# Patient Record
Sex: Male | Born: 1940
Health system: Southern US, Community
[De-identification: ages and names within clinical notes are randomized; demographics above are authoritative.]

## PROBLEM LIST (undated history)

## (undated) DIAGNOSIS — G4730 Sleep apnea, unspecified: Secondary | ICD-10-CM

## (undated) DIAGNOSIS — I10 Essential (primary) hypertension: Secondary | ICD-10-CM

## (undated) DIAGNOSIS — R0600 Dyspnea, unspecified: Secondary | ICD-10-CM

## (undated) DIAGNOSIS — K5792 Diverticulitis of intestine, part unspecified, without perforation or abscess without bleeding: Secondary | ICD-10-CM

## (undated) DIAGNOSIS — J45909 Unspecified asthma, uncomplicated: Secondary | ICD-10-CM

## (undated) DIAGNOSIS — E785 Hyperlipidemia, unspecified: Secondary | ICD-10-CM

## (undated) DIAGNOSIS — T7840XA Allergy, unspecified, initial encounter: Secondary | ICD-10-CM

## (undated) DIAGNOSIS — H269 Unspecified cataract: Secondary | ICD-10-CM

## (undated) DIAGNOSIS — K219 Gastro-esophageal reflux disease without esophagitis: Secondary | ICD-10-CM

## (undated) DIAGNOSIS — K4020 Bilateral inguinal hernia, without obstruction or gangrene, not specified as recurrent: Secondary | ICD-10-CM

## (undated) HISTORY — DX: Essential (primary) hypertension: I10

## (undated) HISTORY — PX: OTHER SURGICAL HISTORY: SHX169

## (undated) HISTORY — DX: Hyperlipidemia, unspecified: E78.5

## (undated) HISTORY — DX: Diverticulitis of intestine, part unspecified, without perforation or abscess without bleeding: K57.92

## (undated) HISTORY — DX: Unspecified cataract: H26.9

## (undated) HISTORY — DX: Unspecified asthma, uncomplicated: J45.909

## (undated) HISTORY — DX: Gastro-esophageal reflux disease without esophagitis: K21.9

## (undated) HISTORY — PX: PALATE / UVULA BIOPSY / EXCISION: SUR128

## (undated) HISTORY — DX: Allergy, unspecified, initial encounter: T78.40XA

## (undated) HISTORY — PX: HERNIA REPAIR: SHX51

## (undated) HISTORY — PX: TONSILLECTOMY: SUR1361

## (undated) HISTORY — PX: VASECTOMY: SHX75

---

## 2004-04-17 ENCOUNTER — Inpatient Hospital Stay (HOSPITAL_COMMUNITY): Admission: EM | Admit: 2004-04-17 | Discharge: 2004-04-21 | Payer: Self-pay | Admitting: Emergency Medicine

## 2004-04-17 ENCOUNTER — Ambulatory Visit: Payer: Self-pay | Admitting: Internal Medicine

## 2004-05-25 ENCOUNTER — Ambulatory Visit: Payer: Self-pay | Admitting: Internal Medicine

## 2004-06-09 ENCOUNTER — Ambulatory Visit (HOSPITAL_COMMUNITY): Admission: RE | Admit: 2004-06-09 | Discharge: 2004-06-09 | Payer: Self-pay | Admitting: Internal Medicine

## 2004-06-09 ENCOUNTER — Ambulatory Visit: Payer: Self-pay | Admitting: Internal Medicine

## 2004-06-09 HISTORY — PX: COLONOSCOPY: SHX174

## 2004-09-14 ENCOUNTER — Observation Stay (HOSPITAL_COMMUNITY): Admission: RE | Admit: 2004-09-14 | Discharge: 2004-09-15 | Payer: Self-pay | Admitting: General Surgery

## 2005-04-29 ENCOUNTER — Encounter: Admission: RE | Admit: 2005-04-29 | Discharge: 2005-04-29 | Payer: Self-pay | Admitting: Neurology

## 2005-07-23 ENCOUNTER — Emergency Department (HOSPITAL_COMMUNITY): Admission: EM | Admit: 2005-07-23 | Discharge: 2005-07-23 | Payer: Self-pay | Admitting: Emergency Medicine

## 2008-07-23 ENCOUNTER — Encounter: Payer: Self-pay | Admitting: Cardiovascular Disease

## 2008-07-23 ENCOUNTER — Ambulatory Visit: Payer: Self-pay | Admitting: Cardiovascular Disease

## 2008-07-23 DIAGNOSIS — T7840XA Allergy, unspecified, initial encounter: Secondary | ICD-10-CM | POA: Insufficient documentation

## 2008-07-23 DIAGNOSIS — J45909 Unspecified asthma, uncomplicated: Secondary | ICD-10-CM | POA: Insufficient documentation

## 2008-07-23 DIAGNOSIS — I4949 Other premature depolarization: Secondary | ICD-10-CM

## 2008-07-23 DIAGNOSIS — R079 Chest pain, unspecified: Secondary | ICD-10-CM

## 2008-07-23 DIAGNOSIS — J449 Chronic obstructive pulmonary disease, unspecified: Secondary | ICD-10-CM | POA: Insufficient documentation

## 2008-08-06 ENCOUNTER — Telehealth (INDEPENDENT_AMBULATORY_CARE_PROVIDER_SITE_OTHER): Payer: Self-pay | Admitting: *Deleted

## 2008-08-07 ENCOUNTER — Encounter: Payer: Self-pay | Admitting: Cardiovascular Disease

## 2008-08-07 ENCOUNTER — Ambulatory Visit: Payer: Self-pay

## 2008-08-13 ENCOUNTER — Telehealth: Payer: Self-pay | Admitting: Cardiovascular Disease

## 2008-08-18 DIAGNOSIS — G4733 Obstructive sleep apnea (adult) (pediatric): Secondary | ICD-10-CM | POA: Insufficient documentation

## 2008-08-18 DIAGNOSIS — E876 Hypokalemia: Secondary | ICD-10-CM | POA: Insufficient documentation

## 2008-08-18 DIAGNOSIS — M549 Dorsalgia, unspecified: Secondary | ICD-10-CM | POA: Insufficient documentation

## 2008-08-18 DIAGNOSIS — R0602 Shortness of breath: Secondary | ICD-10-CM | POA: Insufficient documentation

## 2008-08-18 DIAGNOSIS — K5730 Diverticulosis of large intestine without perforation or abscess without bleeding: Secondary | ICD-10-CM | POA: Insufficient documentation

## 2008-08-19 ENCOUNTER — Ambulatory Visit: Payer: Self-pay | Admitting: Cardiovascular Disease

## 2008-08-19 DIAGNOSIS — I10 Essential (primary) hypertension: Secondary | ICD-10-CM

## 2009-01-07 ENCOUNTER — Encounter (INDEPENDENT_AMBULATORY_CARE_PROVIDER_SITE_OTHER): Payer: Self-pay | Admitting: *Deleted

## 2010-08-20 NOTE — H&P (Signed)
NAMEMANDEL, SEIDEN               ACCOUNT NO.:  [id number]   MEDICAL RECORD NO.:  [id number]          PATIENT TYPE:  AMB   LOCATION:  DAY                           FACILITY:  [facility]   PHYSICIAN:  Jerolyn Shin C. Katrinka Blazing, M.D.   DATE OF BIRTH:  1940-12-18   DATE OF ADMISSION:  DATE OF DISCHARGE:  LH                                HISTORY & PHYSICAL   A 70 year old male with a history of bilateral inguinal pain and symptomatic  left inguinal swelling.  The patient is scheduled for left inguinal hernia  repair.  He is status post right inguinal hernia repair.   PAST MEDICAL HISTORY:  1.  Chronic low back pain.  2.  History of diverticulitis.  3.  Status post treatment with IV and oral antibiotics since January, 2006.   MEDICATIONS:  1.  Allegra-D 1 daily.  2.  Singulair 10 mg daily.  3.  Ibuprofen p.r.n.   PAST SURGICAL HISTORY:  Right inguinal hernia repair and umbilical hernia  repair.   PHYSICAL EXAMINATION:  VITAL SIGNS:  Blood pressure 140/90, pulse 80,  respirations 20.  Weight 188 pounds.  HEENT:  Unremarkable.  NECK:  Supple.  No JVD, bruits, adenopathy, or thyromegaly.  CHEST:  Clear to auscultation.  HEART:  Regular rate and rhythm without murmur, rub or gallop.  ABDOMEN:  Nontender.  No masses.  EXTREMITIES:  No clubbing, cyanosis or edema.  MUSCULOSKELETAL:  Hernia:  Tender left inguinal area with a positive left  inguinal hernia.  NEUROLOGIC:  No focal motor, sensory, or cerebellar deficits.   IMPRESSION:  Left inguinal hernia.   PLAN:  Left inguinal hernia repair.     LCS/MEDQ  D:  09/13/2004  T:  09/13/2004  Job:  [id number]

## 2010-08-20 NOTE — Op Note (Signed)
Patrick, Bell               ACCOUNT NO.:  [id number]   MEDICAL RECORD NO.:  [id number]          PATIENT TYPE:  AMB   LOCATION:  DAY                           FACILITY:  [facility]   PHYSICIAN:  Lionel December, M.D.    DATE OF BIRTH:  05-Oct-1940   DATE OF PROCEDURE:  06/09/2004  DATE OF DISCHARGE:                                 OPERATIVE REPORT   PROCEDURE:  Total colonoscopy.   INDICATION:  Patrick Bell is a 70 year old Caucasian male who was hospitalized  about two months ago with lower abdominal pain and CT evidence of  diverticulitis. There was also a question of C diff colitis. He was treated  with antibiotics and has improved. He is now returning with colonoscopy to  make sure he does not have any neoplasm. He does give history of colon polyp  that was apparently not removed. This procedure was done in [place], [place]. Presently he is complaining of intermittent pain, right lower  quadrant of abdomen. He does have a right inguinal hernia, but this pain  does not appear to be associated with it.   Procedure risks were reviewed the patient, and informed consent was  obtained.   PREMEDICATION:  Demerol 40 mg IV Versed 4 mg IV.   FINDINGS:  Procedure performed in endoscopy suite. The patient's vital signs  and O2 saturation were monitored during procedure and remained stable. The  patient was placed in left lateral position. Rectal examination performed.  No abnormality noted on external or digital exam. Olympus video scope was  placed in rectum and advanced under vision into sigmoid colon and beyond.  Preparation was satisfactory. He had scattered small to medium sized  diverticula at sigmoid colon. Scope was advanced into cecum which was  identified by appendiceal orifice and ileocecal valve. Pictures taken for  the record. Short segment of TI was also examined and was normal. Colonic  mucosa was examined for the second time on the way out, and there were no  polyps,  tumor masses or colonic stricture noted. Rectal mucosa similarly was  normal. Scope was retroflexed to examine anorectal junction which was  unremarkable. Endoscope was straightened and withdrawn.   The patient tolerated procedure well.   FINAL DIAGNOSIS:  1.  Sigmoid colon diverticulosis, otherwise normal colonoscopy.  2.  Normal terminal ileoscopy.   RECOMMENDATIONS:  1.  He will continue high fiber diet. Take FiberChoice 2 tablets q.d. or      Citrucel 1 tablespoonful daily.  2.  Levsin SL t.i.d. p.r.n. prescription given for 30 with 1 refill.      NR/MEDQ  D:  06/09/2004  T:  06/09/2004  Job:  [id number]   cc:   Magnus Sinning. Dimple Casey, M.D.  [address] [place]  [place] [address]  Fax: [phone]

## 2010-08-20 NOTE — H&P (Signed)
[name], [name]               ACCOUNT NO.:  [id number]   MEDICAL RECORD NO.:  [id number]          PATIENT TYPE:  INP   LOCATION:  A332                          FACILITY:  [facility]   PHYSICIAN:  Margaretmary Dys, M.D.DATE OF BIRTH:  26-Jul-1940   DATE OF ADMISSION:  04/17/2004  DATE OF DISCHARGE:  LH                                HISTORY & PHYSICAL   ADMISSION DIAGNOSES:  1.  Acute abdominal pain.  2.  Acute diverticulitis.  3.  Hypokalemia.   CHIEF COMPLAINT:  Lower abdominal pain.   HISTORY OF THE PRESENT ILLNESS:  [name] is a 70 year old  Caucasian male with a history of hernia repair about 2.5 years ago who  presents to the emergency room with a four-day history of lower abdominal  pain.  He describes the pain as colicky, intermittent in nature and worse in  his left lower quadrant.  The pain appears to be relieved spontaneously  without any aggravating or relieving factors.  The pain also radiates to his  suprapubic and right lower quadrant.   The patient did have a hernia repair 2.5 years ago and he thought this was a  recurrence.  Actually evaluation in the emergency room did show that the  patient has a reducible inguinal hernia.  He denies any fever, chills or  rigors.  He describes the pain as about 5/10 at its worse and at the time I  spoke with him he stated the pain was down to 2/10.  He has had no nausea or  vomiting.  He states, however, that he has diarrhea suggesting that his  stools are never really well-formed.  He denies any significant  constipation.  He has no frequency, urgency or dysuria .  No headache,  dizziness or lightheadedness.  Denies any shortness of breath.  No chest  pain.  No palpitations.  He is mostly healthy and does not taken any chronic  medications.  He initially thought the pain was related to his hernia.   The patient tells that the last time he had a herniorrhaphy it was not  painful and reveals no history of  strangulation or obstruction at the time.  He has never had an episode of diverticulitis.   Evaluation in the emergency room with a CT scan of his abdomen and pelvis  did reveal the patient to have acute diverticulitis.   REVIEW OF SYSTEMS:  A ten point review of systems is otherwise negative,  except as mentioned in the history of present illness above.   PAST MEDICAL HISTORY:  1.  History of herniorrhaphy.  2.  Allergies.  3.  Asthma.  4.  Sleep apnea.   MEDICATIONS:  1.  Motrin p.r.n.  2.  Singulair.  3.  Allegra D.  4.  Albuterol inhaler.   ALLERGIES:  The patient has no known drug allergies.   FAMILY HISTORY:  Father died in an accident, but he did have an MI at the  age of 64.  Mother is alive at 68 and doing very well.  No other significant  family history for disease noted.  SOCIAL HISTORY:  The patient quit smoking years ago.  He does not drink  alcohol, except on rare occasions.  He denies any illicit drug use.  The  patient is married and has 74 year old son who is out the home.  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   PHYSICAL EXAMINATION:  GENERAL APPEARANCE:  Conscious, alert, comfortable  and very pleasant elderly Caucasian male who appears younger than his age.  VITAL SIGNS:  Blood pressure is 144/77, pulse is 97, respiratory rate of 18  and temperature is 98.6.  He weighs 187 pounds.  HEENT EXAMINATION:  Normocephalic and atraumatic.  Oral mucosa is moist with  no exudates.  NECK:  The neck is supple.  No JVD or lymphadenopathy.  LUNGS:  The lungs are clear clinically with good air entry bilaterally.  HEART:  S1 and S2 regular.  No S3, S4, gallops or rubs.  ABDOMEN:  The abdomen is soft and not distended.  The patient has tenderness  in the left lower quadrant.  There is no rebound.  There is no guarding or  rigidity noted.  Bowel sounds are positive.  The patient does have a  reducible right inguinal hernia.  EXTREMITIES:  No pitting pedal edema.   No calf induration or tenderness is  noted.  NEUROLOGIC EXAMINATION:  CNS exam is grossly intact with no focal deficit.   LABORATORY DATA:  White blood cell count was 14.8, hemoglobin of 16,  hematocrit 47.3 and platelet count was 311,000 with left shift, neutrophils  of 85%.  Sodium is 137, potassium 3.3, chloride of 97, carbon dioxide 27,  glucose of 87, BUN 10, creatinine 0.9, and calcium ____________.  Total  protein 6.6, albumin is 3.8.  AST of 6 and ALT of 15.  Total bilirubin is  1.3.  Lipase of 17.  Urinalysis was negative.  Urine microscopy was also  negative.  CT of the abdomen as per the report I obtained Dr. Margretta Ditty was  that the patient has acute diverticulitis.  I do not have the official  report yet.   ASSESSMENT AND PLAN:  [age]-year-old Caucasian male with left lower  quadrant abdominal pain, leukocytosis and computerized tomographic findings  suggestive of acute diverticulitis.   Plan:  1.  We will admit him to 3A at this time.  2.  We will continue with ciprofloxacin 400 mg IV q.12 and Flagyl 500 mg      q.8.  3.  The patient does not have any signs or symptoms suggestive of an acute      abdomen requiring surgical intervention at this time, but we will      continued to monitor him.  I have informed him of the possible risks      associated with continued inflammation.  It does appear the patient is      doing much better.  He also does not have any significant comorbid      factors that may make his recovery difficult.  4.  I anticipate that patient may have intravenous antibiotics for two to      three days and subsequently will be      discharged for close follow up on oral antibiotics.  5.  The patient will need surgical evaluation for his hernia when his      infection clears up completely.  6.  I will correct the potassium.     Ayor   AM/MEDQ  D:  04/17/2004  T:  04/18/2004  Job:  [id number]

## 2010-08-20 NOTE — Consult Note (Signed)
Patrick Bell, Patrick Bell               ACCOUNT NO.:  [id number]   MEDICAL RECORD NO.:  [id number]          PATIENT TYPE:  INP   LOCATION:  A332                          FACILITY:  [facility]   PHYSICIAN:  Dirk Dress. Katrinka Blazing, M.D.   DATE OF BIRTH:  01-Sep-1940   DATE OF CONSULTATION:  04/18/2004  DATE OF DISCHARGE:                                   CONSULTATION   A 70 year old male admitted on 14 January with complaint of left lower  quadrant and suprapubic pain.  By history, the patient states that he has  had intermittent lower abdominal pain for at least three weeks.  He states  that he stretched, had some discomfort in his suprapubic area and slightly  to the right of midline about three weeks ago.  He also had symptoms of  tumescence at that time.  He noted some urinary frequency without urgency.  The patient's symptoms became much worse on 14 January.  He was seen in the  outpatient clinic at [facility] and was felt to have  possible right inguinal hernia or appendicitis.  He was referred to the  emergency room for evaluation, and it was noted that he had a tender lower  abdomen, especially in the suprapubic area and the left lower quadrant.  CT  of the abdomen confirms diverticulitis of the sigmoid colon without evidence  of abscess, free air, or major mesenteric inflammation.  The patient has  been started on treatment, and he has improved.  He is concerned that he is  going to have significant hospitalization, but he is advised that non-  operative treatment with antibiotics is the preferred treatment for his  first major episode in spite of the fact that he might have had some small  episodes that were treated with oral antibiotics in the past.   PAST MEDICAL HISTORY:  The patient has asthma, sleep apnea.   MEDICATIONS:  His only medications are Motrin p.r.n. for pain,  Allegra D  b.i.d. p.r.n., Singulair 10 mg daily, and albuterol inhaler.   PAST SURGICAL  HISTORY:  Left inguinal hernia repair in the past.  He has had  intermittent pain in his right groin with swelling in his right groin for  quite some time.  He has a documented right inguinal hernia.   SOCIAL HISTORY:  He is married.  He is still employed by a trucking company.  He has two children.  He does not drink or smoke at this time.   FAMILY HISTORY:  Positive for atherosclerotic heart disease.  His mother is  alive at age 73 without any major medical illnesses.   PHYSICAL EXAMINATION:  GENERAL:  He is a pleasant male in no acute distress.  VITAL SIGNS:  Blood pressure 130/90, pulse 80, respirations 16, temperature  99.7.  HEENT:  Unremarkable.  NECK:  Supple.  CHEST:  Clear.  HEART:  Regular rate and rhythm without murmur, gallop, or rub.  ABDOMEN:  Nondistended with moderate tenderness to palpation in the  suprapubic and left lower quadrant.  There is some mild rebound tenderness  in the  right lower quadrant, but this is more referred discomfort.  He has  good, active bowel sounds.  HERNIA:  There is tenderness in the right inguinal area, but there is no  palpable hernia, though he has increased bulge in the right inguinal area  with cough.  EXTREMITIES:  No cyanosis, clubbing, or edema.  NEUROLOGIC:  No focal motor, sensory, or cerebellar deficits.   LABORATORY DATA AND OTHER STUDIES:  CT scan shows inflammation of the mid to  distal sigmoid colon with thickening of the wall of the colon without  evidence of major mesenteric inflammation and without evidence of  significant adenopathy.  There does not appear to be any free fluid that I  can see, and the radiologist does not mention any.   Initial white count 14,800, hemoglobin 16, hematocrit 47.3, 85% segs, 7%  lymphs.  Serum potassium 3.3, BUN 10, creatinine 0.9, CO2 27.  Liver panel  is normal.  Lipase is normal.  Urinalysis negative for infection or  bacteria.   IMPRESSION:  1.  Acute sigmoid diverticulitis,  presently controlled.  2.  Mild hypokalemia.  3.  Right inguinal hernia.   PLAN:  1.  The patient should be treated with minimum of 4 days with IV antibiotics      to ensure the start of good resolution of the inflammatory process.  2.  A CT scan should be done on the third or fourth day to confirm      improvement.  3.  If improvement is confirmed, he should be discharged home and treated      for two to three weeks with oral antibiotics and should have a followup      CT in six weeks.  4.  If he does well with this acute attack of diverticulitis, then we can      proceed and have his right inguinal hernia repaired electively.  5.  If his symptoms should worsen over the next day or so, he should have an      urgent CT and have urgent operation because this will mean that he has      progression of the inflammatory process.     Lero   LCS/MEDQ  D:  04/18/2004  T:  04/18/2004  Job:  [id number]   cc:   [facility]

## 2010-08-20 NOTE — Group Therapy Note (Signed)
Patrick Bell, Patrick Bell               ACCOUNT NO.:  [id number]   MEDICAL RECORD NO.:  [id number]          PATIENT TYPE:  INP   LOCATION:  A332                          FACILITY:  [facility]   PHYSICIAN:  Margaretmary Dys, M.D.DATE OF BIRTH:  May 14, 1940   DATE OF PROCEDURE:  04/18/2004  DATE OF DISCHARGE:                                   PROGRESS NOTE   SUBJECTIVE:  The patient feels much better this morning.  He says the pain  is better controlled, although he still rates it about 6-7/10, especially  when he tries to sit up.  He denies any nausea or vomiting.  The patient was  actually preparing to eat.  He has no fever, chills, or rigors.  No  shortness of breath, no chest pain.   OBJECTIVE:  GENERAL:  The patient is a lot more comfortable, not in acute  distress.  VITAL SIGNS:  Blood pressure is 136/85, pulse of 75, respirations of 20,  temperature of 97.4.  HEENT:  Normocephalic, atraumatic.  Oral mucosa was moist with no exudates.  NECK:  Supple, no JVD, no lymphadenopathy.  LUNGS:  Clear clinically, with good air entry bilaterally.  HEART:  S1, S2 regular.  No S3, S4, gallops or rubs.  ABDOMEN:  The patient still has tenderness in the left lower quadrant;  otherwise was mostly soft.  No rigidity, no guarding.  Bowel sounds were  positive.  EXTREMITIES:  No pitting pedal edema, no calf induration or tenderness.  CENTRAL NERVOUS SYSTEM:  Grossly intact with no focal deficits.   LABORATORY DATA:  White blood cell count is 7.9, hemoglobin 13.9, hematocrit  40.7, platelet count is 257.  There is no left shift.  Sodium is 131,  potassium is 3.8, chloride of 103, CO2 is 23.  BUN of 7, creatinine is 0.8.   ASSESSMENT AND PLAN:  Patrick Bell was admitted yesterday with a  diagnosis of acute diverticulitis, found on a CT scan of his abdomen.  The  official report is still pending.  The patient appears to be much better  this morning.  He is tolerating p.o. without significant  exacerbation of his  pain.  We will continue him on intravenous ciprofloxacin and metronidazole  for now.  Continue deep venous thrombosis prophylaxis and gastrointestinal  prophylaxis.   We will obtain surgical consult so the patient can establish some kind of  followup.  The patient did inform me that he actually had a similar episode  about three months ago, and was told that it was probably the same  diverticulitis.  He received two weeks of oral antibiotics, but never went  back for a followup.  So, the patient has had two episodes in the last three  months.  He might need further evaluation with a colonoscopy if he has  recurrent episodes.   We will switch his IV fluids to [facility] this morning.   I anticipate discharge in the next 1-3 days.     Ayor   AM/MEDQ  D:  04/18/2004  T:  04/18/2004  Job:  [id number]

## 2010-08-20 NOTE — Op Note (Signed)
NAMEIGNATZ, DEIS               ACCOUNT NO.:  [id number]   MEDICAL RECORD NO.:  [id number]          PATIENT TYPE:  AMB   LOCATION:  DAY                           FACILITY:  [facility]   PHYSICIAN:  Jerolyn Shin C. Katrinka Blazing, M.D.   DATE OF BIRTH:  June 02, 1940   DATE OF PROCEDURE:  09/14/2004  DATE OF DISCHARGE:                                 OPERATIVE REPORT   PREOPERATIVE DIAGNOSIS:  Left inguinal hernia.   POSTOPERATIVE DIAGNOSIS:  Left inguinal hernia.   OPERATION PERFORMED:  Left inguinal hernia repair with mesh.   SURGEON:  Dirk Dress. Katrinka Blazing, M.D.   ANESTHESIA:  General.   DESCRIPTION OF PROCEDURE:  Under general anesthesia, the patient's inguinal  area and genitalia were prepped and draped in a sterile field.  A  curvilinear lower inguinal incision was made.  The incision extended through  to the aponeurosis of the external oblique.  The aponeurosis was opened in  line with its fibers.  The cord was mobilized from the floor.  Exploration  of the cord revealed a small indirect sac.  This was dissected back to the  internal ring and invaginated.  A medium plug was placed.  The plug was  sutured to the surrounding tissue with interrupted 0 Prolene.  An onlay  patch was then placed over the floor.  The onlay patch, how was used as a  primary repair with sutures starting at the pubic tubercle extending down to  Cooper's ligament and onto the iliopubic tract followed by suture to the  conjoined tendon.  This was done with 0 Prolene.  Because part of the direct  component pushed through the opening in the onlay patch, another medium plug  was placed to invert the direct component.  This was sutured to the  surrounding tissue and to the onlay patch to prevent herniation in this  area.  The ilioinguinal and iliohypogastric nerves were identified and  preserved during the procedure.  The cord was placed in anatomic position.  The aponeurosis was closed with 3-0 Monocryl.  Subcutaneous tissue was  closed with 3-0 Monocryl.  Skin  was closed with subcuticular 4-0 Dexon.  Local infiltration with 0.5%  Marcaine with epinephrine was carried out.  Dressing was placed.  The  patient tolerated the procedure well.  He was awakened from anesthesia,  transferred to a bed and taken to the post anesthesia care unit for further  monitoring.       LCS/MEDQ  D:  09/14/2004  T:  09/14/2004  Job:  [id number]   cc:   Magnus Sinning. Dimple Casey, M.D.  [address] [place]  [place] [address]  Fax: [phone]

## 2010-08-20 NOTE — Consult Note (Signed)
NAMEFLORA, Patrick Bell               ACCOUNT NO.:  [id number]   MEDICAL RECORD NO.:  [id number]          PATIENT TYPE:  INP   LOCATION:  A332                          FACILITY:  [facility]   PHYSICIAN:  Lionel December, M.D.    DATE OF BIRTH:  09-28-1940   DATE OF CONSULTATION:  04/20/2004  DATE OF DISCHARGE:                                   CONSULTATION   REASON FOR CONSULTATION:  Diverticulitis, diarrhea.   REQUESTING PHYSICIAN:  Calvert Cantor, M.D.   HISTORY OF PRESENT ILLNESS:  The patient is a 70 year old Caucasian  gentleman who gives a two to three-week history of lower abdominal pain.  The pain has been described as colicky.  Over the last week, it had been  more progressive.  The day prior to admission, he noted increased pain.  He  saw his primary care physician the day of admission who advised him to go to  the ED.  It was felt that pain may be related to hernia.  He does have a  history of right inguinal hernia repair and developed another right inguinal  hernia near the previous incision.   Upon arrival, he had a CT of the abdomen and pelvis which revealed wall  thickening of the proximal sigmoid colon with adjacent inflammation  consistent with diverticulitis.  He had a white count of 14,800.  It was  felt his symptoms were due to diverticulitis.  He was placed on IV Cipro and  Flagyl.  Since admission, he has notably had diarrhea.  He has had 8 to 9  stools daily.  Stools are loose, watery, and very dark in appearance.  He  has been hemoccult negative x 1.  Yesterday he switched to Unasyn from IV  Cipro and IV Flagyl as it was felt this may be causing his diarrhea.   Clinically, he has improved.  He was started on advanced diet and has  tolerated that.  He denies any rectal bleeding, nausea or vomiting,  heartburn.  He denies any prior episodes of diverticulitis.  He does not if  he has diverticulosis.  He had a colonoscopy a couple of years ago, but only  the only thing he  recalls is that he had a polyp removed.  At baseline, he  typically has two to three soft, mucosy stools daily.  He has never had  constipation.  He has had fecal urgency for about 10 to 12 years.  He  reports antibiotic use two to three months ago for a urological infection.   MEDICATIONS PRIOR TO ADMISSION:  1.  Motrin 8 daily.  2.  Singulair.  3.  Allegra D.  4.  Albuterol inhaler p.r.n.   ALLERGIES:  No known drug allergies.   PAST MEDICAL HISTORY:  1.  History of sleep apnea status post removal of uvula.  He does not      require a CPAP machine.  2.  History of asthma.  3.  Right inguinal hernia repair a couple of years ago; he has another right      inguinal hernia.  4.  He also  had umbilical hernia repair eight years ago.  5.  Tonsillectomy.   FAMILY HISTORY:  Negative for colorectal cancer or chronic GI illnesses.   SOCIAL HISTORY:  He is married and has a son.  He is a Naval architect.  He  quit smoking 20 years ago.  Denies any alcohol or drug use.   REVIEW OF SYSTEMS:  See HPI for GI.  GU:  Denies any dysuria or hematuria.  CARDIOPULMONARY:  Denies any chest pain or shortness of breath.  CONSTITUTIONAL:  Denies any weight loss.   PHYSICAL EXAMINATION:  VITAL SIGNS:  Temperature 97, pulse 84, respirations  20, blood pressure 154/73.  GENERAL:  Pleasant, well-nourished, well-developed, Caucasian male in no  acute distress.  SKIN:  Warm and dry, no jaundice.  HEENT:  Conjunctivae pink.  Sclerae nonicteric.  Oropharyngeal mucosa moist  and pink, no lesions, erythema, or exudate.  NECK:  No lymphadenopathy or thyromegaly.  CHEST: Clear to auscultation.  CARDIAC:  Regular rate and rhythm.  Normal S1 and S2.  No murmurs, rubs, or  gallops.  ABDOMEN:  Positive bowel sounds, hyperactive.  Abdomen is very soft and  nondistended.  He has mild suprapubic tenderness to deep palpation in the  left lower quadrant region.  No rebound tenderness or guarding.  No  abdominal  herniation appreciated.  EXTREMITIES:  No edema.   LABORATORY DATA AND OTHER STUDIES:  Labs from yesterday:  White count 8600,  hemoglobin 13.9, hematocrit 40.6, platelets 277,000.  Sodium 131, potassium  3.4, BUN 3, creatinine 0.8, glucose 92.  On admission, total bilirubin was  1.3, slightly elevated, alkaline phosphatase 75, AST 16, ALT 15, albumin  3.8.  Lipase 17.   IMPRESSION:  The patient is a 70 year old gentleman with lower abdominal  pain, diarrhea.  Based on CT, has evidence of wall thickening of the  proximal sigmoid colon with adjacent inflammation.  Typically, do not see  diarrhea in association with acute diverticulitis.  Would question if this  is diverticulitis with superimposed colitis.  C. difficile toxin titer is  pending as well as fecal white blood cells.   The patient is clinically improved at this point with regards to abdominal  pain, although diarrhea is quite persistent.  Would recommend oral Cipro and  Flagyl for two weeks at this point.  As long as he clinically continues to  improve, would consider colonoscopy in four to six weeks to verify  resolution of symptoms and to make sure there are no other underlying  illnesses.   PLAN:  1.  Will switch his antibiotics to oral Flagyl and start the Flagyl 250 mg      p.o. four times a day and Cipro 500 mg p.o. b.i.d.  Will discontinue      Unasyn.  2.  Will try to obtain copy of colonoscopy report and biopsy results from      [facility] in [place], [place], which was done approximately two years ago.  3.  Will repeat MET-7, CBC, and LFTs in the morning.  4.  Dr. Karilyn Cota has reviewed CT and will be seeing patient and making further      recommendations.      LL/MEDQ  D:  04/20/2004  T:  04/20/2004  Job:  [id number]   cc:   Calvert Cantor, M.D.

## 2010-08-20 NOTE — H&P (Signed)
Patrick Bell, Patrick Bell               ACCOUNT NO.:  [id number]   MEDICAL RECORD NO.:  [id number]          PATIENT TYPE:  AMB   LOCATION:                                 FACILITY:   PHYSICIAN:  Lionel December, M.D.    DATE OF BIRTH:  02-08-1941   DATE OF ADMISSION:  DATE OF DISCHARGE:  LH                                HISTORY & PHYSICAL   REASON:  Colonoscopy. Followup diverticulitis/clostridium difficile colitis.   HISTORY OF PRESENT ILLNESS:  Patrick Bell is a 70 year old Caucasian male  patient who was hospitalized on April 20, 2004. At that time, he had lower  abdominal pain and diarrhea. CT showed evidence of wall thickening at the  proximal sigmoid colon with adjacent inflammation compatible with  diverticulitis. The patient was treated with Cipro and Flagyl and  subsequently developed mucosy diarrhea. Antibiotic was then changed to  Unasyn at that point as it was felt that he may have developed clostridium  difficile or pseudomembranous colitis. He was then treated with two weeks of  Cipro and Flagyl as well as yogurt with each meal. He has continued to do  well since hospital discharge. He is noting soft, semiformed stools about  twice a day. He denies any abdominal pain, fever, or chills. He denies any  rectal bleeding or melena. He denies any nausea or vomiting. He also notes  history of taking about 2,400 mg of ibuprofen on a daily basis for the last  year and a half prior to his hospitalization. It was noted that he had  colonoscopy between two and three years ago in [place]. We have been unable  to obtain this report. He reportedly has had a polyp removed, unsure of the  pathology report on this. He denies any history of diverticulitis in the  past.   PAST MEDICAL HISTORY:  1.  Sleep apnea status post removal of uvula.  2.  Asthma.  3.  Right inguinal hernia repair.  4.  Umbilical hernia repair.  5.  Tonsillectomy.   CURRENT MEDICATIONS:  1.  Allegra D once daily.  2.  Singulair daily.   ALLERGIES:  No known drug allergies.   FAMILY HISTORY:  Negative for colorectal carcinoma, liver, or chronic GI  problems.   SOCIAL HISTORY:  Patrick Bell is married and does have one son. 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 Denies any alcohol or drug use.   REVIEW OF SYSTEMS:  CONSTITUTIONAL:  See HPI. CARDIOVASCULAR:  Denies any  chest pain or palpitations. PULMONARY:  Denies any shortness of breath,  dyspnea, cough, or hemoptysis. GASTROINTESTINAL:  See HPI.   PHYSICAL EXAMINATION:  VITAL SIGNS:  Weight 191.5 pounds which is stable,  temperature 97.6, blood pressure 150/100, pulse 88.  GENERAL:  Patrick Bell is a well-developed, well-nourished, Caucasian male in  no acute distress. He is alert, oriented, pleasant, and cooperative.  HEENT:  Sclerae are clear and nonicteric. Conjunctivae pink. Oropharynx  moist without any lesions.  NECK:  Supple without masses or thyromegaly.  CHEST:  Heart regular rate and rhythm with  normal S1 and S2 without any  murmurs, clicks, rubs, or gallops.  LUNGS:  Clear to auscultation bilaterally.  ABDOMEN:  Positive bowel sounds x4. Soft, nontender, nondistended, without  palpable mass or hepatosplenomegaly. No rebound tenderness or guarding.  EXTREMITIES:  2+ pedal pulses bilaterally. No edema.  SKIN:  Pink, warm, and dry without rash or jaundice.   IMPRESSION:  Patrick Bell is a 70 year old Caucasian male with recent episode  of acute wall thickening of the proximal sigmoid colon with adjacent  inflammation suspicious for acute diverticulitis. Post antibiotic treatment,  he continued to develop diarrhea which was felt to be antibiotic induced,  possibly clostridium difficile colitis. He has completed his course of Cipro  and Flagyl and is doing quite well. Now that antibiotic therapy is completed  and he is feeling much better, it would be appropriate to obtain a survey of  his colon to determine whether we are  dealing with a bout of diverticulitis  and whether this has resolved versus colitis, pseudomembranous colitis or  idiopathic colitis. Also, given his history, he is at risk for NSAID-induced  colitis as well.   RECOMMENDATIONS:  1.  He can use Tylenol instead of ibuprofen at this time.  2.  Will schedule colonoscopy with Dr. Karilyn Cota in the next couple of weeks.  3.  Further recommendations pending procedure. Also, he is asked to inform      our office if he develops fever, rectal bleeding, severe abdominal pain,      or any new signs or symptoms.      KC/MEDQ  D:  05/25/2004  T:  05/25/2004  Job:  [id number]

## 2010-08-20 NOTE — Discharge Summary (Signed)
NAMEDAMONTE, FRIESON               ACCOUNT NO.:  [id number]   MEDICAL RECORD NO.:  [id number]          PATIENT TYPE:  INP   LOCATION:  A332                          FACILITY:  [facility]   PHYSICIAN:  Calvert Cantor, M.D.     DATE OF BIRTH:  [birth year]   DATE OF ADMISSION:  04/17/2004  DATE OF DISCHARGE:  01/18/2006LH                                 DISCHARGE SUMMARY   PRIMARY CARE PHYSICIAN:  The patient sees a P.A. by the name of Montey Hora  at [facility].   DISCHARGE DIAGNOSES:  1.  Diverticulitis.  2.  Diarrhea with mucus, most likely secondary to Clostridium difficile.  3.  Right-sided inguinal hernia, status post right-sided inguinal hernia      repair.   PAST MEDICAL HISTORY:  1.  Asthma.  2.  Sleep apnea, status post removal of his uvula.  3.  History of umbilical hernia repair.  4.  History of tonsillectomy.   DISCHARGE MEDICATIONS:  1.  Flagyl 250 mg by mouth every six hours.  2.  Ciprofloxacin 500 mg by mouth every 12 hours for duration of two weeks.   HOSPITAL COURSE:  The patient is a 70 year old white male who came into the  hospital for increasing lower abdominal pain.  He had had this pain for  about two or three weeks.  The day before admission, it became very severe  and colicky.  In the emergency room, a CAT scan of his abdomen was done  which showed proximal sigmoid colon wall thickening and adjacent  inflammation compatible with diverticulitis and also mild prostate  enlargement.  The patient was initially treated with ciprofloxacin and  Flagyl.  Subsequently he developed mucousy diarrhea.  Antibiotics were  changed to Unasyn because it was felt that the antibiotics might be causing  his diarrhea.  Stool was sent for Clostridium difficile, white blood cells  and occult blood which were all negative.  However, his diarrhea is most  likely secondary to Clostridium difficile.  The stool culture is most likely  negative because of his  previous treatment with Flagyl.  He is going to be  switched back to Cipro and Flagyl for two weeks' duration and he is to see  Dr. Dionicia Abler after two weeks. Dr. Dionicia Abler is also advising one packet of yogurt  with each meal and a low residue diet. In addition to this, he is to drink  about six to eight glasses of water because he is still having some  diarrhea.  He is also to follow up with Dr. Katrinka Blazing, who had been seeing him  while he was here, for surgery of his right inguinal hernia.    Saim  SR/MEDQ  D:  04/21/2004  T:  04/21/2004  Job:  [id number]

## 2011-01-06 ENCOUNTER — Other Ambulatory Visit: Payer: Self-pay | Admitting: Family Medicine

## 2011-01-06 DIAGNOSIS — R413 Other amnesia: Secondary | ICD-10-CM

## 2011-01-07 ENCOUNTER — Other Ambulatory Visit: Payer: Self-pay

## 2011-01-27 ENCOUNTER — Ambulatory Visit
Admission: RE | Admit: 2011-01-27 | Discharge: 2011-01-27 | Disposition: A | Discharge status: Home / Self Care | Payer: Managed Care, Other (non HMO) | Source: Ambulatory Visit | Attending: Family Medicine | Admitting: Family Medicine

## 2011-01-27 DIAGNOSIS — R413 Other amnesia: Secondary | ICD-10-CM

## 2012-06-18 ENCOUNTER — Other Ambulatory Visit: Payer: Self-pay | Admitting: Physician Assistant

## 2012-06-19 LAB — COMPLETE METABOLIC PANEL WITH GFR
ALT: 13 U/L (ref 0–53)
AST: 14 U/L (ref 0–37)
CO2: 28 mEq/L (ref 19–32)
Creat: 0.91 mg/dL (ref 0.50–1.35)
GFR, Est African American: >89 mL/min
Total Bilirubin: 0.8 mg/dL (ref 0.3–1.2)

## 2012-06-19 LAB — VITAMIN D 25 HYDROXY (VIT D DEFICIENCY, FRACTURES): Vit D, 25-Hydroxy: 24 ng/mL — ABNORMAL LOW (ref 30–89)

## 2012-06-21 LAB — NMR LIPOPROFILE WITH LIPIDS
HDL Particle Number: 37.2 umol/L (ref >=30.5)
HDL-C: 62 mg/dL (ref >=40)
LDL (calc): 86 mg/dL (ref <100)
LDL Particle Number: 1199 nmol/L — ABNORMAL HIGH (ref <1000)
LDL Size: 20.5 nm — ABNORMAL LOW (ref >20.5)
LP-IR Score: <25 (ref <=45)
VLDL Size: 39.4 nm (ref >=46.6)

## 2012-06-22 ENCOUNTER — Ambulatory Visit (INDEPENDENT_AMBULATORY_CARE_PROVIDER_SITE_OTHER): Payer: Medicare Other | Admitting: Nurse Practitioner

## 2012-06-22 ENCOUNTER — Telehealth: Payer: Self-pay | Admitting: Physician Assistant

## 2012-06-22 ENCOUNTER — Encounter: Payer: Self-pay | Admitting: Nurse Practitioner

## 2012-06-22 VITALS — BP 127/74 | HR 85 | Temp 97.6°F | Ht 69.0 in | Wt 189.0 lb

## 2012-06-22 DIAGNOSIS — J069 Acute upper respiratory infection, unspecified: Secondary | ICD-10-CM

## 2012-06-22 MED ORDER — AMOXICILLIN 875 MG PO TABS: 875.0000 mg | ORAL_TABLET | Freq: Two times a day (BID) | ORAL | Status: DC

## 2012-06-22 NOTE — Patient Instructions (Signed)

## 2012-06-22 NOTE — Progress Notes (Signed)
  Subjective:    Patient ID: Patrick Bell, male    DOB: 07-30-1940, 72 y.o.   MRN: [id number]  HPI Patient in complaining of cough and congestion . Started 4day. Has gotten worse since started. Associated symptoms include Ears stopped up and  occassional headache. He has tried Sudafed and cold meds OTC without relief.     Review of Systems  Constitutional: Negative.   HENT: Positive for hearing loss (Right), congestion, rhinorrhea, voice change (hoarse), postnasal drip and sinus pressure.   Eyes: Negative.   Respiratory: Positive for cough (productive yellowish) and shortness of breath. Negative for wheezing.   Cardiovascular: Negative.   Skin: Negative.   Psychiatric/Behavioral: Negative.        Objective:   Physical Exam  Vitals reviewed. Constitutional: He appears well-developed and well-nourished.  HENT:  Right Ear: Hearing, external ear and ear canal normal. Tympanic membrane is erythematous and bulging. A middle ear effusion is present.  Left Ear: Hearing, tympanic membrane, external ear and ear canal normal. Tympanic membrane is not erythematous and not bulging.  Nose: Mucosal edema and rhinorrhea present. Right sinus exhibits no maxillary sinus tenderness and no frontal sinus tenderness. Left sinus exhibits no maxillary sinus tenderness and no frontal sinus tenderness.  Mouth/Throat: Mucous membranes are pale and dry. Posterior oropharyngeal edema and posterior oropharyngeal erythema present.  Eyes: Pupils are equal, round, and reactive to light.  Neck: Normal range of motion. Neck supple.  Cardiovascular: Normal rate, regular rhythm and normal heart sounds.   Pulmonary/Chest: Effort normal.  Skin: Skin is warm and dry.  BP 127/74  Pulse 85  Temp(Src) 97.6 F (36.4 C) (Oral)  Ht 5\' 9"  (1.753 m)  Wt 189 lb (85.73 kg)  BMI 27.9 kg/m2         Assessment & Plan:  URI  Force fluids  Rest  Motrin or tylenol OTC fever  Antibiotic as rx  Mucinex D  OTC  Mary-Margaret Daphine Deutscher, FNP

## 2012-06-22 NOTE — Telephone Encounter (Signed)
appt made

## 2012-06-22 NOTE — Progress Notes (Signed)
Quick Note:  Labs were within normal limits ______ 

## 2012-06-22 NOTE — Telephone Encounter (Signed)
WTBS today °

## 2012-06-25 ENCOUNTER — Telehealth: Payer: Self-pay | Admitting: *Deleted

## 2012-06-25 NOTE — Telephone Encounter (Signed)
Message copied by Gwenith Daily on Mon Jun 25, 2012  8:20 AM ------      Message from: Horald Pollen      Created: Fri Jun 22, 2012  8:19 AM       Vit D 2000 iu per day ------

## 2012-06-25 NOTE — Telephone Encounter (Signed)
Left message for patient to return call about lab results ?

## 2012-06-25 NOTE — Telephone Encounter (Signed)
Message copied by Gwenith Daily on Mon Jun 25, 2012  8:21 AM ------      Message from: Horald Pollen      Created: Fri Jun 22, 2012  8:18 AM       Labs were within normal limits ------

## 2012-07-13 ENCOUNTER — Encounter: Payer: Self-pay | Admitting: Physician Assistant

## 2012-07-26 ENCOUNTER — Other Ambulatory Visit: Payer: Self-pay | Admitting: Nurse Practitioner

## 2012-07-26 ENCOUNTER — Telehealth: Payer: Self-pay | Admitting: *Deleted

## 2012-07-26 MED ORDER — PROMETHAZINE HCL 12.5 MG PO TABS: 12.5000 mg | ORAL_TABLET | Freq: Four times a day (QID) | ORAL | Status: DC | PRN

## 2012-07-26 NOTE — Telephone Encounter (Signed)
N/v/d- started yesterday- cant keep anything down- Can we call in [facility] pharmacy - some phenergan something for nausea nkda Also having back muscle spasms- had muscle relaxer- but ran out if we can call that in too- that would be great

## 2012-07-26 NOTE — Telephone Encounter (Signed)
Called in phenergan to [facility] because [facility] pharmacy called

## 2012-07-27 ENCOUNTER — Telehealth: Payer: Self-pay | Admitting: Nurse Practitioner

## 2012-07-27 NOTE — Telephone Encounter (Signed)
Patient aware.

## 2012-07-27 NOTE — Telephone Encounter (Signed)
Spoke with patients wife and told her that [facility] called in rx for phenergan. If the vomiting continues wife was told to take to ER for evaluation.

## 2012-07-31 ENCOUNTER — Encounter: Payer: Self-pay | Admitting: Internal Medicine

## 2012-08-02 ENCOUNTER — Encounter: Payer: Self-pay | Admitting: Gastroenterology

## 2012-08-02 ENCOUNTER — Ambulatory Visit (INDEPENDENT_AMBULATORY_CARE_PROVIDER_SITE_OTHER): Payer: Medicare Other | Admitting: Gastroenterology

## 2012-08-02 VITALS — BP 126/76 | HR 73 | Temp 98.4°F | Ht 70.0 in | Wt 188.2 lb

## 2012-08-02 DIAGNOSIS — K219 Gastro-esophageal reflux disease without esophagitis: Secondary | ICD-10-CM

## 2012-08-02 MED ORDER — PANTOPRAZOLE SODIUM 40 MG PO TBEC: 40.0000 mg | DELAYED_RELEASE_TABLET | Freq: Every day | ORAL | Status: DC

## 2012-08-02 NOTE — Patient Instructions (Addendum)
Start taking Protonix each morning, 30 minutes before breakfast.   Please review the GERD diet provided, and call if you do not have any improvement.   We will see you back in 3 months!  Diet for Gastroesophageal Reflux Disease, Adult Reflux (acid reflux) is when acid from your stomach flows up into the esophagus. When acid comes in contact with the esophagus, the acid causes irritation and soreness (inflammation) in the esophagus. When reflux happens often or so severely that it causes damage to the esophagus, it is called gastroesophageal reflux disease (GERD). Nutrition therapy can help ease the discomfort of GERD. FOODS OR DRINKS TO AVOID OR LIMIT  Smoking or chewing tobacco. Nicotine is one of the most potent stimulants to acid production in the gastrointestinal tract.  Caffeinated and decaffeinated coffee and black tea.  Regular or low-calorie carbonated beverages or energy drinks (caffeine-free carbonated beverages are allowed).   Strong spices, such as black pepper, white pepper, red pepper, cayenne, curry powder, and chili powder.  Peppermint or spearmint.  Chocolate.  High-fat foods, including meats and fried foods. Extra added fats including oils, butter, salad dressings, and nuts. Limit these to less than 8 tsp per day.  Fruits and vegetables if they are not tolerated, such as citrus fruits or tomatoes.  Alcohol.  Any food that seems to aggravate your condition. If you have questions regarding your diet, call your caregiver or a registered dietitian. OTHER THINGS THAT MAY HELP GERD INCLUDE:   Eating your meals slowly, in a relaxed setting.  Eating 5 to 6 small meals per day instead of 3 large meals.  Eliminating food for a period of time if it causes distress.  Not lying down until 3 hours after eating a meal.  Keeping the head of your bed raised 6 to 9 inches (15 to 23 cm) by using a foam wedge or blocks under the legs of the bed. Lying flat may make symptoms  worse.  Being physically active. Weight loss may be helpful in reducing reflux in overweight or obese adults.  Wear loose fitting clothing EXAMPLE MEAL PLAN This meal plan is approximately 2,000 calories based on https://www.bernard.org/ meal planning guidelines. Breakfast   cup cooked oatmeal.  1 cup strawberries.  1 cup low-fat milk.  1 oz almonds. Snack  1 cup cucumber slices.  6 oz yogurt (made from low-fat or fat-free milk). Lunch  2 slice whole-wheat bread.  2 oz sliced [place].  2 tsp mayonnaise.  1 cup blueberries.  1 cup snap peas. Snack  6 whole-wheat crackers.  1 oz string cheese. Dinner   cup Balzarini rice.  1 cup mixed veggies.  1 tsp olive oil.  3 oz grilled fish. Document Released: 03/21/2005 Document Revised: 06/13/2011 Document Reviewed: 02/04/2011 [facility] Patient Information 2013 [place], [place].

## 2012-08-02 NOTE — Progress Notes (Signed)
Primary Care Physician:  Rudi Heap, MD Primary Gastroenterologist:  Dr. Darrick Penna   Chief Complaint  Patient presents with  . Gastrophageal Reflux  . Emesis    HPI:  Patrick Bell is a very pleasant 72 year old male, appearing much younger than his stated age, presenting as a self-referral due to GERD.  For 3 years, has had reflux. Takes Prilosec. Symptomatic without Prilosec. Would take for 14 days then stop. 2 weeks ago, +N/V. Throwing up bile for several days. Wants to know what he can take to control GERD.   Drinks 6 cups of coffee per day. Lots of [employer]. No dysphagia, no loss of appetite, but he has lost 10 lbs in last 6 months. Unintentional. "just happening" because changing eating habits. Was about 196 a few weeks ago, now 189.   Hx of chronic diarrhea, which is now improved. Taking probiotics, eating Activia. Eats a lot of ice cream. Last colonoscopy in 2005, due for routine screening in 2015.   Past Medical History  Diagnosis Date  . Asthma   . Hypertension   . Hyperlipidemia   . Diverticulitis     Past Surgical History  Procedure Laterality Date  . Hernia repair  09/14/2004  . Throat surgery    . Colonoscopy  06/09/2004    [id number] colon diverticulosis, otherwise normal colonoscopy    Current Outpatient Prescriptions  Medication Sig Dispense Refill  . albuterol (PROVENTIL HFA;VENTOLIN HFA) 108 (90 BASE) MCG/ACT inhaler Inhale 2 puffs into the lungs every 6 (six) hours as needed for wheezing.      Marland Kitchen amLODipine (NORVASC) 5 MG tablet Take 5 mg by mouth daily.      Marland Kitchen doxycycline (MONODOX) 100 MG capsule Take 100 mg by mouth 2 (two) times daily.       . fexofenadine (ALLEGRA) 30 MG tablet Take 30 mg by mouth 2 (two) times daily.      . montelukast (SINGULAIR) 10 MG tablet Take 10 mg by mouth at bedtime.      Marland Kitchen omeprazole (PRILOSEC) 20 MG capsule Take 20 mg by mouth daily.      . promethazine (PHENERGAN) 12.5 MG tablet Take 1 tablet (12.5 mg total) by mouth  every 6 (six) hours as needed for nausea.  30 tablet  0  . simvastatin (ZOCOR) 40 MG tablet Take 40 mg by mouth every evening.      Marland Kitchen amoxicillin (AMOXIL) 875 MG tablet Take 1 tablet (875 mg total) by mouth 2 (two) times daily.  20 tablet  0   No current facility-administered medications for this visit.    Allergies as of 08/02/2012  . (No Known Allergies)    Family History  Problem Relation Age of Onset  . Heart disease Mother   . Arthritis Father     History   Social History  . Marital Status: Married    Spouse Name: N/A    Number of Children: N/A  . Years of Education: N/A   Occupational History  . Not on file.   Social History Main Topics  . Smoking status: Former Smoker    Types: Cigarettes    Quit date: 06/23/1982  . Smokeless tobacco: Not on file  . Alcohol Use: 1.8 oz/week    3 Cans of beer per week  . Drug Use: No  . Sexually Active: Not on file   Other Topics Concern  . Not on file   Social History Narrative  . No narrative on file    Review of  Systems: Negative unless mentioned in HPI  Physical Exam: BP 126/76  Pulse 73  Temp(Src) 98.4 F (36.9 C) (Oral)  Ht 5\' 10"  (1.778 m)  Wt 188 lb 3.2 oz (85.367 kg)  BMI 27 kg/m2 General:   Alert and oriented. Pleasant and cooperative. Well-nourished and well-developed.  Head:  Normocephalic and atraumatic. Eyes:  Without icterus, sclera clear and conjunctiva pink.  Ears:  Normal auditory acuity. Nose:  No deformity, discharge,  or lesions. Mouth:  No deformity or lesions, oral mucosa pink.  Neck:  Supple, without mass or thyromegaly. Lungs:  Clear to auscultation bilaterally. No wheezes, rales, or rhonchi. No distress.  Heart:  S1, S2 present without murmurs appreciated.  Abdomen:  +BS, soft, non-tender and non-distended. No HSM noted. No guarding or rebound. No masses appreciated.  Rectal:  Deferred  Msk:  Symmetrical without gross deformities. Normal posture. Extremities:  Without clubbing or  edema. Neurologic:  Alert and  oriented x4;  grossly normal neurologically. Skin:  Intact without significant lesions or rashes. Cervical Nodes:  No significant cervical adenopathy. Psych:  Alert and cooperative. Normal mood and affect.

## 2012-08-03 DIAGNOSIS — K219 Gastro-esophageal reflux disease without esophagitis: Secondary | ICD-10-CM | POA: Insufficient documentation

## 2012-08-03 NOTE — Assessment & Plan Note (Signed)
72 year old male with several year history of GERD, presenting as a self-referral. He does well with Prilosec daily, but he notes recurrent GERD without a PPI. No dysphagia, no abdominal pain. He does note weight loss, but he believes he has been changing his eating habits. He drinks multiple cups of coffee per day and enjoys hershey kisses. Likely this is the culprit for continued GERD symptoms. Will trial Protonix for now, follow GERD diet, and have him return in 3 months. May need EGD if no improvement. No concerning signs currently. Routine screening colonoscopy in 2015.

## 2012-08-06 NOTE — Progress Notes (Signed)
Cc PCP 

## 2012-08-10 ENCOUNTER — Telehealth: Payer: Self-pay

## 2012-08-10 MED ORDER — PANTOPRAZOLE SODIUM 40 MG PO TBEC: 40.0000 mg | DELAYED_RELEASE_TABLET | Freq: Every day | ORAL | Status: DC

## 2012-08-10 NOTE — Telephone Encounter (Signed)
Pt left VM that his prescription was not sent to [facility] on the day of his office visit 08/02/2012. ( Looks like the Protonix was sent to [facility] in [place]. ) Will route to Tana [place], PA to send to the [facility] in Anna's absence).

## 2012-08-10 NOTE — Telephone Encounter (Signed)
done

## 2012-08-22 ENCOUNTER — Other Ambulatory Visit: Payer: Self-pay | Admitting: Physician Assistant

## 2012-08-22 ENCOUNTER — Ambulatory Visit: Payer: Medicare Other | Attending: Family Medicine | Admitting: Physical Therapy

## 2012-08-22 DIAGNOSIS — M545 Low back pain, unspecified: Secondary | ICD-10-CM | POA: Insufficient documentation

## 2012-08-22 DIAGNOSIS — IMO0001 Reserved for inherently not codable concepts without codable children: Secondary | ICD-10-CM | POA: Insufficient documentation

## 2012-08-22 DIAGNOSIS — R5381 Other malaise: Secondary | ICD-10-CM | POA: Insufficient documentation

## 2012-08-23 NOTE — Telephone Encounter (Signed)
LAST OV 3/14. DIDN'T PATADAY ON MED LIST

## 2012-08-28 ENCOUNTER — Ambulatory Visit: Payer: Medicare Other | Admitting: *Deleted

## 2012-08-30 ENCOUNTER — Ambulatory Visit: Payer: Medicare Other | Admitting: Physical Therapy

## 2012-09-03 ENCOUNTER — Ambulatory Visit: Payer: Medicare Other | Attending: Family Medicine | Admitting: Physical Therapy

## 2012-09-03 DIAGNOSIS — IMO0001 Reserved for inherently not codable concepts without codable children: Secondary | ICD-10-CM | POA: Insufficient documentation

## 2012-09-03 DIAGNOSIS — M545 Low back pain, unspecified: Secondary | ICD-10-CM | POA: Insufficient documentation

## 2012-09-03 DIAGNOSIS — R5381 Other malaise: Secondary | ICD-10-CM | POA: Insufficient documentation

## 2012-09-06 ENCOUNTER — Ambulatory Visit: Payer: Medicare Other | Admitting: Physical Therapy

## 2012-09-13 ENCOUNTER — Ambulatory Visit: Payer: Medicare Other | Admitting: Physical Therapy

## 2013-01-03 ENCOUNTER — Other Ambulatory Visit: Payer: Self-pay | Admitting: Nurse Practitioner

## 2013-01-14 ENCOUNTER — Ambulatory Visit (INDEPENDENT_AMBULATORY_CARE_PROVIDER_SITE_OTHER): Payer: Medicare Other | Admitting: Family Medicine

## 2013-01-14 ENCOUNTER — Encounter: Payer: Self-pay | Admitting: Family Medicine

## 2013-01-14 VITALS — BP 132/78 | HR 70 | Temp 97.1°F | Ht 70.0 in | Wt 188.2 lb

## 2013-01-14 DIAGNOSIS — J45909 Unspecified asthma, uncomplicated: Secondary | ICD-10-CM

## 2013-01-14 DIAGNOSIS — K219 Gastro-esophageal reflux disease without esophagitis: Secondary | ICD-10-CM

## 2013-01-14 DIAGNOSIS — E785 Hyperlipidemia, unspecified: Secondary | ICD-10-CM

## 2013-01-14 DIAGNOSIS — J309 Allergic rhinitis, unspecified: Secondary | ICD-10-CM

## 2013-01-14 DIAGNOSIS — I10 Essential (primary) hypertension: Secondary | ICD-10-CM

## 2013-01-14 DIAGNOSIS — Z23 Encounter for immunization: Secondary | ICD-10-CM

## 2013-01-14 DIAGNOSIS — J302 Other seasonal allergic rhinitis: Secondary | ICD-10-CM

## 2013-01-14 MED ORDER — FEXOFENADINE-PSEUDOEPHED ER 60-120 MG PO TB12: ORAL_TABLET | ORAL | Status: DC

## 2013-01-14 MED ORDER — MONTELUKAST SODIUM 10 MG PO TABS: ORAL_TABLET | ORAL | Status: DC

## 2013-01-14 MED ORDER — PANTOPRAZOLE SODIUM 40 MG PO TBEC: 40.0000 mg | DELAYED_RELEASE_TABLET | Freq: Every day | ORAL | Status: DC

## 2013-01-14 MED ORDER — SIMVASTATIN 40 MG PO TABS: ORAL_TABLET | ORAL | Status: DC

## 2013-01-14 MED ORDER — AMLODIPINE BESYLATE 5 MG PO TABS: ORAL_TABLET | ORAL | Status: DC

## 2013-01-14 MED ORDER — ALBUTEROL SULFATE HFA 108 (90 BASE) MCG/ACT IN AERS: 2.0000 | INHALATION_SPRAY | Freq: Four times a day (QID) | RESPIRATORY_TRACT | Status: DC | PRN

## 2013-01-14 NOTE — Patient Instructions (Addendum)
Dr Woodroe Mode Recommendations  For nutrition information, I recommend books:  1).Eat to Live by Dr Monico Hoar. 2).Prevent and Reverse Heart Disease by Dr Suzzette Righter. 3) Dr Katherina Right Book:  Program to Reverse Diabetes 4) The [place] Study by Florene Route  Exercise recommendations are:  If unable to walk, then the patient can exercise in a chair 3 times a day. By flapping arms like a bird gently and raising legs outwards to the front.  If ambulatory, the patient can go for walks for 30 minutes 3 times a week. Then increase the intensity and duration as tolerated.  Goal is to try to attain exercise frequency to 5 times a week.  If applicable: Best to perform resistance exercises (machines or weights) 2 days a week and cardio type exercises 3 days per week.   Gastroesophageal Reflux Disease, Adult Gastroesophageal reflux disease (GERD) happens when acid from your stomach flows up into the esophagus. When acid comes in contact with the esophagus, the acid causes soreness (inflammation) in the esophagus. Over time, GERD may create small holes (ulcers) in the lining of the esophagus. CAUSES   Increased body weight. This puts pressure on the stomach, making acid rise from the stomach into the esophagus.  Smoking. This increases acid production in the stomach.  Drinking alcohol. This causes decreased pressure in the lower esophageal sphincter (valve or ring of muscle between the esophagus and stomach), allowing acid from the stomach into the esophagus.  Late evening meals and a full stomach. This increases pressure and acid production in the stomach.  A malformed lower esophageal sphincter. Sometimes, no cause is found. SYMPTOMS   Burning pain in the lower part of the mid-chest behind the breastbone and in the mid-stomach area. This may occur twice a week or more often.  Trouble swallowing.  Sore throat.  Dry cough.  Asthma-like symptoms including chest  tightness, shortness of breath, or wheezing. DIAGNOSIS  Your caregiver may be able to diagnose GERD based on your symptoms. In some cases, X-rays and other tests may be done to check for complications or to check the condition of your stomach and esophagus. TREATMENT  Your caregiver may recommend over-the-counter or prescription medicines to help decrease acid production. Ask your caregiver before starting or adding any new medicines.  HOME CARE INSTRUCTIONS   Change the factors that you can control. Ask your caregiver for guidance concerning weight loss, quitting smoking, and alcohol consumption.  Avoid foods and drinks that make your symptoms worse, such as:  Caffeine or alcoholic drinks.  Chocolate.  Peppermint or mint flavorings.  Garlic and onions.  Spicy foods.  Citrus fruits, such as oranges, lemons, or limes.  Tomato-based foods such as sauce, chili, salsa, and pizza.  Fried and fatty foods.  Avoid lying down for the 3 hours prior to your bedtime or prior to taking a nap.  Eat small, frequent meals instead of large meals.  Wear loose-fitting clothing. Do not wear anything tight around your waist that causes pressure on your stomach.  Raise the head of your bed 6 to 8 inches with wood blocks to help you sleep. Extra pillows will not help.  Only take over-the-counter or prescription medicines for pain, discomfort, or fever as directed by your caregiver.  Do not take aspirin, ibuprofen, or other nonsteroidal anti-inflammatory drugs (NSAIDs). SEEK IMMEDIATE MEDICAL CARE IF:   You have pain in your arms, neck, jaw, teeth, or back.  Your pain increases or changes  in intensity or duration.  You develop nausea, vomiting, or sweating (diaphoresis).  You develop shortness of breath, or you faint.  Your vomit is green, yellow, black, or looks like coffee grounds or blood.  Your stool is red, bloody, or black. These symptoms could be signs of other problems, such as  heart disease, gastric bleeding, or esophageal bleeding. MAKE SURE YOU:   Understand these instructions.  Will watch your condition.  Will get help right away if you are not doing well or get worse. Document Released: 12/29/2004 Document Revised: 06/13/2011 Document Reviewed: 10/08/2010 [facility] Patient Information 2014 [place], [place]. Pneumococcal Vaccine, Polyvalent suspension for injection What is this medicine? PNEUMOCOCCAL VACCINE, POLYVALENT (NEU mo KOK al vak SEEN, pol ee VEY luhnt) is a vaccine to prevent pneumococcus bacteria infection. These bacteria are a major cause of ear infections, 'Strep throat' infections, and serious pneumonia, meningitis, or blood infections worldwide. These vaccines help the body to produce antibodies (protective substances) that help your body defend against these bacteria. This vaccine is recommended for infants and young children. This vaccine will not treat an infection. This medicine may be used for other purposes; ask your health care provider or pharmacist if you have questions. What should I tell my health care provider before I take this medicine? They need to know if you have any of these conditions: -bleeding problems -fever -immune system problems -low platelet count in the blood -seizures -an unusual or allergic reaction to pneumococcal vaccine, diphtheria toxoid, other vaccines, latex, other medicines, foods, dyes, or preservatives -pregnant or trying to get pregnant -breast-feeding How should I use this medicine? This vaccine is for injection into a muscle. It is given by a health care professional. A copy of Vaccine Information Statements will be given before each vaccination. Read this sheet carefully each time. The sheet may change frequently. Talk to your pediatrician regarding the use of this medicine in children. While this drug may be prescribed for children as young as 49 weeks old for selected conditions, precautions do  apply. Overdosage: If you think you have taken too much of this medicine contact a poison control center or emergency room at once. NOTE: This medicine is only for you. Do not share this medicine with others. What if I miss a dose? It is important not to miss your dose. Call your doctor or health care professional if you are unable to keep an appointment. What may interact with this medicine? -medicines for cancer chemotherapy -medicines that suppress your immune function -medicines that treat or prevent blood clots like warfarin, enoxaparin, and dalteparin -steroid medicines like prednisone or cortisone This list may not describe all possible interactions. Give your health care provider a list of all the medicines, herbs, non-prescription drugs, or dietary supplements you use. Also tell them if you smoke, drink alcohol, or use illegal drugs. Some items may interact with your medicine. What should I watch for while using this medicine? Mild fever and pain should go away in 3 days or less. Report any unusual symptoms to your doctor or health care professional. What side effects may I notice from receiving this medicine? Side effects that you should report to your doctor or health care professional as soon as possible: -allergic reactions like skin rash, itching or hives, swelling of the face, lips, or tongue -breathing problems -confused -fever over 102 degrees F -pain, tingling, numbness in the hands or feet -seizures -unusual bleeding or bruising -unusual muscle weakness Side effects that usually do not require medical  attention (report to your doctor or health care professional if they continue or are bothersome): -aches and pains -diarrhea -fever of 102 degrees F or less -headache -irritable -loss of appetite -pain, tender at site where injected -trouble sleeping This list may not describe all possible side effects. Call your doctor for medical advice about side effects. You may  report side effects to FDA at 1-800-FDA-1088. Where should I keep my medicine? This does not apply. This vaccine is given in a clinic, pharmacy, doctor's office, or other health care setting and will not be stored at home. NOTE: This sheet is a summary. It may not cover all possible information. If you have questions about this medicine, talk to your doctor, pharmacist, or health care provider.  2012, Elsevier/Gold Standard. (06/03/2008 10:17:22 AM)Influenza Virus Vaccine injection (Fluarix) What is this medicine? INFLUENZA VIRUS VACCINE (in floo EN zuh VAHY ruhs vak SEEN) helps to reduce the risk of getting influenza also known as the flu. This medicine may be used for other purposes; ask your health care provider or pharmacist if you have questions. What should I tell my health care provider before I take this medicine? They need to know if you have any of these conditions: -bleeding disorder like hemophilia -fever or infection -Guillain-Barre syndrome or other neurological problems -immune system problems -infection with the human immunodeficiency virus (HIV) or AIDS -low blood platelet counts -multiple sclerosis -an unusual or allergic reaction to influenza virus vaccine, eggs, chicken proteins, latex, gentamicin, other medicines, foods, dyes or preservatives -pregnant or trying to get pregnant -breast-feeding How should I use this medicine? This vaccine is for injection into a muscle. It is given by a health care professional. A copy of Vaccine Information Statements will be given before each vaccination. Read this sheet carefully each time. The sheet may change frequently. Talk to your pediatrician regarding the use of this medicine in children. Special care may be needed. Overdosage: If you think you have taken too much of this medicine contact a poison control center or emergency room at once. NOTE: This medicine is only for you. Do not share this medicine with others. What if I miss  a dose? This does not apply. What may interact with this medicine? -chemotherapy or radiation therapy -medicines that lower your immune system like etanercept, anakinra, infliximab, and adalimumab -medicines that treat or prevent blood clots like warfarin -phenytoin -steroid medicines like prednisone or cortisone -theophylline -vaccines This list may not describe all possible interactions. Give your health care provider a list of all the medicines, herbs, non-prescription drugs, or dietary supplements you use. Also tell them if you smoke, drink alcohol, or use illegal drugs. Some items may interact with your medicine. What should I watch for while using this medicine? Report any side effects that do not go away within 3 days to your doctor or health care professional. Call your health care provider if any unusual symptoms occur within 6 weeks of receiving this vaccine. You may still catch the flu, but the illness is not usually as bad. You cannot get the flu from the vaccine. The vaccine will not protect against colds or other illnesses that may cause fever. The vaccine is needed every year. What side effects may I notice from receiving this medicine? Side effects that you should report to your doctor or health care professional as soon as possible: -allergic reactions like skin rash, itching or hives, swelling of the face, lips, or tongue Side effects that usually do not require medical  attention (report to your doctor or health care professional if they continue or are bothersome): -fever -headache -muscle aches and pains -pain, tenderness, redness, or swelling at site where injected -weak or tired This list may not describe all possible side effects. Call your doctor for medical advice about side effects. You may report side effects to FDA at 1-800-FDA-1088. Where should I keep my medicine? This vaccine is only given in a clinic, pharmacy, doctor's office, or other health care setting and  will not be stored at home. NOTE: This sheet is a summary. It may not cover all possible information. If you have questions about this medicine, talk to your doctor, pharmacist, or health care provider.  2013, Elsevier/Gold Standard. (10/17/2007 9:30:40 AM)

## 2013-01-14 NOTE — Progress Notes (Signed)
Patient ID: Patrick Bell, male   DOB: 1940-07-27, 72 y.o.   MRN: [id number] SUBJECTIVE: CC: Chief Complaint  Patient presents with  . Follow-up    [employer] needs colecectal screening and BMI and refillx    HPI:  Patient is here for follow up of hyperlipidemia/sleep apnea/asthma: denies Headache;denies Chest Pain;denies weakness;denies Shortness of Breath and orthopnea;denies Visual changes;denies palpitations;denies cough;denies pedal edema;denies symptoms of TIA or stroke;deniesClaudication symptoms. admits to Compliance with medications; denies Problems with medications.  Walks daily 1 mile for exercise.  Breakfast: 2 cups of coffee. Lunch: Vegetables and a meat or [place] sandwich Supper: chicken and veggies, cheese  Past Medical History  Diagnosis Date  . Asthma   . Hypertension   . Hyperlipidemia   . Diverticulitis   . GERD (gastroesophageal reflux disease)    Past Surgical History  Procedure Laterality Date  . Hernia repair    . Palate / uvula biopsy / excision    . Colonoscopy  06/09/2004    WUJ:[id number] colon diverticulosis, otherwise normal colonoscopy  . Bilateral inguinal hernia     History   Social History  . Marital Status: Married    Spouse Name: N/A    Number of Children: N/A  . Years of Education: N/A   Occupational History  . retired     truck driving   Social History Main Topics  . Smoking status: Former Smoker    Types: Cigarettes    Quit date: 06/23/1982  . Smokeless tobacco: Not on file  . Alcohol Use: 0.0 oz/week     Comment: brandy a few times a month, beer once a month  . Drug Use: No  . Sexual Activity: Not on file   Other Topics Concern  . Not on file   Social History Narrative  . No narrative on file   Family History  Problem Relation Age of Onset  . Heart disease Mother   . Arthritis Father   . Colon cancer Neg Hx    Current Outpatient Prescriptions on File Prior to Visit  Medication Sig Dispense Refill  . albuterol  (PROVENTIL HFA;VENTOLIN HFA) 108 (90 BASE) MCG/ACT inhaler Inhale 2 puffs into the lungs every 6 (six) hours as needed for wheezing.      Marland Kitchen ALLEGRA-D ALLERGY & CONGESTION 60-120 MG per tablet TAKE  (1)  TABLET TWICE A DAY.  60 tablet  0  . amLODipine (NORVASC) 5 MG tablet TAKE 1 TABLET DAILY  30 tablet  0  . montelukast (SINGULAIR) 10 MG tablet TAKE 1 TABLET DAILY  30 tablet  0  . pantoprazole (PROTONIX) 40 MG tablet Take 1 tablet (40 mg total) by mouth daily.  30 tablet  3  . PATADAY 0.2 % SOLN INSTILL 1 DROP IN EACH EYE TWICE DAILY  2.5 mL  2  . simvastatin (ZOCOR) 40 MG tablet TAKE ONE TABLET AT BEDTIME  30 tablet  0   No current facility-administered medications on file prior to visit.   No Known Allergies  There is no immunization history on file for this patient. Prior to Admission medications   Medication Sig Start Date End Date Taking? Authorizing Provider  albuterol (PROVENTIL HFA;VENTOLIN HFA) 108 (90 BASE) MCG/ACT inhaler Inhale 2 puffs into the lungs every 6 (six) hours as needed for wheezing.   Yes Historical Provider, MD  ALLEGRA-D ALLERGY & CONGESTION 60-120 MG per tablet TAKE  (1)  TABLET TWICE A DAY. 01/03/13  Yes Mary-Margaret Daphine Deutscher, FNP  amLODipine (NORVASC) 5 MG tablet TAKE  1 TABLET DAILY 01/03/13  Yes Mary-Margaret Daphine Deutscher, FNP  montelukast (SINGULAIR) 10 MG tablet TAKE 1 TABLET DAILY 01/03/13  Yes Mary-Margaret Daphine Deutscher, FNP  pantoprazole (PROTONIX) 40 MG tablet Take 1 tablet (40 mg total) by mouth daily. 08/10/12  Yes Tiffany Kocher, PA-C  PATADAY 0.2 % SOLN INSTILL 1 DROP IN [facility] EYE TWICE DAILY 08/22/12  Yes Mary-Margaret Daphine Deutscher, FNP  simvastatin (ZOCOR) 40 MG tablet TAKE ONE TABLET AT BEDTIME 01/03/13  Yes Mary-Margaret Daphine Deutscher, FNP     ROS: As above in the HPI. All other systems are stable or negative.  OBJECTIVE: APPEARANCE:  Patient in no acute distress.The patient appeared well nourished and normally developed. Acyanotic. Waist: VITAL SIGNS:BP 132/78  Pulse 70   Temp(Src) 97.1 F (36.2 C) (Oral)  Ht 5\' 10"  (1.778 m)  Wt 188 lb 3.2 oz (85.367 kg)  BMI 27 kg/m2 WM  SKIN: warm and  Dry without overt rashes, tattoos and scars  HEAD and Neck: without JVD, Head and scalp: normal Eyes:No scleral icterus. Fundi normal, eye movements normal. Ears: Auricle normal, canal normal, Tympanic membranes normal, insufflation normal. Nose: normal Throat: normal Neck & thyroid: normal  CHEST & LUNGS: Chest wall: normal Lungs: Clear  CVS: Reveals the PMI to be normally located. Regular rhythm, First and Second Heart sounds are normal,  absence of murmurs, rubs or gallops. Peripheral vasculature: Radial pulses: normal Dorsal pedis pulses: normal Posterior pulses: normal  ABDOMEN:  Appearance: normal Benign, no organomegaly, no masses, no Abdominal Aortic enlargement. No Guarding , no rebound. No Bruits. Bowel sounds: normal  RECTAL: N/A GU: N/A  EXTREMETIES: nonedematous.  MUSCULOSKELETAL:  Spine: normal Joints: intact  NEUROLOGIC: oriented to time,place and person; nonfocal. Strength is normal Sensory is normal Reflexes are normal Cranial Nerves are normal.  ASSESSMENT: Essential hypertension, benign - Plan: CMP14+EGFR, amLODipine (NORVASC) 5 MG tablet  GERD (gastroesophageal reflux disease) - Plan: pantoprazole (PROTONIX) 40 MG tablet  HLD (hyperlipidemia) - Plan: NMR, lipoprofile, simvastatin (ZOCOR) 40 MG tablet  Unspecified asthma(493.90) - Plan: albuterol (PROVENTIL HFA;VENTOLIN HFA) 108 (90 BASE) MCG/ACT inhaler, montelukast (SINGULAIR) 10 MG tablet, Pneumococcal polysaccharide vaccine 23-valent greater than or equal to 2yo subcutaneous/IM, Flu Vaccine QUAD 36+ mos PF IM (Fluarix)  Seasonal allergic rhinitis - Plan: fexofenadine-pseudoephedrine (ALLEGRA-D ALLERGY & CONGESTION) 60-120 MG per tablet  Need for prophylactic vaccination and inoculation against influenza - Plan: Flu Vaccine QUAD 36+ mos PF IM  (Fluarix)   PLAN:      Dr Woodroe Mode Recommendations  For nutrition information, I recommend books:  1).Eat to Live by Dr Monico Hoar. 2).Prevent and Reverse Heart Disease by Dr Suzzette Righter. 3) Dr Katherina Right Book:  Program to Reverse Diabetes 4) The [place] Study by T Ferol Luz.  Exercise recommendations are:  If unable to walk, then the patient can exercise in a chair 3 times a day. By flapping arms like a bird gently and raising legs outwards to the front.  If ambulatory, the patient can go for walks for 30 minutes 3 times a week. Then increase the intensity and duration as tolerated.  Goal is to try to attain exercise frequency to 5 times a week.  If applicable: Best to perform resistance exercises (machines or weights) 2 days a week and cardio type exercises 3 days per week.  Orders Placed This Encounter  Procedures  . Pneumococcal polysaccharide vaccine 23-valent greater than or equal to 2yo subcutaneous/IM  . Flu Vaccine QUAD 36+ mos PF IM (Fluarix)  . CMP14+EGFR  .  NMR, lipoprofile    Meds ordered this encounter  Medications  . albuterol (PROVENTIL HFA;VENTOLIN HFA) 108 (90 BASE) MCG/ACT inhaler    Sig: Inhale 2 puffs into the lungs every 6 (six) hours as needed for wheezing.    Dispense:  1 Inhaler    Refill:  2  . fexofenadine-pseudoephedrine (ALLEGRA-D ALLERGY & CONGESTION) 60-120 MG per tablet    Sig: TAKE  (1)  TABLET TWICE A DAY.    Dispense:  60 tablet    Refill:  3    Must be seen before next refill  . amLODipine (NORVASC) 5 MG tablet    Sig: TAKE 1 TABLET DAILY    Dispense:  30 tablet    Refill:  11    Must be seen before next refill  . montelukast (SINGULAIR) 10 MG tablet    Sig: TAKE 1 TABLET DAILY    Dispense:  30 tablet    Refill:  5    Must be seen before next refill  . pantoprazole (PROTONIX) 40 MG tablet    Sig: Take 1 tablet (40 mg total) by mouth daily.    Dispense:  30 tablet    Refill:  3    Order Specific Question:   Supervising Provider    Answer:  Corbin Ade [[id number]]  . simvastatin (ZOCOR) 40 MG tablet    Sig: TAKE ONE TABLET AT BEDTIME    Dispense:  30 tablet    Refill:  11    Must be seen before next refill   GERD instructions and precautions in the AVS.  Return in about 3 months (around 04/16/2013) for Recheck medical problems.  Kwasi Joung P. Modesto Charon, M.D.

## 2013-01-14 NOTE — Progress Notes (Signed)
FLUARIX AND [id number]  GIVEN AND PT TOLERATED WELL

## 2013-01-16 LAB — CMP14+EGFR
ALT: 9 IU/L (ref 0–44)
AST: 15 IU/L (ref 0–40)
Albumin/Globulin Ratio: 2.5 (ref 1.1–2.5)
Albumin: 4.3 g/dL (ref 3.5–4.8)
Alkaline Phosphatase: 62 IU/L (ref 39–117)
BUN/Creatinine Ratio: 15 (ref 10–22)
BUN: 11 mg/dL (ref 8–27)
CO2: 24 mmol/L (ref 18–29)
Calcium: 8.9 mg/dL (ref 8.6–10.2)
Chloride: 102 mmol/L (ref 97–108)
Creatinine, Ser: 0.74 mg/dL — ABNORMAL LOW (ref 0.76–1.27)
GFR calc Af Amer: 106 mL/min/{1.73_m2} (ref >59)
GFR calc non Af Amer: 92 mL/min/{1.73_m2} (ref >59)
Globulin, Total: 1.7 g/dL (ref 1.5–4.5)
Glucose: 88 mg/dL (ref 65–99)
Potassium: 4 mmol/L (ref 3.5–5.2)
Sodium: 140 mmol/L (ref 134–144)
Total Bilirubin: 0.5 mg/dL (ref 0.0–1.2)
Total Protein: 6 g/dL (ref 6.0–8.5)

## 2013-01-16 LAB — NMR, LIPOPROFILE
Cholesterol: 184 mg/dL (ref <200)
HDL Cholesterol by NMR: 65 mg/dL (ref >=40)
HDL Particle Number: 43.1 umol/L (ref >=30.5)
LDL Particle Number: 1248 nmol/L — ABNORMAL HIGH (ref <1000)
LDL Size: 20.8 nm (ref >20.5)
LDLC SERPL CALC-MCNC: 99 mg/dL (ref <100)
LP-IR Score: 30 (ref <=45)
Small LDL Particle Number: 436 nmol/L (ref <=527)
Triglycerides by NMR: 101 mg/dL (ref <150)

## 2013-01-26 NOTE — Progress Notes (Signed)
REVIEWED.  CONTACT PT TO SCH OPV W/ AS OR SLF IN NOV OR DEC. REASSESS GERD/?NEED FOR EGD FOR DYSPEPSIA.

## 2013-01-29 NOTE — Progress Notes (Signed)
Reminder in Epic 

## 2013-04-18 ENCOUNTER — Ambulatory Visit: Payer: Medicare Other | Admitting: Family Medicine

## 2013-05-09 ENCOUNTER — Ambulatory Visit (INDEPENDENT_AMBULATORY_CARE_PROVIDER_SITE_OTHER): Payer: Medicare Other | Admitting: Family Medicine

## 2013-05-09 ENCOUNTER — Encounter: Payer: Self-pay | Admitting: Family Medicine

## 2013-05-09 VITALS — BP 128/69 | HR 73 | Temp 98.7°F | Ht 70.0 in | Wt 190.2 lb

## 2013-05-09 DIAGNOSIS — G4730 Sleep apnea, unspecified: Secondary | ICD-10-CM

## 2013-05-09 DIAGNOSIS — I10 Essential (primary) hypertension: Secondary | ICD-10-CM

## 2013-05-09 DIAGNOSIS — E785 Hyperlipidemia, unspecified: Secondary | ICD-10-CM | POA: Insufficient documentation

## 2013-05-09 MED ORDER — SIMVASTATIN 20 MG PO TABS: ORAL_TABLET | ORAL | Status: DC

## 2013-05-09 NOTE — Patient Instructions (Signed)
      Dr Laneka Mcgrory's Recommendations  For nutrition information, I recommend books:  1).Eat to Live by Dr Joel Fuhrman. 2).Prevent and Reverse Heart Disease by Dr Caldwell Esselstyn. 3) Dr Neal Barnard's Book:  Program to Reverse Diabetes  Exercise recommendations are:  If unable to walk, then the patient can exercise in a chair 3 times a day. By flapping arms like a bird gently and raising legs outwards to the front.  If ambulatory, the patient can go for walks for 30 minutes 3 times a week. Then increase the intensity and duration as tolerated.  Goal is to try to attain exercise frequency to 5 times a week.  If applicable: Best to perform resistance exercises (machines or weights) 2 days a week and cardio type exercises 3 days per week.  

## 2013-05-09 NOTE — Progress Notes (Signed)
Patient ID: Patrick Bell, male   DOB: [birth year], 73 y.o.   MRN: [id number] SUBJECTIVE: CC: Chief Complaint  Patient presents with  . Follow-up    reck  received paper from [employer] regarding meds     HPI: [facility] sent notification of drug interaction with amlodipine and simvastatin. He is fine. Patient is here for follow up of hyperlipidemia/HTN/: denies Headache;denies Chest Pain;denies weakness;denies Shortness of Breath and orthopnea;denies Visual changes;denies palpitations;denies cough;denies pedal edema;denies symptoms of TIA or stroke;deniesClaudication symptoms. admits to Compliance with medications; denies Problems with medications.    Past Medical History  Diagnosis Date  . Asthma   . Hypertension   . Diverticulitis   . GERD (gastroesophageal reflux disease)   . Hyperlipidemia    Past Surgical History  Procedure Laterality Date  . Hernia repair    . Palate / uvula biopsy / excision    . Colonoscopy  06/09/2004    [id number] colon diverticulosis, otherwise normal colonoscopy  . Bilateral inguinal hernia     History   Social History  . Marital Status: Married    Spouse Name: N/A    Number of Children: N/A  . Years of Education: N/A   Occupational History  . retired     truck driving   Social History Main Topics  . Smoking status: Former Smoker    Types: Cigarettes    Quit date: 06/23/1982  . Smokeless tobacco: Not on file  . Alcohol Use: 0.0 oz/week     Comment: brandy a few times a month, beer once a month  . Drug Use: No  . Sexual Activity: Not on file   Other Topics Concern  . Not on file   Social History Narrative  . No narrative on file   Family History  Problem Relation Age of Onset  . Heart disease Mother   . Arthritis Father   . Colon cancer Neg Hx    Current Outpatient Prescriptions on File Prior to Visit  Medication Sig Dispense Refill  . amLODipine (NORVASC) 5 MG tablet TAKE 1 TABLET DAILY  30 tablet  11  . montelukast  (SINGULAIR) 10 MG tablet TAKE 1 TABLET DAILY  30 tablet  5  . pantoprazole (PROTONIX) 40 MG tablet Take 1 tablet (40 mg total) by mouth daily.  30 tablet  3  . PATADAY 0.2 % SOLN INSTILL 1 DROP IN EACH EYE TWICE DAILY  2.5 mL  2  . albuterol (PROVENTIL HFA;VENTOLIN HFA) 108 (90 BASE) MCG/ACT inhaler Inhale 2 puffs into the lungs every 6 (six) hours as needed for wheezing.  1 Inhaler  2  . fexofenadine-pseudoephedrine (ALLEGRA-D ALLERGY & CONGESTION) 60-120 MG per tablet TAKE  (1)  TABLET TWICE A DAY.  60 tablet  3   No current facility-administered medications on file prior to visit.   No Known Allergies Immunization History  Administered Date(s) Administered  . Influenza,inj,Quad PF,36+ Mos 01/14/2013  . Pneumococcal Polysaccharide-23 01/14/2013   Prior to Admission medications   Medication Sig Start Date End Date Taking? Authorizing Provider  amLODipine (NORVASC) 5 MG tablet TAKE 1 TABLET DAILY 01/14/13  Yes Vernie Shanks, MD  montelukast (SINGULAIR) 10 MG tablet TAKE 1 TABLET DAILY 01/14/13  Yes Vernie Shanks, MD  pantoprazole (PROTONIX) 40 MG tablet Take 1 tablet (40 mg total) by mouth daily. 01/14/13  Yes Vernie Shanks, MD  PATADAY 0.2 % SOLN INSTILL 1 DROP IN [facility] EYE TWICE DAILY 08/22/12  Yes [name], FNP  simvastatin (ZOCOR)  40 MG tablet TAKE ONE TABLET AT BEDTIME 01/14/13  Yes Vernie Shanks, MD  albuterol (PROVENTIL HFA;VENTOLIN HFA) 108 (90 BASE) MCG/ACT inhaler Inhale 2 puffs into the lungs every 6 (six) hours as needed for wheezing. 01/14/13   Vernie Shanks, MD  fexofenadine-pseudoephedrine (ALLEGRA-D ALLERGY & CONGESTION) 60-120 MG per tablet TAKE  (1)  TABLET TWICE A DAY. 01/14/13   Vernie Shanks, MD     ROS: As above in the HPI. All other systems are stable or negative.  OBJECTIVE: APPEARANCE:  Patient in no acute distress.The patient appeared well nourished and normally developed. Acyanotic. Waist: VITAL SIGNS:BP 128/69  Pulse 73  Temp(Src) 98.7  F (37.1 C) (Oral)  Ht 5\' 10"  (1.778 m)  Wt 190 lb 3.2 oz (86.274 kg)  BMI 27.29 kg/m2  WM  SKIN: warm and  Dry without overt rashes, tattoos and scars  HEAD and Neck: without JVD, Head and scalp: normal Eyes:No scleral icterus. Fundi normal, eye movements normal. Ears: Auricle normal, canal normal, Tympanic membranes normal, insufflation normal. Nose: normal Throat: normal Neck & thyroid: normal  CHEST & LUNGS: Chest wall: normal Lungs: Clear  CVS: Reveals the PMI to be normally located. Regular rhythm, First and Second Heart sounds are normal,  absence of murmurs, rubs or gallops. Peripheral vasculature: Radial pulses: normal Dorsal pedis pulses: normal Posterior pulses: normal  ABDOMEN:  Appearance: normal Benign, no organomegaly, no masses, no Abdominal Aortic enlargement. No Guarding , no rebound. No Bruits. Bowel sounds: normal  RECTAL: N/A GU: N/A  EXTREMETIES: nonedematous.  MUSCULOSKELETAL:  Spine: normal Joints: intact  NEUROLOGIC: oriented to time,place and person; nonfocal. Strength is normal Sensory is normal Reflexes are normal Cranial Nerves are normal.  ASSESSMENT: HLD (hyperlipidemia) - Plan: simvastatin (ZOCOR) 20 MG tablet  Hyperlipidemia  Essential hypertension, benign  SLEEP APNEA  PLAN:      Dr Paula Libra Recommendations  For nutrition information, I recommend books:  1).Eat to Live by Dr Excell Seltzer. 2).Prevent and Reverse Heart Disease by Dr Karl Luke. 3) Dr Janene Harvey Book:  Program to Reverse Diabetes  Exercise recommendations are:  If unable to walk, then the patient can exercise in a chair 3 times a day. By flapping arms like a bird gently and raising legs outwards to the front.  If ambulatory, the patient can go for walks for 30 minutes 3 times a week. Then increase the intensity and duration as tolerated.  Goal is to try to attain exercise frequency to 5 times a week.  If applicable: Best to  perform resistance exercises (machines or weights) 2 days a week and cardio type exercises 3 days per week.   Discussed options of changing or reducing dose and embarking on a Plant based  Diet. Patient opted to try reduced dose and embark on a plant based  Diet.  No orders of the defined types were placed in this encounter.   Meds ordered this encounter  Medications  . simvastatin (ZOCOR) 20 MG tablet    Sig: TAKE ONE TABLET AT BEDTIME    Dispense:  30 tablet    Refill:  11    Must be seen before next refill   Medications Discontinued During This Encounter  Medication Reason  . simvastatin (ZOCOR) 40 MG tablet Reorder   Return in about 2 months (around 07/07/2013) for Recheck medical problems.  Shonna Deiter P. Jacelyn Grip, M.D.

## 2013-07-18 ENCOUNTER — Encounter: Payer: Self-pay | Admitting: Family Medicine

## 2013-07-18 ENCOUNTER — Ambulatory Visit (INDEPENDENT_AMBULATORY_CARE_PROVIDER_SITE_OTHER): Payer: Medicare Other | Admitting: Family Medicine

## 2013-07-18 ENCOUNTER — Encounter (INDEPENDENT_AMBULATORY_CARE_PROVIDER_SITE_OTHER): Payer: Self-pay

## 2013-07-18 VITALS — BP 122/71 | HR 62 | Temp 97.1°F | Ht 70.0 in | Wt 191.2 lb

## 2013-07-18 DIAGNOSIS — H8110 Benign paroxysmal vertigo, unspecified ear: Secondary | ICD-10-CM

## 2013-07-18 DIAGNOSIS — K219 Gastro-esophageal reflux disease without esophagitis: Secondary | ICD-10-CM

## 2013-07-18 DIAGNOSIS — I4949 Other premature depolarization: Secondary | ICD-10-CM

## 2013-07-18 DIAGNOSIS — J45909 Unspecified asthma, uncomplicated: Secondary | ICD-10-CM

## 2013-07-18 DIAGNOSIS — E559 Vitamin D deficiency, unspecified: Secondary | ICD-10-CM

## 2013-07-18 DIAGNOSIS — K5730 Diverticulosis of large intestine without perforation or abscess without bleeding: Secondary | ICD-10-CM

## 2013-07-18 DIAGNOSIS — G4730 Sleep apnea, unspecified: Secondary | ICD-10-CM

## 2013-07-18 DIAGNOSIS — E785 Hyperlipidemia, unspecified: Secondary | ICD-10-CM

## 2013-07-18 DIAGNOSIS — M549 Dorsalgia, unspecified: Secondary | ICD-10-CM

## 2013-07-18 DIAGNOSIS — I10 Essential (primary) hypertension: Secondary | ICD-10-CM

## 2013-07-18 LAB — POCT CBC
Granulocyte percent: 74.7 %G (ref 37–80)
HCT, POC: 45.9 % (ref 43.5–53.7)
Hemoglobin: 14.7 g/dL (ref 14.1–18.1)
Lymph, poc: 1.2 (ref 0.6–3.4)
MCH, POC: 27.9 pg (ref 27–31.2)
MCHC: 32.1 g/dL (ref 31.8–35.4)
MCV: 86.9 fL (ref 80–97)
MPV: 8.4 fL (ref 0–99.8)
POC Granulocyte: 4.3 (ref 2–6.9)
POC LYMPH PERCENT: 20.9 %L (ref 10–50)
Platelet Count, POC: 218 10*3/uL (ref 142–424)
RBC: 5.3 M/uL (ref 4.69–6.13)
RDW, POC: 14.2 %
WBC: 5.8 10*3/uL (ref 4.6–10.2)

## 2013-07-18 NOTE — Patient Instructions (Addendum)
Vertigo Vertigo means you feel like you or your surroundings are moving when they are not. Vertigo can be dangerous if it occurs when you are at work, driving, or performing difficult activities.  CAUSES  Vertigo occurs when there is a conflict of signals sent to your brain from the visual and sensory systems in your body. There are many different causes of vertigo, including:  Infections, especially in the inner ear.  A bad reaction to a drug or misuse of alcohol and medicines.  Withdrawal from drugs or alcohol.  Rapidly changing positions, such as lying down or rolling over in bed.  A migraine headache.  Decreased blood flow to the brain.  Increased pressure in the brain from a head injury, infection, tumor, or bleeding. SYMPTOMS  You may feel as though the world is spinning around or you are falling to the ground. Because your balance is upset, vertigo can cause nausea and vomiting. You may have involuntary eye movements (nystagmus). DIAGNOSIS  Vertigo is usually diagnosed by physical exam. If the cause of your vertigo is unknown, your caregiver may perform imaging tests, such as an MRI scan (magnetic resonance imaging). TREATMENT  Most cases of vertigo resolve on their own, without treatment. Depending on the cause, your caregiver may prescribe certain medicines. If your vertigo is related to body position issues, your caregiver may recommend movements or procedures to correct the problem. In rare cases, if your vertigo is caused by certain inner ear problems, you may need surgery. HOME CARE INSTRUCTIONS   Follow your caregiver's instructions.  Avoid driving.  Avoid operating heavy machinery.  Avoid performing any tasks that would be dangerous to you or others during a vertigo episode.  Tell your caregiver if you notice that certain medicines seem to be causing your vertigo. Some of the medicines used to treat vertigo episodes can actually make them worse in some people. SEEK  IMMEDIATE MEDICAL CARE IF:   Your medicines do not relieve your vertigo or are making it worse.  You develop problems with talking, walking, weakness, or using your arms, hands, or legs.  You develop severe headaches.  Your nausea or vomiting continues or gets worse.  You develop visual changes.  A family member notices behavioral changes.  Your condition gets worse. MAKE SURE YOU:  Understand these instructions.  Will watch your condition.  Will get help right away if you are not doing well or get worse. Document Released: 12/29/2004 Document Revised: 06/13/2011 Document Reviewed: 10/07/2010 [facility] Patient Information 2014 [place].   DASH Diet The DASH diet stands for "Dietary Approaches to Stop Hypertension." It is a healthy eating plan that has been shown to reduce high blood pressure (hypertension) in as little as 14 days, while also possibly providing other significant health benefits. These other health benefits include reducing the risk of breast cancer after menopause and reducing the risk of type 2 diabetes, heart disease, colon cancer, and stroke. Health benefits also include weight loss and slowing kidney failure in patients with chronic kidney disease.  DIET GUIDELINES  Limit salt (sodium). Your diet should contain less than 1500 mg of sodium daily.  Limit refined or processed carbohydrates. Your diet should include mostly whole grains. Desserts and added sugars should be used sparingly.  Include small amounts of heart-healthy fats. These types of fats include nuts, oils, and tub margarine. Limit saturated and trans fats. These fats have been shown to be harmful in the body. CHOOSING FOODS  The following food groups are based  on a 2000 calorie diet. See your Registered Dietitian for individual calorie needs. Grains and Grain Products (6 to 8 servings daily)  Eat More Often: Whole-wheat bread, [place] rice, whole-grain or wheat pasta, quinoa, popcorn without  added fat or salt (air popped).  Eat Less Often: White bread, white pasta, white rice, cornbread. Vegetables (4 to 5 servings daily)  Eat More Often: Fresh, frozen, and canned vegetables. Vegetables may be raw, steamed, roasted, or grilled with a minimal amount of fat.  Eat Less Often/Avoid: Creamed or fried vegetables. Vegetables in a cheese sauce. Fruit (4 to 5 servings daily)  Eat More Often: All fresh, canned (in natural juice), or frozen fruits. Dried fruits without added sugar. One hundred percent fruit juice ( cup [237 mL] daily).  Eat Less Often: Dried fruits with added sugar. Canned fruit in light or heavy syrup. [employer], Fish, and Poultry (2 servings or less daily. One serving is 3 to 4 oz [85-114 g]).  Eat More Often: Ninety percent or leaner ground beef, tenderloin, sirloin. Round cuts of beef, chicken breast, [place] breast. All fish. Grill, bake, or broil your meat. Nothing should be fried.  Eat Less Often/Avoid: Fatty cuts of meat, [place], or chicken leg, thigh, or wing. Fried cuts of meat or fish. Dairy (2 to 3 servings)  Eat More Often: Low-fat or fat-free milk, low-fat plain or light yogurt, reduced-fat or part-skim cheese.  Eat Less Often/Avoid: Milk (whole, 2%).Whole milk yogurt. Full-fat cheeses. Nuts, Seeds, and Legumes (4 to 5 servings per week)  Eat More Often: All without added salt.  Eat Less Often/Avoid: Salted nuts and seeds, canned beans with added salt. Fats and Sweets (limited)  Eat More Often: Vegetable oils, tub margarines without trans fats, sugar-free gelatin. Mayonnaise and salad dressings.  Eat Less Often/Avoid: Coconut oils, palm oils, butter, stick margarine, cream, half and half, cookies, candy, pie. FOR MORE INFORMATION The Dash Diet Eating Plan: www.dashdiet.org Document Released: 03/10/2011 Document Revised: 06/13/2011 Document Reviewed: 03/10/2011 [facility] Patient Information 2014 [place], [place].   Modified semont maneuver

## 2013-07-18 NOTE — Progress Notes (Signed)
Patient ID: Patrick Bell, male   DOB: [birth year], 73 y.o.   MRN: [id number] SUBJECTIVE: CC: Chief Complaint  Patient presents with  . Follow-up    2 MONTH FOLLOW UP CHRONIC PROBLEMS c/o allergies congestion dizzy x 3 days wants labs  checked    HPI:  Patient is here for follow up of hyperlipidemia/sleep apnea/GERD/HTN: denies Headache;denies Chest Pain;denies weakness;denies Shortness of Breath and orthopnea;denies Visual changes;denies palpitations;denies cough;denies pedal edema;denies symptoms of TIA or stroke;deniesClaudication symptoms. admits to Compliance with medications; denies Problems with medications.  Room was spinning but almost gone. Has been having vertigo for 3 days. No headache. No neurologic  Symptoms.  Past Medical History  Diagnosis Date  . Asthma   . Hypertension   . Diverticulitis   . GERD (gastroesophageal reflux disease)   . Hyperlipidemia    Past Surgical History  Procedure Laterality Date  . Hernia repair    . Palate / uvula biopsy / excision    . Colonoscopy  06/09/2004    [id number] colon diverticulosis, otherwise normal colonoscopy  . Bilateral inguinal hernia     History   Social History  . Marital Status: Married    Spouse Name: N/A    Number of Children: N/A  . Years of Education: N/A   Occupational History  . retired     truck driving   Social History Main Topics  . Smoking status: Former Smoker    Types: Cigarettes    Quit date: 06/23/1982  . Smokeless tobacco: Not on file  . Alcohol Use: 0.0 oz/week     Comment: brandy a few times a month, beer once a month  . Drug Use: No  . Sexual Activity: Not on file   Other Topics Concern  . Not on file   Social History Narrative  . No narrative on file   Family History  Problem Relation Age of Onset  . Heart disease Mother   . Arthritis Father   . Colon cancer Neg Hx    Current Outpatient Prescriptions on File Prior to Visit  Medication Sig Dispense Refill  . albuterol  (PROVENTIL HFA;VENTOLIN HFA) 108 (90 BASE) MCG/ACT inhaler Inhale 2 puffs into the lungs every 6 (six) hours as needed for wheezing.  1 Inhaler  2  . amLODipine (NORVASC) 5 MG tablet TAKE 1 TABLET DAILY  30 tablet  11  . fexofenadine-pseudoephedrine (ALLEGRA-D ALLERGY & CONGESTION) 60-120 MG per tablet TAKE  (1)  TABLET TWICE A DAY.  60 tablet  3  . montelukast (SINGULAIR) 10 MG tablet TAKE 1 TABLET DAILY  30 tablet  5  . pantoprazole (PROTONIX) 40 MG tablet Take 1 tablet (40 mg total) by mouth daily.  30 tablet  3  . PATADAY 0.2 % SOLN INSTILL 1 DROP IN EACH EYE TWICE DAILY  2.5 mL  2  . simvastatin (ZOCOR) 20 MG tablet TAKE ONE TABLET AT BEDTIME  30 tablet  11   No current facility-administered medications on file prior to visit.   No Known Allergies Immunization History  Administered Date(s) Administered  . Influenza,inj,Quad PF,36+ Mos 01/14/2013  . Pneumococcal Polysaccharide-23 01/14/2013  . Zoster 06/14/2011   Prior to Admission medications   Medication Sig Start Date End Date Taking? Authorizing Provider  albuterol (PROVENTIL HFA;VENTOLIN HFA) 108 (90 BASE) MCG/ACT inhaler Inhale 2 puffs into the lungs every 6 (six) hours as needed for wheezing. 01/14/13  Yes Vernie Shanks, MD  amLODipine (NORVASC) 5 MG tablet TAKE 1 TABLET DAILY 01/14/13  Yes Vernie Shanks, MD  fexofenadine-pseudoephedrine (ALLEGRA-D ALLERGY & CONGESTION) 60-120 MG per tablet TAKE  (1)  TABLET TWICE A DAY. 01/14/13  Yes Vernie Shanks, MD  montelukast (SINGULAIR) 10 MG tablet TAKE 1 TABLET DAILY 01/14/13  Yes Vernie Shanks, MD  pantoprazole (PROTONIX) 40 MG tablet Take 1 tablet (40 mg total) by mouth daily. 01/14/13  Yes Vernie Shanks, MD  PATADAY 0.2 % SOLN INSTILL 1 DROP IN [facility] EYE TWICE DAILY 08/22/12  Yes [name], FNP  simvastatin (ZOCOR) 20 MG tablet TAKE ONE TABLET AT BEDTIME 05/09/13  Yes Vernie Shanks, MD     ROS: As above in the HPI. All other systems are stable or  negative.  OBJECTIVE: APPEARANCE:  Patient in no acute distress.The patient appeared well nourished and normally developed. Acyanotic. Waist: VITAL SIGNS:BP 122/71  Pulse 62  Temp(Src) 97.1 F (36.2 C) (Oral)  Ht 5' 10"  (1.778 m)  Wt 191 lb 3.2 oz (86.728 kg)  BMI 27.43 kg/m2  WM SKIN: warm and  Dry without overt rashes, tattoos and scars  HEAD and Neck: without JVD, Head and scalp: normal Eyes:No scleral icterus. Fundi normal, eye movements normal. Ears: Auricle normal, canal normal, Tympanic membranes normal, insufflation normal. Nose: normal Throat: normal Neck & thyroid: normal  CHEST & LUNGS: Chest wall: normal Lungs: Clear  CVS: Reveals the PMI to be normally located. Regular rhythm, First and Second Heart sounds are normal,  absence of murmurs, rubs or gallops. Peripheral vasculature: Radial pulses: normal Dorsal pedis pulses: normal Posterior pulses: normal  ABDOMEN:  Appearance: normal Benign, no organomegaly, no masses, no Abdominal Aortic enlargement. No Guarding , no rebound. No Bruits. Bowel sounds: normal  RECTAL: N/A GU: N/A  EXTREMETIES: nonedematous.  MUSCULOSKELETAL:  Spine: normal Joints: intact  NEUROLOGIC: oriented to time,place and person; nonfocal. Strength is normal Sensory is normal Reflexes are normal Cranial Nerves are normal.  Dix halpike is mildly positive.  Results for orders placed in visit on 07/18/13  POCT CBC      Result Value Ref Range   WBC 5.8  4.6 - 10.2 K/uL   Lymph, poc 1.2  0.6 - 3.4   POC LYMPH PERCENT 20.9  10 - 50 %L   POC Granulocyte 4.3  2 - 6.9   Granulocyte percent 74.7  37 - 80 %G   RBC 5.3  4.69 - 6.13 M/uL   Hemoglobin 14.7  14.1 - 18.1 g/dL   HCT, POC 45.9  43.5 - 53.7 %   MCV 86.9  80 - 97 fL   MCH, POC 27.9  27 - 31.2 pg   MCHC 32.1  31.8 - 35.4 g/dL   RDW, POC 14.2     Platelet Count, POC 218.0  142 - 424 K/uL   MPV 8.4  0 - 99.8 fL    ASSESSMENT: SLEEP APNEA  Hyperlipidemia -  Plan: Lipid panel  Essential hypertension, benign - Plan: CMP14+EGFR  PREMATURE VENTRICULAR CONTRACTIONS - Plan: TSH  GERD (gastroesophageal reflux disease) - Plan: POCT CBC  DIVERTICULAR DISEASE  BACK PAIN, CHRONIC  ASTHMA  Benign paroxysmal positional vertigo - Plan: POCT CBC  Unspecified vitamin D deficiency - Plan: Vit D  25 hydroxy (rtn osteoporosis monitoring)  PLAN: Balanced lifestyle. Same medications. Orders Placed This Encounter  Procedures  . CMP14+EGFR  . Lipid panel  . TSH  . Vit D  25 hydroxy (rtn osteoporosis monitoring)  . POCT CBC  copy of modified semont maneuvers.  Copies of  handouts in the AVS.  No orders of the defined types were placed in this encounter.   There are no discontinued medications. Return in about 4 months (around 11/17/2013) for Recheck medical problems.  Renly Roots P. Jacelyn Grip, M.D.

## 2013-07-19 ENCOUNTER — Ambulatory Visit: Payer: Medicare Other | Admitting: Family Medicine

## 2013-07-19 LAB — LIPID PANEL
Chol/HDL Ratio: 2.5 ratio units (ref 0.0–5.0)
Cholesterol, Total: 184 mg/dL (ref 100–199)
HDL: 75 mg/dL (ref >39)
LDL Calculated: 87 mg/dL (ref 0–99)
Triglycerides: 110 mg/dL (ref 0–149)
VLDL Cholesterol Cal: 22 mg/dL (ref 5–40)

## 2013-07-19 LAB — CMP14+EGFR
ALT: 6 IU/L (ref 0–44)
AST: 15 IU/L (ref 0–40)
Albumin/Globulin Ratio: 2.7 — ABNORMAL HIGH (ref 1.1–2.5)
Albumin: 4.3 g/dL (ref 3.5–4.8)
Alkaline Phosphatase: 63 IU/L (ref 39–117)
BUN/Creatinine Ratio: 14 (ref 10–22)
BUN: 12 mg/dL (ref 8–27)
CO2: 24 mmol/L (ref 18–29)
Calcium: 8.8 mg/dL (ref 8.6–10.2)
Chloride: 102 mmol/L (ref 97–108)
Creatinine, Ser: 0.84 mg/dL (ref 0.76–1.27)
GFR calc Af Amer: 100 mL/min/{1.73_m2} (ref >59)
GFR calc non Af Amer: 87 mL/min/{1.73_m2} (ref >59)
Globulin, Total: 1.6 g/dL (ref 1.5–4.5)
Glucose: 73 mg/dL (ref 65–99)
Potassium: 4 mmol/L (ref 3.5–5.2)
Sodium: 141 mmol/L (ref 134–144)
Total Bilirubin: 0.5 mg/dL (ref 0.0–1.2)
Total Protein: 5.9 g/dL — ABNORMAL LOW (ref 6.0–8.5)

## 2013-07-19 LAB — VITAMIN D 25 HYDROXY (VIT D DEFICIENCY, FRACTURES): Vit D, 25-Hydroxy: 16.7 ng/mL — ABNORMAL LOW (ref 30.0–100.0)

## 2013-07-19 LAB — TSH: TSH: 3.62 u[IU]/mL (ref 0.450–4.500)

## 2013-07-21 NOTE — Progress Notes (Signed)
Quick Note:  Call Patient Labs that are abnormal:  Vitamin D is low The rest are at goal  Recommendations: Start on Vitamin D 2,000 units daily.   ______

## 2013-07-22 ENCOUNTER — Telehealth: Payer: Self-pay | Admitting: Family Medicine

## 2013-07-22 NOTE — Telephone Encounter (Signed)
Patient aware.

## 2013-11-01 ENCOUNTER — Ambulatory Visit: Payer: Medicare Other

## 2014-02-12 ENCOUNTER — Other Ambulatory Visit: Payer: Self-pay | Admitting: *Deleted

## 2014-02-12 DIAGNOSIS — J45909 Unspecified asthma, uncomplicated: Secondary | ICD-10-CM

## 2014-02-12 DIAGNOSIS — I10 Essential (primary) hypertension: Secondary | ICD-10-CM

## 2014-02-12 MED ORDER — MONTELUKAST SODIUM 10 MG PO TABS: ORAL_TABLET | ORAL | Status: DC

## 2014-02-12 MED ORDER — AMLODIPINE BESYLATE 5 MG PO TABS
ORAL_TABLET | ORAL | Status: DC
Start: 2014-02-12 — End: 2014-03-14

## 2014-03-07 ENCOUNTER — Ambulatory Visit: Payer: Medicare Other

## 2014-03-14 ENCOUNTER — Ambulatory Visit (INDEPENDENT_AMBULATORY_CARE_PROVIDER_SITE_OTHER): Payer: Medicare Other | Admitting: Pharmacist

## 2014-03-14 ENCOUNTER — Ambulatory Visit (INDEPENDENT_AMBULATORY_CARE_PROVIDER_SITE_OTHER): Payer: Medicare Other | Admitting: *Deleted

## 2014-03-14 ENCOUNTER — Encounter: Payer: Self-pay | Admitting: Pharmacist

## 2014-03-14 VITALS — BP 140/70 | HR 78 | Ht 69.0 in | Wt 189.0 lb

## 2014-03-14 DIAGNOSIS — J302 Other seasonal allergic rhinitis: Secondary | ICD-10-CM

## 2014-03-14 DIAGNOSIS — Z23 Encounter for immunization: Secondary | ICD-10-CM

## 2014-03-14 DIAGNOSIS — E785 Hyperlipidemia, unspecified: Secondary | ICD-10-CM

## 2014-03-14 DIAGNOSIS — O019 Hydatidiform mole, unspecified: Secondary | ICD-10-CM

## 2014-03-14 DIAGNOSIS — Z0000 Encounter for general adult medical examination without abnormal findings: Secondary | ICD-10-CM

## 2014-03-14 DIAGNOSIS — J4520 Mild intermittent asthma, uncomplicated: Secondary | ICD-10-CM

## 2014-03-14 DIAGNOSIS — R5382 Chronic fatigue, unspecified: Secondary | ICD-10-CM

## 2014-03-14 DIAGNOSIS — R7989 Other specified abnormal findings of blood chemistry: Secondary | ICD-10-CM | POA: Insufficient documentation

## 2014-03-14 DIAGNOSIS — K219 Gastro-esophageal reflux disease without esophagitis: Secondary | ICD-10-CM

## 2014-03-14 DIAGNOSIS — I10 Essential (primary) hypertension: Secondary | ICD-10-CM

## 2014-03-14 DIAGNOSIS — J45909 Unspecified asthma, uncomplicated: Secondary | ICD-10-CM

## 2014-03-14 MED ORDER — MONTELUKAST SODIUM 10 MG PO TABS: ORAL_TABLET | ORAL | Status: DC

## 2014-03-14 MED ORDER — AMLODIPINE BESYLATE 5 MG PO TABS: ORAL_TABLET | ORAL | Status: DC

## 2014-03-14 MED ORDER — SIMVASTATIN 20 MG PO TABS: ORAL_TABLET | ORAL | Status: DC

## 2014-03-14 MED ORDER — OLOPATADINE HCL 0.2 % OP SOLN: OPHTHALMIC | Status: DC

## 2014-03-14 MED ORDER — PANTOPRAZOLE SODIUM 40 MG PO TBEC: 40.0000 mg | DELAYED_RELEASE_TABLET | Freq: Every day | ORAL | Status: DC

## 2014-03-14 MED ORDER — ALBUTEROL SULFATE HFA 108 (90 BASE) MCG/ACT IN AERS: 2.0000 | INHALATION_SPRAY | Freq: Four times a day (QID) | RESPIRATORY_TRACT | Status: DC | PRN

## 2014-03-14 MED ORDER — FEXOFENADINE-PSEUDOEPHED ER 60-120 MG PO TB12: ORAL_TABLET | ORAL | Status: DC

## 2014-03-14 NOTE — Patient Instructions (Signed)
Preventive Care for Adults A healthy lifestyle and preventive care can promote health and wellness. Preventive health guidelines for men include the following key practices:  A routine yearly physical is a good way to check with your health care provider about your health and preventative screening. It is a chance to share any concerns and updates on your health and to receive a thorough exam.  Visit your dentist for a routine exam and preventative care every 6 months. Brush your teeth twice a day and floss once a day. Good oral hygiene prevents tooth decay and gum disease.  The frequency of eye exams is based on your age, health, family medical history, use of contact lenses, and other factors. Follow your health care provider's recommendations for frequency of eye exams.  Eat a healthy diet. Foods such as vegetables, fruits, whole grains, low-fat dairy products, and lean protein foods contain the nutrients you need without too many calories. Decrease your intake of foods high in solid fats, added sugars, and salt. Eat the right amount of calories for you.Get information about a proper diet from your health care provider, if necessary.  Regular physical exercise is one of the most important things you can do for your health. Most adults should get at least 150 minutes of moderate-intensity exercise (any activity that increases your heart rate and causes you to sweat) each week. In addition, most adults need muscle-strengthening exercises on 2 or more days a week.  Maintain a healthy weight. The body mass index (BMI) is a screening tool to identify possible weight problems. It provides an estimate of body fat based on height and weight. Your health care provider can find your BMI and can help you achieve or maintain a healthy weight.For adults 20 years and older:  A BMI below 18.5 is considered underweight.  A BMI of 18.5 to 24.9 is normal.  A BMI of 25 to 29.9 is considered overweight.  A BMI  of 30 and above is considered obese.  Maintain normal blood lipids and cholesterol levels by exercising and minimizing your intake of saturated fat. Eat a balanced diet with plenty of fruit and vegetables. Blood tests for lipids and cholesterol should begin at age 50 and be repeated every 5 years. If your lipid or cholesterol levels are high, you are over 50, or you are at high risk for heart disease, you may need your cholesterol levels checked more frequently.Ongoing high lipid and cholesterol levels should be treated with medicines if diet and exercise are not working.  If you smoke, find out from your health care provider how to quit. If you do not use tobacco, do not start.  Lung cancer screening is recommended for adults aged 73-80 years who are at high risk for developing lung cancer because of a history of smoking. A yearly low-dose CT scan of the lungs is recommended for people who have at least a 30-pack-year history of smoking and are a current smoker or have quit within the past 15 years. A pack year of smoking is smoking an average of 1 pack of cigarettes a day for 1 year (for example: 1 pack a day for 30 years or 2 packs a day for 15 years). Yearly screening should continue until the smoker has stopped smoking for at least 15 years. Yearly screening should be stopped for people who develop a health problem that would prevent them from having lung cancer treatment.  If you choose to drink alcohol, do not have more than  2 drinks per day. One drink is considered to be 12 ounces (355 mL) of beer, 5 ounces (148 mL) of wine, or 1.5 ounces (44 mL) of liquor.  Avoid use of street drugs. Do not share needles with anyone. Ask for help if you need support or instructions about stopping the use of drugs.  High blood pressure causes heart disease and increases the risk of stroke. Your blood pressure should be checked at least every 1-2 years. Ongoing high blood pressure should be treated with  medicines, if weight loss and exercise are not effective.  If you are 45-79 years old, ask your health care provider if you should take aspirin to prevent heart disease.  Diabetes screening involves taking a blood sample to check your fasting blood sugar level. This should be done once every 3 years, after age 45, if you are within normal weight and without risk factors for diabetes. Testing should be considered at a younger age or be carried out more frequently if you are overweight and have at least 1 risk factor for diabetes.  Colorectal cancer can be detected and often prevented. Most routine colorectal cancer screening begins at the age of 50 and continues through age 75. However, your health care provider may recommend screening at an earlier age if you have risk factors for colon cancer. On a yearly basis, your health care provider may provide home test kits to check for hidden blood in the stool. Use of a small camera at the end of a tube to directly examine the colon (sigmoidoscopy or colonoscopy) can detect the earliest forms of colorectal cancer. Talk to your health care provider about this at age 50, when routine screening begins. Direct exam of the colon should be repeated every 5-10 years through age 75, unless early forms of precancerous polyps or small growths are found.  People who are at an increased risk for hepatitis B should be screened for this virus. You are considered at high risk for hepatitis B if:  You were born in a country where hepatitis B occurs often. Talk with your health care provider about which countries are considered high risk.  Your parents were born in a high-risk country and you have not received a shot to protect against hepatitis B (hepatitis B vaccine).  You have HIV or AIDS.  You use needles to inject street drugs.  You live with, or have sex with, someone who has hepatitis B.  You are a man who has sex with other men (MSM).  You get hemodialysis  treatment.  You take certain medicines for conditions such as cancer, organ transplantation, and autoimmune conditions.  Hepatitis C blood testing is recommended for all people born from 1945 through 1965 and any individual with known risks for hepatitis C.  Practice safe sex. Use condoms and avoid high-risk sexual practices to reduce the spread of sexually transmitted infections (STIs). STIs include gonorrhea, chlamydia, syphilis, trichomonas, herpes, HPV, and human immunodeficiency virus (HIV). Herpes, HIV, and HPV are viral illnesses that have no cure. They can result in disability, cancer, and death.  If you are at risk of being infected with HIV, it is recommended that you take a prescription medicine daily to prevent HIV infection. This is called preexposure prophylaxis (PrEP). You are considered at risk if:  You are a man who has sex with other men (MSM) and have other risk factors.  You are a heterosexual man, are sexually active, and are at increased risk for HIV infection.    You take drugs by injection.  You are sexually active with a partner who has HIV.  Talk with your health care provider about whether you are at high risk of being infected with HIV. If you choose to begin PrEP, you should first be tested for HIV. You should then be tested every 3 months for as long as you are taking PrEP.  A one-time screening for abdominal aortic aneurysm (AAA) and surgical repair of large AAAs by ultrasound are recommended for men ages 32 to 67 years who are current or former smokers.  Healthy men should no longer receive prostate-specific antigen (PSA) blood tests as part of routine cancer screening. Talk with your health care provider about prostate cancer screening.  Testicular cancer screening is not recommended for adult males who have no symptoms. Screening includes self-exam, a health care provider exam, and other screening tests. Consult with your health care provider about any symptoms  you have or any concerns you have about testicular cancer.  Use sunscreen. Apply sunscreen liberally and repeatedly throughout the day. You should seek shade when your shadow is shorter than you. Protect yourself by wearing long sleeves, pants, a wide-brimmed hat, and sunglasses year round, whenever you are outdoors.  Once a month, do a whole-body skin exam, using a mirror to look at the skin on your back. Tell your health care provider about new moles, moles that have irregular borders, moles that are larger than a pencil eraser, or moles that have changed in shape or color.  Stay current with required vaccines (immunizations).  Influenza vaccine. All adults should be immunized every year.  Tetanus, diphtheria, and acellular pertussis (Td, Tdap) vaccine. An adult who has not previously received Tdap or who does not know his vaccine status should receive 1 dose of Tdap. This initial dose should be followed by tetanus and diphtheria toxoids (Td) booster doses every 10 years. Adults with an unknown or incomplete history of completing a 3-dose immunization series with Td-containing vaccines should begin or complete a primary immunization series including a Tdap dose. Adults should receive a Td booster every 10 years.  Varicella vaccine. An adult without evidence of immunity to varicella should receive 2 doses or a second dose if he has previously received 1 dose.  Human papillomavirus (HPV) vaccine. Males aged 68-21 years who have not received the vaccine previously should receive the 3-dose series. Males aged 22-26 years may be immunized. Immunization is recommended through the age of 6 years for any male who has sex with males and did not get any or all doses earlier. Immunization is recommended for any person with an immunocompromised condition through the age of 49 years if he did not get any or all doses earlier. During the 3-dose series, the second dose should be obtained 4-8 weeks after the first  dose. The third dose should be obtained 24 weeks after the first dose and 16 weeks after the second dose.  Zoster vaccine. One dose is recommended for adults aged 50 years or older unless certain conditions are present.  Measles, mumps, and rubella (MMR) vaccine. Adults born before [birth year] generally are considered immune to measles and mumps. Adults born in [birth year] or later should have 1 or more doses of MMR vaccine unless there is a contraindication to the vaccine or there is laboratory evidence of immunity to each of the three diseases. A routine second dose of MMR vaccine should be obtained at least 28 days after the first dose for students attending postsecondary  schools, health care workers, or international travelers. People who received inactivated measles vaccine or an unknown type of measles vaccine during 1963-1967 should receive 2 doses of MMR vaccine. People who received inactivated mumps vaccine or an unknown type of mumps vaccine before 1979 and are at high risk for mumps infection should consider immunization with 2 doses of MMR vaccine. Unvaccinated health care workers born before 1957 who lack laboratory evidence of measles, mumps, or rubella immunity or laboratory confirmation of disease should consider measles and mumps immunization with 2 doses of MMR vaccine or rubella immunization with 1 dose of MMR vaccine.  Pneumococcal 13-valent conjugate (PCV13) vaccine. When indicated, a person who is uncertain of his immunization history and has no record of immunization should receive the PCV13 vaccine. An adult aged 19 years or older who has certain medical conditions and has not been previously immunized should receive 1 dose of PCV13 vaccine. This PCV13 should be followed with a dose of pneumococcal polysaccharide (PPSV23) vaccine. The PPSV23 vaccine dose should be obtained at least 8 weeks after the dose of PCV13 vaccine. An adult aged 19 years or older who has certain medical conditions and  previously received 1 or more doses of PPSV23 vaccine should receive 1 dose of PCV13. The PCV13 vaccine dose should be obtained 1 or more years after the last PPSV23 vaccine dose.  Pneumococcal polysaccharide (PPSV23) vaccine. When PCV13 is also indicated, PCV13 should be obtained first. All adults aged 65 years and older should be immunized. An adult younger than age 65 years who has certain medical conditions should be immunized. Any person who resides in a nursing home or long-term care facility should be immunized. An adult smoker should be immunized. People with an immunocompromised condition and certain other conditions should receive both PCV13 and PPSV23 vaccines. People with human immunodeficiency virus (HIV) infection should be immunized as soon as possible after diagnosis. Immunization during chemotherapy or radiation therapy should be avoided. Routine use of PPSV23 vaccine is not recommended for American Indians, [place] Natives, or people younger than 65 years unless there are medical conditions that require PPSV23 vaccine. When indicated, people who have unknown immunization and have no record of immunization should receive PPSV23 vaccine. One-time revaccination 5 years after the first dose of PPSV23 is recommended for people aged 19-64 years who have chronic kidney failure, nephrotic syndrome, asplenia, or immunocompromised conditions. People who received 1-2 doses of PPSV23 before age 65 years should receive another dose of PPSV23 vaccine at age 65 years or later if at least 5 years have passed since the previous dose. Doses of PPSV23 are not needed for people immunized with PPSV23 at or after age 65 years.  Meningococcal vaccine. Adults with asplenia or persistent complement component deficiencies should receive 2 doses of quadrivalent meningococcal conjugate (MenACWY-D) vaccine. The doses should be obtained at least 2 months apart. Microbiologists working with certain meningococcal bacteria,  military recruits, people at risk during an outbreak, and people who travel to or live in countries with a high rate of meningitis should be immunized. A first-year college student up through age 21 years who is living in a residence hall should receive a dose if he did not receive a dose on or after his 16th birthday. Adults who have certain high-risk conditions should receive one or more doses of vaccine.  Hepatitis A vaccine. Adults who wish to be protected from this disease, have certain high-risk conditions, work with hepatitis A-infected animals, work in hepatitis A research labs, or   travel to or work in countries with a high rate of hepatitis A should be immunized. Adults who were previously unvaccinated and who anticipate close contact with an international adoptee during the first 60 days after arrival in the [place] from a country with a high rate of hepatitis A should be immunized.  Hepatitis B vaccine. Adults should be immunized if they wish to be protected from this disease, have certain high-risk conditions, may be exposed to blood or other infectious body fluids, are household contacts or sex partners of hepatitis B positive people, are clients or workers in certain care facilities, or travel to or work in countries with a high rate of hepatitis B.  Haemophilus influenzae type b (Hib) vaccine. A previously unvaccinated person with asplenia or sickle cell disease or having a scheduled splenectomy should receive 1 dose of Hib vaccine. Regardless of previous immunization, a recipient of a hematopoietic stem cell transplant should receive a 3-dose series 6-12 months after his successful transplant. Hib vaccine is not recommended for adults with HIV infection. Preventive Service / Frequency Ages 52 to 17  Blood pressure check.** / Every 1 to 2 years.  Lipid and cholesterol check.** / Every 5 years beginning at age 69.  Hepatitis C blood test.** / For any individual with known risks for  hepatitis C.  Skin self-exam. / Monthly.  Influenza vaccine. / Every year.  Tetanus, diphtheria, and acellular pertussis (Tdap, Td) vaccine.** / Consult your health care provider. 1 dose of Td every 10 years.  Varicella vaccine.** / Consult your health care provider.  HPV vaccine. / 3 doses over 6 months, if 72 or younger.  Measles, mumps, rubella (MMR) vaccine.** / You need at least 1 dose of MMR if you were born in 1957 or later. You may also need a second dose.  Pneumococcal 13-valent conjugate (PCV13) vaccine.** / Consult your health care provider.  Pneumococcal polysaccharide (PPSV23) vaccine.** / 1 to 2 doses if you smoke cigarettes or if you have certain conditions.  Meningococcal vaccine.** / 1 dose if you are age 35 to 60 years and a Market researcher living in a residence hall, or have one of several medical conditions. You may also need additional booster doses.  Hepatitis A vaccine.** / Consult your health care provider.  Hepatitis B vaccine.** / Consult your health care provider.  Haemophilus influenzae type b (Hib) vaccine.** / Consult your health care provider. Ages 35 to 8  Blood pressure check.** / Every 1 to 2 years.  Lipid and cholesterol check.** / Every 5 years beginning at age 57.  Lung cancer screening. / Every year if you are aged 44-80 years and have a 30-pack-year history of smoking and currently smoke or have quit within the past 15 years. Yearly screening is stopped once you have quit smoking for at least 15 years or develop a health problem that would prevent you from having lung cancer treatment.  Fecal occult blood test (FOBT) of stool. / Every year beginning at age 55 and continuing until age 73. You may not have to do this test if you get a colonoscopy every 10 years.  Flexible sigmoidoscopy** or colonoscopy.** / Every 5 years for a flexible sigmoidoscopy or every 10 years for a colonoscopy beginning at age 28 and continuing until age  1.  Hepatitis C blood test.** / For all people born from [birth year] through 1965 and any individual with known risks for hepatitis C.  Skin self-exam. / Monthly.  Influenza vaccine. / Every  year.  Tetanus, diphtheria, and acellular pertussis (Tdap/Td) vaccine.** / Consult your health care provider. 1 dose of Td every 10 years.  Varicella vaccine.** / Consult your health care provider.  Zoster vaccine.** / 1 dose for adults aged 53 years or older.  Measles, mumps, rubella (MMR) vaccine.** / You need at least 1 dose of MMR if you were born in 1957 or later. You may also need a second dose.  Pneumococcal 13-valent conjugate (PCV13) vaccine.** / Consult your health care provider.  Pneumococcal polysaccharide (PPSV23) vaccine.** / 1 to 2 doses if you smoke cigarettes or if you have certain conditions.  Meningococcal vaccine.** / Consult your health care provider.  Hepatitis A vaccine.** / Consult your health care provider.  Hepatitis B vaccine.** / Consult your health care provider.  Haemophilus influenzae type b (Hib) vaccine.** / Consult your health care provider. Ages 77 and over  Blood pressure check.** / Every 1 to 2 years.  Lipid and cholesterol check.**/ Every 5 years beginning at age 85.  Lung cancer screening. / Every year if you are aged 55-80 years and have a 30-pack-year history of smoking and currently smoke or have quit within the past 15 years. Yearly screening is stopped once you have quit smoking for at least 15 years or develop a health problem that would prevent you from having lung cancer treatment.  Fecal occult blood test (FOBT) of stool. / Every year beginning at age 33 and continuing until age 11. You may not have to do this test if you get a colonoscopy every 10 years.  Flexible sigmoidoscopy** or colonoscopy.** / Every 5 years for a flexible sigmoidoscopy or every 10 years for a colonoscopy beginning at age 28 and continuing until age 73.  Hepatitis C blood  test.** / For all people born from [birth year] through 1965 and any individual with known risks for hepatitis C.  Abdominal aortic aneurysm (AAA) screening.** / A one-time screening for ages 50 to 27 years who are current or former smokers.  Skin self-exam. / Monthly.  Influenza vaccine. / Every year.  Tetanus, diphtheria, and acellular pertussis (Tdap/Td) vaccine.** / 1 dose of Td every 10 years.  Varicella vaccine.** / Consult your health care provider.  Zoster vaccine.** / 1 dose for adults aged 34 years or older.  Pneumococcal 13-valent conjugate (PCV13) vaccine.** / Consult your health care provider.  Pneumococcal polysaccharide (PPSV23) vaccine.** / 1 dose for all adults aged 63 years and older.  Meningococcal vaccine.** / Consult your health care provider.  Hepatitis A vaccine.** / Consult your health care provider.  Hepatitis B vaccine.** / Consult your health care provider.  Haemophilus influenzae type b (Hib) vaccine.** / Consult your health care provider. **Family history and personal history of risk and conditions may change your health care provider's recommendations. Document Released: 05/17/2001 Document Revised: 03/26/2013 Document Reviewed: 08/16/2010 [facility] Patient Information 2015 [place], [place]. This information is not intended to replace advice given to you by your health care provider. Make sure you discuss any questions you have with your health care provider.

## 2014-03-14 NOTE — Progress Notes (Signed)
Patient ID: Patrick Bell, male   DOB: 1940-05-11, 73 y.o.   MRN: [id number] Subjective:    Patrick Bell is a 73 y.o. male who presents for Medicare Initial Wellness Visit.  Patient mentions that he would like a sleep study.  He had discussed this in the past with Dr Jacelyn Grip. He reports that he wife complains that he stops breathing during sleep.  Patient also mentions an area / mole on his temple (left) and right shoulder that he would like biopsied.  Requests referral to dermatologist.    Preventive Screening-Counseling & Management  Tobacco History  Smoking status  . Former Smoker  . Types: Cigarettes  . Quit date: 06/23/1982  Smokeless tobacco  . Not on file   Current Problems (verified) Patient Active Problem List   Diagnosis Date Noted  . Low serum vitamin D 03/14/2014  . HLD (hyperlipidemia) 05/09/2013  . Hyperlipidemia   . GERD (gastroesophageal reflux disease) 08/03/2012  . Essential hypertension, benign 08/19/2008  . HYPOKALEMIA 08/18/2008  . DIVERTICULAR DISEASE 08/18/2008  . BACK PAIN, CHRONIC 08/18/2008  . SLEEP APNEA 08/18/2008  . DYSPNEA 08/18/2008  . PREMATURE VENTRICULAR CONTRACTIONS 07/23/2008  . ASTHMA 07/23/2008  . CHEST PAIN UNSPECIFIED 07/23/2008  . ALLERGY 07/23/2008    Medications Prior to Visit Current Outpatient Prescriptions on File Prior to Visit  Medication Sig Dispense Refill  . albuterol (PROVENTIL HFA;VENTOLIN HFA) 108 (90 BASE) MCG/ACT inhaler Inhale 2 puffs into the lungs every 6 (six) hours as needed for wheezing. 1 Inhaler 2  . amLODipine (NORVASC) 5 MG tablet TAKE 1 TABLET DAILY 30 tablet 0  . fexofenadine-pseudoephedrine (ALLEGRA-D ALLERGY & CONGESTION) 60-120 MG per tablet TAKE  (1)  TABLET TWICE A DAY. (Patient taking differently: Take 1 tablet by mouth 2 (two) times daily as needed. TAKE  (1)  TABLET TWICE A DAY.) 60 tablet 3  . montelukast (SINGULAIR) 10 MG tablet TAKE 1 TABLET DAILY 30 tablet 0  . PATADAY 0.2 % SOLN INSTILL 1  DROP IN EACH EYE TWICE DAILY 2.5 mL 2  . simvastatin (ZOCOR) 20 MG tablet TAKE ONE TABLET AT BEDTIME 30 tablet 11  . pantoprazole (PROTONIX) 40 MG tablet Take 1 tablet (40 mg total) by mouth daily. (Patient not taking: Reported on 03/14/2014) 30 tablet 3   No current facility-administered medications on file prior to visit.    Current Medications (verified) Current Outpatient Prescriptions  Medication Sig Dispense Refill  . albuterol (PROVENTIL HFA;VENTOLIN HFA) 108 (90 BASE) MCG/ACT inhaler Inhale 2 puffs into the lungs every 6 (six) hours as needed for wheezing. 1 Inhaler 2  . amLODipine (NORVASC) 5 MG tablet TAKE 1 TABLET DAILY 30 tablet 0  . fexofenadine-pseudoephedrine (ALLEGRA-D ALLERGY & CONGESTION) 60-120 MG per tablet TAKE  (1)  TABLET TWICE A DAY. (Patient taking differently: Take 1 tablet by mouth 2 (two) times daily as needed. TAKE  (1)  TABLET TWICE A DAY.) 60 tablet 3  . montelukast (SINGULAIR) 10 MG tablet TAKE 1 TABLET DAILY 30 tablet 0  . PATADAY 0.2 % SOLN INSTILL 1 DROP IN EACH EYE TWICE DAILY 2.5 mL 2  . simvastatin (ZOCOR) 20 MG tablet TAKE ONE TABLET AT BEDTIME 30 tablet 11  . pantoprazole (PROTONIX) 40 MG tablet Take 1 tablet (40 mg total) by mouth daily. (Patient not taking: Reported on 03/14/2014) 30 tablet 3   No current facility-administered medications for this visit.     Allergies (verified) Review of patient's allergies indicates no known allergies.   PAST  HISTORY  Family History Family History  Problem Relation Age of Onset  . Heart disease Mother   . Hip fracture Mother 74  . Osteoporosis Mother   . Dementia Mother 43  . Arthritis Father   . Colon cancer Neg Hx   . Arthritis Sister     Social History History  Substance Use Topics  . Smoking status: Former Smoker    Types: Cigarettes    Quit date: 06/23/1982  . Smokeless tobacco: Not on file  . Alcohol Use: 0.0 oz/week     Comment: brandy a few times a month, beer once a month    Are  there smokers in your home (other than you)?  No  Risk Factors Current exercise habits: Home exercise routine includes walking daily.  Dietary issues discussed: follows low fat diet   Cardiac risk factors: advanced age (older than 45 for men, 79 for women), dyslipidemia, hypertension and male gender.  Depression Screen (Note: if answer to either of the following is "Yes", a more complete depression screening is indicated)   Q1: Over the past two weeks, have you felt down, depressed or hopeless? No  Q2: Over the past two weeks, have you felt little interest or pleasure in doing things? No  Have you lost interest or pleasure in daily life? No  Do you often feel hopeless? No  Do you cry easily over simple problems? No  Activities of Daily Living In your present state of health, do you have any difficulty performing the following activities?:  Driving? No Managing money?  No Feeding yourself? No Getting from bed to chair? No Climbing a flight of stairs? No Preparing food and eating?: No Bathing or showering? No Getting dressed: No Getting to the toilet? No Using the toilet:No Moving around from place to place: No In the past year have you fallen or had a near fall?:No   Are you sexually active?  Yes  Do you have more than one partner?  No  Hearing Difficulties: No Do you often ask people to speak up or repeat themselves? No Do you experience ringing or noises in your ears? No Do you have difficulty understanding soft or whispered voices? No   Do you feel that you have a problem with memory? No  Do you often misplace items? No  Do you feel safe at home?  Yes  Cognitive Testing  Alert? Yes  Normal Appearance?Yes  Oriented to person? Yes  Place? Yes   Time? Yes  Recall of three objects?  Yes  Can perform simple calculations? Yes  Displays appropriate judgment?Yes  Can read the correct time from a watch face?Yes   Advanced Directives have been discussed with the patient?  Yes   List the Names of Other Physician/Practitioners you currently use: 1.  Optometrist - Dr Barry Dienes any recent Medical Services you may have received from other than [facility] providers in the past year (date may be approximate).  Immunization History  Administered Date(s) Administered  . Influenza,inj,Quad PF,36+ Mos 01/14/2013  . Pneumococcal Polysaccharide-23 01/14/2013  . Zoster 06/14/2011    Screening Tests Health Maintenance  Topic Date Due  . INFLUENZA VACCINE  11/02/2013  . TETANUS/TDAP  04/05/2015  . COLONOSCOPY  04/04/2017  . PNEUMOCOCCAL POLYSACCHARIDE VACCINE AGE 26 AND OVER  Completed  . ZOSTAVAX  Completed    All answers were reviewed with the patient and necessary referrals were made:  Cherre Robins, [facility]   03/14/2014   History reviewed: allergies, current medications,  past family history, past medical history, past social history, past surgical history and problem list   Objective:     Blood pressure 140/70, pulse 78, height 5\' 9"  (1.753 m), weight 189 lb (85.73 kg). Body mass index is 27.9 kg/(m^2).  General appearance: alert, cooperative and no distress Head: Normocephalic, without obvious abnormality,      Assessment:     Annual Medicare Wellness Visit History of Low vitamin D - not taking supplement currently  Poor sleep / fatigue Abnormal skin growth / mole     Plan:     During the course of the visit the patient was educated and counseled about appropriate screening and preventive services including:    Pneumococcal vaccine - Prevnar 13 given in office today  Influenza vaccine - given in office today  Td vaccine - UTD  Colorectal cancer screening - UTD  Diabetes screening - UTD  Glaucoma screening - last eye exam 3 years ago, patient to make appt  Referral for sleep study  Referral to dermatologist   Recommended daily probiotic like Align  Restart daily vitamin D 2000IU daily  Nutrition counseling - referral to  dietician for discussion of diet related to GI health and hyperlipidemia  Rx's refilled Advanced directives: has NO advanced directive - patient states he has information at home already.  Diet review for nutrition referral? Yes __X__  Not Indicated ____   Patient Instructions (the written plan) was given to the patient.  Medicare Attestation I have personally reviewed: The patient's medical and social history Their use of alcohol, tobacco or illicit drugs Their current medications and supplements The patient's functional ability including ADLs,fall risks, home safety risks, cognitive, and hearing and visual impairment Diet and physical activities Evidence for depression or mood disorders  The patient's weight, height, BMI, and visual acuity have been recorded in the chart.  I have made referrals, counseling, and provided education to the patient based on review of the above and I have provided the patient with a written personalized care plan for preventive services.     Cherre Robins, [facility]   03/14/2014

## 2014-03-20 ENCOUNTER — Encounter: Payer: Self-pay | Admitting: Pulmonary Disease

## 2014-03-20 ENCOUNTER — Ambulatory Visit (INDEPENDENT_AMBULATORY_CARE_PROVIDER_SITE_OTHER): Payer: Medicare Other | Admitting: Pulmonary Disease

## 2014-03-20 VITALS — BP 118/76 | HR 71 | Temp 97.7°F | Ht 70.0 in | Wt 193.8 lb

## 2014-03-20 DIAGNOSIS — R0683 Snoring: Secondary | ICD-10-CM

## 2014-03-20 NOTE — Progress Notes (Signed)
Chief Complaint  Patient presents with  . SLEEP CONSULT    Referred by Dr Laurance Flatten. Sleep Study 16 years ago. Epworth Score: 17    History of Present Illness: Patrick Bell is a 73 y.o. male for evaluation of sleep problems.  He had sleep study about 16 yrs ago while living in [place].  He was found to have moderate sleep apnea.  He had UPPP.  This helped initially.  Over the past few years his snoring has recurred.  His wife has told him he stops breathing and will get choking spells at night.  He is feeling tired all the time.  As a result he decide to have sleep evaluation.  He goes to sleep between 930 and 11 pm.  He falls asleep quickly.  He wakes up occasionally times to use the bathroom.  He gets out of bed at 6 am.  He feels okay in the morning, but then feels like he wants to nap an hour after waking up.  He denies morning headache.  He does not use anything to help him fall sleep.  He drinks 3 cups of coffee and 2 cans of coke per day.  He gets cramps in his calves at night almost every night..  He denies sleep walking, sleep talking, bruxism, or nightmares.  There is no history of restless legs.  He denies sleep hallucinations, sleep paralysis, or cataplexy.  The Epworth score is 17 out of 24.  Tests:   JARRIS KORTZ  has a past medical history of Asthma; Hypertension; Diverticulitis; GERD (gastroesophageal reflux disease); Hyperlipidemia; and Cataract.  [name]  has past surgical history that includes Hernia repair; Palate / uvula biopsy / excision; Colonoscopy (06/09/2004); bilateral inguinal hernia; Tonsillectomy; and Vasectomy.  Prior to Admission medications   Medication Sig Start Date End Date Taking? Authorizing Provider  albuterol (PROVENTIL HFA;VENTOLIN HFA) 108 (90 BASE) MCG/ACT inhaler Inhale 2 puffs into the lungs every 6 (six) hours as needed for wheezing. 03/14/14  Yes [name], FNP  amLODipine (NORVASC) 5 MG tablet TAKE 1 TABLET DAILY  03/14/14  Yes [name], FNP  fexofenadine-pseudoephedrine (ALLEGRA-D ALLERGY & CONGESTION) 60-120 MG per tablet TAKE  (1)  TABLET TWICE A DAY. 03/14/14  Yes [name], FNP  montelukast (SINGULAIR) 10 MG tablet TAKE 1 TABLET DAILY 03/14/14  Yes [name], FNP  Olopatadine HCl (PATADAY) 0.2 % SOLN INSTILL 1 DROP IN [facility] EYE TWICE DAILY Patient taking differently: INSTILL 1 DROP IN [facility] EYE PRN in [place] 03/14/14  Yes [name], FNP  pantoprazole (PROTONIX) 40 MG tablet Take 1 tablet (40 mg total) by mouth daily. 03/14/14  Yes [name], FNP  simvastatin (ZOCOR) 20 MG tablet TAKE ONE TABLET AT BEDTIME 03/14/14  Yes [name], FNP    No Known Allergies  His family history includes Arthritis in his father and sister; Dementia (age of onset: 76) in his mother; Heart disease in his father and mother; Hip fracture (age of onset: 70) in his mother; Osteoporosis in his mother. There is no history of Colon cancer.  He  reports that he quit smoking about 31 years ago. His smoking use included Cigarettes. He has a 100 pack-year smoking history. He does not have any smokeless tobacco history on file. He reports that he drinks alcohol. He reports that he does not use illicit drugs.  Review of Systems  Constitutional: Negative for fever and unexpected weight change.  HENT: Positive for congestion. Negative for dental  problem, ear pain, nosebleeds, postnasal drip, rhinorrhea, sinus pressure, sneezing, sore throat and trouble swallowing.   Eyes: Negative for redness and itching.  Respiratory: Negative for cough, chest tightness, shortness of breath and wheezing.   Cardiovascular: Negative for palpitations and leg swelling.  Gastrointestinal: Negative for nausea and vomiting.       Acid heartburn//Indigestion  Genitourinary: Positive for frequency. Negative for dysuria.  Musculoskeletal: Negative for joint swelling.  Skin: Negative for  rash.  Neurological: Negative for headaches.  Hematological: Does not bruise/bleed easily.  Psychiatric/Behavioral: Negative for dysphoric mood. The patient is not nervous/anxious.    Physical Exam: Blood pressure 118/76, pulse 71, temperature 97.7 F (36.5 C), temperature source Oral, height 5\' 10"  (1.778 m), weight 193 lb 12.8 oz (87.907 kg), SpO2 95 %. Body mass index is 27.81 kg/(m^2).  General - No distress ENT - No sinus tenderness, no oral exudate, no LAN, no thyromegaly, TM clear, pupils equal/reactive, changes of UPPP, high arched palate, enlarged tongue Cardiac - s1s2 regular, no murmur, pulses symmetric Chest - No wheeze/rales/dullness, good air entry, normal respiratory excursion Back - No focal tenderness Abd - Soft, non-tender, no organomegaly, + bowel sounds Ext - No edema Neuro - Normal strength, cranial nerves intact Skin - No rashes Psych - Normal mood, and behavior  Discussion: He has prior history of OSA and s/p UPPP.  He had initial improvement, but has progressive symptoms suggestive of recurrent sleep apnea.    Assessment/plan:  Snoring >> likely recurrence of sleep apnea. We discussed how sleep apnea can affect various health problems including risks for hypertension, cardiovascular disease, and diabetes.  We also discussed how sleep disruption can increase risks for accident, such as while driving.  Weight loss as a means of improving sleep apnea was also reviewed.  Additional treatment options discussed were CPAP therapy, oral appliance, and surgical intervention. Plan: - will arrange for in lab sleep study to further assess  Nocturnal leg cramps. Plan: - re-assess after sleep study reviewed   Chesley Mires, M.D. Pager [phone]

## 2014-03-20 NOTE — Patient Instructions (Signed)
Will arrange for sleep study Will call to arrange for follow up after sleep study reviewed 

## 2014-03-20 NOTE — Progress Notes (Deleted)
   Subjective:    Patient ID: Patrick Bell, male    DOB: [birth year], 73 y.o.   MRN: [id number]  HPI    Review of Systems  Constitutional: Negative for fever and unexpected weight change.  HENT: Positive for congestion. Negative for dental problem, ear pain, nosebleeds, postnasal drip, rhinorrhea, sinus pressure, sneezing, sore throat and trouble swallowing.   Eyes: Negative for redness and itching.  Respiratory: Negative for cough, chest tightness, shortness of breath and wheezing.   Cardiovascular: Negative for palpitations and leg swelling.  Gastrointestinal: Negative for nausea and vomiting.       Acid heartburn//Indigestion  Genitourinary: Positive for frequency. Negative for dysuria.  Musculoskeletal: Negative for joint swelling.  Skin: Negative for rash.  Neurological: Negative for headaches.  Hematological: Does not bruise/bleed easily.  Psychiatric/Behavioral: Negative for dysphoric mood. The patient is not nervous/anxious.        Objective:   Physical Exam        Assessment & Plan:

## 2014-04-30 DIAGNOSIS — L820 Inflamed seborrheic keratosis: Secondary | ICD-10-CM | POA: Diagnosis not present

## 2014-04-30 DIAGNOSIS — L570 Actinic keratosis: Secondary | ICD-10-CM | POA: Diagnosis not present

## 2014-04-30 DIAGNOSIS — D485 Neoplasm of uncertain behavior of skin: Secondary | ICD-10-CM | POA: Diagnosis not present

## 2014-05-01 ENCOUNTER — Ambulatory Visit (HOSPITAL_BASED_OUTPATIENT_CLINIC_OR_DEPARTMENT_OTHER): Payer: Medicare Other | Attending: Pulmonary Disease

## 2014-05-01 VITALS — Ht 70.0 in | Wt 190.0 lb

## 2014-05-01 DIAGNOSIS — G4733 Obstructive sleep apnea (adult) (pediatric): Secondary | ICD-10-CM | POA: Insufficient documentation

## 2014-05-01 DIAGNOSIS — R0683 Snoring: Secondary | ICD-10-CM | POA: Insufficient documentation

## 2014-05-07 ENCOUNTER — Encounter: Payer: Medicare Other | Attending: Family Medicine | Admitting: Nutrition

## 2014-05-07 ENCOUNTER — Telehealth: Payer: Self-pay | Admitting: Pulmonary Disease

## 2014-05-07 ENCOUNTER — Encounter: Payer: Self-pay | Admitting: Nutrition

## 2014-05-07 VITALS — Ht 70.0 in | Wt 191.5 lb

## 2014-05-07 DIAGNOSIS — K2100 Gastro-esophageal reflux disease with esophagitis, without bleeding: Secondary | ICD-10-CM

## 2014-05-07 DIAGNOSIS — G4733 Obstructive sleep apnea (adult) (pediatric): Secondary | ICD-10-CM

## 2014-05-07 DIAGNOSIS — E785 Hyperlipidemia, unspecified: Secondary | ICD-10-CM

## 2014-05-07 NOTE — Sleep Study (Signed)
[place]  NAME: Patrick Bell DATE OF BIRTH:  [birth year] MEDICAL RECORD NUMBER [id number]  LOCATION: [facility]  PHYSICIAN: Chesley Mires, M.D. DATE OF STUDY: 05/01/2014  SLEEP STUDY TYPE: Split night protocol               REFERRING PHYSICIAN: Chesley Mires, MD  INDICATION FOR STUDY:  Patrick Bell is a 74 y.o. male who presents to the sleep lab for evaluation of hypersomnia with obstructive sleep apnea.  He reports snoring, sleep disruption, apnea, and daytime sleepiness.  He has prior history of OSA and is s/p UPPP.  He had initial improvement after surgery, but since then his symptoms have recurred.  EPWORTH SLEEPINESS SCORE: 17. HEIGHT: 5\' 10"  (177.8 cm)  WEIGHT: 190 lb (86.183 kg)    Body mass index is 27.26 kg/(m^2).  NECK SIZE: 15 in.  MEDICATIONS:  Current Outpatient Prescriptions on File Prior to Visit  Medication Sig Dispense Refill  . albuterol (PROVENTIL HFA;VENTOLIN HFA) 108 (90 BASE) MCG/ACT inhaler Inhale 2 puffs into the lungs every 6 (six) hours as needed for wheezing. 1 Inhaler 2  . amLODipine (NORVASC) 5 MG tablet TAKE 1 TABLET DAILY 90 tablet 1  . fexofenadine-pseudoephedrine (ALLEGRA-D ALLERGY & CONGESTION) 60-120 MG per tablet TAKE  (1)  TABLET TWICE A DAY. 180 tablet 1  . montelukast (SINGULAIR) 10 MG tablet TAKE 1 TABLET DAILY 90 tablet 1  . Olopatadine HCl (PATADAY) 0.2 % SOLN INSTILL 1 DROP IN [facility] EYE TWICE DAILY (Patient taking differently: INSTILL 1 DROP IN EACH EYE PRN in [place] AND FALL) 2.5 mL 2  . pantoprazole (PROTONIX) 40 MG tablet Take 1 tablet (40 mg total) by mouth daily. 30 tablet 3  . simvastatin (ZOCOR) 20 MG tablet TAKE ONE TABLET AT BEDTIME 90 tablet 1   No current facility-administered medications on file prior to visit.    SLEEP ARCHITECTURE:  Diagnostic portion: Total recording time: 161.5 minutes.  Total sleep time was: 132 minutes.  Sleep efficiency: 81.7%.  Sleep latency: 9.5 minutes.   REM latency: N/A minutes.  Stage N1: 18.9%.  Stage N2: 81.1%.  Stage N3: 0%.  Stage R:  0%.  Supine sleep: 68.5 minutes.  Non-supine sleep: 63.5 minutes.  Titration portion: Total recording time: 229 minutes.  Total sleep time was: 182 minutes.  Sleep efficiency: 79.5%.  Sleep latency: 0 minutes.  REM latency: 67.5 minutes.  Stage N1: 11.8%.  Stage N2: 60.7%.  Stage N3: 0%.  Stage R:  27.5%.  Supine sleep: 182 minutes.  Non-supine sleep: 0 minutes.  CARDIAC DATA:  Average heart rate: 66 beats per minute. Rhythm strip: sinus rhythm with sinus arrhythmia and PVCs.  RESPIRATORY DATA: Average respiratory rate: 16. Snoring: loud.  Diagnostic portion: Average AHI: 29.5.   Apnea index: 26.8.  Hypopnea index: 2.7. Obstructive apnea index: 26.8.  Central apnea index: 0.  Mixed apnea index: 0. REM AHI: N/A.  NREM AHI: 29.5. Supine AHI: 56.9. Non-supine AHI: 0.  Titration portion: He was started on CPAP 4 and increased to CPAP 18 cm H2O.  He was also tried on BiPAP 18/14 and increased to 21/17 cm H2O.  He had good control of his obstructive sleep apnea with CPAP 9 cm H2O.  He developed significant central apneas/hypopneas with higher pressure settings that were not improved with BiPAP.  MOVEMENT/PARASOMNIA:  Diagnostic portion: Periodic limb movement: 5.  Period limb movements with arousals: 0.  Titration portion: Periodic limb movement: 1.6.  Period  limb movements with arousals: 0.  Restroom trips: 1.  OXYGEN DATA:  Baseline oxygenation: 94%. Lowest SaO2: 82%. Time spent below SaO2 90%: 12.4 minutes. Supplemental oxygen used: none.  IMPRESSION/ RECOMMENDATION:   This study shows moderate to severe obstructive sleep apnea with an AHI of 29.5 and SaO2 low of 82%.  His optimal CPAP setting appeared to be CPAP 9 cm H2O.  With higher pressure settings he was noted to developed significant central apneas and hypopneas.  He was fitted with a Fisher Paykel large size Simplus full face  mask.   Chesley Mires, M.D. Diplomate, Tax adviser of Sleep Medicine  ELECTRONICALLY SIGNED ON:  05/07/2014, 7:33 AM [place] PH: [phone]   FX: [phone] [place]

## 2014-05-07 NOTE — Patient Instructions (Signed)
Plan 1. Eat healthy balanced meals per Plate Method; Follow Meal Plan Card (Yellow) 2. Cut out cookies, sweets, cakes, sodas 3. Eat baked and broiled foods. 4. Increase fresh fruits and vegetables. 5. Avoid fried foods, spearmint, peppermint and gas producing foods due to reflux 6. Cut out sodas and cut down on coffee 7. Drink more water -64 oz per day. 8. Exercise 30-45 minutes most days of the week. 9. Increase fiber in diet-see handout. 10. Follow My Plate with proper balanced foods and proper food portions as discussed. 11. Lose 1 lb per week. 12. Do not eat after 8 pm.

## 2014-05-07 NOTE — Telephone Encounter (Signed)
PSG 05/01/14 >> AHI 29.5, SaO2 low 82%, Supine 56.9, PLMI 5.  CPAP 9 cm H2O >> AHI 9, centrals with higher pressures.   Will have my nurse inform pt that sleep study shows moderate to severe sleep apnea.  He did better with CPAP in second half of test.  Options are 1) CPAP set up now, or 2) ROV first.  If he is agreeable to CPAP set up now, then please arrange for CPAP 9 cm H2O with heated humidity and mask of choice.  Have download sent 1 month after starting CPAP and ROV 2 months after starting CPAP.

## 2014-05-07 NOTE — Progress Notes (Signed)
  Medical Nutrition Therapy:  Appt start time: 1100 end time:  1200.  Assessment:  Primary concerns today: Hyperlipidemia and GERD.  Lives with his wife.Wants to lose weight. LIkes caffeine-drinks a lot of coffee, pepsi and cokes. Admits to eating a lot of sweets, cookies, and high fat foods. Use to be a smoker of 4 packets. Lipid profile is WNL on medications.  Eats late at night and complains of reflux symptoms at night and during the day. Wants to lose about 10 lbs. Is currently active with walking daily but wants to join a gym to work on weight training and tone up.  Preferred Learning Style:    No preference indicated   Learning Readiness:    Ready  Change in progress  MEDICATIONS: See list   DIETARY INTAKE:    24-hr recall:  B ( AM): Cookies; OR eggs, bacon, coffee  Snk ( AM): none  L ( PM): Sandwich, soup or hamburger with coke or Pepsi. Snk ( PM): pb and bread or cookies:(oreas/sandies) D ( PM): Toss salad and 2 slices pizza, Snk ( PM): cookies, chiops Beverages: water, pepsi/cokes, tea  Usual physical activity: walks most days of the week.  Estimated energy needs: 1800 calories 200 g carbohydrates 135 g protein 50 g fat  Progress Towards Goal(s):  In progress.   Nutritional Diagnosis:  NB-1.1 Food and nutrition-related knowledge deficit As related to Hyperlipidemia and GERD.  As evidenced by high fat, high sugar diet with indigestion, heartburn and history elevated cholesterol now on medications..    Intervention:  Nutrition counseling on a Low Fat, High Fiber Diet, meal planning, portion control, My Plate, and foods to avoid due to GERD and benefits of exercise for desired 10 lbs weight loss and risks for cardiovascular disease from high fat high sugar diet.  Plan  1.Eat healthy balanced meals per Plate Method; Follow Meal Plan Card (Yellow) 2. Cut out cookies, sweets, cakes, sodas 3. Eat baked and broiled foods. 4. Increase fresh fruits and vegetables. 5.  Avoid fried foods, spearmint, peppermint and gas producing foods due to reflux 6. Cut out sodas and cut down on coffee 7. Drink more water -64 oz per day. 8. Exercise 30-45 minutes most days of the week. 9. Increase fiber in diet-see handout. 10. Follow My Plate with proper balanced foods and proper food portions as discussed. 11. Lose 1 lb per week. 12. Do not eat after 8 pm.  Teaching Method Utilized:  Visual Auditory Hands on  Handouts given during visit include: My Plate Meal Plan Card GERD Handout High Fiber Diet  Barriers to learning/adherence to lifestyle change: none  Demonstrated degree of understanding via:  Teach Back   Monitoring/Evaluation:  Dietary intake, exercise, reflux symptoms and body weight in 3 month(s).

## 2014-05-12 DIAGNOSIS — D3132 Benign neoplasm of left choroid: Secondary | ICD-10-CM | POA: Diagnosis not present

## 2014-05-12 DIAGNOSIS — H2513 Age-related nuclear cataract, bilateral: Secondary | ICD-10-CM | POA: Diagnosis not present

## 2014-05-14 NOTE — Telephone Encounter (Signed)
Pt calling for results of sleep study, and is also needing an appointment.Patrick Bell

## 2014-05-14 NOTE — Telephone Encounter (Signed)
Spoke with the pt and notified of results/recs per VS  Pt verbalized understanding  He would like to go ahead and get set up on CPAP and wants to use [facility]  I have sent order to [facility]  He will call for 2 month appt after CPAP is started

## 2014-05-15 ENCOUNTER — Telehealth: Payer: Self-pay | Admitting: Pulmonary Disease

## 2014-05-15 NOTE — Telephone Encounter (Signed)
Called and spoke with patient and [facility] as previously requested by patient does not supply cpap machines.  Pt decided to have order sent to [facility] in [place], which is only about 12 miles from pt's house.  Staff message sent to Darlina Guys with [facility] to notify her of this referral. Contact patient on mobile phone to arrange set up at [facility] in [place].  Nothing else needed at this time. Rhonda J Cobb

## 2014-05-15 NOTE — Telephone Encounter (Signed)
Order was placed yesterday. Pt needs this to be sent to [facility]. Please advise thanks

## 2014-05-21 DIAGNOSIS — G4733 Obstructive sleep apnea (adult) (pediatric): Secondary | ICD-10-CM | POA: Diagnosis not present

## 2014-05-21 DIAGNOSIS — R0683 Snoring: Secondary | ICD-10-CM | POA: Diagnosis not present

## 2014-05-27 ENCOUNTER — Telehealth: Payer: Self-pay | Admitting: Pulmonary Disease

## 2014-05-27 NOTE — Telephone Encounter (Signed)
Noted  

## 2014-05-28 DIAGNOSIS — D225 Melanocytic nevi of trunk: Secondary | ICD-10-CM | POA: Diagnosis not present

## 2014-05-28 DIAGNOSIS — L570 Actinic keratosis: Secondary | ICD-10-CM | POA: Diagnosis not present

## 2014-05-28 DIAGNOSIS — L814 Other melanin hyperpigmentation: Secondary | ICD-10-CM | POA: Diagnosis not present

## 2014-05-28 DIAGNOSIS — D1801 Hemangioma of skin and subcutaneous tissue: Secondary | ICD-10-CM | POA: Diagnosis not present

## 2014-05-28 DIAGNOSIS — L821 Other seborrheic keratosis: Secondary | ICD-10-CM | POA: Diagnosis not present

## 2014-05-30 ENCOUNTER — Telehealth: Payer: Self-pay | Admitting: Pulmonary Disease

## 2014-05-30 DIAGNOSIS — G4733 Obstructive sleep apnea (adult) (pediatric): Secondary | ICD-10-CM

## 2014-05-30 NOTE — Telephone Encounter (Signed)
Called and spoke to pt. Pt stated he feels his pressure is not enough. Pt's wife stated he is still having apneic periods during the night. Pt stated [facility] was suppose to send DL. DL has not been received. DL from [employer] and given to Ashtyn to give to VS on Monday. Pt aware VS will not be able to view DL till 06/02/14.   VS please advise on pt's pressure recs. Thanks.

## 2014-06-02 NOTE — Telephone Encounter (Signed)
Called and spoke to pt. Informed pt of the recs per VS. Order placed. Pt verbalized understanding and denied any further questions or concerns at this time.

## 2014-06-02 NOTE — Telephone Encounter (Signed)
CPAP 04/30/14 to 05/29/14 >> used on 9 of 30 nights with average 5 hrs and 37 min.  Average AHI is 19.2 with CPAP 9 cm H2O.   Will have my nurse send order to change him to auto CPAP with range of 5 to 15 cm H2O.  Please have download sent 2 weeks after this pressure change.

## 2014-06-05 ENCOUNTER — Encounter (HOSPITAL_BASED_OUTPATIENT_CLINIC_OR_DEPARTMENT_OTHER): Payer: Medicare Other

## 2014-06-05 DIAGNOSIS — G4733 Obstructive sleep apnea (adult) (pediatric): Secondary | ICD-10-CM | POA: Diagnosis not present

## 2014-06-13 ENCOUNTER — Encounter: Payer: Self-pay | Admitting: *Deleted

## 2014-06-19 DIAGNOSIS — M5137 Other intervertebral disc degeneration, lumbosacral region: Secondary | ICD-10-CM | POA: Diagnosis not present

## 2014-06-19 DIAGNOSIS — I973 Postprocedural hypertension: Secondary | ICD-10-CM | POA: Diagnosis not present

## 2014-06-19 DIAGNOSIS — M9903 Segmental and somatic dysfunction of lumbar region: Secondary | ICD-10-CM | POA: Diagnosis not present

## 2014-06-19 DIAGNOSIS — M9901 Segmental and somatic dysfunction of cervical region: Secondary | ICD-10-CM | POA: Diagnosis not present

## 2014-06-19 DIAGNOSIS — G4733 Obstructive sleep apnea (adult) (pediatric): Secondary | ICD-10-CM | POA: Diagnosis not present

## 2014-06-19 DIAGNOSIS — M9902 Segmental and somatic dysfunction of thoracic region: Secondary | ICD-10-CM | POA: Diagnosis not present

## 2014-06-23 DIAGNOSIS — I973 Postprocedural hypertension: Secondary | ICD-10-CM | POA: Diagnosis not present

## 2014-06-23 DIAGNOSIS — M9902 Segmental and somatic dysfunction of thoracic region: Secondary | ICD-10-CM | POA: Diagnosis not present

## 2014-06-23 DIAGNOSIS — M9901 Segmental and somatic dysfunction of cervical region: Secondary | ICD-10-CM | POA: Diagnosis not present

## 2014-06-23 DIAGNOSIS — M9903 Segmental and somatic dysfunction of lumbar region: Secondary | ICD-10-CM | POA: Diagnosis not present

## 2014-06-23 DIAGNOSIS — M5137 Other intervertebral disc degeneration, lumbosacral region: Secondary | ICD-10-CM | POA: Diagnosis not present

## 2014-06-25 DIAGNOSIS — M9901 Segmental and somatic dysfunction of cervical region: Secondary | ICD-10-CM | POA: Diagnosis not present

## 2014-06-25 DIAGNOSIS — M9903 Segmental and somatic dysfunction of lumbar region: Secondary | ICD-10-CM | POA: Diagnosis not present

## 2014-06-25 DIAGNOSIS — M9902 Segmental and somatic dysfunction of thoracic region: Secondary | ICD-10-CM | POA: Diagnosis not present

## 2014-06-25 DIAGNOSIS — M5137 Other intervertebral disc degeneration, lumbosacral region: Secondary | ICD-10-CM | POA: Diagnosis not present

## 2014-06-25 DIAGNOSIS — I973 Postprocedural hypertension: Secondary | ICD-10-CM | POA: Diagnosis not present

## 2014-06-26 DIAGNOSIS — M9901 Segmental and somatic dysfunction of cervical region: Secondary | ICD-10-CM | POA: Diagnosis not present

## 2014-06-26 DIAGNOSIS — M5137 Other intervertebral disc degeneration, lumbosacral region: Secondary | ICD-10-CM | POA: Diagnosis not present

## 2014-06-26 DIAGNOSIS — M9903 Segmental and somatic dysfunction of lumbar region: Secondary | ICD-10-CM | POA: Diagnosis not present

## 2014-06-26 DIAGNOSIS — M9902 Segmental and somatic dysfunction of thoracic region: Secondary | ICD-10-CM | POA: Diagnosis not present

## 2014-06-26 DIAGNOSIS — I973 Postprocedural hypertension: Secondary | ICD-10-CM | POA: Diagnosis not present

## 2014-06-27 ENCOUNTER — Telehealth: Payer: Self-pay | Admitting: Pulmonary Disease

## 2014-06-27 DIAGNOSIS — G4733 Obstructive sleep apnea (adult) (pediatric): Secondary | ICD-10-CM

## 2014-06-27 DIAGNOSIS — Z9989 Dependence on other enabling machines and devices: Principal | ICD-10-CM

## 2014-06-27 NOTE — Telephone Encounter (Signed)
Auto CPAP 06/05/14 to 06/19/14 >> Used on 14 of 15 nights with average 5 hrs 59 min.  Average AHI 13 with median CPAP 12 cm H2O and 95th percentile CPAP 15 cm H2O.  Will have my nurse inform patient that sleep apnea numbers better with CPAP setting change, but still high.  Will change CPAP range to 5 to 20 cm H2O.

## 2014-06-30 DIAGNOSIS — M9903 Segmental and somatic dysfunction of lumbar region: Secondary | ICD-10-CM | POA: Diagnosis not present

## 2014-06-30 DIAGNOSIS — M9902 Segmental and somatic dysfunction of thoracic region: Secondary | ICD-10-CM | POA: Diagnosis not present

## 2014-06-30 DIAGNOSIS — M5137 Other intervertebral disc degeneration, lumbosacral region: Secondary | ICD-10-CM | POA: Diagnosis not present

## 2014-06-30 DIAGNOSIS — I973 Postprocedural hypertension: Secondary | ICD-10-CM | POA: Diagnosis not present

## 2014-06-30 DIAGNOSIS — M9901 Segmental and somatic dysfunction of cervical region: Secondary | ICD-10-CM | POA: Diagnosis not present

## 2014-07-07 DIAGNOSIS — H01001 Unspecified blepharitis right upper eyelid: Secondary | ICD-10-CM | POA: Diagnosis not present

## 2014-07-07 DIAGNOSIS — H43813 Vitreous degeneration, bilateral: Secondary | ICD-10-CM | POA: Diagnosis not present

## 2014-07-07 DIAGNOSIS — H01004 Unspecified blepharitis left upper eyelid: Secondary | ICD-10-CM | POA: Diagnosis not present

## 2014-07-07 DIAGNOSIS — D3132 Benign neoplasm of left choroid: Secondary | ICD-10-CM | POA: Diagnosis not present

## 2014-07-07 DIAGNOSIS — H25813 Combined forms of age-related cataract, bilateral: Secondary | ICD-10-CM | POA: Diagnosis not present

## 2014-07-07 DIAGNOSIS — H527 Unspecified disorder of refraction: Secondary | ICD-10-CM | POA: Diagnosis not present

## 2014-07-10 ENCOUNTER — Ambulatory Visit (INDEPENDENT_AMBULATORY_CARE_PROVIDER_SITE_OTHER): Payer: Medicare Other | Admitting: Pulmonary Disease

## 2014-07-10 ENCOUNTER — Encounter: Payer: Self-pay | Admitting: Pulmonary Disease

## 2014-07-10 VITALS — BP 122/70 | HR 94 | Temp 97.7°F | Ht 70.0 in | Wt 191.0 lb

## 2014-07-10 DIAGNOSIS — Z9989 Dependence on other enabling machines and devices: Principal | ICD-10-CM

## 2014-07-10 DIAGNOSIS — G4733 Obstructive sleep apnea (adult) (pediatric): Secondary | ICD-10-CM

## 2014-07-10 NOTE — Progress Notes (Signed)
Chief Complaint  Patient presents with  . Follow-up    CPAP compliance- reports poor sleep with machine, pt states that he tolerates machine but sleep has not changed. Pt still having daytime sleepiness and tiredness. States that wife reports snoring still. Denies mask leaks    History of Present Illness: Patrick Bell is a 74 y.o. male with OSA.  He is using CPAP about 4 to 6 hours per night.  He will take mask off after going to bathroom and sleep w/o CPAP for another 1.5 hrs.  He still feels tired, and snores some.    Order was sent to change him to 5 to 20 cm H2O auto CPAP >> download indicates he is still on 5 to 15 cm H2O auto CPAP.   TESTS: PSG 05/01/14 >> AHI 29.5, SaO2 low 82%, Supine 56.9, PLMI 5. CPAP 9 cm H2O >> AHI 9, centrals with higher pressures.  Auto CPAP 06/09/14 to 07/08/14 >> used on 23 of 30 nights with average 6 hrs and 25 min.  Average AHI is 9.3 with median CPAP 11 cm H2O and 95 th percentile CPAP 14 cm H20.  Past medical hx >> Asthma, HTN, HLD, GERD, s/p UPPP  Past surgical hx, Medications, Allergies, Family hx, Social hx all reviewed.  Physical Exam: Blood pressure 122/70, pulse 94, temperature 97.7 F (36.5 C), temperature source Oral, height 5\' 10"  (1.778 m), weight 191 lb (86.637 kg), SpO2 95 %. Body mass index is 27.41 kg/(m^2).  General - No distress ENT - No sinus tenderness, no oral exudate, no LAN, s/p UPPP, high arched palate, enlarged tongue Cardiac - s1s2 regular, no murmur Chest - No wheeze/rales/dullness Back - No focal tenderness Abd - Soft, non-tender Ext - No edema Neuro - Normal strength Skin - No rashes Psych - normal mood, and behavior  Assessment/Plan:  Obstructive sleep apnea. Plan: - will ensure that his CPAP is change to auto with range of 5 to 20 cm H2O - will get download after pressure change - if he is still having issue, then consider BiPAP versus ONO with CPAP  Insufficient sleep. Plan: - advised that most people  need 7 to 8 hrs sleep per night, and he should use CPAP for entire time he is asleep   Chesley Mires, MD [place] [facility] Pager:  [phone]

## 2014-07-10 NOTE — Patient Instructions (Signed)
Will change CPAP to pressure range of 5 to 20 cm H2O Follow up in 6 months

## 2014-07-20 DIAGNOSIS — G4733 Obstructive sleep apnea (adult) (pediatric): Secondary | ICD-10-CM | POA: Diagnosis not present

## 2014-07-23 DIAGNOSIS — H25813 Combined forms of age-related cataract, bilateral: Secondary | ICD-10-CM | POA: Diagnosis not present

## 2014-07-29 ENCOUNTER — Encounter: Payer: Self-pay | Admitting: Family Medicine

## 2014-07-29 ENCOUNTER — Ambulatory Visit (INDEPENDENT_AMBULATORY_CARE_PROVIDER_SITE_OTHER): Payer: Medicare Other | Admitting: Family Medicine

## 2014-07-29 VITALS — BP 131/80 | HR 67 | Temp 97.0°F | Ht 70.0 in | Wt 187.0 lb

## 2014-07-29 DIAGNOSIS — K219 Gastro-esophageal reflux disease without esophagitis: Secondary | ICD-10-CM | POA: Diagnosis not present

## 2014-07-29 DIAGNOSIS — I10 Essential (primary) hypertension: Secondary | ICD-10-CM | POA: Diagnosis not present

## 2014-07-29 DIAGNOSIS — Z0000 Encounter for general adult medical examination without abnormal findings: Secondary | ICD-10-CM

## 2014-07-29 DIAGNOSIS — N400 Enlarged prostate without lower urinary tract symptoms: Secondary | ICD-10-CM | POA: Diagnosis not present

## 2014-07-29 DIAGNOSIS — E785 Hyperlipidemia, unspecified: Secondary | ICD-10-CM | POA: Diagnosis not present

## 2014-07-29 MED ORDER — AMLODIPINE BESYLATE 5 MG PO TABS: 5.0000 mg | ORAL_TABLET | Freq: Every day | ORAL | Status: DC

## 2014-07-29 MED ORDER — PANTOPRAZOLE SODIUM 40 MG PO TBEC: 40.0000 mg | DELAYED_RELEASE_TABLET | Freq: Every day | ORAL | Status: DC

## 2014-07-29 MED ORDER — SIMVASTATIN 20 MG PO TABS: 20.0000 mg | ORAL_TABLET | Freq: Every day | ORAL | Status: DC

## 2014-07-29 NOTE — Patient Instructions (Signed)

## 2014-07-29 NOTE — Progress Notes (Signed)
Subjective:    Patient ID: Patrick Bell, male    DOB: Apr 22, 1940, 74 y.o.   MRN: [id number]  HPI 74 year old gentleman here for an annual physical. He generally is in good health. He has some hypertension and hyperlipidemia and obstructive sleep apnea. He has no new complaints or symptoms today. It has been 1 year since lipids and CMP were checked  Chief Complaint  Patient presents with  . Annual Exam    Patient Active Problem List   Diagnosis Date Noted  . Low serum vitamin D 03/14/2014  . HLD (hyperlipidemia) 05/09/2013  . Hyperlipidemia   . GERD (gastroesophageal reflux disease) 08/03/2012  . Essential hypertension, benign 08/19/2008  . HYPOKALEMIA 08/18/2008  . DIVERTICULAR DISEASE 08/18/2008  . BACK PAIN, CHRONIC 08/18/2008  . OSA (obstructive sleep apnea) 08/18/2008  . DYSPNEA 08/18/2008  . PREMATURE VENTRICULAR CONTRACTIONS 07/23/2008  . ASTHMA 07/23/2008  . CHEST PAIN UNSPECIFIED 07/23/2008  . ALLERGY 07/23/2008   Outpatient Encounter Prescriptions as of 07/29/2014  Medication Sig  . albuterol (PROVENTIL HFA;VENTOLIN HFA) 108 (90 BASE) MCG/ACT inhaler Inhale 2 puffs into the lungs every 6 (six) hours as needed for wheezing.  Marland Kitchen amLODipine (NORVASC) 5 MG tablet TAKE 1 TABLET DAILY  . montelukast (SINGULAIR) 10 MG tablet TAKE 1 TABLET DAILY  . Olopatadine HCl (PATADAY) 0.2 % SOLN INSTILL 1 DROP IN [facility] EYE TWICE DAILY (Patient taking differently: INSTILL 1 DROP IN EACH EYE PRN in [place])  . pantoprazole (PROTONIX) 40 MG tablet Take 1 tablet (40 mg total) by mouth daily.  . simvastatin (ZOCOR) 20 MG tablet TAKE ONE TABLET AT BEDTIME      Review of Systems  Constitutional: Negative.   HENT: Negative.   Eyes: Negative.   Respiratory: Negative.  Negative for shortness of breath.   Cardiovascular: Negative.  Negative for chest pain and leg swelling.  Gastrointestinal: Negative.   Genitourinary: Positive for urgency.  Musculoskeletal: Negative.   Skin:  Negative.   Neurological: Negative.   Psychiatric/Behavioral: Negative.   All other systems reviewed and are negative.      Objective:   Physical Exam  Constitutional: He is oriented to person, place, and time. He appears well-developed and well-nourished.  HENT:  Head: Normocephalic.  Right Ear: External ear normal.  Left Ear: External ear normal.  Nose: Nose normal.  Mouth/Throat: Oropharynx is clear and moist.  Eyes: Conjunctivae and EOM are normal. Pupils are equal, round, and reactive to light.  Neck: Normal range of motion. Neck supple.  Cardiovascular: Normal rate, regular rhythm, normal heart sounds and intact distal pulses.   Pulmonary/Chest: Effort normal and breath sounds normal.  Abdominal: Soft. Bowel sounds are normal.  Musculoskeletal: Normal range of motion.  Neurological: He is alert and oriented to person, place, and time.  Skin: Skin is warm and dry.  Psychiatric: He has a normal mood and affect. His behavior is normal. Judgment and thought content normal.    BP 131/80 mmHg  Pulse 67  Temp(Src) 97 F (36.1 C) (Oral)  Ht 5' 10"  (1.778 m)  Wt 187 lb (84.823 kg)  BMI 26.83 kg/m2       Assessment & Plan:  1. Annual physical exam Exam is normal. Discussed weight even though patient is not obese BMI slightly elevated - CMP14+EGFR - Lipid panel  2. Essential hypertension, benign Blood pressure well controlled on amlodipine. Continue same dose - amLODipine (NORVASC) 5 MG tablet; Take 1 tablet (5 mg total) by mouth daily. TAKE 1 TABLET  DAILY  Dispense: 90 tablet; Refill: 1  3. HLD (hyperlipidemia) Patient has been taking statin sporadically. - simvastatin (ZOCOR) 20 MG tablet; Take 1 tablet (20 mg total) by mouth at bedtime. TAKE ONE TABLET AT BEDTIME  Dispense: 90 tablet; Refill: 1  4. Gastroesophageal reflux disease, esophagitis presence not specified Symptoms are controlled with PPI - pantoprazole (PROTONIX) 40 MG tablet; Take 1 tablet (40 mg total)  by mouth daily.  Dispense: 90 tablet; Refill: 1  5. BPH (benign prostatic hyperplasia) We had a discussion today about PSA. There is no family history of increased risk - PSA, total and free  Wardell Honour MD

## 2014-07-30 LAB — CMP14+EGFR
ALK PHOS: 57 IU/L (ref 39–117)
ALT: 7 IU/L (ref 0–44)
AST: 15 IU/L (ref 0–40)
Albumin/Globulin Ratio: 2.6 — ABNORMAL HIGH (ref 1.1–2.5)
Albumin: 4.1 g/dL (ref 3.5–4.8)
BUN / CREAT RATIO: 16 (ref 10–22)
BUN: 12 mg/dL (ref 8–27)
Bilirubin Total: 0.6 mg/dL (ref 0.0–1.2)
CO2: 24 mmol/L (ref 18–29)
CREATININE: 0.76 mg/dL (ref 0.76–1.27)
Calcium: 8.9 mg/dL (ref 8.6–10.2)
Chloride: 105 mmol/L (ref 97–108)
GFR calc non Af Amer: 89 mL/min/{1.73_m2} (ref >59)
GFR, EST AFRICAN AMERICAN: 103 mL/min/{1.73_m2} (ref >59)
Globulin, Total: 1.6 g/dL (ref 1.5–4.5)
Glucose: 82 mg/dL (ref 65–99)
Potassium: 3.9 mmol/L (ref 3.5–5.2)
Sodium: 144 mmol/L (ref 134–144)
Total Protein: 5.7 g/dL — ABNORMAL LOW (ref 6.0–8.5)

## 2014-07-30 LAB — LIPID PANEL
CHOLESTEROL TOTAL: 195 mg/dL (ref 100–199)
Chol/HDL Ratio: 2.4 ratio units (ref 0.0–5.0)
HDL: 80 mg/dL (ref >39)
LDL Calculated: 103 mg/dL — ABNORMAL HIGH (ref 0–99)
Triglycerides: 59 mg/dL (ref 0–149)
VLDL Cholesterol Cal: 12 mg/dL (ref 5–40)

## 2014-07-30 LAB — PSA, TOTAL AND FREE
PSA FREE: 0.38 ng/mL
PSA, Free Pct: 15.8 %
PSA: 2.4 ng/mL (ref 0.0–4.0)

## 2014-07-31 DIAGNOSIS — H25812 Combined forms of age-related cataract, left eye: Secondary | ICD-10-CM | POA: Diagnosis not present

## 2014-07-31 DIAGNOSIS — K219 Gastro-esophageal reflux disease without esophagitis: Secondary | ICD-10-CM | POA: Diagnosis not present

## 2014-07-31 DIAGNOSIS — E780 Pure hypercholesterolemia: Secondary | ICD-10-CM | POA: Diagnosis not present

## 2014-07-31 DIAGNOSIS — Z961 Presence of intraocular lens: Secondary | ICD-10-CM | POA: Diagnosis not present

## 2014-07-31 DIAGNOSIS — J45909 Unspecified asthma, uncomplicated: Secondary | ICD-10-CM | POA: Diagnosis not present

## 2014-07-31 DIAGNOSIS — Z87891 Personal history of nicotine dependence: Secondary | ICD-10-CM | POA: Diagnosis not present

## 2014-07-31 DIAGNOSIS — G4730 Sleep apnea, unspecified: Secondary | ICD-10-CM | POA: Diagnosis not present

## 2014-07-31 DIAGNOSIS — H2512 Age-related nuclear cataract, left eye: Secondary | ICD-10-CM | POA: Diagnosis not present

## 2014-07-31 DIAGNOSIS — I10 Essential (primary) hypertension: Secondary | ICD-10-CM | POA: Diagnosis not present

## 2014-07-31 DIAGNOSIS — H5212 Myopia, left eye: Secondary | ICD-10-CM | POA: Diagnosis not present

## 2014-08-03 HISTORY — PX: EYE SURGERY: SHX253

## 2014-08-06 ENCOUNTER — Ambulatory Visit: Payer: Medicare Other | Admitting: Nutrition

## 2014-08-06 ENCOUNTER — Telehealth: Payer: Self-pay | Admitting: Pulmonary Disease

## 2014-08-06 DIAGNOSIS — G4733 Obstructive sleep apnea (adult) (pediatric): Secondary | ICD-10-CM

## 2014-08-06 NOTE — Telephone Encounter (Signed)
Per 07/10/14 OV note: Patient Instructions       Will change CPAP to pressure range of 5 to 20 cm H2O Follow up in 6 months  ---  Called spoke with pt. He reports he is having a lot of mask leak once the pressure settings gets high enough and ends up waking him up. He wants to know if the auto setting can be reduced some.   Pt is enrolled in [facility]. Per 30 day download 07/07/14-08/05/14: Pressure - cmH2O Median: 10.8 95th percentile: 18.8 Maximum: 19.6 Leaks - L/min Median: 0.1 95th percentile: 22.2 Maximum: 48.0 Events per hour AI: 6.4 HI: 1.8 AHI: 8.2 Apnea Index Central: 0.8 Obstructive: 5.0 Unknown: 0.5 RERA Index 1.2 ---  Please advise VS thanks

## 2014-08-07 NOTE — Telephone Encounter (Signed)
Put in order to have DME check his mask fitting first.  If this does not fix problem, then auto settings can be adjusted.

## 2014-08-07 NOTE — Telephone Encounter (Signed)
Left message for patient to return call.  Order for mask fitting entered.

## 2014-08-07 NOTE — Telephone Encounter (Signed)
Pt returned call [phone]

## 2014-08-07 NOTE — Telephone Encounter (Signed)
Pt is aware of recs. Nothing further needed 

## 2014-08-27 DIAGNOSIS — L570 Actinic keratosis: Secondary | ICD-10-CM | POA: Diagnosis not present

## 2014-08-27 DIAGNOSIS — L700 Acne vulgaris: Secondary | ICD-10-CM | POA: Diagnosis not present

## 2014-09-11 DIAGNOSIS — Z961 Presence of intraocular lens: Secondary | ICD-10-CM | POA: Diagnosis not present

## 2014-09-13 ENCOUNTER — Other Ambulatory Visit: Payer: Self-pay | Admitting: Nurse Practitioner

## 2014-09-23 DIAGNOSIS — G4733 Obstructive sleep apnea (adult) (pediatric): Secondary | ICD-10-CM | POA: Diagnosis not present

## 2014-10-28 DIAGNOSIS — G4733 Obstructive sleep apnea (adult) (pediatric): Secondary | ICD-10-CM | POA: Diagnosis not present

## 2014-11-18 ENCOUNTER — Telehealth: Payer: Self-pay | Admitting: Pulmonary Disease

## 2014-11-18 NOTE — Telephone Encounter (Signed)
Spoke with [facility] Pt has figured out how to adjust his own pressure setting on CPAP machine Pt was on Auto 5-20 and averaging 19 - pt felt that pressure 19 was too much for him. Pressure was 5-20 back in April and the patient started self adjusting this in May.  Pt has changed his max of pressure to 14 - report printed and placed in look at to advise if max 14 is sufficient or if needs to be back on 19. Please advise Dr Halford Chessman. Thanks.

## 2014-11-19 NOTE — Telephone Encounter (Signed)
Auto CPAP 10/04/14 to 11/17/14 >> used on 37 of 45 nights with average 5 hrs and 9 min.  Average AHI is 11.6 with median CPAP 12 cm H2O and 95 th percentile CPAP 14 cm H20.   Will have my nurse inform pt that CPAP report shows that sleep apnea number is still elevated but better compared to number from sleep study before starting CPAP.  If he is sleeping okay with current CPAP set up, then no change is needed.  If he is still having trouble, then he would need to have in lab CPAP titration study.

## 2014-11-19 NOTE — Telephone Encounter (Signed)
Pt aware of rec's per VS and states that he might actually consider having CPAP Titration study done after Apr 05, 2015.  Pt aware to let us know if there are any issues to come up with his CPAP pressure in the future so that we can make the adjustments.  Explained to him that its best for [place] to be on the same page with him and know what pressure he is on so that we can best treat him.  Expressed understanding and will call when ready to do Titration Study. Nothing further needed.

## 2014-11-26 DIAGNOSIS — L570 Actinic keratosis: Secondary | ICD-10-CM | POA: Diagnosis not present

## 2014-12-03 ENCOUNTER — Encounter: Payer: Self-pay | Admitting: Family Medicine

## 2014-12-03 ENCOUNTER — Ambulatory Visit (INDEPENDENT_AMBULATORY_CARE_PROVIDER_SITE_OTHER): Payer: Medicare Other | Admitting: Family Medicine

## 2014-12-03 ENCOUNTER — Ambulatory Visit (INDEPENDENT_AMBULATORY_CARE_PROVIDER_SITE_OTHER): Payer: Medicare Other

## 2014-12-03 VITALS — BP 138/78 | HR 60 | Temp 97.1°F | Ht 70.0 in | Wt 183.8 lb

## 2014-12-03 DIAGNOSIS — J0110 Acute frontal sinusitis, unspecified: Secondary | ICD-10-CM | POA: Diagnosis not present

## 2014-12-03 DIAGNOSIS — T148 Other injury of unspecified body region: Secondary | ICD-10-CM | POA: Diagnosis not present

## 2014-12-03 DIAGNOSIS — R519 Headache, unspecified: Secondary | ICD-10-CM

## 2014-12-03 DIAGNOSIS — R51 Headache: Secondary | ICD-10-CM

## 2014-12-03 DIAGNOSIS — S70921A Unspecified superficial injury of right thigh, initial encounter: Secondary | ICD-10-CM | POA: Diagnosis not present

## 2014-12-03 DIAGNOSIS — R0989 Other specified symptoms and signs involving the circulatory and respiratory systems: Secondary | ICD-10-CM | POA: Diagnosis not present

## 2014-12-03 DIAGNOSIS — R0981 Nasal congestion: Secondary | ICD-10-CM

## 2014-12-03 DIAGNOSIS — W57XXXA Bitten or stung by nonvenomous insect and other nonvenomous arthropods, initial encounter: Secondary | ICD-10-CM | POA: Diagnosis not present

## 2014-12-03 DIAGNOSIS — J019 Acute sinusitis, unspecified: Secondary | ICD-10-CM | POA: Insufficient documentation

## 2014-12-03 MED ORDER — DOXYCYCLINE HYCLATE 100 MG PO TABS: 100.0000 mg | ORAL_TABLET | Freq: Two times a day (BID) | ORAL | Status: DC

## 2014-12-03 NOTE — Assessment & Plan Note (Addendum)
Patient has headaches in the frontal region and left temporal region. He has chronic sinus issues and allergy issues for which he takes Allegra-D sometimes. Currently he is not taking Allegra-D, recommend for him to start taking Allegra-D along with Flonase over-the-counter. We will also check a chest x-ray because of some congestion and throat and down into the chest with the sinus drainage.

## 2014-12-03 NOTE — Assessment & Plan Note (Signed)
Patient has had a couple of recent tick bites most recently 2 days ago. We will test for Lyme disease even though patient has never had rashes such. Will give patient doxycycline and to lab testing for Lyme disease.

## 2014-12-03 NOTE — Progress Notes (Addendum)
BP 138/78 mmHg  Pulse 60  Temp(Src) 97.1 F (36.2 C) (Oral)  Ht _0  (1.778 m)  Wt 183 lb 12.8 oz (83.371 kg)  BMI 26.37 kg/m2   Subjective:    Patient ID: Patrick Bell, male    DOB: Dec 26, 1940, 74 y.o.   MRN: [id number]  HPI: Patrick Bell is a 73 y.o. male presenting on 12/03/2014 for Dizziness, blurry vision; Weight Loss; and Tick bites   HPI Headache Patient has headaches are coming up recently over the past month. The headache is worse in the left temporal region but also sometimes in the frontal region bilaterally. Patient also complains of some vision issues on the left thigh occasionally. He does complain of nasal congestion sinus pressure and seasonal allergies that arise this time here. He is previously taking Allegra-D is not currently taking it right now.  Tick bite Patient had a tick bite that he found a day and a half to 2 days ago. He believes of the tick was in his body at least overnight. He also had another tick bite a couple months ago. He is coming today because he is concerned about this with his new headaches and just general feeling of malaise and decreased energy. He denies having any rash or targetoid lesions. Denies any fevers or chills. The tick bite that was on his body overnight most recently was on his right lateral thigh. The previous a couple months ago was on his right lower leg.  Relevant past medical, surgical, family and social history reviewed and updated as indicated. Interim medical history since our last visit reviewed. Allergies and medications reviewed and updated.  Review of Systems  Constitutional: Positive for unexpected weight change (10 pounds in 6 months). Negative for fever and chills.  HENT: Negative for ear discharge and ear pain.   Eyes: Negative for discharge and visual disturbance.  Respiratory: Negative for shortness of breath and wheezing.   Cardiovascular: Negative for chest pain and leg swelling.  Gastrointestinal:  Negative for abdominal pain, diarrhea and constipation.  Endocrine: Negative for cold intolerance and heat intolerance.  Genitourinary: Negative for difficulty urinating.  Musculoskeletal: Negative for myalgias, back pain, joint swelling and gait problem.  Skin: Negative for color change and rash.  Neurological: Positive for headaches. Negative for dizziness, syncope, speech difficulty, weakness and light-headedness.  Psychiatric/Behavioral: Negative for dysphoric mood and agitation. The patient is not nervous/anxious.   All other systems reviewed and are negative.   Per HPI unless specifically indicated above     Medication List       This list is accurate as of: 12/03/14  1:03 PM.  Always use your most recent med list.               albuterol 108 (90 BASE) MCG/ACT inhaler  Commonly known as:  PROVENTIL HFA;VENTOLIN HFA  Inhale 2 puffs into the lungs every 6 (six) hours as needed for wheezing.     amLODipine 5 MG tablet  Commonly known as:  NORVASC  Take 1 tablet (5 mg total) by mouth daily. TAKE 1 TABLET DAILY     doxycycline 100 MG tablet  Commonly known as:  VIBRA-TABS  Take 1 tablet (100 mg total) by mouth 2 (two) times daily. 1 po bid     fexofenadine-pseudoephedrine 60-120 MG per tablet  Commonly known as:  ALLEGRA-D  Take one bid     Olopatadine HCl 0.2 % Soln  Commonly known as:  PATADAY  INSTILL 1 DROP  IN [facility] EYE TWICE DAILY     pantoprazole 40 MG tablet  Commonly known as:  PROTONIX  Take 1 tablet (40 mg total) by mouth daily.     simvastatin 20 MG tablet  Commonly known as:  ZOCOR  Take 1 tablet (20 mg total) by mouth at bedtime. TAKE ONE TABLET AT BEDTIME           Objective:    BP 138/78 mmHg  Pulse 60  Temp(Src) 97.1 F (36.2 C) (Oral)  Ht _0  (1.778 m)  Wt 183 lb 12.8 oz (83.371 kg)  BMI 26.37 kg/m2  Wt Readings from Last 3 Encounters:  12/03/14 183 lb 12.8 oz (83.371 kg)  07/29/14 187 lb (84.823 kg)  07/10/14 191 lb (86.637 kg)     Physical Exam  Constitutional: He is oriented to person, place, and time. He appears well-developed and well-nourished. No distress.  HENT:  Right Ear: Tympanic membrane, external ear and ear canal normal.  Left Ear: Tympanic membrane, external ear and ear canal normal.  Nose: Mucosal edema and rhinorrhea present. Right sinus exhibits frontal sinus tenderness. Right sinus exhibits no maxillary sinus tenderness. Left sinus exhibits frontal sinus tenderness. Left sinus exhibits no maxillary sinus tenderness.  Mouth/Throat: Uvula is midline and mucous membranes are normal. Posterior oropharyngeal edema present. No oropharyngeal exudate or posterior oropharyngeal erythema.  Eyes: Conjunctivae and EOM are normal. Pupils are equal, round, and reactive to light. Right eye exhibits no discharge. No scleral icterus.  Neck: Neck supple. No thyromegaly present.  Cardiovascular: Normal rate, regular rhythm, normal heart sounds and intact distal pulses.   No murmur heard. Pulmonary/Chest: Effort normal and breath sounds normal. No respiratory distress. He has no wheezes.  Abdominal: He exhibits no distension. There is no tenderness.  Musculoskeletal: Normal range of motion. He exhibits no edema.  Lymphadenopathy:    He has no cervical adenopathy.  Neurological: He is alert and oriented to person, place, and time. Coordination normal.  Skin: Skin is warm and dry. No rash noted. He is not diaphoretic.  No visible rash left from either tick bite  Psychiatric: He has a normal mood and affect. His behavior is normal.  Vitals reviewed.   Results for orders placed or performed in visit on 07/29/14  CMP14+EGFR  Result Value Ref Range   Glucose 82 65 - 99 mg/dL   BUN 12 8 - 27 mg/dL   Creatinine, Ser 0.76 0.76 - 1.27 mg/dL   GFR calc non Af Amer 89 >59 mL/min/1.73   GFR calc Af Amer 103 >59 mL/min/1.73   BUN/Creatinine Ratio 16 10 - 22   Sodium 144 134 - 144 mmol/L   Potassium 3.9 3.5 - 5.2 mmol/L    Chloride 105 97 - 108 mmol/L   CO2 24 18 - 29 mmol/L   Calcium 8.9 8.6 - 10.2 mg/dL   Total Protein 5.7 (L) 6.0 - 8.5 g/dL   Albumin 4.1 3.5 - 4.8 g/dL   Globulin, Total 1.6 1.5 - 4.5 g/dL   Albumin/Globulin Ratio 2.6 (H) 1.1 - 2.5   Bilirubin Total 0.6 0.0 - 1.2 mg/dL   Alkaline Phosphatase 57 39 - 117 IU/L   AST 15 0 - 40 IU/L   ALT 7 0 - 44 IU/L  Lipid panel  Result Value Ref Range   Cholesterol, Total 195 100 - 199 mg/dL   Triglycerides 59 0 - 149 mg/dL   HDL 80 >39 mg/dL   VLDL Cholesterol Cal 12 5 - 40 mg/dL  LDL Calculated 103 (H) 0 - 99 mg/dL   Chol/HDL Ratio 2.4 0.0 - 5.0 ratio units  PSA, total and free  Result Value Ref Range   PSA 2.4 0.0 - 4.0 ng/mL   PSA, Free 0.38 N/A ng/mL   PSA, Free Pct 15.8 %      Assessment & Plan:   Problem List Items Addressed This Visit      Respiratory   Sinusitis, acute    Patient has headaches in the frontal region and left temporal region. He has chronic sinus issues and allergy issues for which he takes Allegra-D sometimes. Currently he is not taking Allegra-D, recommend for him to start taking Allegra-D along with Flonase over-the-counter. We will also check a chest x-ray because of some congestion and throat and down into the chest with the sinus drainage.      Relevant Medications   doxycycline (VIBRA-TABS) 100 MG tablet   Other Relevant Orders   DG Chest 2 View (Completed)     Musculoskeletal and Integument   Tick bite - Primary    Patient has had a couple of recent tick bites most recently 2 days ago. We will test for Lyme disease even though patient has never had rashes such. Will give patient doxycycline and to lab testing for Lyme disease.      Relevant Medications   doxycycline (VIBRA-TABS) 100 MG tablet   Other Relevant Orders   Lyme Ab/Western Blot Reflex   DG Chest 2 View (Completed)     Other   New onset of headaches    Patient has new headaches worse on the left side of temple region, associated with  visual problems and that eye. Some concern for sinus pressure versus temporal arteritis. We will test a sedimentation rate. And treat for sinusitis.      Relevant Orders   Sedimentation rate   DG Chest 2 View (Completed)       Follow up plan: Return if symptoms worsen or fail to improve.  Caryl Pina, MD [facility] 12/03/2014, 1:03 PM

## 2014-12-03 NOTE — Assessment & Plan Note (Signed)
Patient has new headaches worse on the left side of temple region, associated with visual problems and that eye. Some concern for sinus pressure versus temporal arteritis. We will test a sedimentation rate. And treat for sinusitis.

## 2014-12-03 NOTE — Patient Instructions (Signed)
Tick Bite Information Ticks are insects that attach themselves to the skin and draw blood for food. There are various types of ticks. Common types include wood ticks and deer ticks. Most ticks live in shrubs and grassy areas. Ticks can climb onto your body when you make contact with leaves or grass where the tick is waiting. The most common places on the body for ticks to attach themselves are the scalp, neck, armpits, waist, and groin. Most tick bites are harmless, but sometimes ticks carry germs that cause diseases. These germs can be spread to a person during the tick's feeding process. The chance of a disease spreading through a tick bite depends on:   The type of tick.  Time of year.   How long the tick is attached.   Geographic location.  HOW CAN YOU PREVENT TICK BITES? Take these steps to help prevent tick bites when you are outdoors:  Wear protective clothing. Long sleeves and long pants are best.   Wear white clothes so you can see ticks more easily.  Tuck your pant legs into your socks.   If walking on a trail, stay in the middle of the trail to avoid brushing against bushes.  Avoid walking through areas with long grass.  Put insect repellent on all exposed skin and along boot tops, pant legs, and sleeve cuffs.   Check clothing, hair, and skin repeatedly and before going inside.   Brush off any ticks that are not attached.  Take a shower or bath as soon as possible after being outdoors.  WHAT IS THE PROPER WAY TO REMOVE A TICK? Ticks should be removed as soon as possible to help prevent diseases caused by tick bites. 1. If latex gloves are available, put them on before trying to remove a tick.  2. Using fine-point tweezers, grasp the tick as close to the skin as possible. You may also use curved forceps or a tick removal tool. Grasp the tick as close to its head as possible. Avoid grasping the tick on its body. 3. Pull gently with steady upward pressure until  the tick lets go. Do not twist the tick or jerk it suddenly. This may break off the tick's head or mouth parts. 4. Do not squeeze or crush the tick's body. This could force disease-carrying fluids from the tick into your body.  5. After the tick is removed, wash the bite area and your hands with soap and water or other disinfectant such as alcohol. 6. Apply a small amount of antiseptic cream or ointment to the bite site.  7. Wash and disinfect any instruments that were used.  Do not try to remove a tick by applying a hot match, petroleum jelly, or fingernail polish to the tick. These methods do not work and may increase the chances of disease being spread from the tick bite.  WHEN SHOULD YOU SEEK MEDICAL CARE? Contact your health care provider if you are unable to remove a tick from your skin or if a part of the tick breaks off and is stuck in the skin.  After a tick bite, you need to be aware of signs and symptoms that could be related to diseases spread by ticks. Contact your health care provider if you develop any of the following in the days or weeks after the tick bite:  Unexplained fever.  Rash. A circular rash that appears days or weeks after the tick bite may indicate the possibility of Lyme disease. The rash may resemble   a target with a bull's-eye and may occur at a different part of your body than the tick bite.  Redness and swelling in the area of the tick bite.   Tender, swollen lymph glands.   Diarrhea.   Weight loss.   Cough.   Fatigue.   Muscle, joint, or bone pain.   Abdominal pain.   Headache.   Lethargy or a change in your level of consciousness.  Difficulty walking or moving your legs.   Numbness in the legs.   Paralysis.  Shortness of breath.   Confusion.   Repeated vomiting.  Document Released: 03/18/2000 Document Revised: 01/09/2013 Document Reviewed: 08/29/2012 ExitCare Patient Information 2015 [employer]. This information is  not intended to replace advice given to you by your health care provider. Make sure you discuss any questions you have with your health care provider.  

## 2014-12-05 LAB — LYME AB/WESTERN BLOT REFLEX

## 2014-12-05 LAB — SEDIMENTATION RATE: Sed Rate: 2 mm/hr (ref 0–30)

## 2014-12-22 DIAGNOSIS — G4733 Obstructive sleep apnea (adult) (pediatric): Secondary | ICD-10-CM | POA: Diagnosis not present

## 2014-12-30 DIAGNOSIS — G4733 Obstructive sleep apnea (adult) (pediatric): Secondary | ICD-10-CM | POA: Diagnosis not present

## 2015-01-21 DIAGNOSIS — G4733 Obstructive sleep apnea (adult) (pediatric): Secondary | ICD-10-CM | POA: Diagnosis not present

## 2015-01-29 ENCOUNTER — Ambulatory Visit: Payer: Medicare Other | Admitting: Family Medicine

## 2015-02-21 DIAGNOSIS — G4733 Obstructive sleep apnea (adult) (pediatric): Secondary | ICD-10-CM | POA: Diagnosis not present

## 2015-03-09 DIAGNOSIS — I973 Postprocedural hypertension: Secondary | ICD-10-CM | POA: Diagnosis not present

## 2015-03-09 DIAGNOSIS — M9903 Segmental and somatic dysfunction of lumbar region: Secondary | ICD-10-CM | POA: Diagnosis not present

## 2015-03-09 DIAGNOSIS — M9902 Segmental and somatic dysfunction of thoracic region: Secondary | ICD-10-CM | POA: Diagnosis not present

## 2015-03-09 DIAGNOSIS — M9901 Segmental and somatic dysfunction of cervical region: Secondary | ICD-10-CM | POA: Diagnosis not present

## 2015-03-09 DIAGNOSIS — M5137 Other intervertebral disc degeneration, lumbosacral region: Secondary | ICD-10-CM | POA: Diagnosis not present

## 2015-03-11 ENCOUNTER — Encounter: Payer: Self-pay | Admitting: Pharmacist

## 2015-03-11 ENCOUNTER — Ambulatory Visit (INDEPENDENT_AMBULATORY_CARE_PROVIDER_SITE_OTHER): Payer: Medicare Other | Admitting: Pharmacist

## 2015-03-11 VITALS — BP 122/70 | HR 60 | Ht 69.25 in | Wt 190.0 lb

## 2015-03-11 DIAGNOSIS — Z0000 Encounter for general adult medical examination without abnormal findings: Secondary | ICD-10-CM | POA: Diagnosis not present

## 2015-03-11 DIAGNOSIS — Z23 Encounter for immunization: Secondary | ICD-10-CM | POA: Diagnosis not present

## 2015-03-11 NOTE — Progress Notes (Signed)
Patient ID: SHINJI MEEKER, male   DOB: 03-09-1941, 74 y.o.   MRN: [id number]    Subjective:   ANIKIN LEDIN is a 74 y.o. male who presents for a subsequent Medicare Annual Wellness Visit.  Mr. Schoff is present with his wife.  He is in a good mood and reports not current medical concerns. At last years AWV he had requested a sleep study and her reports today that he now uses CPAP nightly and that sleep has improved.    Review of Systems  Review of Systems  Constitutional: Negative.   HENT: Negative.   Eyes: Negative.   Respiratory: Negative.   Cardiovascular: Negative.   Gastrointestinal: Negative.   Genitourinary: Negative.   Musculoskeletal: Positive for back pain.  Skin: Negative.   Neurological: Negative.   Endo/Heme/Allergies: Negative.   Psychiatric/Behavioral: Negative.        Current Medications (verified) Outpatient Encounter Prescriptions as of 03/11/2015  Medication Sig  . albuterol (PROVENTIL HFA;VENTOLIN HFA) 108 (90 BASE) MCG/ACT inhaler Inhale 2 puffs into the lungs every 6 (six) hours as needed for wheezing.  Marland Kitchen amLODipine (NORVASC) 5 MG tablet Take 1 tablet (5 mg total) by mouth daily. TAKE 1 TABLET DAILY  . fexofenadine-pseudoephedrine (ALLEGRA-D) 60-120 MG per tablet Take one bid (Patient taking differently: as needed)  . montelukast (SINGULAIR) 10 MG tablet Take 10 mg by mouth at bedtime.  . Olopatadine HCl (PATADAY) 0.2 % SOLN INSTILL 1 DROP IN [facility] EYE TWICE DAILY (Patient taking differently: INSTILL 1 DROP IN EACH EYE PRN in [place])  . pantoprazole (PROTONIX) 40 MG tablet Take 1 tablet (40 mg total) by mouth daily.  . simvastatin (ZOCOR) 20 MG tablet Take 1 tablet (20 mg total) by mouth at bedtime. TAKE ONE TABLET AT BEDTIME (Patient taking differently: Take 20 mg by mouth at bedtime. Takes sometimes)  . [DISCONTINUED] doxycycline (VIBRA-TABS) 100 MG tablet Take 1 tablet (100 mg total) by mouth 2 (two) times daily. 1 po bid   No  facility-administered encounter medications on file as of 03/11/2015.    Allergies (verified) Review of patient's allergies indicates no known allergies.   History: Past Medical History  Diagnosis Date  . Asthma   . Hypertension   . Diverticulitis   . GERD (gastroesophageal reflux disease)   . Hyperlipidemia   . Cataract   . Cataract   . Allergy    Past Surgical History  Procedure Laterality Date  . Hernia repair    . Palate / uvula biopsy / excision    . Colonoscopy  06/09/2004    [id number] colon diverticulosis, otherwise normal colonoscopy  . Bilateral inguinal hernia    . Tonsillectomy    . Vasectomy    . Eye surgery  08/2014   Family History  Problem Relation Age of Onset  . Heart disease Mother   . Hip fracture Mother 13  . Osteoporosis Mother   . Dementia Mother 30  . Arthritis Father   . Heart disease Father   . Colon cancer Neg Hx   . Arthritis Sister   . Arthritis Brother    Social History   Occupational History  . retired     truck driving   Social History Main Topics  . Smoking status: Former Smoker -- 4.00 packs/day for 25 years    Types: Cigarettes    Quit date: 06/23/1982  . 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 Alcohol Use: 0.0 oz/week  0 Standard drinks or equivalent per week     Comment: brandy a few times a month, beer once a month  . Drug Use: No  . Sexual Activity: No    Do you feel safe at home?  Yes  Dietary issues and exercise activities discussed: Current Exercise Habits:: The patient has a physically strenous job, but has no regular exercise apart from work. (they do yardwork and are currently remodeling a hom)  Current Dietary habits:  Reports that his biggest challenge is limiting meat especially sausage.   Cardiac Risk Factors include: advanced age (>35men, >2 women);dyslipidemia;male gender;sedentary lifestyle  Objective:    Today's Vitals   03/11/15 1124  BP: 122/70  Pulse: 60    Height: 5' 9.25" (1.759 m)  Weight: 190 lb (86.183 kg)  PainSc: 1   PainLoc: Back   Body mass index is 27.85 kg/(m^2).   Activities of Daily Living No flowsheet data found.  Are there smokers in your home (other than you)? No    Depression Screen PHQ 2/9 Scores 03/11/2015 12/03/2014 07/29/2014 05/07/2014  PHQ - 2 Score 0 0 0 0    Fall Risk Fall Risk  03/11/2015 12/03/2014 07/29/2014 05/07/2014 03/14/2014  Falls in the past year? No No No No No    Cognitive Function: MMSE - Mini Mental State Exam 03/11/2015  Orientation to time 5  Orientation to Place 5  Registration 3  Attention/ Calculation 5  Recall 2  Language- name 2 objects 2  Language- repeat 1  Language- follow 3 step command 3  Language- read & follow direction 1  Write a sentence 1  Copy design 1  Total score 29    Immunizations and Health Maintenance Immunization History  Administered Date(s) Administered  . Influenza,inj,Quad PF,36+ Mos 01/14/2013, 03/14/2014, 03/11/2015  . Pneumococcal Conjugate-13 03/14/2014  . Pneumococcal Polysaccharide-23 01/14/2013  . Zoster 06/14/2011   Health Maintenance Due  Topic Date Due  . INFLUENZA VACCINE  11/03/2014    Patient Care Team: Worthy Rancher, MD as PCP - General (Family Medicine) Danie Binder, MD as Attending Physician (Gastroenterology) Ralene Muskrat as Physician Assistant (Chiropractic Medicine) Druscilla Brownie, MD as Consulting Physician (Dermatology)  Indicate any recent Medical Services you may have received from other than [facility] providers in the past year (date may be approximate).  ophthalmologist - Dr Leonia Corona at [place].     Assessment:    Annual Wellness Visit    Screening Tests Health Maintenance  Topic Date Due  . INFLUENZA VACCINE  11/03/2014  . TETANUS/TDAP  04/05/2015  . COLONOSCOPY  04/04/2017  . ZOSTAVAX  Completed  . PNA vac Low Risk Adult  Completed        Plan:   During the course of the  visit [place] was educated and counseled about the following appropriate screening and preventive services:   Vaccines are UTD except influenza which was given in office today.  Colorectal cancer screening - Colonscopy is UTD but needs FOBT however patient declines to take FOBT today.   Cardiovascular disease screening - BP is at goal and LDL is at goal with simvastatin 20mg  daily  Diabetes screening - UTD and was WNL  Bone Denisty / Osteoporosis Screening  Glaucoma screening / Eye Exam - UTD  Nutrition counseling - discussed increasing non starchy vegetables and fruits. Limiting processed and red meats.  Increase whole grains and lean proteins.   Prostate cancer screening - UTD  Advanced directives packet given and discussed today  Increase physical activity - patient and his wife have plans to join gym / [employer] program in 2017.   Patient Instructions (the written plan) were given to the patient.   Cherre Robins, [facility]   03/11/2015

## 2015-03-11 NOTE — Progress Notes (Signed)
   Subjective:    Patient ID: Patrick Bell, male    DOB: [birth year], 74 y.o.   MRN: [id number]  HPI    Review of Systems     Objective:   Physical Exam        Assessment & Plan:

## 2015-03-11 NOTE — Patient Instructions (Addendum)
Patrick Bell , Thank you for taking time to come for your Medicare Wellness Visit. I appreciate your ongoing commitment to your health goals. Please review the following plan we discussed and let me know if I can assist you in the future.   These are the goals we discussed:  Continue to stay active and as planned start [employer] program in 2017!  Eat lots of non-starchy vegetables - carrots, green bean, squash, zucchini, tomatoes, onions, peppers, spinach and other green leafy vegetables, cabbage, lettuce, cucumbers, asparagus, okra (not fried), eggplant limit sugar and processed foods (cakes, cookies, ice cream, crackers and chips) Increase fresh fruit but limit serving sizes 1/2 cup or about the size of tennis or baseball limit red meat to no more than 1-2 times per week (serving size about the size of your palm) Choose whole grains / lean proteins - whole wheat bread, quinoa, whole grain rice (1/2 cup), fish, chicken, [place]     This is a list of the screening recommended for you and due dates:  Health Maintenance  Topic Date Due  . Flu Shot  11/03/2015  . Tetanus Vaccine  04/05/2015  . Colon Cancer Screening  04/04/2017  . Shingles Vaccine  Completed  . Pneumonia vaccines  Completed    Health Maintenance, Male A healthy lifestyle and preventative care can promote health and wellness.  Maintain regular health, dental, and eye exams.  Eat a healthy diet. Foods like vegetables, fruits, whole grains, low-fat dairy products, and lean protein foods contain the nutrients you need and are low in calories. Decrease your intake of foods high in solid fats, added sugars, and salt. Get information about a proper diet from your health care provider, if necessary.  Regular physical exercise is one of the most important things you can do for your health. Most adults should get at least 150 minutes of moderate-intensity exercise (any activity that increases your heart rate and causes you to  sweat) each week. In addition, most adults need muscle-strengthening exercises on 2 or more days a week.   Maintain a healthy weight. The body mass index (BMI) is a screening tool to identify possible weight problems. It provides an estimate of body fat based on height and weight. Your health care provider can find your BMI and can help you achieve or maintain a healthy weight. For males 20 years and older:  A BMI below 18.5 is considered underweight.  A BMI of 18.5 to 24.9 is normal.  A BMI of 25 to 29.9 is considered overweight.  A BMI of 30 and above is considered obese.  Maintain normal blood lipids and cholesterol by exercising and minimizing your intake of saturated fat. Eat a balanced diet with plenty of fruits and vegetables. Blood tests for lipids and cholesterol should begin at age 15 and be repeated every 5 years. If your lipid or cholesterol levels are high, you are over age 18, or you are at high risk for heart disease, you may need your cholesterol levels checked more frequently.Ongoing high lipid and cholesterol levels should be treated with medicines if diet and exercise are not working.  If you smoke, find out from your health care provider how to quit. If you do not use tobacco, do not start.  Lung cancer screening is recommended for adults aged 44-80 years who are at high risk for developing lung cancer because of a history of smoking. A yearly low-dose CT scan of the lungs is recommended for people who have at  least a 30-pack-year history of smoking and are current smokers or have quit within the past 15 years. A pack year of smoking is smoking an average of 1 pack of cigarettes a day for 1 year (for example, a 30-pack-year history of smoking could mean smoking 1 pack a day for 30 years or 2 packs a day for 15 years). Yearly screening should continue until the smoker has stopped smoking for at least 15 years. Yearly screening should be stopped for people who develop a health  problem that would prevent them from having lung cancer treatment.  If you choose to drink alcohol, do not have more than 2 drinks per day. One drink is considered to be 12 oz (360 mL) of beer, 5 oz (150 mL) of wine, or 1.5 oz (45 mL) of liquor.  Avoid the use of street drugs. Do not share needles with anyone. Ask for help if you need support or instructions about stopping the use of drugs.  High blood pressure causes heart disease and increases the risk of stroke. High blood pressure is more likely to develop in:  People who have blood pressure in the end of the normal range (100-139/85-89 mm Hg).  People who are overweight or obese.  People who are African American.  If you are 43-60 years of age, have your blood pressure checked every 3-5 years. If you are 30 years of age or older, have your blood pressure checked every year. You should have your blood pressure measured twice--once when you are at a hospital or clinic, and once when you are not at a hospital or clinic. Record the average of the two measurements. To check your blood pressure when you are not at a hospital or clinic, you can use:  An automated blood pressure machine at a pharmacy.  A home blood pressure monitor.  If you are 101-2 years old, ask your health care provider if you should take aspirin to prevent heart disease.  Diabetes screening involves taking a blood sample to check your fasting blood sugar level. This should be done once every 3 years after age 1 if you are at a normal weight and without risk factors for diabetes. Testing should be considered at a younger age or be carried out more frequently if you are overweight and have at least 1 risk factor for diabetes.  Colorectal cancer can be detected and often prevented. Most routine colorectal cancer screening begins at the age of 65 and continues through age 10. However, your health care provider may recommend screening at an earlier age if you have risk factors  for colon cancer. On a yearly basis, your health care provider may provide home test kits to check for hidden blood in the stool. A small camera at the end of a tube may be used to directly examine the colon (sigmoidoscopy or colonoscopy) to detect the earliest forms of colorectal cancer. Talk to your health care provider about this at age 54 when routine screening begins. A direct exam of the colon should be repeated every 5-10 years through age 74, unless early forms of precancerous polyps or small growths are found.  People who are at an increased risk for hepatitis B should be screened for this virus. You are considered at high risk for hepatitis B if:  You were born in a country where hepatitis B occurs often. Talk with your health care provider about which countries are considered high risk.  Your parents were born in a high-risk  country and you have not received a shot to protect against hepatitis B (hepatitis B vaccine).  You have HIV or AIDS.  You use needles to inject street drugs.  You live with, or have sex with, someone who has hepatitis B.  You are a man who has sex with other men (MSM).  You get hemodialysis treatment.  You take certain medicines for conditions like cancer, organ transplantation, and autoimmune conditions.  Hepatitis C blood testing is recommended for all people born from [birth year] through 1966 and any individual with known risk factors for hepatitis C.  Healthy men should no longer receive prostate-specific antigen (PSA) blood tests as part of routine cancer screening. Talk to your health care provider about prostate cancer screening.  Testicular cancer screening is not recommended for adolescents or adult males who have no symptoms. Screening includes self-exam, a health care provider exam, and other screening tests. Consult with your health care provider about any symptoms you have or any concerns you have about testicular cancer.  Practice safe sex. Use  condoms and avoid high-risk sexual practices to reduce the spread of sexually transmitted infections (STIs).  You should be screened for STIs, including gonorrhea and chlamydia if:  You are sexually active and are younger than 24 years.  You are older than 24 years, and your health care provider tells you that you are at risk for this type of infection.  Your sexual activity has changed since you were last screened, and you are at an increased risk for chlamydia or gonorrhea. Ask your health care provider if you are at risk.  If you are at risk of being infected with HIV, it is recommended that you take a prescription medicine daily to prevent HIV infection. This is called pre-exposure prophylaxis (PrEP). You are considered at risk if:  You are a man who has sex with other men (MSM).  You are a heterosexual man who is sexually active with multiple partners.  You take drugs by injection.  You are sexually active with a partner who has HIV.  Talk with your health care provider about whether you are at high risk of being infected with HIV. If you choose to begin PrEP, you should first be tested for HIV. You should then be tested every 3 months for as long as you are taking PrEP.  Use sunscreen. Apply sunscreen liberally and repeatedly throughout the day. You should seek shade when your shadow is shorter than you. Protect yourself by wearing long sleeves, pants, a wide-brimmed hat, and sunglasses year round whenever you are outdoors.  Tell your health care provider of new moles or changes in moles, especially if there is a change in shape or color. Also, tell your health care provider if a mole is larger than the size of a pencil eraser.  A one-time screening for abdominal aortic aneurysm (AAA) and surgical repair of large AAAs by ultrasound is recommended for men aged 72-75 years who are current or former smokers.  Stay current with your vaccines (immunizations).   This information is not  intended to replace advice given to you by your health care provider. Make sure you discuss any questions you have with your health care provider.   Document Released: 09/17/2007 Document Revised: 04/11/2014 Document Reviewed: 08/16/2010 Elsevier Interactive Patient Education [employer].

## 2015-03-12 ENCOUNTER — Telehealth: Payer: Self-pay | Admitting: Family Medicine

## 2015-03-12 DIAGNOSIS — M9902 Segmental and somatic dysfunction of thoracic region: Secondary | ICD-10-CM | POA: Diagnosis not present

## 2015-03-12 DIAGNOSIS — I973 Postprocedural hypertension: Secondary | ICD-10-CM | POA: Diagnosis not present

## 2015-03-12 DIAGNOSIS — M5137 Other intervertebral disc degeneration, lumbosacral region: Secondary | ICD-10-CM | POA: Diagnosis not present

## 2015-03-12 DIAGNOSIS — M9901 Segmental and somatic dysfunction of cervical region: Secondary | ICD-10-CM | POA: Diagnosis not present

## 2015-03-12 DIAGNOSIS — M9903 Segmental and somatic dysfunction of lumbar region: Secondary | ICD-10-CM | POA: Diagnosis not present

## 2015-03-16 ENCOUNTER — Ambulatory Visit: Payer: Medicare Other | Admitting: Pharmacist

## 2015-03-20 ENCOUNTER — Other Ambulatory Visit: Payer: Self-pay | Admitting: Nurse Practitioner

## 2015-03-20 NOTE — Telephone Encounter (Signed)
denied °

## 2015-03-23 DIAGNOSIS — M9903 Segmental and somatic dysfunction of lumbar region: Secondary | ICD-10-CM | POA: Diagnosis not present

## 2015-03-23 DIAGNOSIS — G4733 Obstructive sleep apnea (adult) (pediatric): Secondary | ICD-10-CM | POA: Diagnosis not present

## 2015-03-23 DIAGNOSIS — I973 Postprocedural hypertension: Secondary | ICD-10-CM | POA: Diagnosis not present

## 2015-03-23 DIAGNOSIS — M5137 Other intervertebral disc degeneration, lumbosacral region: Secondary | ICD-10-CM | POA: Diagnosis not present

## 2015-03-23 DIAGNOSIS — M9901 Segmental and somatic dysfunction of cervical region: Secondary | ICD-10-CM | POA: Diagnosis not present

## 2015-03-23 DIAGNOSIS — M9902 Segmental and somatic dysfunction of thoracic region: Secondary | ICD-10-CM | POA: Diagnosis not present

## 2015-03-25 DIAGNOSIS — M9902 Segmental and somatic dysfunction of thoracic region: Secondary | ICD-10-CM | POA: Diagnosis not present

## 2015-03-25 DIAGNOSIS — M9901 Segmental and somatic dysfunction of cervical region: Secondary | ICD-10-CM | POA: Diagnosis not present

## 2015-03-25 DIAGNOSIS — I973 Postprocedural hypertension: Secondary | ICD-10-CM | POA: Diagnosis not present

## 2015-03-25 DIAGNOSIS — M5137 Other intervertebral disc degeneration, lumbosacral region: Secondary | ICD-10-CM | POA: Diagnosis not present

## 2015-03-25 DIAGNOSIS — M9903 Segmental and somatic dysfunction of lumbar region: Secondary | ICD-10-CM | POA: Diagnosis not present

## 2015-03-30 DIAGNOSIS — M9902 Segmental and somatic dysfunction of thoracic region: Secondary | ICD-10-CM | POA: Diagnosis not present

## 2015-03-30 DIAGNOSIS — I973 Postprocedural hypertension: Secondary | ICD-10-CM | POA: Diagnosis not present

## 2015-03-30 DIAGNOSIS — M5137 Other intervertebral disc degeneration, lumbosacral region: Secondary | ICD-10-CM | POA: Diagnosis not present

## 2015-03-30 DIAGNOSIS — M9903 Segmental and somatic dysfunction of lumbar region: Secondary | ICD-10-CM | POA: Diagnosis not present

## 2015-03-30 DIAGNOSIS — M9901 Segmental and somatic dysfunction of cervical region: Secondary | ICD-10-CM | POA: Diagnosis not present

## 2015-04-03 DIAGNOSIS — G4733 Obstructive sleep apnea (adult) (pediatric): Secondary | ICD-10-CM | POA: Diagnosis not present

## 2015-04-15 ENCOUNTER — Encounter: Payer: Self-pay | Admitting: Family Medicine

## 2015-04-15 ENCOUNTER — Ambulatory Visit (INDEPENDENT_AMBULATORY_CARE_PROVIDER_SITE_OTHER): Payer: Medicare Other

## 2015-04-15 ENCOUNTER — Ambulatory Visit (INDEPENDENT_AMBULATORY_CARE_PROVIDER_SITE_OTHER): Payer: Medicare Other | Admitting: Family Medicine

## 2015-04-15 VITALS — BP 119/67 | HR 79 | Temp 97.2°F | Ht 69.25 in | Wt 188.6 lb

## 2015-04-15 DIAGNOSIS — R109 Unspecified abdominal pain: Secondary | ICD-10-CM

## 2015-04-15 NOTE — Progress Notes (Signed)
   Subjective:    Patient ID: Patrick Bell, male    DOB: Dec 02, 1940, 75 y.o.   MRN: [id number]  HPI 75 year old man with a 4 day history of constipation. No known reason. He normally has daily rather loose stools. He has some clear liquid stool yesterday. He has lower abdominal pain but it's worse on the right than the left. His stomach has been making lots of noise. He has not had much of an appetite. Last colonoscopy was 8-9 years ago by history. He has had no vomiting of any type.  Patient Active Problem List   Diagnosis Date Noted  . Tick bite 12/03/2014  . Sinusitis, acute 12/03/2014  . New onset of headaches 12/03/2014  . BPH (benign prostatic hyperplasia) 07/29/2014  . Low serum vitamin D 03/14/2014  . Hyperlipidemia   . GERD (gastroesophageal reflux disease) 08/03/2012  . Essential hypertension, benign 08/19/2008  . DIVERTICULAR DISEASE 08/18/2008  . BACK PAIN, CHRONIC 08/18/2008  . OSA (obstructive sleep apnea) 08/18/2008  . PREMATURE VENTRICULAR CONTRACTIONS 07/23/2008  . ASTHMA 07/23/2008   Outpatient Encounter Prescriptions as of 04/15/2015  Medication Sig  . albuterol (PROVENTIL HFA;VENTOLIN HFA) 108 (90 BASE) MCG/ACT inhaler Inhale 2 puffs into the lungs every 6 (six) hours as needed for wheezing.  Marland Kitchen amLODipine (NORVASC) 5 MG tablet Take 1 tablet (5 mg total) by mouth daily. TAKE 1 TABLET DAILY  . fexofenadine-pseudoephedrine (ALLEGRA-D) 60-120 MG per tablet Take one bid (Patient taking differently: as needed)  . pantoprazole (PROTONIX) 40 MG tablet Take 1 tablet (40 mg total) by mouth daily.  . simvastatin (ZOCOR) 20 MG tablet Take 1 tablet (20 mg total) by mouth at bedtime. TAKE ONE TABLET AT BEDTIME (Patient taking differently: Take 20 mg by mouth at bedtime. Takes sometimes)  . montelukast (SINGULAIR) 10 MG tablet Take 10 mg by mouth at bedtime. Reported on 04/15/2015  . Olopatadine HCl (PATADAY) 0.2 % SOLN INSTILL 1 DROP IN [facility] EYE TWICE DAILY (Patient not taking:  Reported on 04/15/2015)  . pantoprazole (PROTONIX) 40 MG tablet Take 1 tablet (40 mg total) by mouth daily.   No facility-administered encounter medications on file as of 04/15/2015.      Review of Systems  Constitutional: Negative.   Respiratory: Negative.   Cardiovascular: Negative.   Gastrointestinal: Positive for constipation.       Objective:   Physical Exam  Constitutional: He appears well-developed and well-nourished.  Abdominal: Soft. Bowel sounds are normal. He exhibits no mass. There is no tenderness. There is no rebound and no guarding.  Genitourinary: Rectum normal.          Assessment & Plan:  1. Abdominal pain, unspecified abdominal location X-ray shows bowel gas pattern throughout the intestine. There are some mild dilations in the small intestine. Radiologist over read suggests possibility of diverticular disease. I have suggested trial of a fleets enema or Dulcolax suppository to see if that would get things moving. I called and spoke to his wife with that report of the x-ray. Second suggestion was if no results from simulation from lower end of: Would probably recommend abdominal CT to rule out diverticular abscess with small bowel obstruction  Wardell Honour MD - DG Abd 2 Views; Future

## 2015-04-23 DIAGNOSIS — G4733 Obstructive sleep apnea (adult) (pediatric): Secondary | ICD-10-CM | POA: Diagnosis not present

## 2015-05-24 DIAGNOSIS — G4733 Obstructive sleep apnea (adult) (pediatric): Secondary | ICD-10-CM | POA: Diagnosis not present

## 2015-06-05 ENCOUNTER — Other Ambulatory Visit: Payer: Self-pay | Admitting: Family Medicine

## 2015-06-05 ENCOUNTER — Other Ambulatory Visit: Payer: Self-pay | Admitting: Nurse Practitioner

## 2015-06-09 ENCOUNTER — Other Ambulatory Visit: Payer: Self-pay | Admitting: *Deleted

## 2015-06-09 MED ORDER — SIMVASTATIN 10 MG PO TABS: 10.0000 mg | ORAL_TABLET | Freq: Every day | ORAL | Status: DC

## 2015-06-21 DIAGNOSIS — G4733 Obstructive sleep apnea (adult) (pediatric): Secondary | ICD-10-CM | POA: Diagnosis not present

## 2015-07-01 DIAGNOSIS — G4733 Obstructive sleep apnea (adult) (pediatric): Secondary | ICD-10-CM | POA: Diagnosis not present

## 2015-07-03 ENCOUNTER — Other Ambulatory Visit: Payer: Self-pay | Admitting: *Deleted

## 2015-07-03 DIAGNOSIS — J4520 Mild intermittent asthma, uncomplicated: Secondary | ICD-10-CM

## 2015-07-03 MED ORDER — ALBUTEROL SULFATE HFA 108 (90 BASE) MCG/ACT IN AERS: 2.0000 | INHALATION_SPRAY | Freq: Four times a day (QID) | RESPIRATORY_TRACT | Status: DC | PRN

## 2015-07-20 DIAGNOSIS — I973 Postprocedural hypertension: Secondary | ICD-10-CM | POA: Diagnosis not present

## 2015-07-20 DIAGNOSIS — M9901 Segmental and somatic dysfunction of cervical region: Secondary | ICD-10-CM | POA: Diagnosis not present

## 2015-07-20 DIAGNOSIS — M9902 Segmental and somatic dysfunction of thoracic region: Secondary | ICD-10-CM | POA: Diagnosis not present

## 2015-07-20 DIAGNOSIS — M5137 Other intervertebral disc degeneration, lumbosacral region: Secondary | ICD-10-CM | POA: Diagnosis not present

## 2015-07-20 DIAGNOSIS — M9903 Segmental and somatic dysfunction of lumbar region: Secondary | ICD-10-CM | POA: Diagnosis not present

## 2015-07-21 DIAGNOSIS — M5137 Other intervertebral disc degeneration, lumbosacral region: Secondary | ICD-10-CM | POA: Diagnosis not present

## 2015-07-21 DIAGNOSIS — I973 Postprocedural hypertension: Secondary | ICD-10-CM | POA: Diagnosis not present

## 2015-07-21 DIAGNOSIS — M9903 Segmental and somatic dysfunction of lumbar region: Secondary | ICD-10-CM | POA: Diagnosis not present

## 2015-07-21 DIAGNOSIS — M9902 Segmental and somatic dysfunction of thoracic region: Secondary | ICD-10-CM | POA: Diagnosis not present

## 2015-07-21 DIAGNOSIS — M9901 Segmental and somatic dysfunction of cervical region: Secondary | ICD-10-CM | POA: Diagnosis not present

## 2015-07-22 DIAGNOSIS — G4733 Obstructive sleep apnea (adult) (pediatric): Secondary | ICD-10-CM | POA: Diagnosis not present

## 2015-07-23 DIAGNOSIS — M5137 Other intervertebral disc degeneration, lumbosacral region: Secondary | ICD-10-CM | POA: Diagnosis not present

## 2015-07-23 DIAGNOSIS — M9901 Segmental and somatic dysfunction of cervical region: Secondary | ICD-10-CM | POA: Diagnosis not present

## 2015-07-23 DIAGNOSIS — I973 Postprocedural hypertension: Secondary | ICD-10-CM | POA: Diagnosis not present

## 2015-07-23 DIAGNOSIS — M9902 Segmental and somatic dysfunction of thoracic region: Secondary | ICD-10-CM | POA: Diagnosis not present

## 2015-07-23 DIAGNOSIS — M9903 Segmental and somatic dysfunction of lumbar region: Secondary | ICD-10-CM | POA: Diagnosis not present

## 2015-07-27 DIAGNOSIS — M5137 Other intervertebral disc degeneration, lumbosacral region: Secondary | ICD-10-CM | POA: Diagnosis not present

## 2015-07-27 DIAGNOSIS — M9901 Segmental and somatic dysfunction of cervical region: Secondary | ICD-10-CM | POA: Diagnosis not present

## 2015-07-27 DIAGNOSIS — M9903 Segmental and somatic dysfunction of lumbar region: Secondary | ICD-10-CM | POA: Diagnosis not present

## 2015-07-27 DIAGNOSIS — I973 Postprocedural hypertension: Secondary | ICD-10-CM | POA: Diagnosis not present

## 2015-07-27 DIAGNOSIS — M9902 Segmental and somatic dysfunction of thoracic region: Secondary | ICD-10-CM | POA: Diagnosis not present

## 2015-08-11 DIAGNOSIS — M9901 Segmental and somatic dysfunction of cervical region: Secondary | ICD-10-CM | POA: Diagnosis not present

## 2015-08-11 DIAGNOSIS — M9902 Segmental and somatic dysfunction of thoracic region: Secondary | ICD-10-CM | POA: Diagnosis not present

## 2015-08-11 DIAGNOSIS — M9903 Segmental and somatic dysfunction of lumbar region: Secondary | ICD-10-CM | POA: Diagnosis not present

## 2015-08-11 DIAGNOSIS — I973 Postprocedural hypertension: Secondary | ICD-10-CM | POA: Diagnosis not present

## 2015-08-11 DIAGNOSIS — M5137 Other intervertebral disc degeneration, lumbosacral region: Secondary | ICD-10-CM | POA: Diagnosis not present

## 2015-08-12 DIAGNOSIS — M9902 Segmental and somatic dysfunction of thoracic region: Secondary | ICD-10-CM | POA: Diagnosis not present

## 2015-08-12 DIAGNOSIS — M9903 Segmental and somatic dysfunction of lumbar region: Secondary | ICD-10-CM | POA: Diagnosis not present

## 2015-08-12 DIAGNOSIS — I973 Postprocedural hypertension: Secondary | ICD-10-CM | POA: Diagnosis not present

## 2015-08-12 DIAGNOSIS — M5137 Other intervertebral disc degeneration, lumbosacral region: Secondary | ICD-10-CM | POA: Diagnosis not present

## 2015-08-12 DIAGNOSIS — M9901 Segmental and somatic dysfunction of cervical region: Secondary | ICD-10-CM | POA: Diagnosis not present

## 2015-08-13 DIAGNOSIS — M9901 Segmental and somatic dysfunction of cervical region: Secondary | ICD-10-CM | POA: Diagnosis not present

## 2015-08-13 DIAGNOSIS — M5137 Other intervertebral disc degeneration, lumbosacral region: Secondary | ICD-10-CM | POA: Diagnosis not present

## 2015-08-13 DIAGNOSIS — M9902 Segmental and somatic dysfunction of thoracic region: Secondary | ICD-10-CM | POA: Diagnosis not present

## 2015-08-13 DIAGNOSIS — M9903 Segmental and somatic dysfunction of lumbar region: Secondary | ICD-10-CM | POA: Diagnosis not present

## 2015-08-13 DIAGNOSIS — I973 Postprocedural hypertension: Secondary | ICD-10-CM | POA: Diagnosis not present

## 2015-08-17 DIAGNOSIS — M5137 Other intervertebral disc degeneration, lumbosacral region: Secondary | ICD-10-CM | POA: Diagnosis not present

## 2015-08-17 DIAGNOSIS — M9903 Segmental and somatic dysfunction of lumbar region: Secondary | ICD-10-CM | POA: Diagnosis not present

## 2015-08-17 DIAGNOSIS — M9902 Segmental and somatic dysfunction of thoracic region: Secondary | ICD-10-CM | POA: Diagnosis not present

## 2015-08-17 DIAGNOSIS — M9901 Segmental and somatic dysfunction of cervical region: Secondary | ICD-10-CM | POA: Diagnosis not present

## 2015-08-17 DIAGNOSIS — I973 Postprocedural hypertension: Secondary | ICD-10-CM | POA: Diagnosis not present

## 2015-08-18 ENCOUNTER — Other Ambulatory Visit: Payer: Self-pay | Admitting: Nurse Practitioner

## 2015-08-19 ENCOUNTER — Other Ambulatory Visit: Payer: Self-pay

## 2015-08-19 MED ORDER — OLOPATADINE HCL 0.2 % OP SOLN: OPHTHALMIC | Status: DC

## 2015-08-24 DIAGNOSIS — M9903 Segmental and somatic dysfunction of lumbar region: Secondary | ICD-10-CM | POA: Diagnosis not present

## 2015-08-24 DIAGNOSIS — M9901 Segmental and somatic dysfunction of cervical region: Secondary | ICD-10-CM | POA: Diagnosis not present

## 2015-08-24 DIAGNOSIS — M5137 Other intervertebral disc degeneration, lumbosacral region: Secondary | ICD-10-CM | POA: Diagnosis not present

## 2015-08-24 DIAGNOSIS — M9902 Segmental and somatic dysfunction of thoracic region: Secondary | ICD-10-CM | POA: Diagnosis not present

## 2015-08-24 DIAGNOSIS — I973 Postprocedural hypertension: Secondary | ICD-10-CM | POA: Diagnosis not present

## 2015-08-27 DIAGNOSIS — M9903 Segmental and somatic dysfunction of lumbar region: Secondary | ICD-10-CM | POA: Diagnosis not present

## 2015-08-27 DIAGNOSIS — M9902 Segmental and somatic dysfunction of thoracic region: Secondary | ICD-10-CM | POA: Diagnosis not present

## 2015-08-27 DIAGNOSIS — I973 Postprocedural hypertension: Secondary | ICD-10-CM | POA: Diagnosis not present

## 2015-08-27 DIAGNOSIS — M5137 Other intervertebral disc degeneration, lumbosacral region: Secondary | ICD-10-CM | POA: Diagnosis not present

## 2015-08-27 DIAGNOSIS — M9901 Segmental and somatic dysfunction of cervical region: Secondary | ICD-10-CM | POA: Diagnosis not present

## 2015-09-03 DIAGNOSIS — I973 Postprocedural hypertension: Secondary | ICD-10-CM | POA: Diagnosis not present

## 2015-09-03 DIAGNOSIS — M9902 Segmental and somatic dysfunction of thoracic region: Secondary | ICD-10-CM | POA: Diagnosis not present

## 2015-09-03 DIAGNOSIS — M9901 Segmental and somatic dysfunction of cervical region: Secondary | ICD-10-CM | POA: Diagnosis not present

## 2015-09-03 DIAGNOSIS — M9903 Segmental and somatic dysfunction of lumbar region: Secondary | ICD-10-CM | POA: Diagnosis not present

## 2015-09-03 DIAGNOSIS — M5137 Other intervertebral disc degeneration, lumbosacral region: Secondary | ICD-10-CM | POA: Diagnosis not present

## 2015-09-07 DIAGNOSIS — M5137 Other intervertebral disc degeneration, lumbosacral region: Secondary | ICD-10-CM | POA: Diagnosis not present

## 2015-09-07 DIAGNOSIS — M9901 Segmental and somatic dysfunction of cervical region: Secondary | ICD-10-CM | POA: Diagnosis not present

## 2015-09-07 DIAGNOSIS — M9902 Segmental and somatic dysfunction of thoracic region: Secondary | ICD-10-CM | POA: Diagnosis not present

## 2015-09-07 DIAGNOSIS — I973 Postprocedural hypertension: Secondary | ICD-10-CM | POA: Diagnosis not present

## 2015-09-07 DIAGNOSIS — M9903 Segmental and somatic dysfunction of lumbar region: Secondary | ICD-10-CM | POA: Diagnosis not present

## 2015-10-02 DIAGNOSIS — G4733 Obstructive sleep apnea (adult) (pediatric): Secondary | ICD-10-CM | POA: Diagnosis not present

## 2015-10-22 ENCOUNTER — Other Ambulatory Visit: Payer: Self-pay | Admitting: Family Medicine

## 2015-10-28 DIAGNOSIS — M9902 Segmental and somatic dysfunction of thoracic region: Secondary | ICD-10-CM | POA: Diagnosis not present

## 2015-10-28 DIAGNOSIS — I973 Postprocedural hypertension: Secondary | ICD-10-CM | POA: Diagnosis not present

## 2015-10-28 DIAGNOSIS — M9903 Segmental and somatic dysfunction of lumbar region: Secondary | ICD-10-CM | POA: Diagnosis not present

## 2015-10-28 DIAGNOSIS — M5137 Other intervertebral disc degeneration, lumbosacral region: Secondary | ICD-10-CM | POA: Diagnosis not present

## 2015-10-28 DIAGNOSIS — M9901 Segmental and somatic dysfunction of cervical region: Secondary | ICD-10-CM | POA: Diagnosis not present

## 2015-10-30 ENCOUNTER — Other Ambulatory Visit: Payer: Self-pay | Admitting: Family Medicine

## 2015-11-30 DIAGNOSIS — M5137 Other intervertebral disc degeneration, lumbosacral region: Secondary | ICD-10-CM | POA: Diagnosis not present

## 2015-11-30 DIAGNOSIS — M9903 Segmental and somatic dysfunction of lumbar region: Secondary | ICD-10-CM | POA: Diagnosis not present

## 2015-11-30 DIAGNOSIS — M9901 Segmental and somatic dysfunction of cervical region: Secondary | ICD-10-CM | POA: Diagnosis not present

## 2015-11-30 DIAGNOSIS — I973 Postprocedural hypertension: Secondary | ICD-10-CM | POA: Diagnosis not present

## 2015-11-30 DIAGNOSIS — M9902 Segmental and somatic dysfunction of thoracic region: Secondary | ICD-10-CM | POA: Diagnosis not present

## 2015-12-02 DIAGNOSIS — M9902 Segmental and somatic dysfunction of thoracic region: Secondary | ICD-10-CM | POA: Diagnosis not present

## 2015-12-02 DIAGNOSIS — I973 Postprocedural hypertension: Secondary | ICD-10-CM | POA: Diagnosis not present

## 2015-12-02 DIAGNOSIS — M9903 Segmental and somatic dysfunction of lumbar region: Secondary | ICD-10-CM | POA: Diagnosis not present

## 2015-12-02 DIAGNOSIS — M9901 Segmental and somatic dysfunction of cervical region: Secondary | ICD-10-CM | POA: Diagnosis not present

## 2015-12-02 DIAGNOSIS — M5137 Other intervertebral disc degeneration, lumbosacral region: Secondary | ICD-10-CM | POA: Diagnosis not present

## 2015-12-03 DIAGNOSIS — I973 Postprocedural hypertension: Secondary | ICD-10-CM | POA: Diagnosis not present

## 2015-12-03 DIAGNOSIS — M9901 Segmental and somatic dysfunction of cervical region: Secondary | ICD-10-CM | POA: Diagnosis not present

## 2015-12-03 DIAGNOSIS — M9903 Segmental and somatic dysfunction of lumbar region: Secondary | ICD-10-CM | POA: Diagnosis not present

## 2015-12-03 DIAGNOSIS — M9902 Segmental and somatic dysfunction of thoracic region: Secondary | ICD-10-CM | POA: Diagnosis not present

## 2015-12-03 DIAGNOSIS — M5137 Other intervertebral disc degeneration, lumbosacral region: Secondary | ICD-10-CM | POA: Diagnosis not present

## 2015-12-08 DIAGNOSIS — M5137 Other intervertebral disc degeneration, lumbosacral region: Secondary | ICD-10-CM | POA: Diagnosis not present

## 2015-12-08 DIAGNOSIS — M9903 Segmental and somatic dysfunction of lumbar region: Secondary | ICD-10-CM | POA: Diagnosis not present

## 2015-12-08 DIAGNOSIS — M9902 Segmental and somatic dysfunction of thoracic region: Secondary | ICD-10-CM | POA: Diagnosis not present

## 2015-12-08 DIAGNOSIS — M9901 Segmental and somatic dysfunction of cervical region: Secondary | ICD-10-CM | POA: Diagnosis not present

## 2015-12-08 DIAGNOSIS — I973 Postprocedural hypertension: Secondary | ICD-10-CM | POA: Diagnosis not present

## 2015-12-10 DIAGNOSIS — M5137 Other intervertebral disc degeneration, lumbosacral region: Secondary | ICD-10-CM | POA: Diagnosis not present

## 2015-12-10 DIAGNOSIS — M9903 Segmental and somatic dysfunction of lumbar region: Secondary | ICD-10-CM | POA: Diagnosis not present

## 2015-12-10 DIAGNOSIS — I973 Postprocedural hypertension: Secondary | ICD-10-CM | POA: Diagnosis not present

## 2015-12-10 DIAGNOSIS — M9901 Segmental and somatic dysfunction of cervical region: Secondary | ICD-10-CM | POA: Diagnosis not present

## 2015-12-10 DIAGNOSIS — M9902 Segmental and somatic dysfunction of thoracic region: Secondary | ICD-10-CM | POA: Diagnosis not present

## 2015-12-14 DIAGNOSIS — M9903 Segmental and somatic dysfunction of lumbar region: Secondary | ICD-10-CM | POA: Diagnosis not present

## 2015-12-14 DIAGNOSIS — M9901 Segmental and somatic dysfunction of cervical region: Secondary | ICD-10-CM | POA: Diagnosis not present

## 2015-12-14 DIAGNOSIS — M9902 Segmental and somatic dysfunction of thoracic region: Secondary | ICD-10-CM | POA: Diagnosis not present

## 2015-12-14 DIAGNOSIS — I973 Postprocedural hypertension: Secondary | ICD-10-CM | POA: Diagnosis not present

## 2015-12-14 DIAGNOSIS — M5137 Other intervertebral disc degeneration, lumbosacral region: Secondary | ICD-10-CM | POA: Diagnosis not present

## 2015-12-17 DIAGNOSIS — M9901 Segmental and somatic dysfunction of cervical region: Secondary | ICD-10-CM | POA: Diagnosis not present

## 2015-12-17 DIAGNOSIS — M9902 Segmental and somatic dysfunction of thoracic region: Secondary | ICD-10-CM | POA: Diagnosis not present

## 2015-12-17 DIAGNOSIS — M9903 Segmental and somatic dysfunction of lumbar region: Secondary | ICD-10-CM | POA: Diagnosis not present

## 2015-12-17 DIAGNOSIS — M5137 Other intervertebral disc degeneration, lumbosacral region: Secondary | ICD-10-CM | POA: Diagnosis not present

## 2015-12-17 DIAGNOSIS — I973 Postprocedural hypertension: Secondary | ICD-10-CM | POA: Diagnosis not present

## 2015-12-24 DIAGNOSIS — M9902 Segmental and somatic dysfunction of thoracic region: Secondary | ICD-10-CM | POA: Diagnosis not present

## 2015-12-24 DIAGNOSIS — I973 Postprocedural hypertension: Secondary | ICD-10-CM | POA: Diagnosis not present

## 2015-12-24 DIAGNOSIS — M9901 Segmental and somatic dysfunction of cervical region: Secondary | ICD-10-CM | POA: Diagnosis not present

## 2015-12-24 DIAGNOSIS — M9903 Segmental and somatic dysfunction of lumbar region: Secondary | ICD-10-CM | POA: Diagnosis not present

## 2015-12-24 DIAGNOSIS — M5137 Other intervertebral disc degeneration, lumbosacral region: Secondary | ICD-10-CM | POA: Diagnosis not present

## 2015-12-28 DIAGNOSIS — I973 Postprocedural hypertension: Secondary | ICD-10-CM | POA: Diagnosis not present

## 2015-12-28 DIAGNOSIS — M9902 Segmental and somatic dysfunction of thoracic region: Secondary | ICD-10-CM | POA: Diagnosis not present

## 2015-12-28 DIAGNOSIS — M9903 Segmental and somatic dysfunction of lumbar region: Secondary | ICD-10-CM | POA: Diagnosis not present

## 2015-12-28 DIAGNOSIS — M9901 Segmental and somatic dysfunction of cervical region: Secondary | ICD-10-CM | POA: Diagnosis not present

## 2015-12-28 DIAGNOSIS — M5137 Other intervertebral disc degeneration, lumbosacral region: Secondary | ICD-10-CM | POA: Diagnosis not present

## 2015-12-30 DIAGNOSIS — I973 Postprocedural hypertension: Secondary | ICD-10-CM | POA: Diagnosis not present

## 2015-12-30 DIAGNOSIS — M9902 Segmental and somatic dysfunction of thoracic region: Secondary | ICD-10-CM | POA: Diagnosis not present

## 2015-12-30 DIAGNOSIS — M5137 Other intervertebral disc degeneration, lumbosacral region: Secondary | ICD-10-CM | POA: Diagnosis not present

## 2015-12-30 DIAGNOSIS — M9903 Segmental and somatic dysfunction of lumbar region: Secondary | ICD-10-CM | POA: Diagnosis not present

## 2015-12-30 DIAGNOSIS — M9901 Segmental and somatic dysfunction of cervical region: Secondary | ICD-10-CM | POA: Diagnosis not present

## 2015-12-31 DIAGNOSIS — I973 Postprocedural hypertension: Secondary | ICD-10-CM | POA: Diagnosis not present

## 2015-12-31 DIAGNOSIS — M9902 Segmental and somatic dysfunction of thoracic region: Secondary | ICD-10-CM | POA: Diagnosis not present

## 2015-12-31 DIAGNOSIS — M9903 Segmental and somatic dysfunction of lumbar region: Secondary | ICD-10-CM | POA: Diagnosis not present

## 2015-12-31 DIAGNOSIS — M5137 Other intervertebral disc degeneration, lumbosacral region: Secondary | ICD-10-CM | POA: Diagnosis not present

## 2015-12-31 DIAGNOSIS — M9901 Segmental and somatic dysfunction of cervical region: Secondary | ICD-10-CM | POA: Diagnosis not present

## 2016-01-13 ENCOUNTER — Ambulatory Visit: Payer: Self-pay | Admitting: Pharmacist

## 2016-01-13 ENCOUNTER — Ambulatory Visit: Payer: Medicare Other

## 2016-01-19 ENCOUNTER — Ambulatory Visit (INDEPENDENT_AMBULATORY_CARE_PROVIDER_SITE_OTHER): Payer: Medicare Other | Admitting: Pharmacist

## 2016-01-19 ENCOUNTER — Encounter: Payer: Self-pay | Admitting: Pharmacist

## 2016-01-19 VITALS — BP 130/80 | HR 72 | Ht 69.0 in | Wt 191.5 lb

## 2016-01-19 DIAGNOSIS — Z0000 Encounter for general adult medical examination without abnormal findings: Secondary | ICD-10-CM

## 2016-01-19 DIAGNOSIS — Z23 Encounter for immunization: Secondary | ICD-10-CM

## 2016-01-19 DIAGNOSIS — I10 Essential (primary) hypertension: Secondary | ICD-10-CM

## 2016-01-19 DIAGNOSIS — E7800 Pure hypercholesterolemia, unspecified: Secondary | ICD-10-CM

## 2016-01-19 MED ORDER — SIMVASTATIN 10 MG PO TABS: 10.0000 mg | ORAL_TABLET | Freq: Every day | ORAL | 1 refills | Status: DC

## 2016-01-19 NOTE — Progress Notes (Signed)
Patient ID: Patrick Bell, male   DOB: [birth year], 75 y.o.   MRN: [id number]    Subjective:   Patrick Bell is a 75 y.o. male who presents for a subsequent Medicare Annual Wellness Visit.  [name] is a married WM. He has moved within the last 3 months to [place], [place].  His wife is present with him today. He is retired Administrator. He reports that he feels good, sleeps well at night.  He mentions that about 3 times a year he will "get a hitch or inflammation" in my left knee when I over do it" but this lasts at the most 2-3 days.   Since his move he is exercising less because he moved from a [place] farm to a home on 4 acres.      Review of Systems  Review of Systems  Constitutional: Negative.   HENT: Negative.   Eyes: Negative.   Respiratory: Negative.   Cardiovascular: Negative.   Gastrointestinal: Negative.   Genitourinary: Negative.   Musculoskeletal: Negative for back pain.  Skin: Negative.   Neurological: Negative.   Endo/Heme/Allergies: Negative.   Psychiatric/Behavioral: Negative.        Current Medications (verified) Outpatient Encounter Prescriptions as of 01/19/2016  Medication Sig  . albuterol (PROVENTIL HFA;VENTOLIN HFA) 108 (90 Base) MCG/ACT inhaler Inhale 2 puffs into the lungs every 6 (six) hours as needed for wheezing.  . montelukast (SINGULAIR) 10 MG tablet TAKE 1 TABLET DAILY  . pantoprazole (PROTONIX) 40 MG tablet Take 1 tablet (40 mg total) by mouth daily.  . [DISCONTINUED] montelukast (SINGULAIR) 10 MG tablet Take 10 mg by mouth at bedtime. Reported on 04/15/2015  . fexofenadine-pseudoephedrine (ALLEGRA-D) 60-120 MG 12 hr tablet Take 1 po bid (Patient taking differently: Take 1 po bid as needed)  . Olopatadine HCl (PATADAY) 0.2 % SOLN INSTILL 1 DROP IN [facility] EYE TWICE DAILY (Patient not taking: Reported on 01/19/2016)  . simvastatin (ZOCOR) 10 MG tablet Take 1 tablet (10 mg total) by mouth at bedtime.  . [DISCONTINUED] amLODipine (NORVASC) 5 MG tablet  TAKE 1 TABLET DAILY (Patient not taking: Reported on 01/19/2016)  . [DISCONTINUED] Olopatadine HCl (PATADAY) 0.2 % SOLN INSTILL 1 DROP INTO EACH EYE TWICE DAILY  . [DISCONTINUED] pantoprazole (PROTONIX) 40 MG tablet TAKE 1 TABLET DAILY  . [DISCONTINUED] simvastatin (ZOCOR) 10 MG tablet Take 1 tablet (10 mg total) by mouth at bedtime. (Patient not taking: Reported on 01/19/2016)   No facility-administered encounter medications on file as of 01/19/2016.    **patient states he has not taken amlodipine in over 2 months.   Allergies (verified) Review of patient's allergies indicates no known allergies.   History: Past Medical History:  Diagnosis Date  . Allergy   . Asthma   . Cataract   . Cataract   . Diverticulitis   . GERD (gastroesophageal reflux disease)   . Hyperlipidemia   . Hypertension    Past Surgical History:  Procedure Laterality Date  . bilateral inguinal hernia    . COLONOSCOPY  06/09/2004   AJO:INOMVEH colon diverticulosis, otherwise normal colonoscopy  . EYE SURGERY  08/2014  . HERNIA REPAIR    . PALATE / UVULA BIOPSY / EXCISION    . TONSILLECTOMY    . VASECTOMY     Family History  Problem Relation Age of Onset  . Heart disease Mother   . Hip fracture Mother 35  . Osteoporosis Mother   . Dementia Mother 63  . Arthritis Father   .  Heart disease Father   . Arthritis Sister   . Arthritis Brother   . Colon cancer Neg Hx    Social History   Occupational History  . retired Holiday representative    truck driving   Social History Main Topics  . Smoking status: Former Smoker    Packs/day: 4.00    Years: 25.00    Types: Cigarettes    Quit date: 06/23/1982  . 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 Alcohol use 0.0 oz/week     Comment: brandy a few times a month, beer once a month  . Drug use: No  . Sexual activity: Yes    Do you feel safe at home?  Yes  Dietary issues and exercise activities discussed:    Current Dietary  habits:  Admits that he is not following a low fat diet but he is interested in trying to keep LDL and BP better controlled with diet.      Objective:    Today's Vitals   01/19/16 1413  BP: 130/80  Pulse: 72  Weight: 191 lb 8 oz (86.9 kg)  Height: _0  (1.753 m)  PainSc: 0-No pain   Body mass index is 28.28 kg/m.   Activities of Daily Living In your present state of health, do you have any difficulty performing the following activities: 01/19/2016  Hearing? N  Vision? N  Difficulty concentrating or making decisions? N  Walking or climbing stairs? N  Dressing or bathing? N  Doing errands, shopping? N  Preparing Food and eating ? N  Using the Toilet? N  In the past six months, have you accidently leaked urine? N  Do you have problems with loss of bowel control? N  Managing your Medications? N  Managing your Finances? N  Housekeeping or managing your Housekeeping? N  Some recent data might be hidden    Are there smokers in your home (other than you)? No    Depression Screen PHQ 2/9 Scores 01/19/2016 03/11/2015 12/03/2014 07/29/2014  PHQ - 2 Score 0 0 0 0    Fall Risk Fall Risk  01/19/2016 03/11/2015 12/03/2014 07/29/2014 05/07/2014  Falls in the past year? _1     Cognitive Function: MMSE - Mini Mental State Exam 01/19/2016 03/11/2015  Orientation to time 5 5  Orientation to Place 5 5  Registration 3 3  Attention/ Calculation 5 5  Recall 3 2  Language- name 2 objects 2 2  Language- repeat 1 1  Language- follow 3 step command 3 3  Language- read & follow direction 1 1  Write a sentence 1 1  Copy design 1 1  Total score 30 29    Immunizations and Health Maintenance Immunization History  Administered Date(s) Administered  . Influenza,inj,Quad PF,36+ Mos 01/14/2013, 03/14/2014, 03/11/2015, 01/19/2016  . Pneumococcal Conjugate-13 03/14/2014  . Pneumococcal Polysaccharide-23 01/14/2013  . Zoster 06/14/2011   Health Maintenance Due  Topic Date Due  .  TETANUS/TDAP  04/05/2015  . INFLUENZA VACCINE  11/03/2015    Patient Care Team: Wardell Honour, MD as PCP - General (Family Medicine) Danie Binder, MD as Attending Physician (Gastroenterology) Ralene Muskrat as Physician Assistant (Chiropractic Medicine) Druscilla Brownie, MD as Consulting Physician (Dermatology)  Indicate any recent Medical Services you may have received from other than [facility] providers in the past year (date may be approximate).  ophthalmologist - Dr Leonia Corona at [place].  Assessment:    Annual Wellness Visit  HTN - controlled despite no taking amlodipine for last 2 months.  Hyperlipidemia - need to recheck lipids, patient has not been taking statin recently   Screening Tests Health Maintenance  Topic Date Due  . TETANUS/TDAP  04/05/2015  . INFLUENZA VACCINE  11/03/2015  . ZOSTAVAX  Completed  . PNA vac Low Risk Adult  Completed        Plan:   During the course of the visit Eilam was educated and counseled about the following appropriate screening and preventive services:   Vaccines are UTD except influenza which was given in office today and Tdap which patient declined due to cost ($45)  Colorectal cancer screening - Colonscopy is UTD  BP is at goal even without amlodipine - recommend remain off amlodipine  LDL previously at goal with simvastatin.  Patient has no fasted today but will RTC for have lipids checked and will then discuss if need to restart simvastatin  Diabetes screening - will check BMET with next labs  Glaucoma screening / Eye Exam - UTD  Nutrition counseling - discussed DASH diet - increase fruit and vegetables, whole grains and limit sodium intake  Advanced directives packet given and discussed again today  Increase physical activity - discussed increasing exercise - goal is 150 min per week  Orders Placed This Encounter  Procedures  . Flu Vaccine QUAD 36+ mos IM  . CMP14+EGFR    Standing  Status:   Future    Standing Expiration Date:   04/20/2016  . Lipid panel    Standing Status:   Future    Standing Expiration Date:   04/20/2016     Patient ID: [name], male   DOB: 05/11/1940, 17 y.o.   MRN: [id number]

## 2016-01-19 NOTE — Patient Instructions (Addendum)
  Patrick Bell , Thank you for taking time to come for your Medicare Wellness Visit. I appreciate your ongoing commitment to your health goals. Please review the following plan we discussed and let me know if I can assist you in the future.   These are the goals we discussed:  Increase physical activity - goal is 150 minutes each week.   Increase non-starchy vegetables - carrots, green bean, squash, zucchini, tomatoes, onions, peppers, spinach and other green leafy vegetables, cabbage, lettuce, cucumbers, asparagus, okra (not fried), eggplant Limit sugar and processed foods (cakes, cookies, ice cream, crackers and chips) Increase fresh fruit but limit serving sizes 1/2 cup or about the size of tennis or baseball Limit red meat to no more than 1-2 times per week (serving size about the size of your palm) Choose whole grains / lean proteins - whole wheat bread, quinoa, whole grain rice (1/2 cup), fish, chicken, [place] Avoid sugar and calorie containing beverages - soda, sweet tea and juice.  Choose water or unsweetened tea instead.  This is a list of the screening recommended for you and due dates:  Health Maintenance  Topic Date Due  . Tetanus Vaccine  04/05/2015 - cost is $45  . Flu Shot  11/02/2016 - done today  . Shingles Vaccine  Completed  . Pneumonia vaccines  Completed

## 2016-01-26 ENCOUNTER — Telehealth: Payer: Self-pay | Admitting: Family Medicine

## 2016-01-26 MED ORDER — AMLODIPINE BESYLATE 5 MG PO TABS: 5.0000 mg | ORAL_TABLET | Freq: Every day | ORAL | 0 refills | Status: DC

## 2016-01-26 NOTE — Telephone Encounter (Signed)
Rx sent to pharmacy   

## 2016-03-30 ENCOUNTER — Other Ambulatory Visit: Payer: Self-pay | Admitting: Family Medicine

## 2016-04-15 ENCOUNTER — Encounter: Payer: Self-pay | Admitting: Nurse Practitioner

## 2016-04-15 ENCOUNTER — Ambulatory Visit (INDEPENDENT_AMBULATORY_CARE_PROVIDER_SITE_OTHER): Payer: Medicare Other | Admitting: Nurse Practitioner

## 2016-04-15 VITALS — BP 122/74 | HR 82 | Temp 97.5°F | Ht 69.0 in | Wt 191.0 lb

## 2016-04-15 DIAGNOSIS — R079 Chest pain, unspecified: Secondary | ICD-10-CM | POA: Diagnosis not present

## 2016-04-15 DIAGNOSIS — R52 Pain, unspecified: Secondary | ICD-10-CM

## 2016-04-15 DIAGNOSIS — Z20828 Contact with and (suspected) exposure to other viral communicable diseases: Secondary | ICD-10-CM | POA: Diagnosis not present

## 2016-04-15 LAB — VERITOR FLU A/B WAIVED
Influenza A: NEGATIVE
Influenza B: NEGATIVE

## 2016-04-15 MED ORDER — OSELTAMIVIR PHOSPHATE 75 MG PO CAPS: 75.0000 mg | ORAL_CAPSULE | Freq: Two times a day (BID) | ORAL | 0 refills | Status: DC

## 2016-04-15 NOTE — Progress Notes (Signed)
   Subjective:    Patient ID: Patrick Bell, male    DOB: 1941-03-01, 76 y.o.   MRN: [id number]  HPI Patient comes in with his wife c/o cough congestion and tight chest- Has history of asthma - He has been sleeping a lot- achy all over.    Review of Systems  Constitutional: Negative for chills and fever.  HENT: Positive for congestion and sinus pressure.   Respiratory: Positive for cough and shortness of breath.   Cardiovascular: Positive for chest pain.  Gastrointestinal: Negative.   Genitourinary: Negative.   Musculoskeletal: Negative.   Neurological: Negative.   Psychiatric/Behavioral: Negative.        Objective:   Physical Exam  Constitutional: He is oriented to person, place, and time. He appears well-developed and well-nourished.  HENT:  Right Ear: Hearing, tympanic membrane, external ear and ear canal normal.  Left Ear: Hearing, tympanic membrane, external ear and ear canal normal.  Nose: Mucosal edema and rhinorrhea present. Right sinus exhibits no maxillary sinus tenderness and no frontal sinus tenderness. Left sinus exhibits no maxillary sinus tenderness and no frontal sinus tenderness.  Mouth/Throat: Uvula is midline, oropharynx is clear and moist and mucous membranes are normal.  Cardiovascular: Normal rate and regular rhythm.   Pulmonary/Chest: Effort normal and breath sounds normal.  Neurological: He is alert and oriented to person, place, and time.  Skin: Skin is warm.  Psychiatric: He has a normal mood and affect. His behavior is normal. Judgment and thought content normal.    BP 122/74   Pulse 82   Temp 97.5 F (36.4 C) (Oral)   Ht 5\' 9"  (1.753 m)   Wt 191 lb (86.6 kg)   BMI 28.21 kg/m     EKG- NSR-[name], FNP   Flu negative- but wife positive for flu A  Assessment & Plan:   1. Body aches   2. Chest pain, unspecified type   3. Exposure to influenza    1. Take meds as prescribed 2. Use a cool mist humidifier especially during the  winter months and when heat has been humid. 3. Use saline nose sprays frequently 4. Saline irrigations of the nose can be very helpful if done frequently.  * 4X daily for 1 week*  * Use of a nettie pot can be helpful with this. Follow directions with this* 5. Drink plenty of fluids 6. Keep thermostat turn down low 7.For any cough or congestion  Use plain Mucinex- regular strength or max strength is fine   * Children- consult with Pharmacist for dosing 8. For fever or aces or pains- take tylenol or ibuprofen appropriate for age and weight.  * for fevers greater than 101 orally you may alternate ibuprofen and tylenol every  3 hours.   Meds ordered this encounter  Medications  . oseltamivir (TAMIFLU) 75 MG capsule    Sig: Take 1 capsule (75 mg total) by mouth 2 (two) times daily.    Dispense:  10 capsule    Refill:  0    Order Specific Question:   Supervising Provider    Answer:   Eustaquio Maize Cherry, FNP

## 2016-04-15 NOTE — Patient Instructions (Signed)

## 2016-05-31 ENCOUNTER — Other Ambulatory Visit: Payer: Self-pay | Admitting: Pharmacist

## 2016-07-21 ENCOUNTER — Other Ambulatory Visit: Payer: Self-pay | Admitting: Family Medicine

## 2016-07-21 DIAGNOSIS — K219 Gastro-esophageal reflux disease without esophagitis: Secondary | ICD-10-CM

## 2016-08-03 ENCOUNTER — Other Ambulatory Visit: Payer: Self-pay | Admitting: Pharmacist

## 2016-08-03 NOTE — Telephone Encounter (Signed)
Authorize 30 days only. Then contact the patient letting them know that they will need an appointment before any further prescriptions can be sent in. 

## 2016-08-04 NOTE — Telephone Encounter (Signed)
Left detailed message per dpr  

## 2016-08-08 DIAGNOSIS — M5137 Other intervertebral disc degeneration, lumbosacral region: Secondary | ICD-10-CM | POA: Diagnosis not present

## 2016-08-08 DIAGNOSIS — M9904 Segmental and somatic dysfunction of sacral region: Secondary | ICD-10-CM | POA: Diagnosis not present

## 2016-08-08 DIAGNOSIS — M9903 Segmental and somatic dysfunction of lumbar region: Secondary | ICD-10-CM | POA: Diagnosis not present

## 2016-08-08 DIAGNOSIS — M9905 Segmental and somatic dysfunction of pelvic region: Secondary | ICD-10-CM | POA: Diagnosis not present

## 2016-08-09 DIAGNOSIS — M9904 Segmental and somatic dysfunction of sacral region: Secondary | ICD-10-CM | POA: Diagnosis not present

## 2016-08-09 DIAGNOSIS — M9903 Segmental and somatic dysfunction of lumbar region: Secondary | ICD-10-CM | POA: Diagnosis not present

## 2016-08-09 DIAGNOSIS — M9905 Segmental and somatic dysfunction of pelvic region: Secondary | ICD-10-CM | POA: Diagnosis not present

## 2016-08-09 DIAGNOSIS — M5137 Other intervertebral disc degeneration, lumbosacral region: Secondary | ICD-10-CM | POA: Diagnosis not present

## 2016-08-11 ENCOUNTER — Encounter (HOSPITAL_COMMUNITY): Payer: Self-pay

## 2016-08-11 ENCOUNTER — Ambulatory Visit (HOSPITAL_COMMUNITY)
Admission: RE | Admit: 2016-08-11 | Discharge: 2016-08-11 | Disposition: A | Discharge status: Home / Self Care | Payer: Medicare Other | Source: Ambulatory Visit | Attending: Family Medicine | Admitting: Family Medicine

## 2016-08-11 ENCOUNTER — Encounter: Payer: Self-pay | Admitting: Family Medicine

## 2016-08-11 ENCOUNTER — Ambulatory Visit (INDEPENDENT_AMBULATORY_CARE_PROVIDER_SITE_OTHER): Payer: Medicare Other | Admitting: Family Medicine

## 2016-08-11 VITALS — BP 128/76 | HR 72 | Temp 97.5°F | Ht 69.0 in | Wt 194.0 lb

## 2016-08-11 DIAGNOSIS — N433 Hydrocele, unspecified: Secondary | ICD-10-CM | POA: Insufficient documentation

## 2016-08-11 DIAGNOSIS — E782 Mixed hyperlipidemia: Secondary | ICD-10-CM

## 2016-08-11 DIAGNOSIS — N5089 Other specified disorders of the male genital organs: Secondary | ICD-10-CM | POA: Insufficient documentation

## 2016-08-11 DIAGNOSIS — M9903 Segmental and somatic dysfunction of lumbar region: Secondary | ICD-10-CM | POA: Diagnosis not present

## 2016-08-11 DIAGNOSIS — N400 Enlarged prostate without lower urinary tract symptoms: Secondary | ICD-10-CM

## 2016-08-11 DIAGNOSIS — I10 Essential (primary) hypertension: Secondary | ICD-10-CM | POA: Diagnosis not present

## 2016-08-11 DIAGNOSIS — M9905 Segmental and somatic dysfunction of pelvic region: Secondary | ICD-10-CM | POA: Diagnosis not present

## 2016-08-11 DIAGNOSIS — M5137 Other intervertebral disc degeneration, lumbosacral region: Secondary | ICD-10-CM | POA: Diagnosis not present

## 2016-08-11 DIAGNOSIS — I861 Scrotal varices: Secondary | ICD-10-CM | POA: Diagnosis not present

## 2016-08-11 DIAGNOSIS — R7989 Other specified abnormal findings of blood chemistry: Secondary | ICD-10-CM

## 2016-08-11 DIAGNOSIS — R1031 Right lower quadrant pain: Secondary | ICD-10-CM | POA: Insufficient documentation

## 2016-08-11 DIAGNOSIS — M4186 Other forms of scoliosis, lumbar region: Secondary | ICD-10-CM | POA: Diagnosis not present

## 2016-08-11 DIAGNOSIS — R1030 Lower abdominal pain, unspecified: Secondary | ICD-10-CM | POA: Diagnosis not present

## 2016-08-11 DIAGNOSIS — M9904 Segmental and somatic dysfunction of sacral region: Secondary | ICD-10-CM | POA: Diagnosis not present

## 2016-08-11 LAB — POCT I-STAT CREATININE: Creatinine, Ser: 0.9 mg/dL (ref 0.61–1.24)

## 2016-08-11 MED ORDER — IOPAMIDOL (ISOVUE-300) INJECTION 61%
100.0000 mL | Freq: Once | INTRAVENOUS | Status: AC | PRN
Start: 2016-08-11 — End: 2016-08-11
  Administered 2016-08-11: 100 mL via INTRAVENOUS

## 2016-08-11 NOTE — Progress Notes (Signed)
BP 128/76   Pulse 72   Temp 97.5 F (36.4 C) (Oral)   Ht _0  (1.753 m)   Wt 194 lb (88 kg)   BMI 28.65 kg/m    Subjective:    Patient ID: Patrick Bell, male    DOB: May 26, 1940, 76 y.o.   MRN: [id number]  HPI: Patrick Bell is a 76 y.o. male presenting on 08/11/2016 for Groin Pain (right side, ongoing for a while, worse in last 5 days; history of hernia repair) and Labwork (patient came in earlier today for labs)   HPI Right-sided groin pain Patient has been having right-sided groin pain that has been off and on for a while. The groin pain which is in the right side of his groin has been going on for a while but has been worse over the past 5 days. He did have a hernia repair in the past and over the past week he had been working on his truck and had to do some bending and lifting and pushing things which he does not normally do and he feels like he's had the pain in his groin since that time. He was concerned about whether or not this could be related to that and he feels like it is swollen and very tender in that region. He denies any constipation or diarrhea or blood in the stool. He also feels like the swelling might be going down into the right side of his scrotum and the pain goes down into it sometimes as well.  Hyperlipidemia Patient is coming in for recheck of his hyperlipidemia. He is currently taking simvastatin. He denies any issues with myalgias or history of liver damage from it. He denies any focal numbness or weakness or chest pain.   Hypertension Patient is currently on amlodipine, and her blood pressure today is 128/76. Patient denies any lightheadedness or dizziness. Patient denies headaches, blurred vision, chest pains, shortness of breath, or weakness. Denies any side effects from medication and is content with current medication.   Low vitamin D and BPH recheck of labs. Patient coming in for recheck of vitamin D and prostate levels today. He denies any major  issues symptomatically with either of them currently.  Relevant past medical, surgical, family and social history reviewed and updated as indicated. Interim medical history since our last visit reviewed. Allergies and medications reviewed and updated.  Review of Systems  Constitutional: Negative for chills and fever.  Eyes: Negative for discharge.  Respiratory: Negative for shortness of breath and wheezing.   Cardiovascular: Negative for chest pain and leg swelling.  Gastrointestinal: Positive for abdominal pain. Negative for abdominal distention, constipation, diarrhea, nausea and vomiting.  Genitourinary: Positive for scrotal swelling.  Musculoskeletal: Negative for back pain and gait problem.  Skin: Negative for rash.  All other systems reviewed and are negative.   Per HPI unless specifically indicated above     Objective:    BP 128/76   Pulse 72   Temp 97.5 F (36.4 C) (Oral)   Ht _1  (1.753 m)   Wt 194 lb (88 kg)   BMI 28.65 kg/m   Wt Readings from Last 3 Encounters:  08/11/16 194 lb (88 kg)  04/15/16 191 lb (86.6 kg)  01/19/16 191 lb 8 oz (86.9 kg)    Physical Exam  Constitutional: He is oriented to person, place, and time. He appears well-developed and well-nourished. No distress.  Eyes: Conjunctivae are normal. No scleral icterus.  Neck: Neck supple. No  thyromegaly present.  Cardiovascular: Normal rate, regular rhythm, normal heart sounds and intact distal pulses.   No murmur heard. Pulmonary/Chest: Effort normal and breath sounds normal. No respiratory distress. He has no wheezes.  Abdominal: Soft. There is no hepatosplenomegaly. There is tenderness (Pain in right lower quadrant overlying the inguinal canal and ligament.) in the right lower quadrant. There is no CVA tenderness.  Genitourinary: Right testis shows no mass, no swelling (No swelling noted on exam) and no tenderness.  Musculoskeletal: Normal range of motion. He exhibits no edema.  Lymphadenopathy:      He has no cervical adenopathy.  Neurological: He is alert and oriented to person, place, and time. Coordination normal.  Skin: Skin is warm and dry. No rash noted. He is not diaphoretic.  Psychiatric: He has a normal mood and affect. His behavior is normal.  Nursing note and vitals reviewed.       Assessment & Plan:   Problem List Items Addressed This Visit      Cardiovascular and Mediastinum   Essential hypertension, benign   Relevant Orders   CMP14+EGFR (Completed)     Genitourinary   BPH (benign prostatic hyperplasia)   Relevant Orders   PSA, total and free (Completed)     Other   Hyperlipidemia   Relevant Orders   Lipid panel (Completed)   Low serum vitamin D   Relevant Orders   VITAMIN D 25 Hydroxy (Vit-D Deficiency, Fractures) (Completed)    Other Visit Diagnoses    Right inguinal pain    -  Primary   Relevant Orders   US Scrotum (Completed)   [place] Art/Ven Flow Abd Pelv Doppler (Completed)   CT Abdomen Pelvis W Contrast (Completed)   Scrotal swelling       Right-sided, per patient not noted on exam, will do ultrasound   Relevant Orders   US Scrotum (Completed)   [place] Art/Ven Flow Abd Pelv Doppler (Completed)   CT Abdomen Pelvis W Contrast (Completed)       Follow up plan: Return if symptoms worsen or fail to improve.  Counseling provided for all of the vaccine components Orders Placed This Encounter  Procedures  . US Scrotum  . [place] Art/Ven Flow Abd Pelv Doppler  . CT Abdomen Pelvis W Contrast  . CMP14+EGFR  . Lipid panel  . PSA, total and free  . VITAMIN D 25 Hydroxy (Vit-D Deficiency, Fractures)    Caryl Pina, MD [facility] 08/11/2016, 12:11 PM

## 2016-08-12 ENCOUNTER — Other Ambulatory Visit: Payer: Self-pay

## 2016-08-12 DIAGNOSIS — R972 Elevated prostate specific antigen [PSA]: Secondary | ICD-10-CM

## 2016-08-12 LAB — CMP14+EGFR
A/G RATIO: 2.1 (ref 1.2–2.2)
ALT: 7 IU/L (ref 0–44)
AST: 13 IU/L (ref 0–40)
Albumin: 4.2 g/dL (ref 3.5–4.8)
Alkaline Phosphatase: 65 IU/L (ref 39–117)
BUN / CREAT RATIO: 15 (ref 10–24)
BUN: 14 mg/dL (ref 8–27)
Bilirubin Total: 0.4 mg/dL (ref 0.0–1.2)
CALCIUM: 9 mg/dL (ref 8.6–10.2)
CO2: 23 mmol/L (ref 18–29)
CREATININE: 0.91 mg/dL (ref 0.76–1.27)
Chloride: 105 mmol/L (ref 96–106)
GFR calc non Af Amer: 81 mL/min/{1.73_m2} (ref >59)
GFR, EST AFRICAN AMERICAN: 94 mL/min/{1.73_m2} (ref >59)
GLOBULIN, TOTAL: 2 g/dL (ref 1.5–4.5)
Glucose: 97 mg/dL (ref 65–99)
Potassium: 4.4 mmol/L (ref 3.5–5.2)
Sodium: 144 mmol/L (ref 134–144)
Total Protein: 6.2 g/dL (ref 6.0–8.5)

## 2016-08-12 LAB — PSA, TOTAL AND FREE
PROSTATE SPECIFIC AG, SERUM: 5.6 ng/mL — AB (ref 0.0–4.0)
PSA FREE: 0.54 ng/mL
PSA, Free Pct: 9.6 %

## 2016-08-12 LAB — LIPID PANEL
Chol/HDL Ratio: 2.9 ratio (ref 0.0–5.0)
Cholesterol, Total: 203 mg/dL — ABNORMAL HIGH (ref 100–199)
HDL: 70 mg/dL (ref >39)
LDL CALC: 115 mg/dL — AB (ref 0–99)
Triglycerides: 91 mg/dL (ref 0–149)
VLDL CHOLESTEROL CAL: 18 mg/dL (ref 5–40)

## 2016-08-12 LAB — VITAMIN D 25 HYDROXY (VIT D DEFICIENCY, FRACTURES): Vit D, 25-Hydroxy: 19.4 ng/mL — ABNORMAL LOW (ref 30.0–100.0)

## 2016-08-15 DIAGNOSIS — M9903 Segmental and somatic dysfunction of lumbar region: Secondary | ICD-10-CM | POA: Diagnosis not present

## 2016-08-15 DIAGNOSIS — M9905 Segmental and somatic dysfunction of pelvic region: Secondary | ICD-10-CM | POA: Diagnosis not present

## 2016-08-15 DIAGNOSIS — M9904 Segmental and somatic dysfunction of sacral region: Secondary | ICD-10-CM | POA: Diagnosis not present

## 2016-08-15 DIAGNOSIS — M5137 Other intervertebral disc degeneration, lumbosacral region: Secondary | ICD-10-CM | POA: Diagnosis not present

## 2016-08-17 DIAGNOSIS — M9904 Segmental and somatic dysfunction of sacral region: Secondary | ICD-10-CM | POA: Diagnosis not present

## 2016-08-17 DIAGNOSIS — M9905 Segmental and somatic dysfunction of pelvic region: Secondary | ICD-10-CM | POA: Diagnosis not present

## 2016-08-17 DIAGNOSIS — M5137 Other intervertebral disc degeneration, lumbosacral region: Secondary | ICD-10-CM | POA: Diagnosis not present

## 2016-08-17 DIAGNOSIS — M9903 Segmental and somatic dysfunction of lumbar region: Secondary | ICD-10-CM | POA: Diagnosis not present

## 2016-08-18 DIAGNOSIS — M9904 Segmental and somatic dysfunction of sacral region: Secondary | ICD-10-CM | POA: Diagnosis not present

## 2016-08-18 DIAGNOSIS — M9905 Segmental and somatic dysfunction of pelvic region: Secondary | ICD-10-CM | POA: Diagnosis not present

## 2016-08-18 DIAGNOSIS — M5137 Other intervertebral disc degeneration, lumbosacral region: Secondary | ICD-10-CM | POA: Diagnosis not present

## 2016-08-18 DIAGNOSIS — M9903 Segmental and somatic dysfunction of lumbar region: Secondary | ICD-10-CM | POA: Diagnosis not present

## 2016-08-22 DIAGNOSIS — M9904 Segmental and somatic dysfunction of sacral region: Secondary | ICD-10-CM | POA: Diagnosis not present

## 2016-08-22 DIAGNOSIS — M9905 Segmental and somatic dysfunction of pelvic region: Secondary | ICD-10-CM | POA: Diagnosis not present

## 2016-08-22 DIAGNOSIS — M5137 Other intervertebral disc degeneration, lumbosacral region: Secondary | ICD-10-CM | POA: Diagnosis not present

## 2016-08-22 DIAGNOSIS — M9903 Segmental and somatic dysfunction of lumbar region: Secondary | ICD-10-CM | POA: Diagnosis not present

## 2016-08-24 DIAGNOSIS — M5137 Other intervertebral disc degeneration, lumbosacral region: Secondary | ICD-10-CM | POA: Diagnosis not present

## 2016-08-24 DIAGNOSIS — M9904 Segmental and somatic dysfunction of sacral region: Secondary | ICD-10-CM | POA: Diagnosis not present

## 2016-08-24 DIAGNOSIS — M9905 Segmental and somatic dysfunction of pelvic region: Secondary | ICD-10-CM | POA: Diagnosis not present

## 2016-08-24 DIAGNOSIS — M9903 Segmental and somatic dysfunction of lumbar region: Secondary | ICD-10-CM | POA: Diagnosis not present

## 2016-08-30 DIAGNOSIS — M9904 Segmental and somatic dysfunction of sacral region: Secondary | ICD-10-CM | POA: Diagnosis not present

## 2016-08-30 DIAGNOSIS — M5137 Other intervertebral disc degeneration, lumbosacral region: Secondary | ICD-10-CM | POA: Diagnosis not present

## 2016-08-30 DIAGNOSIS — M9903 Segmental and somatic dysfunction of lumbar region: Secondary | ICD-10-CM | POA: Diagnosis not present

## 2016-08-30 DIAGNOSIS — M9905 Segmental and somatic dysfunction of pelvic region: Secondary | ICD-10-CM | POA: Diagnosis not present

## 2016-09-01 DIAGNOSIS — M9904 Segmental and somatic dysfunction of sacral region: Secondary | ICD-10-CM | POA: Diagnosis not present

## 2016-09-01 DIAGNOSIS — M9905 Segmental and somatic dysfunction of pelvic region: Secondary | ICD-10-CM | POA: Diagnosis not present

## 2016-09-01 DIAGNOSIS — M5137 Other intervertebral disc degeneration, lumbosacral region: Secondary | ICD-10-CM | POA: Diagnosis not present

## 2016-09-01 DIAGNOSIS — M9903 Segmental and somatic dysfunction of lumbar region: Secondary | ICD-10-CM | POA: Diagnosis not present

## 2016-09-05 DIAGNOSIS — M9903 Segmental and somatic dysfunction of lumbar region: Secondary | ICD-10-CM | POA: Diagnosis not present

## 2016-09-05 DIAGNOSIS — M5137 Other intervertebral disc degeneration, lumbosacral region: Secondary | ICD-10-CM | POA: Diagnosis not present

## 2016-09-05 DIAGNOSIS — M9904 Segmental and somatic dysfunction of sacral region: Secondary | ICD-10-CM | POA: Diagnosis not present

## 2016-09-05 DIAGNOSIS — M9905 Segmental and somatic dysfunction of pelvic region: Secondary | ICD-10-CM | POA: Diagnosis not present

## 2016-09-08 DIAGNOSIS — M5137 Other intervertebral disc degeneration, lumbosacral region: Secondary | ICD-10-CM | POA: Diagnosis not present

## 2016-09-08 DIAGNOSIS — M9905 Segmental and somatic dysfunction of pelvic region: Secondary | ICD-10-CM | POA: Diagnosis not present

## 2016-09-08 DIAGNOSIS — M9904 Segmental and somatic dysfunction of sacral region: Secondary | ICD-10-CM | POA: Diagnosis not present

## 2016-09-08 DIAGNOSIS — M9903 Segmental and somatic dysfunction of lumbar region: Secondary | ICD-10-CM | POA: Diagnosis not present

## 2016-10-12 DIAGNOSIS — R1032 Left lower quadrant pain: Secondary | ICD-10-CM | POA: Diagnosis not present

## 2016-10-12 DIAGNOSIS — R972 Elevated prostate specific antigen [PSA]: Secondary | ICD-10-CM | POA: Diagnosis not present

## 2016-10-12 DIAGNOSIS — M545 Low back pain: Secondary | ICD-10-CM | POA: Diagnosis not present

## 2016-10-19 DIAGNOSIS — R972 Elevated prostate specific antigen [PSA]: Secondary | ICD-10-CM | POA: Diagnosis not present

## 2016-10-20 ENCOUNTER — Other Ambulatory Visit: Payer: Self-pay | Admitting: Pharmacist

## 2016-10-26 ENCOUNTER — Other Ambulatory Visit: Payer: Self-pay | Admitting: Nurse Practitioner

## 2016-10-26 DIAGNOSIS — K219 Gastro-esophageal reflux disease without esophagitis: Secondary | ICD-10-CM

## 2016-10-27 ENCOUNTER — Other Ambulatory Visit: Payer: Self-pay | Admitting: *Deleted

## 2016-10-27 MED ORDER — AMLODIPINE BESYLATE 5 MG PO TABS: 5.0000 mg | ORAL_TABLET | Freq: Every day | ORAL | 0 refills | Status: DC

## 2016-11-01 DIAGNOSIS — M5137 Other intervertebral disc degeneration, lumbosacral region: Secondary | ICD-10-CM | POA: Diagnosis not present

## 2016-11-01 DIAGNOSIS — M545 Low back pain: Secondary | ICD-10-CM | POA: Diagnosis not present

## 2016-11-01 DIAGNOSIS — M5106 Intervertebral disc disorders with myelopathy, lumbar region: Secondary | ICD-10-CM | POA: Diagnosis not present

## 2016-11-02 DIAGNOSIS — R1031 Right lower quadrant pain: Secondary | ICD-10-CM | POA: Diagnosis not present

## 2016-11-09 DIAGNOSIS — M5106 Intervertebral disc disorders with myelopathy, lumbar region: Secondary | ICD-10-CM | POA: Diagnosis not present

## 2016-11-14 DIAGNOSIS — M62838 Other muscle spasm: Secondary | ICD-10-CM | POA: Diagnosis not present

## 2016-11-14 DIAGNOSIS — M6281 Muscle weakness (generalized): Secondary | ICD-10-CM | POA: Diagnosis not present

## 2016-11-14 DIAGNOSIS — M545 Low back pain: Secondary | ICD-10-CM | POA: Diagnosis not present

## 2016-11-14 DIAGNOSIS — R1031 Right lower quadrant pain: Secondary | ICD-10-CM | POA: Diagnosis not present

## 2016-11-14 DIAGNOSIS — R1032 Left lower quadrant pain: Secondary | ICD-10-CM | POA: Diagnosis not present

## 2016-11-22 DIAGNOSIS — M6281 Muscle weakness (generalized): Secondary | ICD-10-CM | POA: Diagnosis not present

## 2016-11-22 DIAGNOSIS — M62838 Other muscle spasm: Secondary | ICD-10-CM | POA: Diagnosis not present

## 2016-11-22 DIAGNOSIS — R1031 Right lower quadrant pain: Secondary | ICD-10-CM | POA: Diagnosis not present

## 2016-11-22 DIAGNOSIS — M545 Low back pain: Secondary | ICD-10-CM | POA: Diagnosis not present

## 2016-12-14 DIAGNOSIS — R1032 Left lower quadrant pain: Secondary | ICD-10-CM | POA: Diagnosis not present

## 2016-12-14 DIAGNOSIS — M62838 Other muscle spasm: Secondary | ICD-10-CM | POA: Diagnosis not present

## 2016-12-14 DIAGNOSIS — R1031 Right lower quadrant pain: Secondary | ICD-10-CM | POA: Diagnosis not present

## 2016-12-14 DIAGNOSIS — M545 Low back pain: Secondary | ICD-10-CM | POA: Diagnosis not present

## 2016-12-14 DIAGNOSIS — M6281 Muscle weakness (generalized): Secondary | ICD-10-CM | POA: Diagnosis not present

## 2016-12-27 DIAGNOSIS — K5900 Constipation, unspecified: Secondary | ICD-10-CM | POA: Diagnosis not present

## 2016-12-27 DIAGNOSIS — R1031 Right lower quadrant pain: Secondary | ICD-10-CM | POA: Diagnosis not present

## 2016-12-27 DIAGNOSIS — M6281 Muscle weakness (generalized): Secondary | ICD-10-CM | POA: Diagnosis not present

## 2016-12-27 DIAGNOSIS — M62838 Other muscle spasm: Secondary | ICD-10-CM | POA: Diagnosis not present

## 2017-01-09 DIAGNOSIS — M6281 Muscle weakness (generalized): Secondary | ICD-10-CM | POA: Diagnosis not present

## 2017-01-09 DIAGNOSIS — M62838 Other muscle spasm: Secondary | ICD-10-CM | POA: Diagnosis not present

## 2017-01-09 DIAGNOSIS — K5900 Constipation, unspecified: Secondary | ICD-10-CM | POA: Diagnosis not present

## 2017-01-09 DIAGNOSIS — R1031 Right lower quadrant pain: Secondary | ICD-10-CM | POA: Diagnosis not present

## 2017-01-18 ENCOUNTER — Encounter: Payer: Self-pay | Admitting: *Deleted

## 2017-01-18 ENCOUNTER — Ambulatory Visit (INDEPENDENT_AMBULATORY_CARE_PROVIDER_SITE_OTHER): Payer: Medicare Other | Admitting: *Deleted

## 2017-01-18 VITALS — BP 117/64 | HR 68 | Ht 70.0 in | Wt 194.0 lb

## 2017-01-18 DIAGNOSIS — Z23 Encounter for immunization: Secondary | ICD-10-CM

## 2017-01-18 DIAGNOSIS — Z0000 Encounter for general adult medical examination without abnormal findings: Secondary | ICD-10-CM

## 2017-01-18 NOTE — Addendum Note (Signed)
Addended by: Ilean China on: 01/18/2017 11:19 AM   Modules accepted: Level of Service

## 2017-01-18 NOTE — Patient Instructions (Addendum)
  [name] , Thank you for taking time to come for your Medicare Wellness Visit. I appreciate your ongoing commitment to your health goals. Please review the following plan we discussed and let me know if I can assist you in the future.   These are the goals we discussed: Goals    . Exercise 150 minutes per week (moderate activity)       This is a list of the screening recommended for you and due dates:  Health Maintenance  Topic Date Due  . Tetanus Vaccine  04/05/2015  . Flu Shot  11/02/2016  . Pneumonia vaccines  Completed

## 2017-01-18 NOTE — Progress Notes (Signed)
Subjective:   Patrick Bell is a 76 y.o. male who presents for an Initial Medicare Annual Wellness Visit. Patrick Bell is retired from [employer] and lives at home with his wife.   Review of Systems  His health is about the same as last year.   Cardiac Risk Factors include: advanced age (>13men, >75 women);dyslipidemia;male gender;sedentary lifestyle  Other systems negative today.    Objective:    Today's Vitals   01/18/17 0929  BP: 117/64  Pulse: 68  Weight: 194 lb (88 kg)  Height: 5\' 10"  (1.778 m)   Body mass index is 27.84 kg/m.  Current Medications (verified) Outpatient Encounter Prescriptions as of 01/18/2017  Medication Sig  . albuterol (PROVENTIL HFA;VENTOLIN HFA) 108 (90 Base) MCG/ACT inhaler Inhale 2 puffs into the lungs every 6 (six) hours as needed for wheezing.  Marland Kitchen amLODipine (NORVASC) 5 MG tablet Take 1 tablet (5 mg total) by mouth daily.  . fexofenadine-pseudoephedrine (ALLEGRA-D) 60-120 MG 12 hr tablet Take 1 po bid (Patient taking differently: Take 1 po bid as needed)  . montelukast (SINGULAIR) 10 MG tablet TAKE 1 TABLET DAILY  . Olopatadine HCl (PATADAY) 0.2 % SOLN INSTILL 1 DROP IN [facility] EYE TWICE DAILY  . pantoprazole (PROTONIX) 40 MG tablet TAKE 1 TABLET DAILY  . simvastatin (ZOCOR) 10 MG tablet TAKE ONE TABLET AT BEDTIME  . [DISCONTINUED] simvastatin (ZOCOR) 10 MG tablet TAKE ONE TABLET AT BEDTIME   No facility-administered encounter medications on file as of 01/18/2017.     Allergies (verified) Patient has no known allergies.   History: Past Medical History:  Diagnosis Date  . Allergy   . Asthma   . Cataract   . Cataract   . Diverticulitis   . GERD (gastroesophageal reflux disease)   . Hyperlipidemia   . Hypertension    Past Surgical History:  Procedure Laterality Date  . bilateral inguinal hernia    . COLONOSCOPY  06/09/2004   [id number] colon diverticulosis, otherwise normal colonoscopy  . EYE SURGERY  08/2014  . HERNIA  REPAIR    . PALATE / UVULA BIOPSY / EXCISION    . TONSILLECTOMY    . VASECTOMY     Family History  Problem Relation Age of Onset  . Heart disease Mother   . Hip fracture Mother 83  . Osteoporosis Mother   . Dementia Mother 38  . Arthritis Father   . Heart disease Father   . Arthritis Sister   . Arthritis Brother   . Colon cancer Neg Hx    Social History   Occupational History  . retired Holiday representative    truck driving   Social History Main Topics  . Smoking status: Former Smoker    Packs/day: 4.00    Years: 25.00    Types: Cigarettes    Quit date: 06/23/1982  . 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 Alcohol use 0.0 oz/week     Comment: brandy a few times a month, beer once a month  . Drug use: No  . Sexual activity: Yes   Tobacco Counseling No tobacco use  Activities of Daily Living In your present state of health, do you have any difficulty performing the following activities: 01/18/2017 01/19/2016  Hearing? Y N  Comment some high frequency hearing loss. It's not enough to worry about but his wife is encouraging him to have hearing assessed.  saw audiolist 1 year ago - not at point of  getting hearing aids  Vision? N N  Difficulty concentrating or making decisions? Y N  Comment has some difficulty remembering names -  Walking or climbing stairs? N N  Dressing or bathing? N N  Doing errands, shopping? N N  Preparing Food and eating ? N N  Using the Toilet? N N  In the past six months, have you accidently leaked urine? N N  Do you have problems with loss of bowel control? N N  Managing your Medications? N N  Managing your Finances? N N  Housekeeping or managing your Housekeeping? N N  Some recent data might be hidden    Immunizations and Health Maintenance Immunization History  Administered Date(s) Administered  . Influenza,inj,Quad PF,6+ Mos 01/14/2013, 03/14/2014, 03/11/2015, 01/19/2016  . Pneumococcal Conjugate-13 03/14/2014    . Pneumococcal Polysaccharide-23 01/14/2013  . Zoster 06/14/2011   Health Maintenance Due  Topic Date Due  . TETANUS/TDAP  04/05/2015  . INFLUENZA VACCINE  11/02/2016    Patient Care Team: Dettinger, Fransisca Kaufmann, MD as PCP - General (Family Medicine) Danie Binder, MD as Attending Physician (Gastroenterology) Ralene Muskrat as Physician Assistant (Chiropractic Medicine) Druscilla Brownie, MD as Consulting Physician (Dermatology)  No hospitalizations, ER visits, or surgeries this past year.     Assessment:   This is a routine wellness examination for [name].   Hearing/Vision screen No deficit noted during visit. Has been evaluated for mild hearing loss. Eye exam is not up to date.   Dietary issues and exercise activities discussed: Current Exercise Habits: Home exercise routine, Type of exercise: walking, Time (Minutes): 30, Frequency (Times/Week): 7, Weekly Exercise (Minutes/Week): 210, Intensity: Mild, Exercise limited by: None identified  Goals    . Exercise 150 minutes per week (moderate activity)      Depression Screen PHQ 2/9 Scores 08/11/2016 04/15/2016 01/19/2016 03/11/2015  PHQ - 2 Score 0 0 0 0    Fall Risk Fall Risk  08/11/2016 04/15/2016 01/19/2016 03/11/2015 12/03/2014  Falls in the past year? No No No No No    Cognitive Function: MMSE - Mini Mental State Exam 01/18/2017 01/19/2016 03/11/2015  Orientation to time 5 5 5   Orientation to Place 5 5 5   Registration 3 3 3   Attention/ Calculation 3 5 5   Recall 3 3 2   Language- name 2 objects 2 2 2   Language- repeat 1 1 1   Language- follow 3 step command 3 3 3   Language- read & follow direction 1 1 1   Write a sentence 1 1 1   Copy design 1 1 1   Total score 28 30 29         Screening Tests Health Maintenance  Topic Date Due  . TETANUS/TDAP  04/05/2015  . INFLUENZA VACCINE  11/02/2016  . PNA vac Low Risk Adult  Completed        Plan:    Flu shot given today Schedule eye exam CPE with Dr Dettinger  scheduled  Increase physical activity with a goal of at least 150 min of moderate activity weekly   I have personally reviewed and noted the following in the patient's chart:   . Medical and social history . Use of alcohol, tobacco or illicit drugs  . Current medications and supplements . Functional ability and status . Nutritional status . Physical activity . Advanced directives . List of other physicians . Hospitalizations, surgeries, and ER visits in previous 12 months . Vitals . Screenings to include cognitive, depression, and falls . Referrals and appointments  In addition, I  have reviewed and discussed with patient certain preventive protocols, quality metrics, and best practice recommendations. A written personalized care plan for preventive services as well as general preventive health recommendations were provided to patient.     Chong Sicilian, RN   01/18/2017

## 2017-02-06 ENCOUNTER — Ambulatory Visit (INDEPENDENT_AMBULATORY_CARE_PROVIDER_SITE_OTHER): Payer: Medicare Other | Admitting: Family Medicine

## 2017-02-06 ENCOUNTER — Encounter: Payer: Self-pay | Admitting: Family Medicine

## 2017-02-06 VITALS — BP 130/79 | HR 66 | Temp 97.4°F | Ht 70.0 in | Wt 194.0 lb

## 2017-02-06 DIAGNOSIS — K219 Gastro-esophageal reflux disease without esophagitis: Secondary | ICD-10-CM | POA: Diagnosis not present

## 2017-02-06 DIAGNOSIS — I10 Essential (primary) hypertension: Secondary | ICD-10-CM

## 2017-02-06 DIAGNOSIS — E782 Mixed hyperlipidemia: Secondary | ICD-10-CM | POA: Diagnosis not present

## 2017-02-06 MED ORDER — PANTOPRAZOLE SODIUM 40 MG PO TBEC: 40.0000 mg | DELAYED_RELEASE_TABLET | Freq: Every day | ORAL | 3 refills | Status: DC

## 2017-02-06 MED ORDER — AMLODIPINE BESYLATE 5 MG PO TABS: 5.0000 mg | ORAL_TABLET | Freq: Every day | ORAL | 3 refills | Status: DC

## 2017-02-06 MED ORDER — SIMVASTATIN 10 MG PO TABS: 10.0000 mg | ORAL_TABLET | Freq: Every day | ORAL | 3 refills | Status: DC

## 2017-02-06 MED ORDER — MONTELUKAST SODIUM 10 MG PO TABS: 10.0000 mg | ORAL_TABLET | Freq: Every day | ORAL | 3 refills | Status: DC

## 2017-02-06 NOTE — Progress Notes (Signed)
BP 130/79   Pulse 66   Temp (!) 97.4 F (36.3 C) (Oral)   Ht 5' 10"  (1.778 m)   Wt 194 lb (88 kg)   BMI 27.84 kg/m    Subjective:    Patient ID: Patrick Bell, male    DOB: 01-Sep-1940, 76 y.o.   MRN: [id number]  HPI: Patrick Bell is a 76 y.o. male presenting on 02/06/2017 for Annual Exam (labwork patient fasting; would like scripts written for one year)   HPI Patient is coming in for labs and physical and recheck of his medications  Hypertension Patient is currently on amlodipine but has not taken it for 4 days and wants to trial off of it, he has a machine to measure at home, and their blood pressure today is 130/79. Patient denies any lightheadedness or dizziness. Patient denies headaches, blurred vision, chest pains, shortness of breath, or weakness. Denies any side effects from medication and is content with current medication.   Hyperlipidemia Patient is coming in for recheck of his hyperlipidemia. The patient is currently taking simvastatin. They deny any issues with myalgias or history of liver damage from it. They deny any focal numbness or weakness or chest pain.   GERD Patient is currently on Protonix.  She denies any major symptoms or abdominal pain or belching or burping. She denies any blood in her stool or lightheadedness or dizziness.   Relevant past medical, surgical, family and social history reviewed and updated as indicated. Interim medical history since our last visit reviewed. Allergies and medications reviewed and updated.  Review of Systems  Constitutional: Negative for chills and fever.  HENT: Negative for ear pain and tinnitus.   Eyes: Negative for pain and discharge.  Respiratory: Negative for cough, shortness of breath and wheezing.   Cardiovascular: Negative for chest pain, palpitations and leg swelling.  Gastrointestinal: Negative for abdominal pain, blood in stool, constipation and diarrhea.  Genitourinary: Negative for dysuria and hematuria.   Musculoskeletal: Negative for back pain, gait problem and myalgias.  Skin: Negative for rash.  Neurological: Negative for dizziness, weakness and headaches.  Psychiatric/Behavioral: Negative for suicidal ideas.  All other systems reviewed and are negative.   Per HPI unless specifically indicated above   Allergies as of 02/06/2017   No Known Allergies     Medication List        Accurate as of 02/06/17 10:58 AM. Always use your most recent med list.          albuterol 108 (90 Base) MCG/ACT inhaler Commonly known as:  PROVENTIL HFA;VENTOLIN HFA Inhale 2 puffs into the lungs every 6 (six) hours as needed for wheezing.   amLODipine 5 MG tablet Commonly known as:  NORVASC Take 1 tablet (5 mg total) by mouth daily.   fexofenadine-pseudoephedrine 60-120 MG 12 hr tablet Commonly known as:  ALLEGRA-D Take 1 po bid   montelukast 10 MG tablet Commonly known as:  SINGULAIR TAKE 1 TABLET DAILY   Olopatadine HCl 0.2 % Soln Commonly known as:  PATADAY INSTILL 1 DROP IN EACH EYE TWICE DAILY   pantoprazole 40 MG tablet Commonly known as:  PROTONIX TAKE 1 TABLET DAILY   simvastatin 10 MG tablet Commonly known as:  ZOCOR TAKE ONE TABLET AT BEDTIME          Objective:    BP 130/79   Pulse 66   Temp (!) 97.4 F (36.3 C) (Oral)   Ht 5' 10"  (1.778 m)   Wt 194 lb (88  kg)   BMI 27.84 kg/m   Wt Readings from Last 3 Encounters:  02/06/17 194 lb (88 kg)  01/18/17 194 lb (88 kg)  08/11/16 194 lb (88 kg)    Physical Exam  Constitutional: He is oriented to person, place, and time. He appears well-developed and well-nourished. No distress.  HENT:  Right Ear: External ear normal.  Left Ear: External ear normal.  Nose: Nose normal.  Mouth/Throat: Oropharynx is clear and moist. No oropharyngeal exudate.  Eyes: Conjunctivae and EOM are normal. Pupils are equal, round, and reactive to light. Right eye exhibits no discharge. No scleral icterus.  Neck: Neck supple. No thyromegaly  present.  Cardiovascular: Normal rate, regular rhythm, normal heart sounds and intact distal pulses.  No murmur heard. Pulmonary/Chest: Effort normal and breath sounds normal. No respiratory distress. He has no wheezes.  Abdominal: Soft. Bowel sounds are normal. He exhibits no distension. There is no tenderness. There is no rebound and no guarding.  Musculoskeletal: Normal range of motion. He exhibits no edema.  Lymphadenopathy:    He has no cervical adenopathy.  Neurological: He is alert and oriented to person, place, and time. Coordination normal.  Skin: Skin is warm and dry. No rash noted. He is not diaphoretic.  Psychiatric: He has a normal mood and affect. His behavior is normal.  Nursing note and vitals reviewed.     Assessment & Plan:   Problem List Items Addressed This Visit      Cardiovascular and Mediastinum   Essential hypertension, benign - Primary   Relevant Medications   amLODipine (NORVASC) 5 MG tablet   simvastatin (ZOCOR) 10 MG tablet   Other Relevant Orders   CMP14+EGFR     Digestive   GERD (gastroesophageal reflux disease)   Relevant Medications   pantoprazole (PROTONIX) 40 MG tablet   Other Relevant Orders   CBC with Differential/Platelet     Other   Hyperlipidemia   Relevant Medications   amLODipine (NORVASC) 5 MG tablet   simvastatin (ZOCOR) 10 MG tablet   Other Relevant Orders   Lipid panel       Follow up plan: Return in about 1 year (around 02/06/2018), or if symptoms worsen or fail to improve.  Counseling provided for all of the vaccine components No orders of the defined types were placed in this encounter.   Caryl Pina, MD [facility] 02/06/2017, 10:58 AM

## 2017-02-07 LAB — LIPID PANEL
CHOL/HDL RATIO: 2.9 ratio (ref 0.0–5.0)
Cholesterol, Total: 212 mg/dL — ABNORMAL HIGH (ref 100–199)
HDL: 74 mg/dL (ref >39)
LDL CALC: 124 mg/dL — AB (ref 0–99)
Triglycerides: 71 mg/dL (ref 0–149)
VLDL Cholesterol Cal: 14 mg/dL (ref 5–40)

## 2017-02-07 LAB — CMP14+EGFR
ALT: 10 IU/L (ref 0–44)
AST: 16 IU/L (ref 0–40)
Albumin/Globulin Ratio: 2.1 (ref 1.2–2.2)
Albumin: 4.2 g/dL (ref 3.5–4.8)
Alkaline Phosphatase: 66 IU/L (ref 39–117)
BILIRUBIN TOTAL: 0.5 mg/dL (ref 0.0–1.2)
BUN / CREAT RATIO: 16 (ref 10–24)
BUN: 13 mg/dL (ref 8–27)
CALCIUM: 9 mg/dL (ref 8.6–10.2)
CO2: 25 mmol/L (ref 20–29)
CREATININE: 0.83 mg/dL (ref 0.76–1.27)
Chloride: 104 mmol/L (ref 96–106)
GFR calc Af Amer: 98 mL/min/{1.73_m2} (ref >59)
GFR calc non Af Amer: 85 mL/min/{1.73_m2} (ref >59)
GLUCOSE: 84 mg/dL (ref 65–99)
Globulin, Total: 2 g/dL (ref 1.5–4.5)
POTASSIUM: 4.2 mmol/L (ref 3.5–5.2)
Sodium: 142 mmol/L (ref 134–144)
Total Protein: 6.2 g/dL (ref 6.0–8.5)

## 2017-02-07 LAB — CBC WITH DIFFERENTIAL/PLATELET
BASOS: 1 %
Basophils Absolute: 0 10*3/uL (ref 0.0–0.2)
EOS (ABSOLUTE): 0.1 10*3/uL (ref 0.0–0.4)
EOS: 1 %
HEMATOCRIT: 45.7 % (ref 37.5–51.0)
HEMOGLOBIN: 15.1 g/dL (ref 13.0–17.7)
Immature Grans (Abs): 0 10*3/uL (ref 0.0–0.1)
Immature Granulocytes: 0 %
LYMPHS ABS: 1.1 10*3/uL (ref 0.7–3.1)
Lymphs: 23 %
MCH: 28.5 pg (ref 26.6–33.0)
MCHC: 33 g/dL (ref 31.5–35.7)
MCV: 86 fL (ref 79–97)
MONOCYTES: 10 %
MONOS ABS: 0.5 10*3/uL (ref 0.1–0.9)
NEUTROS ABS: 3.1 10*3/uL (ref 1.4–7.0)
Neutrophils: 65 %
Platelets: 212 10*3/uL (ref 150–379)
RBC: 5.29 x10E6/uL (ref 4.14–5.80)
RDW: 13.9 % (ref 12.3–15.4)
WBC: 4.8 10*3/uL (ref 3.4–10.8)

## 2017-02-28 ENCOUNTER — Telehealth: Payer: Self-pay | Admitting: Family Medicine

## 2017-02-28 NOTE — Telephone Encounter (Signed)
Pt aware of results 

## 2017-03-29 DIAGNOSIS — G4733 Obstructive sleep apnea (adult) (pediatric): Secondary | ICD-10-CM | POA: Diagnosis not present

## 2017-04-06 ENCOUNTER — Other Ambulatory Visit: Payer: Self-pay | Admitting: Nurse Practitioner

## 2017-04-06 DIAGNOSIS — K219 Gastro-esophageal reflux disease without esophagitis: Secondary | ICD-10-CM

## 2017-05-19 ENCOUNTER — Encounter: Payer: Self-pay | Admitting: Family Medicine

## 2017-05-19 ENCOUNTER — Ambulatory Visit (INDEPENDENT_AMBULATORY_CARE_PROVIDER_SITE_OTHER): Payer: Medicare Other | Admitting: Family Medicine

## 2017-05-19 VITALS — BP 136/77 | HR 78 | Temp 99.8°F | Ht 70.0 in | Wt 187.5 lb

## 2017-05-19 DIAGNOSIS — R1030 Lower abdominal pain, unspecified: Secondary | ICD-10-CM

## 2017-05-19 DIAGNOSIS — R0789 Other chest pain: Secondary | ICD-10-CM

## 2017-05-19 DIAGNOSIS — R52 Pain, unspecified: Secondary | ICD-10-CM | POA: Diagnosis not present

## 2017-05-19 LAB — VERITOR FLU A/B WAIVED
Influenza A: NEGATIVE
Influenza B: NEGATIVE

## 2017-05-19 NOTE — Progress Notes (Signed)
BP 136/77   Pulse 78   Temp 99.8 F (37.7 C) (Oral)   Ht 5' 10"  (1.778 m)   Wt 187 lb 8 oz (85 kg)   SpO2 98%   BMI 26.90 kg/m    Subjective:    Patient ID: Patrick Bell, male    DOB: 21-Feb-1941, 77 y.o.   MRN: [id number]  HPI: Patrick Bell is a 77 y.o. male presenting on 05/19/2017 for Body aches, fatigue, SOB (x 3 days); Constipation; and Pain in right side of head, above ear   HPI Fatigue and body aches and chest tightness and headache and constipation Patient comes in today complaining of fatigue and body aches and chest tightness and a headache that is on the right side that started from the back of his head and comes up to the right temple.  He is also been feeling lightheaded and this is all been going on for about the past 4-5 days.  He does admit that he has not had a bowel movement in at least 2-3 days and he is having lower abdominal pressure and pain which was similar to what he had last year.  He says is also this time having some chest tightness that feels kind of like when he had asthma when he was a lot younger but he had that anytime recently since he has been on the Singulair.  He denies any blood in his stool.  He did have a previous history of inguinal hernias but had those repaired and does have an umbilical hernia that has not been repaired but he denies any pain around those but the pain is more in the suprapubic region.  He denies any radiation of the pain.  His chest pressure does not radiate anywhere else either.  He has felt a little bit of nausea over the past couple days but has been eating fine.  She denies any history of migraines and the headaches come and go quickly last about a few minutes to an hour and are more of a sharp shooting and tightness.  He does feel like those could be related to some muscle spasms that he has been having in his back.  Relevant past medical, surgical, family and social history reviewed and updated as indicated. Interim  medical history since our last visit reviewed. Allergies and medications reviewed and updated.  Review of Systems  Constitutional: Positive for fatigue. Negative for chills and fever.  HENT: Negative for congestion, sinus pressure and sinus pain.   Respiratory: Negative for cough, shortness of breath and wheezing.   Cardiovascular: Negative for chest pain and leg swelling.  Gastrointestinal: Positive for abdominal distention, abdominal pain and constipation. Negative for blood in stool, diarrhea, nausea and vomiting.  Genitourinary: Negative for difficulty urinating, dysuria and hematuria.  Musculoskeletal: Positive for myalgias. Negative for back pain and gait problem.  Skin: Negative for rash.  Neurological: Positive for dizziness, light-headedness and headaches. Negative for weakness and numbness.  All other systems reviewed and are negative.   Per HPI unless specifically indicated above   Allergies as of 05/19/2017   No Known Allergies     Medication List        Accurate as of 05/19/17 10:18 AM. Always use your most recent med list.          albuterol 108 (90 Base) MCG/ACT inhaler Commonly known as:  PROVENTIL HFA;VENTOLIN HFA Inhale 2 puffs into the lungs every 6 (six) hours as needed for wheezing.  amLODipine 5 MG tablet Commonly known as:  NORVASC Take 1 tablet (5 mg total) daily by mouth.   fexofenadine-pseudoephedrine 60-120 MG 12 hr tablet Commonly known as:  ALLEGRA-D Take 1 po bid   montelukast 10 MG tablet Commonly known as:  SINGULAIR Take 1 tablet (10 mg total) daily by mouth.   Olopatadine HCl 0.2 % Soln Commonly known as:  PATADAY INSTILL 1 DROP IN EACH EYE TWICE DAILY   simvastatin 10 MG tablet Commonly known as:  ZOCOR Take 1 tablet (10 mg total) at bedtime by mouth.          Objective:    BP 136/77   Pulse 78   Temp 99.8 F (37.7 C) (Oral)   Ht 5' 10"  (1.778 m)   Wt 187 lb 8 oz (85 kg)   SpO2 98%   BMI 26.90 kg/m   Wt Readings  from Last 3 Encounters:  05/19/17 187 lb 8 oz (85 kg)  02/06/17 194 lb (88 kg)  01/18/17 194 lb (88 kg)    Physical Exam  Constitutional: He is oriented to person, place, and time. He appears well-developed and well-nourished. No distress.  HENT:  Right Ear: External ear normal.  Left Ear: External ear normal.  Nose: Nose normal.  Mouth/Throat: Oropharynx is clear and moist. No oropharyngeal exudate.  Eyes: Conjunctivae and EOM are normal. Pupils are equal, round, and reactive to light. Right eye exhibits no discharge. Left eye exhibits no discharge. No scleral icterus.  Neck: Neck supple. No thyromegaly present.  Cardiovascular: Normal rate, regular rhythm, normal heart sounds and intact distal pulses.  No murmur heard. Pulmonary/Chest: Effort normal and breath sounds normal. No respiratory distress. He has no wheezes. He has no rales.  Abdominal: Soft. Bowel sounds are normal. He exhibits no distension. There is no hepatosplenomegaly. There is tenderness in the suprapubic area. There is no rigidity, no rebound, no guarding and no CVA tenderness.  Musculoskeletal: Normal range of motion. He exhibits no edema.  Lymphadenopathy:       Right: No inguinal adenopathy present.       Left: No inguinal adenopathy present.  Neurological: He is alert and oriented to person, place, and time. Coordination normal.  Skin: Skin is warm and dry. No rash noted. He is not diaphoretic.  Psychiatric: He has a normal mood and affect. His behavior is normal. Thought content normal.  Nursing note and vitals reviewed.   EKG: NSR Influenza, negative    Assessment & Plan:   Problem List Items Addressed This Visit    None    Visit Diagnoses    Body aches    -  Primary   Relevant Orders   Veritor Flu A/B Waived   CBC with Differential/Platelet   CMP14+EGFR   Sedimentation rate   Chest tightness or pressure       Relevant Orders   EKG 12-Lead (Completed)   CBC with Differential/Platelet    CMP14+EGFR   Sedimentation rate   Lower abdominal pain          Concern for possible development of a small bowel obstruction, has had a previous CAT scan that showed adhesions.  A lot of his other symptoms may be related to what is going on in his abdomen.  Patient would like to hold off on the CT scan of the hospitalization for now and will try enema and MiraLAX and clear liquids for the next couple days.  We will go to the hospital if he starts to  have vomiting.  He will call back on Monday if he is still not had bowel movements and is still having pain for possible stat CT on Monday.  Follow up plan: Return if symptoms worsen or fail to improve.  Counseling provided for all of the vaccine components Orders Placed This Encounter  Procedures  . Veritor Flu A/B Waived  . CBC with Differential/Platelet  . CMP14+EGFR  . Sedimentation rate  . EKG 12-Lead     Caryl Pina, MD [facility] 05/19/2017, 10:18 AM

## 2017-07-24 DIAGNOSIS — M9904 Segmental and somatic dysfunction of sacral region: Secondary | ICD-10-CM | POA: Diagnosis not present

## 2017-07-24 DIAGNOSIS — M9903 Segmental and somatic dysfunction of lumbar region: Secondary | ICD-10-CM | POA: Diagnosis not present

## 2017-07-24 DIAGNOSIS — M5137 Other intervertebral disc degeneration, lumbosacral region: Secondary | ICD-10-CM | POA: Diagnosis not present

## 2017-07-24 DIAGNOSIS — M9905 Segmental and somatic dysfunction of pelvic region: Secondary | ICD-10-CM | POA: Diagnosis not present

## 2017-07-25 DIAGNOSIS — M9905 Segmental and somatic dysfunction of pelvic region: Secondary | ICD-10-CM | POA: Diagnosis not present

## 2017-07-25 DIAGNOSIS — M9904 Segmental and somatic dysfunction of sacral region: Secondary | ICD-10-CM | POA: Diagnosis not present

## 2017-07-25 DIAGNOSIS — M5137 Other intervertebral disc degeneration, lumbosacral region: Secondary | ICD-10-CM | POA: Diagnosis not present

## 2017-07-25 DIAGNOSIS — M9903 Segmental and somatic dysfunction of lumbar region: Secondary | ICD-10-CM | POA: Diagnosis not present

## 2017-07-27 ENCOUNTER — Other Ambulatory Visit: Payer: Self-pay | Admitting: *Deleted

## 2017-07-27 DIAGNOSIS — M9903 Segmental and somatic dysfunction of lumbar region: Secondary | ICD-10-CM | POA: Diagnosis not present

## 2017-07-27 DIAGNOSIS — M9905 Segmental and somatic dysfunction of pelvic region: Secondary | ICD-10-CM | POA: Diagnosis not present

## 2017-07-27 DIAGNOSIS — M5137 Other intervertebral disc degeneration, lumbosacral region: Secondary | ICD-10-CM | POA: Diagnosis not present

## 2017-07-27 DIAGNOSIS — M9904 Segmental and somatic dysfunction of sacral region: Secondary | ICD-10-CM | POA: Diagnosis not present

## 2017-07-27 MED ORDER — OLOPATADINE HCL 0.2 % OP SOLN: OPHTHALMIC | 1 refills | Status: DC

## 2017-07-31 DIAGNOSIS — M9904 Segmental and somatic dysfunction of sacral region: Secondary | ICD-10-CM | POA: Diagnosis not present

## 2017-07-31 DIAGNOSIS — M5137 Other intervertebral disc degeneration, lumbosacral region: Secondary | ICD-10-CM | POA: Diagnosis not present

## 2017-07-31 DIAGNOSIS — M9905 Segmental and somatic dysfunction of pelvic region: Secondary | ICD-10-CM | POA: Diagnosis not present

## 2017-07-31 DIAGNOSIS — M9903 Segmental and somatic dysfunction of lumbar region: Secondary | ICD-10-CM | POA: Diagnosis not present

## 2017-08-02 DIAGNOSIS — M5137 Other intervertebral disc degeneration, lumbosacral region: Secondary | ICD-10-CM | POA: Diagnosis not present

## 2017-08-02 DIAGNOSIS — M9903 Segmental and somatic dysfunction of lumbar region: Secondary | ICD-10-CM | POA: Diagnosis not present

## 2017-08-02 DIAGNOSIS — M9905 Segmental and somatic dysfunction of pelvic region: Secondary | ICD-10-CM | POA: Diagnosis not present

## 2017-08-02 DIAGNOSIS — M9904 Segmental and somatic dysfunction of sacral region: Secondary | ICD-10-CM | POA: Diagnosis not present

## 2017-08-07 DIAGNOSIS — M9904 Segmental and somatic dysfunction of sacral region: Secondary | ICD-10-CM | POA: Diagnosis not present

## 2017-08-07 DIAGNOSIS — M5137 Other intervertebral disc degeneration, lumbosacral region: Secondary | ICD-10-CM | POA: Diagnosis not present

## 2017-08-07 DIAGNOSIS — M9905 Segmental and somatic dysfunction of pelvic region: Secondary | ICD-10-CM | POA: Diagnosis not present

## 2017-08-07 DIAGNOSIS — M9903 Segmental and somatic dysfunction of lumbar region: Secondary | ICD-10-CM | POA: Diagnosis not present

## 2017-08-10 DIAGNOSIS — M9905 Segmental and somatic dysfunction of pelvic region: Secondary | ICD-10-CM | POA: Diagnosis not present

## 2017-08-10 DIAGNOSIS — M9903 Segmental and somatic dysfunction of lumbar region: Secondary | ICD-10-CM | POA: Diagnosis not present

## 2017-08-10 DIAGNOSIS — M9904 Segmental and somatic dysfunction of sacral region: Secondary | ICD-10-CM | POA: Diagnosis not present

## 2017-08-10 DIAGNOSIS — M5137 Other intervertebral disc degeneration, lumbosacral region: Secondary | ICD-10-CM | POA: Diagnosis not present

## 2017-08-14 ENCOUNTER — Encounter: Payer: Self-pay | Admitting: Family Medicine

## 2017-08-14 ENCOUNTER — Ambulatory Visit (INDEPENDENT_AMBULATORY_CARE_PROVIDER_SITE_OTHER): Payer: Medicare Other | Admitting: Family Medicine

## 2017-08-14 VITALS — BP 131/83 | HR 67 | Temp 98.0°F | Ht 70.0 in | Wt 188.0 lb

## 2017-08-14 DIAGNOSIS — M9905 Segmental and somatic dysfunction of pelvic region: Secondary | ICD-10-CM | POA: Diagnosis not present

## 2017-08-14 DIAGNOSIS — A6920 Lyme disease, unspecified: Secondary | ICD-10-CM

## 2017-08-14 DIAGNOSIS — M9904 Segmental and somatic dysfunction of sacral region: Secondary | ICD-10-CM | POA: Diagnosis not present

## 2017-08-14 DIAGNOSIS — M9903 Segmental and somatic dysfunction of lumbar region: Secondary | ICD-10-CM | POA: Diagnosis not present

## 2017-08-14 DIAGNOSIS — M5137 Other intervertebral disc degeneration, lumbosacral region: Secondary | ICD-10-CM | POA: Diagnosis not present

## 2017-08-14 MED ORDER — DOXYCYCLINE HYCLATE 100 MG PO TABS
100.0000 mg | ORAL_TABLET | Freq: Two times a day (BID) | ORAL | 0 refills | Status: DC
Start: 2017-08-14 — End: 2017-09-11

## 2017-08-14 NOTE — Progress Notes (Signed)
   BP 131/83   Pulse 67   Temp 98 F (36.7 C) (Oral)   Ht 5\' 10"  (1.778 m)   Wt 188 lb (85.3 kg)   BMI 26.98 kg/m    Subjective:    Patient ID: Patrick Bell, male    DOB: Jul 12, 1940, 77 y.o.   MRN: [id number]  HPI: Patrick Bell is a 77 y.o. male presenting on 08/14/2017 for Tick Removal (on left buttock, noticing fatigue since being bitten, found tick 3 weeks ago)   HPI Tick bite Patient has had multiple tick bites recently but the one of note is on his left butt cheek that has started to become very itchy and has an area that is become red around it and he was concerned about it.  He denies any fevers or chills or myalgias.  He is mainly concerned because of the redness that was starting to develop around the bite where it sat.  He says it was a very small tick like a dog tick.  He has been more fatigued since being bitten.  Relevant past medical, surgical, family and social history reviewed and updated as indicated. Interim medical history since our last visit reviewed. Allergies and medications reviewed and updated.  Review of Systems  Constitutional: Negative for chills and fever.  Respiratory: Negative for shortness of breath and wheezing.   Cardiovascular: Negative for chest pain and leg swelling.  Musculoskeletal: Negative for back pain and gait problem.  Skin: Positive for rash. Negative for color change.  All other systems reviewed and are negative.   Per HPI unless specifically indicated above     Objective:    BP 131/83   Pulse 67   Temp 98 F (36.7 C) (Oral)   Ht 5\' 10"  (1.778 m)   Wt 188 lb (85.3 kg)   BMI 26.98 kg/m   Wt Readings from Last 3 Encounters:  08/14/17 188 lb (85.3 kg)  05/19/17 187 lb 8 oz (85 kg)  02/06/17 194 lb (88 kg)    Physical Exam  Constitutional: He is oriented to person, place, and time. He appears well-developed and well-nourished. No distress.  Eyes: Conjunctivae are normal. No scleral icterus.  Neurological: He is alert  and oriented to person, place, and time. Coordination normal.  Skin: Skin is warm and dry. Rash noted. He is not diaphoretic.     Psychiatric: He has a normal mood and affect. His behavior is normal.  Nursing note and vitals reviewed.       Assessment & Plan:   Problem List Items Addressed This Visit    None    Visit Diagnoses    Lyme disease    -  Primary   Relevant Medications   doxycycline (VIBRA-TABS) 100 MG tablet       Follow up plan: Return if symptoms worsen or fail to improve.  Counseling provided for all of the vaccine components No orders of the defined types were placed in this encounter.   Caryl Pina, MD [facility] 08/14/2017, 2:02 PM

## 2017-08-16 DIAGNOSIS — D3132 Benign neoplasm of left choroid: Secondary | ICD-10-CM | POA: Diagnosis not present

## 2017-08-16 DIAGNOSIS — H0100B Unspecified blepharitis left eye, upper and lower eyelids: Secondary | ICD-10-CM | POA: Diagnosis not present

## 2017-08-16 DIAGNOSIS — H26492 Other secondary cataract, left eye: Secondary | ICD-10-CM | POA: Diagnosis not present

## 2017-08-16 DIAGNOSIS — H1045 Other chronic allergic conjunctivitis: Secondary | ICD-10-CM | POA: Diagnosis not present

## 2017-08-16 DIAGNOSIS — H25811 Combined forms of age-related cataract, right eye: Secondary | ICD-10-CM | POA: Diagnosis not present

## 2017-08-22 DIAGNOSIS — M5137 Other intervertebral disc degeneration, lumbosacral region: Secondary | ICD-10-CM | POA: Diagnosis not present

## 2017-08-22 DIAGNOSIS — M9905 Segmental and somatic dysfunction of pelvic region: Secondary | ICD-10-CM | POA: Diagnosis not present

## 2017-08-22 DIAGNOSIS — M9903 Segmental and somatic dysfunction of lumbar region: Secondary | ICD-10-CM | POA: Diagnosis not present

## 2017-08-22 DIAGNOSIS — M9904 Segmental and somatic dysfunction of sacral region: Secondary | ICD-10-CM | POA: Diagnosis not present

## 2017-09-11 ENCOUNTER — Ambulatory Visit (INDEPENDENT_AMBULATORY_CARE_PROVIDER_SITE_OTHER): Payer: Medicare Other | Admitting: Family Medicine

## 2017-09-11 ENCOUNTER — Encounter: Payer: Self-pay | Admitting: Family Medicine

## 2017-09-11 VITALS — BP 124/75 | HR 82 | Temp 97.1°F | Ht 70.0 in | Wt 186.0 lb

## 2017-09-11 DIAGNOSIS — R2689 Other abnormalities of gait and mobility: Secondary | ICD-10-CM | POA: Diagnosis not present

## 2017-09-11 DIAGNOSIS — H66002 Acute suppurative otitis media without spontaneous rupture of ear drum, left ear: Secondary | ICD-10-CM | POA: Diagnosis not present

## 2017-09-11 DIAGNOSIS — M7042 Prepatellar bursitis, left knee: Secondary | ICD-10-CM

## 2017-09-11 MED ORDER — AMOXICILLIN 500 MG PO CAPS: 500.0000 mg | ORAL_CAPSULE | Freq: Two times a day (BID) | ORAL | 0 refills | Status: DC

## 2017-09-11 NOTE — Progress Notes (Signed)
BP 124/75   Pulse 82   Temp (!) 97.1 F (36.2 C) (Oral)   Ht 5\' 10"  (1.778 m)   Wt 186 lb (84.4 kg)   BMI 26.69 kg/m    Subjective:    Patient ID: Patrick Bell, male    DOB: 12-27-1940, 77 y.o.   MRN: [id number]  HPI  Hypertension follow-up: Patient is a 77yr old male who presents to the clinic for follow up of hypertension management. He discontinued his Amlodipine recently because he thought it was causing him to have dizziness. He has not taken the medication for the last 3-4 days, however this did not improve his symptoms. His blood pressure has remained in the 130/80's at home and today he is 124/34mmHg. He denies palpitations, HA, or acute vision changes.   Dizziness:  Patient also presents to the clinic today for 2-week history of dizziness that "comes and goes." The dizziness does not seem to be triggered by sudden head movement, going from laying to sitting or sitting to standing, etc. He reports one episode of the room spinning while he was lying down, but says it "usually feels like he is off balance." He is adequately hydrated and denies long periods working outside in the heat. Over the last month, he has had some blurring of vision that he initially attributed to the lens on his glasses being scratched. He has an appointment with ophthalmology for "scar removal" in 2 weeks. Other than discontinuing his Amlodipine, he has had no recent change in medications. Nothing seems to make it better or worse. He mentioned losing feeling in his left arm while driving the other day and having "a sensation" in his chest, but denies actual chest pain or palpitations. He denies HA, feeling faint/loss of consciousness and NVD.    Left Knee Pain: Patient reports 2-day history of left knee pain. He describes a "sharp pain" in his left knee cap that began this past Saturday. He felt the pain initially while bending down about 2 weeks ago, but says it didn't hurt anymore until this past weekend.  He indicates the area of pain directly over his left patella. He says it is very tender to touch and seems swollen. Weight-bearing and bending seem to aggravate it. He has not taken any medicines or tried any home remedies to alleviate the pain. He denies numbness or tingling.    Relevant past medical, surgical, family and social history reviewed and updated as indicated. Interim medical history since our last visit reviewed. Allergies and medications reviewed and updated.  Review of Systems  Constitutional: Negative for fatigue, fever and unexpected weight change.  HENT: Positive for postnasal drip and rhinorrhea. Negative for ear pain, sinus pressure, sore throat and tinnitus.   Eyes:       Patient reports slight blurring of vision that he initially thought may be due to scratches on the lens of his glasses. He has an appointment with ophthalmology for "scar removal" in 2 weeks.  Respiratory: Negative for cough and shortness of breath.   Cardiovascular: Negative for chest pain.       Patient describes one episode of his left arm feeling numb, accompanied by "a sensation" in his chest that he denies being chest pain.  Gastrointestinal: Negative for diarrhea, nausea and vomiting.  Musculoskeletal:       Sharp pain in left knee directly over patella that is TTP and worse with walking and bending.   Allergic/Immunologic: Positive for environmental allergies.  Neurological: Positive  for dizziness. Negative for speech difficulty, weakness, light-headedness, numbness and headaches.  Psychiatric/Behavioral: Negative for confusion and sleep disturbance.      Per HPI unless specifically indicated above   Allergies as of 09/11/2017   No Known Allergies     Medication List        Accurate as of 09/11/17  2:17 PM. Always use your most recent med list.          albuterol 108 (90 Base) MCG/ACT inhaler Commonly known as:  PROVENTIL HFA;VENTOLIN HFA Inhale 2 puffs into the lungs every 6 (six)  hours as needed for wheezing.   amLODipine 5 MG tablet Commonly known as:  NORVASC Take 1 tablet (5 mg total) daily by mouth.   amoxicillin 500 MG capsule Commonly known as:  AMOXIL Take 1 capsule (500 mg total) by mouth 2 (two) times daily.   fexofenadine-pseudoephedrine 60-120 MG 12 hr tablet Commonly known as:  ALLEGRA-D Take 1 po bid   montelukast 10 MG tablet Commonly known as:  SINGULAIR Take 1 tablet (10 mg total) daily by mouth.   Olopatadine HCl 0.2 % Soln Commonly known as:  PATADAY INSTILL 1 DROP IN EACH EYE TWICE DAILY   pantoprazole 40 MG tablet Commonly known as:  PROTONIX Take 40 mg by mouth daily.   simvastatin 10 MG tablet Commonly known as:  ZOCOR Take 1 tablet (10 mg total) at bedtime by mouth.          Objective:    BP 124/75   Pulse 82   Temp (!) 97.1 F (36.2 C) (Oral)   Ht 5\' 10"  (1.778 m)   Wt 186 lb (84.4 kg)   BMI 26.69 kg/m   Wt Readings from Last 3 Encounters:  09/11/17 186 lb (84.4 kg)  08/14/17 188 lb (85.3 kg)  05/19/17 187 lb 8 oz (85 kg)    Physical Exam  Constitutional: He is oriented to person, place, and time. He appears well-developed and well-nourished. No distress.  HENT:  Head: Normocephalic.  Right Ear: Tympanic membrane normal.  Left Ear: No tenderness. Tympanic membrane is erythematous and bulging.  Cardiovascular: Normal rate. Exam reveals no gallop and no friction rub.  No murmur heard. Pulmonary/Chest: Effort normal and breath sounds normal.  Musculoskeletal: He exhibits tenderness.       Left knee: He exhibits erythema. Tenderness found.  Erythema noted over left patella, with tenderness to palpation, tenderness with ROM. Minimal swelling noted at tibial tuberosity. Tenderness is limited to the patella, with no tenderness at the medial or lateral joint line or popliteal fossa. No bony abnormalities noted.    Neurological: He is alert and oriented to person, place, and time.  Skin: Skin is warm and dry.    Psychiatric: He has a normal mood and affect.      Assessment & Plan:  Hypertension: Patient discontinued Amlodipine 3-4 days ago without complications. His blood pressure has remained in the 130/80's at home and is 124/75 in office today.   Since he is asymptomatic and BP is stable, I agree with the patient's decision to discontinue pharmacologic therapy at this time. I have advised the patient to continue regularly monitoring his BP at home and to contact our office if he notices that it starts to creep back up.  Dizziness: Patient presents with 2 week history of dizziness with no known triggers. Discontinuing Amlodipine showed no improvement in symptoms and he has had no other recent changes in medications. I considered other causes, however, physical exam  findings reveal what appears to be left acute otitis media which is most likely the cause of the patient's dizziness.   I have prescribed Amoxicillin BID for the next 10 days. I have advised the patient to contact our office if the dizziness continues after completing his antibiotic.  Patient reported one episode of chest discomfort a few days ago. I recommended an ECG in office today, however patient declined. He has been advised to follow-up in office should he notice these symptoms again.  Knee Pain: Patient presents with 2-day history of left knee pain. Patient's history, presentation and physical exam findings are most consistent with bursitis.   I have advised the patient to ice the affected knee intermittently throughout the day and to take Advil or Ibuprofen daily to help alleviate inflammation. He has been advised to contact our office in the next couple of weeks if his symptoms worsen or do not improve. At that time, we will discuss a referral to an orthopedic surgeon.    Problem List Items Addressed This Visit    None    Visit Diagnoses    Non-recurrent acute suppurative otitis media of left ear without spontaneous rupture  of tympanic membrane    -  Primary   Relevant Medications   amoxicillin (AMOXIL) 500 MG capsule   Balance problem       Prepatellar bursitis of left knee           Follow up plan: Return in about 6 months (around 03/13/2018), or if symptoms worsen or fail to improve, for Hypertension recheck.   Darla Lesches, PA-S  Counseling provided for all of the vaccine components No orders of the defined types were placed in this encounter.  Patient seen and examined with Vita Barley, PA student, agree with assessment and plan above.  We will perform an EKG for patient but he did not want to do at this point so we will continue to monitor and if he has more symptoms he will either come back or go to the emergency department.  Likely balance problems from otitis media in the left ear so after treatment we will see if he continues to have it. Caryl Pina, MD [facility] 09/11/2017, 2:17 PM

## 2017-09-12 ENCOUNTER — Other Ambulatory Visit: Payer: Self-pay | Admitting: Family Medicine

## 2017-09-12 DIAGNOSIS — J4520 Mild intermittent asthma, uncomplicated: Secondary | ICD-10-CM

## 2017-09-25 DIAGNOSIS — H26492 Other secondary cataract, left eye: Secondary | ICD-10-CM | POA: Diagnosis not present

## 2017-09-25 DIAGNOSIS — D3132 Benign neoplasm of left choroid: Secondary | ICD-10-CM | POA: Diagnosis not present

## 2017-09-25 DIAGNOSIS — H1045 Other chronic allergic conjunctivitis: Secondary | ICD-10-CM | POA: Diagnosis not present

## 2017-09-25 DIAGNOSIS — H0100B Unspecified blepharitis left eye, upper and lower eyelids: Secondary | ICD-10-CM | POA: Diagnosis not present

## 2017-09-25 DIAGNOSIS — H25811 Combined forms of age-related cataract, right eye: Secondary | ICD-10-CM | POA: Diagnosis not present

## 2017-10-31 DIAGNOSIS — H26492 Other secondary cataract, left eye: Secondary | ICD-10-CM | POA: Diagnosis not present

## 2017-12-11 DIAGNOSIS — M9905 Segmental and somatic dysfunction of pelvic region: Secondary | ICD-10-CM | POA: Diagnosis not present

## 2017-12-11 DIAGNOSIS — M9904 Segmental and somatic dysfunction of sacral region: Secondary | ICD-10-CM | POA: Diagnosis not present

## 2017-12-11 DIAGNOSIS — M5137 Other intervertebral disc degeneration, lumbosacral region: Secondary | ICD-10-CM | POA: Diagnosis not present

## 2017-12-11 DIAGNOSIS — M9903 Segmental and somatic dysfunction of lumbar region: Secondary | ICD-10-CM | POA: Diagnosis not present

## 2017-12-12 DIAGNOSIS — M9903 Segmental and somatic dysfunction of lumbar region: Secondary | ICD-10-CM | POA: Diagnosis not present

## 2017-12-12 DIAGNOSIS — M5137 Other intervertebral disc degeneration, lumbosacral region: Secondary | ICD-10-CM | POA: Diagnosis not present

## 2017-12-12 DIAGNOSIS — M9905 Segmental and somatic dysfunction of pelvic region: Secondary | ICD-10-CM | POA: Diagnosis not present

## 2017-12-12 DIAGNOSIS — M9904 Segmental and somatic dysfunction of sacral region: Secondary | ICD-10-CM | POA: Diagnosis not present

## 2018-01-23 ENCOUNTER — Encounter: Payer: Self-pay | Admitting: *Deleted

## 2018-01-23 ENCOUNTER — Ambulatory Visit (INDEPENDENT_AMBULATORY_CARE_PROVIDER_SITE_OTHER): Payer: Medicare Other | Admitting: *Deleted

## 2018-01-23 VITALS — BP 137/82 | HR 76 | Ht 69.5 in | Wt 189.0 lb

## 2018-01-23 DIAGNOSIS — Z0000 Encounter for general adult medical examination without abnormal findings: Secondary | ICD-10-CM | POA: Diagnosis not present

## 2018-01-23 DIAGNOSIS — Z23 Encounter for immunization: Secondary | ICD-10-CM | POA: Diagnosis not present

## 2018-01-23 NOTE — Patient Instructions (Signed)
  [name] , Thank you for taking time to come for your Medicare Wellness Visit. I appreciate your ongoing commitment to your health goals. Please review the following plan we discussed and let me know if I can assist you in the future.   These are the goals we discussed: Goals    . DIET - INCREASE WATER INTAKE    . Exercise 150 minutes per week (moderate activity)       This is a list of the screening recommended for you and due dates:  Health Maintenance  Topic Date Due  . Tetanus Vaccine  04/05/2015  . Flu Shot  11/02/2017  . Pneumonia vaccines  Completed

## 2018-01-23 NOTE — Progress Notes (Addendum)
Subjective:   Patrick Bell is a 77 y.o. male who presents for a Medicare Annual Wellness Visit. Patrick Bell lives at home with his wife. He has one child, several grandchildren, and a new great grandchild.  He retired at age 3 but stays busy around his home and yard. He spent 3.5 hours yesterday cutting down trees.   Review of Systems    Patient reports that his overall health is unchanged compared to last year.  Cardiac Risk Factors include: advanced age (>26men, >77 women);dyslipidemia;male gender;sedentary lifestyle;smoking/ tobacco exposure  HEENT: cataract removed from left eye and formed scar tissue. Doing well since that was removed. Small cataract on right eye. Has some hearing loss. 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 Isn't interested in hearing aids at the moment. Also complains that over the past 6 months he's had trouble with getting strangled and coughing when he is eating or drinking. Mainly happens with liquids or small pieces of food. He had is uvula removed years ago as a sleep apnea treatment.   All other systems negative       Current Medications (verified) Outpatient Encounter Medications as of 01/23/2018  Medication Sig  . pantoprazole (PROTONIX) 40 MG tablet Take 40 mg by mouth daily.  Marland Kitchen amLODipine (NORVASC) 5 MG tablet Take 1 tablet (5 mg total) daily by mouth.  Marland Kitchen amoxicillin (AMOXIL) 500 MG capsule Take 1 capsule (500 mg total) by mouth 2 (two) times daily.  . fexofenadine-pseudoephedrine (ALLEGRA-D) 60-120 MG 12 hr tablet Take 1 po bid (Patient taking differently: Take 1 po bid as needed)  . montelukast (SINGULAIR) 10 MG tablet Take 1 tablet (10 mg total) daily by mouth.  . Olopatadine HCl (PATADAY) 0.2 % SOLN INSTILL 1 DROP IN [facility] EYE TWICE DAILY (Patient not taking: Reported on 01/23/2018)  . PROAIR HFA 108 (90 Base) MCG/ACT inhaler Inhale 2 puffs into the lungs every 6 (six) hours as needed for wheezing.  (Patient not taking: Reported on 01/23/2018)  . simvastatin (ZOCOR) 10 MG tablet Take 1 tablet (10 mg total) at bedtime by mouth.   No facility-administered encounter medications on file as of 01/23/2018.     Allergies (verified) Patient has no known allergies.   History: Past Medical History:  Diagnosis Date  . Allergy   . Asthma   . Cataract   . Cataract   . Diverticulitis   . GERD (gastroesophageal reflux disease)   . Hyperlipidemia   . Hypertension    Past Surgical History:  Procedure Laterality Date  . bilateral inguinal hernia    . COLONOSCOPY  06/09/2004   LDJ:[id number] colon diverticulosis, otherwise normal colonoscopy  . EYE SURGERY  08/2014  . HERNIA REPAIR    . PALATE / UVULA BIOPSY / EXCISION    . TONSILLECTOMY    . VASECTOMY     Family History  Problem Relation Age of Onset  . Heart disease Mother   . Hip fracture Mother 2  . Osteoporosis Mother   . Dementia Mother 44  . Arthritis Father   . Heart disease Father   . Arthritis Sister   . Arthritis Brother   . Colon cancer Neg Hx    Social History   Socioeconomic History  . Marital status: Married    Spouse name: Not on file  . Number of children: Not on file  . Years of education: Not on file  . Highest education level: Not on file  Occupational  History  . 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 Financial resource strain: Not hard at all  . Food insecurity:    Worry: Never true    Inability: Never true  . Transportation needs:    Medical: No    Non-medical: No  Tobacco Use  . Smoking status: Former Smoker    Packs/day: 4.00    Years: 25.00    Pack years: 100.00    Types: Cigarettes    Last attempt to quit: 06/23/1982    Years since quitting: 35.6  . Smokeless tobacco: Never Used  . 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 Alcohol use: Yes    Alcohol/week: 0.0 standard drinks    Comment: brandy a few  times a month, beer once a month  . Drug use: No  . Sexual activity: Yes  Lifestyle  . Physical activity:    Days per week: 0 days    Minutes per session: 0 min  . Stress: Not on file  Relationships  . Social connections:    Talks on phone: More than three times a week    Gets together: More than three times a week    Attends religious service: More than 4 times per year    Active member of club or organization: Yes    Attends meetings of clubs or organizations: More than 4 times per year    Relationship status: Married  Other Topics Concern  . Not on file  Social History Narrative  . Not on file    Tobacco Use No.  Clinical Intake:     Pain : No/denies pain     Nutritional Status: BMI of 19-24  Normal Diabetes: No     Interpreter Needed?: No     Activities of Daily Living In your present state of health, do you have any difficulty performing the following activities: 01/23/2018  Hearing? Y  Comment He does have some difficulty but is not interesting in hearing aids at this point  Vision? N  Difficulty concentrating or making decisions? N  Walking or climbing stairs? N  Dressing or bathing? N  Doing errands, shopping? N  Preparing Food and eating ? N  Using the Toilet? N  In the past six months, have you accidently leaked urine? N  Do you have problems with loss of bowel control? N  Managing your Medications? N  Managing your Finances? N  Housekeeping or managing your Housekeeping? N  Some recent data might be hidden     Diet 3 meals a day Combination of eating out and at home. Usually eats quick meals Drinks water and Coke   Exercise Current Exercise Habits: The patient does not participate in regular exercise at present(stays busy around home and yard but no formal exercise), Exercise limited by: None identified    Depression Screen PHQ 2/9 Scores 01/23/2018 09/11/2017 08/14/2017 05/19/2017  PHQ - 2 Score 0 0 0 0     Fall Risk Fall Risk   01/23/2018 09/11/2017 08/14/2017 05/19/2017 02/06/2017  Falls in the past year? No No No No Yes  Comment - - - - slipped on something and fell  Number falls in past yr: - - - - 1  Injury with Fall? - - - - Yes  Comment - - - - right elbow pain    Safety Is the patient's home free of loose throw rugs in  walkways, pet beds, electrical cords, etc?   yes      Grab bars in the bathroom? yes      Walkin shower? yes      Shower Seat? no      Handrails on the stairs?   yes      Adequate lighting?   yes  Patient Care Team: Dettinger, Fransisca Kaufmann, MD as PCP - General (Family Medicine) Danie Binder, MD as Attending Physician (Gastroenterology) Ralene Muskrat as Physician Assistant (Chiropractic Medicine) Druscilla Brownie, MD as Consulting Physician (Dermatology)  Hospitalizations, surgeries, and ER visits in previous 12 months No hospitalizations, ER visits, or surgeries this past year.   Objective:    Today's Vitals   01/23/18 0946  BP: 137/82  Pulse: 76  Weight: 189 lb (85.7 kg)  Height: 5' 9.5" (1.765 m)   Body mass index is 27.51 kg/m.  Advanced Directives 01/23/2018 01/19/2016 03/11/2015 05/01/2014 03/14/2014 03/14/2014  Does Patient Have a Medical Advance Directive? No No No Yes No No  Type of Advance Directive - - - Living will - -  Does patient want to make changes to medical advance directive? - - - No - Patient declined - -  Would patient like information on creating a medical advance directive? No - Patient declined Yes - Scientist, clinical (histocompatibility and immunogenetics) given Yes - Scientist, clinical (histocompatibility and immunogenetics) given - - No - patient declined information    Hearing/Vision  No hearing or vision deficits noted during visit.   Cognitive Function: MMSE - Mini Mental State Exam 01/23/2018 01/18/2017 01/19/2016 03/11/2015  Orientation to time 5 5 5 5   Orientation to Place 5 5 5 5   Registration 3 3 3 3   Attention/ Calculation 5 3 5 5   Recall 2 3 3 2   Language- name 2 objects 2 2 2 2   Language- repeat 1 1  1 1   Language- follow 3 step command 3 3 3 3   Language- read & follow direction 1 1 1 1   Write a sentence 1 1 1 1   Copy design 1 1 1 1   Total score 29 28 30 29        Normal Cognitive Function Screening: Yes    Immunizations and Health Maintenance Immunization History  Administered Date(s) Administered  . Influenza, High Dose Seasonal PF 01/23/2018  . Influenza,inj,Quad PF,6+ Mos 01/14/2013, 03/14/2014, 03/11/2015, 01/19/2016, 01/18/2017  . Pneumococcal Conjugate-13 03/14/2014  . Pneumococcal Polysaccharide-23 01/14/2013  . Zoster 06/14/2011   Health Maintenance Due  Topic Date Due  . TETANUS/TDAP  04/05/2015   Health Maintenance  Topic Date Due  . Samul Dada  04/05/2015  . INFLUENZA VACCINE  Completed  . PNA vac Low Risk Adult  Completed        Assessment:   This is a routine wellness examination for Patrick Bell.      Plan:    Goals    . DIET - INCREASE WATER INTAKE    . Exercise 150 minutes per week (moderate activity)        Health Maintenance Recommendations: Influenza vaccine  Additional Screening Recommendations: Lung: Low Dose CT Chest recommended if Age 13-80 years, 30 pack-year currently smoking OR have quit w/in 15years. Patient does not qualify. Hepatitis C Screening recommended: no  Today's Orders Orders Placed This Encounter  Procedures  . Flu vaccine HIGH DOSE PF  Given today  Keep f/u with Dettinger, Fransisca Kaufmann, MD and any other specialty appointments you may have Patient states that he will schedule an appointment with an ENT to evaluate strangling and  cough. He does not need a referral.  Continue current medications Move carefully to avoid falls.  Aim for at least 150 minutes of moderate activity a week. This can be chair exercises if necessary. Reading or puzzles are a good way to exercise your brain Stay connected with friends and family. Social connections are beneficial to your emotional and mental health.   I have personally  reviewed and noted the following in the patient's chart:   . Medical and social history . Use of alcohol, tobacco or illicit drugs  . Current medications and supplements . Functional ability and status . Nutritional status . Physical activity . Advanced directives . List of other physicians . Hospitalizations, surgeries, and ER visits in previous 12 months . Vitals . Screenings to include cognitive, depression, and falls . Referrals and appointments  In addition, I have reviewed and discussed with patient certain preventive protocols, quality metrics, and best practice recommendations. A written personalized care plan for preventive services as well as general preventive health recommendations were provided to patient.     Chong Sicilian, RN   01/23/2018     I have reviewed and agree with the above AWV documentation.   Evelina Dun, FNP

## 2018-02-14 DIAGNOSIS — J342 Deviated nasal septum: Secondary | ICD-10-CM | POA: Insufficient documentation

## 2018-02-14 DIAGNOSIS — K219 Gastro-esophageal reflux disease without esophagitis: Secondary | ICD-10-CM | POA: Diagnosis not present

## 2018-02-14 DIAGNOSIS — G4733 Obstructive sleep apnea (adult) (pediatric): Secondary | ICD-10-CM | POA: Diagnosis not present

## 2018-02-14 DIAGNOSIS — Z87891 Personal history of nicotine dependence: Secondary | ICD-10-CM | POA: Diagnosis not present

## 2018-02-14 DIAGNOSIS — Z7289 Other problems related to lifestyle: Secondary | ICD-10-CM | POA: Diagnosis not present

## 2018-02-14 DIAGNOSIS — Z9089 Acquired absence of other organs: Secondary | ICD-10-CM | POA: Diagnosis not present

## 2018-02-14 DIAGNOSIS — J343 Hypertrophy of nasal turbinates: Secondary | ICD-10-CM | POA: Insufficient documentation

## 2018-02-14 DIAGNOSIS — T17308A Unspecified foreign body in larynx causing other injury, initial encounter: Secondary | ICD-10-CM | POA: Insufficient documentation

## 2018-02-20 ENCOUNTER — Ambulatory Visit (INDEPENDENT_AMBULATORY_CARE_PROVIDER_SITE_OTHER): Payer: Medicare Other | Admitting: Family Medicine

## 2018-02-20 ENCOUNTER — Encounter: Payer: Self-pay | Admitting: Family Medicine

## 2018-02-20 VITALS — BP 135/76 | HR 71 | Temp 96.9°F | Ht 69.0 in | Wt 190.0 lb

## 2018-02-20 DIAGNOSIS — K5792 Diverticulitis of intestine, part unspecified, without perforation or abscess without bleeding: Secondary | ICD-10-CM | POA: Diagnosis not present

## 2018-02-20 MED ORDER — AMOXICILLIN-POT CLAVULANATE 875-125 MG PO TABS: 1.0000 | ORAL_TABLET | Freq: Two times a day (BID) | ORAL | 0 refills | Status: AC

## 2018-02-20 MED ORDER — METRONIDAZOLE 500 MG PO TABS: 500.0000 mg | ORAL_TABLET | Freq: Three times a day (TID) | ORAL | 0 refills | Status: AC

## 2018-02-20 NOTE — Patient Instructions (Signed)
Diverticulitis °Diverticulitis is infection or inflammation of small pouches (diverticula) in the colon that form due to a condition called diverticulosis. Diverticula can trap stool (feces) and bacteria, causing infection and inflammation. °Diverticulitis may cause severe stomach pain and diarrhea. It may lead to tissue damage in the colon that causes bleeding. The diverticula may also burst (rupture) and cause infected stool to enter other areas of the abdomen. °Complications of diverticulitis can include: °· Bleeding. °· Severe infection. °· Severe pain. °· Rupture (perforation) of the colon. °· Blockage (obstruction) of the colon. ° °What are the causes? °This condition is caused by stool becoming trapped in the diverticula, which allows bacteria to grow in the diverticula. This leads to inflammation and infection. °What increases the risk? °You are more likely to develop this condition if: °· You have diverticulosis. The risk for diverticulosis increases if: °? You are overweight or obese. °? You use tobacco products. °? You do not get enough exercise. °· You eat a diet that does not include enough fiber. High-fiber foods include fruits, vegetables, beans, nuts, and whole grains. ° °What are the signs or symptoms? °Symptoms of this condition may include: °· Pain and tenderness in the abdomen. The pain is normally located on the left side of the abdomen, but it may occur in other areas. °· Fever and chills. °· Bloating. °· Cramping. °· Nausea. °· Vomiting. °· Changes in bowel routines. °· Blood in your stool. ° °How is this diagnosed? °This condition is diagnosed based on: °· Your medical history. °· A physical exam. °· Tests to make sure there is nothing else causing your condition. These tests may include: °? Blood tests. °? Urine tests. °? Imaging tests of the abdomen, including X-rays, ultrasounds, MRIs, or CT scans. ° °How is this treated? °Most cases of this condition are mild and can be treated at home.  Treatment may include: °· Taking over-the-counter pain medicines. °· Following a clear liquid diet. °· Taking antibiotic medicines by mouth. °· Rest. ° °More severe cases may need to be treated at a hospital. Treatment may include: °· Not eating or drinking. °· Taking prescription pain medicine. °· Receiving antibiotic medicines through an IV tube. °· Receiving fluids and nutrition through an IV tube. °· Surgery. ° °When your condition is under control, your health care provider may recommend that you have a colonoscopy. This is an exam to look at the entire large intestine. During the exam, a lubricated, bendable tube is inserted into the anus and then passed into the rectum, colon, and other parts of the large intestine. A colonoscopy can show how severe your diverticula are and whether something else may be causing your symptoms. °Follow these instructions at home: °Medicines °· Take over-the-counter and prescription medicines only as told by your health care provider. These include fiber supplements, probiotics, and stool softeners. °· If you were prescribed an antibiotic medicine, take it as told by your health care provider. Do not stop taking the antibiotic even if you start to feel better. °· Do not drive or use heavy machinery while taking prescription pain medicine. °General instructions °· Follow a full liquid diet or another diet as directed by your health care provider. After your symptoms improve, your health care provider may tell you to change your diet. He or she may recommend that you eat a diet that contains at least 25 g (25 grams) of fiber daily. Fiber makes it easier to pass stool. Healthy sources of fiber include: °? Berries. One cup   contains 4-8 grams of fiber. °? Beans or lentils. One half cup contains 5-8 grams of fiber. °? Green vegetables. One cup contains 4 grams of fiber. °· Exercise for at least 30 minutes, 3 times each week. You should exercise hard enough to raise your heart rate and  break a sweat. °· Keep all follow-up visits as told by your health care provider. This is important. You may need a colonoscopy. °Contact a health care provider if: °· Your pain does not improve. °· You have a hard time drinking or eating food. °· Your bowel movements do not return to normal. °Get help right away if: °· Your pain gets worse. °· Your symptoms do not get better with treatment. °· Your symptoms suddenly get worse. °· You have a fever. °· You vomit more than one time. °· You have stools that are bloody, black, or tarry. °Summary °· Diverticulitis is infection or inflammation of small pouches (diverticula) in the colon that form due to a condition called diverticulosis. Diverticula can trap stool (feces) and bacteria, causing infection and inflammation. °· You are at higher risk for this condition if you have diverticulosis and you eat a diet that does not include enough fiber. °· Most cases of this condition are mild and can be treated at home. More severe cases may need to be treated at a hospital. °· When your condition is under control, your health care provider may recommend that you have an exam called a colonoscopy. This exam can show how severe your diverticula are and whether something else may be causing your symptoms. °This information is not intended to replace advice given to you by your health care provider. Make sure you discuss any questions you have with your health care provider. °Document Released: 12/29/2004 Document Revised: 04/23/2016 Document Reviewed: 04/23/2016 °Elsevier Interactive Patient Education © 2018 [employer] ° °

## 2018-02-20 NOTE — Progress Notes (Signed)
Subjective: CC: Abdominal pain PCP: Dettinger, Fransisca Kaufmann, MD Patrick Bell is a 77 y.o. male presenting to clinic today for:  1.  Abdominal pain Patient reports a 2-day history of moderate abdominal pain in the left lower quadrant.  He notes that the abdominal pain was preceded by several days of diarrhea.  The diarrhea is nonbloody.  He denies any associated fevers.  He has had intermittent nausea over the last couple of weeks without vomiting.  Denies any consumption of undercooked food, foods left out too long or untreated water but does state that he had eaten a meal that have been sitting in the refrigerator for about 6 days.  No other sick contacts.   ROS: Per HPI  No Known Allergies Past Medical History:  Diagnosis Date  . Allergy   . Asthma   . Cataract   . Cataract   . Diverticulitis   . GERD (gastroesophageal reflux disease)   . Hyperlipidemia   . Hypertension     Current Outpatient Medications:  .  fexofenadine-pseudoephedrine (ALLEGRA-D) 60-120 MG 12 hr tablet, Take 1 po bid (Patient taking differently: Take 1 po bid as needed), Disp: 60 tablet, Rfl: 6 .  montelukast (SINGULAIR) 10 MG tablet, Take 1 tablet (10 mg total) daily by mouth., Disp: 90 tablet, Rfl: 3 .  Olopatadine HCl (PATADAY) 0.2 % SOLN, INSTILL 1 DROP IN EACH EYE TWICE DAILY, Disp: 2.5 mL, Rfl: 1 .  pantoprazole (PROTONIX) 40 MG tablet, Take 40 mg by mouth daily., Disp: , Rfl:  .  PROAIR HFA 108 (90 Base) MCG/ACT inhaler, Inhale 2 puffs into the lungs every 6 (six) hours as needed for wheezing., Disp: 8.5 g, Rfl: 2 .  simvastatin (ZOCOR) 10 MG tablet, Take 1 tablet (10 mg total) at bedtime by mouth., Disp: 90 tablet, Rfl: 3 Social History   Socioeconomic History  . Marital status: Married    Spouse name: Not on file  . Number of children: Not on file  . Years of education: Not on file  . Highest education level: Not on file  Occupational History  . Occupation: retired    [employer] farm manager: [employer]    Comment: truck driving  Social Needs  . Financial resource strain: Not hard at all  . Food insecurity:    Worry: Never true    Inability: Never true  . Transportation needs:    Medical: No    Non-medical: No  Tobacco Use  . Smoking status: Former Smoker    Packs/day: 4.00    Years: 25.00    Pack years: 100.00    Types: Cigarettes    Last attempt to quit: 06/23/1982    Years since quitting: 35.6  . Smokeless tobacco: Never Used  . 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 Alcohol use: Yes    Alcohol/week: 0.0 standard drinks    Comment: brandy a few times a month, beer once a month  . Drug use: No  . Sexual activity: Yes  Lifestyle  . Physical activity:    Days per week: 0 days    Minutes per session: 0 min  . Stress: Not on file  Relationships  . Social connections:    Talks on phone: More than three times a week    Gets together: More than three times a week    Attends religious service: More than 4 times per year    Active member of club or organization: Yes  Attends meetings of clubs or organizations: More than 4 times per year    Relationship status: Married  . Intimate partner violence:    Fear of current or ex partner: No    Emotionally abused: No    Physically abused: No    Forced sexual activity: No  Other Topics Concern  . Not on file  Social History Narrative  . Not on file   Family History  Problem Relation Age of Onset  . Heart disease Mother   . Hip fracture Mother 81  . Osteoporosis Mother   . Dementia Mother 58  . Arthritis Father   . Heart disease Father   . Arthritis Sister   . Arthritis Brother   . Colon cancer Neg Hx     Objective: Office vital signs reviewed. BP 135/76   Pulse 71   Temp (!) 96.9 F (36.1 C) (Oral)   Ht 5\' 9"  (1.753 m)   Wt 190 lb (86.2 kg)   BMI 28.06 kg/m   Physical Examination:  General: Awake, alert, well nourished, nontoxic. No acute distress HEENT: normal,  sclera white, MMM GI: soft,+TTP in the LLQ. non-distended, bowel sounds hyperactive and present x4, no hepatomegaly, no splenomegaly, no masses   Assessment/ Plan: 77 y.o. male   1. Diverticulitis Patient is afebrile nontoxic-appearing.  His physical exam significant for tenderness to palpation left lower quadrant.  I reviewed his CAT scan from 2018, which did demonstrate several diverticula in the descending and sigmoid colon.  I have a high suspicion for diverticulitis in this patient.  I have placed him on 7-day course of Augmentin and Flagyl.  Home care instructions were reviewed.  I would like him to attempt to clear liquid diet for the next couple of days.  May add food as tolerated.  We discussed reasons for return and a handout was provided.  He will follow-up in 1 week for recheck, sooner if needed.   No orders of the defined types were placed in this encounter.  Meds ordered this encounter  Medications  . amoxicillin-clavulanate (AUGMENTIN) 875-125 MG tablet    Sig: Take 1 tablet by mouth 2 (two) times daily for 7 days.    Dispense:  14 tablet    Refill:  0  . metroNIDAZOLE (FLAGYL) 500 MG tablet    Sig: Take 1 tablet (500 mg total) by mouth 3 (three) times daily for 7 days.    Dispense:  21 tablet    Refill:  [place], DO [place] [phone]

## 2018-02-27 DIAGNOSIS — M9903 Segmental and somatic dysfunction of lumbar region: Secondary | ICD-10-CM | POA: Diagnosis not present

## 2018-02-27 DIAGNOSIS — M9905 Segmental and somatic dysfunction of pelvic region: Secondary | ICD-10-CM | POA: Diagnosis not present

## 2018-02-27 DIAGNOSIS — M9904 Segmental and somatic dysfunction of sacral region: Secondary | ICD-10-CM | POA: Diagnosis not present

## 2018-02-27 DIAGNOSIS — M5137 Other intervertebral disc degeneration, lumbosacral region: Secondary | ICD-10-CM | POA: Diagnosis not present

## 2018-02-28 DIAGNOSIS — M9905 Segmental and somatic dysfunction of pelvic region: Secondary | ICD-10-CM | POA: Diagnosis not present

## 2018-02-28 DIAGNOSIS — M5137 Other intervertebral disc degeneration, lumbosacral region: Secondary | ICD-10-CM | POA: Diagnosis not present

## 2018-02-28 DIAGNOSIS — M9903 Segmental and somatic dysfunction of lumbar region: Secondary | ICD-10-CM | POA: Diagnosis not present

## 2018-02-28 DIAGNOSIS — M9904 Segmental and somatic dysfunction of sacral region: Secondary | ICD-10-CM | POA: Diagnosis not present

## 2018-03-02 ENCOUNTER — Ambulatory Visit (INDEPENDENT_AMBULATORY_CARE_PROVIDER_SITE_OTHER): Payer: Medicare Other | Admitting: Nurse Practitioner

## 2018-03-02 ENCOUNTER — Ambulatory Visit (HOSPITAL_COMMUNITY)
Admission: RE | Admit: 2018-03-02 | Discharge: 2018-03-02 | Disposition: A | Discharge status: Home / Self Care | Payer: Medicare Other | Source: Ambulatory Visit | Attending: Nurse Practitioner | Admitting: Nurse Practitioner

## 2018-03-02 ENCOUNTER — Encounter: Payer: Self-pay | Admitting: Nurse Practitioner

## 2018-03-02 DIAGNOSIS — K5730 Diverticulosis of large intestine without perforation or abscess without bleeding: Secondary | ICD-10-CM | POA: Diagnosis not present

## 2018-03-02 DIAGNOSIS — R1032 Left lower quadrant pain: Secondary | ICD-10-CM

## 2018-03-02 LAB — POCT I-STAT CREATININE: Creatinine, Ser: 0.7 mg/dL (ref 0.61–1.24)

## 2018-03-02 MED ORDER — SODIUM CHLORIDE (PF) 0.9 % IJ SOLN
INTRAMUSCULAR | Status: AC
  Filled 2018-03-02: qty 50

## 2018-03-02 MED ORDER — IOPAMIDOL (ISOVUE-300) INJECTION 61%
INTRAVENOUS | Status: AC
  Filled 2018-03-02: qty 100

## 2018-03-02 MED ORDER — IOPAMIDOL (ISOVUE-300) INJECTION 61%
100.0000 mL | Freq: Once | INTRAVENOUS | Status: AC | PRN
  Administered 2018-03-02: 100 mL via INTRAVENOUS

## 2018-03-02 NOTE — Addendum Note (Signed)
Addended by: Rolena Infante on: 03/02/2018 02:54 PM   Modules accepted: Orders

## 2018-03-02 NOTE — Patient Instructions (Signed)

## 2018-03-02 NOTE — Progress Notes (Signed)
   Subjective:    Patient ID: Patrick Bell, male    DOB: 1940/08/02, 76 y.o.   MRN: [id number]   Chief Complaint: Flank Pain and Back Pain   HPI Patient come sin today c/o left flank pain. He was seen by DR. Gottschaulk 02/20/18 with left lower quadrant following a bout with diarrhea. He was dx with diverticulitis. He was placed on 7 day course of augmentin and flagyl and was told to watch diet. He has continued to have nausea and abdominal pain. His pain has moved around to lower back. His weight is down 7 lbs since last visit.    Review of Systems  Constitutional: Positive for appetite change (decreased). Negative for chills and fever.  Respiratory: Negative.   Cardiovascular: Negative.   Gastrointestinal: Positive for abdominal pain, constipation, diarrhea and nausea. Negative for blood in stool.  Genitourinary: Negative.   Neurological: Negative.   Psychiatric/Behavioral: Negative.   All other systems reviewed and are negative.      Objective:   Physical Exam  Constitutional: He is oriented to person, place, and time. He appears well-developed and well-nourished. He appears distressed (mild).  Cardiovascular: Normal rate and regular rhythm.  Pulmonary/Chest: Effort normal.  Abdominal: Soft. Bowel sounds are normal. He exhibits no distension and no mass. There is tenderness (bil lower quadrant. worse on left then right). There is guarding (mild). There is no rebound.  Neurological: He is alert and oriented to person, place, and time.  Skin: Skin is warm.  Psychiatric: He has a normal mood and affect. His behavior is normal. Judgment and thought content normal.   BP 119/73   Pulse 67   Temp (!) 96.8 F (36 C) (Oral)   Ht 5\' 9"  (1.753 m)   Wt 184 lb (83.5 kg)   BMI 27.17 kg/m         Assessment & Plan:  Patrick Bell in today with chief complaint of Flank Pain and Back Pain   1. Left lower quadrant abdominal pain NPO until test complete - CT Abdomen Pelvis W  Contrast; Future  [name], FNP

## 2018-03-02 NOTE — Addendum Note (Signed)
Addended by: Rolena Infante on: 03/02/2018 03:34 PM   Modules accepted: Orders

## 2018-03-07 DIAGNOSIS — M9903 Segmental and somatic dysfunction of lumbar region: Secondary | ICD-10-CM | POA: Diagnosis not present

## 2018-03-07 DIAGNOSIS — M9904 Segmental and somatic dysfunction of sacral region: Secondary | ICD-10-CM | POA: Diagnosis not present

## 2018-03-07 DIAGNOSIS — M9905 Segmental and somatic dysfunction of pelvic region: Secondary | ICD-10-CM | POA: Diagnosis not present

## 2018-03-07 DIAGNOSIS — M5137 Other intervertebral disc degeneration, lumbosacral region: Secondary | ICD-10-CM | POA: Diagnosis not present

## 2018-03-08 DIAGNOSIS — M9903 Segmental and somatic dysfunction of lumbar region: Secondary | ICD-10-CM | POA: Diagnosis not present

## 2018-03-08 DIAGNOSIS — M9904 Segmental and somatic dysfunction of sacral region: Secondary | ICD-10-CM | POA: Diagnosis not present

## 2018-03-08 DIAGNOSIS — M5137 Other intervertebral disc degeneration, lumbosacral region: Secondary | ICD-10-CM | POA: Diagnosis not present

## 2018-03-08 DIAGNOSIS — M9905 Segmental and somatic dysfunction of pelvic region: Secondary | ICD-10-CM | POA: Diagnosis not present

## 2018-03-12 ENCOUNTER — Ambulatory Visit (INDEPENDENT_AMBULATORY_CARE_PROVIDER_SITE_OTHER): Payer: Medicare Other

## 2018-03-12 ENCOUNTER — Ambulatory Visit (INDEPENDENT_AMBULATORY_CARE_PROVIDER_SITE_OTHER): Payer: Medicare Other | Admitting: Family Medicine

## 2018-03-12 ENCOUNTER — Encounter: Payer: Self-pay | Admitting: Family Medicine

## 2018-03-12 VITALS — BP 137/80 | HR 86 | Temp 97.0°F | Ht 69.0 in | Wt 178.0 lb

## 2018-03-12 DIAGNOSIS — R112 Nausea with vomiting, unspecified: Secondary | ICD-10-CM | POA: Diagnosis not present

## 2018-03-12 DIAGNOSIS — R634 Abnormal weight loss: Secondary | ICD-10-CM

## 2018-03-12 DIAGNOSIS — K5900 Constipation, unspecified: Secondary | ICD-10-CM

## 2018-03-12 MED ORDER — ONDANSETRON 4 MG PO TBDP: 4.0000 mg | ORAL_TABLET | Freq: Three times a day (TID) | ORAL | 0 refills | Status: DC | PRN

## 2018-03-12 NOTE — Progress Notes (Signed)
   Subjective:    Patient ID: Patrick Bell, male    DOB: 08/16/1940, 77 y.o.   MRN: [id number]  Chief Complaint:  Nausea, vomiting, acid reflux, pain in groin, back pain, no    HPI: Patrick Bell is a 77 y.o. male presenting on 03/12/2018 for Nausea, vomiting, acid reflux, pain in groin, back pain, no   Pt presents today with complaints of nausea, vomiting, continued abdominal pain, constipation, weight loss, and decreased appetite. Pt was seen in clinic on 02/20/18 and placed on antibiotics for diverticulitis. Pt was seen again in clinic on 03/02/18 for continued LLQ pain. A CT scan of the abdomen and pelvis was completed which revealed diverticulosis without diverticulitis, no other acute findings. Pt states he continues to have pain, is unable to hold food down, and can only hold down some liquids. Has had around a 12 lb weight loss. Denies fever, chills, or night sweats. States he does have some fatigue. States last BM was 3 days ago.   Relevant past medical, surgical, family, and social history reviewed and updated as indicated.  Allergies and medications reviewed and updated.   Past Medical History:  Diagnosis Date  . Allergy   . Asthma   . Cataract   . Cataract   . Diverticulitis   . GERD (gastroesophageal reflux disease)   . Hyperlipidemia   . Hypertension     Past Surgical History:  Procedure Laterality Date  . bilateral inguinal hernia    . COLONOSCOPY  06/09/2004   NUR:Sigmoid colon diverticulosis, otherwise normal colonoscopy  . EYE SURGERY  08/2014  . HERNIA REPAIR    . PALATE / UVULA BIOPSY / EXCISION    . TONSILLECTOMY    . VASECTOMY      Social History   Socioeconomic History  . Marital status: Married    Spouse name: Not on file  . Number of children: Not on file  . Years of education: Not on file  . Highest education level: Not on file  Occupational History  . Occupation: retired    Employer: [employer]    Comment: truck driving  Social  Needs  . Financial resource strain: Not hard at all  . Food insecurity:    Worry: Never true    Inability: Never true  . Transportation needs:    Medical: No    Non-medical: No  Tobacco Use  . Smoking status: Former Smoker    Packs/day: 4.00    Years: 25.00    Pack years: 100.00    Types: Cigarettes    Last attempt to quit: 06/23/1982    Years since quitting: 35.7  . Smokeless tobacco: Never Used  . 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 Alcohol use: Yes    Alcohol/week: 0.0 standard drinks    Comment: brandy a few times a month, beer once a month  . Drug use: No  . Sexual activity: Yes  Lifestyle  . Physical activity:    Days per week: 0 days    Minutes per session: 0 min  . Stress: Not on file  Relationships  . Social connections:    Talks on phone: More than three times a week    Gets together: More than three times a week    Attends religious service: More than 4 times per year    Active member of club or organization: Yes    Attends meetings of clubs or organizations: More than   4 times per year    Relationship status: Married  . Intimate partner violence:    Fear of current or ex partner: No    Emotionally abused: No    Physically abused: No    Forced sexual activity: No  Other Topics Concern  . Not on file  Social History Narrative  . Not on file    Outpatient Encounter Medications as of 03/12/2018  Medication Sig  . fexofenadine-pseudoephedrine (ALLEGRA-D) 60-120 MG 12 hr tablet Take 1 po bid (Patient taking differently: Take 1 po bid as needed)  . montelukast (SINGULAIR) 10 MG tablet Take 1 tablet (10 mg total) daily by mouth.  . Olopatadine HCl (PATADAY) 0.2 % SOLN INSTILL 1 DROP IN EACH EYE TWICE DAILY  . pantoprazole (PROTONIX) 40 MG tablet Take 40 mg by mouth daily.  . PROAIR HFA 108 (90 Base) MCG/ACT inhaler Inhale 2 puffs into the lungs every 6 (six) hours as needed for wheezing.  . simvastatin (ZOCOR) 10 MG tablet  Take 1 tablet (10 mg total) at bedtime by mouth.  . ondansetron (ZOFRAN ODT) 4 MG disintegrating tablet Take 1 tablet (4 mg total) by mouth every 8 (eight) hours as needed for nausea or vomiting.   No facility-administered encounter medications on file as of 03/12/2018.     No Known Allergies  Review of Systems  Constitutional: Positive for appetite change, fatigue and unexpected weight change. Negative for activity change, chills and fever.  Respiratory: Negative for cough, chest tightness and shortness of breath.   Cardiovascular: Negative for chest pain and palpitations.  Gastrointestinal: Positive for abdominal pain, constipation, nausea and vomiting. Negative for abdominal distention, anal bleeding, blood in stool, diarrhea and rectal pain.  Genitourinary: Negative for decreased urine volume and difficulty urinating.  Musculoskeletal: Negative for arthralgias and myalgias.  Neurological: Positive for weakness. Negative for dizziness, light-headedness and headaches.  Psychiatric/Behavioral: Negative for confusion.  All other systems reviewed and are negative.       Objective:    BP 137/80   Pulse 86   Temp (!) 97 F (36.1 C) (Oral)   Ht 5' 9" (1.753 m)   Wt 178 lb (80.7 kg)   BMI 26.29 kg/m    Wt Readings from Last 3 Encounters:  03/12/18 178 lb (80.7 kg)  03/02/18 184 lb (83.5 kg)  02/20/18 190 lb (86.2 kg)    Physical Exam  Constitutional: He is oriented to person, place, and time. He appears well-developed. He appears cachectic. He is cooperative. He appears distressed (mild).  HENT:  Head: Normocephalic and atraumatic.  Mouth/Throat: Uvula is midline, oropharynx is clear and moist and mucous membranes are normal. No tonsillar exudate.  Neck: Trachea normal and phonation normal. No JVD present.  Cardiovascular: Normal rate, regular rhythm, S1 normal, S2 normal and normal heart sounds. Exam reveals no gallop and no friction rub.  No murmur heard. Pulmonary/Chest:  Effort normal and breath sounds normal.  Abdominal: Soft. Normal appearance and bowel sounds are normal. There is no hepatosplenomegaly. There is tenderness in the epigastric area and left lower quadrant. No hernia.  Neurological: He is alert and oriented to person, place, and time.  Skin: Skin is warm and dry. Capillary refill takes less than 2 seconds. There is pallor.  Psychiatric: He has a normal mood and affect. His speech is normal and behavior is normal. Judgment and thought content normal. Cognition and memory are normal.    Results for orders placed or performed during the hospital encounter of 03/02/18    I-STAT creatinine  Result Value Ref Range   Creatinine, Ser 0.70 0.61 - 1.24 mg/dL     KUB without acute findings per radiology reading.   Pertinent labs & imaging results that were available during my care of the patient were reviewed by me and considered in my medical decision making.  Assessment & Plan:  Patrick Bell was seen today for nausea, vomiting, acid reflux, pain in groin, back pain, no .  Diagnoses and all orders for this visit:  Constipation, unspecified constipation type Increase fluid intake. Miralax. Increase fiber intake. Report any new or worsening symptoms. -     DG Abd 1 View; Future -     Ambulatory referral to Gastroenterology  Nausea and vomiting in adult Strong diet - BRAT diet. Take zofran and wait at least 20 minutes before eating or drinking.  -     ondansetron (ZOFRAN ODT) 4 MG disintegating tablet; Take 1 tablet (4 mg total) by mouth every 8 (eight) hours as needed for nausea or vomiting. -     Ambulatory referral to Gastroenterology -     CBC with Differential/Platelet -     CMP14+EGFR  Weight loss -     CBC with Differential/Platelet -     CMP14+EGFR -     TSH  Labs pending. Due to ongoing symptoms will refer urgently to GI for possible endoscopy and colonoscopy. Medications as prescribed. Pt aware of signs and symptoms that require emergent  evaluation.   Continue all other maintenance medications.  Follow up plan: Return in about 2 weeks (around 03/26/2018), or if symptoms worsen or fail to improve.  Educational handout given for constipation   The above assessment and management plan was discussed with the patient. The patient verbalized understanding of and has agreed to the management plan. Patient is aware to call the clinic if symptoms persist or worsen. Patient is aware when to return to the clinic for a follow-up visit. Patient educated on when it is appropriate to go to the emergency department.   Monia Pouch, FNP-C [facility] [phone]

## 2018-03-12 NOTE — Patient Instructions (Signed)
Thank you for coming in to clinic today.  1. Your symptoms are consistent with Constipation, likely cause of your General Abdominal Pain / Cramping. 2. Start with Miralax, prescription was sent to pharmacy. First dose 68g (4 capfuls) in 32oz water over 1 to 2 hours for clean out. Next day start 17g or 1 capful daily, may adjust dose up or down by half a capful every few days. Recommend to take this medicine daily for next 1-2 weeks, you may need to use it longer if needed. - Goal is to have soft regular bowel movement 1-3x daily, if too runny or diarrhea, then reduce dose of the medicine to every other day.  Improve water intake, hydration will help Also recommend increased vegetables, fruits, fiber intake Can try daily Metamucil or Fiber supplement at pharmacy over the counter  Follow-up if symptoms are not improving with bowel movements, or if pain worsens, develop fevers, nausea, vomiting.  Please schedule a follow-up appointment with Michelle Virdell Hoiland, FNP, in 1 month to follow-up Constipation  If you have any other questions or concerns, please feel free to call the clinic to contact me. You may also schedule an earlier appointment if necessary.  However, if your symptoms get significantly worse, please go to the Emergency Department to seek immediate medical attention.  

## 2018-03-13 DIAGNOSIS — M9904 Segmental and somatic dysfunction of sacral region: Secondary | ICD-10-CM | POA: Diagnosis not present

## 2018-03-13 DIAGNOSIS — M5137 Other intervertebral disc degeneration, lumbosacral region: Secondary | ICD-10-CM | POA: Diagnosis not present

## 2018-03-13 DIAGNOSIS — E785 Hyperlipidemia, unspecified: Secondary | ICD-10-CM | POA: Diagnosis not present

## 2018-03-13 DIAGNOSIS — K5732 Diverticulitis of large intestine without perforation or abscess without bleeding: Secondary | ICD-10-CM | POA: Diagnosis not present

## 2018-03-13 DIAGNOSIS — Z79899 Other long term (current) drug therapy: Secondary | ICD-10-CM | POA: Diagnosis not present

## 2018-03-13 DIAGNOSIS — M9905 Segmental and somatic dysfunction of pelvic region: Secondary | ICD-10-CM | POA: Diagnosis not present

## 2018-03-13 DIAGNOSIS — N281 Cyst of kidney, acquired: Secondary | ICD-10-CM | POA: Diagnosis not present

## 2018-03-13 DIAGNOSIS — Z7951 Long term (current) use of inhaled steroids: Secondary | ICD-10-CM | POA: Diagnosis not present

## 2018-03-13 DIAGNOSIS — K219 Gastro-esophageal reflux disease without esophagitis: Secondary | ICD-10-CM | POA: Diagnosis not present

## 2018-03-13 DIAGNOSIS — M9903 Segmental and somatic dysfunction of lumbar region: Secondary | ICD-10-CM | POA: Diagnosis not present

## 2018-03-13 DIAGNOSIS — K5900 Constipation, unspecified: Secondary | ICD-10-CM | POA: Diagnosis not present

## 2018-03-13 DIAGNOSIS — K5730 Diverticulosis of large intestine without perforation or abscess without bleeding: Secondary | ICD-10-CM | POA: Diagnosis not present

## 2018-03-13 DIAGNOSIS — I10 Essential (primary) hypertension: Secondary | ICD-10-CM | POA: Diagnosis not present

## 2018-03-13 DIAGNOSIS — R109 Unspecified abdominal pain: Secondary | ICD-10-CM | POA: Diagnosis not present

## 2018-03-13 LAB — CMP14+EGFR
ALK PHOS: 60 IU/L (ref 39–117)
ALT: 10 IU/L (ref 0–44)
AST: 19 IU/L (ref 0–40)
Albumin/Globulin Ratio: 2.1 (ref 1.2–2.2)
Albumin: 4.5 g/dL (ref 3.5–4.8)
BUN/Creatinine Ratio: 17 (ref 10–24)
BUN: 18 mg/dL (ref 8–27)
Bilirubin Total: 1 mg/dL (ref 0.0–1.2)
CHLORIDE: 100 mmol/L (ref 96–106)
CO2: 22 mmol/L (ref 20–29)
Calcium: 9.8 mg/dL (ref 8.6–10.2)
Creatinine, Ser: 1.05 mg/dL (ref 0.76–1.27)
GFR calc Af Amer: 78 mL/min/{1.73_m2} (ref >59)
GFR calc non Af Amer: 68 mL/min/{1.73_m2} (ref >59)
Globulin, Total: 2.1 g/dL (ref 1.5–4.5)
Glucose: 96 mg/dL (ref 65–99)
Potassium: 4.5 mmol/L (ref 3.5–5.2)
Sodium: 142 mmol/L (ref 134–144)
Total Protein: 6.6 g/dL (ref 6.0–8.5)

## 2018-03-13 LAB — CBC WITH DIFFERENTIAL/PLATELET
BASOS ABS: 0.1 10*3/uL (ref 0.0–0.2)
Basos: 1 %
EOS (ABSOLUTE): 0 10*3/uL (ref 0.0–0.4)
Eos: 0 %
Hematocrit: 51 % (ref 37.5–51.0)
Hemoglobin: 16.9 g/dL (ref 13.0–17.7)
Immature Grans (Abs): 0 10*3/uL (ref 0.0–0.1)
Immature Granulocytes: 0 %
Lymphocytes Absolute: 1 10*3/uL (ref 0.7–3.1)
Lymphs: 13 %
MCH: 28.8 pg (ref 26.6–33.0)
MCHC: 33.1 g/dL (ref 31.5–35.7)
MCV: 87 fL (ref 79–97)
Monocytes Absolute: 0.6 10*3/uL (ref 0.1–0.9)
Monocytes: 8 %
Neutrophils Absolute: 6.1 10*3/uL (ref 1.4–7.0)
Neutrophils: 78 %
Platelets: 293 10*3/uL (ref 150–450)
RBC: 5.86 x10E6/uL — ABNORMAL HIGH (ref 4.14–5.80)
RDW: 12.9 % (ref 12.3–15.4)
WBC: 7.9 10*3/uL (ref 3.4–10.8)

## 2018-03-13 LAB — TSH: TSH: 4.06 u[IU]/mL (ref 0.450–4.500)

## 2018-03-14 ENCOUNTER — Ambulatory Visit: Payer: Medicare Other | Admitting: Family Medicine

## 2018-03-14 MED ORDER — SODIUM CHLORIDE 0.9 % IV SOLN
10.00 | INTRAVENOUS | Status: DC
Start: ? — End: 2018-03-14

## 2018-03-14 MED ORDER — GENERIC EXTERNAL MEDICATION
10.00 | Status: DC
Start: ? — End: 2018-03-14

## 2018-03-16 DIAGNOSIS — K5790 Diverticulosis of intestine, part unspecified, without perforation or abscess without bleeding: Secondary | ICD-10-CM | POA: Diagnosis not present

## 2018-03-16 DIAGNOSIS — R1032 Left lower quadrant pain: Secondary | ICD-10-CM | POA: Diagnosis not present

## 2018-03-20 DIAGNOSIS — M5137 Other intervertebral disc degeneration, lumbosacral region: Secondary | ICD-10-CM | POA: Diagnosis not present

## 2018-03-20 DIAGNOSIS — M9905 Segmental and somatic dysfunction of pelvic region: Secondary | ICD-10-CM | POA: Diagnosis not present

## 2018-03-20 DIAGNOSIS — M9904 Segmental and somatic dysfunction of sacral region: Secondary | ICD-10-CM | POA: Diagnosis not present

## 2018-03-20 DIAGNOSIS — M9903 Segmental and somatic dysfunction of lumbar region: Secondary | ICD-10-CM | POA: Diagnosis not present

## 2018-03-22 DIAGNOSIS — M9903 Segmental and somatic dysfunction of lumbar region: Secondary | ICD-10-CM | POA: Diagnosis not present

## 2018-03-22 DIAGNOSIS — M5137 Other intervertebral disc degeneration, lumbosacral region: Secondary | ICD-10-CM | POA: Diagnosis not present

## 2018-03-22 DIAGNOSIS — M9904 Segmental and somatic dysfunction of sacral region: Secondary | ICD-10-CM | POA: Diagnosis not present

## 2018-03-22 DIAGNOSIS — M9905 Segmental and somatic dysfunction of pelvic region: Secondary | ICD-10-CM | POA: Diagnosis not present

## 2018-03-26 DIAGNOSIS — M9903 Segmental and somatic dysfunction of lumbar region: Secondary | ICD-10-CM | POA: Diagnosis not present

## 2018-03-26 DIAGNOSIS — M5137 Other intervertebral disc degeneration, lumbosacral region: Secondary | ICD-10-CM | POA: Diagnosis not present

## 2018-03-26 DIAGNOSIS — M9905 Segmental and somatic dysfunction of pelvic region: Secondary | ICD-10-CM | POA: Diagnosis not present

## 2018-03-26 DIAGNOSIS — M9904 Segmental and somatic dysfunction of sacral region: Secondary | ICD-10-CM | POA: Diagnosis not present

## 2018-03-29 DIAGNOSIS — M5137 Other intervertebral disc degeneration, lumbosacral region: Secondary | ICD-10-CM | POA: Diagnosis not present

## 2018-03-29 DIAGNOSIS — M9904 Segmental and somatic dysfunction of sacral region: Secondary | ICD-10-CM | POA: Diagnosis not present

## 2018-03-29 DIAGNOSIS — M9905 Segmental and somatic dysfunction of pelvic region: Secondary | ICD-10-CM | POA: Diagnosis not present

## 2018-03-29 DIAGNOSIS — M9903 Segmental and somatic dysfunction of lumbar region: Secondary | ICD-10-CM | POA: Diagnosis not present

## 2018-04-02 DIAGNOSIS — M9903 Segmental and somatic dysfunction of lumbar region: Secondary | ICD-10-CM | POA: Diagnosis not present

## 2018-04-02 DIAGNOSIS — R1032 Left lower quadrant pain: Secondary | ICD-10-CM | POA: Diagnosis not present

## 2018-04-02 DIAGNOSIS — M5137 Other intervertebral disc degeneration, lumbosacral region: Secondary | ICD-10-CM | POA: Diagnosis not present

## 2018-04-02 DIAGNOSIS — M9905 Segmental and somatic dysfunction of pelvic region: Secondary | ICD-10-CM | POA: Diagnosis not present

## 2018-04-02 DIAGNOSIS — M9904 Segmental and somatic dysfunction of sacral region: Secondary | ICD-10-CM | POA: Diagnosis not present

## 2018-04-05 DIAGNOSIS — M9904 Segmental and somatic dysfunction of sacral region: Secondary | ICD-10-CM | POA: Diagnosis not present

## 2018-04-05 DIAGNOSIS — M9905 Segmental and somatic dysfunction of pelvic region: Secondary | ICD-10-CM | POA: Diagnosis not present

## 2018-04-05 DIAGNOSIS — M5137 Other intervertebral disc degeneration, lumbosacral region: Secondary | ICD-10-CM | POA: Diagnosis not present

## 2018-04-05 DIAGNOSIS — M9903 Segmental and somatic dysfunction of lumbar region: Secondary | ICD-10-CM | POA: Diagnosis not present

## 2018-04-09 DIAGNOSIS — M9903 Segmental and somatic dysfunction of lumbar region: Secondary | ICD-10-CM | POA: Diagnosis not present

## 2018-04-09 DIAGNOSIS — M9905 Segmental and somatic dysfunction of pelvic region: Secondary | ICD-10-CM | POA: Diagnosis not present

## 2018-04-09 DIAGNOSIS — M5137 Other intervertebral disc degeneration, lumbosacral region: Secondary | ICD-10-CM | POA: Diagnosis not present

## 2018-04-09 DIAGNOSIS — M9904 Segmental and somatic dysfunction of sacral region: Secondary | ICD-10-CM | POA: Diagnosis not present

## 2018-04-17 DIAGNOSIS — M9904 Segmental and somatic dysfunction of sacral region: Secondary | ICD-10-CM | POA: Diagnosis not present

## 2018-04-17 DIAGNOSIS — M5137 Other intervertebral disc degeneration, lumbosacral region: Secondary | ICD-10-CM | POA: Diagnosis not present

## 2018-04-17 DIAGNOSIS — M9905 Segmental and somatic dysfunction of pelvic region: Secondary | ICD-10-CM | POA: Diagnosis not present

## 2018-04-17 DIAGNOSIS — M9903 Segmental and somatic dysfunction of lumbar region: Secondary | ICD-10-CM | POA: Diagnosis not present

## 2018-04-23 DIAGNOSIS — M5137 Other intervertebral disc degeneration, lumbosacral region: Secondary | ICD-10-CM | POA: Diagnosis not present

## 2018-04-23 DIAGNOSIS — M9905 Segmental and somatic dysfunction of pelvic region: Secondary | ICD-10-CM | POA: Diagnosis not present

## 2018-04-23 DIAGNOSIS — M9904 Segmental and somatic dysfunction of sacral region: Secondary | ICD-10-CM | POA: Diagnosis not present

## 2018-04-23 DIAGNOSIS — M9903 Segmental and somatic dysfunction of lumbar region: Secondary | ICD-10-CM | POA: Diagnosis not present

## 2018-04-25 ENCOUNTER — Ambulatory Visit (INDEPENDENT_AMBULATORY_CARE_PROVIDER_SITE_OTHER): Payer: Medicare Other | Admitting: Family Medicine

## 2018-04-25 ENCOUNTER — Encounter: Payer: Self-pay | Admitting: Family Medicine

## 2018-04-25 ENCOUNTER — Other Ambulatory Visit: Payer: Self-pay | Admitting: *Deleted

## 2018-04-25 VITALS — BP 125/78 | HR 72 | Temp 96.9°F | Ht 69.0 in | Wt 184.6 lb

## 2018-04-25 DIAGNOSIS — B070 Plantar wart: Secondary | ICD-10-CM

## 2018-04-25 MED ORDER — FEXOFENADINE-PSEUDOEPHED ER 60-120 MG PO TB12: ORAL_TABLET | ORAL | 5 refills | Status: DC

## 2018-04-25 NOTE — Progress Notes (Signed)
BP 125/78   Pulse 72   Temp (!) 96.9 F (36.1 C) (Oral)   Ht 5\' 9"  (1.753 m)   Wt 184 lb 9.6 oz (83.7 kg)   SpO2 97%   BMI 27.26 kg/m    Subjective:    Patient ID: Patrick Bell, male    DOB: 1940/04/23, 78 y.o.   MRN: [id number]  HPI: Patrick Bell is a 78 y.o. male presenting on 04/25/2018 for Foot Pain (bilateral feet pain when putting pressue on feet. Wart?)   HPI patient is coming in today for wart on his right foot near the front of his right foot that has popped up over the past few weeks and has started to be painful when he walks on it.  He says that he initially thought there might be something in it like a sliver but then it has started to form what looks like a wart.  He denies any redness or warmth but just has pain when he steps on it.  Relevant past medical, surgical, family and social history reviewed and updated as indicated. Interim medical history since our last visit reviewed. Allergies and medications reviewed and updated.  Review of Systems  Constitutional: Negative for chills and fever.  Respiratory: Negative for shortness of breath and wheezing.   Cardiovascular: Negative for chest pain and leg swelling.  Musculoskeletal: Positive for arthralgias. Negative for back pain and gait problem.  Skin: Negative for rash.  All other systems reviewed and are negative.   Per HPI unless specifically indicated above   Allergies as of 04/25/2018   No Known Allergies     Medication List       Accurate as of April 25, 2018  3:01 PM. Always use your most recent med list.        fexofenadine-pseudoephedrine 60-120 MG 12 hr tablet Commonly known as:  ALLEGRA-D Take 1 po bid   montelukast 10 MG tablet Commonly known as:  SINGULAIR Take 1 tablet (10 mg total) daily by mouth.   Olopatadine HCl 0.2 % Soln Commonly known as:  PATADAY INSTILL 1 DROP IN EACH EYE TWICE DAILY   pantoprazole 40 MG tablet Commonly known as:  PROTONIX Take 40 mg by mouth  daily.   PROAIR HFA 108 (90 Base) MCG/ACT inhaler Generic drug:  albuterol Inhale 2 puffs into the lungs every 6 (six) hours as needed for wheezing.   simvastatin 10 MG tablet Commonly known as:  ZOCOR Take 1 tablet (10 mg total) at bedtime by mouth.          Objective:    BP 125/78   Pulse 72   Temp (!) 96.9 F (36.1 C) (Oral)   Ht 5\' 9"  (1.753 m)   Wt 184 lb 9.6 oz (83.7 kg)   SpO2 97%   BMI 27.26 kg/m   Wt Readings from Last 3 Encounters:  04/25/18 184 lb 9.6 oz (83.7 kg)  03/12/18 178 lb (80.7 kg)  03/02/18 184 lb (83.5 kg)    Physical Exam Vitals signs and nursing note reviewed.  Constitutional:      General: He is not in acute distress.    Appearance: He is well-developed. He is not diaphoretic.  Eyes:     General: No scleral icterus.    Conjunctiva/sclera: Conjunctivae normal.  Neck:     Thyroid: No thyromegaly.  Cardiovascular:     Rate and Rhythm: Normal rate and regular rhythm.     Heart sounds: Normal heart sounds. No murmur.  Pulmonary:     Effort: Pulmonary effort is normal. No respiratory distress.     Breath sounds: Normal breath sounds. No wheezing.  Musculoskeletal: Normal range of motion.       Feet:  Skin:    General: Skin is warm and dry.     Findings: No rash.  Neurological:     Mental Status: He is alert and oriented to person, place, and time.     Coordination: Coordination normal.  Psychiatric:        Behavior: Behavior normal.     Wart cryotherapy did cryotherapy on more of 3-10-second bursts, patient had some pain but did tolerate.  Return in 2 to 3 weeks if needed warned about blistering and scabbing over and to keep it dry and clean.    Assessment & Plan:   Problem List Items Addressed This Visit    None    Visit Diagnoses    Plantar wart of right foot    -  Primary   Very small plantar wart near the left forefoot on the plantar side, did cryotherapy       Follow up plan: Return if symptoms worsen or fail to  improve, for Return in 2 to 3 weeks if wart re-presents itself.  Counseling provided for all of the vaccine components No orders of the defined types were placed in this encounter.   Caryl Pina, MD [facility] 04/25/2018, 3:01 PM

## 2018-05-01 DIAGNOSIS — M9903 Segmental and somatic dysfunction of lumbar region: Secondary | ICD-10-CM | POA: Diagnosis not present

## 2018-05-01 DIAGNOSIS — M9904 Segmental and somatic dysfunction of sacral region: Secondary | ICD-10-CM | POA: Diagnosis not present

## 2018-05-01 DIAGNOSIS — M5137 Other intervertebral disc degeneration, lumbosacral region: Secondary | ICD-10-CM | POA: Diagnosis not present

## 2018-05-01 DIAGNOSIS — M9905 Segmental and somatic dysfunction of pelvic region: Secondary | ICD-10-CM | POA: Diagnosis not present

## 2018-05-15 ENCOUNTER — Other Ambulatory Visit: Payer: Self-pay | Admitting: Family Medicine

## 2018-05-16 ENCOUNTER — Ambulatory Visit (INDEPENDENT_AMBULATORY_CARE_PROVIDER_SITE_OTHER): Payer: Medicare Other | Admitting: Family Medicine

## 2018-05-16 ENCOUNTER — Encounter: Payer: Self-pay | Admitting: Family Medicine

## 2018-05-16 VITALS — BP 140/84 | HR 66 | Temp 96.8°F | Ht 69.0 in | Wt 185.8 lb

## 2018-05-16 DIAGNOSIS — I10 Essential (primary) hypertension: Secondary | ICD-10-CM | POA: Diagnosis not present

## 2018-05-16 DIAGNOSIS — E782 Mixed hyperlipidemia: Secondary | ICD-10-CM | POA: Diagnosis not present

## 2018-05-16 DIAGNOSIS — B070 Plantar wart: Secondary | ICD-10-CM | POA: Diagnosis not present

## 2018-05-16 LAB — LIPID PANEL
CHOLESTEROL TOTAL: 209 mg/dL — AB (ref 100–199)
Chol/HDL Ratio: 2.9 ratio (ref 0.0–5.0)
HDL: 72 mg/dL (ref >39)
LDL Calculated: 119 mg/dL — ABNORMAL HIGH (ref 0–99)
Triglycerides: 92 mg/dL (ref 0–149)
VLDL Cholesterol Cal: 18 mg/dL (ref 5–40)

## 2018-05-16 MED ORDER — SIMVASTATIN 10 MG PO TABS: 10.0000 mg | ORAL_TABLET | Freq: Every day | ORAL | 3 refills | Status: DC

## 2018-05-16 NOTE — Progress Notes (Signed)
BP 140/84   Pulse 66   Temp (!) 96.8 F (36 C) (Oral)   Ht 5\' 9"  (1.753 m)   Wt 185 lb 12.8 oz (84.3 kg)   BMI 27.44 kg/m    Subjective:    Patient ID: Patrick Bell, male    DOB: Mar 10, 1941, 78 y.o.   MRN: [id number]  HPI: Patrick Bell is a 78 y.o. male presenting on 05/16/2018 for Hyperlipidemia (check up of chronic medical conditions) and Hypertension  Hyperlipidemia Patient is coming in to recheck his cholesterol. He has not been consistently taking his Simvastatin because he thought his numbers were improving and he might not need it. He denies any side effects from the medication. His last lipid panel was done in 2018 - we will recheck today.  He denies any myalgias from the medication  Hypertension Patient has been controlling his hypertension with diet and activity. He regularly takes his blood pressure at home and reports numbers in the 120-130s over 80s consistently. He does not want to take any BP medication until he needs to due to potential side effects. 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  Plantar Wart Patient had cryotherapy done on the bottom of his right foot 3 weeks ago. The spot is still a small area of thickened skin that appears like a wart, but does not cause him any pain. He does not want to do cryotherapy or removal until it hurts him.  Relevant past medical, surgical, family and social history reviewed and updated as indicated. Interim medical history since our last visit reviewed. Allergies and medications reviewed and updated.  Review of Systems  Constitutional: Negative for fatigue and fever.  HENT: Negative.   Respiratory: Negative for cough and shortness of breath.   Cardiovascular: Negative for chest pain.  Gastrointestinal: Negative for abdominal pain.  Musculoskeletal: Negative for arthralgias and myalgias.  Neurological: Negative for dizziness, weakness and headaches.    Per HPI  unless specifically indicated above   Allergies as of 05/16/2018   No Known Allergies     Medication List       Accurate as of May 16, 2018 11:59 PM. Always use your most recent med list.        fexofenadine-pseudoephedrine 60-120 MG 12 hr tablet Commonly known as:  ALLEGRA-D Take 1 po bid   montelukast 10 MG tablet Commonly known as:  SINGULAIR Take 1 tablet (10 mg total) daily by mouth.   Olopatadine HCl 0.2 % Soln Commonly known as:  PATADAY INSTILL 1 DROP IN EACH EYE TWICE DAILY   pantoprazole 40 MG tablet Commonly known as:  PROTONIX Take 1 tablet (40 mg total) daily by mouth.   PROAIR HFA 108 (90 Base) MCG/ACT inhaler Generic drug:  albuterol Inhale 2 puffs into the lungs every 6 (six) hours as needed for wheezing.   simvastatin 10 MG tablet Commonly known as:  ZOCOR Take 1 tablet (10 mg total) by mouth at bedtime.          Objective:    BP 140/84   Pulse 66   Temp (!) 96.8 F (36 C) (Oral)   Ht 5\' 9"  (1.753 m)   Wt 185 lb 12.8 oz (84.3 kg)   BMI 27.44 kg/m   Wt Readings from Last 3 Encounters:  05/16/18 185 lb 12.8 oz (84.3 kg)  04/25/18 184 lb 9.6 oz (83.7 kg)  03/12/18 178 lb (80.7 kg)    Physical Exam  Constitutional:      General: He is not in acute distress.    Appearance: Normal appearance.  Neck:     Thyroid: No thyromegaly.  Cardiovascular:     Rate and Rhythm: Normal rate and regular rhythm.     Heart sounds: Normal heart sounds.  Pulmonary:     Breath sounds: Normal breath sounds.  Musculoskeletal:     Right lower leg: No edema.     Left lower leg: No edema.  Skin:    Comments: Small plantar wart on bottom of right foot    The 10-year ASCVD risk score Mikey Bussing DC Jr., et al., 2013) is: 31%   Values used to calculate the score:     Age: 55 years     Sex: Male     Is Non-Hispanic African American: No     Diabetic: No     Tobacco smoker: No     Systolic Blood Pressure: 381 mmHg     Is BP treated: No     HDL Cholesterol:  72 mg/dL     Total Cholesterol: 209 mg/dL     Assessment & Plan:   - Discussed ASCVD risk and benefits of taking simvastatin to reduce 10-year risk of cardiac events. Patient is agreeable to taking it now that he has the information. Dosage may increase depending on the results of the lipid panel done today. - Continue to observe the plantar wart and return if it becomes bothersome.  Problem List Items Addressed This Visit      Cardiovascular and Mediastinum   Essential hypertension, benign   Relevant Medications   simvastatin (ZOCOR) 10 MG tablet     Other   Hyperlipidemia - Primary   Relevant Medications   simvastatin (ZOCOR) 10 MG tablet   Other Relevant Orders   Lipid panel (Completed)    Other Visit Diagnoses    Plantar wart of right foot        Patient declined cryotherapy of plantar wart today  Follow up plan: Return in about 6 months (around 11/14/2018), or if symptoms worsen or fail to improve, for Hyperlipidemia and hypertension recheck.  Counseling provided for all of the vaccine components Orders Placed This Encounter  Procedures  . Lipid panel    [place], PA-S [facility] 05/20/2018, 10:00 PM  I was personally present for all components of the history, physical exam and/or medical decision making.  I agree with the documentation performed by the PA student and agree with assessment and plan above.  Caryl Pina, MD [facility] 05/20/2018, 10:02 PM

## 2018-06-20 ENCOUNTER — Ambulatory Visit (INDEPENDENT_AMBULATORY_CARE_PROVIDER_SITE_OTHER): Payer: Medicare Other | Admitting: Family Medicine

## 2018-06-20 ENCOUNTER — Other Ambulatory Visit: Payer: Self-pay

## 2018-06-20 ENCOUNTER — Encounter: Payer: Self-pay | Admitting: Family Medicine

## 2018-06-20 VITALS — BP 144/78 | HR 64 | Temp 97.0°F | Wt 187.0 lb

## 2018-06-20 DIAGNOSIS — M7521 Bicipital tendinitis, right shoulder: Secondary | ICD-10-CM | POA: Diagnosis not present

## 2018-06-20 DIAGNOSIS — S66801A Unspecified injury of other specified muscles, fascia and tendons at wrist and hand level, right hand, initial encounter: Secondary | ICD-10-CM | POA: Diagnosis not present

## 2018-06-20 MED ORDER — AMLODIPINE BESYLATE 5 MG PO TABS: 5.0000 mg | ORAL_TABLET | Freq: Every day | ORAL | 3 refills | Status: DC

## 2018-06-20 NOTE — Patient Instructions (Signed)
Biceps Tendon Tendinitis (Distal)    Distal biceps tendon tendinitis is inflammation of the distal biceps tendon. The distal biceps tendon is a strong cord of tissue that connects the biceps muscle, on the front of the upper arm, to a bone (radius) in the elbow. Distal biceps tendon tendinitis can interfere with the ability to bend the elbow and turn the hand palm-up (supination). This condition is usually caused by overusing the elbow joint and the biceps muscle, and it usually heals within 6 weeks.  Distal biceps tendon tendinitis may include a grade 1 or grade 2 strain of the tendon. A grade 1 strain is mild, and it involves a slight pull of the tendon without any stretching or noticeable tearing of the tendon. There is usually no loss of biceps muscle strength. A grade 2 strain is moderate, and it involves a small tear in the tendon. The tendon is stretched, and biceps muscle strength is usually decreased.  What are the causes?  This condition may be caused by:   A sudden increase in the frequency or intensity of activity that involves the elbow and the biceps muscle.   Overuse of the biceps muscle. This can happen when you do the same movements over and over, such as:  ? Supination.  ? Forceful straightening (hyperextension) of the elbow.  ? Bending of the elbow.   A direct, forceful hit or injury (trauma) to the elbow. This is rare.  What increases the risk?  The following factors may make you more likely to develop this condition:   Playing contact sports.   Playing sports that involve throwing and overhead movements, including racket sports, gymnastics, weight lifting, or bodybuilding.   Doing physical labor.   Having poor strength and flexibility of the arm and shoulder.   Having injured other parts of the elbow.  What are the signs or symptoms?  Symptoms of this condition may include:   Pain and inflammation in the front of the elbow. Pain may get worse during certain movements, such  as:  ? Supination.  ? Bending the elbow.  ? Lifting or carrying objects.  ? Throwing.   A feeling of warmth in the front of the elbow.   A crackling sound (crepitation) when you move or touch the elbow or the upper arm.  In some cases, symptoms may return (recur) after treatment, and they may be long-lasting (chronic).  How is this diagnosed?  This condition is diagnosed based on your symptoms, your medical history, and a physical exam. You may have tests, including X-rays or MRIs. Your health care provider may test your range of motion by having you do arm movements.  How is this treated?  This condition is treated by resting and icing the injured area, and by doing physical therapy exercises. Depending on the severity of your condition, treatment may also include:   Medicines to help relieve pain and inflammation.   Ultrasound therapy. This is the application of sound waves to the injured area.  Follow these instructions at home:  Managing pain, stiffness, and swelling     If directed, put ice on the injured area:  ? Put ice in a plastic bag.  ? Place a towel between your skin and the bag.  ? Leave the ice on for 20 minutes, 2-3 times a day.   Move your fingers often to avoid stiffness and to lessen swelling.   Raise (elevate) the injured area above the level of your heart while you are   sitting or lying down.   If directed, apply heat to the affected area before you exercise. Use the heat source that your health care provider recommends, such as a moist heat pack or a heating pad.  ? Place a towel between your skin and the heat source.  ? Leave the heat on for 20-30 minutes.  ? Remove the heat if your skin turns bright red. This is especially important if you are unable to feel pain, heat, or cold. You may have a greater risk of getting burned.  Activity   Return to your normal activities as told by your health care provider. Ask your health care provider what activities are safe for you.   Do not lift  anything that is heavier than 10 lb (4.5 kg) until your health care provider tells you that it is safe.   Avoid activities that cause pain or make your condition worse.   Do exercises as told by your health care provider.  General instructions   Take over-the-counter and prescription medicines only as told by your health care provider.   Do not drive or operate heavy machinery while taking prescription pain medicines.   Keep all follow-up visits as told by your health care provider. This is important.  How is this prevented?   Warm up and stretch before being active.   Cool down and stretch after being active.   Give your body time to rest between periods of activity.   Make sure to use equipment that fits you.   Be safe and responsible while being active to avoid falls.   Do at least 150 minutes of moderate-intensity exercise each week, such as brisk walking or water aerobics.   Maintain physical fitness, including:  ? Strength.  ? Flexibility.  ? Cardiovascular fitness.  ? Endurance.  Contact a health care provider if:   You have symptoms that get worse or do not get better after 2 weeks of treatment.   You develop new symptoms.  Get help right away if:   You develop severe pain.  This information is not intended to replace advice given to you by your health care provider. Make sure you discuss any questions you have with your health care provider.  Document Released: 03/21/2005 Document Revised: 11/26/2015 Document Reviewed: 02/27/2015  Elsevier Interactive Patient Education  2019 [employer]

## 2018-06-20 NOTE — Progress Notes (Signed)
BP (!) 144/78   Pulse 64   Temp (!) 97 F (36.1 C) (Oral)   Wt 187 lb (84.8 kg)   BMI 27.62 kg/m    Subjective:    Patient ID: Patrick Bell, male    DOB: 1940/12/06, 78 y.o.   MRN: [id number]  HPI: Patrick Bell is a 78 y.o. male presenting on 06/20/2018 for Elbow Pain (right. Patient states it started yesterday) and Wrist Pain   HPI Comes in today complaining of right elbow and wrist pain.  He says it started initially around the right elbow and says he was lifting some things and felt some pain in that right elbow and then into the right biceps which does go up almost to his shoulder and then after that the next day he started to have some pain in his right forearm extending down to his wrist.  He says he was lifting something when he initially irritated it which is what brought it on.  He says he denies any weakness but does have significant pain with range of motion and flexion of the elbow.  Relevant past medical, surgical, family and social history reviewed and updated as indicated. Interim medical history since our last visit reviewed. Allergies and medications reviewed and updated.  Review of Systems  Constitutional: Negative for chills and fever.  Respiratory: Negative for shortness of breath and wheezing.   Cardiovascular: Negative for chest pain and leg swelling.  Musculoskeletal: Positive for arthralgias and myalgias. Negative for back pain and gait problem.  Skin: Negative for rash.  All other systems reviewed and are negative.   Per HPI unless specifically indicated above   Allergies as of 06/20/2018   No Known Allergies     Medication List       Accurate as of June 20, 2018  8:44 AM. Always use your most recent med list.        amLODipine 5 MG tablet Commonly known as:  NORVASC Take 1 tablet (5 mg total) by mouth daily.   fexofenadine-pseudoephedrine 60-120 MG 12 hr tablet Commonly known as:  ALLEGRA-D Take 1 po bid   montelukast 10 MG tablet  Commonly known as:  SINGULAIR Take 1 tablet (10 mg total) daily by mouth.   Olopatadine HCl 0.2 % Soln Commonly known as:  Pataday INSTILL 1 DROP IN EACH EYE TWICE DAILY   pantoprazole 40 MG tablet Commonly known as:  PROTONIX Take 1 tablet (40 mg total) daily by mouth.   ProAir HFA 108 (90 Base) MCG/ACT inhaler Generic drug:  albuterol Inhale 2 puffs into the lungs every 6 (six) hours as needed for wheezing.   simvastatin 10 MG tablet Commonly known as:  ZOCOR Take 1 tablet (10 mg total) by mouth at bedtime.          Objective:    BP (!) 144/78   Pulse 64   Temp (!) 97 F (36.1 C) (Oral)   Wt 187 lb (84.8 kg)   BMI 27.62 kg/m   Wt Readings from Last 3 Encounters:  06/20/18 187 lb (84.8 kg)  05/16/18 185 lb 12.8 oz (84.3 kg)  04/25/18 184 lb 9.6 oz (83.7 kg)    Physical Exam Vitals signs and nursing note reviewed.  Constitutional:      General: He is not in acute distress.    Appearance: He is well-developed. He is not diaphoretic.  Eyes:     General: No scleral icterus.    Conjunctiva/sclera: Conjunctivae normal.  Neck:  Musculoskeletal: Neck supple.     Thyroid: No thyromegaly.  Cardiovascular:     Rate and Rhythm: Normal rate and regular rhythm.     Heart sounds: Normal heart sounds. No murmur.  Pulmonary:     Effort: Pulmonary effort is normal. No respiratory distress.     Breath sounds: Normal breath sounds. No wheezing.  Musculoskeletal: Normal range of motion.     Right elbow: Tenderness (Tenderness over the distal biceps tendon and over anterior lateral forearm) found.  Lymphadenopathy:     Cervical: No cervical adenopathy.  Skin:    General: Skin is warm and dry.     Findings: No rash.  Neurological:     Mental Status: He is alert and oriented to person, place, and time.     Coordination: Coordination normal.  Psychiatric:        Behavior: Behavior normal.         Assessment & Plan:   Problem List Items Addressed This Visit     None    Visit Diagnoses    Biceps tendinitis, right    -  Primary   Injury of flexor tendon of hand, right, initial encounter        Gave education on the biceps  The biceps tendon does not appear to be torn and strength intact and appears to be more irritated, there is a possibility that he might of torn his palmaris longus tendon but no weakness in the wrist is noted.  Follow up plan: Return if symptoms worsen or fail to improve.  Counseling provided for all of the vaccine components No orders of the defined types were placed in this encounter.   Caryl Pina, MD [facility] 06/20/2018, 8:44 AM

## 2018-07-11 DIAGNOSIS — S46291A Other injury of muscle, fascia and tendon of other parts of biceps, right arm, initial encounter: Secondary | ICD-10-CM | POA: Diagnosis not present

## 2018-07-11 DIAGNOSIS — M25521 Pain in right elbow: Secondary | ICD-10-CM | POA: Diagnosis not present

## 2018-07-17 DIAGNOSIS — M25521 Pain in right elbow: Secondary | ICD-10-CM | POA: Diagnosis not present

## 2018-07-24 DIAGNOSIS — M25521 Pain in right elbow: Secondary | ICD-10-CM | POA: Diagnosis not present

## 2018-07-27 DIAGNOSIS — M25521 Pain in right elbow: Secondary | ICD-10-CM | POA: Diagnosis not present

## 2018-08-03 DIAGNOSIS — M25521 Pain in right elbow: Secondary | ICD-10-CM | POA: Diagnosis not present

## 2018-08-04 ENCOUNTER — Other Ambulatory Visit: Payer: Self-pay | Admitting: Family Medicine

## 2018-08-07 DIAGNOSIS — S46291A Other injury of muscle, fascia and tendon of other parts of biceps, right arm, initial encounter: Secondary | ICD-10-CM | POA: Diagnosis not present

## 2018-08-08 ENCOUNTER — Encounter: Payer: Self-pay | Admitting: Family Medicine

## 2018-08-08 ENCOUNTER — Ambulatory Visit (INDEPENDENT_AMBULATORY_CARE_PROVIDER_SITE_OTHER): Payer: Medicare Other | Admitting: Family Medicine

## 2018-08-08 ENCOUNTER — Other Ambulatory Visit: Payer: Self-pay

## 2018-08-08 VITALS — BP 140/71 | HR 67 | Temp 97.0°F | Ht 69.0 in | Wt 188.0 lb

## 2018-08-08 DIAGNOSIS — I10 Essential (primary) hypertension: Secondary | ICD-10-CM

## 2018-08-08 MED ORDER — AMLODIPINE BESYLATE 2.5 MG PO TABS: 2.5000 mg | ORAL_TABLET | Freq: Every day | ORAL | 5 refills | Status: DC

## 2018-08-08 NOTE — Progress Notes (Signed)
BP 140/71   Pulse 67   Temp (!) 97 F (36.1 C) (Oral)   Ht 5\' 9"  (1.753 m)   Wt 188 lb (85.3 kg)   BMI 27.76 kg/m    Subjective:   Patient ID: Patrick Bell, male    DOB: [birth year], 78 y.o.   MRN: [id number]  HPI: Patrick Bell is a 78 y.o. male presenting on 08/08/2018 for Hypertension (Amlodiopine makes him dizzy )   HPI Hypertension Patient is currently on no medication today and stopped the amlodipine, and their blood pressure today is 140/71.  Patient has been having lightheadedness and dizziness episodes and so he stopped his amlodipine.  He just felt like it was going down too much and he was dizzy all the time any time that he took it.. Patient denies headaches, blurred vision, chest pains, shortness of breath, or weakness.  Relevant past medical, surgical, family and social history reviewed and updated as indicated. Interim medical history since our last visit reviewed. Allergies and medications reviewed and updated.  Review of Systems  Constitutional: Negative for chills and fever.  Respiratory: Negative for shortness of breath and wheezing.   Cardiovascular: Negative for chest pain and leg swelling.  Musculoskeletal: Negative for back pain and gait problem.  Skin: Negative for rash.  Neurological: Positive for dizziness and light-headedness. Negative for weakness and numbness.  All other systems reviewed and are negative.   Per HPI unless specifically indicated above   Allergies as of 08/08/2018   No Known Allergies     Medication List       Accurate as of Aug 08, 2018 11:35 AM. Always use your most recent med list.        amLODipine 2.5 MG tablet Commonly known as:  NORVASC Take 1 tablet (2.5 mg total) by mouth daily.   fexofenadine-pseudoephedrine 60-120 MG 12 hr tablet Commonly known as:  ALLEGRA-D Take 1 po bid   montelukast 10 MG tablet Commonly known as:  SINGULAIR Take 1 tablet (10 mg total) daily by mouth.   Olopatadine HCl 0.2 % Soln  Commonly known as:  Pataday INSTILL 1 DROP IN EACH EYE TWICE DAILY   pantoprazole 40 MG tablet Commonly known as:  PROTONIX Take 1 tablet (40 mg total) daily by mouth.   ProAir HFA 108 (90 Base) MCG/ACT inhaler Generic drug:  albuterol Inhale 2 puffs into the lungs every 6 (six) hours as needed for wheezing.   simvastatin 10 MG tablet Commonly known as:  ZOCOR Take 1 tablet (10 mg total) by mouth at bedtime.        Objective:   BP 140/71   Pulse 67   Temp (!) 97 F (36.1 C) (Oral)   Ht 5\' 9"  (1.753 m)   Wt 188 lb (85.3 kg)   BMI 27.76 kg/m   Wt Readings from Last 3 Encounters:  08/08/18 188 lb (85.3 kg)  06/20/18 187 lb (84.8 kg)  05/16/18 185 lb 12.8 oz (84.3 kg)    Physical Exam Vitals signs and nursing note reviewed.  Constitutional:      General: He is not in acute distress.    Appearance: He is well-developed. He is not diaphoretic.  Eyes:     General: No scleral icterus.    Conjunctiva/sclera: Conjunctivae normal.  Neck:     Musculoskeletal: Neck supple.     Thyroid: No thyromegaly.  Cardiovascular:     Rate and Rhythm: Normal rate and regular rhythm.     Heart sounds:  Normal heart sounds. No murmur.  Pulmonary:     Effort: Pulmonary effort is normal. No respiratory distress.     Breath sounds: Normal breath sounds. No wheezing.  Lymphadenopathy:     Cervical: No cervical adenopathy.  Neurological:     Mental Status: He is alert and oriented to person, place, and time.     Coordination: Coordination normal.  Psychiatric:        Behavior: Behavior normal.       Assessment & Plan:   Problem List Items Addressed This Visit      Cardiovascular and Mediastinum   Essential hypertension, benign - Primary   Relevant Medications   amLODipine (NORVASC) 2.5 MG tablet      Lower dose of amlodipine to 2.5 mg, may consider hydrochlorothiazide in the future as well for mostly isolated systolic hypertension.  Patient will send my chart message in 2  weeks or call in and let me know how it is going. Follow up plan: Return if symptoms worsen or fail to improve.  Counseling provided for all of the vaccine components No orders of the defined types were placed in this encounter.   Caryl Pina, MD [facility] 08/08/2018, 11:35 AM

## 2018-08-13 ENCOUNTER — Ambulatory Visit (INDEPENDENT_AMBULATORY_CARE_PROVIDER_SITE_OTHER): Payer: Medicare Other | Admitting: Family

## 2018-08-13 ENCOUNTER — Encounter: Payer: Self-pay | Admitting: Family

## 2018-08-13 ENCOUNTER — Other Ambulatory Visit: Payer: Self-pay

## 2018-08-13 VITALS — BP 129/73 | HR 79 | Temp 97.8°F | Ht 69.0 in | Wt 188.0 lb

## 2018-08-13 DIAGNOSIS — H00015 Hordeolum externum left lower eyelid: Secondary | ICD-10-CM | POA: Diagnosis not present

## 2018-08-13 MED ORDER — BACITRACIN-POLYMYXIN B 500-10000 UNIT/GM OP OINT: 1.0000 "application " | TOPICAL_OINTMENT | Freq: Four times a day (QID) | OPHTHALMIC | 0 refills | Status: DC

## 2018-08-13 MED ORDER — CEPHALEXIN 500 MG PO CAPS: 500.0000 mg | ORAL_CAPSULE | Freq: Four times a day (QID) | ORAL | 0 refills | Status: AC

## 2018-08-13 NOTE — Progress Notes (Signed)
Subjective:    Patient ID: Patrick Bell, male    DOB: [birth year], 78 y.o.   MRN: [id number]  Chief Complaint  Patient presents with  . pain in left eye    white discharge, ( weeding in yard last week)    HPI PT presents to the office today with complaints of left eye irration. He states he was weed eating 9 days ago and felt something in his eye. He states he washed his eyes out and have been using OTC eye drops everyday.   He states 4 days ago he had a "bump pop" up on his left lower eye lid. He reports yesterday his eye was draining yellow discharge and was matted together.    Review of Systems  Eyes: Positive for pain, discharge and redness.  All other systems reviewed and are negative.      Objective:   Physical Exam Vitals signs reviewed.  Constitutional:      General: He is not in acute distress.    Appearance: He is well-developed.  HENT:     Head: Normocephalic.  Eyes:     General:        Right eye: No discharge.        Left eye: Discharge and hordeolum present.    Pupils: Pupils are equal, round, and reactive to light.   Neck:     Musculoskeletal: Normal range of motion and neck supple.     Thyroid: No thyromegaly.  Cardiovascular:     Rate and Rhythm: Normal rate and regular rhythm.     Heart sounds: Normal heart sounds. No murmur.  Pulmonary:     Effort: Pulmonary effort is normal. No respiratory distress.     Breath sounds: Normal breath sounds. No wheezing.  Abdominal:     General: Bowel sounds are normal. There is no distension.     Palpations: Abdomen is soft.     Tenderness: There is no abdominal tenderness.  Musculoskeletal: Normal range of motion.        General: No tenderness.  Skin:    General: Skin is warm and dry.     Findings: No erythema or rash.  Neurological:     Mental Status: He is alert and oriented to person, place, and time.     Cranial Nerves: No cranial nerve deficit.     Deep Tendon Reflexes: Reflexes are normal and  symmetric.  Psychiatric:        Behavior: Behavior normal.        Thought Content: Thought content normal.        Judgment: Judgment normal.       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 Hordeolum externum of left lower eyelid Warm compresses  Good hand hygiene discussed Do not pick or squeeze  RTO in 1 week  - bacitracin-polymyxin b (POLYSPORIN) ophthalmic ointment; Place 1 application into the left eye 4 (four) times daily. apply to eye every 12 hours while awake  Dispense: 3.5 g; Refill: 0 - cephALEXin (KEFLEX) 500 MG capsule; Take 1 capsule (500 mg total) by mouth 4 (four) times daily for 7  days.  Dispense: 28 capsule; Refill: 0  Evelina Dun, FNP

## 2018-08-13 NOTE — Patient Instructions (Signed)

## 2018-08-20 ENCOUNTER — Other Ambulatory Visit: Payer: Self-pay

## 2018-08-21 ENCOUNTER — Encounter: Payer: Self-pay | Admitting: Family

## 2018-08-21 ENCOUNTER — Ambulatory Visit (INDEPENDENT_AMBULATORY_CARE_PROVIDER_SITE_OTHER): Payer: Medicare Other | Admitting: Family

## 2018-08-21 VITALS — BP 132/76 | HR 77 | Temp 97.3°F | Ht 69.0 in | Wt 189.0 lb

## 2018-08-21 DIAGNOSIS — H00015 Hordeolum externum left lower eyelid: Secondary | ICD-10-CM

## 2018-08-21 MED ORDER — OLOPATADINE HCL 0.2 % OP SOLN: OPHTHALMIC | 1 refills | Status: DC

## 2018-08-21 NOTE — Patient Instructions (Signed)

## 2018-08-21 NOTE — Progress Notes (Signed)
   Subjective:    Patient ID: Patrick Bell, male    DOB: 1941-01-12, 78 y.o.   MRN: [id number]  Chief Complaint  Patient presents with  . recheck eye    HPI Pt presents to the office today to recheck stye of left lower eye. He was seen on 08/13/18 and given bacitracin-polymyxin ointment and started on Keflex. He reports it has greatly improved. Denies any discharge, pain, visual changes, or sensitivity to light.     Review of Systems  All other systems reviewed and are negative.      Objective:   Physical Exam Vitals signs reviewed.  Constitutional:      General: He is not in acute distress.    Appearance: He is well-developed.  HENT:     Head: Normocephalic.     Right Ear: External ear normal.     Left Ear: External ear normal.  Eyes:     General:        Right eye: No discharge.        Left eye: Hordeolum present.No discharge.     Pupils: Pupils are equal, round, and reactive to light.      Comments: Stye on outer lower lid greatly improved, still small area inside lid with erythemas surrounding it  Neck:     Musculoskeletal: Normal range of motion and neck supple.     Thyroid: No thyromegaly.  Cardiovascular:     Rate and Rhythm: Normal rate and regular rhythm.     Heart sounds: Normal heart sounds. No murmur.  Pulmonary:     Effort: Pulmonary effort is normal. No respiratory distress.     Breath sounds: Normal breath sounds. No wheezing.  Abdominal:     General: Bowel sounds are normal. There is no distension.     Palpations: Abdomen is soft.     Tenderness: There is no abdominal tenderness.  Musculoskeletal: Normal range of motion.        General: No tenderness.  Skin:    General: Skin is warm and dry.     Findings: No erythema or rash.  Neurological:     Mental Status: He is alert and oriented to person, place, and time.     Cranial Nerves: No cranial nerve deficit.     Deep Tendon Reflexes: Reflexes are normal and symmetric.  Psychiatric:      Behavior: Behavior normal.        Thought Content: Thought content normal.        Judgment: Judgment normal.     BP 132/76   Pulse 77   Temp (!) 97.3 F (36.3 C) (Oral)   Ht 5\' 9"  (1.753 m)   Wt 189 lb (85.7 kg)   BMI 27.91 kg/m       Assessment & Plan:  Patrick Bell comes in today with chief complaint of recheck eye   Diagnosis and orders addressed:  1. Hordeolum externum of left lower eyelid Rest Warm compresses  Continue antibiotic ointment Good hand hygiene RTO in 1 week to recheck if not resolved will send to ophthalmologist   Evelina Dun, FNP

## 2018-08-24 ENCOUNTER — Other Ambulatory Visit: Payer: Self-pay

## 2018-08-28 ENCOUNTER — Other Ambulatory Visit: Payer: Self-pay

## 2018-08-28 ENCOUNTER — Ambulatory Visit (INDEPENDENT_AMBULATORY_CARE_PROVIDER_SITE_OTHER): Payer: Medicare Other | Admitting: Family Medicine

## 2018-08-28 ENCOUNTER — Encounter: Payer: Self-pay | Admitting: Family Medicine

## 2018-08-28 VITALS — BP 134/87 | HR 80 | Temp 97.1°F | Ht 69.0 in | Wt 188.0 lb

## 2018-08-28 DIAGNOSIS — H00015 Hordeolum externum left lower eyelid: Secondary | ICD-10-CM

## 2018-08-28 NOTE — Progress Notes (Signed)
BP 134/87   Pulse 80   Temp (!) 97.1 F (36.2 C) (Oral)   Ht 5\' 9"  (1.753 m)   Wt 188 lb (85.3 kg)   BMI 27.76 kg/m    Subjective:   Patient ID: Patrick Bell, male    DOB: June 21, 1940, 78 y.o.   MRN: [id number]  HPI: Patrick Bell is a 78 y.o. male presenting on 08/28/2018 for 1 week re check (left eye)   HPI Left eye stye recheck Patient is coming in for one-week recheck of a stye in his left eye, it is on the lower eyelid and he feels like the swelling is down but he is doing much better.  He was given a antibiotic ointment to put in the eyelid and doing warm compresses.  He denies any fevers or chills or blurred vision.  He says the swelling and redness is come down significantly but is still has a little bit on the inside of his eyelid where it is swollen and red.  He thought he may have gotten a foreign body in there but does not know for sure.  Relevant past medical, surgical, family and social history reviewed and updated as indicated. Interim medical history since our last visit reviewed. Allergies and medications reviewed and updated.  Review of Systems  Constitutional: Negative for chills and fever.  Respiratory: Negative for shortness of breath and wheezing.   Cardiovascular: Negative for chest pain and leg swelling.  Skin: Positive for color change. Negative for rash.  All other systems reviewed and are negative.   Per HPI unless specifically indicated above   Allergies as of 08/28/2018   No Known Allergies     Medication List       Accurate as of Aug 28, 2018 10:33 AM. If you have any questions, ask your nurse or doctor.        amLODipine 2.5 MG tablet Commonly known as:  NORVASC Take 1 tablet (2.5 mg total) by mouth daily.   bacitracin-polymyxin b ophthalmic ointment Commonly known as:  POLYSPORIN Place 1 application into the left eye 4 (four) times daily. apply to eye every 12 hours while awake   fexofenadine-pseudoephedrine 60-120 MG 12 hr  tablet Commonly known as:  ALLEGRA-D Take 1 po bid   montelukast 10 MG tablet Commonly known as:  SINGULAIR Take 1 tablet (10 mg total) daily by mouth.   Olopatadine HCl 0.2 % Soln Commonly known as:  Pataday INSTILL 1 DROP IN EACH EYE TWICE DAILY   pantoprazole 40 MG tablet Commonly known as:  PROTONIX Take 1 tablet (40 mg total) daily by mouth.   ProAir HFA 108 (90 Base) MCG/ACT inhaler Generic drug:  albuterol Inhale 2 puffs into the lungs every 6 (six) hours as needed for wheezing.   simvastatin 10 MG tablet Commonly known as:  ZOCOR Take 1 tablet (10 mg total) by mouth at bedtime.        Objective:   BP 134/87   Pulse 80   Temp (!) 97.1 F (36.2 C) (Oral)   Ht 5\' 9"  (1.753 m)   Wt 188 lb (85.3 kg)   BMI 27.76 kg/m   Wt Readings from Last 3 Encounters:  08/28/18 188 lb (85.3 kg)  08/21/18 189 lb (85.7 kg)  08/13/18 188 lb (85.3 kg)    Physical Exam Vitals signs and nursing note reviewed.  Constitutional:      General: He is not in acute distress.    Appearance: He is well-developed.  He is not diaphoretic.  Eyes:     General: No scleral icterus.    Conjunctiva/sclera: Conjunctivae normal.  Neck:     Thyroid: No thyromegaly.  Skin:    General: Skin is warm and dry.     Findings: Erythema (Slight amount of erythema on lower eyelid in the center of the left eye with a small whitehead in the area on the inside of the eyelid, a small amount of surrounding erythema and a small amount of swelling in the left lower eyelid) present. No rash.  Neurological:     Mental Status: He is alert and oriented to person, place, and time.     Coordination: Coordination normal.     Used a 25-gauge needle to attempt to lance just slightly, sanguinous exudate was all that came out, patient tolerated well, very minimal bleeding  Assessment & Plan:   Problem List Items Addressed This Visit    None    Visit Diagnoses    Hordeolum externum of left lower eyelid    -   Primary      Continue bacitracin polymyxin ointment and follow-up in 2 weeks if needed Follow up plan: Return in about 2 weeks (around 09/11/2018), or if symptoms worsen or fail to improve, for Left eye hordeolum recheck.  Counseling provided for all of the vaccine components No orders of the defined types were placed in this encounter.   Caryl Pina, MD [facility] 08/28/2018, 10:33 AM

## 2018-09-14 ENCOUNTER — Telehealth: Payer: Self-pay | Admitting: Family Medicine

## 2018-09-17 ENCOUNTER — Other Ambulatory Visit: Payer: Self-pay

## 2018-09-17 ENCOUNTER — Encounter: Payer: Self-pay | Admitting: Family Medicine

## 2018-09-17 ENCOUNTER — Ambulatory Visit (INDEPENDENT_AMBULATORY_CARE_PROVIDER_SITE_OTHER): Payer: Medicare Other | Admitting: Family Medicine

## 2018-09-17 VITALS — BP 142/75 | HR 66 | Temp 97.0°F | Ht 69.0 in | Wt 187.4 lb

## 2018-09-17 DIAGNOSIS — H00015 Hordeolum externum left lower eyelid: Secondary | ICD-10-CM | POA: Diagnosis not present

## 2018-09-17 MED ORDER — SULFAMETHOXAZOLE-TRIMETHOPRIM 800-160 MG PO TABS: 1.0000 | ORAL_TABLET | Freq: Two times a day (BID) | ORAL | 0 refills | Status: DC

## 2018-09-17 NOTE — Progress Notes (Signed)
BP (!) 142/75   Pulse 66   Temp (!) 97 F (36.1 C) (Oral)   Ht 5\' 9"  (1.753 m)   Wt 187 lb 6.4 oz (85 kg)   BMI 27.67 kg/m    Subjective:   Patient ID: Patrick Bell, male    DOB: [birth year], 78 y.o.   MRN: [id number]  HPI: Patrick Bell is a 78 y.o. male presenting on 09/17/2018 for 2 week re check (left eye)   HPI Patient is coming in today for quick two-week recheck on his eye stye on his left lower eyelid.  He says that the pain is finally gone after we lanced it last time and relieve some of the pressure but is still has some redness there that he was concerned about and he wanted to try 1 more course of oral antibiotics and see if it cleared up with that and then return as needed.  The next step may be to go see an ophthalmologist which he has 1 and he will give them a call up if it does not improve with this oral antibiotic  Relevant past medical, surgical, family and social history reviewed and updated as indicated. Interim medical history since our last visit reviewed. Allergies and medications reviewed and updated.  Review of Systems  Constitutional: Negative for chills and fever.  Eyes: Positive for redness (Eyelid redness on the lower eyelid on the inside, swelling is resolved). Negative for pain, discharge and visual disturbance.  Respiratory: Negative for shortness of breath and wheezing.   Cardiovascular: Negative for chest pain and leg swelling.  Musculoskeletal: Negative for back pain and gait problem.  Skin: Negative for rash.  All other systems reviewed and are negative.   Per HPI unless specifically indicated above      Objective:   BP (!) 142/75   Pulse 66   Temp (!) 97 F (36.1 C) (Oral)   Ht 5\' 9"  (1.753 m)   Wt 187 lb 6.4 oz (85 kg)   BMI 27.67 kg/m   Wt Readings from Last 3 Encounters:  09/17/18 187 lb 6.4 oz (85 kg)  08/28/18 188 lb (85.3 kg)  08/21/18 189 lb (85.7 kg)    Physical Exam Vitals signs and nursing note reviewed.   Constitutional:      General: He is not in acute distress.    Appearance: He is well-developed. He is not diaphoretic.  Eyes:     General: No scleral icterus.       Right eye: No discharge.        Left eye: Hordeolum (On the left lower inner eyelid still remnants of the hordeolum with some erythema but no swelling or tenderness remains) present.    Conjunctiva/sclera: Conjunctivae normal.     Pupils: Pupils are equal, round, and reactive to light.  Neck:     Thyroid: No thyromegaly.  Neurological:     Mental Status: He is alert and oriented to person, place, and time.     Coordination: Coordination normal.  Psychiatric:        Behavior: Behavior normal.       Assessment & Plan:   Problem List Items Addressed This Visit    None    Visit Diagnoses    Hordeolum externum of left lower eyelid    -  Primary   Relevant Medications   sulfamethoxazole-trimethoprim (BACTRIM DS) 800-160 MG tablet      The irritation is gone ever since we lanced it last time but he  still having the redness on his lower eyelid and he is concerned with how it looks, he would like to do another round of antibiotics, he has an ophthalmologist Dr. Doloris Hall who he will call and see if they will work on it if this does not heal with this round of antibiotics. Follow up plan: Return if symptoms worsen or fail to improve.  Counseling provided for all of the vaccine components No orders of the defined types were placed in this encounter.   Caryl Pina, MD [facility] 09/17/2018, 10:33 AM

## 2018-10-10 ENCOUNTER — Encounter: Payer: Self-pay | Admitting: Family Medicine

## 2018-10-10 ENCOUNTER — Ambulatory Visit (INDEPENDENT_AMBULATORY_CARE_PROVIDER_SITE_OTHER): Payer: Medicare Other | Admitting: Family Medicine

## 2018-10-10 ENCOUNTER — Other Ambulatory Visit: Payer: Self-pay

## 2018-10-10 DIAGNOSIS — R21 Rash and other nonspecific skin eruption: Secondary | ICD-10-CM | POA: Diagnosis not present

## 2018-10-10 MED ORDER — TRIAMCINOLONE ACETONIDE 0.1 % EX CREA: 1.0000 "application " | TOPICAL_CREAM | Freq: Two times a day (BID) | CUTANEOUS | 0 refills | Status: DC

## 2018-10-10 MED ORDER — HYDROXYZINE HCL 10 MG PO TABS: 10.0000 mg | ORAL_TABLET | Freq: Three times a day (TID) | ORAL | 0 refills | Status: DC | PRN

## 2018-10-10 NOTE — Progress Notes (Signed)
Virtual Visit via telephone Note Due to COVID-19, visit is conducted virtually and was requested by patient. This visit type was conducted due to national recommendations for restrictions regarding the COVID-19 Pandemic (e.g. social distancing) in an effort to limit this patient's exposure and mitigate transmission in our community. All issues noted in this document were discussed and addressed.  A physical exam was not performed with this format.   I connected with Patrick Bell on 10/10/18 at 1025 by telephone and verified that I am speaking with the correct person using two identifiers. Patrick Bell is currently located at home and family is currently with them during visit. The provider, Monia Pouch, FNP is located in their home office at time of visit, working remotely.  I discussed the limitations, risks, security and privacy concerns of performing an evaluation and management service by telephone and the availability of in person appointments. I also discussed with the patient that there may be a patient responsible charge related to this service. The patient expressed understanding and agreed to proceed.  Subjective:  Patient ID: Patrick Bell, male    DOB: [birth year], 78 y.o.   MRN: [id number]  Chief Complaint:  Rash   HPI: Patrick Bell is a 78 y.o. male presenting on 10/10/2018 for Rash   Pt reports chigger bites on his legs and arms. States this started 2 days ago. States he has red, pruritic lesions to his arms and legs. States when he scratches the area the center is hard. States he does work outside daily. States the lesions do not have drainage or significant swelling around them. States they are very pruritic. No fever, chills, weakness, arthralgias, or myalgias. Does have animals in the house. States they checked them for fleas and they do not have any.     Relevant past medical, surgical, family, and social history reviewed and updated as indicated.  Allergies  and medications reviewed and updated.   Past Medical History:  Diagnosis Date  . Allergy   . Asthma   . Cataract   . Cataract   . Diverticulitis   . GERD (gastroesophageal reflux disease)   . Hyperlipidemia   . Hypertension     Past Surgical History:  Procedure Laterality Date  . bilateral inguinal hernia    . COLONOSCOPY  06/09/2004   [facility]:PRFFMBW colon diverticulosis, otherwise normal colonoscopy  . EYE SURGERY  08/2014  . HERNIA REPAIR    . PALATE / UVULA BIOPSY / EXCISION    . TONSILLECTOMY    . VASECTOMY      Social History   Socioeconomic History  . Marital status: Married    Spouse name: Not on file  . Number of children: Not on file  . Years of education: Not on file  . Highest education level: Not on file  Occupational History  . 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 Financial resource strain: Not hard at all  . Food insecurity    Worry: Never true    Inability: Never true  . Transportation needs    Medical: No    Non-medical: No  Tobacco Use  . Smoking status: Former Smoker    Packs/day: 4.00    Years: 25.00    Pack years: 100.00    Types: Cigarettes    Quit date: 06/23/1982    Years since quitting: 36.3  . Smokeless tobacco: Never Used  . 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 Alcohol use: Yes    Alcohol/week: 0.0 standard drinks    Comment: brandy a few times a month, beer once a month  . Drug use: No  . Sexual activity: Yes  Lifestyle  . Physical activity    Days per week: 0 days    Minutes per session: 0 min  . Stress: Not on file  Relationships  . Social connections    Talks on phone: More than three times a week    Gets together: More than three times a week    Attends religious service: More than 4 times per year    Active member of club or organization: Yes    Attends meetings of clubs or organizations: More than 4 times per year     Relationship status: Married  . Intimate partner violence    Fear of current or ex partner: No    Emotionally abused: No    Physically abused: No    Forced sexual activity: No  Other Topics Concern  . Not on file  Social History Narrative  . Not on file    Outpatient Encounter Medications as of 10/10/2018  Medication Sig  . amLODipine (NORVASC) 2.5 MG tablet Take 1 tablet (2.5 mg total) by mouth daily.  . bacitracin-polymyxin b (POLYSPORIN) ophthalmic ointment Place 1 application into the left eye 4 (four) times daily. apply to eye every 12 hours while awake  . fexofenadine-pseudoephedrine (ALLEGRA-D) 60-120 MG 12 hr tablet Take 1 po bid  . hydrOXYzine (ATARAX/VISTARIL) 10 MG tablet Take 1 tablet (10 mg total) by mouth 3 (three) times daily as needed.  . montelukast (SINGULAIR) 10 MG tablet Take 1 tablet (10 mg total) daily by mouth.  . Olopatadine HCl (PATADAY) 0.2 % SOLN INSTILL 1 DROP IN [facility] EYE TWICE DAILY  . pantoprazole (PROTONIX) 40 MG tablet Take 1 tablet (40 mg total) daily by mouth.  Marland Kitchen PROAIR HFA 108 (90 Base) MCG/ACT inhaler Inhale 2 puffs into the lungs every 6 (six) hours as needed for wheezing.  . simvastatin (ZOCOR) 10 MG tablet Take 1 tablet (10 mg total) by mouth at bedtime.  . sulfamethoxazole-trimethoprim (BACTRIM DS) 800-160 MG tablet Take 1 tablet by mouth 2 (two) times daily.  Marland Kitchen triamcinolone cream (KENALOG) 0.1 % Apply 1 application topically 2 (two) times daily.   No facility-administered encounter medications on file as of 10/10/2018.     No Known Allergies  Review of Systems  Constitutional: Negative for chills, fatigue and fever.  HENT: Negative for trouble swallowing and voice change.   Respiratory: Negative for cough, choking, chest tightness and shortness of breath.   Cardiovascular: Negative for chest pain and palpitations.  Musculoskeletal: Negative for arthralgias and myalgias.  Skin: Positive for rash.  Neurological: Negative for dizziness,  syncope, weakness, light-headedness and headaches.  Psychiatric/Behavioral: Negative for confusion.  All other systems reviewed and are negative.        Observations/Objective: No vital signs or physical exam, this was a telephone or virtual health encounter.  Pt alert and oriented, answers all questions appropriately, and able to speak in full sentences.    Assessment and Plan: Patrick Bell was seen today for rash.  Diagnoses and all orders for this visit:  Rash Local pruritic rash. No reported signs or symptoms of anaphylaxis or cellulitis. Likely chigger bites per pts description. Symptomatic care discussed. Medications as prescribed. Report any new or worsening symptoms. Wear protective clothing and use insect  repellent when working outdoors.  -     hydrOXYzine (ATARAX/VISTARIL) 10 MG tablet; Take 1 tablet (10 mg total) by mouth 3 (three) times daily as needed. -     triamcinolone cream (KENALOG) 0.1 %; Apply 1 application topically 2 (two) times daily.     Follow Up Instructions: Return if symptoms worsen or fail to improve.    I discussed the assessment and treatment plan with the patient. The patient was provided an opportunity to ask questions and all were answered. The patient agreed with the plan and demonstrated an understanding of the instructions.   The patient was advised to call back or seek an in-person evaluation if the symptoms worsen or if the condition fails to improve as anticipated.  The above assessment and management plan was discussed with the patient. The patient verbalized understanding of and has agreed to the management plan. Patient is aware to call the clinic if symptoms persist or worsen. Patient is aware when to return to the clinic for a follow-up visit. Patient educated on when it is appropriate to go to the emergency department.    I provided 15 minutes of non-face-to-face time during this encounter. The call started at 1025. The call ended at 1040.  The other time was used for coordination of care.    Monia Pouch, FNP-C [facility] [address] [place], [place] [address] [phone]

## 2018-11-14 ENCOUNTER — Other Ambulatory Visit: Payer: Self-pay | Admitting: Family Medicine

## 2018-12-13 ENCOUNTER — Other Ambulatory Visit: Payer: Self-pay | Admitting: *Deleted

## 2018-12-13 DIAGNOSIS — Z20822 Contact with and (suspected) exposure to covid-19: Secondary | ICD-10-CM

## 2018-12-13 DIAGNOSIS — R6889 Other general symptoms and signs: Secondary | ICD-10-CM | POA: Diagnosis not present

## 2018-12-14 LAB — NOVEL CORONAVIRUS, NAA: SARS-CoV-2, NAA: NOT DETECTED

## 2019-01-29 ENCOUNTER — Ambulatory Visit (INDEPENDENT_AMBULATORY_CARE_PROVIDER_SITE_OTHER): Payer: Medicare Other | Admitting: *Deleted

## 2019-01-29 DIAGNOSIS — Z0000 Encounter for general adult medical examination without abnormal findings: Secondary | ICD-10-CM

## 2019-01-29 NOTE — Progress Notes (Signed)
MEDICARE ANNUAL WELLNESS VISIT  01/29/2019  Telephone Visit Disclaimer This Medicare AWV was conducted by telephone due to national recommendations for restrictions regarding the COVID-19 Pandemic (e.g. social distancing).  I verified, using two identifiers, that I am speaking with [name] or their authorized healthcare agent. I discussed the limitations, risks, security, and privacy concerns of performing an evaluation and management service by telephone and the potential availability of an in-person appointment in the future. The patient expressed understanding and agreed to proceed.   Subjective:  THANH WINGER is a 78 y.o. male patient of Dettinger, Fransisca Kaufmann, MD who had a Medicare Annual Wellness Visit today via telephone. Piero is Retired and lives with their spouse. he has 2 children. he reports that he is socially active and does interact with friends/family regularly. he is markedly physically active and enjoys yard work, working on Investment banker, corporate, working with his chainsaws and playing with his cats and dogs.  Patient Care Team: Dettinger, Fransisca Kaufmann, MD as PCP - General (Family Medicine) Danie Binder, MD as Attending Physician (Gastroenterology) Ralene Muskrat as Physician Assistant (Chiropractic Medicine) Druscilla Brownie, MD as Consulting Physician (Dermatology)  Advanced Directives 01/29/2019 01/23/2018 01/19/2016 03/11/2015 05/01/2014 03/14/2014 03/14/2014  Does Patient Have a Medical Advance Directive? No No No No Yes No No  Type of Advance Directive - - - - Living will - -  Does patient want to make changes to medical advance directive? - - - - No - Patient declined - -  Would patient like information on creating a medical advance directive? No - Patient declined No - Patient declined Yes - Scientist, clinical (histocompatibility and immunogenetics) given Yes - Scientist, clinical (histocompatibility and immunogenetics) given - - No - patient declined information    Hospital Utilization Over the Past 12 Months: # of  hospitalizations or ER visits: 1 # of surgeries: 0  Review of Systems    Patient reports that his overall health is unchanged compared to last year.  History obtained from chart review  Patient Reported Readings (BP, Pulse, CBG, Weight, etc) none  Pain Assessment Pain : No/denies pain     Current Medications & Allergies (verified) Allergies as of 01/29/2019   No Known Allergies     Medication List       Accurate as of January 29, 2019 10:14 AM. If you have any questions, ask your nurse or doctor.        STOP taking these medications   hydrOXYzine 10 MG tablet Commonly known as: ATARAX/VISTARIL   sulfamethoxazole-trimethoprim 800-160 MG tablet Commonly known as: BACTRIM DS   triamcinolone cream 0.1 % Commonly known as: KENALOG     TAKE these medications   amLODipine 2.5 MG tablet Commonly known as: NORVASC Take 1 tablet (2.5 mg total) by mouth daily.   bacitracin-polymyxin b ophthalmic ointment Commonly known as: POLYSPORIN Place 1 application into the left eye 4 (four) times daily. apply to eye every 12 hours while awake   fexofenadine-pseudoephedrine 60-120 MG 12 hr tablet Commonly known as: ALLEGRA-D Take 1 po bid   montelukast 10 MG tablet Commonly known as: SINGULAIR Take 1 tablet (10 mg total) by mouth daily. (Please make 6 mos appt)   Olopatadine HCl 0.2 % Soln Commonly known as: Pataday INSTILL 1 DROP IN EACH EYE TWICE DAILY   pantoprazole 40 MG tablet Commonly known as: PROTONIX Take 1 tablet (40 mg total) by mouth daily. (Please make 6 mos appt)   ProAir HFA 108 (90 Base) MCG/ACT inhaler Generic  drug: albuterol Inhale 2 puffs into the lungs every 6 (six) hours as needed for wheezing.   simvastatin 10 MG tablet Commonly known as: ZOCOR Take 1 tablet (10 mg total) by mouth at bedtime.       History (reviewed): Past Medical History:  Diagnosis Date  . Allergy   . Asthma   . Cataract   . Cataract   . Diverticulitis   . GERD  (gastroesophageal reflux disease)   . Hyperlipidemia   . Hypertension    Past Surgical History:  Procedure Laterality Date  . bilateral inguinal hernia    . COLONOSCOPY  06/09/2004   [id number] colon diverticulosis, otherwise normal colonoscopy  . EYE SURGERY  08/2014  . HERNIA REPAIR    . PALATE / UVULA BIOPSY / EXCISION    . TONSILLECTOMY    . VASECTOMY     Family History  Problem Relation Age of Onset  . Heart disease Mother   . Hip fracture Mother 61  . Osteoporosis Mother   . Dementia Mother 38  . Arthritis Father   . Heart disease Father   . Arthritis Sister   . Arthritis Brother   . Colon cancer Neg Hx    Social History   Socioeconomic History  . Marital status: Married    Spouse name: Wells Guiles  . Number of children: 2  . Years of education: 2  . Highest education level: Some college, no degree  Occupational History  . 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 Financial resource strain: Not hard at all  . Food insecurity    Worry: Never true    Inability: Never true  . Transportation needs    Medical: No    Non-medical: No  Tobacco Use  . Smoking status: Former Smoker    Packs/day: 4.00    Years: 25.00    Pack years: 100.00    Types: Cigarettes    Quit date: 06/23/1982    Years since quitting: 36.6  . Smokeless tobacco: Never Used  . 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 Alcohol use: Yes    Alcohol/week: 2.0 standard drinks    Types: 2 Shots of liquor per week    Comment: brandy a few times a month, beer once a month  . Drug use: No  . Sexual activity: Yes    Birth control/protection: Surgical  Lifestyle  . Physical activity    Days per week: 7 days    Minutes per session: 60 min  . Stress: Not at all  Relationships  . Social connections    Talks on phone: More than three times a week    Gets together: More than three times a week    Attends religious  service: More than 4 times per year    Active member of club or organization: Yes    Attends meetings of clubs or organizations: More than 4 times per year    Relationship status: Married  Other Topics Concern  . Not on file  Social History Narrative  . Not on file    Activities of Daily Living In your present state of health, do you have any difficulty performing the following activities: 01/29/2019  Hearing? Y  Comment says he is more "hard of hearing" lately-last evaluated 5 years ago  Vision? N  Comment wears glasses-gets yearly eye exam  Difficulty concentrating or making decisions? N  Walking or climbing stairs? N  Dressing or bathing? N  Doing errands, shopping? N  Preparing Food and eating ? N  Using the Toilet? N  In the past six months, have you accidently leaked urine? N  Do you have problems with loss of bowel control? N  Managing your Medications? N  Managing your Finances? N  Housekeeping or managing your Housekeeping? N  Some recent data might be hidden    Patient Education/ Literacy How often do you need to have someone help you when you read instructions, pamphlets, or other written materials from your doctor or pharmacy?: 1 - Never What is the last grade level you completed in school?: Some college-No degree  Exercise Current Exercise Habits: Home exercise routine, Type of exercise: walking, Time (Minutes): 60, Frequency (Times/Week): 7, Weekly Exercise (Minutes/Week): 420, Intensity: Moderate, Exercise limited by: None identified  Diet Patient reports consuming 2 meals a day and 2 snack(s) a day Patient reports that his primary diet is: Regular Patient reports that she does have regular access to food.   Depression Screen PHQ 2/9 Scores 01/29/2019 08/28/2018 08/13/2018 08/08/2018 06/20/2018 05/16/2018 04/25/2018  PHQ - 2 Score 0 0 0 0 0 0 0  PHQ- 9 Score - - - - - - -     Fall Risk Fall Risk  01/29/2019 08/28/2018 08/13/2018 08/08/2018 05/16/2018  Falls in the  past year? 0 0 0 0 0  Comment - - - - -  Number falls in past yr: 0 - - - -  Injury with Fall? 0 - - - -  Comment - - - - -  Follow up Falls prevention discussed - - - -  Comment Get rid of all throw rugs in the house, adequate lighting in the walkways  and grab bars in the bathroom - - - -     Objective:  [name] seemed alert and oriented and he participated appropriately during our telephone visit.  Blood Pressure Weight BMI  BP Readings from Last 3 Encounters:  09/17/18 (!) 142/75  08/28/18 134/87  08/21/18 132/76   Wt Readings from Last 3 Encounters:  09/17/18 187 lb 6.4 oz (85 kg)  08/28/18 188 lb (85.3 kg)  08/21/18 189 lb (85.7 kg)   BMI Readings from Last 1 Encounters:  09/17/18 27.67 kg/m    *Unable to obtain current vital signs, weight, and BMI due to telephone visit type  Hearing/Vision  . [place]ston did not seem to have difficulty with hearing/understanding during the telephone conversation . Reports that he has had a formal eye exam by an eye care professional within the past year . Reports that he has not had a formal hearing evaluation within the past year *Unable to fully assess hearing and vision during telephone visit type  Cognitive Function: 6CIT Screen 01/29/2019  What Year? 0 points  What month? 0 points  What time? 0 points  Count back from 20 0 points  Months in reverse 0 points  Repeat phrase 0 points  Total Score 0   (Normal:0-7, Significant for Dysfunction: >8)  Normal Cognitive Function Screening: Yes   Immunization & Health Maintenance Record Immunization History  Administered Date(s) Administered  . Influenza, High Dose Seasonal PF 01/23/2018  . Influenza,inj,Quad PF,6+ Mos 01/14/2013, 03/14/2014, 03/11/2015, 01/19/2016, 01/18/2017  . Pneumococcal Conjugate-13 03/14/2014  . Pneumococcal Polysaccharide-23 01/14/2013  . Zoster 06/14/2011    Health Maintenance  Topic Date Due  . TETANUS/TDAP  04/05/2015  . INFLUENZA VACCINE   11/03/2018  .  PNA vac Low Risk Adult  Completed       Assessment  This is a routine wellness examination for [name].  Health Maintenance: Due or Overdue Health Maintenance Due  Topic Date Due  . TETANUS/TDAP  04/05/2015  . INFLUENZA VACCINE  11/03/2018    [name] does not need a referral for Community Assistance: Care Management:   no Social Work:    no Prescription Assistance:  no Nutrition/Diabetes Education:  no   Plan:  Personalized Goals Goals Addressed            This Visit's Progress   . DIET - EAT MORE FRUITS AND VEGETABLES        Personalized Health Maintenance & Screening Recommendations  Influenza vaccine Td vaccine  Lung Cancer Screening Recommended: no (Low Dose CT Chest recommended if Age 6-80 years, 30 pack-year currently smoking OR have quit w/in past 15 years) Hepatitis C Screening recommended: no HIV Screening recommended: no  Advanced Directives: Written information was not prepared per patient's request.  Referrals & Orders No orders of the defined types were placed in this encounter.   Follow-up Plan . Follow-up with Dettinger, Fransisca Kaufmann, MD as planned . Consider Flu and TDAP vaccines at your next visit with your PCP   I have personally reviewed and noted the following in the patient's chart:   . Medical and social history . Use of alcohol, tobacco or illicit drugs  . Current medications and supplements . Functional ability and status . Nutritional status . Physical activity . Advanced directives . List of other physicians . Hospitalizations, surgeries, and ER visits in previous 12 months . Vitals . Screenings to include cognitive, depression, and falls . Referrals and appointments  In addition, I have reviewed and discussed with [name] certain preventive protocols, quality metrics, and best practice recommendations. A written personalized care plan for preventive services as well as general preventive  health recommendations is available and can be mailed to the patient at his request.      Milas Hock, LPN  [id number]

## 2019-01-29 NOTE — Patient Instructions (Signed)
Preventive Care 79 Years and Older, Male Preventive care refers to lifestyle choices and visits with your health care provider that can promote health and wellness. This includes:  A yearly physical exam. This is also called an annual well check.  Regular dental and eye exams.  Immunizations.  Screening for certain conditions.  Healthy lifestyle choices, such as diet and exercise. What can I expect for my preventive care visit? Physical exam Your health care provider will check:  Height and weight. These may be used to calculate body mass index (BMI), which is a measurement that tells if you are at a healthy weight.  Heart rate and blood pressure.  Your skin for abnormal spots. Counseling Your health care provider may ask you questions about:  Alcohol, tobacco, and drug use.  Emotional well-being.  Home and relationship well-being.  Sexual activity.  Eating habits.  History of falls.  Memory and ability to understand (cognition).  Work and work Statistician. What immunizations do I need?  Influenza (flu) vaccine  This is recommended every year. Tetanus, diphtheria, and pertussis (Tdap) vaccine  You may need a Td booster every 10 years. Varicella (chickenpox) vaccine  You may need this vaccine if you have not already been vaccinated. Zoster (shingles) vaccine  You may need this after age 50. Pneumococcal conjugate (PCV13) vaccine  One dose is recommended after age 24. Pneumococcal polysaccharide (PPSV23) vaccine  One dose is recommended after age 33. Measles, mumps, and rubella (MMR) vaccine  You may need at least one dose of MMR if you were born in 1957 or later. You may also need a second dose. Meningococcal conjugate (MenACWY) vaccine  You may need this if you have certain conditions. Hepatitis A vaccine  You may need this if you have certain conditions or if you travel or work in places where you may be exposed to hepatitis A. Hepatitis B vaccine   You may need this if you have certain conditions or if you travel or work in places where you may be exposed to hepatitis B. Haemophilus influenzae type b (Hib) vaccine  You may need this if you have certain conditions. You may receive vaccines as individual doses or as more than one vaccine together in one shot (combination vaccines). Talk with your health care provider about the risks and benefits of combination vaccines. What tests do I need? Blood tests  Lipid and cholesterol levels. These may be checked every 5 years, or more frequently depending on your overall health.  Hepatitis C test.  Hepatitis B test. Screening  Lung cancer screening. You may have this screening every year starting at age 74 if you have a 30-pack-year history of smoking and currently smoke or have quit within the past 15 years.  Colorectal cancer screening. All adults should have this screening starting at age 57 and continuing until age 54. Your health care provider may recommend screening at age 47 if you are at increased risk. You will have tests every 1-10 years, depending on your results and the type of screening test.  Prostate cancer screening. Recommendations will vary depending on your family history and other risks.  Diabetes screening. This is done by checking your blood sugar (glucose) after you have not eaten for a while (fasting). You may have this done every 1-3 years.  Abdominal aortic aneurysm (AAA) screening. You may need this if you are a current or former smoker.  Sexually transmitted disease (STD) testing. Follow these instructions at home: Eating and drinking  Eat  a diet that includes fresh fruits and vegetables, whole grains, lean protein, and low-fat dairy products. Limit your intake of foods with high amounts of sugar, saturated fats, and salt.  Take vitamin and mineral supplements as recommended by your health care provider.  Do not drink alcohol if your health care provider  tells you not to drink.  If you drink alcohol: ? Limit how much you have to 0-2 drinks a day. ? Be aware of how much alcohol is in your drink. In the [place] one drink equals one 12 oz bottle of beer (355 mL), one 5 oz glass of wine (148 mL), or one 1 oz glass of hard liquor (44 mL). Lifestyle  Take daily care of your teeth and gums.  Stay active. Exercise for at least 30 minutes on 5 or more days each week.  Do not use any products that contain nicotine or tobacco, such as cigarettes, e-cigarettes, and chewing tobacco. If you need help quitting, ask your health care provider.  If you are sexually active, practice safe sex. Use a condom or other form of protection to prevent STIs (sexually transmitted infections).  Talk with your health care provider about taking a low-dose aspirin or statin. What's next?  Visit your health care provider once a year for a well check visit.  Ask your health care provider how often you should have your eyes and teeth checked.  Stay up to date on all vaccines. This information is not intended to replace advice given to you by your health care provider. Make sure you discuss any questions you have with your health care provider. Document Released: 04/17/2015 Document Revised: 03/15/2018 Document Reviewed: 03/15/2018 Elsevier Patient Education  2020 Elsevier Inc.  

## 2019-02-12 ENCOUNTER — Other Ambulatory Visit: Payer: Self-pay

## 2019-02-13 ENCOUNTER — Ambulatory Visit (INDEPENDENT_AMBULATORY_CARE_PROVIDER_SITE_OTHER): Payer: Medicare Other

## 2019-02-13 DIAGNOSIS — Z23 Encounter for immunization: Secondary | ICD-10-CM | POA: Diagnosis not present

## 2019-02-22 ENCOUNTER — Other Ambulatory Visit: Payer: Self-pay

## 2019-02-22 DIAGNOSIS — Z20822 Contact with and (suspected) exposure to covid-19: Secondary | ICD-10-CM

## 2019-02-25 LAB — NOVEL CORONAVIRUS, NAA: SARS-CoV-2, NAA: NOT DETECTED

## 2019-03-05 ENCOUNTER — Other Ambulatory Visit: Payer: Self-pay | Admitting: Family Medicine

## 2019-04-10 ENCOUNTER — Other Ambulatory Visit: Payer: Self-pay | Admitting: Family Medicine

## 2019-04-10 DIAGNOSIS — E782 Mixed hyperlipidemia: Secondary | ICD-10-CM

## 2019-05-06 DIAGNOSIS — L821 Other seborrheic keratosis: Secondary | ICD-10-CM | POA: Diagnosis not present

## 2019-05-06 DIAGNOSIS — L814 Other melanin hyperpigmentation: Secondary | ICD-10-CM | POA: Diagnosis not present

## 2019-05-06 DIAGNOSIS — D225 Melanocytic nevi of trunk: Secondary | ICD-10-CM | POA: Diagnosis not present

## 2019-05-06 DIAGNOSIS — L820 Inflamed seborrheic keratosis: Secondary | ICD-10-CM | POA: Diagnosis not present

## 2019-05-17 ENCOUNTER — Encounter: Payer: Self-pay | Admitting: Family Medicine

## 2019-05-17 ENCOUNTER — Other Ambulatory Visit: Payer: Self-pay

## 2019-05-17 ENCOUNTER — Ambulatory Visit (INDEPENDENT_AMBULATORY_CARE_PROVIDER_SITE_OTHER): Payer: Medicare Other | Admitting: Family Medicine

## 2019-05-17 VITALS — BP 129/80 | HR 73 | Temp 98.7°F | Ht 69.0 in | Wt 195.6 lb

## 2019-05-17 DIAGNOSIS — E782 Mixed hyperlipidemia: Secondary | ICD-10-CM | POA: Diagnosis not present

## 2019-05-17 DIAGNOSIS — I10 Essential (primary) hypertension: Secondary | ICD-10-CM

## 2019-05-17 DIAGNOSIS — K219 Gastro-esophageal reflux disease without esophagitis: Secondary | ICD-10-CM

## 2019-05-17 DIAGNOSIS — N400 Enlarged prostate without lower urinary tract symptoms: Secondary | ICD-10-CM | POA: Diagnosis not present

## 2019-05-17 MED ORDER — SIMVASTATIN 10 MG PO TABS: 10.0000 mg | ORAL_TABLET | Freq: Every day | ORAL | 3 refills | Status: DC

## 2019-05-17 MED ORDER — MONTELUKAST SODIUM 10 MG PO TABS: ORAL_TABLET | ORAL | 3 refills | Status: DC

## 2019-05-17 MED ORDER — PANTOPRAZOLE SODIUM 40 MG PO TBEC: DELAYED_RELEASE_TABLET | ORAL | 3 refills | Status: DC

## 2019-05-17 MED ORDER — AMLODIPINE BESYLATE 2.5 MG PO TABS: 2.5000 mg | ORAL_TABLET | Freq: Every day | ORAL | 3 refills | Status: DC

## 2019-05-17 NOTE — Progress Notes (Signed)
BP 129/80   Pulse 73   Temp 98.7 F (37.1 C) (Temporal)   Ht 5' 9"  (1.753 m)   Wt 195 lb 9.6 oz (88.7 kg)   SpO2 98%   BMI 28.89 kg/m    Subjective:   Patient ID: Patrick Bell, male    DOB: 05-Jun-1940, 79 y.o.   MRN: [id number]  HPI: Patrick Bell is a 79 y.o. male presenting on 05/17/2019 for Establish Care (check up of chronic medical conditions)   HPI  Hypertension Patient is currently on no medication currently, and their blood pressure today is 129/80. Patient denies any lightheadedness or dizziness. Patient denies headaches, blurred vision, chest pains, shortness of breath, or weakness. Denies any side effects from medication and is content with current medication.   Hyperlipidemia Patient is coming in for recheck of his hyperlipidemia. The patient is currently taking simvastatin. They deny any issues with myalgias or history of liver damage from it. They deny any focal numbness or weakness or chest pain.   GERD Patient is currently on Protonix.  She denies any major symptoms or abdominal pain or belching or burping. She denies any blood in her stool or lightheadedness or dizziness.   BPH Patient is coming in for recheck on BPH Symptoms: Urinary frequency Medication: No medications Last PSA: 5.6 on 08/2016  Relevant past medical, surgical, family and social history reviewed and updated as indicated. Interim medical history since our last visit reviewed. Allergies and medications reviewed and updated.  Review of Systems  Constitutional: Negative for chills and fever.  Respiratory: Negative for shortness of breath and wheezing.   Cardiovascular: Negative for chest pain and leg swelling.  Gastrointestinal: Negative for abdominal pain.  Genitourinary: Positive for frequency and urgency. Negative for decreased urine volume, difficulty urinating, flank pain and hematuria.  Musculoskeletal: Negative for back pain and gait problem.  Skin: Negative for rash.    Neurological: Negative for dizziness.  All other systems reviewed and are negative.   Per HPI unless specifically indicated above   Allergies as of 05/17/2019   No Known Allergies     Medication List       Accurate as of May 17, 2019  9:50 AM. If you have any questions, ask your nurse or doctor.        STOP taking these medications   amLODipine 2.5 MG tablet Commonly known as: NORVASC Stopped by: Fransisca Kaufmann Bascom Biel, MD   bacitracin-polymyxin b ophthalmic ointment Commonly known as: POLYSPORIN Stopped by: Fransisca Kaufmann Roen Macgowan, MD     TAKE these medications   fexofenadine-pseudoephedrine 60-120 MG 12 hr tablet Commonly known as: ALLEGRA-D Take 1 po bid   montelukast 10 MG tablet Commonly known as: SINGULAIR Take 1 tablet (10 mg total) daily by mouth.   Olopatadine HCl 0.2 % Soln Commonly known as: Pataday INSTILL 1 DROP IN EACH EYE TWICE DAILY   pantoprazole 40 MG tablet Commonly known as: PROTONIX Take 1 tablet (40 mg total) daily by mouth.   ProAir HFA 108 (90 Base) MCG/ACT inhaler Generic drug: albuterol Inhale 2 puffs into the lungs every 6 (six) hours as needed for wheezing.   simvastatin 10 MG tablet Commonly known as: ZOCOR Take 1 tablet (10 mg total) by mouth at bedtime. What changed: additional instructions Changed by: Fransisca Kaufmann Quincy Prisco, MD        Objective:   BP 129/80   Pulse 73   Temp 98.7 F (37.1 C) (Temporal)   Ht 5' 9"  (1.753 m)  Wt 195 lb 9.6 oz (88.7 kg)   SpO2 98%   BMI 28.89 kg/m   Wt Readings from Last 3 Encounters:  05/17/19 195 lb 9.6 oz (88.7 kg)  09/17/18 187 lb 6.4 oz (85 kg)  08/28/18 188 lb (85.3 kg)    Physical Exam Vitals and nursing note reviewed.  Constitutional:      General: He is not in acute distress.    Appearance: He is well-developed. He is not diaphoretic.  Eyes:     General: No scleral icterus.    Conjunctiva/sclera: Conjunctivae normal.  Neck:     Thyroid: No thyromegaly.   Cardiovascular:     Rate and Rhythm: Normal rate and regular rhythm.     Heart sounds: Normal heart sounds. No murmur.  Pulmonary:     Effort: Pulmonary effort is normal. No respiratory distress.     Breath sounds: Normal breath sounds. No wheezing.  Musculoskeletal:     Cervical back: Neck supple.  Lymphadenopathy:     Cervical: No cervical adenopathy.  Skin:    General: Skin is warm and dry.     Findings: No rash.  Neurological:     Mental Status: He is alert and oriented to person, place, and time.     Coordination: Coordination normal.  Psychiatric:        Behavior: Behavior normal.       Assessment & Plan:   Problem List Items Addressed This Visit      Cardiovascular and Mediastinum   Essential hypertension, benign   Relevant Medications   simvastatin (ZOCOR) 10 MG tablet   Other Relevant Orders   CMP14+EGFR     Digestive   GERD (gastroesophageal reflux disease) - Primary   Relevant Medications   pantoprazole (PROTONIX) 40 MG tablet   Other Relevant Orders   CBC with Differential/Platelet     Genitourinary   BPH (benign prostatic hyperplasia)   Relevant Orders   PSA, total and free     Other   Hyperlipidemia   Relevant Medications   simvastatin (ZOCOR) 10 MG tablet   Other Relevant Orders   Lipid panel      Patient has some indigestion and increased acid and also irritable bowel symptoms, he had a colonoscopy just a year or 2 ago.  He denies any blood in his stool.  Recommended MiraLAX to try to help flush the system out and taken a probiotic and see if that helps from there.  Patient does have some urinary frequency and decreased flow, discussed Flomax but he would like to hold off on it for now.  Patient is not currently taking the amlodipine and had dizziness when he did take it and blood pressure is 129/80 so we will hold off on taking it. Follow up plan: Return in about 6 months (around 11/14/2019), or if symptoms worsen or fail to improve, for  Recheck cholesterol and hypertension.  Counseling provided for all of the vaccine components Orders Placed This Encounter  Procedures  . CBC with Differential/Platelet  . CMP14+EGFR  . Lipid panel  . PSA, total and free    Caryl Pina, MD [facility] 05/17/2019, 9:50 AM

## 2019-05-18 LAB — CBC WITH DIFFERENTIAL/PLATELET
Basophils Absolute: 0.1 10*3/uL (ref 0.0–0.2)
Basos: 2 %
EOS (ABSOLUTE): 0.1 10*3/uL (ref 0.0–0.4)
Eos: 1 %
Hematocrit: 48.1 % (ref 37.5–51.0)
Hemoglobin: 16 g/dL (ref 13.0–17.7)
Immature Grans (Abs): 0 10*3/uL (ref 0.0–0.1)
Immature Granulocytes: 1 %
Lymphocytes Absolute: 0.8 10*3/uL (ref 0.7–3.1)
Lymphs: 18 %
MCH: 29.1 pg (ref 26.6–33.0)
MCHC: 33.3 g/dL (ref 31.5–35.7)
MCV: 88 fL (ref 79–97)
Monocytes Absolute: 0.4 10*3/uL (ref 0.1–0.9)
Monocytes: 9 %
Neutrophils Absolute: 3.1 10*3/uL (ref 1.4–7.0)
Neutrophils: 69 %
Platelets: 202 10*3/uL (ref 150–450)
RBC: 5.49 x10E6/uL (ref 4.14–5.80)
RDW: 12.8 % (ref 11.6–15.4)
WBC: 4.5 10*3/uL (ref 3.4–10.8)

## 2019-05-18 LAB — LIPID PANEL
Chol/HDL Ratio: 2.2 ratio (ref 0.0–5.0)
Cholesterol, Total: 177 mg/dL (ref 100–199)
HDL: 80 mg/dL (ref >39)
LDL Chol Calc (NIH): 82 mg/dL (ref 0–99)
Triglycerides: 81 mg/dL (ref 0–149)
VLDL Cholesterol Cal: 15 mg/dL (ref 5–40)

## 2019-05-18 LAB — CMP14+EGFR
ALT: 8 IU/L (ref 0–44)
AST: 18 IU/L (ref 0–40)
Albumin/Globulin Ratio: 2.1 (ref 1.2–2.2)
Albumin: 4.2 g/dL (ref 3.7–4.7)
Alkaline Phosphatase: 61 IU/L (ref 39–117)
BUN/Creatinine Ratio: 13 (ref 10–24)
BUN: 12 mg/dL (ref 8–27)
Bilirubin Total: 0.7 mg/dL (ref 0.0–1.2)
CO2: 23 mmol/L (ref 20–29)
Calcium: 9.2 mg/dL (ref 8.6–10.2)
Chloride: 103 mmol/L (ref 96–106)
Creatinine, Ser: 0.89 mg/dL (ref 0.76–1.27)
GFR calc Af Amer: 94 mL/min/{1.73_m2} (ref >59)
GFR calc non Af Amer: 81 mL/min/{1.73_m2} (ref >59)
Globulin, Total: 2 g/dL (ref 1.5–4.5)
Glucose: 82 mg/dL (ref 65–99)
Potassium: 4.5 mmol/L (ref 3.5–5.2)
Sodium: 139 mmol/L (ref 134–144)
Total Protein: 6.2 g/dL (ref 6.0–8.5)

## 2019-05-18 LAB — PSA, TOTAL AND FREE
PSA, Free Pct: 17.3 %
PSA, Free: 0.57 ng/mL
Prostate Specific Ag, Serum: 3.3 ng/mL (ref 0.0–4.0)

## 2019-05-20 ENCOUNTER — Telehealth: Payer: Self-pay | Admitting: Family Medicine

## 2019-05-20 NOTE — Chronic Care Management (AMB) (Signed)
  Chronic Care Management   Note  05/20/2019 Name: Patrick Bell MRN: [id number] DOB: 12/08/1940  [name] is a 79 y.o. year old male who is a primary care patient of Dettinger, Fransisca Kaufmann, MD. I reached out to [name] by phone today in response to a referral sent by [name] health plan.     [name] was given information about Chronic Care Management services today including:  1. CCM service includes personalized support from designated clinical staff supervised by his physician, including individualized plan of care and coordination with other care providers 2. 24/7 contact phone numbers for assistance for urgent and routine care needs. 3. Service will only be billed when office clinical staff spend 20 minutes or more in a month to coordinate care. 4. Only one practitioner may furnish and bill the service in a calendar month. 5. The patient may stop CCM services at any time (effective at the end of the month) by phone call to the office staff. 6. The patient will be responsible for cost sharing (co-pay) of up to 20% of the service fee (after annual deductible is met).  Patient did not agree to enrollment in care management services and does not wish to consider at this time.  Follow up plan: The care management team is available to follow up with the patient after provider conversation with the patient regarding recommendation for care management engagement and subsequent re-referral to the care management team.   Noreene Larsson, [place], [place], [place] [address] Direct Dial: [phone] [email or web address] Website: [email or web address]

## 2019-06-10 DIAGNOSIS — H527 Unspecified disorder of refraction: Secondary | ICD-10-CM | POA: Diagnosis not present

## 2019-06-10 DIAGNOSIS — H25811 Combined forms of age-related cataract, right eye: Secondary | ICD-10-CM | POA: Diagnosis not present

## 2019-06-10 DIAGNOSIS — H1045 Other chronic allergic conjunctivitis: Secondary | ICD-10-CM | POA: Diagnosis not present

## 2019-06-10 DIAGNOSIS — H0100B Unspecified blepharitis left eye, upper and lower eyelids: Secondary | ICD-10-CM | POA: Diagnosis not present

## 2019-06-10 DIAGNOSIS — H0100A Unspecified blepharitis right eye, upper and lower eyelids: Secondary | ICD-10-CM | POA: Diagnosis not present

## 2019-06-10 DIAGNOSIS — D3132 Benign neoplasm of left choroid: Secondary | ICD-10-CM | POA: Diagnosis not present

## 2019-07-10 ENCOUNTER — Encounter: Payer: Self-pay | Admitting: Family Medicine

## 2019-07-10 DIAGNOSIS — H25811 Combined forms of age-related cataract, right eye: Secondary | ICD-10-CM | POA: Diagnosis not present

## 2019-07-10 DIAGNOSIS — H52201 Unspecified astigmatism, right eye: Secondary | ICD-10-CM | POA: Diagnosis not present

## 2019-07-10 DIAGNOSIS — H527 Unspecified disorder of refraction: Secondary | ICD-10-CM | POA: Diagnosis not present

## 2019-07-30 DIAGNOSIS — I10 Essential (primary) hypertension: Secondary | ICD-10-CM | POA: Diagnosis not present

## 2019-07-30 DIAGNOSIS — H25811 Combined forms of age-related cataract, right eye: Secondary | ICD-10-CM | POA: Insufficient documentation

## 2019-07-30 DIAGNOSIS — E785 Hyperlipidemia, unspecified: Secondary | ICD-10-CM | POA: Diagnosis not present

## 2019-07-30 DIAGNOSIS — Z87891 Personal history of nicotine dependence: Secondary | ICD-10-CM | POA: Diagnosis not present

## 2019-07-30 DIAGNOSIS — K219 Gastro-esophageal reflux disease without esophagitis: Secondary | ICD-10-CM | POA: Diagnosis not present

## 2019-07-31 DIAGNOSIS — H52201 Unspecified astigmatism, right eye: Secondary | ICD-10-CM | POA: Diagnosis not present

## 2019-07-31 DIAGNOSIS — Z961 Presence of intraocular lens: Secondary | ICD-10-CM | POA: Diagnosis not present

## 2019-08-19 DIAGNOSIS — M9904 Segmental and somatic dysfunction of sacral region: Secondary | ICD-10-CM | POA: Diagnosis not present

## 2019-08-19 DIAGNOSIS — M9903 Segmental and somatic dysfunction of lumbar region: Secondary | ICD-10-CM | POA: Diagnosis not present

## 2019-08-19 DIAGNOSIS — M5137 Other intervertebral disc degeneration, lumbosacral region: Secondary | ICD-10-CM | POA: Diagnosis not present

## 2019-08-19 DIAGNOSIS — M9905 Segmental and somatic dysfunction of pelvic region: Secondary | ICD-10-CM | POA: Diagnosis not present

## 2019-08-21 DIAGNOSIS — M9905 Segmental and somatic dysfunction of pelvic region: Secondary | ICD-10-CM | POA: Diagnosis not present

## 2019-08-21 DIAGNOSIS — M5137 Other intervertebral disc degeneration, lumbosacral region: Secondary | ICD-10-CM | POA: Diagnosis not present

## 2019-08-21 DIAGNOSIS — M9904 Segmental and somatic dysfunction of sacral region: Secondary | ICD-10-CM | POA: Diagnosis not present

## 2019-08-21 DIAGNOSIS — M9903 Segmental and somatic dysfunction of lumbar region: Secondary | ICD-10-CM | POA: Diagnosis not present

## 2019-08-22 DIAGNOSIS — M5137 Other intervertebral disc degeneration, lumbosacral region: Secondary | ICD-10-CM | POA: Diagnosis not present

## 2019-08-22 DIAGNOSIS — H26491 Other secondary cataract, right eye: Secondary | ICD-10-CM | POA: Diagnosis not present

## 2019-08-22 DIAGNOSIS — M9904 Segmental and somatic dysfunction of sacral region: Secondary | ICD-10-CM | POA: Diagnosis not present

## 2019-08-22 DIAGNOSIS — M9903 Segmental and somatic dysfunction of lumbar region: Secondary | ICD-10-CM | POA: Diagnosis not present

## 2019-08-22 DIAGNOSIS — Z961 Presence of intraocular lens: Secondary | ICD-10-CM | POA: Diagnosis not present

## 2019-08-22 DIAGNOSIS — M9905 Segmental and somatic dysfunction of pelvic region: Secondary | ICD-10-CM | POA: Diagnosis not present

## 2019-08-26 DIAGNOSIS — M9905 Segmental and somatic dysfunction of pelvic region: Secondary | ICD-10-CM | POA: Diagnosis not present

## 2019-08-26 DIAGNOSIS — M9904 Segmental and somatic dysfunction of sacral region: Secondary | ICD-10-CM | POA: Diagnosis not present

## 2019-08-26 DIAGNOSIS — M5137 Other intervertebral disc degeneration, lumbosacral region: Secondary | ICD-10-CM | POA: Diagnosis not present

## 2019-08-26 DIAGNOSIS — M9903 Segmental and somatic dysfunction of lumbar region: Secondary | ICD-10-CM | POA: Diagnosis not present

## 2019-08-27 DIAGNOSIS — Z961 Presence of intraocular lens: Secondary | ICD-10-CM | POA: Diagnosis not present

## 2019-08-28 DIAGNOSIS — M9903 Segmental and somatic dysfunction of lumbar region: Secondary | ICD-10-CM | POA: Diagnosis not present

## 2019-08-28 DIAGNOSIS — M5137 Other intervertebral disc degeneration, lumbosacral region: Secondary | ICD-10-CM | POA: Diagnosis not present

## 2019-08-28 DIAGNOSIS — M9905 Segmental and somatic dysfunction of pelvic region: Secondary | ICD-10-CM | POA: Diagnosis not present

## 2019-08-28 DIAGNOSIS — M9904 Segmental and somatic dysfunction of sacral region: Secondary | ICD-10-CM | POA: Diagnosis not present

## 2019-08-29 DIAGNOSIS — M9904 Segmental and somatic dysfunction of sacral region: Secondary | ICD-10-CM | POA: Diagnosis not present

## 2019-08-29 DIAGNOSIS — M9905 Segmental and somatic dysfunction of pelvic region: Secondary | ICD-10-CM | POA: Diagnosis not present

## 2019-08-29 DIAGNOSIS — M9903 Segmental and somatic dysfunction of lumbar region: Secondary | ICD-10-CM | POA: Diagnosis not present

## 2019-08-29 DIAGNOSIS — M5137 Other intervertebral disc degeneration, lumbosacral region: Secondary | ICD-10-CM | POA: Diagnosis not present

## 2019-09-04 DIAGNOSIS — M9904 Segmental and somatic dysfunction of sacral region: Secondary | ICD-10-CM | POA: Diagnosis not present

## 2019-09-04 DIAGNOSIS — M5137 Other intervertebral disc degeneration, lumbosacral region: Secondary | ICD-10-CM | POA: Diagnosis not present

## 2019-09-04 DIAGNOSIS — M9905 Segmental and somatic dysfunction of pelvic region: Secondary | ICD-10-CM | POA: Diagnosis not present

## 2019-09-04 DIAGNOSIS — M9903 Segmental and somatic dysfunction of lumbar region: Secondary | ICD-10-CM | POA: Diagnosis not present

## 2019-09-11 ENCOUNTER — Encounter: Payer: Self-pay | Admitting: Physician Assistant

## 2019-09-11 ENCOUNTER — Other Ambulatory Visit: Payer: Self-pay

## 2019-09-11 ENCOUNTER — Ambulatory Visit (INDEPENDENT_AMBULATORY_CARE_PROVIDER_SITE_OTHER): Payer: Medicare Other | Admitting: Physician Assistant

## 2019-09-11 VITALS — BP 157/81 | HR 65 | Temp 97.7°F | Resp 20 | Ht 69.0 in | Wt 187.0 lb

## 2019-09-11 DIAGNOSIS — M5137 Other intervertebral disc degeneration, lumbosacral region: Secondary | ICD-10-CM | POA: Diagnosis not present

## 2019-09-11 DIAGNOSIS — W57XXXA Bitten or stung by nonvenomous insect and other nonvenomous arthropods, initial encounter: Secondary | ICD-10-CM

## 2019-09-11 DIAGNOSIS — M9905 Segmental and somatic dysfunction of pelvic region: Secondary | ICD-10-CM | POA: Diagnosis not present

## 2019-09-11 DIAGNOSIS — M9903 Segmental and somatic dysfunction of lumbar region: Secondary | ICD-10-CM | POA: Diagnosis not present

## 2019-09-11 DIAGNOSIS — M9904 Segmental and somatic dysfunction of sacral region: Secondary | ICD-10-CM | POA: Diagnosis not present

## 2019-09-11 DIAGNOSIS — R5383 Other fatigue: Secondary | ICD-10-CM

## 2019-09-11 NOTE — Patient Instructions (Signed)

## 2019-09-11 NOTE — Progress Notes (Signed)
  Subjective:     Patient ID: QUINTON VOTH, male   DOB: 02/14/1941, 79 y.o.   MRN: [id number]  HPI Pt with multiple tick bites over the last month All ticks removed with in 24 hr period States him and wife doing nightly tick check + redness and pruritus to the areas after removal Now with fatigue and lack of energy for 3 weeks Wants to make sure not due to tick borne illness Denies and joint pain or rashes  Review of Systems  Constitutional: Positive for activity change and fatigue. Negative for appetite change, chills, diaphoresis and fever.  Musculoskeletal: Negative.   Skin: Negative.        Objective:   Physical Exam Vitals and nursing note reviewed.  Constitutional:      General: He is not in acute distress.    Appearance: Normal appearance. He is not ill-appearing or toxic-appearing.  Musculoskeletal:        General: No swelling, tenderness or deformity. Normal range of motion.  Skin:    General: Skin is warm and dry.     Findings: No rash.     Comments: Multiple bite area to the lower torso and back area reviewed No surrounding erythema, edema, or indurationseen No drainage noted  Neurological:     Mental Status: He is alert.    RMSF/Lyme pending    Assessment:     1. Tick bite, initial encounter   2. Fatigue, unspecified type        Plan:     Pt states his Delma Freeze has helped with sx so continue Will inform of lab results Continue with nightly tick checks Pt already using spray to the lower leg area F/U pending labs

## 2019-09-13 LAB — ROCKY MTN SPOTTED FVR ABS PNL(IGG+IGM)
RMSF IgG: NEGATIVE
RMSF IgM: 0.35 index (ref 0.00–0.89)

## 2019-09-13 LAB — LYME AB/WESTERN BLOT REFLEX
LYME DISEASE AB, QUANT, IGM: <0.8 index (ref 0.00–0.79)
Lyme IgG/IgM Ab: <0.91 {ISR} (ref 0.00–0.90)

## 2019-09-16 DIAGNOSIS — M5137 Other intervertebral disc degeneration, lumbosacral region: Secondary | ICD-10-CM | POA: Diagnosis not present

## 2019-09-16 DIAGNOSIS — M9905 Segmental and somatic dysfunction of pelvic region: Secondary | ICD-10-CM | POA: Diagnosis not present

## 2019-09-16 DIAGNOSIS — M9903 Segmental and somatic dysfunction of lumbar region: Secondary | ICD-10-CM | POA: Diagnosis not present

## 2019-09-16 DIAGNOSIS — M9904 Segmental and somatic dysfunction of sacral region: Secondary | ICD-10-CM | POA: Diagnosis not present

## 2019-09-19 DIAGNOSIS — M9905 Segmental and somatic dysfunction of pelvic region: Secondary | ICD-10-CM | POA: Diagnosis not present

## 2019-09-19 DIAGNOSIS — M9903 Segmental and somatic dysfunction of lumbar region: Secondary | ICD-10-CM | POA: Diagnosis not present

## 2019-09-19 DIAGNOSIS — M9904 Segmental and somatic dysfunction of sacral region: Secondary | ICD-10-CM | POA: Diagnosis not present

## 2019-09-19 DIAGNOSIS — M5137 Other intervertebral disc degeneration, lumbosacral region: Secondary | ICD-10-CM | POA: Diagnosis not present

## 2019-09-24 DIAGNOSIS — M9905 Segmental and somatic dysfunction of pelvic region: Secondary | ICD-10-CM | POA: Diagnosis not present

## 2019-09-24 DIAGNOSIS — M5137 Other intervertebral disc degeneration, lumbosacral region: Secondary | ICD-10-CM | POA: Diagnosis not present

## 2019-09-24 DIAGNOSIS — M9904 Segmental and somatic dysfunction of sacral region: Secondary | ICD-10-CM | POA: Diagnosis not present

## 2019-09-24 DIAGNOSIS — M9903 Segmental and somatic dysfunction of lumbar region: Secondary | ICD-10-CM | POA: Diagnosis not present

## 2019-10-02 DIAGNOSIS — M9904 Segmental and somatic dysfunction of sacral region: Secondary | ICD-10-CM | POA: Diagnosis not present

## 2019-10-02 DIAGNOSIS — M9905 Segmental and somatic dysfunction of pelvic region: Secondary | ICD-10-CM | POA: Diagnosis not present

## 2019-10-02 DIAGNOSIS — M9903 Segmental and somatic dysfunction of lumbar region: Secondary | ICD-10-CM | POA: Diagnosis not present

## 2019-10-02 DIAGNOSIS — M5137 Other intervertebral disc degeneration, lumbosacral region: Secondary | ICD-10-CM | POA: Diagnosis not present

## 2019-10-10 DIAGNOSIS — M9904 Segmental and somatic dysfunction of sacral region: Secondary | ICD-10-CM | POA: Diagnosis not present

## 2019-10-10 DIAGNOSIS — M9903 Segmental and somatic dysfunction of lumbar region: Secondary | ICD-10-CM | POA: Diagnosis not present

## 2019-10-10 DIAGNOSIS — M5137 Other intervertebral disc degeneration, lumbosacral region: Secondary | ICD-10-CM | POA: Diagnosis not present

## 2019-10-10 DIAGNOSIS — M9905 Segmental and somatic dysfunction of pelvic region: Secondary | ICD-10-CM | POA: Diagnosis not present

## 2019-10-16 DIAGNOSIS — M9903 Segmental and somatic dysfunction of lumbar region: Secondary | ICD-10-CM | POA: Diagnosis not present

## 2019-10-16 DIAGNOSIS — M9905 Segmental and somatic dysfunction of pelvic region: Secondary | ICD-10-CM | POA: Diagnosis not present

## 2019-10-16 DIAGNOSIS — M5137 Other intervertebral disc degeneration, lumbosacral region: Secondary | ICD-10-CM | POA: Diagnosis not present

## 2019-10-16 DIAGNOSIS — M9904 Segmental and somatic dysfunction of sacral region: Secondary | ICD-10-CM | POA: Diagnosis not present

## 2019-10-23 DIAGNOSIS — M9903 Segmental and somatic dysfunction of lumbar region: Secondary | ICD-10-CM | POA: Diagnosis not present

## 2019-10-23 DIAGNOSIS — M9904 Segmental and somatic dysfunction of sacral region: Secondary | ICD-10-CM | POA: Diagnosis not present

## 2019-10-23 DIAGNOSIS — M9905 Segmental and somatic dysfunction of pelvic region: Secondary | ICD-10-CM | POA: Diagnosis not present

## 2019-10-23 DIAGNOSIS — M5137 Other intervertebral disc degeneration, lumbosacral region: Secondary | ICD-10-CM | POA: Diagnosis not present

## 2019-10-30 DIAGNOSIS — M9903 Segmental and somatic dysfunction of lumbar region: Secondary | ICD-10-CM | POA: Diagnosis not present

## 2019-10-30 DIAGNOSIS — M9905 Segmental and somatic dysfunction of pelvic region: Secondary | ICD-10-CM | POA: Diagnosis not present

## 2019-10-30 DIAGNOSIS — M9904 Segmental and somatic dysfunction of sacral region: Secondary | ICD-10-CM | POA: Diagnosis not present

## 2019-10-30 DIAGNOSIS — M5137 Other intervertebral disc degeneration, lumbosacral region: Secondary | ICD-10-CM | POA: Diagnosis not present

## 2019-11-06 DIAGNOSIS — M9905 Segmental and somatic dysfunction of pelvic region: Secondary | ICD-10-CM | POA: Diagnosis not present

## 2019-11-06 DIAGNOSIS — M9903 Segmental and somatic dysfunction of lumbar region: Secondary | ICD-10-CM | POA: Diagnosis not present

## 2019-11-06 DIAGNOSIS — M9904 Segmental and somatic dysfunction of sacral region: Secondary | ICD-10-CM | POA: Diagnosis not present

## 2019-11-06 DIAGNOSIS — M5137 Other intervertebral disc degeneration, lumbosacral region: Secondary | ICD-10-CM | POA: Diagnosis not present

## 2019-11-13 DIAGNOSIS — M9903 Segmental and somatic dysfunction of lumbar region: Secondary | ICD-10-CM | POA: Diagnosis not present

## 2019-11-13 DIAGNOSIS — M9905 Segmental and somatic dysfunction of pelvic region: Secondary | ICD-10-CM | POA: Diagnosis not present

## 2019-11-13 DIAGNOSIS — M9904 Segmental and somatic dysfunction of sacral region: Secondary | ICD-10-CM | POA: Diagnosis not present

## 2019-11-13 DIAGNOSIS — M5137 Other intervertebral disc degeneration, lumbosacral region: Secondary | ICD-10-CM | POA: Diagnosis not present

## 2019-11-14 ENCOUNTER — Encounter: Payer: Self-pay | Admitting: Family Medicine

## 2019-11-14 ENCOUNTER — Other Ambulatory Visit: Payer: Self-pay

## 2019-11-14 ENCOUNTER — Ambulatory Visit (INDEPENDENT_AMBULATORY_CARE_PROVIDER_SITE_OTHER): Payer: Medicare Other | Admitting: Family Medicine

## 2019-11-14 VITALS — BP 136/75 | HR 78 | Temp 98.2°F | Ht 69.0 in | Wt 186.0 lb

## 2019-11-14 DIAGNOSIS — J4520 Mild intermittent asthma, uncomplicated: Secondary | ICD-10-CM

## 2019-11-14 DIAGNOSIS — E782 Mixed hyperlipidemia: Secondary | ICD-10-CM | POA: Diagnosis not present

## 2019-11-14 DIAGNOSIS — R011 Cardiac murmur, unspecified: Secondary | ICD-10-CM

## 2019-11-14 DIAGNOSIS — I10 Essential (primary) hypertension: Secondary | ICD-10-CM | POA: Diagnosis not present

## 2019-11-14 LAB — CMP14+EGFR
ALT: 10 IU/L (ref 0–44)
AST: 16 IU/L (ref 0–40)
Albumin/Globulin Ratio: 2.2 (ref 1.2–2.2)
Albumin: 4.2 g/dL (ref 3.7–4.7)
Alkaline Phosphatase: 57 IU/L (ref 48–121)
BUN/Creatinine Ratio: 16 (ref 10–24)
BUN: 14 mg/dL (ref 8–27)
Bilirubin Total: 0.7 mg/dL (ref 0.0–1.2)
CO2: 23 mmol/L (ref 20–29)
Calcium: 9.3 mg/dL (ref 8.6–10.2)
Chloride: 103 mmol/L (ref 96–106)
Creatinine, Ser: 0.86 mg/dL (ref 0.76–1.27)
GFR calc Af Amer: 95 mL/min/{1.73_m2} (ref >59)
GFR calc non Af Amer: 82 mL/min/{1.73_m2} (ref >59)
Globulin, Total: 1.9 g/dL (ref 1.5–4.5)
Glucose: 106 mg/dL — ABNORMAL HIGH (ref 65–99)
Potassium: 4.1 mmol/L (ref 3.5–5.2)
Sodium: 140 mmol/L (ref 134–144)
Total Protein: 6.1 g/dL (ref 6.0–8.5)

## 2019-11-14 LAB — LIPID PANEL
Chol/HDL Ratio: 2.6 ratio (ref 0.0–5.0)
Cholesterol, Total: 184 mg/dL (ref 100–199)
HDL: 70 mg/dL (ref >39)
LDL Chol Calc (NIH): 98 mg/dL (ref 0–99)
Triglycerides: 87 mg/dL (ref 0–149)
VLDL Cholesterol Cal: 16 mg/dL (ref 5–40)

## 2019-11-14 MED ORDER — SIMVASTATIN 10 MG PO TABS: 10.0000 mg | ORAL_TABLET | Freq: Every day | ORAL | 3 refills | Status: DC

## 2019-11-14 MED ORDER — MONTELUKAST SODIUM 10 MG PO TABS: ORAL_TABLET | ORAL | 3 refills | Status: DC

## 2019-11-14 NOTE — Progress Notes (Signed)
BP 136/75    Pulse 78    Temp 98.2 F (36.8 C)    Ht 5' 9"  (1.753 m)    Wt 186 lb (84.4 kg)    SpO2 96%    BMI 27.47 kg/m    Subjective:   Patient ID: Patrick Bell, male    DOB: October 06, 1940, 79 y.o.   MRN: [id number]  HPI: Patrick Bell is a 79 y.o. male presenting on 11/14/2019 for Medical Management of Chronic Issues, Hypertension, Hyperlipidemia, and Gastroesophageal Reflux   HPI Hypertension Patient is currently on no medication currently at, and their blood pressure today is 136/75. Patient was having the occasional lightheadedness and dizziness so he stopped his blood pressure medicine has been doing slightly better. Patient denies headaches, blurred vision, chest pains, shortness of breath, or weakness. Denies any side effects from medication and is content with current medication.   Hyperlipidemia Patient is coming in for recheck of his hyperlipidemia. The patient is currently taking simvastatin. They deny any issues with myalgias or history of liver damage from it. They deny any focal numbness or weakness or chest pain.   Asthma Patient is coming in for COPD recheck today.  He is currently on Singulair and ProAir as needed, he says his only had to use ProAir 3 times this summer over the past few months.  He has a mild chronic cough but denies any major coughing spells or wheezing spells.  He has 0nighttime symptoms per week and 3daytime symptoms per week currently, his daytime symptoms mainly include a cough that has some sputum production that is white but nothing worsened, just something he fights more chronically..   Patient has a history of murmur and symptoms of murmur and is not had echocardiogram in quite some time.  Relevant past medical, surgical, family and social history reviewed and updated as indicated. Interim medical history since our last visit reviewed. Allergies and medications reviewed and updated.  Review of Systems  Constitutional: Negative for chills  and fever.  Respiratory: Negative for shortness of breath and wheezing.   Cardiovascular: Negative for chest pain, palpitations and leg swelling.  Musculoskeletal: Negative for back pain and gait problem.  Skin: Negative for rash.  Neurological: Positive for dizziness and light-headedness. Negative for weakness.  All other systems reviewed and are negative.   Per HPI unless specifically indicated above   Allergies as of 11/14/2019   No Known Allergies     Medication List       Accurate as of November 14, 2019  9:55 AM. If you have any questions, ask your nurse or doctor.        fexofenadine-pseudoephedrine 60-120 MG 12 hr tablet Commonly known as: ALLEGRA-D Take 1 po bid   montelukast 10 MG tablet Commonly known as: SINGULAIR Take 1 tablet (10 mg total) daily by mouth.   Olopatadine HCl 0.2 % Soln Commonly known as: Pataday INSTILL 1 DROP IN EACH EYE TWICE DAILY   pantoprazole 40 MG tablet Commonly known as: PROTONIX Take 1 tablet (40 mg total) daily by mouth.   ProAir HFA 108 (90 Base) MCG/ACT inhaler Generic drug: albuterol Inhale 2 puffs into the lungs every 6 (six) hours as needed for wheezing.   simvastatin 10 MG tablet Commonly known as: ZOCOR Take 1 tablet (10 mg total) by mouth at bedtime.        Objective:   BP 136/75    Pulse 78    Temp 98.2 F (36.8 C)  Ht 5' 9"  (1.753 m)    Wt 186 lb (84.4 kg)    SpO2 96%    BMI 27.47 kg/m   Wt Readings from Last 3 Encounters:  11/14/19 186 lb (84.4 kg)  09/11/19 187 lb (84.8 kg)  05/17/19 195 lb 9.6 oz (88.7 kg)    Physical Exam Vitals and nursing note reviewed.  Constitutional:      General: He is not in acute distress.    Appearance: He is well-developed. He is not diaphoretic.  Eyes:     General: No scleral icterus.    Conjunctiva/sclera: Conjunctivae normal.  Neck:     Thyroid: No thyromegaly.  Cardiovascular:     Rate and Rhythm: Normal rate and regular rhythm.     Heart sounds: Normal heart  sounds. No murmur heard.   Pulmonary:     Effort: Pulmonary effort is normal. No respiratory distress.     Breath sounds: Normal breath sounds. No wheezing or rhonchi.  Musculoskeletal:        General: Normal range of motion.     Cervical back: Neck supple.  Lymphadenopathy:     Cervical: No cervical adenopathy.  Skin:    General: Skin is warm and dry.     Findings: No rash.  Neurological:     Mental Status: He is alert and oriented to person, place, and time.     Coordination: Coordination normal.  Psychiatric:        Behavior: Behavior normal.       Assessment & Plan:   Problem List Items Addressed This Visit      Cardiovascular and Mediastinum   Essential hypertension, benign - Primary   Relevant Medications   simvastatin (ZOCOR) 10 MG tablet   Other Relevant Orders   CMP14+EGFR     Respiratory   Asthma   Relevant Medications   montelukast (SINGULAIR) 10 MG tablet     Other   Hyperlipidemia   Relevant Medications   simvastatin (ZOCOR) 10 MG tablet   Other Relevant Orders   Lipid panel    Other Visit Diagnoses    Systolic murmur at cardiac apex       Relevant Orders   ECHOCARDIOGRAM COMPLETE      Patient gets the occasional lightheadedness and dizziness but backed off on his blood pressure medicine, also has the occasional cough with his asthma but nothing worsened, it is the typical that he has, he is currently taking his Singulair which does help with that. Follow up plan: Return in about 6 months (around 05/16/2020), or if symptoms worsen or fail to improve, for Hypertension and asthma and cholesterol and physical.  Counseling provided for all of the vaccine components Orders Placed This Encounter  Procedures   CMP14+EGFR   Lipid panel    Caryl Pina, MD [facility] 11/14/2019, 9:55 AM

## 2019-11-20 DIAGNOSIS — M9904 Segmental and somatic dysfunction of sacral region: Secondary | ICD-10-CM | POA: Diagnosis not present

## 2019-11-20 DIAGNOSIS — M9903 Segmental and somatic dysfunction of lumbar region: Secondary | ICD-10-CM | POA: Diagnosis not present

## 2019-11-20 DIAGNOSIS — M5137 Other intervertebral disc degeneration, lumbosacral region: Secondary | ICD-10-CM | POA: Diagnosis not present

## 2019-11-20 DIAGNOSIS — M9905 Segmental and somatic dysfunction of pelvic region: Secondary | ICD-10-CM | POA: Diagnosis not present

## 2019-11-25 ENCOUNTER — Other Ambulatory Visit: Payer: Self-pay

## 2019-11-25 ENCOUNTER — Ambulatory Visit (HOSPITAL_COMMUNITY)
Admission: RE | Admit: 2019-11-25 | Discharge: 2019-11-25 | Disposition: A | Discharge status: Home / Self Care | Payer: Medicare Other | Source: Ambulatory Visit | Attending: Family Medicine | Admitting: Family Medicine

## 2019-11-25 ENCOUNTER — Other Ambulatory Visit (HOSPITAL_COMMUNITY): Payer: Self-pay

## 2019-11-25 DIAGNOSIS — R011 Cardiac murmur, unspecified: Secondary | ICD-10-CM | POA: Diagnosis not present

## 2019-11-25 DIAGNOSIS — E785 Hyperlipidemia, unspecified: Secondary | ICD-10-CM | POA: Insufficient documentation

## 2019-11-25 DIAGNOSIS — K219 Gastro-esophageal reflux disease without esophagitis: Secondary | ICD-10-CM | POA: Diagnosis not present

## 2019-11-25 DIAGNOSIS — I10 Essential (primary) hypertension: Secondary | ICD-10-CM | POA: Diagnosis not present

## 2019-11-25 DIAGNOSIS — I340 Nonrheumatic mitral (valve) insufficiency: Secondary | ICD-10-CM | POA: Insufficient documentation

## 2019-11-25 LAB — ECHOCARDIOGRAM COMPLETE
Area-P 1/2: 4.21 cm2
S' Lateral: 3.8 cm

## 2019-11-27 DIAGNOSIS — M9905 Segmental and somatic dysfunction of pelvic region: Secondary | ICD-10-CM | POA: Diagnosis not present

## 2019-11-27 DIAGNOSIS — M9903 Segmental and somatic dysfunction of lumbar region: Secondary | ICD-10-CM | POA: Diagnosis not present

## 2019-11-27 DIAGNOSIS — M5137 Other intervertebral disc degeneration, lumbosacral region: Secondary | ICD-10-CM | POA: Diagnosis not present

## 2019-11-27 DIAGNOSIS — M9904 Segmental and somatic dysfunction of sacral region: Secondary | ICD-10-CM | POA: Diagnosis not present

## 2019-11-29 ENCOUNTER — Other Ambulatory Visit: Payer: Self-pay | Admitting: Family Medicine

## 2019-11-29 DIAGNOSIS — I340 Nonrheumatic mitral (valve) insufficiency: Secondary | ICD-10-CM

## 2019-11-29 NOTE — Progress Notes (Signed)
Spoke with patient about results from echocardiogram and severe mitral regurg, he has been having some lightheadedness and dizziness, will refer to cardiology.

## 2019-12-04 DIAGNOSIS — M9905 Segmental and somatic dysfunction of pelvic region: Secondary | ICD-10-CM | POA: Diagnosis not present

## 2019-12-04 DIAGNOSIS — R011 Cardiac murmur, unspecified: Secondary | ICD-10-CM

## 2019-12-04 DIAGNOSIS — M9903 Segmental and somatic dysfunction of lumbar region: Secondary | ICD-10-CM | POA: Diagnosis not present

## 2019-12-04 DIAGNOSIS — M9904 Segmental and somatic dysfunction of sacral region: Secondary | ICD-10-CM | POA: Diagnosis not present

## 2019-12-04 DIAGNOSIS — M5137 Other intervertebral disc degeneration, lumbosacral region: Secondary | ICD-10-CM | POA: Diagnosis not present

## 2019-12-04 HISTORY — DX: Cardiac murmur, unspecified: R01.1

## 2019-12-12 ENCOUNTER — Ambulatory Visit: Payer: Medicare Other | Admitting: Cardiovascular Disease

## 2019-12-12 ENCOUNTER — Encounter: Payer: Self-pay | Admitting: Cardiovascular Disease

## 2019-12-12 ENCOUNTER — Other Ambulatory Visit: Payer: Self-pay

## 2019-12-12 DIAGNOSIS — G4733 Obstructive sleep apnea (adult) (pediatric): Secondary | ICD-10-CM

## 2019-12-12 DIAGNOSIS — J4520 Mild intermittent asthma, uncomplicated: Secondary | ICD-10-CM

## 2019-12-12 DIAGNOSIS — Z01812 Encounter for preprocedural laboratory examination: Secondary | ICD-10-CM | POA: Diagnosis not present

## 2019-12-12 DIAGNOSIS — I10 Essential (primary) hypertension: Secondary | ICD-10-CM | POA: Diagnosis not present

## 2019-12-12 DIAGNOSIS — I341 Nonrheumatic mitral (valve) prolapse: Secondary | ICD-10-CM

## 2019-12-12 DIAGNOSIS — R0683 Snoring: Secondary | ICD-10-CM | POA: Diagnosis not present

## 2019-12-12 DIAGNOSIS — I340 Nonrheumatic mitral (valve) insufficiency: Secondary | ICD-10-CM

## 2019-12-12 DIAGNOSIS — M9903 Segmental and somatic dysfunction of lumbar region: Secondary | ICD-10-CM | POA: Diagnosis not present

## 2019-12-12 DIAGNOSIS — E7800 Pure hypercholesterolemia, unspecified: Secondary | ICD-10-CM

## 2019-12-12 DIAGNOSIS — M9905 Segmental and somatic dysfunction of pelvic region: Secondary | ICD-10-CM | POA: Diagnosis not present

## 2019-12-12 DIAGNOSIS — M9904 Segmental and somatic dysfunction of sacral region: Secondary | ICD-10-CM | POA: Diagnosis not present

## 2019-12-12 DIAGNOSIS — M5137 Other intervertebral disc degeneration, lumbosacral region: Secondary | ICD-10-CM | POA: Diagnosis not present

## 2019-12-12 MED ORDER — LOSARTAN POTASSIUM 25 MG PO TABS
25.0000 mg | ORAL_TABLET | Freq: Every day | ORAL | 1 refills | Status: DC
Start: 2019-12-12 — End: 2020-01-07

## 2019-12-12 NOTE — Patient Instructions (Addendum)
Medication Instructions:  BEGIN TAKING LOSARTAN 25MG  DAILY  *If you need a refill on your cardiac medications before your next appointment, please call your pharmacy*   Lab Work:  BMET CBC  COVID TEST:   12/16/19 AT 8:40AM- YOU MUST QUARANTINE AFTER THIS TEST  If you have labs (blood work) drawn today and your tests are completely normal, you will receive your results only by: Marland Kitchen MyChart Message (if you have MyChart) OR . A paper copy in the mail If you have any lab test that is abnormal or we need to change your treatment, we will call you to review the results.   Testing/Procedures: TEE ON September 16TH  Your physician has recommended that you have a sleep study. This test records several body functions during sleep, including: brain activity, eye movement, oxygen and carbon dioxide blood levels, heart rate and rhythm, breathing rate and rhythm, the flow of air through your mouth and nose, snoring, body muscle movements, and chest and belly movement.     Follow-Up: At [facility], you and your health needs are our priority.  As part of our continuing mission to provide you with exceptional heart care, we have created designated Provider Care Teams.  These Care Teams include your primary Cardiologist (physician) and Advanced Practice Providers (APPs -  Physician Assistants and Nurse Practitioners) who all work together to provide you with the care you need, when you need it.  We recommend signing up for the patient portal called "MyChart".  Sign up information is provided on this After Visit Summary.  MyChart is used to connect with patients for Virtual Visits (Telemedicine).  Patients are able to view lab/test results, encounter notes, upcoming appointments, etc.  Non-urgent messages can be sent to your provider as well.   To learn more about what you can do with MyChart, go to [email or web address].    Your next appointment:   6 week(s)  The format for your next  appointment:   In Person  Provider:   Shelva Majestic, MD   Other Instructions  Dear Patrick Bell  You are scheduled for a TEE ON September 16TH with Dr. Harrell Gave.  Please arrive at the [facility] (Main Entrance A) at [facility]: [address] [place], [place] [address] at [facility] . (1 hour prior to procedure unless lab work is needed; if lab work is needed arrive 1.5 hours ahead)  DIET: Nothing to eat or drink after midnight except a sip of water with medications (see medication instructions below)  Labs: BMET CBC LABS Friday 12/13/19 OR Monday 12/16/19   You must have a responsible person to drive you home and stay in the waiting area during your procedure. Failure to do so could result in cancellation.  Bring your insurance cards.  *Special Note: Every effort is made to have your procedure done on time. Occasionally there are emergencies that occur at the hospital that may cause delays. Please be patient if a delay does occur.

## 2019-12-12 NOTE — H&P (View-Only) (Signed)
Cardiology Office Note    Date:  12/16/2019   ID:  Patrick Bell, DOB 14-Mar-1941, MRN [id number]  PCP:  Dettinger, Fransisca Kaufmann, MD  Cardiologist:  Shelva Majestic, MD   New cardiology evaluation referred through the courtesy of Dr. Vonna Kotyk Dettinger for evaluation of severe mitral regurgitation  History of Present Illness:  Patrick Bell is a 79 y.o. male who has a history of hypertension, hyperlipidemia, asthma as well as a history of obstructive sleep apnea diagnosed on a prior sleep study as severe.  Study interpreted by Dr. Halford Chessman in January 2016 showed an AHI of 56.9/h. Apparently he stopped using CPAP over 3 years ago.  He has a history of hypertension and remotely was on amlodipine but also has been off treatment for at least a year.  Recently has begun to notice increasing blood pressure typically in excess of 140.  He has noticed some mild shortness of breath with activity and also is experiences some nonexertional left-sided short-lived chest pains.  He was recently evaluated by Dr. Warrick Parisian.  He was referred for an echo Doppler study which was done in November 25, 2019 which revealed normal LV function with an EF of 60 to 65% with indeterminant diastolic parameters.  RV systolic function was normal.  There was mild LA dilation.  He was felt to have severe mitral regurgitation with moderate holosystolic prolapse of the middle scallop of the posterior leaflet of the mitral valve.  There was moderate mitral annular calcification in the severe MR had eccentric anteriorly directed jet.  There was no evidence for mitral stenosis.  He is now referred for cardiology evaluation.  Presently, he denies any awareness of rhythm abnormality.  He is sleeping poorly.  He snores and cannot sleep on his back.  An Epworth scale was calculated in the office today and this was severely elevated at 18 consistent with significant excessive daytime sleepiness.  Past Medical History:  Diagnosis Date  . Allergy   .  Asthma   . Cataract   . Cataract   . Diverticulitis   . GERD (gastroesophageal reflux disease)   . Hyperlipidemia   . Hypertension     Past Surgical History:  Procedure Laterality Date  . bilateral inguinal hernia    . COLONOSCOPY  06/09/2004   WUJ:[id number] colon diverticulosis, otherwise normal colonoscopy  . EYE SURGERY  08/2014  . HERNIA REPAIR    . PALATE / UVULA BIOPSY / EXCISION    . TONSILLECTOMY    . VASECTOMY      Current Medications: Outpatient Medications Prior to Visit  Medication Sig Dispense Refill  . fexofenadine-pseudoephedrine (ALLEGRA-D) 60-120 MG 12 hr tablet Take 1 po bid (Patient taking differently: Take 1 tablet by mouth daily as needed (allergies). ) 60 tablet 5  . montelukast (SINGULAIR) 10 MG tablet Take 1 tablet (10 mg total) daily by mouth. (Patient taking differently: Take 10 mg by mouth at bedtime. ) 90 tablet 3  . Olopatadine HCl (PATADAY) 0.2 % SOLN INSTILL 1 DROP IN [facility] EYE TWICE DAILY (Patient taking differently: Place 1 drop into both ears daily as needed (allergies). ) 2.5 mL 1  . pantoprazole (PROTONIX) 40 MG tablet Take 1 tablet (40 mg total) daily by mouth. (Patient taking differently: Take 40 mg by mouth at bedtime. ) 90 tablet 3  . PROAIR HFA 108 (90 Base) MCG/ACT inhaler Inhale 2 puffs into the lungs every 6 (six) hours as needed for wheezing. (Patient taking differently: Inhale 2 puffs into  the lungs every 6 (six) hours as needed for wheezing. ) 8.5 g 2  . simvastatin (ZOCOR) 10 MG tablet Take 1 tablet (10 mg total) by mouth at bedtime. 90 tablet 3   No facility-administered medications prior to visit.     Allergies:   Patient has no known allergies.   Social History   Socioeconomic History  . Marital status: Married    Spouse name: Patrick Bell  . Number of children: 2  . Years of education: 58  . Highest education level: Some college, no degree  Occupational History  . 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 Smoking status: Former Smoker    Packs/day: 4.00    Years: 25.00    Pack years: 100.00    Types: Cigarettes    Quit date: 06/23/1982    Years since quitting: 37.5  . Smokeless tobacco: Never Used  . 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 Vaping Use: Never used  Substance and Sexual Activity  . Alcohol use: Yes    Alcohol/week: 2.0 standard drinks    Types: 2 Shots of liquor per week    Comment: brandy a few times a month, beer once a month  . Drug use: No  . Sexual activity: Yes    Birth control/protection: Surgical  Other Topics Concern  . Not on file  Social History Narrative  . Not on file   Social Determinants of Health   Financial Resource Strain:   . Difficulty of Paying Living Expenses: Not on file  Food Insecurity:   . Worried About Charity fundraiser in the Last Year: Not on file  . Ran Out of Food in the Last Year: Not on file  Transportation Needs:   . Lack of Transportation (Medical): Not on file  . Lack of Transportation (Non-Medical): Not on file  Physical Activity: Sufficiently Active  . Days of Exercise per Week: 7 days  . Minutes of Exercise per Session: 60 min  Stress: No Stress Concern Present  . Feeling of Stress : Not at all  Social Connections:   . Frequency of Communication with Friends and Family: Not on file  . Frequency of Social Gatherings with Friends and Family: Not on file  . Attends Religious Services: Not on file  . Active Member of Clubs or Organizations: Not on file  . Attends Archivist Meetings: Not on file  . Marital Status: Not on file    He is married for 21 years.  He is retired and previously was a Administrator for [employer].  He completed 12th grade of education.  He quit tobacco in 1978 he does drink bourbon several times per week.  He does not routinely exercise due to being "lazy."  Family History:  The patient's family history includes Arthritis in his brother, father,  and sister; Dementia (age of onset: 80) in his mother; Heart disease in his father and mother; Hip fracture (age of onset: 41) in his mother; Osteoporosis in his mother.   His mother died at age 21 and father died at age 85.  He has 1 living brother age 87 and 1 sister age 12  ROS General: Negative; No fevers, chills, or night sweats;  HEENT: Negative; No changes in vision or hearing, sinus congestion, difficulty swallowing Pulmonary: Negative; No cough, wheezing, shortness of breath, hemoptysis Cardiovascular: See HPI GI: Negative; No nausea, vomiting, diarrhea,  or abdominal pain GU: Negative; No dysuria, hematuria, or difficulty voiding Musculoskeletal: Negative; no myalgias, joint pain, or weakness Hematologic/Oncology: Negative; no easy bruising, bleeding Endocrine: Negative; no heat/cold intolerance; no diabetes Neuro: Negative; no changes in balance, headaches Skin: Negative; No rashes or skin lesions Psychiatric: Negative; No behavioral problems, depression Sleep: Positive for previously diagnosed severe sleep apnea, currently untreated for at least 3 years with complaints of snoring, significant daytime sleepiness, nonrestorative sleep, and he cannot sleep on his back.  He is not aware of  bruxism, restless legs, hypnogognic hallucinations, no cataplexy Other comprehensive 14 point system review is negative.   PHYSICAL EXAM:   VS:  BP (!) 154/82   Pulse 79   Ht _0  (1.753 m)   Wt 184 lb (83.5 kg)   BMI 27.17 kg/m     Repeat blood pressure by me was 140/82  Wt Readings from Last 3 Encounters:  12/12/19 184 lb (83.5 kg)  11/14/19 186 lb (84.4 kg)  09/11/19 187 lb (84.8 kg)    General: Alert, oriented, no distress.  Skin: normal turgor, no rashes, warm and dry HEENT: Normocephalic, atraumatic. Pupils equal round and reactive to light; sclera anicteric; extraocular muscles intact;  Nose without nasal septal hypertrophy Mouth/Parynx benign; Mallinpatti scale 3 Neck: No  JVD, no carotid bruits; normal carotid upstroke Lungs: clear to ausculatation and percussion; no wheezing or rales Chest wall: without tenderness to palpitation Heart: PMI not displaced, RRR, s1 s2 normal, 6-9/5 systolic murmur LSB, apex, no diastolic murmur, no rubs, gallops, thrills, or heaves Abdomen: soft, nontender; no hepatosplenomehaly, BS+; abdominal aorta nontender and not dilated by palpation. Back: no CVA tenderness Pulses 2+ Musculoskeletal: full range of motion, normal strength, no joint deformities Extremities: no clubbing cyanosis or edema, Homan's sign negative  Neurologic: grossly nonfocal; Cranial nerves grossly wnl Psychologic: Normal mood and affect   Studies/Labs Reviewed:   EKG:  EKG is ordered today.  ECG (independently read by me): Sinus rhythm at 79 bpm, PVC and PAC, first-degree block with a PR interval 208 ms  Recent Labs: BMP Latest Ref Rng & Units 12/13/2019 11/14/2019 05/17/2019  Glucose 65 - 99 mg/dL 96 106(H) 82  BUN 8 - 27 mg/dL _1 Creatinine 0.76 - 1.27 mg/dL 0.86 0.86 0.89  BUN/Creat Ratio 10 - _2 Sodium 134 - 144 mmol/L 141 140 139  Potassium 3.5 - 5.2 mmol/L 4.3 4.1 4.5  Chloride 96 - 106 mmol/L 104 103 103  CO2 20 - 29 mmol/L _3 Calcium 8.6 - 10.2 mg/dL 9.6 9.3 9.2     Hepatic Function Latest Ref Rng & Units 11/14/2019 05/17/2019 03/12/2018  Total Protein 6.0 - 8.5 g/dL 6.1 6.2 6.6  Albumin 3.7 - 4.7 g/dL 4.2 4.2 4.5  AST 0 - 40 IU/L _4 ALT 0 - 44 IU/L _5 Alk Phosphatase 48 - 121 IU/L 57 61 60  Total Bilirubin 0.0 - 1.2 mg/dL 0.7 0.7 1.0    CBC Latest Ref Rng & Units 12/13/2019 05/17/2019 03/12/2018  WBC 3.4 - 10.8 x10E3/uL 4.3 4.5 7.9  Hemoglobin 13.0 - 17.7 g/dL 15.7 16.0 16.9  Hematocrit 37.5 - 51.0 % 46.4 48.1 51.0  Platelets 150 - 450 x10E3/uL 197 202 293   Lab Results  Component Value Date   MCV 86 12/13/2019   MCV 88 05/17/2019   MCV 87 03/12/2018   Lab Results  Component Value Date   TSH  4.060  03/12/2018   No results found for: HGBA1C   BNP No results found for: BNP  ProBNP No results found for: PROBNP   Lipid Panel     Component Value Date/Time   CHOL 184 11/14/2019 1004   CHOL 168 06/18/2012 1031   TRIG 87 11/14/2019 1004   TRIG 101 01/14/2013 1218   TRIG 102 06/18/2012 1031   HDL 70 11/14/2019 1004   HDL 65 01/14/2013 1218   HDL 62 06/18/2012 1031   CHOLHDL 2.6 11/14/2019 1004   LDLCALC 98 11/14/2019 1004   LDLCALC 99 01/14/2013 1218   LDLCALC 86 06/18/2012 1031   LABVLDL 16 11/14/2019 1004     RADIOLOGY: ECHOCARDIOGRAM COMPLETE  Result Date: 11/25/2019    ECHOCARDIOGRAM REPORT   Patient Name:   Patrick Bell Date of Exam: 11/25/2019 Medical Rec #:  [id number]       Height:       69.0 in Accession #:    [id number]      Weight:       186.0 lb Date of Birth:  Feb 10, 1941       BSA:          2.003 m Patient Age:    53 years        BP:           167/78 mmHg Patient Gender: M               HR:           80 bpm. Exam Location:  Outpatient Procedure: 2D Echo, Cardiac Doppler and Color Doppler Indications:    Murmur 785.2 / R01.1  History:        Patient has no prior history of Echocardiogram examinations.                 Risk Factors:Hypertension, Dyslipidemia and GERD.  Sonographer:    Bernadene Person RDCS Referring Phys: [phone] Victor  1. Severe mitral regurgitation. Recommend TEE for further clarification.  2. Left ventricular ejection fraction, by estimation, is 60 to 65%. The left ventricle has normal function. The left ventricle has no regional wall motion abnormalities. Left ventricular diastolic parameters are indeterminate.  3. Right ventricular systolic function is normal. The right ventricular size is normal.  4. Left atrial size was mildly dilated.  5. The mitral valve is abnormal. Severe mitral valve regurgitation. No evidence of mitral stenosis.  6. The aortic valve is normal in structure. Aortic valve regurgitation is not  visualized. No aortic stenosis is present.  7. The inferior vena cava is normal in size with greater than 50% respiratory variability, suggesting right atrial pressure of 3 mmHg. FINDINGS  Left Ventricle: Left ventricular ejection fraction, by estimation, is 60 to 65%. The left ventricle has normal function. The left ventricle has no regional wall motion abnormalities. The left ventricular internal cavity size was normal in size. There is  no left ventricular hypertrophy. Left ventricular diastolic parameters are indeterminate. Right Ventricle: The right ventricular size is normal. No increase in right ventricular wall thickness. Right ventricular systolic function is normal. Left Atrium: Left atrial size was mildly dilated. Right Atrium: Right atrial size was normal in size. Pericardium: There is no evidence of pericardial effusion. Mitral Valve: The mitral valve is abnormal. There is moderate holosystolic prolapse of the middle scallop of the posterior leaflet of the mitral valve. There is mild thickening of the mitral valve leaflet(s). Normal mobility of the mitral valve leaflets.  Moderate mitral  annular calcification. Severe mitral valve regurgitation, with eccentric anteriorly directed jet. No evidence of mitral valve stenosis. Tricuspid Valve: The tricuspid valve is normal in structure. Tricuspid valve regurgitation is not demonstrated. No evidence of tricuspid stenosis. Aortic Valve: The aortic valve is normal in structure. Aortic valve regurgitation is not visualized. No aortic stenosis is present. Pulmonic Valve: The pulmonic valve was normal in structure. Pulmonic valve regurgitation is not visualized. No evidence of pulmonic stenosis. Aorta: The aortic root is normal in size and structure. Venous: The inferior vena cava is normal in size with greater than 50% respiratory variability, suggesting right atrial pressure of 3 mmHg. IAS/Shunts: No atrial level shunt detected by color flow Doppler. Additional  Comments: Severe mitral regurgitation. Recommend TEE for further clarification.  LEFT VENTRICLE PLAX 2D LVIDd:         5.60 cm  Diastology LVIDs:         3.80 cm  LV e' lateral:   11.60 cm/s LV PW:         1.00 cm  LV E/e' lateral: 6.4 LV IVS:        0.90 cm  LV e' medial:    8.81 cm/s LVOT diam:     2.00 cm  LV E/e' medial:  8.5 LV SV:         53 LV SV Index:   26 LVOT Area:     3.14 cm  RIGHT VENTRICLE RV S prime:     11.60 cm/s TAPSE (M-mode): 1.7 cm LEFT ATRIUM           Index       RIGHT ATRIUM           Index LA diam:      3.80 cm 1.90 cm/m  RA Area:     14.10 cm LA Vol (A2C): 41.8 ml 20.87 ml/m RA Volume:   30.20 ml  15.08 ml/m LA Vol (A4C): 73.5 ml 36.69 ml/m  AORTIC VALVE LVOT Vmax:   93.10 cm/s LVOT Vmean:  63.300 cm/s LVOT VTI:    0.168 m  AORTA Ao Root diam: 3.50 cm Ao Asc diam:  3.50 cm MITRAL VALVE MV Area (PHT): 4.21 cm    SHUNTS MV Decel Time: 180 msec    Systemic VTI:  0.17 m MV E velocity: 74.70 cm/s  Systemic Diam: 2.00 cm MV A velocity: 62.60 cm/s MV E/A ratio:  1.19 Candee Furbish MD Electronically signed by Candee Furbish MD Signature Date/Time: 11/25/2019/5:52:10 PM    Final      Additional studies/ records that were reviewed today include:  I ECHO 11/25/2019 MPRESSIONS  1. Severe mitral regurgitation. Recommend TEE for further clarification.  2. Left ventricular ejection fraction, by estimation, is 60 to 65%. The  left ventricle has normal function. The left ventricle has no regional  wall motion abnormalities. Left ventricular diastolic parameters are  indeterminate.  3. Right ventricular systolic function is normal. The right ventricular  size is normal.  4. Left atrial size was mildly dilated.  5. The mitral valve is abnormal. Severe mitral valve regurgitation. No  evidence of mitral stenosis.  6. The aortic valve is normal in structure. Aortic valve regurgitation is  not visualized. No aortic stenosis is present.  7. The inferior vena cava is normal in size with  greater than 50%  respiratory variability, suggesting right atrial pressure of 3 mmHg.    ASSESSMENT:    1. Essential hypertension   2. Severe mitral regurgitation   3. OSA (obstructive  sleep apnea)   4. Snoring   5. Mitral valve prolapse   6. Pure hypercholesterolemia   7. Mild intermittent asthma without complication   8. Pre-procedure lab exam     PLAN:  [name] is an 79 year old gentleman who has a history of hypertension.  Remotely he had been on amlodipine but stopped taking the medication at least 1 year ago, a history of hyperlipidemia, currently on low-dose simvastatin 10 mg as well as a history of asthma for which he takes Montelukast 10 mg daily.  He has a history of GERD currently treated with pantoprazole 40 mg.  In the past he was diagnosed with severe sleep apnea and apparently was on CPAP therapy but has not used treatment in over 3 years.  He has significant symptoms of untreated sleep apnea presently with significant excessive daytime sleepiness with an Epworth scale endorsing at 18, history of snoring, inability to sleep on his back, nocturia, as well as nonrestorative sleep.  I reviewed his most recent echo Doppler study which showed normal systolic function with EF at 60 to 65% without regional wall motion abnormalities.  There was evidence for mild left atrial dilation.  He was found to have an abnormal mitral valve with moderate holosystolic prolapse of the middle scallop of the posterior leaflet and there is mild thickening of the mitral valve leaflet as well as moderate mitral annular calcification.  He was found to have severe mitral regurgitation with an eccentric anteriorly directed jet evidence for mitral stenosis.  I reviewed these findings with him in detail.  Presently I am scheduling him to undergo a transesophageal echo Doppler study for further of his abnormal mitral valve and reassessment of the severity of his MR.  His left atrium was mildly dilated.  I  discussed potential need for future mitral valve repair or or possible mitral valve clip alternative.  I also reviewed his previous sleep study which confirmed severe sleep apnea.  He has been off CPAP therapy for over 3 years.  I have recommended he have a reassessment of his sleep disordered breathing and we will schedule him for split-night protocol.  With his elevated blood pressure, I am initiating ARB therapy with losartan initially at low dose with plans for up titration.  I will see him in the office in 4 to 6 weeks for follow-up evaluation.   Medication Adjustments/Labs and Tests Ordered: Current medicines are reviewed at length with the patient today.  Concerns regarding medicines are outlined above.  Medication changes, Labs and Tests ordered today are listed in the Patient Instructions below. Patient Instructions  Medication Instructions:  BEGIN TAKING LOSARTAN 25MG DAILY  *If you need a refill on your cardiac medications before your next appointment, please call your pharmacy*   Lab Work:  BMET CBC  COVID TEST:   12/16/19 AT 8:40AM- YOU MUST QUARANTINE AFTER THIS TEST  If you have labs (blood work) drawn today and your tests are completely normal, you will receive your results only by: Marland Kitchen MyChart Message (if you have MyChart) OR . A paper copy in the mail If you have any lab test that is abnormal or we need to change your treatment, we will call you to review the results.   Testing/Procedures: TEE ON September 16TH  Your physician has recommended that you have a sleep study. This test records several body functions during sleep, including: brain activity, eye movement, oxygen and carbon dioxide blood levels, heart rate and rhythm, breathing rate and rhythm,  the flow of air through your mouth and nose, snoring, body muscle movements, and chest and belly movement.     Follow-Up: At [facility], you and your health needs are our priority.  As part of our continuing  mission to provide you with exceptional heart care, we have created designated Provider Care Teams.  These Care Teams include your primary Cardiologist (physician) and Advanced Practice Providers (APPs -  Physician Assistants and Nurse Practitioners) who all work together to provide you with the care you need, when you need it.  We recommend signing up for the patient portal called "MyChart".  Sign up information is provided on this After Visit Summary.  MyChart is used to connect with patients for Virtual Visits (Telemedicine).  Patients are able to view lab/test results, encounter notes, upcoming appointments, etc.  Non-urgent messages can be sent to your provider as well.   To learn more about what you can do with MyChart, go to [email or web address].    Your next appointment:   6 week(s)  The format for your next appointment:   In Person  Provider:   Shelva Majestic, MD   Other Instructions  Dear Deatra James  You are scheduled for a TEE ON September 16TH with Dr. Harrell Gave.  Please arrive at the [facility] (Main Entrance A) at [facility]: [address] [place], [place] [address] at [place] . (1 hour prior to procedure unless lab work is needed; if lab work is needed arrive 1.5 hours ahead)  DIET: Nothing to eat or drink after midnight except a sip of water with medications (see medication instructions below)  Labs: BMET CBC LABS Friday 12/13/19 OR Monday 12/16/19   You must have a responsible person to drive you home and stay in the waiting area during your procedure. Failure to do so could result in cancellation.  Bring your insurance cards.  *Special Note: Every effort is made to have your procedure done on time. Occasionally there are emergencies that occur at the hospital that may cause delays. Please be patient if a delay does occur.      Signed, Shelva Majestic, MD  12/16/2019 4:26 PM    [facility] [address] [place], [place], [place]   [address] Phone: [phone]

## 2019-12-12 NOTE — Progress Notes (Signed)
Cardiology Office Note    Date:  12/16/2019   ID:  Patrick Bell, DOB 14-Mar-1941, MRN [id number]  PCP:  Dettinger, Fransisca Kaufmann, MD  Cardiologist:  Shelva Majestic, MD   New cardiology evaluation referred through the courtesy of Dr. Vonna Kotyk Dettinger for evaluation of severe mitral regurgitation  History of Present Illness:  Patrick Bell is a 79 y.o. male who has a history of hypertension, hyperlipidemia, asthma as well as a history of obstructive sleep apnea diagnosed on a prior sleep study as severe.  Study interpreted by Dr. Halford Chessman in January 2016 showed an AHI of 56.9/h. Apparently he stopped using CPAP over 3 years ago.  He has a history of hypertension and remotely was on amlodipine but also has been off treatment for at least a year.  Recently has begun to notice increasing blood pressure typically in excess of 140.  He has noticed some mild shortness of breath with activity and also is experiences some nonexertional left-sided short-lived chest pains.  He was recently evaluated by Dr. Warrick Parisian.  He was referred for an echo Doppler study which was done in November 25, 2019 which revealed normal LV function with an EF of 60 to 65% with indeterminant diastolic parameters.  RV systolic function was normal.  There was mild LA dilation.  He was felt to have severe mitral regurgitation with moderate holosystolic prolapse of the middle scallop of the posterior leaflet of the mitral valve.  There was moderate mitral annular calcification in the severe MR had eccentric anteriorly directed jet.  There was no evidence for mitral stenosis.  He is now referred for cardiology evaluation.  Presently, he denies any awareness of rhythm abnormality.  He is sleeping poorly.  He snores and cannot sleep on his back.  An Epworth scale was calculated in the office today and this was severely elevated at 18 consistent with significant excessive daytime sleepiness.  Past Medical History:  Diagnosis Date  . Allergy   .  Asthma   . Cataract   . Cataract   . Diverticulitis   . GERD (gastroesophageal reflux disease)   . Hyperlipidemia   . Hypertension     Past Surgical History:  Procedure Laterality Date  . bilateral inguinal hernia    . COLONOSCOPY  06/09/2004   WUJ:[id number] colon diverticulosis, otherwise normal colonoscopy  . EYE SURGERY  08/2014  . HERNIA REPAIR    . PALATE / UVULA BIOPSY / EXCISION    . TONSILLECTOMY    . VASECTOMY      Current Medications: Outpatient Medications Prior to Visit  Medication Sig Dispense Refill  . fexofenadine-pseudoephedrine (ALLEGRA-D) 60-120 MG 12 hr tablet Take 1 po bid (Patient taking differently: Take 1 tablet by mouth daily as needed (allergies). ) 60 tablet 5  . montelukast (SINGULAIR) 10 MG tablet Take 1 tablet (10 mg total) daily by mouth. (Patient taking differently: Take 10 mg by mouth at bedtime. ) 90 tablet 3  . Olopatadine HCl (PATADAY) 0.2 % SOLN INSTILL 1 DROP IN [facility] EYE TWICE DAILY (Patient taking differently: Place 1 drop into both ears daily as needed (allergies). ) 2.5 mL 1  . pantoprazole (PROTONIX) 40 MG tablet Take 1 tablet (40 mg total) daily by mouth. (Patient taking differently: Take 40 mg by mouth at bedtime. ) 90 tablet 3  . PROAIR HFA 108 (90 Base) MCG/ACT inhaler Inhale 2 puffs into the lungs every 6 (six) hours as needed for wheezing. (Patient taking differently: Inhale 2 puffs into  the lungs every 6 (six) hours as needed for wheezing. ) 8.5 g 2  . simvastatin (ZOCOR) 10 MG tablet Take 1 tablet (10 mg total) by mouth at bedtime. 90 tablet 3   No facility-administered medications prior to visit.     Allergies:   Patient has no known allergies.   Social History   Socioeconomic History  . Marital status: Married    Spouse name: Wells Guiles  . Number of children: 2  . Years of education: 58  . Highest education level: Some college, no degree  Occupational History  . 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 Smoking status: Former Smoker    Packs/day: 4.00    Years: 25.00    Pack years: 100.00    Types: Cigarettes    Quit date: 06/23/1982    Years since quitting: 37.5  . Smokeless tobacco: Never Used  . 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 Vaping Use: Never used  Substance and Sexual Activity  . Alcohol use: Yes    Alcohol/week: 2.0 standard drinks    Types: 2 Shots of liquor per week    Comment: brandy a few times a month, beer once a month  . Drug use: No  . Sexual activity: Yes    Birth control/protection: Surgical  Other Topics Concern  . Not on file  Social History Narrative  . Not on file   Social Determinants of Health   Financial Resource Strain:   . Difficulty of Paying Living Expenses: Not on file  Food Insecurity:   . Worried About Charity fundraiser in the Last Year: Not on file  . Ran Out of Food in the Last Year: Not on file  Transportation Needs:   . Lack of Transportation (Medical): Not on file  . Lack of Transportation (Non-Medical): Not on file  Physical Activity: Sufficiently Active  . Days of Exercise per Week: 7 days  . Minutes of Exercise per Session: 60 min  Stress: No Stress Concern Present  . Feeling of Stress : Not at all  Social Connections:   . Frequency of Communication with Friends and Family: Not on file  . Frequency of Social Gatherings with Friends and Family: Not on file  . Attends Religious Services: Not on file  . Active Member of Clubs or Organizations: Not on file  . Attends Archivist Meetings: Not on file  . Marital Status: Not on file    He is married for 21 years.  He is retired and previously was a Administrator for [employer].  He completed 12th grade of education.  He quit tobacco in 1978 he does drink bourbon several times per week.  He does not routinely exercise due to being "lazy."  Family History:  The patient's family history includes Arthritis in his brother, father,  and sister; Dementia (age of onset: 80) in his mother; Heart disease in his father and mother; Hip fracture (age of onset: 41) in his mother; Osteoporosis in his mother.   His mother died at age 21 and father died at age 85.  He has 1 living brother age 87 and 1 sister age 12  ROS General: Negative; No fevers, chills, or night sweats;  HEENT: Negative; No changes in vision or hearing, sinus congestion, difficulty swallowing Pulmonary: Negative; No cough, wheezing, shortness of breath, hemoptysis Cardiovascular: See HPI GI: Negative; No nausea, vomiting, diarrhea,  or abdominal pain GU: Negative; No dysuria, hematuria, or difficulty voiding Musculoskeletal: Negative; no myalgias, joint pain, or weakness Hematologic/Oncology: Negative; no easy bruising, bleeding Endocrine: Negative; no heat/cold intolerance; no diabetes Neuro: Negative; no changes in balance, headaches Skin: Negative; No rashes or skin lesions Psychiatric: Negative; No behavioral problems, depression Sleep: Positive for previously diagnosed severe sleep apnea, currently untreated for at least 3 years with complaints of snoring, significant daytime sleepiness, nonrestorative sleep, and he cannot sleep on his back.  He is not aware of  bruxism, restless legs, hypnogognic hallucinations, no cataplexy Other comprehensive 14 point system review is negative.   PHYSICAL EXAM:   VS:  BP (!) 154/82   Pulse 79   Ht _0  (1.753 m)   Wt 184 lb (83.5 kg)   BMI 27.17 kg/m     Repeat blood pressure by me was 140/82  Wt Readings from Last 3 Encounters:  12/12/19 184 lb (83.5 kg)  11/14/19 186 lb (84.4 kg)  09/11/19 187 lb (84.8 kg)    General: Alert, oriented, no distress.  Skin: normal turgor, no rashes, warm and dry HEENT: Normocephalic, atraumatic. Pupils equal round and reactive to light; sclera anicteric; extraocular muscles intact;  Nose without nasal septal hypertrophy Mouth/Parynx benign; Mallinpatti scale 3 Neck: No  JVD, no carotid bruits; normal carotid upstroke Lungs: clear to ausculatation and percussion; no wheezing or rales Chest wall: without tenderness to palpitation Heart: PMI not displaced, RRR, s1 s2 normal, 6-9/5 systolic murmur LSB, apex, no diastolic murmur, no rubs, gallops, thrills, or heaves Abdomen: soft, nontender; no hepatosplenomehaly, BS+; abdominal aorta nontender and not dilated by palpation. Back: no CVA tenderness Pulses 2+ Musculoskeletal: full range of motion, normal strength, no joint deformities Extremities: no clubbing cyanosis or edema, Homan's sign negative  Neurologic: grossly nonfocal; Cranial nerves grossly wnl Psychologic: Normal mood and affect   Studies/Labs Reviewed:   EKG:  EKG is ordered today.  ECG (independently read by me): Sinus rhythm at 79 bpm, PVC and PAC, first-degree block with a PR interval 208 ms  Recent Labs: BMP Latest Ref Rng & Units 12/13/2019 11/14/2019 05/17/2019  Glucose 65 - 99 mg/dL 96 106(H) 82  BUN 8 - 27 mg/dL _1 Creatinine 0.76 - 1.27 mg/dL 0.86 0.86 0.89  BUN/Creat Ratio 10 - _2 Sodium 134 - 144 mmol/L 141 140 139  Potassium 3.5 - 5.2 mmol/L 4.3 4.1 4.5  Chloride 96 - 106 mmol/L 104 103 103  CO2 20 - 29 mmol/L _3 Calcium 8.6 - 10.2 mg/dL 9.6 9.3 9.2     Hepatic Function Latest Ref Rng & Units 11/14/2019 05/17/2019 03/12/2018  Total Protein 6.0 - 8.5 g/dL 6.1 6.2 6.6  Albumin 3.7 - 4.7 g/dL 4.2 4.2 4.5  AST 0 - 40 IU/L _4 ALT 0 - 44 IU/L _5 Alk Phosphatase 48 - 121 IU/L 57 61 60  Total Bilirubin 0.0 - 1.2 mg/dL 0.7 0.7 1.0    CBC Latest Ref Rng & Units 12/13/2019 05/17/2019 03/12/2018  WBC 3.4 - 10.8 x10E3/uL 4.3 4.5 7.9  Hemoglobin 13.0 - 17.7 g/dL 15.7 16.0 16.9  Hematocrit 37.5 - 51.0 % 46.4 48.1 51.0  Platelets 150 - 450 x10E3/uL 197 202 293   Lab Results  Component Value Date   MCV 86 12/13/2019   MCV 88 05/17/2019   MCV 87 03/12/2018   Lab Results  Component Value Date   TSH  4.060  03/12/2018   No results found for: HGBA1C   BNP No results found for: BNP  ProBNP No results found for: PROBNP   Lipid Panel     Component Value Date/Time   CHOL 184 11/14/2019 1004   CHOL 168 06/18/2012 1031   TRIG 87 11/14/2019 1004   TRIG 101 01/14/2013 1218   TRIG 102 06/18/2012 1031   HDL 70 11/14/2019 1004   HDL 65 01/14/2013 1218   HDL 62 06/18/2012 1031   CHOLHDL 2.6 11/14/2019 1004   LDLCALC 98 11/14/2019 1004   LDLCALC 99 01/14/2013 1218   LDLCALC 86 06/18/2012 1031   LABVLDL 16 11/14/2019 1004     RADIOLOGY: ECHOCARDIOGRAM COMPLETE  Result Date: 11/25/2019    ECHOCARDIOGRAM REPORT   Patient Name:   Patrick Bell Date of Exam: 11/25/2019 Medical Rec #:  [id number]       Height:       69.0 in Accession #:    [id number]      Weight:       186.0 lb Date of Birth:  Feb 10, 1941       BSA:          2.003 m Patient Age:    53 years        BP:           167/78 mmHg Patient Gender: M               HR:           80 bpm. Exam Location:  Outpatient Procedure: 2D Echo, Cardiac Doppler and Color Doppler Indications:    Murmur 785.2 / R01.1  History:        Patient has no prior history of Echocardiogram examinations.                 Risk Factors:Hypertension, Dyslipidemia and GERD.  Sonographer:    Bernadene Person RDCS Referring Phys: [phone] Victor  1. Severe mitral regurgitation. Recommend TEE for further clarification.  2. Left ventricular ejection fraction, by estimation, is 60 to 65%. The left ventricle has normal function. The left ventricle has no regional wall motion abnormalities. Left ventricular diastolic parameters are indeterminate.  3. Right ventricular systolic function is normal. The right ventricular size is normal.  4. Left atrial size was mildly dilated.  5. The mitral valve is abnormal. Severe mitral valve regurgitation. No evidence of mitral stenosis.  6. The aortic valve is normal in structure. Aortic valve regurgitation is not  visualized. No aortic stenosis is present.  7. The inferior vena cava is normal in size with greater than 50% respiratory variability, suggesting right atrial pressure of 3 mmHg. FINDINGS  Left Ventricle: Left ventricular ejection fraction, by estimation, is 60 to 65%. The left ventricle has normal function. The left ventricle has no regional wall motion abnormalities. The left ventricular internal cavity size was normal in size. There is  no left ventricular hypertrophy. Left ventricular diastolic parameters are indeterminate. Right Ventricle: The right ventricular size is normal. No increase in right ventricular wall thickness. Right ventricular systolic function is normal. Left Atrium: Left atrial size was mildly dilated. Right Atrium: Right atrial size was normal in size. Pericardium: There is no evidence of pericardial effusion. Mitral Valve: The mitral valve is abnormal. There is moderate holosystolic prolapse of the middle scallop of the posterior leaflet of the mitral valve. There is mild thickening of the mitral valve leaflet(s). Normal mobility of the mitral valve leaflets.  Moderate mitral  annular calcification. Severe mitral valve regurgitation, with eccentric anteriorly directed jet. No evidence of mitral valve stenosis. Tricuspid Valve: The tricuspid valve is normal in structure. Tricuspid valve regurgitation is not demonstrated. No evidence of tricuspid stenosis. Aortic Valve: The aortic valve is normal in structure. Aortic valve regurgitation is not visualized. No aortic stenosis is present. Pulmonic Valve: The pulmonic valve was normal in structure. Pulmonic valve regurgitation is not visualized. No evidence of pulmonic stenosis. Aorta: The aortic root is normal in size and structure. Venous: The inferior vena cava is normal in size with greater than 50% respiratory variability, suggesting right atrial pressure of 3 mmHg. IAS/Shunts: No atrial level shunt detected by color flow Doppler. Additional  Comments: Severe mitral regurgitation. Recommend TEE for further clarification.  LEFT VENTRICLE PLAX 2D LVIDd:         5.60 cm  Diastology LVIDs:         3.80 cm  LV e' lateral:   11.60 cm/s LV PW:         1.00 cm  LV E/e' lateral: 6.4 LV IVS:        0.90 cm  LV e' medial:    8.81 cm/s LVOT diam:     2.00 cm  LV E/e' medial:  8.5 LV SV:         53 LV SV Index:   26 LVOT Area:     3.14 cm  RIGHT VENTRICLE RV S prime:     11.60 cm/s TAPSE (M-mode): 1.7 cm LEFT ATRIUM           Index       RIGHT ATRIUM           Index LA diam:      3.80 cm 1.90 cm/m  RA Area:     14.10 cm LA Vol (A2C): 41.8 ml 20.87 ml/m RA Volume:   30.20 ml  15.08 ml/m LA Vol (A4C): 73.5 ml 36.69 ml/m  AORTIC VALVE LVOT Vmax:   93.10 cm/s LVOT Vmean:  63.300 cm/s LVOT VTI:    0.168 m  AORTA Ao Root diam: 3.50 cm Ao Asc diam:  3.50 cm MITRAL VALVE MV Area (PHT): 4.21 cm    SHUNTS MV Decel Time: 180 msec    Systemic VTI:  0.17 m MV E velocity: 74.70 cm/s  Systemic Diam: 2.00 cm MV A velocity: 62.60 cm/s MV E/A ratio:  1.19 Candee Furbish MD Electronically signed by Candee Furbish MD Signature Date/Time: 11/25/2019/5:52:10 PM    Final      Additional studies/ records that were reviewed today include:  I ECHO 11/25/2019 MPRESSIONS  1. Severe mitral regurgitation. Recommend TEE for further clarification.  2. Left ventricular ejection fraction, by estimation, is 60 to 65%. The  left ventricle has normal function. The left ventricle has no regional  wall motion abnormalities. Left ventricular diastolic parameters are  indeterminate.  3. Right ventricular systolic function is normal. The right ventricular  size is normal.  4. Left atrial size was mildly dilated.  5. The mitral valve is abnormal. Severe mitral valve regurgitation. No  evidence of mitral stenosis.  6. The aortic valve is normal in structure. Aortic valve regurgitation is  not visualized. No aortic stenosis is present.  7. The inferior vena cava is normal in size with  greater than 50%  respiratory variability, suggesting right atrial pressure of 3 mmHg.    ASSESSMENT:    1. Essential hypertension   2. Severe mitral regurgitation   3. OSA (obstructive  sleep apnea)   4. Snoring   5. Mitral valve prolapse   6. Pure hypercholesterolemia   7. Mild intermittent asthma without complication   8. Pre-procedure lab exam     PLAN:  Patrick Bell is an 79 year old gentleman who has a history of hypertension.  Remotely he had been on amlodipine but stopped taking the medication at least 1 year ago, a history of hyperlipidemia, currently on low-dose simvastatin 10 mg as well as a history of asthma for which he takes Montelukast 10 mg daily.  He has a history of GERD currently treated with pantoprazole 40 mg.  In the past he was diagnosed with severe sleep apnea and apparently was on CPAP therapy but has not used treatment in over 3 years.  He has significant symptoms of untreated sleep apnea presently with significant excessive daytime sleepiness with an Epworth scale endorsing at 18, history of snoring, inability to sleep on his back, nocturia, as well as nonrestorative sleep.  I reviewed his most recent echo Doppler study which showed normal systolic function with EF at 60 to 65% without regional wall motion abnormalities.  There was evidence for mild left atrial dilation.  He was found to have an abnormal mitral valve with moderate holosystolic prolapse of the middle scallop of the posterior leaflet and there is mild thickening of the mitral valve leaflet as well as moderate mitral annular calcification.  He was found to have severe mitral regurgitation with an eccentric anteriorly directed jet evidence for mitral stenosis.  I reviewed these findings with him in detail.  Presently I am scheduling him to undergo a transesophageal echo Doppler study for further of his abnormal mitral valve and reassessment of the severity of his MR.  His left atrium was mildly dilated.  I  discussed potential need for future mitral valve repair or or possible mitral valve clip alternative.  I also reviewed his previous sleep study which confirmed severe sleep apnea.  He has been off CPAP therapy for over 3 years.  I have recommended he have a reassessment of his sleep disordered breathing and we will schedule him for split-night protocol.  With his elevated blood pressure, I am initiating ARB therapy with losartan initially at low dose with plans for up titration.  I will see him in the office in 4 to 6 weeks for follow-up evaluation.   Medication Adjustments/Labs and Tests Ordered: Current medicines are reviewed at length with the patient today.  Concerns regarding medicines are outlined above.  Medication changes, Labs and Tests ordered today are listed in the Patient Instructions below. Patient Instructions  Medication Instructions:  BEGIN TAKING LOSARTAN 25MG DAILY  *If you need a refill on your cardiac medications before your next appointment, please call your pharmacy*   Lab Work:  BMET CBC  COVID TEST:   12/16/19 AT 8:40AM- YOU MUST QUARANTINE AFTER THIS TEST  If you have labs (blood work) drawn today and your tests are completely normal, you will receive your results only by: Marland Kitchen MyChart Message (if you have MyChart) OR . A paper copy in the mail If you have any lab test that is abnormal or we need to change your treatment, we will call you to review the results.   Testing/Procedures: TEE ON September 16TH  Your physician has recommended that you have a sleep study. This test records several body functions during sleep, including: brain activity, eye movement, oxygen and carbon dioxide blood levels, heart rate and rhythm, breathing rate and rhythm,  the flow of air through your mouth and nose, snoring, body muscle movements, and chest and belly movement.     Follow-Up: At [facility], you and your health needs are our priority.  As part of our continuing  mission to provide you with exceptional heart care, we have created designated Provider Care Teams.  These Care Teams include your primary Cardiologist (physician) and Advanced Practice Providers (APPs -  Physician Assistants and Nurse Practitioners) who all work together to provide you with the care you need, when you need it.  We recommend signing up for the patient portal called "MyChart".  Sign up information is provided on this After Visit Summary.  MyChart is used to connect with patients for Virtual Visits (Telemedicine).  Patients are able to view lab/test results, encounter notes, upcoming appointments, etc.  Non-urgent messages can be sent to your provider as well.   To learn more about what you can do with MyChart, go to [email or web address].    Your next appointment:   6 week(s)  The format for your next appointment:   In Person  Provider:   Shelva Majestic, MD   Other Instructions  Dear Patrick Bell  You are scheduled for a TEE ON September 16TH with Dr. Harrell Gave.  Please arrive at the [facility] (Main Entrance A) at [facility]: [address] [place], [place] [address] at [place] . (1 hour prior to procedure unless lab work is needed; if lab work is needed arrive 1.5 hours ahead)  DIET: Nothing to eat or drink after midnight except a sip of water with medications (see medication instructions below)  Labs: BMET CBC LABS Friday 12/13/19 OR Monday 12/16/19   You must have a responsible person to drive you home and stay in the waiting area during your procedure. Failure to do so could result in cancellation.  Bring your insurance cards.  *Special Note: Every effort is made to have your procedure done on time. Occasionally there are emergencies that occur at the hospital that may cause delays. Please be patient if a delay does occur.      Signed, Shelva Majestic, MD  12/16/2019 4:26 PM    [facility] [address] [place], [place], [place]   [address] Phone: [phone]

## 2019-12-13 ENCOUNTER — Other Ambulatory Visit: Payer: Medicare Other

## 2019-12-13 ENCOUNTER — Other Ambulatory Visit: Payer: Self-pay

## 2019-12-13 DIAGNOSIS — R011 Cardiac murmur, unspecified: Secondary | ICD-10-CM | POA: Diagnosis not present

## 2019-12-13 DIAGNOSIS — Z01812 Encounter for preprocedural laboratory examination: Secondary | ICD-10-CM | POA: Diagnosis not present

## 2019-12-13 DIAGNOSIS — R0683 Snoring: Secondary | ICD-10-CM | POA: Diagnosis not present

## 2019-12-14 LAB — CBC
Hematocrit: 46.4 % (ref 37.5–51.0)
Hemoglobin: 15.7 g/dL (ref 13.0–17.7)
MCH: 29.1 pg (ref 26.6–33.0)
MCHC: 33.8 g/dL (ref 31.5–35.7)
MCV: 86 fL (ref 79–97)
Platelets: 197 10*3/uL (ref 150–450)
RBC: 5.4 x10E6/uL (ref 4.14–5.80)
RDW: 13 % (ref 11.6–15.4)
WBC: 4.3 10*3/uL (ref 3.4–10.8)

## 2019-12-14 LAB — BASIC METABOLIC PANEL
BUN/Creatinine Ratio: 20 (ref 10–24)
BUN: 17 mg/dL (ref 8–27)
CO2: 27 mmol/L (ref 20–29)
Calcium: 9.6 mg/dL (ref 8.6–10.2)
Chloride: 104 mmol/L (ref 96–106)
Creatinine, Ser: 0.86 mg/dL (ref 0.76–1.27)
GFR calc Af Amer: 95 mL/min/{1.73_m2} (ref >59)
GFR calc non Af Amer: 82 mL/min/{1.73_m2} (ref >59)
Glucose: 96 mg/dL (ref 65–99)
Potassium: 4.3 mmol/L (ref 3.5–5.2)
Sodium: 141 mmol/L (ref 134–144)

## 2019-12-16 ENCOUNTER — Telehealth: Payer: Self-pay | Admitting: Cardiovascular Disease

## 2019-12-16 ENCOUNTER — Encounter: Payer: Self-pay | Admitting: Cardiovascular Disease

## 2019-12-16 ENCOUNTER — Other Ambulatory Visit (HOSPITAL_COMMUNITY)
Admission: RE | Admit: 2019-12-16 | Discharge: 2019-12-16 | Disposition: A | Discharge status: Home / Self Care | Payer: Medicare Other | Source: Ambulatory Visit | Attending: Cardiology | Admitting: Cardiology

## 2019-12-16 DIAGNOSIS — Z01812 Encounter for preprocedural laboratory examination: Secondary | ICD-10-CM | POA: Insufficient documentation

## 2019-12-16 DIAGNOSIS — Z20822 Contact with and (suspected) exposure to covid-19: Secondary | ICD-10-CM | POA: Diagnosis not present

## 2019-12-16 LAB — SARS CORONAVIRUS 2 (TAT 6-24 HRS): SARS Coronavirus 2: NEGATIVE

## 2019-12-16 NOTE — Telephone Encounter (Signed)
Patient is requesting to schedule a sleep study, as discussed during appointment on 12/12/19 with Dr. Claiborne Billings. Please assist.

## 2019-12-16 NOTE — Telephone Encounter (Signed)
Patient is calling about when he will be scheduled for a sleep study. Split night study was ordered by Dr. Claiborne Billings at his last office visit. I advised him that someone from the office will be calling him to schedule the sleep study but it will not be done until after his TEE.

## 2019-12-19 ENCOUNTER — Other Ambulatory Visit: Payer: Self-pay

## 2019-12-19 ENCOUNTER — Ambulatory Visit (HOSPITAL_BASED_OUTPATIENT_CLINIC_OR_DEPARTMENT_OTHER)
Admission: RE | Admit: 2019-12-19 | Discharge: 2019-12-19 | Disposition: A | Discharge status: Home / Self Care | Payer: Medicare Other | Source: Home / Self Care | Attending: Cardiology | Admitting: Cardiology

## 2019-12-19 ENCOUNTER — Ambulatory Visit (HOSPITAL_COMMUNITY)
Admission: RE | Admit: 2019-12-19 | Discharge: 2019-12-19 | Disposition: A | Discharge status: Home / Self Care | Payer: Medicare Other | Attending: Cardiology | Admitting: Cardiology

## 2019-12-19 ENCOUNTER — Ambulatory Visit (HOSPITAL_COMMUNITY): Payer: Medicare Other | Admitting: Anesthesiology

## 2019-12-19 ENCOUNTER — Encounter (HOSPITAL_COMMUNITY)
Admission: RE | Disposition: A | Discharge status: Home / Self Care | Payer: Medicare Other | Source: Home / Self Care | Attending: Cardiology

## 2019-12-19 ENCOUNTER — Encounter (HOSPITAL_COMMUNITY): Payer: Self-pay | Admitting: Cardiology

## 2019-12-19 DIAGNOSIS — I10 Essential (primary) hypertension: Secondary | ICD-10-CM | POA: Diagnosis not present

## 2019-12-19 DIAGNOSIS — E785 Hyperlipidemia, unspecified: Secondary | ICD-10-CM | POA: Diagnosis not present

## 2019-12-19 DIAGNOSIS — H269 Unspecified cataract: Secondary | ICD-10-CM | POA: Insufficient documentation

## 2019-12-19 DIAGNOSIS — I700 Atherosclerosis of aorta: Secondary | ICD-10-CM | POA: Diagnosis not present

## 2019-12-19 DIAGNOSIS — Z9852 Vasectomy status: Secondary | ICD-10-CM | POA: Diagnosis not present

## 2019-12-19 DIAGNOSIS — G4733 Obstructive sleep apnea (adult) (pediatric): Secondary | ICD-10-CM | POA: Diagnosis not present

## 2019-12-19 DIAGNOSIS — K219 Gastro-esophageal reflux disease without esophagitis: Secondary | ICD-10-CM | POA: Diagnosis not present

## 2019-12-19 DIAGNOSIS — J4520 Mild intermittent asthma, uncomplicated: Secondary | ICD-10-CM | POA: Diagnosis not present

## 2019-12-19 DIAGNOSIS — Z87891 Personal history of nicotine dependence: Secondary | ICD-10-CM | POA: Diagnosis not present

## 2019-12-19 DIAGNOSIS — I340 Nonrheumatic mitral (valve) insufficiency: Secondary | ICD-10-CM | POA: Diagnosis not present

## 2019-12-19 DIAGNOSIS — Z79899 Other long term (current) drug therapy: Secondary | ICD-10-CM | POA: Insufficient documentation

## 2019-12-19 DIAGNOSIS — Z8249 Family history of ischemic heart disease and other diseases of the circulatory system: Secondary | ICD-10-CM | POA: Insufficient documentation

## 2019-12-19 DIAGNOSIS — E7800 Pure hypercholesterolemia, unspecified: Secondary | ICD-10-CM | POA: Insufficient documentation

## 2019-12-19 DIAGNOSIS — I083 Combined rheumatic disorders of mitral, aortic and tricuspid valves: Secondary | ICD-10-CM | POA: Diagnosis not present

## 2019-12-19 DIAGNOSIS — I493 Ventricular premature depolarization: Secondary | ICD-10-CM | POA: Diagnosis not present

## 2019-12-19 HISTORY — PX: TEE WITHOUT CARDIOVERSION: SHX5443

## 2019-12-19 HISTORY — DX: Sleep apnea, unspecified: G47.30

## 2019-12-19 SURGERY — ECHOCARDIOGRAM, TRANSESOPHAGEAL
Anesthesia: Monitor Anesthesia Care

## 2019-12-19 MED ORDER — LIDOCAINE 2% (20 MG/ML) 5 ML SYRINGE
INTRAMUSCULAR | Status: DC | PRN
  Administered 2019-12-19: 20 mg via INTRAVENOUS

## 2019-12-19 MED ORDER — SODIUM CHLORIDE 0.9 % IV SOLN: INTRAVENOUS | Status: DC

## 2019-12-19 MED ORDER — LACTATED RINGERS IV SOLN: INTRAVENOUS | Status: DC

## 2019-12-19 MED ORDER — PROPOFOL 10 MG/ML IV BOLUS
INTRAVENOUS | Status: DC | PRN
  Administered 2019-12-19 (×2): 20 mg via INTRAVENOUS

## 2019-12-19 MED ORDER — PROPOFOL 500 MG/50ML IV EMUL
INTRAVENOUS | Status: DC | PRN
  Administered 2019-12-19: 100 ug/kg/min via INTRAVENOUS

## 2019-12-19 MED ORDER — LACTATED RINGERS IV SOLN: INTRAVENOUS | Status: DC | PRN

## 2019-12-19 NOTE — Discharge Instructions (Signed)

## 2019-12-19 NOTE — CV Procedure (Addendum)
° ° °  TRANSESOPHAGEAL ECHOCARDIOGRAM   NAME:  Patrick Bell   MRN: [id number] DOB:  [birth year]   ADMIT DATE: 12/19/2019  INDICATIONS:   PROCEDURE:   Informed consent was obtained prior to the procedure. The risks, benefits and alternatives for the procedure were discussed and the patient comprehended these risks.  Risks include, but are not limited to, cough, sore throat, vomiting, nausea, somnolence, esophageal and stomach trauma or perforation, bleeding, low blood pressure, aspiration, pneumonia, infection, trauma to the teeth and death.    Procedural time out performed. He received 20 mg IV lidocaine and 208 mg propofol. Monitored anesthesia care provided under the supervision of Dr. Glennon Mac.   The transesophageal probe was inserted in the esophagus and stomach without difficulty and multiple views were obtained.    COMPLICATIONS:    There were no immediate complications.  FINDINGS:  LEFT VENTRICLE: EF = 60-65%. No regional wall motion abnormalities.  RIGHT VENTRICLE: Normal size and function.   LEFT ATRIUM: No thrombus/mass.  LEFT ATRIAL APPENDAGE: No thrombus/mass.   RIGHT ATRIUM: No thrombus/mass.  AORTIC VALVE:  Trileaflet. Trivial regurgitation. No vegetation.  MITRAL VALVE:    Overall normal structure except for flail portion of posterior leaflet. No clear jet to get PISA measurements, but eccentric jet with Coanda effect suggests severe MR. Sampled pulmonary vein had some mild systolic flow reversal; this lack of prominent flow reversal may be due to eccentricity of the jet. See 3D images. Appears that P2 prolapses and P1 has partial flail leaflet.  TRICUSPID VALVE: Normal structure. Trivial regurgitation. No vegetation.  PULMONIC VALVE: Grossly normal structure. Trivial regurgitation. No apparent vegetation.  INTERATRIAL SEPTUM: No PFO or ASD seen by color Doppler.  PERICARDIUM: No effusion noted.  DESCENDING AORTA: Mild diffuse plaque  seen  CONCLUSION: Abnormalities of the posterior mitral valve leaflet. Appears to have prolapsed P2 segment and partial flail P1 segment leading to eccentric severe MR.  Buford Dresser, MD, PhD [facility]  [address], [place], [place] [address] [phone]   9:38 AM

## 2019-12-19 NOTE — Interval H&P Note (Signed)
History and Physical Interval Note:  12/19/2019 8:56 AM  Patrick Bell  has presented today for surgery, with the diagnosis of SEVERE MR.  The various methods of treatment have been discussed with the patient and family. After consideration of risks, benefits and other options for treatment, the patient has consented to  Procedure(s): TRANSESOPHAGEAL ECHOCARDIOGRAM (TEE) (N/A) as a surgical intervention.  The patient's history has been reviewed, patient examined, no change in status, stable for surgery.  I have reviewed the patient's chart and labs.  Questions were answered to the patient's satisfaction.     Roena Sassaman Harrell Gave

## 2019-12-19 NOTE — Anesthesia Postprocedure Evaluation (Signed)
Anesthesia Post Note  Patient: Patrick Bell  Procedure(s) Performed: TRANSESOPHAGEAL ECHOCARDIOGRAM (TEE) (N/A )     Patient location during evaluation: Endoscopy Anesthesia Type: MAC Level of consciousness: awake and alert, patient cooperative and oriented Pain management: pain level controlled Vital Signs Assessment: post-procedure vital signs reviewed and stable Respiratory status: spontaneous breathing, nonlabored ventilation and respiratory function stable Cardiovascular status: stable and blood pressure returned to baseline Postop Assessment: no apparent nausea or vomiting Anesthetic complications: no   No complications documented.  Last Vitals:  Vitals:   12/19/19 0942 12/19/19 0954  BP: (!) 112/52 124/67  Pulse: 62 65  Resp: 12 18  Temp: (!) 35.7 C   SpO2: 97% 98%    Last Pain:  Vitals:   12/19/19 0954  TempSrc:   PainSc: 0-No pain                 Marvyn Torrez,E. Rayson Rando

## 2019-12-19 NOTE — Progress Notes (Signed)
  Echocardiogram Echocardiogram Transesophageal has been performed.  Viet Kemmerer G Monik Lins 12/19/2019, 10:22 AM

## 2019-12-19 NOTE — Anesthesia Procedure Notes (Signed)
Procedure Name: MAC Date/Time: 12/19/2019 9:11 AM Performed by: Leonor Liv, CRNA Pre-anesthesia Checklist: Patient identified, Emergency Drugs available, Suction available, Patient being monitored and Timeout performed Patient Re-evaluated:Patient Re-evaluated prior to induction Oxygen Delivery Method: Nasal cannula Airway Equipment and Method: Bite block Placement Confirmation: positive ETCO2 Dental Injury: Teeth and Oropharynx as per pre-operative assessment

## 2019-12-19 NOTE — Transfer of Care (Signed)
Immediate Anesthesia Transfer of Care Note  Patient: Patrick Bell  Procedure(s) Performed: TRANSESOPHAGEAL ECHOCARDIOGRAM (TEE) (N/A )  Patient Location: Endoscopy Unit  Anesthesia Type:MAC  Level of Consciousness: drowsy  Airway & Oxygen Therapy: Patient Spontanous Breathing and Patient connected to nasal cannula oxygen  Post-op Assessment: Report given to RN and Post -op Vital signs reviewed and stable  Post vital signs: Reviewed and stable  Last Vitals:  Vitals Value Taken Time  BP 112/52 12/19/19 0942  Temp    Pulse 61 12/19/19 0943  Resp 17 12/19/19 0943  SpO2 97 % 12/19/19 0943  Vitals shown include unvalidated device data.  Last Pain:  Vitals:   12/19/19 0829  TempSrc: Oral  PainSc: 0-No pain         Complications: No complications documented.

## 2019-12-19 NOTE — Anesthesia Preprocedure Evaluation (Addendum)
Anesthesia Evaluation  Patient identified by MRN, date of birth, ID band Patient awake    Reviewed: Allergy & Precautions, NPO status , Patient's Chart, lab work & pertinent test results  History of Anesthesia Complications Negative for: history of anesthetic complications  Airway Mallampati: I  TM Distance: >3 FB Neck ROM: Full    Dental  (+) Caps, Dental Advisory Given   Pulmonary COPD,  COPD inhaler, former smoker,  12/16/2019 SARS coronavirus NEG   breath sounds clear to auscultation       Cardiovascular hypertension, Pt. on medications (-) angina Rhythm:Regular Rate:Normal + Systolic murmurs 04/8561 ECHO: EF 60-65%, severe MR   Neuro/Psych negative neurological ROS     GI/Hepatic Neg liver ROS, GERD  Medicated and Controlled,  Endo/Other  negative endocrine ROS  Renal/GU negative Renal ROS     Musculoskeletal   Abdominal   Peds  Hematology negative hematology ROS (+)   Anesthesia Other Findings   Reproductive/Obstetrics                             Anesthesia Physical Anesthesia Plan  ASA: III  Anesthesia Plan: MAC   Post-op Pain Management:    Induction:   PONV Risk Score and Plan: 1  Airway Management Planned: Natural Airway and Nasal Cannula  Additional Equipment:   Intra-op Plan:   Post-operative Plan:   Informed Consent: I have reviewed the patients History and Physical, chart, labs and discussed the procedure including the risks, benefits and alternatives for the proposed anesthesia with the patient or authorized representative who has indicated his/her understanding and acceptance.     Dental advisory given  Plan Discussed with: CRNA and Surgeon  Anesthesia Plan Comments:         Anesthesia Quick Evaluation

## 2019-12-20 ENCOUNTER — Encounter (HOSPITAL_COMMUNITY): Payer: Self-pay | Admitting: Cardiology

## 2019-12-23 ENCOUNTER — Telehealth: Payer: Self-pay | Admitting: Cardiovascular Disease

## 2019-12-23 NOTE — Telephone Encounter (Signed)
° ° °  Pt would like to schedule split night study, staff message sent to [facility]

## 2019-12-25 ENCOUNTER — Telehealth: Payer: Self-pay | Admitting: *Deleted

## 2019-12-25 NOTE — Telephone Encounter (Signed)
Sleep study appointment details left on voice mail. Also informed patient details will also be shown in [facility].

## 2019-12-26 DIAGNOSIS — M9905 Segmental and somatic dysfunction of pelvic region: Secondary | ICD-10-CM | POA: Diagnosis not present

## 2019-12-26 DIAGNOSIS — M9904 Segmental and somatic dysfunction of sacral region: Secondary | ICD-10-CM | POA: Diagnosis not present

## 2019-12-26 DIAGNOSIS — M5137 Other intervertebral disc degeneration, lumbosacral region: Secondary | ICD-10-CM | POA: Diagnosis not present

## 2019-12-26 DIAGNOSIS — M9903 Segmental and somatic dysfunction of lumbar region: Secondary | ICD-10-CM | POA: Diagnosis not present

## 2020-01-06 ENCOUNTER — Other Ambulatory Visit: Payer: Self-pay

## 2020-01-06 ENCOUNTER — Ambulatory Visit (INDEPENDENT_AMBULATORY_CARE_PROVIDER_SITE_OTHER): Payer: Medicare Other

## 2020-01-06 DIAGNOSIS — Z23 Encounter for immunization: Secondary | ICD-10-CM | POA: Diagnosis not present

## 2020-01-07 ENCOUNTER — Telehealth: Payer: Self-pay

## 2020-01-07 ENCOUNTER — Encounter: Payer: Self-pay | Admitting: Physician Assistant

## 2020-01-07 ENCOUNTER — Telehealth (INDEPENDENT_AMBULATORY_CARE_PROVIDER_SITE_OTHER): Payer: Medicare Other | Admitting: Physician Assistant

## 2020-01-07 VITALS — BP 153/86 | HR 80

## 2020-01-07 DIAGNOSIS — I340 Nonrheumatic mitral (valve) insufficiency: Secondary | ICD-10-CM | POA: Diagnosis not present

## 2020-01-07 DIAGNOSIS — E785 Hyperlipidemia, unspecified: Secondary | ICD-10-CM | POA: Diagnosis not present

## 2020-01-07 DIAGNOSIS — I10 Essential (primary) hypertension: Secondary | ICD-10-CM

## 2020-01-07 MED ORDER — LOSARTAN POTASSIUM 50 MG PO TABS: 50.0000 mg | ORAL_TABLET | Freq: Every day | ORAL | 1 refills | Status: DC

## 2020-01-07 NOTE — Progress Notes (Signed)
Virtual Visit via Telephone Note   This visit type was conducted due to national recommendations for restrictions regarding the COVID-19 Pandemic (e.g. social distancing) in an effort to limit this patient's exposure and mitigate transmission in our community.  Due to his co-morbid illnesses, this patient is at least at moderate risk for complications without adequate follow up.  This format is felt to be most appropriate for this patient at this time.  The patient did not have access to video technology/had technical difficulties with video requiring transitioning to audio format only (telephone).  All issues noted in this document were discussed and addressed.  No physical exam could be performed with this format.  Please refer to the patient's chart for his  consent to telehealth for [facility].    Date:  01/07/2020   ID:  Patrick Bell, DOB 01/23/1941, MRN [id number] The patient was identified using 2 identifiers.  Patient Location: Home Provider Location: Office/Clinic  PCP:  Dettinger, Fransisca Kaufmann, MD  Cardiologist:  Shelva Majestic, MD  Electrophysiologist:  None   Evaluation Performed:  Follow-Up Visit  Chief Complaint:  Follow up  History of Present Illness:    Patrick Bell is a 79 y.o. male with past medical history of hypertension, hyperlipidemia, and obstructive sleep apnea.  Echocardiogram performed in August 2021 revealed a normal EF of 60 to 65%, indeterminant diastolic parameter, normal RV, mild LA dilatation, severe MR with moderate holosystolic prolapse.  He was seen by Dr. Claiborne Billings on 12/12/2019 who recommended TEE.  His TEE procedure was performed by Dr. Buford Dresser on 12/19/2019 which revealed EF 60 to 65%, he had prolapse of P2 segment and partial flail of P1 segment of the mitral valve with severe MR.  Patient presents today for post ED follow-up.  We discussed the recent TEE result.  He does have some dizziness however he attributed the dizziness to the  elevated blood pressure.  I will increase his losartan to 50 mg daily.  He has chronic history of asthma, baseline dyspnea has not changed recently.  Overall he is largely asymptomatic from the valve perspective.  We discussed 2 options, one option would be to refer him to Dr. Roxy Manns for consideration of mitral valve repair.  In this case, Dr. Roxy Manns likely will want a left and right heart cath.  Second option would be to repeat echocardiogram in 6 months if he really prefer not to have the surgery.  I discussed the case with DOD Dr. Audie Box since Dr. Claiborne Billings was out of the office.  Dr. Audie Box would recommend early referral to CT surgery to discuss possible treatment options.  Patient was agreeable to proceed with cardiothoracic surgery.  The patient does not have symptoms concerning for COVID-19 infection (fever, chills, cough, or new shortness of breath).    Past Medical History:  Diagnosis Date  . Allergy   . Asthma   . Cataract   . Cataract   . Diverticulitis   . GERD (gastroesophageal reflux disease)   . Hyperlipidemia   . Hypertension   . Sleep apnea    Past Surgical History:  Procedure Laterality Date  . bilateral inguinal hernia    . COLONOSCOPY  06/09/2004   ZGY:FVCBSWH colon diverticulosis, otherwise normal colonoscopy  . EYE SURGERY  08/2014  . HERNIA REPAIR    . PALATE / UVULA BIOPSY / EXCISION    . TEE WITHOUT CARDIOVERSION N/A 12/19/2019   Procedure: TRANSESOPHAGEAL ECHOCARDIOGRAM (TEE);  Surgeon: Buford Dresser, MD;  Location: [facility]  ENDOSCOPY;  Service: Cardiovascular;  Laterality: N/A;  . TONSILLECTOMY    . VASECTOMY       Current Meds  Medication Sig  . fexofenadine-pseudoephedrine (ALLEGRA-D) 60-120 MG 12 hr tablet Take 1 po bid (Patient taking differently: Take 1 tablet by mouth daily as needed (allergies). )  . losartan (COZAAR) 25 MG tablet Take 1 tablet (25 mg total) by mouth daily.  . montelukast (SINGULAIR) 10 MG tablet Take 1 tablet (10 mg total) daily by  mouth. (Patient taking differently: Take 10 mg by mouth at bedtime. )  . Olopatadine HCl (PATADAY) 0.2 % SOLN INSTILL 1 DROP IN [facility] EYE TWICE DAILY (Patient taking differently: Place 1 drop into both eyes daily as needed (allergies). )  . pantoprazole (PROTONIX) 40 MG tablet Take 1 tablet (40 mg total) daily by mouth. (Patient taking differently: Take 40 mg by mouth at bedtime. )  . PROAIR HFA 108 (90 Base) MCG/ACT inhaler Inhale 2 puffs into the lungs every 6 (six) hours as needed for wheezing. (Patient taking differently: Inhale 2 puffs into the lungs every 6 (six) hours as needed for wheezing. )  . simvastatin (ZOCOR) 10 MG tablet Take 1 tablet (10 mg total) by mouth at bedtime.     Allergies:   Patient has no known allergies.   Social History   Tobacco Use  . Smoking status: Former Smoker    Packs/day: 4.00    Years: 25.00    Pack years: 100.00    Types: Cigarettes    Quit date: 06/23/1982    Years since quitting: 37.5  . Smokeless tobacco: Never Used  . 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 Vaping Use: Never used  Substance Use Topics  . Alcohol use: Yes    Alcohol/week: 5.0 standard drinks    Types: 5 Shots of liquor per week    Comment: brandy a few times a month, beer once a month  . Drug use: No     Family Hx: The patient's family history includes Arthritis in his brother, father, and sister; Dementia (age of onset: 59) in his mother; Heart disease in his father and mother; Hip fracture (age of onset: 59) in his mother; Osteoporosis in his mother. There is no history of Colon cancer.  ROS:   Please see the history of present illness.     All other systems reviewed and are negative.   Prior CV studies:   The following studies were reviewed today:  TEE 12/19/2019 IMPRESSIONS    1. Left ventricular ejection fraction, by estimation, is 60 to 65%. The  left ventricle has normal function. The left ventricle has no regional  wall motion  abnormalities.  2. Right ventricular systolic function is normal. The right ventricular  size is normal.  3. No left atrial/left atrial appendage thrombus was detected.  4. Overall normal structure except for flail portion of posterior  leaflet. No clear jet to get PISA measurements, but eccentric jet with  Coanda effect suggests severe MR. Sampled pulmonary vein had some mild  systolic flow reversal; this lack of  prominent flow reversal may be due to eccentricity of the jet. See 3D  images. Appears that P2 prolapses and P1 has partial flail leaflet.. The  mitral valve is abnormal. Severe mitral valve regurgitation. No evidence  of mitral stenosis. There is severe  prolapse of of the mitral valve.  5. The aortic valve is tricuspid. Aortic valve regurgitation is trivial.  No aortic stenosis  is present.  6. There is mild (Grade II) plaque involving the descending aorta.   Conclusion(s)/Recommendation(s): Abnormalities of the posterior mitral  valve leaflet. Appears to have prolapsed P2 segment and partial flail P1  segment leading to eccentric severe MR.   Labs/Other Tests and Data Reviewed:    EKG:  An ECG dated 12/12/2019 was personally reviewed today and demonstrated:  Sinus rhythm with PACs.  Recent Labs: 11/14/2019: ALT 10 12/13/2019: BUN 17; Creatinine, Ser 0.86; Hemoglobin 15.7; Platelets 197; Potassium 4.3; Sodium 141   Recent Lipid Panel Lab Results  Component Value Date/Time   CHOL 184 11/14/2019 10:04 AM   CHOL 168 06/18/2012 10:31 AM   TRIG 87 11/14/2019 10:04 AM   TRIG 101 01/14/2013 12:18 PM   TRIG 102 06/18/2012 10:31 AM   HDL 70 11/14/2019 10:04 AM   HDL 65 01/14/2013 12:18 PM   HDL 62 06/18/2012 10:31 AM   CHOLHDL 2.6 11/14/2019 10:04 AM   LDLCALC 98 11/14/2019 10:04 AM   LDLCALC 99 01/14/2013 12:18 PM   LDLCALC 86 06/18/2012 10:31 AM    Wt Readings from Last 3 Encounters:  12/19/19 184 lb (83.5 kg)  12/12/19 184 lb (83.5 kg)  11/14/19 186 lb (84.4  kg)     Objective:    Vital Signs:  BP (!) 153/86   Pulse 80    VITAL SIGNS:  reviewed  ASSESSMENT & PLAN:    1. Severe MR: We discussed various options, I also discussed the case with Dr. Audie Box, we recommend refer to Dr. Roxy Manns to consider mitral valve surgery.  If he is agreeable to proceed with surgery, then we will proceed with left and right heart cath.  2. Hypertension: Blood pressure mildly elevated today.  I will increase losartan to 50 mg daily  3. Hyperlipidemia: Continue Zocor  COVID-19 Education: The signs and symptoms of COVID-19 were discussed with the patient and how to seek care for testing (follow up with PCP or arrange E-visit).  The importance of social distancing was discussed today.  Time:   Today, I have spent 20 minutes with the patient with telehealth technology discussing the above problems.     Medication Adjustments/Labs and Tests Ordered: Current medicines are reviewed at length with the patient today.  Concerns regarding medicines are outlined above.   Tests Ordered: No orders of the defined types were placed in this encounter.   Medication Changes: No orders of the defined types were placed in this encounter.   Follow Up:  In Person in 3 month(s)  Signed, Almyra Deforest, [place]  01/07/2020 10:19 AM    [place]

## 2020-01-07 NOTE — Patient Instructions (Addendum)
Medication Instructions:  Increase your losartan to 50 mg daily *If you need a refill on your cardiac medications before your next appointment, please call your pharmacy*   Lab Work: None ordered If you have labs (blood work) drawn today and your tests are completely normal, you will receive your results only by: Marland Kitchen MyChart Message (if you have MyChart) OR . A paper copy in the mail If you have any lab test that is abnormal or we need to change your treatment, we will call you to review the results.   Testing/Procedures: None ordered   Follow-Up: At [facility], you and your health needs are our priority.  As part of our continuing mission to provide you with exceptional heart care, we have created designated Provider Care Teams.  These Care Teams include your primary Cardiologist (physician) and Advanced Practice Providers (APPs -  Physician Assistants and Nurse Practitioners) who all work together to provide you with the care you need, when you need it.  We recommend signing up for the patient portal called "MyChart".  Sign up information is provided on this After Visit Summary.  MyChart is used to connect with patients for Virtual Visits (Telemedicine).  Patients are able to view lab/test results, encounter notes, upcoming appointments, etc.  Non-urgent messages can be sent to your provider as well.   To learn more about what you can do with MyChart, go to [email or web address].    Your next appointment:   05/11/20 at 3:40 p.m. (please arrive 20 minutes before your appointment to check in.)  The format for your next appointment:   In Person  Provider:   Shelva Majestic, MD   Other Instructions None

## 2020-01-07 NOTE — Telephone Encounter (Signed)
  Patient Consent for Virtual Visit         Patrick Bell has provided verbal consent on 01/07/2020 for a virtual visit (video or telephone).   CONSENT FOR VIRTUAL VISIT FOR:  Patrick Bell  By participating in this virtual visit I agree to the following:  I hereby voluntarily request, consent and authorize [facility] and its employed or contracted physicians, physician assistants, nurse practitioners or other licensed health care professionals (the Practitioner), to provide me with telemedicine health care services (the "Services") as deemed necessary by the treating Practitioner. I acknowledge and consent to receive the Services by the Practitioner via telemedicine. I understand that the telemedicine visit will involve communicating with the Practitioner through live audiovisual communication technology and the disclosure of certain medical information by electronic transmission. I acknowledge that I have been given the opportunity to request an in-person assessment or other available alternative prior to the telemedicine visit and am voluntarily participating in the telemedicine visit.  I understand that I have the right to withhold or withdraw my consent to the use of telemedicine in the course of my care at any time, without affecting my right to future care or treatment, and that the Practitioner or I may terminate the telemedicine visit at any time. I understand that I have the right to inspect all information obtained and/or recorded in the course of the telemedicine visit and may receive copies of available information for a reasonable fee.  I understand that some of the potential risks of receiving the Services via telemedicine include:  Marland Kitchen Delay or interruption in medical evaluation due to technological equipment failure or disruption; . Information transmitted may not be sufficient (e.g. poor resolution of images) to allow for appropriate medical decision making by the Practitioner;  and/or  . In rare instances, security protocols could fail, causing a breach of personal health information.  Furthermore, I acknowledge that it is my responsibility to provide information about my medical history, conditions and care that is complete and accurate to the best of my ability. I acknowledge that Practitioner's advice, recommendations, and/or decision may be based on factors not within their control, such as incomplete or inaccurate data provided by me or distortions of diagnostic images or specimens that may result from electronic transmissions. I understand that the practice of medicine is not an exact science and that Practitioner makes no warranties or guarantees regarding treatment outcomes. I acknowledge that a copy of this consent can be made available to me via my patient portal ([facility]), or I can request a printed copy by calling the office of [facility].    I understand that my insurance will be billed for this visit.   I have read or had this consent read to me. . I understand the contents of this consent, which adequately explains the benefits and risks of the Services being provided via telemedicine.  . I have been provided ample opportunity to ask questions regarding this consent and the Services and have had my questions answered to my satisfaction. . I give my informed consent for the services to be provided through the use of telemedicine in my medical care

## 2020-01-07 NOTE — Telephone Encounter (Signed)
Patient returning call.

## 2020-01-07 NOTE — Telephone Encounter (Signed)
Attempted to reach patient to discuss after visit summary for virtual visit with Almyra Deforest, PA-C on 01/07/20. Left message to call back.

## 2020-01-08 DIAGNOSIS — M9905 Segmental and somatic dysfunction of pelvic region: Secondary | ICD-10-CM | POA: Diagnosis not present

## 2020-01-08 DIAGNOSIS — M9903 Segmental and somatic dysfunction of lumbar region: Secondary | ICD-10-CM | POA: Diagnosis not present

## 2020-01-08 DIAGNOSIS — M9904 Segmental and somatic dysfunction of sacral region: Secondary | ICD-10-CM | POA: Diagnosis not present

## 2020-01-08 DIAGNOSIS — M5137 Other intervertebral disc degeneration, lumbosacral region: Secondary | ICD-10-CM | POA: Diagnosis not present

## 2020-01-09 ENCOUNTER — Other Ambulatory Visit: Payer: Self-pay | Admitting: Thoracic Surgery (Cardiothoracic Vascular Surgery)

## 2020-01-09 ENCOUNTER — Encounter: Payer: Self-pay | Admitting: Thoracic Surgery (Cardiothoracic Vascular Surgery)

## 2020-01-09 ENCOUNTER — Other Ambulatory Visit: Payer: Self-pay

## 2020-01-09 ENCOUNTER — Institutional Professional Consult (permissible substitution): Payer: Medicare Other | Admitting: Thoracic Surgery (Cardiothoracic Vascular Surgery)

## 2020-01-09 VITALS — BP 150/89 | HR 87 | Temp 97.3°F | Resp 20 | Ht 70.0 in | Wt 183.4 lb

## 2020-01-09 DIAGNOSIS — I340 Nonrheumatic mitral (valve) insufficiency: Secondary | ICD-10-CM | POA: Diagnosis not present

## 2020-01-09 NOTE — Progress Notes (Signed)
HEART AND [place] SURGERY CONSULTATION REPORT  Referring Provider is Almyra Deforest, [place] Primary Cardiologist is Shelva Majestic, MD PCP is Dettinger, Fransisca Kaufmann, MD  Chief Complaint  Patient presents with  . Mitral Regurgitation    new patient consultation, [facility] TEE 12/19/19    HPI:  Patient is an 79 year old male with history of hypertension, obstructive sleep apnea, hyperlipidemia, GE reflux disease, and remote history of heavy tobacco use who has been referred for surgical consultation to discuss treatment options for management of recently discovered mitral valve prolapse with severe mitral regurgitation.  Patient has remained physically active and reasonably healthy all of his adult life.  He has been treated for essential hypertension and obstructive sleep apnea.  Several years ago he tried using CPAP but has stopped since then.  He reports a 6 to 77-month history of change in exercise tolerance with the development of exertional shortness of breath that typically occurs only with more strenuous physical exertion.  He has blamed this on a tendency that he has had to be less physically active.  He was recently noted to have a new prominent systolic murmur on physical exam by his primary care physician.  Transthoracic echocardiogram was performed demonstrating normal left ventricular function with mitral valve prolapse and severe mitral regurgitation.  The patient was referred for cardiology consultation and recently evaluated by Dr. Claiborne Billings.  The patient subsequently underwent TEE which confirmed the presence of mitral valve prolapse with flail segment involving a portion of the posterior leaflet and severe mitral regurgitation.  Cardiothoracic surgical consultation was requested.  Patient is married and lives locally in [place] with his wife.  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   He has remained reasonably active physically throughout his adult life and in retirement.  He does not exercise on a regular basis but he regularly performs fairly strenuous activities when he is working on his property or around the house.  He states that he has noticed a tendency to get short of breath with more strenuous physical exertion.  This is developed over the past 6 to 12 months.  He denies resting shortness of breath, PND, or orthopnea.  He does note that breathing is worse when he lays flat in bed and he has also developed a persistent dry nonproductive cough.  He has had some atypical pains across the left chest which are not necessarily related to exertion.  These pains are described as a dull mild aching sensation that seem to come and go sporadically.  He has not had any palpitations or syncope.  He has had some dizzy spells that has been attributed to changes in his blood pressure medications.  Past Medical History:  Diagnosis Date  . Allergy   . Asthma   . Cataract   . Cataract   . Diverticulitis   . GERD (gastroesophageal reflux disease)   . Hyperlipidemia   . Hypertension   . Sleep apnea     Past Surgical History:  Procedure Laterality Date  . bilateral inguinal hernia    . COLONOSCOPY  06/09/2004   CHE:NIDPOEU colon diverticulosis, otherwise normal colonoscopy  . EYE SURGERY  08/2014  . HERNIA REPAIR    . PALATE / UVULA BIOPSY / EXCISION    . TEE WITHOUT CARDIOVERSION N/A 12/19/2019   Procedure: TRANSESOPHAGEAL ECHOCARDIOGRAM (TEE);  Surgeon: Buford Dresser, MD;  Location: [facility];  Service: Cardiovascular;  Laterality:  N/A;  . TONSILLECTOMY    . VASECTOMY      Family History  Problem Relation Age of Onset  . Heart disease Mother   . Hip fracture Mother 89  . Osteoporosis Mother   . Dementia Mother 33  . Arthritis Father   . Heart disease Father   . Arthritis Sister   . Arthritis Brother   . Colon cancer Neg Hx     Social History    Socioeconomic History  . Marital status: Married    Spouse name: Wells Guiles  . Number of children: 2  . Years of education: 78  . Highest education level: Some college, no degree  Occupational History  . 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 Smoking status: Former Smoker    Packs/day: 4.00    Years: 25.00    Pack years: 100.00    Types: Cigarettes    Quit date: 06/23/1982    Years since quitting: 37.5  . Smokeless tobacco: Never Used  . 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 Vaping Use: Never used  Substance and Sexual Activity  . Alcohol use: Yes    Alcohol/week: 5.0 standard drinks    Types: 5 Shots of liquor per week    Comment: brandy a few times a month, beer once a month  . Drug use: No  . Sexual activity: Yes    Birth control/protection: Surgical  Other Topics Concern  . Not on file  Social History Narrative  . Not on file   Social Determinants of Health   Financial Resource Strain:   . Difficulty of Paying Living Expenses: Not on file  Food Insecurity:   . Worried About Charity fundraiser in the Last Year: Not on file  . Ran Out of Food in the Last Year: Not on file  Transportation Needs:   . Lack of Transportation (Medical): Not on file  . Lack of Transportation (Non-Medical): Not on file  Physical Activity: Sufficiently Active  . Days of Exercise per Week: 7 days  . Minutes of Exercise per Session: 60 min  Stress: No Stress Concern Present  . Feeling of Stress : Not at all  Social Connections:   . Frequency of Communication with Friends and Family: Not on file  . Frequency of Social Gatherings with Friends and Family: Not on file  . Attends Religious Services: Not on file  . Active Member of Clubs or Organizations: Not on file  . Attends Archivist Meetings: Not on file  . Marital Status: Not on file  Intimate Partner Violence:   . Fear of Current or Ex-Partner: Not on  file  . Emotionally Abused: Not on file  . Physically Abused: Not on file  . Sexually Abused: Not on file    Current Outpatient Medications  Medication Sig Dispense Refill  . fexofenadine-pseudoephedrine (ALLEGRA-D) 60-120 MG 12 hr tablet Take 1 po bid (Patient taking differently: Take 1 tablet by mouth daily as needed (allergies). ) 60 tablet 5  . losartan (COZAAR) 50 MG tablet Take 1 tablet (50 mg total) by mouth daily. 90 tablet 1  . montelukast (SINGULAIR) 10 MG tablet Take 1 tablet (10 mg total) daily by mouth. (Patient taking differently: Take 10 mg by mouth at bedtime. ) 90 tablet 3  . Olopatadine HCl (PATADAY) 0.2 % SOLN INSTILL 1 DROP IN [facility] EYE TWICE DAILY (Patient taking differently: Place  1 drop into both eyes daily as needed (allergies). ) 2.5 mL 1  . pantoprazole (PROTONIX) 40 MG tablet Take 1 tablet (40 mg total) daily by mouth. (Patient taking differently: Take 40 mg by mouth at bedtime. ) 90 tablet 3  . PROAIR HFA 108 (90 Base) MCG/ACT inhaler Inhale 2 puffs into the lungs every 6 (six) hours as needed for wheezing. (Patient taking differently: Inhale 2 puffs into the lungs every 6 (six) hours as needed for wheezing. ) 8.5 g 2  . simvastatin (ZOCOR) 10 MG tablet Take 1 tablet (10 mg total) by mouth at bedtime. 90 tablet 3   No current facility-administered medications for this visit.    No Known Allergies    Review of Systems:   General:  normal appetite, normal energy, no weight gain, no weight loss, no fever  Cardiac:  no chest pain with exertion, occasional brief episodes chest pain at rest, +SOB with exertion, no resting SOB, no PND, no orthopnea, no palpitations, no arrhythmia, no atrial fibrillation, no LE edema, + dizzy spells, no syncope  Respiratory:  exertional shortness of breath, no home oxygen, no productive cough, + persistent dry cough, no bronchitis, + wheezing, no hemoptysis, + asthma, no pain with inspiration or cough, + sleep apnea, no CPAP at  night  GI:   no difficulty swallowing, + reflux, no frequent heartburn, no hiatal hernia, no abdominal pain, no constipation, + diarrhea, no hematochezia, no hematemesis, no melena  GU:   no dysuria,  + frequency, no urinary tract infection, no hematuria, no enlarged prostate, no kidney stones, no kidney disease  Vascular:  no pain suggestive of claudication, no pain in feet, no leg cramps, no varicose veins, no DVT, no non-healing foot ulcer  Neuro:   no stroke, no TIA's, no seizures, no headaches, no temporary blindness one eye,  no slurred speech, no peripheral neuropathy, + chronic pain in lower back, no instability of gait, no memory/cognitive dysfunction  Musculoskeletal: + arthritis, no joint swelling, no myalgias, no difficulty walking, normal mobility   Skin:   no rash, no itching, no skin infections, no pressure sores or ulcerations  Psych:   no anxiety, no depression, no nervousness, no unusual recent stress  Eyes:   no blurry vision, no floaters, no recent vision changes, + wears glasses or contacts  ENT:   + hearing loss, no loose or painful teeth, no dentures, last saw dentist within the past year  Hematologic:  no easy bruising, no abnormal bleeding, no clotting disorder, no frequent epistaxis  Endocrine:  no diabetes, does not check CBG's at home  Vaccination:  + vaccine for COVID19 and recent flu vaccine           Physical Exam:   BP (!) 150/89 (BP Location: Left Arm, Patient Position: Sitting, Cuff Size: Normal)   Pulse 87   Temp (!) 97.3 F (36.3 C) (Skin)   Resp 20   Ht 5\' 10"  (1.778 m)   Wt 183 lb 6.4 oz (83.2 kg)   SpO2 96% Comment: RA  BMI 26.32 kg/m   General:    well-appearing  HEENT:  Unremarkable   Neck:   no JVD, no bruits, no adenopathy   Chest:   clear to auscultation, symmetrical breath sounds, no wheezes, no rhonchi   CV:   RRR, grade IV/VI holosystolic murmur heard best at apex,  no diastolic murmur  Abdomen:  soft, non-tender, no masses    Extremities:  warm, well-perfused, pulses palpable, no LE  edema  Rectal/GU  Deferred  Neuro:   Grossly non-focal and symmetrical throughout  Skin:   Clean and dry, no rashes, no breakdown   Diagnostic Tests:  EKG: NSR w/ occasional PVC's and PAC's    ECHOCARDIOGRAM REPORT       Patient Name:  Patrick Bell Date of Exam: 11/25/2019  Medical Rec #: [id number]    Height:    69.0 in  Accession #:  [id number]   Weight:    186.0 lb  Date of Birth: 1940-05-08    BSA:     2.003 m  Patient Age:  27 years    BP:      167/78 mmHg  Patient Gender: M        HR:      80 bpm.  Exam Location: Outpatient   Procedure: 2D Echo, Cardiac Doppler and Color Doppler   Indications:  Murmur 785.2 / R01.1    History:    Patient has no prior history of Echocardiogram  examinations.         Risk Factors:Hypertension, Dyslipidemia and GERD.    Sonographer:  Bernadene Person RDCS  Referring Phys: [phone] [place]    1. Severe mitral regurgitation. Recommend TEE for further clarification.  2. Left ventricular ejection fraction, by estimation, is 60 to 65%. The  left ventricle has normal function. The left ventricle has no regional  wall motion abnormalities. Left ventricular diastolic parameters are  indeterminate.  3. Right ventricular systolic function is normal. The right ventricular  size is normal.  4. Left atrial size was mildly dilated.  5. The mitral valve is abnormal. Severe mitral valve regurgitation. No  evidence of mitral stenosis.  6. The aortic valve is normal in structure. Aortic valve regurgitation is  not visualized. No aortic stenosis is present.  7. The inferior vena cava is normal in size with greater than 50%  respiratory variability, suggesting right atrial pressure of 3 mmHg.   FINDINGS  Left Ventricle: Left ventricular ejection fraction, by estimation, is 60  to 65%.  The left ventricle has normal function. The left ventricle has no  regional wall motion abnormalities. The left ventricular internal cavity  size was normal in size. There is  no left ventricular hypertrophy. Left ventricular diastolic parameters  are indeterminate.   Right Ventricle: The right ventricular size is normal. No increase in  right ventricular wall thickness. Right ventricular systolic function is  normal.   Left Atrium: Left atrial size was mildly dilated.   Right Atrium: Right atrial size was normal in size.   Pericardium: There is no evidence of pericardial effusion.   Mitral Valve: The mitral valve is abnormal. There is moderate holosystolic  prolapse of the middle scallop of the posterior leaflet of the mitral  valve. There is mild thickening of the mitral valve leaflet(s). Normal  mobility of the mitral valve leaflets.  Moderate mitral annular calcification. Severe mitral valve regurgitation,  with eccentric anteriorly directed jet. No evidence of mitral valve  stenosis.   Tricuspid Valve: The tricuspid valve is normal in structure. Tricuspid  valve regurgitation is not demonstrated. No evidence of tricuspid  stenosis.   Aortic Valve: The aortic valve is normal in structure. Aortic valve  regurgitation is not visualized. No aortic stenosis is present.   Pulmonic Valve: The pulmonic valve was normal in structure. Pulmonic valve  regurgitation is not visualized. No evidence of pulmonic stenosis.   Aorta: The aortic root is normal in size  and structure.   Venous: The inferior vena cava is normal in size with greater than 50%  respiratory variability, suggesting right atrial pressure of 3 mmHg.   IAS/Shunts: No atrial level shunt detected by color flow Doppler.   Additional Comments: Severe mitral regurgitation. Recommend TEE for  further clarification.     LEFT VENTRICLE  PLAX 2D  LVIDd:     5.60 cm Diastology  LVIDs:     3.80 cm LV e'  lateral:  11.60 cm/s  LV PW:     1.00 cm LV E/e' lateral: 6.4  LV IVS:    0.90 cm LV e' medial:  8.81 cm/s  LVOT diam:   2.00 cm LV E/e' medial: 8.5  LV SV:     53  LV SV Index:  26  LVOT Area:   3.14 cm     RIGHT VENTRICLE  RV S prime:   11.60 cm/s  TAPSE (M-mode): 1.7 cm   LEFT ATRIUM      Index    RIGHT ATRIUM      Index  LA diam:   3.80 cm 1.90 cm/m RA Area:   14.10 cm  LA Vol (A2C): 41.8 ml 20.87 ml/m RA Volume:  30.20 ml 15.08 ml/m  LA Vol (A4C): 73.5 ml 36.69 ml/m  AORTIC VALVE  LVOT Vmax:  93.10 cm/s  LVOT Vmean: 63.300 cm/s  LVOT VTI:  0.168 m    AORTA  Ao Root diam: 3.50 cm  Ao Asc diam: 3.50 cm   MITRAL VALVE  MV Area (PHT): 4.21 cm  SHUNTS  MV Decel Time: 180 msec  Systemic VTI: 0.17 m  MV E velocity: 74.70 cm/s Systemic Diam: 2.00 cm  MV A velocity: 62.60 cm/s  MV E/A ratio: 1.19   Candee Furbish MD  Electronically signed by Candee Furbish MD  Signature Date/Time: 11/25/2019/5:52:10 PM        TRANSESOPHOGEAL ECHO REPORT       Patient Name:  Patrick Bell Date of Exam: 12/19/2019  Medical Rec #: [id number]    Height:    70.0 in  Accession #:  [id number]   Weight:    184.0 lb  Date of Birth: [birth year]    BSA:     2.015 m  Patient Age:  5 years    BP:      141/80 mmHg  Patient Gender: M        HR:      66 bpm.  Exam Location: Outpatient   Procedure: Transesophageal Echo, Cardiac Doppler, Color Doppler and 3D  Echo   Indications:   I34.0 Nonrheumatic mitral (valve) insufficiency    History:     Patient has prior history of Echocardiogram examinations,  most          recent 11/25/2019. Risk Factors:Hypertension,  Dyslipidemia,          Sleep Apnea and GERD.    Sonographer:   Jonelle Sidle Dance  Referring Phys: [id number] Claiborne  Diagnosing Phys: Buford Dresser MD   PROCEDURE: The  transesophogeal probe was passed without difficulty through  the esophogus of the patient. Sedation performed by different physician.  The patient was monitored while under deep sedation. Anesthestetic  sedation was provided intravenously by  Anesthesiology: 207mg  of Propofol, 20mg  of Lidocaine. Image quality was  excellent. The patient's vital signs; including heart rate, blood  pressure, and oxygen saturation; remained stable throughout the procedure.  The patient developed no complications  during the procedure.   IMPRESSIONS  1. Left ventricular ejection fraction, by estimation, is 60 to 65%. The  left ventricle has normal function. The left ventricle has no regional  wall motion abnormalities.  2. Right ventricular systolic function is normal. The right ventricular  size is normal.  3. No left atrial/left atrial appendage thrombus was detected.  4. Overall normal structure except for flail portion of posterior  leaflet. No clear jet to get PISA measurements, but eccentric jet with  Coanda effect suggests severe MR. Sampled pulmonary vein had some mild  systolic flow reversal; this lack of  prominent flow reversal may be due to eccentricity of the jet. See 3D  images. Appears that P2 prolapses and P1 has partial flail leaflet.. The  mitral valve is abnormal. Severe mitral valve regurgitation. No evidence  of mitral stenosis. There is severe  prolapse of of the mitral valve.  5. The aortic valve is tricuspid. Aortic valve regurgitation is trivial.  No aortic stenosis is present.  6. There is mild (Grade II) plaque involving the descending aorta.   Conclusion(s)/Recommendation(s): Abnormalities of the posterior mitral  valve leaflet. Appears to have prolapsed P2 segment and partial flail P1  segment leading to eccentric severe MR.   FINDINGS  Left Ventricle: Left ventricular ejection fraction, by estimation, is 60  to 65%. The left ventricle has normal function. The  left ventricle has no  regional wall motion abnormalities. The left ventricular internal cavity  size was normal in size.   Right Ventricle: The right ventricular size is normal. No increase in  right ventricular wall thickness. Right ventricular systolic function is  normal.   Left Atrium: Left atrial size was not assessed. No left atrial/left atrial  appendage thrombus was detected.   Right Atrium: Right atrial size was not assessed.   Pericardium: There is no evidence of pericardial effusion.   Mitral Valve: Overall normal structure except for flail portion of  posterior leaflet. No clear jet to get PISA measurements, but eccentric  jet with Coanda effect suggests severe MR. Sampled pulmonary vein had some  mild systolic flow reversal; this lack  of prominent flow reversal may be due to eccentricity of the jet. See 3D  images. Appears that P2 prolapses and P1 has partial flail leaflet. The  mitral valve is abnormal. There is severe prolapse of of the mitral valve.  Severe mitral valve regurgitation,  with eccentric anteriorly directed jet. No evidence of mitral valve  stenosis. There is no evidence of mitral valve vegetation.   Tricuspid Valve: The tricuspid valve is normal in structure. Tricuspid  valve regurgitation is trivial. No evidence of tricuspid stenosis. There  is no evidence of tricuspid valve vegetation.   Aortic Valve: The aortic valve is tricuspid. Aortic valve regurgitation is  trivial. No aortic stenosis is present. There is no evidence of aortic  valve vegetation.   Pulmonic Valve: The pulmonic valve was grossly normal. Pulmonic valve  regurgitation is trivial. No evidence of pulmonic stenosis. There is no  evidence of pulmonic valve vegetation.   Aorta: The aortic root is normal in size and structure and the aortic root  and ascending aorta are structurally normal, with no evidence of  dilitation. There is mild (Grade II) plaque involving the descending   aorta.   IAS/Shunts: No atrial level shunt detected by color flow Doppler.     TRICUSPID VALVE  TR Peak grad:  15.4 mmHg  TR Vmax:    196.00 cm/s   Buford Dresser MD  Electronically signed by  Buford Dresser MD  Signature Date/Time: 12/19/2019/5:56:25 PM      Impression:  Patient has mitral valve prolapse with stage D severe symptomatic primary mitral regurgitation.  He describes a gradual progression of exertional shortness of breath that tends to occur only with more strenuous physical exertion, consistent with chronic diastolic congestive heart failure, New York Heart Association functional class I-II.  I have personally reviewed the patient's recent transthoracic and transesophageal echocardiograms.  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  There is at least one ruptured primary chordae tendinae with a flail segment causing severe mitral regurgitation.  The jet of regurgitation is eccentric and directed around the anterior portion of the left atrium.  Left ventricular size and function appears normal.  No other significant abnormalities are noted.  I agree the patient would likely benefit from elective mitral valve repair.  Based upon review of the patient's TEE I feel there is greater than 98% likelihood of successful and durable valve repair with anticipated risk of surgery less than 1%.  The patient will likely be a good candidate for minimally invasive approach for surgery.   Plan:  The patient and his wife were counseled at length regarding the indications, risks and potential benefits of mitral valve repair.  The rationale for elective surgery has been explained, including a comparison between surgery and continued medical therapy with close follow-up.  The likelihood of successful and durable mitral valve repair has been  discussed with particular reference to the findings of their recent echocardiogram.  Based upon these findings and previous experience, I have quoted them a greater than 98 percent likelihood of successful valve repair with less than 1 percent risk of mortality or major morbidity.  Alternative surgical approaches have been discussed including a comparison between conventional sternotomy and minimally-invasive techniques.  The relative risks and benefits of each have been reviewed as they pertain to the patient's specific circumstances, and expectations for the patient's postoperative convalescence has been discussed.  The patient desires to proceed with further diagnostic work-up and surgery in the near future.  As the next step the patient will undergo left and right heart catheterization.  We will contact Dr. Evette Georges office to schedule this procedure as soon as practical.  The patient will also undergo CT angiography to evaluate the feasibility of peripheral cannulation for surgery.  The patient will return to our office for follow-up in 2 to 3 weeks once these procedures have been completed.  All questions answered.     I spent in excess of 90 minutes during the conduct of this office consultation and >50% of this time involved direct face-to-face encounter with the patient for counseling and/or coordination of their care.     Valentina Gu. Roxy Manns, MD 01/09/2020 1:59 PM

## 2020-01-09 NOTE — Patient Instructions (Signed)
Continue all previous medications without any changes at this time  

## 2020-01-15 ENCOUNTER — Other Ambulatory Visit: Payer: Self-pay | Admitting: *Deleted

## 2020-01-15 ENCOUNTER — Telehealth: Payer: Self-pay | Admitting: Cardiovascular Disease

## 2020-01-15 DIAGNOSIS — I340 Nonrheumatic mitral (valve) insufficiency: Secondary | ICD-10-CM

## 2020-01-15 NOTE — Telephone Encounter (Signed)
° °  Pt said Dr. Roxy Manns send a request to Dr. Claiborne Billings to schedule him for Heart Cath. He said he's been waiting for a week and wanted to follow up

## 2020-01-15 NOTE — Telephone Encounter (Signed)
LMTCB

## 2020-01-16 ENCOUNTER — Encounter: Payer: Self-pay | Admitting: *Deleted

## 2020-01-16 ENCOUNTER — Telehealth: Payer: Self-pay

## 2020-01-16 NOTE — Telephone Encounter (Signed)
Discussed all pre-procedure testing instructions ahead of right and left heart catheterization on 02/21/20. All questions answered. Pt verbalizes understanding.

## 2020-01-16 NOTE — Telephone Encounter (Signed)
Spoke with pt to schedule his right and left heart catheterization. Pt requests call back later this afternoon to go over pre-procedure instructions more thoroughly. Additionally, instruction letter will be mailed to pt.

## 2020-01-16 NOTE — Telephone Encounter (Signed)
Patient is returning Ann's call. Please advise.

## 2020-01-20 DIAGNOSIS — M9903 Segmental and somatic dysfunction of lumbar region: Secondary | ICD-10-CM | POA: Diagnosis not present

## 2020-01-20 DIAGNOSIS — M5137 Other intervertebral disc degeneration, lumbosacral region: Secondary | ICD-10-CM | POA: Diagnosis not present

## 2020-01-20 DIAGNOSIS — M9904 Segmental and somatic dysfunction of sacral region: Secondary | ICD-10-CM | POA: Diagnosis not present

## 2020-01-20 DIAGNOSIS — M9905 Segmental and somatic dysfunction of pelvic region: Secondary | ICD-10-CM | POA: Diagnosis not present

## 2020-01-24 ENCOUNTER — Ambulatory Visit: Payer: Medicare Other | Admitting: Physician Assistant

## 2020-01-29 ENCOUNTER — Other Ambulatory Visit: Payer: Self-pay

## 2020-01-29 ENCOUNTER — Ambulatory Visit
Admission: RE | Admit: 2020-01-29 | Discharge: 2020-01-29 | Disposition: A | Discharge status: Home / Self Care | Payer: Medicare Other | Source: Ambulatory Visit | Attending: Thoracic Surgery (Cardiothoracic Vascular Surgery) | Admitting: Thoracic Surgery (Cardiothoracic Vascular Surgery)

## 2020-01-29 ENCOUNTER — Ambulatory Visit: Payer: Medicare Other | Admitting: Physician Assistant

## 2020-01-29 DIAGNOSIS — I340 Nonrheumatic mitral (valve) insufficiency: Secondary | ICD-10-CM

## 2020-01-29 DIAGNOSIS — K5730 Diverticulosis of large intestine without perforation or abscess without bleeding: Secondary | ICD-10-CM | POA: Diagnosis not present

## 2020-01-29 DIAGNOSIS — I700 Atherosclerosis of aorta: Secondary | ICD-10-CM | POA: Diagnosis not present

## 2020-01-29 DIAGNOSIS — Z01818 Encounter for other preprocedural examination: Secondary | ICD-10-CM | POA: Diagnosis not present

## 2020-01-29 MED ORDER — IOPAMIDOL (ISOVUE-300) INJECTION 61%
75.0000 mL | Freq: Once | INTRAVENOUS | Status: AC | PRN
  Administered 2020-01-29: 100 mL via INTRAVENOUS

## 2020-01-30 ENCOUNTER — Other Ambulatory Visit: Payer: Self-pay

## 2020-01-30 ENCOUNTER — Ambulatory Visit (HOSPITAL_BASED_OUTPATIENT_CLINIC_OR_DEPARTMENT_OTHER): Payer: Medicare Other | Attending: Cardiovascular Disease | Admitting: Cardiovascular Disease

## 2020-01-30 DIAGNOSIS — Z01812 Encounter for preprocedural laboratory examination: Secondary | ICD-10-CM | POA: Insufficient documentation

## 2020-01-30 DIAGNOSIS — R0902 Hypoxemia: Secondary | ICD-10-CM | POA: Insufficient documentation

## 2020-01-30 DIAGNOSIS — R0683 Snoring: Secondary | ICD-10-CM

## 2020-01-30 DIAGNOSIS — I493 Ventricular premature depolarization: Secondary | ICD-10-CM | POA: Insufficient documentation

## 2020-01-30 DIAGNOSIS — G4733 Obstructive sleep apnea (adult) (pediatric): Secondary | ICD-10-CM | POA: Diagnosis not present

## 2020-02-03 ENCOUNTER — Ambulatory Visit: Payer: Medicare Other | Admitting: Thoracic Surgery (Cardiothoracic Vascular Surgery)

## 2020-02-04 ENCOUNTER — Encounter (HOSPITAL_BASED_OUTPATIENT_CLINIC_OR_DEPARTMENT_OTHER): Payer: Self-pay | Admitting: Cardiovascular Disease

## 2020-02-04 NOTE — Procedures (Signed)
    Patient Name: Patrick Bell, Patrick Bell Date: 01/30/2020 Gender: Male D.O.B: 1940/11/02 Age (years): 43 Referring Provider: Shelva Majestic MD, ABSM Height (inches): 70 Interpreting Physician: Shelva Majestic MD, ABSM Weight (lbs): 186 RPSGT: Baxter Flattery BMI: 27 MRN: [id number] Neck Size: 16.00  CLINICAL INFORMATION Sleep Study Type: NPSG  Indication for sleep study: Fatigue, Snoring, Witnesses Apnea / Gasping During Slee  Epworth Sleepiness Score: 12  SLEEP STUDY TECHNIQUE As per the AASM Manual for the Scoring of Sleep and Associated Events v2.3 (April 2016) with a hypopnea requiring 4% desaturations.  The channels recorded and monitored were frontal, central and occipital EEG, electrooculogram (EOG), submentalis EMG (chin), nasal and oral airflow, thoracic and abdominal wall motion, anterior tibialis EMG, snore microphone, electrocardiogram, and pulse oximetry.  MEDICATIONS fexofenadine-pseudoephedrine (ALLEGRA-D) 60-120 MG 12 hr tablet losartan (COZAAR) 50 MG tablet montelukast (SINGULAIR) 10 MG tablet Olopatadine HCl (PATADAY) 0.2 % SOLN pantoprazole (PROTONIX) 40 MG tablet PROAIR HFA 108 (90 Base) MCG/ACT inhaler simvastatin (ZOCOR) 10 MG tablet Medications self-administered by patient taken the night of the study : N/A  SLEEP ARCHITECTURE The study was initiated at 10:01:47 PM and ended at 4:22:49 AM.  Sleep onset time was 13.2 minutes and the sleep efficiency was 77.2%%. The total sleep time was 294 minutes.  Stage REM latency was 84.0 minutes.  The patient spent 9.7%% of the night in stage N1 sleep, 78.1%% in stage N2 sleep, 0.0%% in stage N3 and 12.2% in REM.  Alpha intrusion was absent.  Supine sleep was 41.83%.  RESPIRATORY PARAMETERS The overall apnea/hypopnea index (AHI) was 23.1 per hour. There were 100 total apneas, including 95 obstructive, 2 central and 3 mixed apneas. There were 13 hypopneas and 0 RERAs.  The AHI during Stage REM sleep was 6.7 per  hour.  AHI while supine was 52.2 per hour.  The mean oxygen saturation was 92.8%. The minimum SpO2 during sleep was 83.0%.  Snoring was noted during this study.  CARDIAC DATA The 2 lead EKG demonstrated sinus rhythm. The mean heart rate was 68.2 beats per minute. Other EKG findings include: PVCs.  LEG MOVEMENT DATA The total PLMS were 0 with a resulting PLMS index of 0.0. Associated arousal with leg movement index was 0.0 .  IMPRESSIONS - Moderate obstructive sleep apnea occurred during this study (AHI 23.1/h); events were severe with supine sleep (AHI 52.2/h). - No significant central sleep apnea occurred during this study (CAI = 0.4/h). - Moderate oxygen desaturation to a nadir of 83.0%. - Snoring was audible during this study. - EKG findings include PVCs. - Clinically significant periodic limb movements did not occur during sleep. No significant associated arousals.  DIAGNOSIS - Obstructive Sleep Apnea (G47.33) - Nocturnal Hypoxemia (G47.36)  RECOMMENDATIONS - Therapeutic CPAP titration to determine optimal pressure required to alleviate sleep disordered breathing. - Effort should be made to optimize nasal and oropharyngeal patency. - The patient should be counseled to avoid supine sleep; consider positional therapy avoiding supine position during sleep. - Avoid alcohol, sedatives and other CNS depressants that may worsen sleep apnea and disrupt normal sleep architecture. - Sleep hygiene should be reviewed to assess factors that may improve sleep quality. - Weight management and regular exercise should be initiated or continued if appropriate.  [Electronically signed] 02/04/2020 06:34 PM  Shelva Majestic MD, [facility], ABSM Diplomate, American Board of Sleep Medicine   NPI: [id number] [place] PH: [phone]   FX: [phone] [place]

## 2020-02-05 ENCOUNTER — Telehealth: Payer: Self-pay | Admitting: *Deleted

## 2020-02-05 DIAGNOSIS — G4733 Obstructive sleep apnea (adult) (pediatric): Secondary | ICD-10-CM

## 2020-02-05 NOTE — Telephone Encounter (Signed)
Informed patient of sleep study results and patient understanding was verbalized. Patient understands her sleep study showed IMPRESSIONS - Moderate obstructive sleep apnea occurred during this study (AHI 23.1/h); events were severe with supine sleep (AHI 52.2/h). - Moderate oxygen desaturation to a nadir of 83.0%. - Snoring was audible during this study. - EKG findings include PVCs.  DIAGNOSIS - Obstructive Sleep Apnea (G47.33) - Nocturnal Hypoxemia (G47.36)  RECOMMENDATIONS - Therapeutic CPAP titration to determine optimal pressure required to alleviate sleep disordered breathing  Titration sent to sleep pool

## 2020-02-05 NOTE — Telephone Encounter (Signed)
-----   Message from Troy Sine, MD sent at 02/04/2020  6:42 PM EDT ----- Gae Bon, please notify pt of results and schedule for CPAP titration study.

## 2020-02-06 ENCOUNTER — Other Ambulatory Visit: Payer: Medicare Other

## 2020-02-06 ENCOUNTER — Other Ambulatory Visit: Payer: Self-pay

## 2020-02-06 DIAGNOSIS — I340 Nonrheumatic mitral (valve) insufficiency: Secondary | ICD-10-CM | POA: Diagnosis not present

## 2020-02-07 LAB — BASIC METABOLIC PANEL
BUN/Creatinine Ratio: 15 (ref 10–24)
BUN: 13 mg/dL (ref 8–27)
CO2: 24 mmol/L (ref 20–29)
Calcium: 9.5 mg/dL (ref 8.6–10.2)
Chloride: 103 mmol/L (ref 96–106)
Creatinine, Ser: 0.85 mg/dL (ref 0.76–1.27)
GFR calc Af Amer: 95 mL/min/{1.73_m2} (ref >59)
GFR calc non Af Amer: 82 mL/min/{1.73_m2} (ref >59)
Glucose: 78 mg/dL (ref 65–99)
Potassium: 4.4 mmol/L (ref 3.5–5.2)
Sodium: 141 mmol/L (ref 134–144)

## 2020-02-07 LAB — CBC
Hematocrit: 45.1 % (ref 37.5–51.0)
Hemoglobin: 15.2 g/dL (ref 13.0–17.7)
MCH: 29.4 pg (ref 26.6–33.0)
MCHC: 33.7 g/dL (ref 31.5–35.7)
MCV: 87 fL (ref 79–97)
Platelets: 190 10*3/uL (ref 150–450)
RBC: 5.17 x10E6/uL (ref 4.14–5.80)
RDW: 12.8 % (ref 11.6–15.4)
WBC: 5.1 10*3/uL (ref 3.4–10.8)

## 2020-02-10 ENCOUNTER — Telehealth: Payer: Self-pay | Admitting: Cardiovascular Disease

## 2020-02-10 NOTE — Telephone Encounter (Signed)
Returned call to patient to offer assistance he wanted to know his next appointment with dr Claiborne Billings.  Pt is aware and agreeable to treatment.

## 2020-02-10 NOTE — Telephone Encounter (Signed)
New message:      Patient calling stating some one called him. I did not see a note

## 2020-02-12 ENCOUNTER — Telehealth: Payer: Self-pay | Admitting: Cardiovascular Disease

## 2020-02-12 NOTE — Telephone Encounter (Signed)
No PA required for Cpap titration.  Waiting on sleep lab to call back to schedule

## 2020-02-13 DIAGNOSIS — M9903 Segmental and somatic dysfunction of lumbar region: Secondary | ICD-10-CM | POA: Diagnosis not present

## 2020-02-13 DIAGNOSIS — M9904 Segmental and somatic dysfunction of sacral region: Secondary | ICD-10-CM | POA: Diagnosis not present

## 2020-02-13 DIAGNOSIS — M9905 Segmental and somatic dysfunction of pelvic region: Secondary | ICD-10-CM | POA: Diagnosis not present

## 2020-02-13 DIAGNOSIS — M5137 Other intervertebral disc degeneration, lumbosacral region: Secondary | ICD-10-CM | POA: Diagnosis not present

## 2020-02-18 ENCOUNTER — Other Ambulatory Visit (HOSPITAL_COMMUNITY)
Admission: RE | Admit: 2020-02-18 | Discharge: 2020-02-18 | Disposition: A | Discharge status: Home / Self Care | Payer: Medicare Other | Source: Ambulatory Visit | Attending: Cardiovascular Disease | Admitting: Cardiovascular Disease

## 2020-02-18 DIAGNOSIS — Z01812 Encounter for preprocedural laboratory examination: Secondary | ICD-10-CM | POA: Insufficient documentation

## 2020-02-18 DIAGNOSIS — Z20822 Contact with and (suspected) exposure to covid-19: Secondary | ICD-10-CM | POA: Diagnosis not present

## 2020-02-18 LAB — SARS CORONAVIRUS 2 (TAT 6-24 HRS): SARS Coronavirus 2: NEGATIVE

## 2020-02-20 ENCOUNTER — Encounter: Payer: Self-pay | Admitting: Cardiovascular Disease

## 2020-02-20 ENCOUNTER — Telehealth (INDEPENDENT_AMBULATORY_CARE_PROVIDER_SITE_OTHER): Payer: Medicare Other | Admitting: Cardiovascular Disease

## 2020-02-20 VITALS — BP 159/86 | Wt 186.0 lb

## 2020-02-20 DIAGNOSIS — J4520 Mild intermittent asthma, uncomplicated: Secondary | ICD-10-CM

## 2020-02-20 DIAGNOSIS — E785 Hyperlipidemia, unspecified: Secondary | ICD-10-CM | POA: Diagnosis not present

## 2020-02-20 DIAGNOSIS — I340 Nonrheumatic mitral (valve) insufficiency: Secondary | ICD-10-CM

## 2020-02-20 DIAGNOSIS — I10 Essential (primary) hypertension: Secondary | ICD-10-CM | POA: Diagnosis not present

## 2020-02-20 DIAGNOSIS — I341 Nonrheumatic mitral (valve) prolapse: Secondary | ICD-10-CM

## 2020-02-20 DIAGNOSIS — G4733 Obstructive sleep apnea (adult) (pediatric): Secondary | ICD-10-CM

## 2020-02-20 NOTE — H&P (View-Only) (Signed)
Virtual Visit via Telephone Note   This visit type was conducted due to national recommendations for restrictions regarding the COVID-19 Pandemic (e.g. social distancing) in an effort to limit this patient's exposure and mitigate transmission in our community.  Due to his co-morbid illnesses, this patient is at least at moderate risk for complications without adequate follow up.  This format is felt to be most appropriate for this patient at this time.  The patient did not have access to video technology/had technical difficulties with video requiring transitioning to audio format only (telephone).  All issues noted in this document were discussed and addressed.  No physical exam could be performed with this format.  Please refer to the patient's chart for his  consent to telehealth for [facility].    Date:  02/20/2020   ID:  [name], DOB Feb 07, 1941, MRN [id number] The patient was identified using 2 identifiers.  Patient Location: Home Provider Location: Home Office  PCP:  Dettinger, Fransisca Kaufmann, MD  Cardiologist:  Shelva Majestic, MD  Electrophysiologist:  None   Evaluation Performed:  Follow-Up Visit  Chief Complaint: Severe mitral regurgitation / pre-cardiac catheterization evaluation  History of Present Illness:    STEVEN BASSO is a 79 y.o. male with who has a history of hypertension, hyperlipidemia, asthma as well as a history of obstructive sleep apnea diagnosed on a prior sleep study as severe.  Study interpreted by Dr. Halford Chessman in January 2016 showed an AHI of 56.9/h. He stopped using CPAP over 3 years ago.  He has a history of hypertension and remotely was on amlodipine but also has been off treatment for at least a year.  Recently began to notice increasing blood pressure typically in excess of 140.  He has noticed some mild shortness of breath with activity and also is experiences some nonexertional left-sided short-lived chest pains.  He was recently evaluated by Dr.  Warrick Parisian.  He was referred for an echo Doppler study which was done in November 25, 2019 which revealed normal LV function with an EF of 60 to 65% with indeterminant diastolic parameters.  RV systolic function was normal.  There was mild LA dilation.  He was felt to have severe mitral regurgitation with moderate holosystolic prolapse of the middle scallop of the posterior leaflet of the mitral valve.  There was moderate mitral annular calcification in the severe MR had eccentric anteriorly directed jet.  There was no evidence for mitral stenosis.    He was referred for cardiology evaluation.  At that time he denied any awareness of rhythm abnormality.  He was sleeping 6 poorly.  He was unable to sleep on his back.  An Epworth Sleepiness Scale score calculated in the office was significantly elevated at 18 consistent with significant excessive daytime sleepiness.  At my initial evaluation, I recommended that he undergo a transesophageal echocardiogram to assess his severe mitral regurgitation and possible potential flail leaflet.  I also recommended he undergo a sleep study.  The patient subsequently underwent TEE which confirmed the presence of mitral valve prolapse with flail segment involving a portion of the posterior leaflet and severe mitral regurgitation.  He was subsequently seen by Almyra Deforest, PA and was referred to Dr. Darylene Price for surgical consultation.  He saw Dr. Roxy Manns on January 09, 2020 who felt he was a good candidate for mitral valve repair.  He subsequently has undergone CT imaging studies and is referred for right and left heart cardiac catheterization prior to planned surgery  in early December.  The patient did undergo his initial sleep study.  Apparently he did not meet split-night criteria and so his initial study was a diagnostic polysomnogram.  This reconfirmed moderate overall sleep apnea with an AHI of 23.1/h; however, sleep apnea was very severe in the supine position with an AHI of  52.2/h.  Patient now presents for follow-up cardiology evaluation prior to his right left heart catheterization tomorrow.  A Covid screening test was negative.   The patient does not have symptoms concerning for COVID-19 infection (fever, chills, cough, or new shortness of breath).    Past Medical History:  Diagnosis Date  . Allergy   . Asthma   . Cataract   . Cataract   . Diverticulitis   . GERD (gastroesophageal reflux disease)   . Hyperlipidemia   . Hypertension   . Sleep apnea    Past Surgical History:  Procedure Laterality Date  . bilateral inguinal hernia    . COLONOSCOPY  06/09/2004   ELF:YBOFBPZ colon diverticulosis, otherwise normal colonoscopy  . EYE SURGERY  08/2014  . HERNIA REPAIR    . PALATE / UVULA BIOPSY / EXCISION    . TEE WITHOUT CARDIOVERSION N/A 12/19/2019   Procedure: TRANSESOPHAGEAL ECHOCARDIOGRAM (TEE);  Surgeon: Buford Dresser, MD;  Location: [facility] ENDOSCOPY;  Service: Cardiovascular;  Laterality: N/A;  . TONSILLECTOMY    . VASECTOMY       Current Meds  Medication Sig  . acetaminophen (TYLENOL) 500 MG tablet Take 500 mg by mouth daily as needed (pain.).  Marland Kitchen fexofenadine-pseudoephedrine (ALLEGRA-D) 60-120 MG 12 hr tablet Take 1 tablet by mouth as needed.  . hydroxypropyl methylcellulose / hypromellose (ISOPTO TEARS / GONIOVISC) 2.5 % ophthalmic solution Place 1-2 drops into both eyes 3 (three) times daily as needed (dry/irritated eyes.).  Marland Kitchen losartan (COZAAR) 50 MG tablet Take 1 tablet (50 mg total) by mouth daily. (Patient taking differently: Take 50 mg by mouth every evening. )  . montelukast (SINGULAIR) 10 MG tablet Take 1 tablet (10 mg total) daily by mouth. (Patient taking differently: Take 10 mg by mouth at bedtime. )  . Olopatadine HCl (PATADAY) 0.2 % SOLN Apply to eye as needed (allergies).  . pantoprazole (PROTONIX) 40 MG tablet Take 1 tablet (40 mg total) daily by mouth. (Patient taking differently: Take 40 mg by mouth at bedtime. )  .  PROAIR HFA 108 (90 Base) MCG/ACT inhaler Inhale 2 puffs into the lungs every 6 (six) hours as needed for wheezing. (Patient taking differently: Inhale 2 puffs into the lungs every 6 (six) hours as needed for wheezing. )  . simvastatin (ZOCOR) 10 MG tablet Take 1 tablet (10 mg total) by mouth at bedtime.     Allergies:   Patient has no known allergies.   Social History   Tobacco Use  . Smoking status: Former Smoker    Packs/day: 4.00    Years: 25.00    Pack years: 100.00    Types: Cigarettes    Quit date: 06/23/1982    Years since quitting: 37.6  . Smokeless tobacco: Never Used  . 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 Vaping Use: Never used  Substance Use Topics  . Alcohol use: Yes    Alcohol/week: 5.0 standard drinks    Types: 5 Shots of liquor per week    Comment: brandy a few times a month, beer once a month  . Drug use: No     Family Hx: The patient's family history  includes Arthritis in his brother, father, and sister; Dementia (age of onset: 29) in his mother; Heart disease in his father and mother; Hip fracture (age of onset: 50) in his mother; Osteoporosis in his mother. There is no history of Colon cancer.  ROS:   Please see the history of present illness.    No fevers chills night sweats. No palpitations. Gradual development of mild shortness of breath with more physical exertion. Vague nonexertional atypical chest pains No claudication symptoms Poor sleep with untreated OSA. All other systems reviewed and are negative.   Prior CV studies:   The following studies were reviewed today:  TEE 12/19/2019 IMPRESSIONS  1. Left ventricular ejection fraction, by estimation, is 60 to 65%. The  left ventricle has normal function. The left ventricle has no regional  wall motion abnormalities.  2. Right ventricular systolic function is normal. The right ventricular  size is normal.  3. No left atrial/left atrial appendage thrombus was detected.  4.  Overall normal structure except for flail portion of posterior  leaflet. No clear jet to get PISA measurements, but eccentric jet with  Coanda effect suggests severe MR. Sampled pulmonary vein had some mild  systolic flow reversal; this lack of  prominent flow reversal may be due to eccentricity of the jet. See 3D  images. Appears that P2 prolapses and P1 has partial flail leaflet.. The  mitral valve is abnormal. Severe mitral valve regurgitation. No evidence  of mitral stenosis. There is severe  prolapse of of the mitral valve.  5. The aortic valve is tricuspid. Aortic valve regurgitation is trivial.  No aortic stenosis is present.  6. There is mild (Grade II) plaque involving the descending aorta.   Conclusion(s)/Recommendation(s): Abnormalities of the posterior mitral  valve leaflet. Appears to have prolapsed P2 segment and partial flail P1  segment leading to eccentric severe MR.   Labs/Other Tests and Data Reviewed:    EKG:  An ECG dated 12/12/2019 was personally reviewed today and demonstrated:  Sinus rhythm at 79 bpm, PVC and PAC, first-degree block with a PR interval 208 ms  Recent Labs: 11/14/2019: ALT 10 02/06/2020: BUN 13; Creatinine, Ser 0.85; Hemoglobin 15.2; Platelets 190; Potassium 4.4; Sodium 141   Recent Lipid Panel Lab Results  Component Value Date/Time   CHOL 184 11/14/2019 10:04 AM   CHOL 168 06/18/2012 10:31 AM   TRIG 87 11/14/2019 10:04 AM   TRIG 101 01/14/2013 12:18 PM   TRIG 102 06/18/2012 10:31 AM   HDL 70 11/14/2019 10:04 AM   HDL 65 01/14/2013 12:18 PM   HDL 62 06/18/2012 10:31 AM   CHOLHDL 2.6 11/14/2019 10:04 AM   LDLCALC 98 11/14/2019 10:04 AM   LDLCALC 99 01/14/2013 12:18 PM   LDLCALC 86 06/18/2012 10:31 AM    Wt Readings from Last 3 Encounters:  02/20/20 186 lb (84.4 kg)  01/30/20 186 lb (84.4 kg)  01/09/20 183 lb 6.4 oz (83.2 kg)      Objective:    Vital Signs:  BP (!) 159/86   Wt 186 lb (84.4 kg)   BMI 26.69 kg/m    Since this  was a virtual evaluation I could not physically examine the patient today. Breathing was normal and not labored There was no audible wheezing or shortness of breath He did not have any palpitations there was no chest discomfort. Abdomen was nontender He was not aware of any edema He had a normal affect and mood  ASSESSMENT & PLAN:    1. Severe  mitral regurgitation   2. Essential hypertension   3. Mitral valve prolapse   4. OSA (obstructive sleep apnea)   5. Hyperlipidemia LDL goal <100   6. Mild intermittent asthma without complication    Mr. Jayon Matton is an 79 year old gentleman who has a history of hypertension, hyperlipidemia, mild asthma, as well as a prior history of severe sleep apnea not treated over the last 3 years.  He was unaware of any significant cardiac issues but had noticed some mild shortness of breath with increasing activity.  He has been found to have severe mitral regurgitation with a partially flail posterior leaflet on TEE with significant eccentric MR.He has been evaluated by Dr. 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  He is scheduled to undergo right and left heart cardiac catheterization with me tomorrow.  I had a lengthy discussion with him regarding this procedure in detail.  I also discussed the risks and benefits of a cardiac catheterization including, but not limited to, death, stroke, MI, kidney damage and bleeding were discussed with the patient who indicates understanding and agrees to proceed.  We discussed the brachial vein approach for right heart cath and most likely radial approach for his arterial access.  His blood pressure today is slightly elevated.  He has been on losartan 50 mg for hypertension.  He  also has been on low-dose simvastatin for hyperlipidemia.  I reviewed his sleep study in detail which confirmed moderate overall sleep apnea but very severe sleep apnea in the supine position which explains why he cannot sleep on his back.  Apparently he did not meet split-night criteria which is why CPAP titration was not done on the same night.  With his upcoming valve surgery, and is tentatively scheduled CPAP titration study to be done in the perioperative period I have recommended that this be deferred until early next year once he is stable cardiovascularly.  The patient's recent laboratory was reviewed.  Covid status is negative for his catheterization test tomorrow.  COVID-19 Education: The signs and symptoms of COVID-19 were discussed with the patient and how to seek care for testing (follow up with PCP or arrange E-visit). The importance of social distancing was discussed today.  Time:   Today, I have spent 25 minutes with the patient with telehealth technology discussing the above problems.     Medication Adjustments/Labs and Tests Ordered: Current medicines are reviewed at length with the patient today.  Concerns regarding medicines are outlined above.   Tests Ordered: No orders of the defined types were placed in this encounter.   Medication Changes: No orders of the defined types were placed in this encounter.   Follow Up:  3 months  Signed, Shelva Majestic, MD  02/20/2020 11:05 AM    [place]

## 2020-02-20 NOTE — Progress Notes (Addendum)
Virtual Visit via Telephone Note   This visit type was conducted due to national recommendations for restrictions regarding the COVID-19 Pandemic (e.g. social distancing) in an effort to limit this patient's exposure and mitigate transmission in our community.  Due to his co-morbid illnesses, this patient is at least at moderate risk for complications without adequate follow up.  This format is felt to be most appropriate for this patient at this time.  The patient did not have access to video technology/had technical difficulties with video requiring transitioning to audio format only (telephone).  All issues noted in this document were discussed and addressed.  No physical exam could be performed with this format.  Please refer to the patient's chart for his  consent to telehealth for [facility].    Date:  02/20/2020   ID:  Patrick Bell, DOB [birth year], MRN [id number] The patient was identified using 2 identifiers.  Patient Location: Home Provider Location: Home Office  PCP:  Dettinger, Fransisca Kaufmann, MD  Cardiologist:  Shelva Majestic, MD  Electrophysiologist:  None   Evaluation Performed:  Follow-Up Visit  Chief Complaint: Severe mitral regurgitation / pre-cardiac catheterization evaluation  History of Present Illness:    Patrick Bell is a 79 y.o. male with who has a history of hypertension, hyperlipidemia, asthma as well as a history of obstructive sleep apnea diagnosed on a prior sleep study as severe.  Study interpreted by Dr. Halford Chessman in January 2016 showed an AHI of 56.9/h. He stopped using CPAP over 3 years ago.  He has a history of hypertension and remotely was on amlodipine but also has been off treatment for at least a year.  Recently began to notice increasing blood pressure typically in excess of 140.  He has noticed some mild shortness of breath with activity and also is experiences some nonexertional left-sided short-lived chest pains.  He was recently evaluated by Dr.  Warrick Parisian.  He was referred for an echo Doppler study which was done in November 25, 2019 which revealed normal LV function with an EF of 60 to 65% with indeterminant diastolic parameters.  RV systolic function was normal.  There was mild LA dilation.  He was felt to have severe mitral regurgitation with moderate holosystolic prolapse of the middle scallop of the posterior leaflet of the mitral valve.  There was moderate mitral annular calcification in the severe MR had eccentric anteriorly directed jet.  There was no evidence for mitral stenosis.    He was referred for cardiology evaluation.  At that time he denied any awareness of rhythm abnormality.  He was sleeping 6 poorly.  He was unable to sleep on his back.  An Epworth Sleepiness Scale score calculated in the office was significantly elevated at 18 consistent with significant excessive daytime sleepiness.  At my initial evaluation, I recommended that he undergo a transesophageal echocardiogram to assess his severe mitral regurgitation and possible potential flail leaflet.  I also recommended he undergo a sleep study.  The patient subsequently underwent TEE which confirmed the presence of mitral valve prolapse with flail segment involving a portion of the posterior leaflet and severe mitral regurgitation.  He was subsequently seen by Almyra Deforest, PA and was referred to Dr. Darylene Price for surgical consultation.  He saw Dr. Roxy Manns on January 09, 2020 who felt he was a good candidate for mitral valve repair.  He subsequently has undergone CT imaging studies and is referred for right and left heart cardiac catheterization prior to planned surgery  in early December.  The patient did undergo his initial sleep study.  Apparently he did not meet split-night criteria and so his initial study was a diagnostic polysomnogram.  This reconfirmed moderate overall sleep apnea with an AHI of 23.1/h; however, sleep apnea was very severe in the supine position with an AHI of  52.2/h.  Patient now presents for follow-up cardiology evaluation prior to his right left heart catheterization tomorrow.  A Covid screening test was negative.   The patient does not have symptoms concerning for COVID-19 infection (fever, chills, cough, or new shortness of breath).    Past Medical History:  Diagnosis Date  . Allergy   . Asthma   . Cataract   . Cataract   . Diverticulitis   . GERD (gastroesophageal reflux disease)   . Hyperlipidemia   . Hypertension   . Sleep apnea    Past Surgical History:  Procedure Laterality Date  . bilateral inguinal hernia    . COLONOSCOPY  06/09/2004   [id number] colon diverticulosis, otherwise normal colonoscopy  . EYE SURGERY  08/2014  . HERNIA REPAIR    . PALATE / UVULA BIOPSY / EXCISION    . TEE WITHOUT CARDIOVERSION N/A 12/19/2019   Procedure: TRANSESOPHAGEAL ECHOCARDIOGRAM (TEE);  Surgeon: Buford Dresser, MD;  Location: [facility] ENDOSCOPY;  Service: Cardiovascular;  Laterality: N/A;  . TONSILLECTOMY    . VASECTOMY       Current Meds  Medication Sig  . acetaminophen (TYLENOL) 500 MG tablet Take 500 mg by mouth daily as needed (pain.).  Marland Kitchen fexofenadine-pseudoephedrine (ALLEGRA-D) 60-120 MG 12 hr tablet Take 1 tablet by mouth as needed.  . hydroxypropyl methylcellulose / hypromellose (ISOPTO TEARS / GONIOVISC) 2.5 % ophthalmic solution Place 1-2 drops into both eyes 3 (three) times daily as needed (dry/irritated eyes.).  Marland Kitchen losartan (COZAAR) 50 MG tablet Take 1 tablet (50 mg total) by mouth daily. (Patient taking differently: Take 50 mg by mouth every evening. )  . montelukast (SINGULAIR) 10 MG tablet Take 1 tablet (10 mg total) daily by mouth. (Patient taking differently: Take 10 mg by mouth at bedtime. )  . Olopatadine HCl (PATADAY) 0.2 % SOLN Apply to eye as needed (allergies).  . pantoprazole (PROTONIX) 40 MG tablet Take 1 tablet (40 mg total) daily by mouth. (Patient taking differently: Take 40 mg by mouth at bedtime. )  .  PROAIR HFA 108 (90 Base) MCG/ACT inhaler Inhale 2 puffs into the lungs every 6 (six) hours as needed for wheezing. (Patient taking differently: Inhale 2 puffs into the lungs every 6 (six) hours as needed for wheezing. )  . simvastatin (ZOCOR) 10 MG tablet Take 1 tablet (10 mg total) by mouth at bedtime.     Allergies:   Patient has no known allergies.   Social History   Tobacco Use  . Smoking status: Former Smoker    Packs/day: 4.00    Years: 25.00    Pack years: 100.00    Types: Cigarettes    Quit date: 06/23/1982    Years since quitting: 37.6  . Smokeless tobacco: Never Used  . 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 Vaping Use: Never used  Substance Use Topics  . Alcohol use: Yes    Alcohol/week: 5.0 standard drinks    Types: 5 Shots of liquor per week    Comment: brandy a few times a month, beer once a month  . Drug use: No     Family Hx: The patient's family history  includes Arthritis in his brother, father, and sister; Dementia (age of onset: 11) in his mother; Heart disease in his father and mother; Hip fracture (age of onset: 14) in his mother; Osteoporosis in his mother. There is no history of Colon cancer.  ROS:   Please see the history of present illness.    No fevers chills night sweats. No palpitations. Gradual development of mild shortness of breath with more physical exertion. Vague nonexertional atypical chest pains No claudication symptoms Poor sleep with untreated OSA. All other systems reviewed and are negative.   Prior CV studies:   The following studies were reviewed today:  TEE 12/19/2019 IMPRESSIONS  1. Left ventricular ejection fraction, by estimation, is 60 to 65%. The  left ventricle has normal function. The left ventricle has no regional  wall motion abnormalities.  2. Right ventricular systolic function is normal. The right ventricular  size is normal.  3. No left atrial/left atrial appendage thrombus was detected.  4.  Overall normal structure except for flail portion of posterior  leaflet. No clear jet to get PISA measurements, but eccentric jet with  Coanda effect suggests severe MR. Sampled pulmonary vein had some mild  systolic flow reversal; this lack of  prominent flow reversal may be due to eccentricity of the jet. See 3D  images. Appears that P2 prolapses and P1 has partial flail leaflet.. The  mitral valve is abnormal. Severe mitral valve regurgitation. No evidence  of mitral stenosis. There is severe  prolapse of of the mitral valve.  5. The aortic valve is tricuspid. Aortic valve regurgitation is trivial.  No aortic stenosis is present.  6. There is mild (Grade II) plaque involving the descending aorta.   Conclusion(s)/Recommendation(s): Abnormalities of the posterior mitral  valve leaflet. Appears to have prolapsed P2 segment and partial flail P1  segment leading to eccentric severe MR.   Labs/Other Tests and Data Reviewed:    EKG:  An ECG dated 12/12/2019 was personally reviewed today and demonstrated:  Sinus rhythm at 79 bpm, PVC and PAC, first-degree block with a PR interval 208 ms  Recent Labs: 11/14/2019: ALT 10 02/06/2020: BUN 13; Creatinine, Ser 0.85; Hemoglobin 15.2; Platelets 190; Potassium 4.4; Sodium 141   Recent Lipid Panel Lab Results  Component Value Date/Time   CHOL 184 11/14/2019 10:04 AM   CHOL 168 06/18/2012 10:31 AM   TRIG 87 11/14/2019 10:04 AM   TRIG 101 01/14/2013 12:18 PM   TRIG 102 06/18/2012 10:31 AM   HDL 70 11/14/2019 10:04 AM   HDL 65 01/14/2013 12:18 PM   HDL 62 06/18/2012 10:31 AM   CHOLHDL 2.6 11/14/2019 10:04 AM   LDLCALC 98 11/14/2019 10:04 AM   LDLCALC 99 01/14/2013 12:18 PM   LDLCALC 86 06/18/2012 10:31 AM    Wt Readings from Last 3 Encounters:  02/20/20 186 lb (84.4 kg)  01/30/20 186 lb (84.4 kg)  01/09/20 183 lb 6.4 oz (83.2 kg)      Objective:    Vital Signs:  BP (!) 159/86   Wt 186 lb (84.4 kg)   BMI 26.69 kg/m    Since this  was a virtual evaluation I could not physically examine the patient today. Breathing was normal and not labored There was no audible wheezing or shortness of breath He did not have any palpitations there was no chest discomfort. Abdomen was nontender He was not aware of any edema He had a normal affect and mood  ASSESSMENT & PLAN:    1. Severe  mitral regurgitation   2. Essential hypertension   3. Mitral valve prolapse   4. OSA (obstructive sleep apnea)   5. Hyperlipidemia LDL goal <100   6. Mild intermittent asthma without complication    Mr. Cortlan Dolin is an 79 year old gentleman who has a history of hypertension, hyperlipidemia, mild asthma, as well as a prior history of severe sleep apnea not treated over the last 3 years.  He was unaware of any significant cardiac issues but had noticed some mild shortness of breath with increasing activity.  He has been found to have severe mitral regurgitation with a partially flail posterior leaflet on TEE with significant eccentric MR.He has been evaluated by Dr. 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  He is scheduled to undergo right and left heart cardiac catheterization with me tomorrow.  I had a lengthy discussion with him regarding this procedure in detail.  I also discussed the risks and benefits of a cardiac catheterization including, but not limited to, death, stroke, MI, kidney damage and bleeding were discussed with the patient who indicates understanding and agrees to proceed.  We discussed the brachial vein approach for right heart cath and most likely radial approach for his arterial access.  His blood pressure today is slightly elevated.  He has been on losartan 50 mg for hypertension.  He  also has been on low-dose simvastatin for hyperlipidemia.  I reviewed his sleep study in detail which confirmed moderate overall sleep apnea but very severe sleep apnea in the supine position which explains why he cannot sleep on his back.  Apparently he did not meet split-night criteria which is why CPAP titration was not done on the same night.  With his upcoming valve surgery, and is tentatively scheduled CPAP titration study to be done in the perioperative period I have recommended that this be deferred until early next year once he is stable cardiovascularly.  The patient's recent laboratory was reviewed.  Covid status is negative for his catheterization test tomorrow.  COVID-19 Education: The signs and symptoms of COVID-19 were discussed with the patient and how to seek care for testing (follow up with PCP or arrange E-visit). The importance of social distancing was discussed today.  Time:   Today, I have spent 25 minutes with the patient with telehealth technology discussing the above problems.     Medication Adjustments/Labs and Tests Ordered: Current medicines are reviewed at length with the patient today.  Concerns regarding medicines are outlined above.   Tests Ordered: No orders of the defined types were placed in this encounter.   Medication Changes: No orders of the defined types were placed in this encounter.   Follow Up:  3 months  Signed, Shelva Majestic, MD  02/20/2020 11:05 AM    [place]

## 2020-02-20 NOTE — Patient Instructions (Addendum)
Medication Instructions:  Your physician recommends that you continue on your current medications as directed. Please refer to the Current Medication list given to you today.  *If you need a refill on your cardiac medications before your next appointment, please call your pharmacy*  Lab Work: NONE   Testing/Procedures: CATH AS SCHEDULED TOMORROW   Follow-Up: At [facility], you and your health needs are our priority.  As part of our continuing mission to provide you with exceptional heart care, we have created designated Provider Care Teams.  These Care Teams include your primary Cardiologist (physician) and Advanced Practice Providers (APPs -  Physician Assistants and Nurse Practitioners) who all work together to provide you with the care you need, when you need it.  We recommend signing up for the patient portal called "MyChart".  Sign up information is provided on this After Visit Summary.  MyChart is used to connect with patients for Virtual Visits (Telemedicine).  Patients are able to view lab/test results, encounter notes, upcoming appointments, etc.  Non-urgent messages can be sent to your provider as well.   To learn more about what you can do with MyChart, go to [email or web address].    Your next appointment:    KEEP 05/11/2020 VISIT   Other Instructions  CALL [phone] TO GET YOUR CPAP TITRATION RESCHEDULED UNTIL AFTER PROCEDURE

## 2020-02-21 ENCOUNTER — Ambulatory Visit (HOSPITAL_COMMUNITY)
Admission: RE | Admit: 2020-02-21 | Discharge: 2020-02-21 | Disposition: A | Discharge status: Home / Self Care | Payer: Medicare Other | Attending: Cardiovascular Disease | Admitting: Cardiovascular Disease

## 2020-02-21 ENCOUNTER — Other Ambulatory Visit: Payer: Self-pay

## 2020-02-21 ENCOUNTER — Ambulatory Visit (HOSPITAL_COMMUNITY)
Admission: RE | Disposition: A | Discharge status: Home / Self Care | Payer: Medicare Other | Source: Home / Self Care | Attending: Cardiovascular Disease

## 2020-02-21 DIAGNOSIS — I340 Nonrheumatic mitral (valve) insufficiency: Secondary | ICD-10-CM | POA: Diagnosis not present

## 2020-02-21 DIAGNOSIS — Z79899 Other long term (current) drug therapy: Secondary | ICD-10-CM | POA: Insufficient documentation

## 2020-02-21 DIAGNOSIS — Z87891 Personal history of nicotine dependence: Secondary | ICD-10-CM | POA: Insufficient documentation

## 2020-02-21 DIAGNOSIS — Z8249 Family history of ischemic heart disease and other diseases of the circulatory system: Secondary | ICD-10-CM | POA: Insufficient documentation

## 2020-02-21 DIAGNOSIS — J4520 Mild intermittent asthma, uncomplicated: Secondary | ICD-10-CM | POA: Insufficient documentation

## 2020-02-21 DIAGNOSIS — E785 Hyperlipidemia, unspecified: Secondary | ICD-10-CM | POA: Diagnosis not present

## 2020-02-21 DIAGNOSIS — G4733 Obstructive sleep apnea (adult) (pediatric): Secondary | ICD-10-CM | POA: Insufficient documentation

## 2020-02-21 DIAGNOSIS — I080 Rheumatic disorders of both mitral and aortic valves: Secondary | ICD-10-CM | POA: Insufficient documentation

## 2020-02-21 DIAGNOSIS — I10 Essential (primary) hypertension: Secondary | ICD-10-CM | POA: Diagnosis not present

## 2020-02-21 HISTORY — PX: CARDIAC CATHETERIZATION: SHX172

## 2020-02-21 HISTORY — PX: RIGHT/LEFT HEART CATH AND CORONARY ANGIOGRAPHY: CATH118266

## 2020-02-21 LAB — POCT I-STAT EG7
Acid-Base Excess: 1 mmol/L (ref 0.0–2.0)
Acid-Base Excess: 1 mmol/L (ref 0.0–2.0)
Bicarbonate: 26.9 mmol/L (ref 20.0–28.0)
Bicarbonate: 27 mmol/L (ref 20.0–28.0)
Calcium, Ion: 1.24 mmol/L (ref 1.15–1.40)
Calcium, Ion: 1.24 mmol/L (ref 1.15–1.40)
HCT: 40 % (ref 39.0–52.0)
HCT: 41 % (ref 39.0–52.0)
Hemoglobin: 13.6 g/dL (ref 13.0–17.0)
Hemoglobin: 13.9 g/dL (ref 13.0–17.0)
O2 Saturation: 73 %
O2 Saturation: 74 %
Potassium: 4 mmol/L (ref 3.5–5.1)
Potassium: 4.1 mmol/L (ref 3.5–5.1)
Sodium: 141 mmol/L (ref 135–145)
Sodium: 141 mmol/L (ref 135–145)
TCO2: 28 mmol/L (ref 22–32)
TCO2: 28 mmol/L (ref 22–32)
pCO2, Ven: 47.2 mmHg (ref 44.0–60.0)
pCO2, Ven: 47.6 mmHg (ref 44.0–60.0)
pH, Ven: 7.36 (ref 7.250–7.430)
pH, Ven: 7.366 (ref 7.250–7.430)
pO2, Ven: 41 mmHg (ref 32.0–45.0)
pO2, Ven: 41 mmHg (ref 32.0–45.0)

## 2020-02-21 LAB — POCT I-STAT 7, (LYTES, BLD GAS, ICA,H+H)
Acid-Base Excess: 0 mmol/L (ref 0.0–2.0)
Bicarbonate: 27.1 mmol/L (ref 20.0–28.0)
Calcium, Ion: 1.29 mmol/L (ref 1.15–1.40)
HCT: 40 % (ref 39.0–52.0)
Hemoglobin: 13.6 g/dL (ref 13.0–17.0)
O2 Saturation: 98 %
Potassium: 4 mmol/L (ref 3.5–5.1)
Sodium: 140 mmol/L (ref 135–145)
TCO2: 29 mmol/L (ref 22–32)
pCO2 arterial: 50.4 mmHg — ABNORMAL HIGH (ref 32.0–48.0)
pH, Arterial: 7.338 — ABNORMAL LOW (ref 7.350–7.450)
pO2, Arterial: 120 mmHg — ABNORMAL HIGH (ref 83.0–108.0)

## 2020-02-21 SURGERY — RIGHT/LEFT HEART CATH AND CORONARY ANGIOGRAPHY
Anesthesia: LOCAL

## 2020-02-21 MED ORDER — ACETAMINOPHEN 325 MG PO TABS: 650.0000 mg | ORAL_TABLET | ORAL | Status: DC | PRN

## 2020-02-21 MED ORDER — HEPARIN (PORCINE) IN NACL 1000-0.9 UT/500ML-% IV SOLN
INTRAVENOUS | Status: DC | PRN
  Administered 2020-02-21: 500 mL

## 2020-02-21 MED ORDER — SODIUM CHLORIDE 0.9 % IV SOLN: 250.0000 mL | INTRAVENOUS | Status: DC | PRN

## 2020-02-21 MED ORDER — DIAZEPAM 5 MG PO TABS: 5.0000 mg | ORAL_TABLET | ORAL | Status: DC | PRN

## 2020-02-21 MED ORDER — SODIUM CHLORIDE 0.9 % IV SOLN: INTRAVENOUS | Status: AC

## 2020-02-21 MED ORDER — LIDOCAINE HCL (PF) 1 % IJ SOLN
INTRAMUSCULAR | Status: AC
  Filled 2020-02-21: qty 30

## 2020-02-21 MED ORDER — LABETALOL HCL 5 MG/ML IV SOLN: 10.0000 mg | INTRAVENOUS | Status: DC | PRN

## 2020-02-21 MED ORDER — SODIUM CHLORIDE 0.9 % WEIGHT BASED INFUSION
3.0000 mL/kg/h | INTRAVENOUS | Status: DC
  Administered 2020-02-21: 3 mL/kg/h via INTRAVENOUS

## 2020-02-21 MED ORDER — LIDOCAINE HCL (PF) 1 % IJ SOLN
INTRAMUSCULAR | Status: DC | PRN
  Administered 2020-02-21: 2 mL
  Administered 2020-02-21: 20 mL
  Administered 2020-02-21: 2 mL

## 2020-02-21 MED ORDER — HYDRALAZINE HCL 20 MG/ML IJ SOLN: 10.0000 mg | INTRAMUSCULAR | Status: DC | PRN

## 2020-02-21 MED ORDER — SODIUM CHLORIDE 0.9% FLUSH: 3.0000 mL | Freq: Two times a day (BID) | INTRAVENOUS | Status: DC

## 2020-02-21 MED ORDER — SODIUM CHLORIDE 0.9% FLUSH: 3.0000 mL | INTRAVENOUS | Status: DC | PRN

## 2020-02-21 MED ORDER — MIDAZOLAM HCL 2 MG/2ML IJ SOLN
INTRAMUSCULAR | Status: DC | PRN
  Administered 2020-02-21 (×2): 1 mg via INTRAVENOUS

## 2020-02-21 MED ORDER — ONDANSETRON HCL 4 MG/2ML IJ SOLN
4.0000 mg | Freq: Four times a day (QID) | INTRAMUSCULAR | Status: DC | PRN
  Administered 2020-02-21: 4 mg via INTRAVENOUS

## 2020-02-21 MED ORDER — VERAPAMIL HCL 2.5 MG/ML IV SOLN
INTRAVENOUS | Status: AC
  Filled 2020-02-21: qty 2

## 2020-02-21 MED ORDER — HEPARIN (PORCINE) IN NACL 1000-0.9 UT/500ML-% IV SOLN
INTRAVENOUS | Status: AC
  Filled 2020-02-21: qty 1000

## 2020-02-21 MED ORDER — FENTANYL CITRATE (PF) 100 MCG/2ML IJ SOLN
INTRAMUSCULAR | Status: AC
  Filled 2020-02-21: qty 2

## 2020-02-21 MED ORDER — ONDANSETRON HCL 4 MG/2ML IJ SOLN
INTRAMUSCULAR | Status: AC
  Filled 2020-02-21: qty 2

## 2020-02-21 MED ORDER — FENTANYL CITRATE (PF) 100 MCG/2ML IJ SOLN
INTRAMUSCULAR | Status: DC | PRN
  Administered 2020-02-21 (×2): 25 ug via INTRAVENOUS

## 2020-02-21 MED ORDER — SODIUM CHLORIDE 0.9 % WEIGHT BASED INFUSION: 1.0000 mL/kg/h | INTRAVENOUS | Status: DC

## 2020-02-21 MED ORDER — ASPIRIN 81 MG PO CHEW: 81.0000 mg | CHEWABLE_TABLET | ORAL | Status: DC

## 2020-02-21 MED ORDER — MIDAZOLAM HCL 2 MG/2ML IJ SOLN
INTRAMUSCULAR | Status: AC
  Filled 2020-02-21: qty 2

## 2020-02-21 SURGICAL SUPPLY — 15 items
CATH BALLN WEDGE 5F 110CM (CATHETERS) ×1 IMPLANT
CATH INFINITI 5FR MULTPACK ANG (CATHETERS) ×1 IMPLANT
CATH OPTITORQUE TIG 4.0 5F (CATHETERS) ×1 IMPLANT
GLIDESHEATH SLEND SS 6F .021 (SHEATH) ×1 IMPLANT
GUIDEWIRE INQWIRE 1.5J.035X260 (WIRE) IMPLANT
INQWIRE 1.5J .035X260CM (WIRE) ×2
KIT HEART LEFT (KITS) ×2 IMPLANT
PACK CARDIAC CATHETERIZATION (CUSTOM PROCEDURE TRAY) ×2 IMPLANT
SHEATH GLIDE SLENDER 4/5FR (SHEATH) ×1 IMPLANT
SHEATH PINNACLE 5F 10CM (SHEATH) ×2 IMPLANT
SHEATH PROBE COVER 6X72 (BAG) ×1 IMPLANT
TRANSDUCER W/STOPCOCK (MISCELLANEOUS) ×2 IMPLANT
TUBING CIL FLEX 10 FLL-RA (TUBING) ×2 IMPLANT
WIRE EMERALD 3MM-J .025X260CM (WIRE) ×1 IMPLANT
WIRE EMERALD 3MM-J .035X150CM (WIRE) ×1 IMPLANT

## 2020-02-21 NOTE — Interval H&P Note (Signed)
History and Physical Interval Note:  02/21/2020 7:44 AM  Patrick Bell  has presented today for surgery, with the diagnosis of mitral valve workup.  The various methods of treatment have been discussed with the patient and family. After consideration of risks, benefits and other options for treatment, the patient has consented to  Procedure(s): RIGHT/LEFT HEART CATH AND CORONARY ANGIOGRAPHY (N/A) as a surgical intervention.  The patient's history has been reviewed, patient examined, no change in status, stable for surgery.  I have reviewed the patient's chart and labs.  Questions were answered to the patient's satisfaction.     Shelva Majestic

## 2020-02-21 NOTE — Interval H&P Note (Signed)
History and Physical Interval Note:  02/21/2020 7:45 AM  Patrick Bell  has presented today for surgery, with the diagnosis of mitral valve workup.  The various methods of treatment have been discussed with the patient and family. After consideration of risks, benefits and other options for treatment, the patient has consented to  Procedure(s): RIGHT/LEFT HEART CATH AND CORONARY ANGIOGRAPHY (N/A) as a surgical intervention.  The patient's history has been reviewed, patient examined, no change in status, stable for surgery.  I have reviewed the patient's chart and labs.  Questions were answered to the patient's satisfaction.     Shelva Majestic

## 2020-02-21 NOTE — Research (Signed)
[place] Informed Consent   Subject Name: Patrick Bell  Subject met inclusion and exclusion criteria.  The informed consent form, study requirements and expectations were reviewed with the subject and questions and concerns were addressed prior to the signing of the consent form.  The subject verbalized understanding of the trial requirements.  The subject agreed to participate in the [facility] trial and signed the informed consent at 0700 on 02/21/2020.  The informed consent was obtained prior to performance of any protocol-specific procedures for the subject.  A copy of the signed informed consent was given to the subject and a copy was placed in the subject's medical record.   Anjel Pardo

## 2020-02-21 NOTE — Progress Notes (Addendum)
Site area: Right groin a 5 french arterial sheath was removed  Site Prior to Removal:  Level 0  Pressure Applied For 35 MINUTES    Bedrest Beginning at 0940am  Manual:   Yes.    Patient Status During Pull:  stable  Post Pull Groin Site:  Level 0  Post Pull Instructions Given:  Yes.    Post Pull Pulses Present:  Yes.    Dressing Applied:  Yes.    Comments:

## 2020-02-21 NOTE — Telephone Encounter (Addendum)
RE: precert Reita Cliche, CMA No PA required for Cpap titration. Waiting on sleep lab to call back to schedule    ----- Message -----  From: Freada Bergeron, CMA  Sent: 02/05/2020  1:11 PM EST  To: Cv Div Sleep Studies  Subject: precert                      Therapeutic CPAP titration    Patient is scheduled for lab study on 03/19/20. Patient understands her sleep study will be done at [facility] sleep lab. Patient understands she will receive a sleep packet in a week or so. Patient understands to call if she does not receive the sleep packet in a timely manner. Patient (Pt having heart surgery and will call back to reschedule.

## 2020-02-21 NOTE — Discharge Instructions (Signed)
Femoral Site Care This sheet gives you information about how to care for yourself after your procedure. Your health care provider may also give you more specific instructions. If you have problems or questions, contact your health care provider. What can I expect after the procedure?  After the procedure, it is common to have:  Bruising that usually fades within 1-2 weeks.  Tenderness at the site. Follow these instructions at home: Wound care 1. Follow instructions from your health care provider about how to take care of your insertion site. Make sure you: ? Wash your hands with soap and water before you change your bandage (dressing). If soap and water are not available, use hand sanitizer. ? Remove your dressing as told by your health care provider. In 24 hours 2. Do not take baths, swim, or use a hot tub until your health care provider approves. 3. You may shower 24-48 hours after the procedure or as told by your health care provider. ? Gently wash the site with plain soap and water. ? Pat the area dry with a clean towel. ? Do not rub the site. This may cause bleeding. 4. Do not apply powder or lotion to the site. Keep the site clean and dry. 5. Check your femoral site every day for signs of infection. Check for: ? Redness, swelling, or pain. ? Fluid or blood. ? Warmth. ? Pus or a bad smell. Activity 1. For the first 2-3 days after your procedure, or as long as directed: ? Avoid climbing stairs as much as possible. ? Do not squat. 2. Do not lift anything that is heavier than 10 lb (4.5 kg), or the limit that you are told, until your health care provider says that it is safe. For 5 days 3. Rest as directed. ? Avoid sitting for a long time without moving. Get up to take short walks every 1-2 hours. 4. Do not drive for 24 hours if you were given a medicine to help you relax (sedative). General instructions  Take over-the-counter and prescription medicines only as told by your  health care provider.  Keep all follow-up visits as told by your health care provider. This is important. Contact a health care provider if you have:  A fever or chills.  You have redness, swelling, or pain around your insertion site. Get help right away if:  The catheter insertion area swells very fast.  You pass out.  You suddenly start to sweat or your skin gets clammy.  The catheter insertion area is bleeding, and the bleeding does not stop when you hold steady pressure on the area.  The area near or just beyond the catheter insertion site becomes pale, cool, tingly, or numb. These symptoms may represent a serious problem that is an emergency. Do not wait to see if the symptoms will go away. Get medical help right away. Call your local emergency services (911 in the [place] Do not drive yourself to the hospital. Summary  After the procedure, it is common to have bruising that usually fades within 1-2 weeks.  Check your femoral site every day for signs of infection.  Do not lift anything that is heavier than 10 lb (4.5 kg), or the limit that you are told, until your health care provider says that it is safe. This information is not intended to replace advice given to you by your health care provider. Make sure you discuss any questions you have with your health care provider. Document Revised: 04/03/2017 Document Reviewed: 04/03/2017   [employer]vier Patient Education  2020 [employer]   

## 2020-02-24 ENCOUNTER — Encounter (HOSPITAL_COMMUNITY): Payer: Self-pay | Admitting: Cardiovascular Disease

## 2020-02-24 MED FILL — Verapamil HCl IV Soln 2.5 MG/ML: INTRAVENOUS | Qty: 2 | Status: AC

## 2020-03-02 ENCOUNTER — Other Ambulatory Visit: Payer: Self-pay

## 2020-03-02 ENCOUNTER — Ambulatory Visit: Payer: Medicare Other | Admitting: Thoracic Surgery (Cardiothoracic Vascular Surgery)

## 2020-03-02 ENCOUNTER — Other Ambulatory Visit: Payer: Self-pay | Admitting: Thoracic Surgery (Cardiothoracic Vascular Surgery)

## 2020-03-02 ENCOUNTER — Encounter: Payer: Self-pay | Admitting: Thoracic Surgery (Cardiothoracic Vascular Surgery)

## 2020-03-02 VITALS — BP 159/81 | HR 93 | Resp 18 | Ht 70.0 in

## 2020-03-02 DIAGNOSIS — I340 Nonrheumatic mitral (valve) insufficiency: Secondary | ICD-10-CM

## 2020-03-02 NOTE — Progress Notes (Signed)
[address]       ,[place] [address]             [phone]     CARDIOTHORACIC SURGERY OFFICE NOTE  Referring Provider is Troy Sine, MD PCP is Dettinger, Fransisca Kaufmann, MD   HPI:  Patient is an 79 year old male with history of hypertension, obstructive sleep apnea, hyperlipidemia, GE reflux disease, and remote history of heavy tobacco use who returns to the office today for follow-up prior to elective mitral valve repair for mitral valve prolapse with severe primary mitral regurgitation.  He was originally seen in consultation on January 09, 2020.  Since then he underwent diagnostic cardiac catheterization by Dr. Claiborne Billings on February 21, 2020.  He reports no new problems or complaints over the past 6 weeks although he does note that he has developed what he believes is an enlarging moderate-sized pulsatile lump in his right groin since his heart catheterization.  He states that he only gets short of breath with strenuous exertion and this has not progressed.  He has not had any significant chest pain or palpitations.  He denies any fevers, chills, or productive cough.  He has not been exposed to any persons with known or suspected COVID-19 infection.  His wife did have a recent upper respiratory tract infection but she was tested negative for COVID-19.  Both have been vaccinated.    Current Outpatient Medications  Medication Sig Dispense Refill  . acetaminophen (TYLENOL) 500 MG tablet Take 500 mg by mouth daily as needed (pain.).    Marland Kitchen fexofenadine-pseudoephedrine (ALLEGRA-D) 60-120 MG 12 hr tablet Take 1 tablet by mouth as needed.    . hydroxypropyl methylcellulose / hypromellose (ISOPTO TEARS / GONIOVISC) 2.5 % ophthalmic solution Place 1-2 drops into both eyes 3 (three) times daily as needed (dry/irritated eyes.).    Marland Kitchen losartan (COZAAR) 50 MG tablet Take 1 tablet (50 mg total) by mouth daily. (Patient taking differently: Take 50 mg by mouth every evening. ) 90 tablet 1   . montelukast (SINGULAIR) 10 MG tablet Take 1 tablet (10 mg total) daily by mouth. (Patient taking differently: Take 10 mg by mouth at bedtime. ) 90 tablet 3  . Olopatadine HCl (PATADAY) 0.2 % SOLN Apply to eye as needed (allergies).    . pantoprazole (PROTONIX) 40 MG tablet Take 1 tablet (40 mg total) daily by mouth. (Patient taking differently: Take 40 mg by mouth at bedtime. ) 90 tablet 3  . PROAIR HFA 108 (90 Base) MCG/ACT inhaler Inhale 2 puffs into the lungs every 6 (six) hours as needed for wheezing. (Patient taking differently: Inhale 2 puffs into the lungs every 6 (six) hours as needed for wheezing. ) 8.5 g 2  . simvastatin (ZOCOR) 10 MG tablet Take 1 tablet (10 mg total) by mouth at bedtime. 90 tablet 3   No current facility-administered medications for this visit.      Physical Exam:   BP (!) 159/81 (BP Location: Right Arm, Patient Position: Sitting)   Pulse 93   Resp 18   Ht 5\' 10"  (1.778 m)   SpO2 96% Comment: RA with mask on  BMI 26.54 kg/m   General:  Well-appearing  Chest:   Clear to auscultation with symmetrical breath sounds  CV:   Regular rate and rhythm with prominent holosystolic murmur  Incisions:  Mildly tender medium sized pulsatile mass in the right groin consistent with small false aneurysm versus hematoma  Abdomen:  Soft nontender  Extremities:  Warm and well-perfused  Diagnostic Tests:  CT ANGIOGRAPHY CHEST, ABDOMEN AND PELVIS  TECHNIQUE: Multidetector CT imaging through the chest, abdomen and pelvis was performed using the standard protocol during bolus administration of intravenous contrast. Multiplanar reconstructed images and MIPs were obtained and reviewed to evaluate the vascular anatomy.  CONTRAST:  110mL ISOVUE-300 IOPAMIDOL (ISOVUE-300) INJECTION 61%  COMPARISON:  03/02/2018 CT abdomen pelvis  FINDINGS: CTA CHEST FINDINGS  Cardiovascular: Minor thoracic aortic atherosclerosis without acute vascular finding. No aneurysm or  dissection. Patent 3 vessel arch anatomy.  Visualized central pulmonary arteries are patent. No significant filling defect or pulmonary embolus.  Heart is enlarged. There is mild dilatation of the left atrium and left ventricle compatible with known severe mitral valvular disease.  Pulmonary veins all appear patent. No significant left atrial appendage abnormality or thrombus.  Innominate vein and SVC appear patent. No central veno-occlusive process.  Mediastinum/Nodes: No enlarged mediastinal, hilar, or axillary lymph nodes. Thyroid gland, trachea, and esophagus demonstrate no significant findings.  Lungs/Pleura: Minor centrilobular emphysema in the upper lobes.  Minimal bibasilar atelectasis/scarring. Minimal inferior lingula and right middle lobe atelectasis/scarring. Peripheral left upper lobe minor tree in bud opacity, images 54 through 58 series 8.  No interstitial process or edema.  Trachea and central airways are patent.  No pleural abnormality, effusion or pneumothorax.  Musculoskeletal: No acute osseous finding. Degenerative changes of the spine. Intact sternum.  Review of the MIP images confirms the above findings.  CTA ABDOMEN AND PELVIS FINDINGS  VASCULAR  Aorta: Minor abdominal aortic atherosclerosis without aneurysm, occlusive process, or dissection. No acute aortic abnormality. Normal caliber.  Celiac: Widely patent origin including its branches.  SMA: Widely patent including its branches.  Renals: Both main renal arteries appear patent. No renal vascular occlusive process. No accessory renal vasculature.  IMA: Remains patent off the distal aorta including its branches.  Inflow: Minor iliac atherosclerosis and tortuosity without inflow disease or occlusion. No acute iliac arterial abnormality. The common, internal and external iliac arteries all remain patent and normal in caliber.  Visualized common femoral, proximal  profunda femoral, proximal superficial femoral arteries are widely patent and normal in caliber.  Veins: Dedicated venous imaging not performed.  Review of the MIP images confirms the above findings.  NON-VASCULAR  Hepatobiliary: No focal liver abnormality is seen. No gallstones, gallbladder wall thickening, or biliary dilatation.  Pancreas: Unremarkable. No pancreatic ductal dilatation or surrounding inflammatory changes.  Spleen: Normal in size without focal abnormality.  Adrenals/Urinary Tract: Adrenal glands are unremarkable. Kidneys are normal, without renal calculi, focal lesion, or hydronephrosis. Bladder is unremarkable.  Stomach/Bowel: Limited without oral contrast. No significant dilatation, ileus, obstruction pattern, free air. Normal appendix. Diverticulosis noted, most pronounced in the sigmoid region. No acute inflammatory process.  No free fluid, fluid collection, hemorrhage, hematoma, or abscess.  Lymphatic: No bulky adenopathy.  Reproductive: Remote vasectomy changes.  Other: Stable postop changes of inguinal hernia repairs. No recurrent hernia. Intact abdominal wall.  No ascites.  Musculoskeletal: Degenerative changes noted spine. Mild associated scoliosis. With no acute compression fracture.  Review of the MIP images confirms the above findings.  IMPRESSION: Intact thoracoabdominal aorta with minor atherosclerotic change. No acute aortic vascular process, occlusive disease, or dissection.  No significant acute central pulmonary embolus.  Left atrial and left ventricular chamber dilatation compatible with chronic mitral valvular disease.  No other acute thoracic, abdominal or pelvic finding. additional incidental and postoperative findings as above.  Aortic Atherosclerosis (ICD10-I70.0).   Electronically Signed  By: Eugenie Filler M.D.   On: 01/29/2020 11:12    RIGHT/LEFT HEART CATH AND CORONARY ANGIOGRAPHY   Conclusion  Normal right heart pressures with PA mean pressure at 20 mmHg.  Normal coronary arteries.  LVEDP: 10mmHg  RECOMMENDATION: Mr. Chadley Dziedzic is an 79 year old gentleman who was recently found to have a cardiac murmur.  He has subsequently been found to have severe mitral regurgitation in the setting of normal LV function with an EF of 60 to 65%.  He is felt to have severe prolapse involving the portion of the posterior leaflet of the mitral valve with at least one ruptured primary chordae tendineae with a flail segment causing severe mitral regurgitation.  Presently, right heart pressures are essentially normal with borderline increased PA systolic pressure at 31 mm.  Coronary anatomy is normal.  He has undergone CT angiography of his vasculature ordered by Dr. Roxy Manns.  He will be following up with Dr. Roxy Manns and is tentatively scheduled for mitral valve repair surgery on March 11, 2020. Recommendations  Antiplatelet/Anticoag No indication for antiplatelet therapy at this time .  Indications  Severe mitral regurgitation [I34.0 (ICD-10-CM)]  Procedural Details  Technical Details Mr Mikolaj Woolstenhulme is an active 78 year old who has has a history of hypertension, hyperlipidemia, asthma as well as a history of obstructive sleep apnea.   Over the past several months he began to notice mild shortness of breath with significant activity.  An echo doppler demonstrated normal LV function with EF 60 - 19% with holosystolic prolapse of the posterior leaflet and severe MR.  TEE was performed which confirmed the presence of mitral valve prolapse with a flail segment involving a portion of the posterior leaflet and severe mitral regurgitation.  He was evaluated by Dr. Roxy Manns and is scheduled to undergo mitral valve repair.  He has undergone extensive CT angio imaging of his vasculature.  He presents now for right and left heart cardiac catheterization prior to his planned elective mitral valve repair  surgery.  The patient was brought to the cardiac catheterization lab in the fasting state. The patient was premedicated with Versed 1 mg and fentanyl 25 mcg.  An IV catheter had already been placed in the right brachial vein.  This was exchanged for a 5 [place] glide sheath slender sheath.  Right heart catheterization was performed and pressures were obtained in the RA, RV, PA, and pulmonary capillary wedge positions.  O2 saturation was obtained in the pulmonary artery x2.  Ultrasound guidance was used for right radial access.  However, despite getting into the vessel on at least 3 occasions the vessel seemed to significantly constrict during systole and the wire was never able to be advanced above the mid forearm.  As result the radial approach was aborted.  The right femoral artery had been prepped and draped in sterile fashion.  Ultrasound guidance was used to confirm vessel location.  The patient received an additional Versed 1 mg and fentanyl 25 mcg.  Right femoral artery was punctured anteriorly with 1 stick and a 5 French sheath was inserted without difficulty.  The JR4 catheter was advanced to the ascending aorta.  Central aortic oxygen saturation was obtained.  Selective angiography into the right coronary artery was performed.  The right catheter was then passed into the left ventricle for left ventricular end-diastolic pressure recording.  Since the patient had previous noninvasive assessment of severe mitral regurgitation as well as vascular imaging left-ventricular graphic was not performed.  The RCA catheter was  removed and a 5 [place] JL4 left coronary catheter was inserted.  The right heart catheter was removed.  Selective angiography in the left coronary system was performed without difficulty.  All catheters were removed from the patient.  Hemostasis was obtained by direct manual pressure. The patient left the catheterization laboratory in stable condition.   Estimated blood loss <50 mL.    During this procedure medications were administered to achieve and maintain moderate conscious sedation while the patient's heart rate, blood pressure, and oxygen saturation were continuously monitored and I was present face-to-face 100% of this time.  Medications (Filter: Administrations occurring from 0715 to 0856 on 02/21/20) (important) Continuous medications are totaled by the amount administered until 02/21/20 0856.  Heparin (Porcine) in NaCl 1000-0.9 UT/500ML-% SOLN (mL) Total volume:  500 mL Date/Time  Rate/Dose/Volume Action  02/21/20 0744  500 mL Given    Heparin (Porcine) in NaCl 1000-0.9 UT/500ML-% SOLN (mL) Total volume:  500 mL Date/Time  Rate/Dose/Volume Action  02/21/20 0745  500 mL Given    fentaNYL (SUBLIMAZE) injection (mcg) Total dose:  50 mcg Date/Time  Rate/Dose/Volume Action  02/21/20 0748  25 mcg Given  0826  25 mcg Given    midazolam (VERSED) injection (mg) Total dose:  2 mg Date/Time  Rate/Dose/Volume Action  02/21/20 0748  1 mg Given  0819  1 mg Given    lidocaine (PF) (XYLOCAINE) 1 % injection (mL) Total volume:  24 mL Date/Time  Rate/Dose/Volume Action  02/21/20 0751  2 mL Given  0752  2 mL Given  0823  20 mL Given    Sedation Time  Sedation Time Physician-1: 49 minutes 26 seconds  Radiation/Fluoro  Fluoro time: 6.2 (min) DAP: 29213 (mGycm2) Cumulative Air Kerma: 492 (mGy)  Coronary Findings  Diagnostic Dominance: Right No diagnostic findings have been documented. Intervention  No interventions have been documented. Right Heart  Right Heart Pressures RA: A-wave 7, V wave 5; mean 4 RV: 29/7 PA: 31/11; mean 20 PW: A-wave 15, V wave 16-20; mean 13 Initial AO: 148/78  Pullback: LV: 148/16 AO: 149/79  Oxygen saturation in the central aorta 98% and in the pulmonary artery 73%.  By the Fick method, cardiac output 5.2 L/min with a cardiac index of 2.6 L/min/m  PVR: 1.4 WU  Coronary Diagrams  Diagnostic Dominance:  Right  Intervention  Implants   No implant documentation for this case.  Syngo Images  Show images for CARDIAC CATHETERIZATION Images on Long Term Storage  Show images for Blessing, Ozga to Procedure Log  Procedure Log    Hemo Data   Most Recent Value  Fick Cardiac Output 5.2 L/min  Fick Cardiac Output Index 2.57 (L/min)/BSA  Aortic Mean Gradient 10.8 mmHg  Aortic Peak Gradient 0 mmHg  Aortic Valve Area >3.50  Aortic Value Area Index 1.73 cm2/BSA  RA A Wave 7 mmHg  RA V Wave 5 mmHg  RA Mean 4 mmHg  RV Systolic Pressure 29 mmHg  RV Diastolic Pressure 0 mmHg  RV EDP 7 mmHg  PA Systolic Pressure 30 mmHg  PA Diastolic Pressure 10 mmHg  PA Mean 18 mmHg  PW A Wave 15 mmHg  PW V Wave 16 mmHg  PW Mean 13 mmHg  AO Systolic Pressure 158 mmHg  AO Diastolic Pressure 78 mmHg  AO Mean 309 mmHg  LV Systolic Pressure 407 mmHg  LV Diastolic Pressure 1 mmHg  LV EDP 16 mmHg  AOp Systolic Pressure 680 mmHg  AOp Diastolic Pressure 79 mmHg  AOp Mean Pressure 017 mmHg  LVp Systolic Pressure 510 mmHg  LVp Diastolic Pressure 1 mmHg  LVp EDP Pressure 16 mmHg  QP/QS 1  TPVR Index 7.78 HRUI  TSVR Index 41.96 HRUI  PVR SVR Ratio 0.07  TPVR/TSVR Ratio 0.19      Impression:  Patient has mitral valve prolapse with at least stage C and probably early stage D severe symptomatic primary mitral regurgitation.  He describes stable symptoms of exertional shortness of breath that occur only with strenuous physical exertion, consistent with chronic diastolic congestive heart failure, New York Heart Association functional class I.  He also has developed a small enlarging pulsatile mass in the right groin since his recent diagnostic cardiac catheterization.  I have personally reviewed the patient's transthoracic and transesophageal echocardiograms, CT angiograms, and diagnostic cardiac catheterization.  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  There is at least one ruptured primary chordae tendinae with a flail segment causing severe mitral regurgitation.  The jet of regurgitation is eccentric and directed around the anterior portion of the left atrium.  Left ventricular size and function appears normal.  No other significant abnormalities are noted.  CT angiography reveals no contraindications to peripheral cannulation for surgery.  Diagnostic cardiac catheterization reveals normal coronary artery anatomy with no significant coronary artery disease and normal right heart pressures.  I agree the patient would likely benefit from elective mitral valve repair.  Based upon review of the patient's TEE I feel there is greater than 98% likelihood of successful and durable valve repair with anticipated risk of surgery less than 1%.  The patient appears to be a good candidate for minimally invasive approach for surgery.   Plan:  The patient and his wife were again counseled at length regarding the indications, risks and potential benefits of mitral valve repair.  The rationale for elective surgery has been explained, including a comparison between surgery and continued medical therapy with close follow-up.  The likelihood of successful and durable mitral valve repair has been discussed with particular reference to the findings of their recent echocardiogram.  Based upon these findings and previous experience, I have quoted them a greater than 98 percent likelihood of successful valve repair with less than 1 percent risk of mortality or major morbidity.  Alternative surgical approaches have been discussed including a comparison between conventional sternotomy and minimally-invasive techniques.  The relative risks and benefits of each have been reviewed as they pertain to the patient's specific circumstances, and expectations for the patient's postoperative convalescence  has been discussed.  The patient desires to proceed with elective aortic valve repair via right minithoracotomy approach as previously scheduled on March 11, 2020.  Prior to surgery the patient will need to undergo right groin ultrasound to evaluate whether or not he may have false aneurysm from his recent diagnostic cardiac catheterization.  The patient understands and accepts all potential risks of surgery including but not limited to risk of death, stroke or other neurologic complication, myocardial infarction, congestive heart failure, respiratory failure, renal failure, bleeding requiring transfusion and/or reexploration, arrhythmia, infection or other wound complications, pneumonia, pleural and/or pericardial effusion, pulmonary embolus, aortic dissection or other major vascular complication, or delayed complications related to valve repair or replacement including but not limited to structural valve deterioration and failure, thrombosis, embolization, endocarditis, or paravalvular leak.  Specific risks potentially related to the minimally-invasive approach were discussed at length,  including but not limited to risk of conversion to full or partial sternotomy, aortic dissection or other major vascular complication, unilateral acute lung injury or pulmonary edema, phrenic nerve dysfunction or paralysis, rib fracture, chronic pain, lung hernia, or lymphocele. All of their questions have been answered.    I spent in excess of 30 minutes during the conduct of this office consultation and >50% of this time involved direct face-to-face encounter with the patient for counseling and/or coordination of their care.    Valentina Gu. Roxy Manns, MD 03/02/2020 11:52 AM

## 2020-03-02 NOTE — Patient Instructions (Signed)
   Continue taking all current medications without change through the day before surgery.  Make sure to bring all of your medications with you when you come for your Pre-Admission Testing appointment at [facility] Short-Stay Department.  Have nothing to eat or drink after midnight the night before surgery.  On the morning of surgery take only Protonix with a sip of water.  At your appointment for Pre-Admission Testing at the [facility] Short-Stay Department you will be asked to sign permission forms for your upcoming surgery.  By definition your signature on these forms implies that you and/or your designee provide full informed consent for your planned surgical procedure(s), that alternative treatment options have been discussed, that you understand and accept any and all potential risks, and that you have some understanding of what to expect for your post-operative convalescence.  For any major cardiac surgical procedure potential operative risks include but are not limited to at least some risk of death, stroke or other neurologic complication, myocardial infarction, congestive heart failure, respiratory failure, renal failure, bleeding requiring blood transfusion and/or reexploration, irregular heart rhythm, heart block or bradycardia requiring permanent pacemaker, pneumonia, pericardial effusion, pleural effusion, wound infection, pulmonary embolus or other thromboembolic complication, chronic pain, or other complications related to the specific procedure(s) performed.  Please call to schedule a follow-up appointment in our office prior to surgery if you have any unresolved questions about your planned surgical procedure, the associated risks, alternative treatment options, and/or expectations for your post-operative recovery.

## 2020-03-03 ENCOUNTER — Ambulatory Visit (HOSPITAL_COMMUNITY)
Admission: RE | Admit: 2020-03-03 | Discharge: 2020-03-03 | Disposition: A | Discharge status: Home / Self Care | Payer: Medicare Other | Source: Ambulatory Visit | Attending: Thoracic Surgery (Cardiothoracic Vascular Surgery) | Admitting: Thoracic Surgery (Cardiothoracic Vascular Surgery)

## 2020-03-03 DIAGNOSIS — I340 Nonrheumatic mitral (valve) insufficiency: Secondary | ICD-10-CM | POA: Insufficient documentation

## 2020-03-03 NOTE — Progress Notes (Signed)
Limited right lower extremity artery duplex completed. Refer to "CV Proc" under chart review to view preliminary results.  03/03/2020 10:39 AM Kelby Aline., MHA, RVT, RDCS, RDMS

## 2020-03-06 ENCOUNTER — Ambulatory Visit (INDEPENDENT_AMBULATORY_CARE_PROVIDER_SITE_OTHER): Payer: Medicare Other | Admitting: Family Medicine

## 2020-03-06 ENCOUNTER — Encounter: Payer: Self-pay | Admitting: Family Medicine

## 2020-03-06 DIAGNOSIS — J40 Bronchitis, not specified as acute or chronic: Secondary | ICD-10-CM

## 2020-03-06 DIAGNOSIS — I340 Nonrheumatic mitral (valve) insufficiency: Secondary | ICD-10-CM | POA: Diagnosis not present

## 2020-03-06 DIAGNOSIS — J329 Chronic sinusitis, unspecified: Secondary | ICD-10-CM | POA: Diagnosis not present

## 2020-03-06 MED ORDER — BENZONATATE 200 MG PO CAPS: 200.0000 mg | ORAL_CAPSULE | Freq: Three times a day (TID) | ORAL | 0 refills | Status: DC | PRN

## 2020-03-06 MED ORDER — AZITHROMYCIN 250 MG PO TABS: ORAL_TABLET | ORAL | 0 refills | Status: DC

## 2020-03-06 NOTE — Progress Notes (Signed)
Subjective:    Patient ID: Patrick Bell, male    DOB: 15-Dec-1940, 79 y.o.   MRN: [id number]   HPI: Patrick Bell is a 79 y.o. male presenting for 2-3 days of nasal drip, slight sore throat and cough. Cough producing clear sputum. Denies fever. Slight chills for 3 minutes this morning. One sweating spell during the night. Using Allegra D, robitussin.  Having Mitral valve repair surgery on 12/8.    Depression screen [facility] 2/9 11/14/2019 09/11/2019 05/17/2019 01/29/2019 08/28/2018  Decreased Interest 0 0 0 0 0  Down, Depressed, Hopeless 0 0 0 0 0  PHQ - 2 Score 0 0 0 0 0  Altered sleeping - - - - -  Tired, decreased energy - - - - -  Change in appetite - - - - -  Feeling bad or failure about yourself  - - - - -  Trouble concentrating - - - - -  Moving slowly or fidgety/restless - - - - -  Suicidal thoughts - - - - -  PHQ-9 Score - - - - -  Difficult doing work/chores - - - - -     Relevant past medical, surgical, family and social history reviewed and updated as indicated.  Interim medical history since our last visit reviewed. Allergies and medications reviewed and updated.  ROS:  Review of Systems  Constitutional: Negative for activity change and appetite change.  HENT: Positive for congestion, postnasal drip and sore throat. Negative for ear pain, hearing loss and nosebleeds.   Respiratory: Positive for cough. Negative for shortness of breath and wheezing.   Cardiovascular: Negative for chest pain.     Social History   Tobacco Use  Smoking Status Former Smoker  . Packs/day: 4.00  . Years: 25.00  . Pack years: 100.00  . Types: Cigarettes  . Quit date: 06/23/1982  . Years since quitting: 37.7  Smokeless Tobacco Never Used  Tobacco Comment   pt was a truck driver       Objective:     Wt Readings from Last 3 Encounters:  02/21/20 185 lb (83.9 kg)  02/20/20 186 lb (84.4 kg)  01/30/20 186 lb (84.4 kg)     Exam deferred. Pt. Harboring due to COVID 19. Phone  visit performed.   Assessment & Plan:   1. Severe mitral regurgitation by prior echocardiogram   2. Sinobronchitis     Meds ordered this encounter  Medications  . azithromycin (ZITHROMAX Z-PAK) 250 MG tablet    Sig: Take two right away Then one a day for the next 4 days.    Dispense:  6 each    Refill:  0  . benzonatate (TESSALON) 200 MG capsule    Sig: Take 1 capsule (200 mg total) by mouth 3 (three) times daily as needed for cough.    Dispense:  20 capsule    Refill:  0     Virtual Visit via telephone Note  I discussed the limitations, risks, security and privacy concerns of performing an evaluation and management service by telephone and the availability of in person appointments. The patient was identified with two identifiers. Pt.expressed understanding and agreed to proceed. Pt. Is at home. Dr. Livia Snellen is in his office.  Follow Up Instructions:   I discussed the assessment and treatment plan with the patient. The patient was provided an opportunity to ask questions and all were answered. The patient agreed with the plan and demonstrated an understanding of the instructions.  The patient was advised to call back or seek an in-person evaluation if the symptoms worsen or if the condition fails to improve as anticipated.   Total minutes including chart review and phone contact time: 17   Follow up plan: Return if symptoms worsen or fail to improve.  Claretta Fraise, MD [place]

## 2020-03-06 NOTE — Progress Notes (Signed)
[place], [address] [place] [address] Phone: [phone] Fax: [phone]      Your procedure is scheduled on Wednesday December 8th.  Report to [facility] Main Entrance "A" at 6:30 A.M., and check in at the Admitting office.  Call this number if you have problems the morning of surgery:  [phone]  Call [phone] if you have any questions prior to your surgery date Monday-Friday 8am-4pm    Remember:  Do not eat or drink anything after midnight the night before your surgery    Take these medicines the morning of surgery with A SIP OF WATER IF NEEDED     acetaminophen (TYLENOL) 500 MG tablet  hydroxypropyl methylcellulose / hypromellose (ISOPTO TEARS / GONIOVISC) 2.5 % ophthalmic solution  Olopatadine HCl (PATADAY) 0.2 % SOLN  PROAIR HFA 108 (90 Base) MCG/ACT inhaler    As of today, STOP taking any Aspirin (unless otherwise instructed by your surgeon) Aleve, Naproxen, Ibuprofen, Motrin, Advil, Goody's, BC's, all herbal medications, fish oil, and all vitamins.                      Do not wear jewelry            Do not wear lotions, powders, colognes, or deodorant.            Do not shave 48 hours prior to surgery.  Men may shave face and neck.            Do not bring valuables to the hospital.            [facility] is not responsible for any belongings or valuables.  Do NOT Smoke (Tobacco/Vaping) or drink Alcohol 24 hours prior to your procedure If you use a CPAP at night, you may bring all equipment for your overnight stay.   Contacts, glasses, dentures or bridgework may not be worn into surgery.      For patients admitted to the hospital, discharge time will be determined by your treatment team.   Patients discharged the day of surgery will not be allowed to drive home, and someone needs to stay with them for 24 hours.    Special instructions:   [place]- Preparing For Surgery  Before surgery, you  can play an important role. Because skin is not sterile, your skin needs to be as free of germs as possible. You can reduce the number of germs on your skin by washing with CHG (chlorahexidine gluconate) Soap before surgery.  CHG is an antiseptic cleaner which kills germs and bonds with the skin to continue killing germs even after washing.    Oral Hygiene is also important to reduce your risk of infection.  Remember - BRUSH YOUR TEETH THE MORNING OF SURGERY WITH YOUR REGULAR TOOTHPASTE  Please do not use if you have an allergy to CHG or antibacterial soaps. If your skin becomes reddened/irritated stop using the CHG.  Do not shave (including legs and underarms) for at least 48 hours prior to first CHG shower. It is OK to shave your face.  Please follow these instructions carefully.   1. Shower the NIGHT BEFORE SURGERY and the MORNING OF SURGERY with CHG Soap.   2. If you chose to wash your hair, wash your hair first as usual with your normal shampoo.  3. After you shampoo, rinse your hair and body thoroughly to remove the shampoo.  4. Use CHG as you would  any other liquid soap. You can apply CHG directly to the skin and wash gently with a scrungie or a clean washcloth.   5. Apply the CHG Soap to your body ONLY FROM THE NECK DOWN.  Do not use on open wounds or open sores. Avoid contact with your eyes, ears, mouth and genitals (private parts). Wash Face and genitals (private parts)  with your normal soap.   6. Wash thoroughly, paying special attention to the area where your surgery will be performed.  7. Thoroughly rinse your body with warm water from the neck down.  8. DO NOT shower/wash with your normal soap after using and rinsing off the CHG Soap.  9. Pat yourself dry with a CLEAN TOWEL.  10. Wear CLEAN PAJAMAS to bed the night before surgery  11. Place CLEAN SHEETS on your bed the night of your first shower and DO NOT SLEEP WITH PETS.   Day of Surgery: Wear Clean/Comfortable  clothing the morning of surgery Do not apply any deodorants/lotions.   Remember to brush your teeth WITH YOUR REGULAR TOOTHPASTE.   Please read over the following fact sheets that you were given.

## 2020-03-09 ENCOUNTER — Encounter (HOSPITAL_COMMUNITY): Payer: Self-pay

## 2020-03-09 ENCOUNTER — Encounter (HOSPITAL_COMMUNITY)
Admission: RE | Admit: 2020-03-09 | Discharge: 2020-03-09 | Disposition: A | Discharge status: Home / Self Care | Payer: Medicare Other | Source: Ambulatory Visit | Attending: Thoracic Surgery (Cardiothoracic Vascular Surgery) | Admitting: Thoracic Surgery (Cardiothoracic Vascular Surgery)

## 2020-03-09 ENCOUNTER — Ambulatory Visit: Payer: Medicare Other | Admitting: Thoracic Surgery (Cardiothoracic Vascular Surgery)

## 2020-03-09 ENCOUNTER — Ambulatory Visit (HOSPITAL_BASED_OUTPATIENT_CLINIC_OR_DEPARTMENT_OTHER)
Admission: RE | Admit: 2020-03-09 | Discharge: 2020-03-09 | Disposition: A | Discharge status: Home / Self Care | Payer: Medicare Other | Source: Ambulatory Visit | Attending: Thoracic Surgery (Cardiothoracic Vascular Surgery) | Admitting: Thoracic Surgery (Cardiothoracic Vascular Surgery)

## 2020-03-09 ENCOUNTER — Other Ambulatory Visit: Payer: Self-pay

## 2020-03-09 ENCOUNTER — Ambulatory Visit (HOSPITAL_COMMUNITY)
Admission: RE | Admit: 2020-03-09 | Discharge: 2020-03-09 | Disposition: A | Discharge status: Home / Self Care | Payer: Medicare Other | Source: Ambulatory Visit | Attending: Thoracic Surgery (Cardiothoracic Vascular Surgery) | Admitting: Thoracic Surgery (Cardiothoracic Vascular Surgery)

## 2020-03-09 ENCOUNTER — Other Ambulatory Visit (HOSPITAL_COMMUNITY)
Admission: RE | Admit: 2020-03-09 | Discharge: 2020-03-09 | Disposition: A | Discharge status: Home / Self Care | Payer: Medicare Other | Source: Ambulatory Visit | Attending: Thoracic Surgery (Cardiothoracic Vascular Surgery) | Admitting: Thoracic Surgery (Cardiothoracic Vascular Surgery)

## 2020-03-09 DIAGNOSIS — G4733 Obstructive sleep apnea (adult) (pediatric): Secondary | ICD-10-CM | POA: Diagnosis not present

## 2020-03-09 DIAGNOSIS — E7800 Pure hypercholesterolemia, unspecified: Secondary | ICD-10-CM | POA: Diagnosis not present

## 2020-03-09 DIAGNOSIS — I340 Nonrheumatic mitral (valve) insufficiency: Secondary | ICD-10-CM | POA: Diagnosis not present

## 2020-03-09 DIAGNOSIS — I951 Orthostatic hypotension: Secondary | ICD-10-CM | POA: Diagnosis not present

## 2020-03-09 DIAGNOSIS — R110 Nausea: Secondary | ICD-10-CM | POA: Diagnosis not present

## 2020-03-09 DIAGNOSIS — R791 Abnormal coagulation profile: Secondary | ICD-10-CM | POA: Diagnosis not present

## 2020-03-09 DIAGNOSIS — E785 Hyperlipidemia, unspecified: Secondary | ICD-10-CM | POA: Diagnosis not present

## 2020-03-09 DIAGNOSIS — R0602 Shortness of breath: Secondary | ICD-10-CM | POA: Diagnosis not present

## 2020-03-09 DIAGNOSIS — I083 Combined rheumatic disorders of mitral, aortic and tricuspid valves: Secondary | ICD-10-CM | POA: Diagnosis not present

## 2020-03-09 DIAGNOSIS — I130 Hypertensive heart and chronic kidney disease with heart failure and stage 1 through stage 4 chronic kidney disease, or unspecified chronic kidney disease: Secondary | ICD-10-CM | POA: Diagnosis not present

## 2020-03-09 DIAGNOSIS — I9589 Other hypotension: Secondary | ICD-10-CM | POA: Diagnosis not present

## 2020-03-09 DIAGNOSIS — J90 Pleural effusion, not elsewhere classified: Secondary | ICD-10-CM | POA: Diagnosis not present

## 2020-03-09 DIAGNOSIS — Z79899 Other long term (current) drug therapy: Secondary | ICD-10-CM | POA: Diagnosis not present

## 2020-03-09 DIAGNOSIS — J45909 Unspecified asthma, uncomplicated: Secondary | ICD-10-CM | POA: Diagnosis not present

## 2020-03-09 DIAGNOSIS — N189 Chronic kidney disease, unspecified: Secondary | ICD-10-CM | POA: Diagnosis not present

## 2020-03-09 DIAGNOSIS — D62 Acute posthemorrhagic anemia: Secondary | ICD-10-CM | POA: Diagnosis not present

## 2020-03-09 DIAGNOSIS — N170 Acute kidney failure with tubular necrosis: Secondary | ICD-10-CM | POA: Diagnosis not present

## 2020-03-09 DIAGNOSIS — N400 Enlarged prostate without lower urinary tract symptoms: Secondary | ICD-10-CM | POA: Diagnosis present

## 2020-03-09 DIAGNOSIS — Z8249 Family history of ischemic heart disease and other diseases of the circulatory system: Secondary | ICD-10-CM | POA: Diagnosis not present

## 2020-03-09 DIAGNOSIS — I7102 Dissection of abdominal aorta: Secondary | ICD-10-CM | POA: Diagnosis not present

## 2020-03-09 DIAGNOSIS — I5033 Acute on chronic diastolic (congestive) heart failure: Secondary | ICD-10-CM | POA: Diagnosis not present

## 2020-03-09 DIAGNOSIS — K219 Gastro-esophageal reflux disease without esophagitis: Secondary | ICD-10-CM | POA: Diagnosis not present

## 2020-03-09 DIAGNOSIS — I7100 Dissection of unspecified site of aorta: Secondary | ICD-10-CM | POA: Diagnosis not present

## 2020-03-09 DIAGNOSIS — I7101 Dissection of thoracic aorta: Secondary | ICD-10-CM | POA: Diagnosis not present

## 2020-03-09 DIAGNOSIS — I4819 Other persistent atrial fibrillation: Secondary | ICD-10-CM | POA: Diagnosis not present

## 2020-03-09 DIAGNOSIS — J80 Acute respiratory distress syndrome: Secondary | ICD-10-CM | POA: Diagnosis not present

## 2020-03-09 DIAGNOSIS — E039 Hypothyroidism, unspecified: Secondary | ICD-10-CM | POA: Diagnosis not present

## 2020-03-09 DIAGNOSIS — J449 Chronic obstructive pulmonary disease, unspecified: Secondary | ICD-10-CM | POA: Diagnosis not present

## 2020-03-09 DIAGNOSIS — I959 Hypotension, unspecified: Secondary | ICD-10-CM | POA: Diagnosis not present

## 2020-03-09 DIAGNOSIS — I341 Nonrheumatic mitral (valve) prolapse: Secondary | ICD-10-CM | POA: Diagnosis not present

## 2020-03-09 DIAGNOSIS — R627 Adult failure to thrive: Secondary | ICD-10-CM | POA: Diagnosis not present

## 2020-03-09 DIAGNOSIS — I313 Pericardial effusion (noninflammatory): Secondary | ICD-10-CM | POA: Diagnosis not present

## 2020-03-09 DIAGNOSIS — R5381 Other malaise: Secondary | ICD-10-CM | POA: Diagnosis not present

## 2020-03-09 DIAGNOSIS — Z01812 Encounter for preprocedural laboratory examination: Secondary | ICD-10-CM | POA: Insufficient documentation

## 2020-03-09 DIAGNOSIS — Z9889 Other specified postprocedural states: Secondary | ICD-10-CM | POA: Diagnosis not present

## 2020-03-09 DIAGNOSIS — Z01810 Encounter for preprocedural cardiovascular examination: Secondary | ICD-10-CM | POA: Diagnosis not present

## 2020-03-09 DIAGNOSIS — Z20822 Contact with and (suspected) exposure to covid-19: Secondary | ICD-10-CM | POA: Insufficient documentation

## 2020-03-09 DIAGNOSIS — I517 Cardiomegaly: Secondary | ICD-10-CM | POA: Diagnosis not present

## 2020-03-09 DIAGNOSIS — M791 Myalgia, unspecified site: Secondary | ICD-10-CM | POA: Diagnosis not present

## 2020-03-09 DIAGNOSIS — Z87891 Personal history of nicotine dependence: Secondary | ICD-10-CM | POA: Diagnosis not present

## 2020-03-09 DIAGNOSIS — I4891 Unspecified atrial fibrillation: Secondary | ICD-10-CM | POA: Diagnosis not present

## 2020-03-09 DIAGNOSIS — N179 Acute kidney failure, unspecified: Secondary | ICD-10-CM | POA: Diagnosis not present

## 2020-03-09 DIAGNOSIS — I314 Cardiac tamponade: Secondary | ICD-10-CM | POA: Diagnosis not present

## 2020-03-09 DIAGNOSIS — I10 Essential (primary) hypertension: Secondary | ICD-10-CM | POA: Diagnosis not present

## 2020-03-09 DIAGNOSIS — J9811 Atelectasis: Secondary | ICD-10-CM | POA: Diagnosis not present

## 2020-03-09 HISTORY — DX: Bilateral inguinal hernia, without obstruction or gangrene, not specified as recurrent: K40.20

## 2020-03-09 HISTORY — DX: Dyspnea, unspecified: R06.00

## 2020-03-09 LAB — COMPREHENSIVE METABOLIC PANEL
ALT: 17 U/L (ref 0–44)
AST: 22 U/L (ref 15–41)
Albumin: 3.9 g/dL (ref 3.5–5.0)
Alkaline Phosphatase: 50 U/L (ref 38–126)
Anion gap: 9 (ref 5–15)
BUN: 13 mg/dL (ref 8–23)
CO2: 21 mmol/L — ABNORMAL LOW (ref 22–32)
Calcium: 9 mg/dL (ref 8.9–10.3)
Chloride: 107 mmol/L (ref 98–111)
Creatinine, Ser: 0.88 mg/dL (ref 0.61–1.24)
GFR, Estimated: >60 mL/min (ref >60)
Glucose, Bld: 92 mg/dL (ref 70–99)
Potassium: 4.3 mmol/L (ref 3.5–5.1)
Sodium: 137 mmol/L (ref 135–145)
Total Bilirubin: 0.9 mg/dL (ref 0.3–1.2)
Total Protein: 6.3 g/dL — ABNORMAL LOW (ref 6.5–8.1)

## 2020-03-09 LAB — URINALYSIS, ROUTINE W REFLEX MICROSCOPIC
Bacteria, UA: NONE SEEN
Bilirubin Urine: NEGATIVE
Glucose, UA: NEGATIVE mg/dL
Ketones, ur: NEGATIVE mg/dL
Leukocytes,Ua: NEGATIVE
Nitrite: NEGATIVE
Protein, ur: NEGATIVE mg/dL
Specific Gravity, Urine: 1.021 (ref 1.005–1.030)
pH: 5 (ref 5.0–8.0)

## 2020-03-09 LAB — SURGICAL PCR SCREEN
MRSA, PCR: NEGATIVE
Staphylococcus aureus: NEGATIVE

## 2020-03-09 LAB — CBC
HCT: 45.3 % (ref 39.0–52.0)
Hemoglobin: 15.3 g/dL (ref 13.0–17.0)
MCH: 29.9 pg (ref 26.0–34.0)
MCHC: 33.8 g/dL (ref 30.0–36.0)
MCV: 88.6 fL (ref 80.0–100.0)
Platelets: 199 10*3/uL (ref 150–400)
RBC: 5.11 MIL/uL (ref 4.22–5.81)
RDW: 13.5 % (ref 11.5–15.5)
WBC: 4.1 10*3/uL (ref 4.0–10.5)
nRBC: 0 % (ref 0.0–0.2)

## 2020-03-09 LAB — HEMOGLOBIN A1C
Hgb A1c MFr Bld: 5.4 % (ref 4.8–5.6)
Mean Plasma Glucose: 108.28 mg/dL

## 2020-03-09 LAB — BLOOD GAS, ARTERIAL
Acid-Base Excess: 0 mmol/L (ref 0.0–2.0)
Bicarbonate: 24 mmol/L (ref 20.0–28.0)
FIO2: 20
O2 Saturation: 95.1 %
Patient temperature: 37
pCO2 arterial: 37.8 mmHg (ref 32.0–48.0)
pH, Arterial: 7.418 (ref 7.350–7.450)
pO2, Arterial: 74 mmHg — ABNORMAL LOW (ref 83.0–108.0)

## 2020-03-09 LAB — SARS CORONAVIRUS 2 (TAT 6-24 HRS): SARS Coronavirus 2: NEGATIVE

## 2020-03-09 LAB — PROTIME-INR
INR: 1 (ref 0.8–1.2)
Prothrombin Time: 12.8 seconds (ref 11.4–15.2)

## 2020-03-09 LAB — APTT: aPTT: 32 seconds (ref 24–36)

## 2020-03-09 NOTE — Progress Notes (Signed)
PCP - Dr. Vonna Kotyk [place] Cardiologist - Dr. Shelva Majestic  Chest x-ray - 03/09/20 EKG - 02/21/20 Stress Test - Several years ago no f/u needed at that time ECHO - 11/25/19 Cardiac Cath - 02/21/20  Sleep Study - Yes recently not complete, but rescheduling, Diagnosed with OSA CPAP - Has machine not currently using  DM - Denies  COVID TEST- 03/09/20  Anesthesia review: Yes cardiac hx recent heart cath  Patient denies fever, cough and chest pain at PAT appointment  Shortness of breath at baseline.   All instructions explained to the patient, with a verbal understanding of the material. Patient agrees to go over the instructions while at home for a better understanding. Patient also instructed to self quarantine after being tested for COVID-19. The opportunity to ask questions was provided.

## 2020-03-09 NOTE — H&P (Addendum)
[facility]       [place] Bell [address]             [phone]          CARDIOTHORACIC SURGERY HISTORY AND PHYSICAL EXAM  Referring Provider is Troy Sine, MD PCP is Dettinger, Fransisca Kaufmann, MD  Chief Complaint  Patient presents with  . Mitral Regurgitation    new patient consultation, [place] TEE 12/19/19    HPI:  Patient is an 79 year old male with history of hypertension, obstructive sleep apnea, hyperlipidemia, GE reflux disease, and remote history of heavy tobacco use who has been referred for surgical consultation to discuss treatment options for management of recently discovered mitral valve prolapse with severe mitral regurgitation.  Patient has remained physically active and reasonably healthy all of his adult life.  He has been treated for essential hypertension and obstructive sleep apnea.  Several years ago he tried using CPAP but has stopped since then.  He reports a 6 to 33-month history of change in exercise tolerance with the development of exertional shortness of breath that typically occurs only with more strenuous physical exertion.  He has blamed this on a tendency that he has had to be less physically active.  He was recently noted to have a new prominent systolic murmur on physical exam by his primary care physician.  Transthoracic echocardiogram was performed demonstrating normal left ventricular function with mitral valve prolapse and severe mitral regurgitation.  The patient was referred for cardiology consultation and recently evaluated by Dr. Claiborne Billings.  The patient subsequently underwent TEE which confirmed the presence of mitral valve prolapse with flail segment involving a portion of the posterior leaflet and severe mitral regurgitation.  Cardiothoracic surgical consultation was requested.  Patient is married and lives locally in [place] with his wife.  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  He has remained reasonably active physically throughout his adult life and in retirement.  He does not exercise on a regular basis but he regularly performs fairly strenuous activities when he is working on his property or around the house.  He states that he has noticed a tendency to get short of breath with more strenuous physical exertion.  This is developed over the past 6 to 12 months.  He denies resting shortness of breath, PND, or orthopnea.  He does note that breathing is worse when he lays flat in bed and he has also developed a persistent dry nonproductive cough.  He has had some atypical pains across the left chest which are not necessarily related to exertion.  These pains are described as a dull mild aching sensation that seem to come and go sporadically.  He has not had any palpitations or syncope.  He has had some dizzy spells that has been attributed to changes in his blood pressure medications.  Patient returns to the office today for follow-up prior to elective mitral valve repair for mitral valve prolapse with severe primary mitral regurgitation.  He was originally seen in consultation on January 09, 2020.  Since then he underwent diagnostic cardiac catheterization by Dr. Claiborne Billings on February 21, 2020.  He reports no new problems or complaints over the past 6 weeks although he does note that he has developed what he believes is an enlarging moderate-sized pulsatile lump in his right groin since his heart catheterization.  He states that he only gets short of breath with strenuous  exertion and this has not progressed.  He has not had any significant chest pain or palpitations.  He denies any fevers, chills, or productive cough.  He has not been exposed to any persons with known or suspected COVID-19 infection.  His wife did have a recent upper respiratory tract infection but she was tested negative for COVID-19.  Both have been vaccinated.     Past Medical History:  Diagnosis Date  .  Allergy   . Asthma   . Cataract   . Cataract   . Diverticulitis   . Dyspnea   . GERD (gastroesophageal reflux disease)   . Heart murmur 12/2019  . Hyperlipidemia   . Hypertension   . Inguinal hernia bilateral, non-recurrent   . Sleep apnea     Past Surgical History:  Procedure Laterality Date  . bilateral inguinal hernia    . CARDIAC CATHETERIZATION Bilateral 02/21/2020  . COLONOSCOPY  06/09/2004   IWP:[id number] colon diverticulosis, otherwise normal colonoscopy  . EYE SURGERY  08/2014  . HERNIA REPAIR    . PALATE / UVULA BIOPSY / EXCISION    . RIGHT/LEFT HEART CATH AND CORONARY ANGIOGRAPHY N/A 02/21/2020   Procedure: RIGHT/LEFT HEART CATH AND CORONARY ANGIOGRAPHY;  Surgeon: Troy Sine, MD;  Location: [facility];  Service: Cardiovascular;  Laterality: N/A;  . TEE WITHOUT CARDIOVERSION N/A 12/19/2019   Procedure: TRANSESOPHAGEAL ECHOCARDIOGRAM (TEE);  Surgeon: Buford Dresser, MD;  Location: [facility];  Service: Cardiovascular;  Laterality: N/A;  . TONSILLECTOMY    . VASECTOMY      Family History  Problem Relation Age of Onset  . Heart disease Mother   . Hip fracture Mother 92  . Osteoporosis Mother   . Dementia Mother 60  . Arthritis Father   . Heart disease Father   . Arthritis Sister   . Arthritis Brother   . Colon cancer Neg Hx     Social History Social History   Tobacco Use  . Smoking status: Former Smoker    Packs/day: 4.00    Years: 25.00    Pack years: 100.00    Types: Cigarettes    Quit date: 06/23/1982    Years since quitting: 37.7  . Smokeless tobacco: Never Used  . 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 Vaping Use: Never used  Substance Use Topics  . Alcohol use: Yes    Alcohol/week: 5.0 standard drinks    Types: 5 Shots of liquor per week    Comment: brandy a few times a month, beer once a month  . Drug use: No    Prior to Admission medications   Medication Sig Start Date End Date Taking? Authorizing  Provider  acetaminophen (TYLENOL) 500 MG tablet Take 500 mg by mouth daily as needed (pain.).   Yes [provider]  fexofenadine-pseudoephedrine (ALLEGRA-D) 60-120 MG 12 hr tablet Take 1 tablet by mouth daily as needed (allergies).    Yes [provider]  hydroxypropyl methylcellulose / hypromellose (ISOPTO TEARS / GONIOVISC) 2.5 % ophthalmic solution Place 1-2 drops into both eyes 3 (three) times daily as needed (dry/irritated eyes.).   Yes [provider]  losartan (COZAAR) 50 MG tablet Take 1 tablet (50 mg total) by mouth daily. Patient taking differently: Take 50 mg by mouth every evening.  01/07/20 04/06/20 Yes Meng, Isaac Laud, PA  montelukast (SINGULAIR) 10 MG tablet Take 1 tablet (10 mg total) daily by mouth. Patient taking differently: Take 10 mg by mouth at bedtime.  11/14/19  Yes Dettinger, Fransisca Kaufmann, MD  Olopatadine HCl (PATADAY) 0.2 % SOLN Place 1 drop into both eyes daily as needed (allergies).    Yes [provider]  pantoprazole (PROTONIX) 40 MG tablet Take 1 tablet (40 mg total) daily by mouth. Patient taking differently: Take 40 mg by mouth at bedtime.  05/17/19  Yes Dettinger, Fransisca Kaufmann, MD  PROAIR HFA 108 [phone] Base) MCG/ACT inhaler Inhale 2 puffs into the lungs every 6 (six) hours as needed for wheezing. Patient taking differently: Inhale 2 puffs into the lungs every 6 (six) hours as needed for wheezing.  09/13/17  Yes Dettinger, Fransisca Kaufmann, MD  simvastatin (ZOCOR) 10 MG tablet Take 1 tablet (10 mg total) by mouth at bedtime. 11/14/19  Yes Dettinger, Fransisca Kaufmann, MD  azithromycin (ZITHROMAX Z-PAK) 250 MG tablet Take two right away Then one a day for the next 4 days. Patient not taking: Reported on 03/09/2020 03/06/20   Claretta Fraise, MD  benzonatate (TESSALON) 200 MG capsule Take 1 capsule (200 mg total) by mouth 3 (three) times daily as needed for cough. Patient not taking: Reported on 03/09/2020 03/06/20   Claretta Fraise, MD    No Known Allergies   Review of  Systems:              General:                      normal appetite, normal energy, no weight gain, no weight loss, no fever             Cardiac:                       no chest pain with exertion, occasional brief episodes chest pain at rest, +SOB with exertion, no resting SOB, no PND, no orthopnea, no palpitations, no arrhythmia, no atrial fibrillation, no LE edema, + dizzy spells, no syncope             Respiratory:                 exertional shortness of breath, no home oxygen, no productive cough, + persistent dry cough, no bronchitis, + wheezing, no hemoptysis, + asthma, no pain with inspiration or cough, + sleep apnea, no CPAP at night             GI:                               no difficulty swallowing, + reflux, no frequent heartburn, no hiatal hernia, no abdominal pain, no constipation, + diarrhea, no hematochezia, no hematemesis, no melena             GU:                              no dysuria,  + frequency, no urinary tract infection, no hematuria, no enlarged prostate, no kidney stones, no kidney disease             Vascular:                     no pain suggestive of claudication, no pain in feet, no leg cramps, no varicose veins, no DVT, no non-healing foot ulcer             Neuro:  no stroke, no TIA's, no seizures, no headaches, no temporary blindness one eye,  no slurred speech, no peripheral neuropathy, + chronic pain in lower back, no instability of gait, no memory/cognitive dysfunction             Musculoskeletal:         + arthritis, no joint swelling, no myalgias, no difficulty walking, normal mobility              Skin:                            no rash, no itching, no skin infections, no pressure sores or ulcerations             Psych:                         no anxiety, no depression, no nervousness, no unusual recent stress             Eyes:                           no blurry vision, no floaters, no recent vision changes, + wears glasses or contacts              ENT:                            + hearing loss, no loose or painful teeth, no dentures, last saw dentist within the past year             Hematologic:               no easy bruising, no abnormal bleeding, no clotting disorder, no frequent epistaxis             Endocrine:                   no diabetes, does not check CBG's at home             Vaccination:                + vaccine for COVID19 and recent flu vaccine                                                       Physical Exam:              BP (!) 150/89 (BP Location: Left Arm, Patient Position: Sitting, Cuff Size: Normal)   Pulse 87   Temp (!) 97.3 F (36.3 C) (Skin)   Resp 20   Ht 5\' 10"  (1.778 m)   Wt 183 lb 6.4 oz (83.2 kg)   SpO2 96% Comment: RA  BMI 26.32 kg/m              General:                        well-appearing             HEENT:                       Unremarkable  Neck:                           no JVD, no bruits, no adenopathy              Chest:                          clear to auscultation, symmetrical breath sounds, no wheezes, no rhonchi              CV:                              RRR, grade IV/VI holosystolic murmur heard best at apex,  no diastolic murmur             Abdomen:                    soft, non-tender, no masses              Extremities:                 warm, well-perfused, pulses palpable, no LE edema             Rectal/GU                   Deferred             Neuro:                         Grossly non-focal and symmetrical throughout             Skin:                            Clean and dry, no rashes, no breakdown   Diagnostic Tests:  EKG:   NSR w/ occasional PVC's and PAC's    ECHOCARDIOGRAM REPORT       Patient Name:  JERMAYNE SWEENEY Date of Exam: 11/25/2019  Medical Rec #: [id number]    Height:    69.0 in  Accession #:  [id number]   Weight:    186.0 lb  Date of Birth: 29-May-1940    BSA:     2.003 m  Patient Age:  74  years    BP:      167/78 mmHg  Patient Gender: M        HR:      80 bpm.  Exam Location: Outpatient   Procedure: 2D Echo, Cardiac Doppler and Color Doppler   Indications:  Murmur 785.2 / R01.1    History:    Patient has no prior history of Echocardiogram  examinations.         Risk Factors:Hypertension, Dyslipidemia and GERD.    Sonographer:  Bernadene Person RDCS  Referring Phys: [phone] [place]    1. Severe mitral regurgitation. Recommend TEE for further clarification.  2. Left ventricular ejection fraction, by estimation, is 60 to 65%. The  left ventricle has normal function. The left ventricle has no regional  wall motion abnormalities. Left ventricular diastolic parameters are  indeterminate.  3. Right ventricular systolic function is normal. The right ventricular  size is normal.  4. Left atrial size was mildly dilated.  5. The mitral valve is abnormal. Severe mitral valve regurgitation. No  evidence  of mitral stenosis.  6. The aortic valve is normal in structure. Aortic valve regurgitation is  not visualized. No aortic stenosis is present.  7. The inferior vena cava is normal in size with greater than 50%  respiratory variability, suggesting right atrial pressure of 3 mmHg.   FINDINGS  Left Ventricle: Left ventricular ejection fraction, by estimation, is 60  to 65%. The left ventricle has normal function. The left ventricle has no  regional wall motion abnormalities. The left ventricular internal cavity  size was normal in size. There is  no left ventricular hypertrophy. Left ventricular diastolic parameters  are indeterminate.   Right Ventricle: The right ventricular size is normal. No increase in  right ventricular wall thickness. Right ventricular systolic function is  normal.   Left Atrium: Left atrial size was mildly dilated.   Right Atrium: Right atrial size was normal in size.    Pericardium: There is no evidence of pericardial effusion.   Mitral Valve: The mitral valve is abnormal. There is moderate holosystolic  prolapse of the middle scallop of the posterior leaflet of the mitral  valve. There is mild thickening of the mitral valve leaflet(s). Normal  mobility of the mitral valve leaflets.  Moderate mitral annular calcification. Severe mitral valve regurgitation,  with eccentric anteriorly directed jet. No evidence of mitral valve  stenosis.   Tricuspid Valve: The tricuspid valve is normal in structure. Tricuspid  valve regurgitation is not demonstrated. No evidence of tricuspid  stenosis.   Aortic Valve: The aortic valve is normal in structure. Aortic valve  regurgitation is not visualized. No aortic stenosis is present.   Pulmonic Valve: The pulmonic valve was normal in structure. Pulmonic valve  regurgitation is not visualized. No evidence of pulmonic stenosis.   Aorta: The aortic root is normal in size and structure.   Venous: The inferior vena cava is normal in size with greater than 50%  respiratory variability, suggesting right atrial pressure of 3 mmHg.   IAS/Shunts: No atrial level shunt detected by color flow Doppler.   Additional Comments: Severe mitral regurgitation. Recommend TEE for  further clarification.     LEFT VENTRICLE  PLAX 2D  LVIDd:     5.60 cm Diastology  LVIDs:     3.80 cm LV e' lateral:  11.60 cm/s  LV PW:     1.00 cm LV E/e' lateral: 6.4  LV IVS:    0.90 cm LV e' medial:  8.81 cm/s  LVOT diam:   2.00 cm LV E/e' medial: 8.5  LV SV:     53  LV SV Index:  26  LVOT Area:   3.14 cm     RIGHT VENTRICLE  RV S prime:   11.60 cm/s  TAPSE (M-mode): 1.7 cm   LEFT ATRIUM      Index    RIGHT ATRIUM      Index  LA diam:   3.80 cm 1.90 cm/m RA Area:   14.10 cm  LA Vol (A2C): 41.8 ml 20.87 ml/m RA Volume:  30.20 ml 15.08 ml/m  LA Vol (A4C): 73.5 ml 36.69 ml/m   AORTIC VALVE  LVOT Vmax:  93.10 cm/s  LVOT Vmean: 63.300 cm/s  LVOT VTI:  0.168 m    AORTA  Ao Root diam: 3.50 cm  Ao Asc diam: 3.50 cm   MITRAL VALVE  MV Area (PHT): 4.21 cm  SHUNTS  MV Decel Time: 180 msec  Systemic VTI: 0.17 m  MV E velocity: 74.70 cm/s Systemic Diam: 2.00  cm  MV A velocity: 62.60 cm/s  MV E/A ratio: 1.19   Candee Furbish MD  Electronically signed by Candee Furbish MD  Signature Date/Time: 11/25/2019/5:52:10 PM        TRANSESOPHOGEAL ECHO REPORT       Patient Name:  TASHAWN LASWELL Date of Exam: 12/19/2019  Medical Rec #: [id number]    Height:    70.0 in  Accession #:  [id number]   Weight:    184.0 lb  Date of Birth: 1940-09-15    BSA:     2.015 m  Patient Age:  25 years    BP:      141/80 mmHg  Patient Gender: M        HR:      66 bpm.  Exam Location: Outpatient   Procedure: Transesophageal Echo, Cardiac Doppler, Color Doppler and 3D  Echo   Indications:   I34.0 Nonrheumatic mitral (valve) insufficiency    History:     Patient has prior history of Echocardiogram examinations,  most          recent 11/25/2019. Risk Factors:Hypertension,  Dyslipidemia,          Sleep Apnea and GERD.    Sonographer:   Jonelle Sidle Dance  Referring Phys: [id number] [place]  Diagnosing Phys: Buford Dresser MD   PROCEDURE: The transesophogeal probe was passed without difficulty through  the esophogus of the patient. Sedation performed by different physician.  The patient was monitored while under deep sedation. Anesthestetic  sedation was provided intravenously by  Anesthesiology: 207mg  of Propofol, 20mg  of Lidocaine. Image quality was  excellent. The patient's vital signs; including heart rate, blood  pressure, and oxygen saturation; remained stable throughout the procedure.  The patient developed no complications  during the procedure.   IMPRESSIONS     1. Left ventricular ejection fraction, by estimation, is 60 to 65%. The  left ventricle has normal function. The left ventricle has no regional  wall motion abnormalities.  2. Right ventricular systolic function is normal. The right ventricular  size is normal.  3. No left atrial/left atrial appendage thrombus was detected.  4. Overall normal structure except for flail portion of posterior  leaflet. No clear jet to get PISA measurements, but eccentric jet with  Coanda effect suggests severe MR. Sampled pulmonary vein had some mild  systolic flow reversal; this lack of  prominent flow reversal may be due to eccentricity of the jet. See 3D  images. Appears that P2 prolapses and P1 has partial flail leaflet.. The  mitral valve is abnormal. Severe mitral valve regurgitation. No evidence  of mitral stenosis. There is severe  prolapse of of the mitral valve.  5. The aortic valve is tricuspid. Aortic valve regurgitation is trivial.  No aortic stenosis is present.  6. There is mild (Grade II) plaque involving the descending aorta.   Conclusion(s)/Recommendation(s): Abnormalities of the posterior mitral  valve leaflet. Appears to have prolapsed P2 segment and partial flail P1  segment leading to eccentric severe MR.   FINDINGS  Left Ventricle: Left ventricular ejection fraction, by estimation, is 60  to 65%. The left ventricle has normal function. The left ventricle has no  regional wall motion abnormalities. The left ventricular internal cavity  size was normal in size.   Right Ventricle: The right ventricular size is normal. No increase in  right ventricular wall thickness. Right ventricular systolic function is  normal.   Left Atrium: Left atrial size was not assessed. No left atrial/left atrial  appendage thrombus was detected.   Right Atrium: Right atrial size was not assessed.   Pericardium: There is no evidence of pericardial effusion.   Mitral Valve: Overall normal  structure except for flail portion of  posterior leaflet. No clear jet to get PISA measurements, but eccentric  jet with Coanda effect suggests severe MR. Sampled pulmonary vein had some  mild systolic flow reversal; this lack  of prominent flow reversal may be due to eccentricity of the jet. See 3D  images. Appears that P2 prolapses and P1 has partial flail leaflet. The  mitral valve is abnormal. There is severe prolapse of of the mitral valve.  Severe mitral valve regurgitation,  with eccentric anteriorly directed jet. No evidence of mitral valve  stenosis. There is no evidence of mitral valve vegetation.   Tricuspid Valve: The tricuspid valve is normal in structure. Tricuspid  valve regurgitation is trivial. No evidence of tricuspid stenosis. There  is no evidence of tricuspid valve vegetation.   Aortic Valve: The aortic valve is tricuspid. Aortic valve regurgitation is  trivial. No aortic stenosis is present. There is no evidence of aortic  valve vegetation.   Pulmonic Valve: The pulmonic valve was grossly normal. Pulmonic valve  regurgitation is trivial. No evidence of pulmonic stenosis. There is no  evidence of pulmonic valve vegetation.   Aorta: The aortic root is normal in size and structure and the aortic root  and ascending aorta are structurally normal, with no evidence of  dilitation. There is mild (Grade II) plaque involving the descending  aorta.   IAS/Shunts: No atrial level shunt detected by color flow Doppler.     TRICUSPID VALVE  TR Peak grad:  15.4 mmHg  TR Vmax:    196.00 cm/s   Buford Dresser MD  Electronically signed by Buford Dresser MD  Signature Date/Time: 12/19/2019/5:56:25 PM      CT ANGIOGRAPHY CHEST, ABDOMEN AND PELVIS  TECHNIQUE: Multidetector CT imaging through the chest, abdomen and pelvis was performed using the standard protocol during bolus administration of intravenous contrast. Multiplanar reconstructed images  and MIPs were obtained and reviewed to evaluate the vascular anatomy.  CONTRAST: 156mL ISOVUE-300 IOPAMIDOL (ISOVUE-300) INJECTION 61%  COMPARISON: 03/02/2018 CT abdomen pelvis  FINDINGS: CTA CHEST FINDINGS  Cardiovascular: Minor thoracic aortic atherosclerosis without acute vascular finding. No aneurysm or dissection. Patent 3 vessel arch anatomy.  Visualized central pulmonary arteries are patent. No significant filling defect or pulmonary embolus.  Heart is enlarged. There is mild dilatation of the left atrium and left ventricle compatible with known severe mitral valvular disease.  Pulmonary veins all appear patent. No significant left atrial appendage abnormality or thrombus.  Innominate vein and SVC appear patent. No central veno-occlusive process.  Mediastinum/Nodes: No enlarged mediastinal, hilar, or axillary lymph nodes. Thyroid gland, trachea, and esophagus demonstrate no significant findings.  Lungs/Pleura: Minor centrilobular emphysema in the upper lobes.  Minimal bibasilar atelectasis/scarring. Minimal inferior lingula and right middle lobe atelectasis/scarring. Peripheral left upper lobe minor tree in bud opacity, images 54 through 58 series 8.  No interstitial process or edema.  Trachea and central airways are patent.  No pleural abnormality, effusion or pneumothorax.  Musculoskeletal: No acute osseous finding. Degenerative changes of the spine. Intact sternum.  Review of the MIP images confirms the above findings.  CTA ABDOMEN AND PELVIS FINDINGS  VASCULAR  Aorta: Minor abdominal aortic atherosclerosis without aneurysm, occlusive process, or dissection. No acute aortic abnormality. Normal caliber.  Celiac: Widely patent origin including its branches.  SMA: Widely  patent including its branches.  Renals: Both main renal arteries appear patent. No renal vascular occlusive process. No accessory renal vasculature.  IMA:  Remains patent off the distal aorta including its branches.  Inflow: Minor iliac atherosclerosis and tortuosity without inflow disease or occlusion. No acute iliac arterial abnormality. The common, internal and external iliac arteries all remain patent and normal in caliber.  Visualized common femoral, proximal profunda femoral, proximal superficial femoral arteries are widely patent and normal in caliber.  Veins: Dedicated venous imaging not performed.  Review of the MIP images confirms the above findings.  NON-VASCULAR  Hepatobiliary: No focal liver abnormality is seen. No gallstones, gallbladder wall thickening, or biliary dilatation.  Pancreas: Unremarkable. No pancreatic ductal dilatation or surrounding inflammatory changes.  Spleen: Normal in size without focal abnormality.  Adrenals/Urinary Tract: Adrenal glands are unremarkable. Kidneys are normal, without renal calculi, focal lesion, or hydronephrosis. Bladder is unremarkable.  Stomach/Bowel: Limited without oral contrast. No significant dilatation, ileus, obstruction pattern, free air. Normal appendix. Diverticulosis noted, most pronounced in the sigmoid region. No acute inflammatory process.  No free fluid, fluid collection, hemorrhage, hematoma, or abscess.  Lymphatic: No bulky adenopathy.  Reproductive: Remote vasectomy changes.  Other: Stable postop changes of inguinal hernia repairs. No recurrent hernia. Intact abdominal wall.  No ascites.  Musculoskeletal: Degenerative changes noted spine. Mild associated scoliosis. With no acute compression fracture.  Review of the MIP images confirms the above findings.  IMPRESSION: Intact thoracoabdominal aorta with minor atherosclerotic change. No acute aortic vascular process, occlusive disease, or dissection.  No significant acute central pulmonary embolus.  Left atrial and left ventricular chamber dilatation compatible with chronic  mitral valvular disease.  No other acute thoracic, abdominal or pelvic finding. additional incidental and postoperative findings as above.  Aortic Atherosclerosis (ICD10-I70.0).   Electronically Signed By: Patrick Bell. Shick M.D. On: 01/29/2020 11:12    RIGHT/LEFT HEART CATH AND CORONARY ANGIOGRAPHY  Conclusion  Normal right heart pressures with PA mean pressure at 20 mmHg.  Normal coronary arteries.  LVEDP: 21mmHg  RECOMMENDATION: Mr. Filbert Craze is an 79 year old gentleman who was recently found to have a cardiac murmur. He has subsequently been found to have severe mitral regurgitation in the setting of normal LV function with an EF of 60 to 65%. He is felt to have severe prolapse involving the portion of the posterior leaflet of the mitral valve with at least one ruptured primary chordae tendineae with a flail segment causing severe mitral regurgitation. Presently, right heart pressures are essentially normal with borderline increased PA systolic pressure at 31 mm. Coronary anatomy is normal. He has undergone CT angiography of his vasculature ordered by Dr. Roxy Bell. He will be following up with Dr. Roxy Bell and is tentatively scheduled for mitral valve repair surgery on March 11, 2020. Recommendations  Antiplatelet/Anticoag No indication for antiplatelet therapy at this time .  Indications  Severe mitral regurgitation [I34.0 (ICD-10-CM)]  Procedural Details  Technical Details Mr Benjaman Artman is an active 79 year old who has has a history of hypertension, hyperlipidemia, asthma as well as a history of obstructive sleep apnea. Over the past several months he began to notice mild shortness of breath with significant activity. An echo doppler demonstrated normal LV function with EF 60 - 25% with holosystolic prolapse of the posterior leaflet and severe MR. TEE was performed which confirmed the presence of mitral valve prolapse with a flail segment involving a portion  of the posterior leaflet and severe mitral regurgitation. He was evaluated by Dr. Roxy Bell  and is scheduled to undergo mitral valve repair. He has undergone extensive CT angio imaging of his vasculature. He presents now for right and left heart cardiac catheterization prior to his planned elective mitral valve repair surgery.  The patient was brought to the cardiac catheterization lab in the fasting state. The patient was premedicated with Versed 1 mg and fentanyl 25 mcg. An IV catheter had already been placed in the right brachial vein. This was exchanged for a 5 [place] glide sheath slender sheath. Right heart catheterization was performed and pressures were obtained in the RA, RV, PA, and pulmonary capillary wedge positions. O2 saturation was obtained in the pulmonary artery x2. Ultrasound guidance was used for right radial access. However, despite getting into the vessel on at least 3 occasions the vessel seemed to significantly constrict during systole and the wire was never able to be advanced above the mid forearm. As result the radial approach was aborted. The right femoral artery had been prepped and draped in sterile fashion. Ultrasound guidance was used to confirm vessel location. The patient received an additional Versed 1 mg and fentanyl 25 mcg. Right femoral artery was punctured anteriorly with 1 stick and a 5 French sheath was inserted without difficulty. The JR4 catheter was advanced to the ascending aorta. Central aortic oxygen saturation was obtained. Selective angiography into the right coronary artery was performed. The right catheter was then passed into the left ventricle for left ventricular end-diastolic pressure recording. Since the patient had previous noninvasive assessment of severe mitral regurgitation as well as vascular imaging left-ventricular graphic was not performed. The RCA catheter was removed and a 5 [place] JL4 left coronary catheter was inserted. The right  heart catheter was removed. Selective angiography in the left coronary system was performed without difficulty. All catheters were removed from the patient. Hemostasis was obtained by direct manual pressure. The patient left the catheterization laboratory in stable condition.   Estimated blood loss <50 mL.   During this procedure medications were administered to achieve and maintain moderate conscious sedation while the patient's heart rate, blood pressure, and oxygen saturation were continuously monitored and I was present face-to-face 100% of this time.  Medications (Filter: Administrations occurring from 0715 to 0856 on 02/21/20) (important) Continuous medications are totaled by the amount administered until 02/21/20 0856.  Heparin (Porcine) in NaCl 1000-0.9 UT/500ML-% SOLN (mL) Total volume:  500 mL Date/Time  Rate/Dose/Volume Action  02/21/20 0744  500 mL Given    Heparin (Porcine) in NaCl 1000-0.9 UT/500ML-% SOLN (mL) Total volume:  500 mL Date/Time  Rate/Dose/Volume Action  02/21/20 0745  500 mL Given    fentaNYL (SUBLIMAZE) injection (mcg) Total dose:  50 mcg Date/Time  Rate/Dose/Volume Action  02/21/20 0748  25 mcg Given  0826  25 mcg Given    midazolam (VERSED) injection (mg) Total dose:  2 mg Date/Time  Rate/Dose/Volume Action  02/21/20 0748  1 mg Given  0819  1 mg Given    lidocaine (PF) (XYLOCAINE) 1 % injection (mL) Total volume:  24 mL Date/Time  Rate/Dose/Volume Action  02/21/20 0751  2 mL Given  0752  2 mL Given  0823  20 mL Given    Sedation Time  Sedation Time Physician-1: 49 minutes 26 seconds  Radiation/Fluoro  Fluoro time: 6.2 (min) DAP: 29213 (mGycm2) Cumulative Air Kerma: 492 (mGy)  Coronary Findings  Diagnostic Dominance: Right No diagnostic findings have been documented. Intervention  No interventions have been documented. Right Heart  Right Heart Pressures RA: A-wave 7,  V wave 5; mean 4 RV: 29/7 PA: 31/11;  mean 20 PW: A-wave 15, V wave 16-20; mean 13 Initial AO: 148/78  Pullback: LV: 148/16 AO: 149/79  Oxygen saturation in the central aorta 98% and in the pulmonary artery 73%.  By the Fick method, cardiac output 5.2 L/min with a cardiac index of 2.6 L/min/m  PVR: 1.4 WU  Coronary Diagrams  Diagnostic Dominance: Right  Intervention  Implants   No implant documentation for this case.  Syngo Images  Show images for CARDIAC CATHETERIZATION Images on Long Term Storage  Show images for Adden, Strout to Procedure Log  Procedure Log    Hemo Data   Most Recent Value  Fick Cardiac Output 5.2 L/min  Fick Cardiac Output Index 2.57 (L/min)/BSA  Aortic Mean Gradient 10.8 mmHg  Aortic Peak Gradient 0 mmHg  Aortic Valve Area >3.50  Aortic Value Area Index 1.73 cm2/BSA  RA A Wave 7 mmHg  RA V Wave 5 mmHg  RA Mean 4 mmHg  RV Systolic Pressure 29 mmHg  RV Diastolic Pressure 0 mmHg  RV EDP 7 mmHg  PA Systolic Pressure 30 mmHg  PA Diastolic Pressure 10 mmHg  PA Mean 18 mmHg  PW A Wave 15 mmHg  PW V Wave 16 mmHg  PW Mean 13 mmHg  AO Systolic Pressure 161 mmHg  AO Diastolic Pressure 78 mmHg  AO Mean 096 mmHg  LV Systolic Pressure 045 mmHg  LV Diastolic Pressure 1 mmHg  LV EDP 16 mmHg  AOp Systolic Pressure 409 mmHg  AOp Diastolic Pressure 79 mmHg  AOp Mean Pressure 811 mmHg  LVp Systolic Pressure 914 mmHg  LVp Diastolic Pressure 1 mmHg  LVp EDP Pressure 16 mmHg  QP/QS 1  TPVR Index 7.78 HRUI  TSVR Index 41.96 HRUI  PVR SVR Ratio 0.07  TPVR/TSVR Ratio 0.19      Impression:  Patient has mitral valve prolapse with at least stage C and probably early stage D severe symptomatic primary mitral regurgitation.  He describes stable symptoms of exertional shortness of breath that occur only with strenuous physical exertion, consistent with chronic diastolic congestive heart failure, New York Heart Association functional class I.  He also has developed a  small enlarging pulsatile mass in the right groin since his recent diagnostic cardiac catheterization.  I have personally reviewed the patient's transthoracic and transesophageal echocardiograms, CT angiograms, and diagnostic cardiac catheterization.  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 There is at least one ruptured primary chordae tendinaewith a flail segment causing severe mitral regurgitation. The jet of regurgitation is eccentric and directed around the anterior portion of the left atrium. Left ventricular size and function appears normal. No other significant abnormalities are noted.  CT angiography reveals no contraindications to peripheral cannulation for surgery.  Diagnostic cardiac catheterization reveals normal coronary artery anatomy with no significant coronary artery disease and normal right heart pressures.  I agree the patient would likely benefit from elective mitral valve repair. Based upon review of the patient's TEE I feel there is greater than 98% likelihood of successful and durable valve repair with anticipated risk of surgery less than 1%. The patient appears to be a good candidate for minimally invasive approach for surgery.   Plan:  The patient and his wife were again counseled at length regarding the indications, risks and potential benefits of mitral valve repair.  The rationale for elective  surgery has been explained, including a comparison between surgery and continued medical therapy with close follow-up.  The likelihood of successful and durable mitral valve repair has been discussed with particular reference to the findings of their recent echocardiogram.  Based upon these findings and previous experience, I have quoted them a greater than 98 percent likelihood of successful valve repair with less than 1 percent risk of mortality  or major morbidity.  Alternative surgical approaches have been discussed including a comparison between conventional sternotomy and minimally-invasive techniques.  The relative risks and benefits of each have been reviewed as they pertain to the patient's specific circumstances, and expectations for the patient's postoperative convalescence has been discussed.  The patient desires to proceed with elective aortic valve repair via right minithoracotomy approach as previously scheduled on March 11, 2020.  Prior to surgery the patient will need to undergo right groin ultrasound to evaluate whether or not he may have false aneurysm from his recent diagnostic cardiac catheterization.  The patient understands and accepts all potential risks of surgery including but not limited to risk of death, stroke or other neurologic complication, myocardial infarction, congestive heart failure, respiratory failure, renal failure, bleeding requiring transfusion and/or reexploration, arrhythmia, infection or other wound complications, pneumonia, pleural and/or pericardial effusion, pulmonary embolus, aortic dissection or other major vascular complication, or delayed complications related to valve repair or replacement including but not limited to structural valve deterioration and failure, thrombosis, embolization, endocarditis, or paravalvular leak.  Specific risks potentially related to the minimally-invasive approach were discussed at length, including but not limited to risk of conversion to full or partial sternotomy, aortic dissection or other major vascular complication, unilateral acute lung injury or pulmonary edema, phrenic nerve dysfunction or paralysis, rib fracture, chronic pain, lung hernia, or lymphocele. All of their questions have been answered.      Valentina Gu. Patrick Manns, MD 03/02/2020 11:52 AM     ARTERIAL PSEUDOANEURYSM     Exam: Right groin  Indications: Patient complains of groin pain and  palpable knot.  History: S/p catheterization.  Comparison Study: No prior study   Performing Technologist: Maudry Mayhew MHA, RDMS, RVT, RDCS     Examination Guidelines: A complete evaluation includes B-mode imaging,  spectral  Doppler, color Doppler, and power Doppler as needed of all accessible  portions  of each vessel. Bilateral testing is considered an integral part of a  complete  examination. Limited examinations for reoccurring indications may be  performed  as noted.   +------------+----------+---------+------+----------+  Right DuplexPSV (cm/s)Waveform PlaqueComment(s)  +------------+----------+---------+------+----------+  CFA       111  triphasic          +------------+----------+---------+------+----------+  PFA       48  triphasic          +------------+----------+---------+------+----------+  Prox SFA    117  triphasic          +------------+----------+---------+------+----------+   Right Vein comments:CFV patent     Findings:   No evidence of pseudoaneurysm, AVF, or DVT involving the right groin.  Lymph node noted in the right groin measuring 2.2cm.     Diagnosing physician: Deitra Mayo MD  Electronically signed by Deitra Mayo MD on 03/03/2020 at 2:08:37  PM.

## 2020-03-10 MED ORDER — GLUTARALDEHYDE 0.625% SOAKING SOLUTION
TOPICAL | Status: DC
  Filled 2020-03-10: qty 50

## 2020-03-10 MED ORDER — POTASSIUM CHLORIDE 2 MEQ/ML IV SOLN
80.0000 meq | INTRAVENOUS | Status: DC
  Filled 2020-03-10: qty 40

## 2020-03-10 MED ORDER — PLASMA-LYTE 148 IV SOLN
INTRAVENOUS | Status: DC
  Filled 2020-03-10: qty 2.5

## 2020-03-10 MED ORDER — NOREPINEPHRINE 4 MG/250ML-% IV SOLN
0.0000 ug/min | INTRAVENOUS | Status: AC
  Administered 2020-03-11: 5 ug/min via INTRAVENOUS
  Filled 2020-03-10: qty 250

## 2020-03-10 MED ORDER — DEXMEDETOMIDINE HCL IN NACL 400 MCG/100ML IV SOLN
0.1000 ug/kg/h | INTRAVENOUS | Status: DC
  Filled 2020-03-10: qty 100

## 2020-03-10 MED ORDER — VANCOMYCIN HCL 1500 MG/300ML IV SOLN
1500.0000 mg | INTRAVENOUS | Status: AC
  Administered 2020-03-11: 1500 mg via INTRAVENOUS
  Filled 2020-03-10: qty 300

## 2020-03-10 MED ORDER — INSULIN REGULAR(HUMAN) IN NACL 100-0.9 UT/100ML-% IV SOLN
INTRAVENOUS | Status: DC
  Filled 2020-03-10: qty 100

## 2020-03-10 MED ORDER — EPINEPHRINE HCL 5 MG/250ML IV SOLN IN NS
0.0000 ug/min | INTRAVENOUS | Status: DC
  Filled 2020-03-10: qty 250

## 2020-03-10 MED ORDER — MANNITOL 20 % IV SOLN
Freq: Once | INTRAVENOUS | Status: DC
  Filled 2020-03-10: qty 13

## 2020-03-10 MED ORDER — VANCOMYCIN HCL 1000 MG IV SOLR
INTRAVENOUS | Status: DC
  Filled 2020-03-10: qty 1000

## 2020-03-10 MED ORDER — MILRINONE LACTATE IN DEXTROSE 20-5 MG/100ML-% IV SOLN
0.3000 ug/kg/min | INTRAVENOUS | Status: AC
  Administered 2020-03-11: .25 ug/kg/min via INTRAVENOUS
  Filled 2020-03-10: qty 100

## 2020-03-10 MED ORDER — TRANEXAMIC ACID 1000 MG/10ML IV SOLN
1.5000 mg/kg/h | INTRAVENOUS | Status: AC
  Administered 2020-03-11: 1.5 mg/kg/h via INTRAVENOUS
  Filled 2020-03-10: qty 25

## 2020-03-10 MED ORDER — SODIUM CHLORIDE 0.9 % IV SOLN
750.0000 mg | INTRAVENOUS | Status: DC
  Filled 2020-03-10: qty 750

## 2020-03-10 MED ORDER — SODIUM CHLORIDE 0.9 % IV SOLN
INTRAVENOUS | Status: DC
  Filled 2020-03-10: qty 30

## 2020-03-10 MED ORDER — TRANEXAMIC ACID (OHS) BOLUS VIA INFUSION
15.0000 mg/kg | INTRAVENOUS | Status: AC
  Administered 2020-03-11: 1261.5 mg via INTRAVENOUS
  Filled 2020-03-10: qty 1262

## 2020-03-10 MED ORDER — TRANEXAMIC ACID (OHS) PUMP PRIME SOLUTION
2.0000 mg/kg | INTRAVENOUS | Status: DC
  Filled 2020-03-10: qty 1.68

## 2020-03-10 MED ORDER — SODIUM CHLORIDE 0.9 % IV SOLN
1.5000 g | INTRAVENOUS | Status: DC
  Filled 2020-03-10: qty 1.5

## 2020-03-10 MED ORDER — PHENYLEPHRINE HCL-NACL 20-0.9 MG/250ML-% IV SOLN
30.0000 ug/min | INTRAVENOUS | Status: DC
  Filled 2020-03-10: qty 250

## 2020-03-10 MED ORDER — VANCOMYCIN HCL 1250 MG/250ML IV SOLN
1250.0000 mg | INTRAVENOUS | Status: DC
  Filled 2020-03-10: qty 250

## 2020-03-10 MED ORDER — NITROGLYCERIN IN D5W 200-5 MCG/ML-% IV SOLN
2.0000 ug/min | INTRAVENOUS | Status: DC
  Filled 2020-03-10: qty 250

## 2020-03-10 NOTE — Anesthesia Preprocedure Evaluation (Addendum)
Anesthesia Evaluation  Patient identified by MRN, date of birth, ID band Patient awake    Reviewed: Allergy & Precautions, NPO status , Patient's Chart, lab work & pertinent test results  History of Anesthesia Complications Negative for: history of anesthetic complications  Airway Mallampati: I  TM Distance: >3 FB Neck ROM: Full    Dental  (+) Caps, Dental Advisory Given   Pulmonary sleep apnea , COPD, former smoker,  03/09/2020 SARS coronavirus NEG   breath sounds clear to auscultation       Cardiovascular hypertension, Pt. on medications (-) angina(-) CAD + Valvular Problems/Murmurs (severe MR)  Rhythm:Regular Rate:Normal + Systolic murmurs 09/4156 ECHO: normal LV function with an EF of 60 to 65%. severe prolapse involving the portion of the posterior leaflet of the mitral valve with at least one ruptured primary chordae tendineae with a flail segment causing severe mitral regurgitation, P2 prolapses and P1 has partial flail leaflet   Neuro/Psych negative neurological ROS     GI/Hepatic Neg liver ROS, GERD  Medicated and Controlled,  Endo/Other  negative endocrine ROS  Renal/GU negative Renal ROS     Musculoskeletal   Abdominal   Peds  Hematology negative hematology ROS (+)   Anesthesia Other Findings   Reproductive/Obstetrics                            Anesthesia Physical Anesthesia Plan  ASA: III  Anesthesia Plan: General   Post-op Pain Management:    Induction: Intravenous  PONV Risk Score and Plan: Treatment may vary due to age or medical condition, Ondansetron and Dexamethasone  Airway Management Planned: Oral ETT and Double Lumen EBT  Additional Equipment: Arterial line, CVP, PA Cath, Ultrasound Guidance Line Placement and 3D TEE  Intra-op Plan:   Post-operative Plan: Possible Post-op intubation/ventilation  Informed Consent: I have reviewed the patients History and  Physical, chart, labs and discussed the procedure including the risks, benefits and alternatives for the proposed anesthesia with the patient or authorized representative who has indicated his/her understanding and acceptance.     Dental advisory given  Plan Discussed with: CRNA and Surgeon  Anesthesia Plan Comments:        Anesthesia Quick Evaluation

## 2020-03-11 ENCOUNTER — Inpatient Hospital Stay (HOSPITAL_COMMUNITY): Payer: Medicare Other | Admitting: Physician Assistant

## 2020-03-11 ENCOUNTER — Inpatient Hospital Stay (HOSPITAL_COMMUNITY): Payer: Medicare Other

## 2020-03-11 ENCOUNTER — Encounter (HOSPITAL_COMMUNITY)
Admission: RE | Disposition: A | Discharge status: Rehabilitation Facility | Payer: Self-pay | Source: Home / Self Care | Attending: Thoracic Surgery (Cardiothoracic Vascular Surgery)

## 2020-03-11 ENCOUNTER — Other Ambulatory Visit: Payer: Self-pay

## 2020-03-11 ENCOUNTER — Ambulatory Visit (HOSPITAL_COMMUNITY): Payer: Medicare Other | Attending: Thoracic Surgery (Cardiothoracic Vascular Surgery)

## 2020-03-11 ENCOUNTER — Inpatient Hospital Stay (HOSPITAL_COMMUNITY)
Admission: RE | Admit: 2020-03-11 | Discharge: 2020-03-31 | DRG: 219 | Disposition: A | Discharge status: Rehabilitation Facility | Payer: Medicare Other | Attending: Thoracic Surgery (Cardiothoracic Vascular Surgery) | Admitting: Thoracic Surgery (Cardiothoracic Vascular Surgery)

## 2020-03-11 ENCOUNTER — Inpatient Hospital Stay (HOSPITAL_COMMUNITY): Payer: Medicare Other | Admitting: Anesthesiology

## 2020-03-11 ENCOUNTER — Encounter (HOSPITAL_COMMUNITY): Payer: Self-pay | Admitting: Thoracic Surgery (Cardiothoracic Vascular Surgery)

## 2020-03-11 DIAGNOSIS — E8809 Other disorders of plasma-protein metabolism, not elsewhere classified: Secondary | ICD-10-CM | POA: Diagnosis not present

## 2020-03-11 DIAGNOSIS — I130 Hypertensive heart and chronic kidney disease with heart failure and stage 1 through stage 4 chronic kidney disease, or unspecified chronic kidney disease: Secondary | ICD-10-CM | POA: Diagnosis present

## 2020-03-11 DIAGNOSIS — E039 Hypothyroidism, unspecified: Secondary | ICD-10-CM | POA: Diagnosis not present

## 2020-03-11 DIAGNOSIS — Z8262 Family history of osteoporosis: Secondary | ICD-10-CM

## 2020-03-11 DIAGNOSIS — I71019 Dissection of thoracic aorta, unspecified: Secondary | ICD-10-CM | POA: Diagnosis not present

## 2020-03-11 DIAGNOSIS — I959 Hypotension, unspecified: Secondary | ICD-10-CM | POA: Diagnosis not present

## 2020-03-11 DIAGNOSIS — R110 Nausea: Secondary | ICD-10-CM | POA: Diagnosis not present

## 2020-03-11 DIAGNOSIS — N170 Acute kidney failure with tubular necrosis: Secondary | ICD-10-CM | POA: Diagnosis not present

## 2020-03-11 DIAGNOSIS — I4891 Unspecified atrial fibrillation: Secondary | ICD-10-CM | POA: Diagnosis not present

## 2020-03-11 DIAGNOSIS — J80 Acute respiratory distress syndrome: Secondary | ICD-10-CM | POA: Diagnosis not present

## 2020-03-11 DIAGNOSIS — Z8249 Family history of ischemic heart disease and other diseases of the circulatory system: Secondary | ICD-10-CM

## 2020-03-11 DIAGNOSIS — E876 Hypokalemia: Secondary | ICD-10-CM | POA: Diagnosis not present

## 2020-03-11 DIAGNOSIS — G4733 Obstructive sleep apnea (adult) (pediatric): Secondary | ICD-10-CM | POA: Diagnosis present

## 2020-03-11 DIAGNOSIS — I9589 Other hypotension: Secondary | ICD-10-CM | POA: Diagnosis not present

## 2020-03-11 DIAGNOSIS — I10 Essential (primary) hypertension: Secondary | ICD-10-CM | POA: Diagnosis not present

## 2020-03-11 DIAGNOSIS — D62 Acute posthemorrhagic anemia: Secondary | ICD-10-CM | POA: Diagnosis not present

## 2020-03-11 DIAGNOSIS — R609 Edema, unspecified: Secondary | ICD-10-CM | POA: Diagnosis not present

## 2020-03-11 DIAGNOSIS — R0602 Shortness of breath: Secondary | ICD-10-CM | POA: Diagnosis not present

## 2020-03-11 DIAGNOSIS — K219 Gastro-esophageal reflux disease without esophagitis: Secondary | ICD-10-CM | POA: Diagnosis present

## 2020-03-11 DIAGNOSIS — I3139 Other pericardial effusion (noninflammatory): Secondary | ICD-10-CM

## 2020-03-11 DIAGNOSIS — N189 Chronic kidney disease, unspecified: Secondary | ICD-10-CM | POA: Diagnosis present

## 2020-03-11 DIAGNOSIS — R030 Elevated blood-pressure reading, without diagnosis of hypertension: Secondary | ICD-10-CM | POA: Diagnosis not present

## 2020-03-11 DIAGNOSIS — J90 Pleural effusion, not elsewhere classified: Secondary | ICD-10-CM | POA: Diagnosis not present

## 2020-03-11 DIAGNOSIS — I313 Pericardial effusion (noninflammatory): Secondary | ICD-10-CM | POA: Diagnosis not present

## 2020-03-11 DIAGNOSIS — I340 Nonrheumatic mitral (valve) insufficiency: Principal | ICD-10-CM

## 2020-03-11 DIAGNOSIS — N400 Enlarged prostate without lower urinary tract symptoms: Secondary | ICD-10-CM | POA: Diagnosis present

## 2020-03-11 DIAGNOSIS — I341 Nonrheumatic mitral (valve) prolapse: Secondary | ICD-10-CM | POA: Diagnosis present

## 2020-03-11 DIAGNOSIS — I4819 Other persistent atrial fibrillation: Secondary | ICD-10-CM | POA: Diagnosis not present

## 2020-03-11 DIAGNOSIS — Z79899 Other long term (current) drug therapy: Secondary | ICD-10-CM | POA: Diagnosis not present

## 2020-03-11 DIAGNOSIS — I5033 Acute on chronic diastolic (congestive) heart failure: Secondary | ICD-10-CM | POA: Diagnosis not present

## 2020-03-11 DIAGNOSIS — I314 Cardiac tamponade: Secondary | ICD-10-CM | POA: Diagnosis not present

## 2020-03-11 DIAGNOSIS — E785 Hyperlipidemia, unspecified: Secondary | ICD-10-CM | POA: Diagnosis present

## 2020-03-11 DIAGNOSIS — J9811 Atelectasis: Secondary | ICD-10-CM

## 2020-03-11 DIAGNOSIS — N179 Acute kidney failure, unspecified: Secondary | ICD-10-CM | POA: Diagnosis not present

## 2020-03-11 DIAGNOSIS — E46 Unspecified protein-calorie malnutrition: Secondary | ICD-10-CM | POA: Diagnosis not present

## 2020-03-11 DIAGNOSIS — Z20822 Contact with and (suspected) exposure to covid-19: Secondary | ICD-10-CM | POA: Diagnosis present

## 2020-03-11 DIAGNOSIS — J45909 Unspecified asthma, uncomplicated: Secondary | ICD-10-CM | POA: Diagnosis present

## 2020-03-11 DIAGNOSIS — I951 Orthostatic hypotension: Secondary | ICD-10-CM | POA: Diagnosis not present

## 2020-03-11 DIAGNOSIS — J449 Chronic obstructive pulmonary disease, unspecified: Secondary | ICD-10-CM | POA: Diagnosis not present

## 2020-03-11 DIAGNOSIS — Z9889 Other specified postprocedural states: Secondary | ICD-10-CM

## 2020-03-11 DIAGNOSIS — E7800 Pure hypercholesterolemia, unspecified: Secondary | ICD-10-CM | POA: Diagnosis not present

## 2020-03-11 DIAGNOSIS — Z87891 Personal history of nicotine dependence: Secondary | ICD-10-CM

## 2020-03-11 DIAGNOSIS — I517 Cardiomegaly: Secondary | ICD-10-CM | POA: Diagnosis not present

## 2020-03-11 DIAGNOSIS — I7101 Dissection of thoracic aorta: Secondary | ICD-10-CM | POA: Diagnosis not present

## 2020-03-11 DIAGNOSIS — R627 Adult failure to thrive: Secondary | ICD-10-CM | POA: Diagnosis not present

## 2020-03-11 DIAGNOSIS — I7100 Dissection of unspecified site of aorta: Secondary | ICD-10-CM | POA: Diagnosis not present

## 2020-03-11 DIAGNOSIS — Z9689 Presence of other specified functional implants: Secondary | ICD-10-CM

## 2020-03-11 DIAGNOSIS — M791 Myalgia, unspecified site: Secondary | ICD-10-CM | POA: Diagnosis not present

## 2020-03-11 DIAGNOSIS — I083 Combined rheumatic disorders of mitral, aortic and tricuspid valves: Secondary | ICD-10-CM | POA: Diagnosis not present

## 2020-03-11 DIAGNOSIS — R791 Abnormal coagulation profile: Secondary | ICD-10-CM | POA: Diagnosis not present

## 2020-03-11 DIAGNOSIS — R5381 Other malaise: Secondary | ICD-10-CM | POA: Diagnosis not present

## 2020-03-11 DIAGNOSIS — Z8261 Family history of arthritis: Secondary | ICD-10-CM

## 2020-03-11 DIAGNOSIS — I7102 Dissection of abdominal aorta: Secondary | ICD-10-CM

## 2020-03-11 DIAGNOSIS — Z4682 Encounter for fitting and adjustment of non-vascular catheter: Secondary | ICD-10-CM

## 2020-03-11 HISTORY — PX: MITRAL VALVE REPAIR: SHX2039

## 2020-03-11 HISTORY — PX: TEE WITHOUT CARDIOVERSION: SHX5443

## 2020-03-11 HISTORY — PX: REPAIR OF ACUTE ASCENDING THORACIC AORTIC DISSECTION: SHX6323

## 2020-03-11 HISTORY — DX: Dissection of thoracic aorta, unspecified: I71.019

## 2020-03-11 HISTORY — DX: Other specified postprocedural states: Z98.890

## 2020-03-11 HISTORY — DX: Dissection of thoracic aorta: I71.01

## 2020-03-11 LAB — POCT I-STAT 7, (LYTES, BLD GAS, ICA,H+H)
Acid-Base Excess: 2 mmol/L (ref 0.0–2.0)
Acid-Base Excess: 3 mmol/L — ABNORMAL HIGH (ref 0.0–2.0)
Acid-base deficit: 1 mmol/L (ref 0.0–2.0)
Acid-base deficit: 2 mmol/L (ref 0.0–2.0)
Acid-base deficit: 8 mmol/L — ABNORMAL HIGH (ref 0.0–2.0)
Acid-base deficit: 1 mmol/L (ref 0.0–2.0)
Acid-base deficit: 5 mmol/L — ABNORMAL HIGH (ref 0.0–2.0)
Acid-base deficit: 4 mmol/L — ABNORMAL HIGH (ref 0.0–2.0)
Acid-base deficit: 2 mmol/L (ref 0.0–2.0)
Acid-base deficit: 3 mmol/L — ABNORMAL HIGH (ref 0.0–2.0)
Bicarbonate: 25.5 mmol/L (ref 20.0–28.0)
Bicarbonate: 18.8 mmol/L — ABNORMAL LOW (ref 20.0–28.0)
Bicarbonate: 23.3 mmol/L (ref 20.0–28.0)
Bicarbonate: 23.9 mmol/L (ref 20.0–28.0)
Bicarbonate: 22.1 mmol/L (ref 20.0–28.0)
Bicarbonate: 20.6 mmol/L (ref 20.0–28.0)
Bicarbonate: 23.6 mmol/L (ref 20.0–28.0)
Bicarbonate: 22.3 mmol/L (ref 20.0–28.0)
Bicarbonate: 26.3 mmol/L (ref 20.0–28.0)
Bicarbonate: 27.5 mmol/L (ref 20.0–28.0)
Calcium, Ion: 1.28 mmol/L (ref 1.15–1.40)
Calcium, Ion: 0.67 mmol/L — CL (ref 1.15–1.40)
Calcium, Ion: 0.84 mmol/L — CL (ref 1.15–1.40)
Calcium, Ion: 0.65 mmol/L — CL (ref 1.15–1.40)
Calcium, Ion: 0.66 mmol/L — CL (ref 1.15–1.40)
Calcium, Ion: 0.62 mmol/L — CL (ref 1.15–1.40)
Calcium, Ion: 0.63 mmol/L — CL (ref 1.15–1.40)
Calcium, Ion: 0.65 mmol/L — CL (ref 1.15–1.40)
Calcium, Ion: 0.76 mmol/L — CL (ref 1.15–1.40)
Calcium, Ion: 0.9 mmol/L — ABNORMAL LOW (ref 1.15–1.40)
HCT: 39 % (ref 39.0–52.0)
HCT: 20 % — ABNORMAL LOW (ref 39.0–52.0)
HCT: 23 % — ABNORMAL LOW (ref 39.0–52.0)
HCT: 27 % — ABNORMAL LOW (ref 39.0–52.0)
HCT: 25 % — ABNORMAL LOW (ref 39.0–52.0)
HCT: 26 % — ABNORMAL LOW (ref 39.0–52.0)
HCT: 25 % — ABNORMAL LOW (ref 39.0–52.0)
HCT: 28 % — ABNORMAL LOW (ref 39.0–52.0)
HCT: 26 % — ABNORMAL LOW (ref 39.0–52.0)
HCT: 29 % — ABNORMAL LOW (ref 39.0–52.0)
Hemoglobin: 13.3 g/dL (ref 13.0–17.0)
Hemoglobin: 6.8 g/dL — CL (ref 13.0–17.0)
Hemoglobin: 7.8 g/dL — ABNORMAL LOW (ref 13.0–17.0)
Hemoglobin: 9.2 g/dL — ABNORMAL LOW (ref 13.0–17.0)
Hemoglobin: 8.5 g/dL — ABNORMAL LOW (ref 13.0–17.0)
Hemoglobin: 8.8 g/dL — ABNORMAL LOW (ref 13.0–17.0)
Hemoglobin: 8.5 g/dL — ABNORMAL LOW (ref 13.0–17.0)
Hemoglobin: 9.5 g/dL — ABNORMAL LOW (ref 13.0–17.0)
Hemoglobin: 8.8 g/dL — ABNORMAL LOW (ref 13.0–17.0)
Hemoglobin: 9.9 g/dL — ABNORMAL LOW (ref 13.0–17.0)
O2 Saturation: 100 %
O2 Saturation: 100 %
O2 Saturation: 100 %
O2 Saturation: 100 %
O2 Saturation: 100 %
O2 Saturation: 100 %
O2 Saturation: 100 %
O2 Saturation: 90 %
O2 Saturation: 99 %
O2 Saturation: 99 %
Patient temperature: 35
Potassium: 4.2 mmol/L (ref 3.5–5.1)
Potassium: 4.4 mmol/L (ref 3.5–5.1)
Potassium: 4.8 mmol/L (ref 3.5–5.1)
Potassium: 3.4 mmol/L — ABNORMAL LOW (ref 3.5–5.1)
Potassium: 3.5 mmol/L (ref 3.5–5.1)
Potassium: 3.7 mmol/L (ref 3.5–5.1)
Potassium: 4 mmol/L (ref 3.5–5.1)
Potassium: 3.6 mmol/L (ref 3.5–5.1)
Potassium: 3.6 mmol/L (ref 3.5–5.1)
Potassium: 3.3 mmol/L — ABNORMAL LOW (ref 3.5–5.1)
Sodium: 141 mmol/L (ref 135–145)
Sodium: 138 mmol/L (ref 135–145)
Sodium: 139 mmol/L (ref 135–145)
Sodium: 141 mmol/L (ref 135–145)
Sodium: 140 mmol/L (ref 135–145)
Sodium: 143 mmol/L (ref 135–145)
Sodium: 143 mmol/L (ref 135–145)
Sodium: 145 mmol/L (ref 135–145)
Sodium: 145 mmol/L (ref 135–145)
Sodium: 144 mmol/L (ref 135–145)
TCO2: 27 mmol/L (ref 22–32)
TCO2: 20 mmol/L — ABNORMAL LOW (ref 22–32)
TCO2: 25 mmol/L (ref 22–32)
TCO2: 25 mmol/L (ref 22–32)
TCO2: 24 mmol/L (ref 22–32)
TCO2: 22 mmol/L (ref 22–32)
TCO2: 25 mmol/L (ref 22–32)
TCO2: 23 mmol/L (ref 22–32)
TCO2: 27 mmol/L (ref 22–32)
TCO2: 29 mmol/L (ref 22–32)
pCO2 arterial: 47 mmHg (ref 32.0–48.0)
pCO2 arterial: 41.1 mmHg (ref 32.0–48.0)
pCO2 arterial: 42.3 mmHg (ref 32.0–48.0)
pCO2 arterial: 42.1 mmHg (ref 32.0–48.0)
pCO2 arterial: 48.9 mmHg — ABNORMAL HIGH (ref 32.0–48.0)
pCO2 arterial: 32.5 mmHg (ref 32.0–48.0)
pCO2 arterial: 41.9 mmHg (ref 32.0–48.0)
pCO2 arterial: 38.3 mmHg (ref 32.0–48.0)
pCO2 arterial: 38.2 mmHg (ref 32.0–48.0)
pCO2 arterial: 38.9 mmHg (ref 32.0–48.0)
pH, Arterial: 7.343 — ABNORMAL LOW (ref 7.350–7.450)
pH, Arterial: 7.256 — ABNORMAL LOW (ref 7.350–7.450)
pH, Arterial: 7.361 (ref 7.350–7.450)
pH, Arterial: 7.361 (ref 7.350–7.450)
pH, Arterial: 7.262 — ABNORMAL LOW (ref 7.350–7.450)
pH, Arterial: 7.411 (ref 7.350–7.450)
pH, Arterial: 7.359 (ref 7.350–7.450)
pH, Arterial: 7.373 (ref 7.350–7.450)
pH, Arterial: 7.446 (ref 7.350–7.450)
pH, Arterial: 7.448 (ref 7.350–7.450)
pO2, Arterial: 298 mmHg — ABNORMAL HIGH (ref 83.0–108.0)
pO2, Arterial: 354 mmHg — ABNORMAL HIGH (ref 83.0–108.0)
pO2, Arterial: 425 mmHg — ABNORMAL HIGH (ref 83.0–108.0)
pO2, Arterial: 381 mmHg — ABNORMAL HIGH (ref 83.0–108.0)
pO2, Arterial: 345 mmHg — ABNORMAL HIGH (ref 83.0–108.0)
pO2, Arterial: 246 mmHg — ABNORMAL HIGH (ref 83.0–108.0)
pO2, Arterial: 303 mmHg — ABNORMAL HIGH (ref 83.0–108.0)
pO2, Arterial: 59 mmHg — ABNORMAL LOW (ref 83.0–108.0)
pO2, Arterial: 125 mmHg — ABNORMAL HIGH (ref 83.0–108.0)
pO2, Arterial: 122 mmHg — ABNORMAL HIGH (ref 83.0–108.0)

## 2020-03-11 LAB — POCT I-STAT, CHEM 8
BUN: 17 mg/dL (ref 8–23)
BUN: 15 mg/dL (ref 8–23)
BUN: 15 mg/dL (ref 8–23)
BUN: 14 mg/dL (ref 8–23)
BUN: 14 mg/dL (ref 8–23)
BUN: 14 mg/dL (ref 8–23)
BUN: 14 mg/dL (ref 8–23)
BUN: 14 mg/dL (ref 8–23)
BUN: 13 mg/dL (ref 8–23)
Calcium, Ion: 1.27 mmol/L (ref 1.15–1.40)
Calcium, Ion: 1.24 mmol/L (ref 1.15–1.40)
Calcium, Ion: 0.68 mmol/L — CL (ref 1.15–1.40)
Calcium, Ion: 0.62 mmol/L — CL (ref 1.15–1.40)
Calcium, Ion: 0.62 mmol/L — CL (ref 1.15–1.40)
Calcium, Ion: 0.63 mmol/L — CL (ref 1.15–1.40)
Calcium, Ion: 0.64 mmol/L — CL (ref 1.15–1.40)
Calcium, Ion: 0.66 mmol/L — CL (ref 1.15–1.40)
Calcium, Ion: 0.9 mmol/L — ABNORMAL LOW (ref 1.15–1.40)
Chloride: 105 mmol/L (ref 98–111)
Chloride: 104 mmol/L (ref 98–111)
Chloride: 96 mmol/L — ABNORMAL LOW (ref 98–111)
Chloride: 95 mmol/L — ABNORMAL LOW (ref 98–111)
Chloride: 99 mmol/L (ref 98–111)
Chloride: 100 mmol/L (ref 98–111)
Chloride: 100 mmol/L (ref 98–111)
Chloride: 100 mmol/L (ref 98–111)
Chloride: 100 mmol/L (ref 98–111)
Creatinine, Ser: 0.6 mg/dL — ABNORMAL LOW (ref 0.61–1.24)
Creatinine, Ser: 0.6 mg/dL — ABNORMAL LOW (ref 0.61–1.24)
Creatinine, Ser: 0.6 mg/dL — ABNORMAL LOW (ref 0.61–1.24)
Creatinine, Ser: 0.5 mg/dL — ABNORMAL LOW (ref 0.61–1.24)
Creatinine, Ser: 0.6 mg/dL — ABNORMAL LOW (ref 0.61–1.24)
Creatinine, Ser: 0.6 mg/dL — ABNORMAL LOW (ref 0.61–1.24)
Creatinine, Ser: 0.6 mg/dL — ABNORMAL LOW (ref 0.61–1.24)
Creatinine, Ser: 0.6 mg/dL — ABNORMAL LOW (ref 0.61–1.24)
Creatinine, Ser: 0.8 mg/dL (ref 0.61–1.24)
Glucose, Bld: 103 mg/dL — ABNORMAL HIGH (ref 70–99)
Glucose, Bld: 119 mg/dL — ABNORMAL HIGH (ref 70–99)
Glucose, Bld: 221 mg/dL — ABNORMAL HIGH (ref 70–99)
Glucose, Bld: 177 mg/dL — ABNORMAL HIGH (ref 70–99)
Glucose, Bld: 160 mg/dL — ABNORMAL HIGH (ref 70–99)
Glucose, Bld: 119 mg/dL — ABNORMAL HIGH (ref 70–99)
Glucose, Bld: 94 mg/dL (ref 70–99)
Glucose, Bld: 101 mg/dL — ABNORMAL HIGH (ref 70–99)
Glucose, Bld: 119 mg/dL — ABNORMAL HIGH (ref 70–99)
HCT: 43 % (ref 39.0–52.0)
HCT: 38 % — ABNORMAL LOW (ref 39.0–52.0)
HCT: 24 % — ABNORMAL LOW (ref 39.0–52.0)
HCT: 24 % — ABNORMAL LOW (ref 39.0–52.0)
HCT: 26 % — ABNORMAL LOW (ref 39.0–52.0)
HCT: 26 % — ABNORMAL LOW (ref 39.0–52.0)
HCT: 28 % — ABNORMAL LOW (ref 39.0–52.0)
HCT: 28 % — ABNORMAL LOW (ref 39.0–52.0)
HCT: 24 % — ABNORMAL LOW (ref 39.0–52.0)
Hemoglobin: 14.6 g/dL (ref 13.0–17.0)
Hemoglobin: 12.9 g/dL — ABNORMAL LOW (ref 13.0–17.0)
Hemoglobin: 8.2 g/dL — ABNORMAL LOW (ref 13.0–17.0)
Hemoglobin: 8.2 g/dL — ABNORMAL LOW (ref 13.0–17.0)
Hemoglobin: 8.8 g/dL — ABNORMAL LOW (ref 13.0–17.0)
Hemoglobin: 8.8 g/dL — ABNORMAL LOW (ref 13.0–17.0)
Hemoglobin: 9.5 g/dL — ABNORMAL LOW (ref 13.0–17.0)
Hemoglobin: 9.5 g/dL — ABNORMAL LOW (ref 13.0–17.0)
Hemoglobin: 8.2 g/dL — ABNORMAL LOW (ref 13.0–17.0)
Potassium: 4.2 mmol/L (ref 3.5–5.1)
Potassium: 4.1 mmol/L (ref 3.5–5.1)
Potassium: 3.4 mmol/L — ABNORMAL LOW (ref 3.5–5.1)
Potassium: 2.9 mmol/L — ABNORMAL LOW (ref 3.5–5.1)
Potassium: 3.6 mmol/L (ref 3.5–5.1)
Potassium: 4 mmol/L (ref 3.5–5.1)
Potassium: 4 mmol/L (ref 3.5–5.1)
Potassium: 3.6 mmol/L (ref 3.5–5.1)
Potassium: 3.6 mmol/L (ref 3.5–5.1)
Sodium: 141 mmol/L (ref 135–145)
Sodium: 140 mmol/L (ref 135–145)
Sodium: 142 mmol/L (ref 135–145)
Sodium: 144 mmol/L (ref 135–145)
Sodium: 143 mmol/L (ref 135–145)
Sodium: 142 mmol/L (ref 135–145)
Sodium: 142 mmol/L (ref 135–145)
Sodium: 143 mmol/L (ref 135–145)
Sodium: 144 mmol/L (ref 135–145)
TCO2: 23 mmol/L (ref 22–32)
TCO2: 24 mmol/L (ref 22–32)
TCO2: 29 mmol/L (ref 22–32)
TCO2: 27 mmol/L (ref 22–32)
TCO2: 20 mmol/L — ABNORMAL LOW (ref 22–32)
TCO2: 21 mmol/L — ABNORMAL LOW (ref 22–32)
TCO2: 22 mmol/L (ref 22–32)
TCO2: 21 mmol/L — ABNORMAL LOW (ref 22–32)
TCO2: 25 mmol/L (ref 22–32)

## 2020-03-11 LAB — POCT I-STAT EG7
Acid-base deficit: 6 mmol/L — ABNORMAL HIGH (ref 0.0–2.0)
Bicarbonate: 21.4 mmol/L (ref 20.0–28.0)
Calcium, Ion: 0.69 mmol/L — CL (ref 1.15–1.40)
HCT: 26 % — ABNORMAL LOW (ref 39.0–52.0)
Hemoglobin: 8.8 g/dL — ABNORMAL LOW (ref 13.0–17.0)
O2 Saturation: 93 %
Potassium: 3 mmol/L — ABNORMAL LOW (ref 3.5–5.1)
Sodium: 141 mmol/L (ref 135–145)
TCO2: 23 mmol/L (ref 22–32)
pCO2, Ven: 49.7 mmHg (ref 44.0–60.0)
pH, Ven: 7.242 — ABNORMAL LOW (ref 7.250–7.430)
pO2, Ven: 80 mmHg — ABNORMAL HIGH (ref 32.0–45.0)

## 2020-03-11 LAB — PREPARE FRESH FROZEN PLASMA
Unit division: 0
Unit division: 0

## 2020-03-11 LAB — BPAM FFP
Blood Product Expiration Date: 202112132359
Blood Product Expiration Date: 202112132359
Unit Type and Rh: 600
Unit Type and Rh: 600

## 2020-03-11 LAB — GLUCOSE, CAPILLARY
Glucose-Capillary: 109 mg/dL — ABNORMAL HIGH (ref 70–99)
Glucose-Capillary: 121 mg/dL — ABNORMAL HIGH (ref 70–99)
Glucose-Capillary: 134 mg/dL — ABNORMAL HIGH (ref 70–99)
Glucose-Capillary: 138 mg/dL — ABNORMAL HIGH (ref 70–99)

## 2020-03-11 LAB — CBC
HCT: 28.8 % — ABNORMAL LOW (ref 39.0–52.0)
Hemoglobin: 10.1 g/dL — ABNORMAL LOW (ref 13.0–17.0)
MCH: 30.6 pg (ref 26.0–34.0)
MCHC: 35.1 g/dL (ref 30.0–36.0)
MCV: 87.3 fL (ref 80.0–100.0)
Platelets: 129 10*3/uL — ABNORMAL LOW (ref 150–400)
RBC: 3.3 MIL/uL — ABNORMAL LOW (ref 4.22–5.81)
RDW: 14.8 % (ref 11.5–15.5)
WBC: 14.5 10*3/uL — ABNORMAL HIGH (ref 4.0–10.5)
nRBC: 0 % (ref 0.0–0.2)

## 2020-03-11 LAB — PLATELET COUNT: Platelets: 26 10*3/uL — CL (ref 150–400)

## 2020-03-11 LAB — PREPARE RBC (CROSSMATCH)

## 2020-03-11 LAB — FIBRINOGEN
Fibrinogen: 60 mg/dL — CL (ref 210–475)
Fibrinogen: 266 mg/dL (ref 210–475)

## 2020-03-11 LAB — HEMOGLOBIN AND HEMATOCRIT, BLOOD
HCT: 28.6 % — ABNORMAL LOW (ref 39.0–52.0)
Hemoglobin: 9.5 g/dL — ABNORMAL LOW (ref 13.0–17.0)

## 2020-03-11 LAB — ECHO INTRAOPERATIVE TEE
AV Mean grad: 2 mmHg
Height: 70 in
Weight: 2960 oz

## 2020-03-11 LAB — ABO/RH: ABO/RH(D): A POS

## 2020-03-11 SURGERY — ECHOCARDIOGRAM, TRANSESOPHAGEAL
Anesthesia: General | Site: Esophagus

## 2020-03-11 MED ORDER — DEXMEDETOMIDINE HCL IN NACL 400 MCG/100ML IV SOLN
INTRAVENOUS | Status: DC | PRN
  Administered 2020-03-11: .3 ug/kg/h via INTRAVENOUS

## 2020-03-11 MED ORDER — BUPIVACAINE LIPOSOME 1.3 % IJ SUSP
INTRAMUSCULAR | Status: DC | PRN
  Administered 2020-03-11: 20 mL

## 2020-03-11 MED ORDER — CHLORHEXIDINE GLUCONATE 4 % EX LIQD: 30.0000 mL | CUTANEOUS | Status: DC

## 2020-03-11 MED ORDER — ONDANSETRON HCL 4 MG/2ML IJ SOLN
4.0000 mg | Freq: Four times a day (QID) | INTRAMUSCULAR | Status: DC | PRN
  Administered 2020-03-12 – 2020-03-21 (×8): 4 mg via INTRAVENOUS
  Filled 2020-03-11 (×11): qty 2

## 2020-03-11 MED ORDER — ASPIRIN 81 MG PO CHEW: 324.0000 mg | CHEWABLE_TABLET | Freq: Every day | ORAL | Status: DC

## 2020-03-11 MED ORDER — NITROGLYCERIN IN D5W 200-5 MCG/ML-% IV SOLN: 0.0000 ug/min | INTRAVENOUS | Status: DC

## 2020-03-11 MED ORDER — BISACODYL 5 MG PO TBEC
10.0000 mg | DELAYED_RELEASE_TABLET | Freq: Every day | ORAL | Status: DC
  Administered 2020-03-12 – 2020-03-24 (×8): 10 mg via ORAL
  Filled 2020-03-11 (×12): qty 2

## 2020-03-11 MED ORDER — EPINEPHRINE HCL 5 MG/250ML IV SOLN IN NS: 0.0000 ug/min | INTRAVENOUS | Status: DC

## 2020-03-11 MED ORDER — PHENYLEPHRINE HCL-NACL 20-0.9 MG/250ML-% IV SOLN
INTRAVENOUS | Status: DC | PRN
  Administered 2020-03-11: 20 ug/min via INTRAVENOUS

## 2020-03-11 MED ORDER — METOPROLOL TARTRATE 5 MG/5ML IV SOLN
2.5000 mg | INTRAVENOUS | Status: DC | PRN
  Administered 2020-03-22: 13:00:00 5 mg via INTRAVENOUS
  Filled 2020-03-11: qty 5

## 2020-03-11 MED ORDER — SODIUM CHLORIDE 0.9 % IV SOLN
INTRAVENOUS | Status: DC | PRN
  Administered 2020-03-11: 1.5 g via INTRAVENOUS

## 2020-03-11 MED ORDER — MAGNESIUM SULFATE 4 GM/100ML IV SOLN
4.0000 g | Freq: Once | INTRAVENOUS | Status: AC
  Administered 2020-03-11: 4 g via INTRAVENOUS
  Filled 2020-03-11: qty 100

## 2020-03-11 MED ORDER — NITROGLYCERIN IN D5W 200-5 MCG/ML-% IV SOLN
INTRAVENOUS | Status: DC | PRN
  Administered 2020-03-11: 16.6 ug/min via INTRAVENOUS

## 2020-03-11 MED ORDER — LACTATED RINGERS IV SOLN: INTRAVENOUS | Status: DC

## 2020-03-11 MED ORDER — CALCIUM CHLORIDE 10 % IV SOLN
INTRAVENOUS | Status: DC | PRN
  Administered 2020-03-11 (×8): 100 mg via INTRAVENOUS
  Administered 2020-03-11: 200 mg via INTRAVENOUS
  Administered 2020-03-11 (×4): 100 mg via INTRAVENOUS

## 2020-03-11 MED ORDER — BISACODYL 10 MG RE SUPP: 10.0000 mg | Freq: Every day | RECTAL | Status: DC

## 2020-03-11 MED ORDER — ALBUMIN HUMAN 5 % IV SOLN: 250.0000 mL | INTRAVENOUS | Status: AC | PRN

## 2020-03-11 MED ORDER — TRAMADOL HCL 50 MG PO TABS
50.0000 mg | ORAL_TABLET | ORAL | Status: DC | PRN
  Administered 2020-03-12 – 2020-03-22 (×3): 100 mg via ORAL
  Administered 2020-03-22 – 2020-03-24 (×5): 50 mg via ORAL
  Filled 2020-03-11 (×2): qty 2
  Filled 2020-03-11 (×4): qty 1
  Filled 2020-03-11: qty 2
  Filled 2020-03-11: qty 1

## 2020-03-11 MED ORDER — FENTANYL CITRATE (PF) 100 MCG/2ML IJ SOLN
INTRAMUSCULAR | Status: DC | PRN
  Administered 2020-03-11: 700 ug via INTRAVENOUS
  Administered 2020-03-11 (×3): 50 ug via INTRAVENOUS

## 2020-03-11 MED ORDER — CHLORHEXIDINE GLUCONATE 0.12 % MT SOLN
15.0000 mL | OROMUCOSAL | Status: AC
  Administered 2020-03-11: 15 mL via OROMUCOSAL

## 2020-03-11 MED ORDER — FENTANYL CITRATE (PF) 250 MCG/5ML IJ SOLN
INTRAMUSCULAR | Status: AC
  Filled 2020-03-11: qty 10

## 2020-03-11 MED ORDER — ORAL CARE MOUTH RINSE: 15.0000 mL | Freq: Once | OROMUCOSAL | Status: AC

## 2020-03-11 MED ORDER — 0.9 % SODIUM CHLORIDE (POUR BTL) OPTIME
TOPICAL | Status: DC | PRN
  Administered 2020-03-11: 4000 mL
  Administered 2020-03-11 (×2): 1000 mL

## 2020-03-11 MED ORDER — DEXMEDETOMIDINE HCL IN NACL 200 MCG/50ML IV SOLN
INTRAVENOUS | Status: AC
  Filled 2020-03-11: qty 50

## 2020-03-11 MED ORDER — ACETAMINOPHEN 650 MG RE SUPP
650.0000 mg | Freq: Once | RECTAL | Status: AC
  Administered 2020-03-11: 650 mg via RECTAL

## 2020-03-11 MED ORDER — PROPOFOL 10 MG/ML IV BOLUS
INTRAVENOUS | Status: AC
  Filled 2020-03-11: qty 20

## 2020-03-11 MED ORDER — LACTATED RINGERS IV SOLN: INTRAVENOUS | Status: DC | PRN

## 2020-03-11 MED ORDER — OXYCODONE HCL 5 MG PO TABS
5.0000 mg | ORAL_TABLET | ORAL | Status: DC | PRN
  Administered 2020-03-12 – 2020-03-22 (×9): 10 mg via ORAL
  Administered 2020-03-24: 5 mg via ORAL
  Filled 2020-03-11 (×10): qty 2

## 2020-03-11 MED ORDER — ARTIFICIAL TEARS OPHTHALMIC OINT
TOPICAL_OINTMENT | OPHTHALMIC | Status: DC | PRN
  Administered 2020-03-11: 1 via OPHTHALMIC

## 2020-03-11 MED ORDER — INSULIN REGULAR(HUMAN) IN NACL 100-0.9 UT/100ML-% IV SOLN
INTRAVENOUS | Status: DC | PRN
  Administered 2020-03-11: 9.5 [IU]/h via INTRAVENOUS

## 2020-03-11 MED ORDER — ACETAMINOPHEN 500 MG PO TABS
1000.0000 mg | ORAL_TABLET | Freq: Four times a day (QID) | ORAL | Status: AC
  Administered 2020-03-12 – 2020-03-16 (×17): 1000 mg via ORAL
  Filled 2020-03-11 (×16): qty 2

## 2020-03-11 MED ORDER — AMIODARONE HCL IN DEXTROSE 360-4.14 MG/200ML-% IV SOLN
INTRAVENOUS | Status: DC | PRN
  Administered 2020-03-11: 60 mg/h via INTRAVENOUS

## 2020-03-11 MED ORDER — AMIODARONE HCL IN DEXTROSE 360-4.14 MG/200ML-% IV SOLN
30.0000 mg/h | INTRAVENOUS | Status: DC
  Administered 2020-03-11: 60 mg/h via INTRAVENOUS
  Administered 2020-03-12 – 2020-03-15 (×7): 30 mg/h via INTRAVENOUS
  Filled 2020-03-11 (×7): qty 200

## 2020-03-11 MED ORDER — ASPIRIN EC 325 MG PO TBEC: 325.0000 mg | DELAYED_RELEASE_TABLET | Freq: Every day | ORAL | Status: DC

## 2020-03-11 MED ORDER — PROTAMINE SULFATE 10 MG/ML IV SOLN
INTRAVENOUS | Status: DC | PRN
  Administered 2020-03-11: 290 mg via INTRAVENOUS
  Administered 2020-03-11: 10 mg via INTRAVENOUS

## 2020-03-11 MED ORDER — SODIUM CHLORIDE 0.9% FLUSH: 3.0000 mL | INTRAVENOUS | Status: DC | PRN

## 2020-03-11 MED ORDER — SODIUM CHLORIDE 0.9 % IV SOLN: 250.0000 mL | INTRAVENOUS | Status: DC

## 2020-03-11 MED ORDER — PHENYLEPHRINE 40 MCG/ML (10ML) SYRINGE FOR IV PUSH (FOR BLOOD PRESSURE SUPPORT)
PREFILLED_SYRINGE | INTRAVENOUS | Status: DC | PRN
  Administered 2020-03-11: 80 ug via INTRAVENOUS

## 2020-03-11 MED ORDER — PROPOFOL 10 MG/ML IV BOLUS
INTRAVENOUS | Status: DC | PRN
  Administered 2020-03-11: 30 mg via INTRAVENOUS
  Administered 2020-03-11: 50 mg via INTRAVENOUS

## 2020-03-11 MED ORDER — PLASMA-LYTE 148 IV SOLN
INTRAVENOUS | Status: DC | PRN
  Administered 2020-03-11: 500 mL via INTRAVASCULAR

## 2020-03-11 MED ORDER — SODIUM CHLORIDE (PF) 0.9 % IJ SOLN
OROMUCOSAL | Status: DC | PRN
  Administered 2020-03-11 (×6): 4 mL via TOPICAL

## 2020-03-11 MED ORDER — SODIUM CHLORIDE 0.9 % IV SOLN
1.5000 g | Freq: Two times a day (BID) | INTRAVENOUS | Status: AC
  Administered 2020-03-11 – 2020-03-13 (×4): 1.5 g via INTRAVENOUS
  Filled 2020-03-11 (×4): qty 1.5

## 2020-03-11 MED ORDER — AMIODARONE IV BOLUS ONLY 150 MG/100ML
INTRAVENOUS | Status: DC | PRN
  Administered 2020-03-11: 150 mg via INTRAVENOUS

## 2020-03-11 MED ORDER — SODIUM CHLORIDE 0.9 % IV SOLN: INTRAVENOUS | Status: DC

## 2020-03-11 MED ORDER — DEXTROSE 50 % IV SOLN: 0.0000 mL | INTRAVENOUS | Status: DC | PRN

## 2020-03-11 MED ORDER — VANCOMYCIN HCL IN DEXTROSE 1-5 GM/200ML-% IV SOLN
1000.0000 mg | Freq: Once | INTRAVENOUS | Status: AC
  Administered 2020-03-11: 1000 mg via INTRAVENOUS
  Filled 2020-03-11: qty 200

## 2020-03-11 MED ORDER — PANTOPRAZOLE SODIUM 40 MG PO TBEC
40.0000 mg | DELAYED_RELEASE_TABLET | Freq: Every day | ORAL | Status: DC
  Administered 2020-03-13 – 2020-03-15 (×3): 40 mg via ORAL
  Filled 2020-03-11 (×3): qty 1

## 2020-03-11 MED ORDER — SODIUM CHLORIDE 0.9% IV SOLUTION: Freq: Once | INTRAVENOUS | Status: DC

## 2020-03-11 MED ORDER — ACETAMINOPHEN 160 MG/5ML PO SOLN
1000.0000 mg | Freq: Four times a day (QID) | ORAL | Status: DC
  Administered 2020-03-11 – 2020-03-12 (×2): 1000 mg
  Filled 2020-03-11: qty 40.6

## 2020-03-11 MED ORDER — VANCOMYCIN HCL 1000 MG IV SOLR
INTRAVENOUS | Status: DC | PRN
  Administered 2020-03-11: 1000 mL

## 2020-03-11 MED ORDER — MIDAZOLAM HCL (PF) 5 MG/ML IJ SOLN
INTRAMUSCULAR | Status: DC | PRN
  Administered 2020-03-11 (×3): 1 mg via INTRAVENOUS

## 2020-03-11 MED ORDER — HEPARIN SODIUM (PORCINE) 1000 UNIT/ML IJ SOLN
INTRAMUSCULAR | Status: DC | PRN
  Administered 2020-03-11: 30000 [IU] via INTRAVENOUS

## 2020-03-11 MED ORDER — MIDAZOLAM HCL 2 MG/2ML IJ SOLN: 2.0000 mg | INTRAMUSCULAR | Status: DC | PRN

## 2020-03-11 MED ORDER — LACTATED RINGERS IV SOLN: 500.0000 mL | Freq: Once | INTRAVENOUS | Status: DC | PRN

## 2020-03-11 MED ORDER — MILRINONE LACTATE IN DEXTROSE 20-5 MG/100ML-% IV SOLN
0.0000 ug/kg/min | INTRAVENOUS | Status: DC
  Administered 2020-03-12 – 2020-03-13 (×3): 0.25 ug/kg/min via INTRAVENOUS
  Filled 2020-03-11 (×3): qty 100

## 2020-03-11 MED ORDER — CHLORHEXIDINE GLUCONATE 0.12 % MT SOLN: 15.0000 mL | Freq: Once | OROMUCOSAL | Status: DC

## 2020-03-11 MED ORDER — ACETAMINOPHEN 160 MG/5ML PO SOLN: 650.0000 mg | Freq: Once | ORAL | Status: AC

## 2020-03-11 MED ORDER — COAGULATION FACTOR VIIA RECOMB 1 MG IV SOLR
90.0000 ug/kg | Freq: Once | INTRAVENOUS | Status: DC
  Filled 2020-03-11: qty 8

## 2020-03-11 MED ORDER — POTASSIUM CHLORIDE 10 MEQ/50ML IV SOLN
10.0000 meq | INTRAVENOUS | Status: AC
  Administered 2020-03-11 (×3): 10 meq via INTRAVENOUS

## 2020-03-11 MED ORDER — MIDAZOLAM HCL (PF) 10 MG/2ML IJ SOLN
INTRAMUSCULAR | Status: AC
  Filled 2020-03-11: qty 2

## 2020-03-11 MED ORDER — FENTANYL CITRATE (PF) 250 MCG/5ML IJ SOLN
INTRAMUSCULAR | Status: AC
  Filled 2020-03-11: qty 5

## 2020-03-11 MED ORDER — DOCUSATE SODIUM 100 MG PO CAPS
200.0000 mg | ORAL_CAPSULE | Freq: Every day | ORAL | Status: DC
  Administered 2020-03-12 – 2020-03-29 (×13): 200 mg via ORAL
  Filled 2020-03-11 (×16): qty 2

## 2020-03-11 MED ORDER — SODIUM CHLORIDE 0.9 % IR SOLN
Status: DC | PRN
  Administered 2020-03-11: 1000 mL

## 2020-03-11 MED ORDER — BUPIVACAINE HCL (PF) 0.5 % IJ SOLN
INTRAMUSCULAR | Status: AC
  Filled 2020-03-11: qty 30

## 2020-03-11 MED ORDER — METHYLPREDNISOLONE SODIUM SUCC 125 MG IJ SOLR
INTRAMUSCULAR | Status: DC | PRN
  Administered 2020-03-11: 125 mg via INTRAVENOUS

## 2020-03-11 MED ORDER — TRANEXAMIC ACID 1000 MG/10ML IV SOLN
1.5000 mg/kg/h | INTRAVENOUS | Status: DC
  Filled 2020-03-11 (×2): qty 25

## 2020-03-11 MED ORDER — DEXMEDETOMIDINE HCL IN NACL 400 MCG/100ML IV SOLN
0.0000 ug/kg/h | INTRAVENOUS | Status: DC
  Administered 2020-03-11: 0.7 ug/kg/h via INTRAVENOUS
  Administered 2020-03-12: 0.5 ug/kg/h via INTRAVENOUS
  Filled 2020-03-11 (×2): qty 100

## 2020-03-11 MED ORDER — CHLORHEXIDINE GLUCONATE 0.12 % MT SOLN
15.0000 mL | Freq: Once | OROMUCOSAL | Status: AC
  Administered 2020-03-11: 15 mL via OROMUCOSAL
  Filled 2020-03-11: qty 15

## 2020-03-11 MED ORDER — SODIUM CHLORIDE 0.45 % IV SOLN: INTRAVENOUS | Status: DC | PRN

## 2020-03-11 MED ORDER — ROCURONIUM BROMIDE 10 MG/ML (PF) SYRINGE
PREFILLED_SYRINGE | INTRAVENOUS | Status: DC | PRN
  Administered 2020-03-11 (×3): 50 mg via INTRAVENOUS
  Administered 2020-03-11: 40 mg via INTRAVENOUS
  Administered 2020-03-11: 100 mg via INTRAVENOUS
  Administered 2020-03-11: 30 mg via INTRAVENOUS

## 2020-03-11 MED ORDER — EPHEDRINE SULFATE-NACL 50-0.9 MG/10ML-% IV SOSY
PREFILLED_SYRINGE | INTRAVENOUS | Status: DC | PRN
  Administered 2020-03-11: 5 mg via INTRAVENOUS

## 2020-03-11 MED ORDER — FAMOTIDINE IN NACL 20-0.9 MG/50ML-% IV SOLN
20.0000 mg | Freq: Two times a day (BID) | INTRAVENOUS | Status: AC
  Administered 2020-03-12 (×2): 20 mg via INTRAVENOUS
  Filled 2020-03-11 (×3): qty 50

## 2020-03-11 MED ORDER — METOPROLOL TARTRATE 12.5 MG HALF TABLET
12.5000 mg | ORAL_TABLET | Freq: Once | ORAL | Status: AC
  Administered 2020-03-11: 12.5 mg via ORAL
  Filled 2020-03-11: qty 1

## 2020-03-11 MED ORDER — MORPHINE SULFATE (PF) 2 MG/ML IV SOLN
1.0000 mg | INTRAVENOUS | Status: DC | PRN
  Administered 2020-03-11 – 2020-03-12 (×4): 2 mg via INTRAVENOUS
  Administered 2020-03-12: 4 mg via INTRAVENOUS
  Administered 2020-03-12: 2 mg via INTRAVENOUS
  Administered 2020-03-12: 4 mg via INTRAVENOUS
  Administered 2020-03-13 – 2020-03-14 (×2): 2 mg via INTRAVENOUS
  Filled 2020-03-11 (×2): qty 1
  Filled 2020-03-11: qty 2
  Filled 2020-03-11 (×2): qty 1
  Filled 2020-03-11: qty 2
  Filled 2020-03-11 (×3): qty 1

## 2020-03-11 MED ORDER — PHENYLEPHRINE HCL-NACL 20-0.9 MG/250ML-% IV SOLN
0.0000 ug/min | INTRAVENOUS | Status: DC
  Administered 2020-03-11: 15 ug/min via INTRAVENOUS
  Administered 2020-03-12: 30 ug/min via INTRAVENOUS
  Administered 2020-03-12 – 2020-03-13 (×2): 50 ug/min via INTRAVENOUS
  Administered 2020-03-14: 04:00:00 25 ug/min via INTRAVENOUS
  Filled 2020-03-11 (×3): qty 250

## 2020-03-11 MED ORDER — BUPIVACAINE LIPOSOME 1.3 % IJ SUSP
20.0000 mL | Freq: Once | INTRAMUSCULAR | Status: DC
  Filled 2020-03-11: qty 20

## 2020-03-11 MED ORDER — EPINEPHRINE HCL 5 MG/250ML IV SOLN IN NS
INTRAVENOUS | Status: DC | PRN
  Administered 2020-03-11: 1 ug/min via INTRAVENOUS

## 2020-03-11 MED ORDER — INSULIN REGULAR(HUMAN) IN NACL 100-0.9 UT/100ML-% IV SOLN
INTRAVENOUS | Status: DC
  Filled 2020-03-11: qty 100

## 2020-03-11 MED ORDER — COAGULATION FACTOR VIIA RECOMB 1 MG IV SOLR
INTRAVENOUS | Status: DC | PRN
  Administered 2020-03-11 (×8): 1 mg via INTRAVENOUS

## 2020-03-11 MED ORDER — SODIUM CHLORIDE 0.9 % IV SOLN: INTRAVENOUS | Status: DC | PRN

## 2020-03-11 MED ORDER — SODIUM CHLORIDE 0.9% FLUSH
3.0000 mL | Freq: Two times a day (BID) | INTRAVENOUS | Status: DC
  Administered 2020-03-12 – 2020-03-14 (×3): 3 mL via INTRAVENOUS

## 2020-03-11 SURGICAL SUPPLY — 147 items
ADAPTER CARDIO PERF ANTE/RETRO (ADAPTER) ×4 IMPLANT
ADH SKN CLS APL DERMABOND .7 (GAUZE/BANDAGES/DRESSINGS) ×6
ADH SRG 12 PREFL SYR 3 SPRDR (MISCELLANEOUS) ×3
ADPR PRFSN 84XANTGRD RTRGD (ADAPTER) ×3
BAG DECANTER FOR FLEXI CONT (MISCELLANEOUS) ×8 IMPLANT
BLADE CLIPPER SURG (BLADE) ×4 IMPLANT
BLADE SURG 11 STRL SS (BLADE) ×4 IMPLANT
CANISTER SUCT 3000ML PPV (MISCELLANEOUS) ×8 IMPLANT
CANNULA ADULT BIO-MEDICUS 15FR (CANNULA) ×2 IMPLANT
CANNULA AORTIC ROOT 9FR (CANNULA) ×2 IMPLANT
CANNULA FEM VENOUS REMOTE 22FR (CANNULA) ×2 IMPLANT
CANNULA FEMORAL ART 14 SM (MISCELLANEOUS) ×4 IMPLANT
CANNULA GUNDRY RCSP 15FR (MISCELLANEOUS) ×4 IMPLANT
CANNULA OPTISITE PERFUSION 16F (CANNULA) IMPLANT
CANNULA OPTISITE PERFUSION 18F (CANNULA) ×2 IMPLANT
CANNULA SUMP PERICARDIAL (CANNULA) ×8 IMPLANT
CATH CPB KIT OWEN (MISCELLANEOUS) IMPLANT
CATH HEART VENT LEFT (CATHETERS) ×1 IMPLANT
CATH KIT ON-Q SILVERSOAK 5 (CATHETERS) IMPLANT
CATH KIT ON-Q SILVERSOAK 5IN (CATHETERS) IMPLANT
CAUTERY SURG HI TEMP FINE TIP (MISCELLANEOUS) ×2 IMPLANT
CELLS DAT CNTRL 66122 CELL SVR (MISCELLANEOUS) ×3 IMPLANT
CLOSURE PERCLOSE PROSTYLE (VASCULAR PRODUCTS) ×10 IMPLANT
CNTNR URN SCR LID CUP LEK RST (MISCELLANEOUS) ×4 IMPLANT
CONN ST 1/4X3/8  BEN (MISCELLANEOUS) ×12
CONN ST 1/4X3/8 BEN (MISCELLANEOUS) ×7 IMPLANT
CONNECTOR 1/2X3/8X1/2 3 WAY (MISCELLANEOUS) ×4
CONNECTOR 1/2X3/8X1/2 3WAY (MISCELLANEOUS) ×3 IMPLANT
CONT SPEC 4OZ STRL OR WHT (MISCELLANEOUS) ×8
COVER BACK TABLE 24X17X13 BIG (DRAPES) ×4 IMPLANT
COVER PROBE W GEL 5X96 (DRAPES) ×4 IMPLANT
DERMABOND ADVANCED (GAUZE/BANDAGES/DRESSINGS) ×2
DERMABOND ADVANCED .7 DNX12 (GAUZE/BANDAGES/DRESSINGS) ×6 IMPLANT
DEVICE CLOSURE PERCLS PRGLD 6F (VASCULAR PRODUCTS) ×12 IMPLANT
DEVICE SUT CK QUICK LOAD INDV (Prosthesis & Implant Heart) ×6 IMPLANT
DEVICE SUT CK QUICK LOAD MINI (Prosthesis & Implant Heart) ×2 IMPLANT
DEVICE TROCAR PUNCTURE CLOSURE (ENDOMECHANICALS) ×4 IMPLANT
DRAIN CHANNEL 32F RND 10.7 FF (WOUND CARE) ×12 IMPLANT
DRAPE C-ARM 42X72 X-RAY (DRAPES) ×4 IMPLANT
DRAPE CV SPLIT W-CLR ANES SCRN (DRAPES) ×4 IMPLANT
DRAPE INCISE IOBAN 66X45 STRL (DRAPES) ×14 IMPLANT
DRAPE PERI GROIN 82X75IN TIB (DRAPES) ×4 IMPLANT
DRAPE SLUSH/WARMER DISC (DRAPES) ×4 IMPLANT
DRSG AQUACEL AG ADV 3.5X10 (GAUZE/BANDAGES/DRESSINGS) ×4 IMPLANT
DRSG AQUACEL AG ADV 3.5X14 (GAUZE/BANDAGES/DRESSINGS) ×2 IMPLANT
ELECT BLADE 6.5 EXT (BLADE) ×4 IMPLANT
ELECT REM PT RETURN 9FT ADLT (ELECTROSURGICAL) ×8
ELECTRODE REM PT RTRN 9FT ADLT (ELECTROSURGICAL) ×6 IMPLANT
FELT TEFLON 1X6 (MISCELLANEOUS) ×8 IMPLANT
FEMORAL VENOUS CANN RAP (CANNULA) IMPLANT
FIBERTAPE STERNAL CLSR 2 36IN (SUTURE) ×8 IMPLANT
FIBERTAPE STERNAL CLSR 2X36 (SUTURE) ×8 IMPLANT
GAUZE SPONGE 4X4 12PLY STRL (GAUZE/BANDAGES/DRESSINGS) ×2 IMPLANT
GAUZE SPONGE 4X4 12PLY STRL LF (GAUZE/BANDAGES/DRESSINGS) ×4 IMPLANT
GLOVE BIO SURGEON STRL SZ 6.5 (GLOVE) ×6 IMPLANT
GLOVE BIO SURGEON STRL SZ8 (GLOVE) ×4 IMPLANT
GLOVE ORTHO TXT STRL SZ7.5 (GLOVE) ×12 IMPLANT
GLOVE SURG UNDER POLY LF SZ7 (GLOVE) ×2 IMPLANT
GOWN STRL REUS W/ TWL LRG LVL3 (GOWN DISPOSABLE) ×16 IMPLANT
GOWN STRL REUS W/TWL LRG LVL3 (GOWN DISPOSABLE) ×32
GRAFT HEMASHIELD 28X40 (Vascular Products) ×4 IMPLANT
GRAFT HEMASHIELD 28X50 (Vascular Products) IMPLANT
GRAFT WOVEN D/V 26DX30L (Vascular Products) ×2 IMPLANT
GRASPER SUT TROCAR 14GX15 (MISCELLANEOUS) ×4 IMPLANT
HANDLE STAPLE ENDO GIA SHORT (STAPLE) ×1
HEMOSTAT POWDER SURGIFOAM 1G (HEMOSTASIS) ×6 IMPLANT
INSERT FOGARTY XLG (MISCELLANEOUS) ×2 IMPLANT
KIT BASIN OR (CUSTOM PROCEDURE TRAY) ×4 IMPLANT
KIT DILATOR VASC 18G NDL (KITS) ×6 IMPLANT
KIT DRAINAGE VACCUM ASSIST (KITS) ×2 IMPLANT
KIT SUCTION CATH 14FR (SUCTIONS) ×8 IMPLANT
KIT SUT CK MINI COMBO 4X17 (Prosthesis & Implant Heart) ×2 IMPLANT
KIT TURNOVER KIT B (KITS) ×4 IMPLANT
LEAD PACING MYOCARDI (MISCELLANEOUS) ×4 IMPLANT
LINE VENT (MISCELLANEOUS) ×2 IMPLANT
NDL AORTIC ROOT 14G 7F (CATHETERS) ×2 IMPLANT
NDL PERC 18GX7CM (NEEDLE) IMPLANT
NDL SUT 4 .5 CRC FRENCH EYE (NEEDLE) ×1 IMPLANT
NDL SUT PASSING CERCLAG MED (SUTURE) IMPLANT
NDL SUT PASSING CERCLAGE MED (SUTURE) ×12
NEEDLE AORTIC ROOT 14G 7F (CATHETERS) ×4 IMPLANT
NEEDLE FRENCH EYE (NEEDLE) ×4
NEEDLE PERC 18GX7CM (NEEDLE) ×4 IMPLANT
NEEDLE SUT PASSING CERCLAG MED (SUTURE) ×9 IMPLANT
NS IRRIG 1000ML POUR BTL (IV SOLUTION) ×20 IMPLANT
PACK E MIN INVASIVE VALVE (SUTURE) ×4 IMPLANT
PACK OPEN HEART (CUSTOM PROCEDURE TRAY) ×4 IMPLANT
PAD ARMBOARD 7.5X6 YLW CONV (MISCELLANEOUS) ×8 IMPLANT
PAD ELECT DEFIB RADIOL ZOLL (MISCELLANEOUS) ×4 IMPLANT
PERCLOSE PROGLIDE 6F (VASCULAR PRODUCTS) ×16
POSITIONER HEAD DONUT 9IN (MISCELLANEOUS) ×4 IMPLANT
POWDER SURGICEL 3.0 GRAM (HEMOSTASIS) ×2 IMPLANT
RELOAD TRI 2.0 30 VAS MED SUL (STAPLE) ×2 IMPLANT
RETRACTOR WND ALEXIS 18 MED (MISCELLANEOUS) ×2 IMPLANT
RING MITRAL MEMO 4D 32 (Prosthesis & Implant Heart) ×2 IMPLANT
RTRCTR WOUND ALEXIS 18CM MED (MISCELLANEOUS) ×4
SEALANT SURG COSEAL 8ML (VASCULAR PRODUCTS) ×2 IMPLANT
SET CANNULATION TOURNIQUET (MISCELLANEOUS) ×4 IMPLANT
SET CARDIOPLEGIA MPS 5001102 (MISCELLANEOUS) ×2 IMPLANT
SET IRRIG TUBING LAPAROSCOPIC (IRRIGATION / IRRIGATOR) ×4 IMPLANT
SET MICROPUNCTURE 5F STIFF (MISCELLANEOUS) ×4 IMPLANT
SHEATH PINNACLE 8F 10CM (SHEATH) ×12 IMPLANT
SIZER CHORD-X CHORDAL CXCS (SIZER) ×2 IMPLANT
SOL ANTI FOG 6CC (MISCELLANEOUS) ×3 IMPLANT
SOLUTION ANTI FOG 6CC (MISCELLANEOUS) ×1
STAPLER ENDO GIA 12 SHRT THIN (STAPLE) IMPLANT
STAPLER ENDO GIA 12MM SHORT (STAPLE) ×3 IMPLANT
SUT BONE WAX W31G (SUTURE) ×4 IMPLANT
SUT EB EXC GRN/WHT 2-0 D/A SH (SUTURE) ×4
SUT EB EXC GRN/WHT 2-0 V-5 (SUTURE) ×4 IMPLANT
SUT ETHIBOND (SUTURE) ×2 IMPLANT
SUT ETHIBOND 2-0 RB-1 WHT (SUTURE) ×6 IMPLANT
SUT ETHIBOND X763 2 0 SH 1 (SUTURE) ×6 IMPLANT
SUT GORETEX CV 4 TH 22 36 (SUTURE) IMPLANT
SUT GORETEX CV4 TH-18 (SUTURE) IMPLANT
SUT MNCRL AB 3-0 PS2 18 (SUTURE) ×4 IMPLANT
SUT PDS AB 1 CTX 36 (SUTURE) ×4 IMPLANT
SUT PROLENE 3 0 SH DA (SUTURE) ×12 IMPLANT
SUT PROLENE 3 0 SH1 36 (SUTURE) ×20 IMPLANT
SUT PROLENE 4 0 RB 1 (SUTURE) ×100
SUT PROLENE 4 0 SH DA (SUTURE) ×8 IMPLANT
SUT PROLENE 4-0 RB1 .5 CRCL 36 (SUTURE) ×25 IMPLANT
SUT PROLENE 6 0 C 1 30 (SUTURE) ×4 IMPLANT
SUT PTFE CHORD X 16MM (SUTURE) ×2 IMPLANT
SUT SILK  1 MH (SUTURE) ×4
SUT SILK 1 MH (SUTURE) ×1 IMPLANT
SUT SILK 2 0 SH CR/8 (SUTURE) ×4 IMPLANT
SUT VIC AB 2-0 CTX 27 (SUTURE) ×6 IMPLANT
SUTURE EB EXC GRN/WHT 2-0 D/A (SUTURE) ×1 IMPLANT
SYR 10ML KIT SKIN ADHESIVE (MISCELLANEOUS) ×2 IMPLANT
SYR 20ML LL LF (SYRINGE) ×2 IMPLANT
SYSTEM SAHARA CHEST DRAIN ATS (WOUND CARE) ×4 IMPLANT
TAPE CLOTH SURG 4X10 WHT LF (GAUZE/BANDAGES/DRESSINGS) ×4 IMPLANT
TAPE PAPER 2X10 WHT MICROPORE (GAUZE/BANDAGES/DRESSINGS) ×2 IMPLANT
TOWEL GREEN STERILE (TOWEL DISPOSABLE) ×4 IMPLANT
TOWEL GREEN STERILE FF (TOWEL DISPOSABLE) ×4 IMPLANT
TRAY FOLEY SLVR 16FR TEMP STAT (SET/KITS/TRAYS/PACK) ×4 IMPLANT
TROCAR XCEL BLADELESS 5X75MML (TROCAR) ×4 IMPLANT
TROCAR XCEL NON-BLD 11X100MML (ENDOMECHANICALS) ×8 IMPLANT
TUBE CONNECTING 12X1/4 (SUCTIONS) ×4 IMPLANT
TUBE SUCT INTRACARD DLP 20F (MISCELLANEOUS) ×6 IMPLANT
TUNNELER SHEATH ON-Q 11GX8 DSP (PAIN MANAGEMENT) IMPLANT
UNDERPAD 30X36 HEAVY ABSORB (UNDERPADS AND DIAPERS) ×4 IMPLANT
VENT LEFT HEART 12002 (CATHETERS) ×4
WATER STERILE IRR 1000ML POUR (IV SOLUTION) ×8 IMPLANT
WIRE EMERALD 3MM-J .035X150CM (WIRE) ×6 IMPLANT
YANKAUER SUCT BULB TIP NO VENT (SUCTIONS) ×2 IMPLANT

## 2020-03-11 NOTE — Transfer of Care (Signed)
Immediate Anesthesia Transfer of Care Note  Patient: Patrick Bell  Procedure(s) Performed: TRANSESOPHAGEAL ECHOCARDIOGRAM (TEE) (N/A Esophagus) REPAIR OF ACUTE ASCENDING THORACIC AORTIC DISSECTION USING A HEMASHIELD PLATINUM 26MM STRAIGHT GRAFT AND A HEASHIELD PLATINUM 28X10MM SINGLE ARM GRAFT (N/A Chest) MITRAL VALVE REPAIR (MVR) USING MEMO 4D SIZE 32 RING (N/A Chest)  Patient Location: SICU  Anesthesia Type:General  Level of Consciousness: sedated and Patient remains intubated per anesthesia plan  Airway & Oxygen Therapy: Patient remains intubated per anesthesia plan and Patient placed on Ventilator (see vital sign flow sheet for setting)  Post-op Assessment: Report given to RN and Post -op Vital signs reviewed and stable  Post vital signs: Reviewed and stable  Last Vitals:  Vitals Value Taken Time  BP 100/58   Temp 35 C 03/11/20 1931  Pulse 90 03/11/20 1931  Resp 19 03/11/20 1931  SpO2 97 % 03/11/20 1931  Vitals shown include unvalidated device data.  Last Pain:  Vitals:   03/11/20 0714  TempSrc:   PainSc: 0-No pain      Patients Stated Pain Goal: 5 (77/41/42 3953)  Complications: No complications documented.

## 2020-03-11 NOTE — Anesthesia Procedure Notes (Signed)
Procedure Name: Intubation Date/Time: 03/11/2020 6:48 PM Performed by: Verdie Drown, CRNA Pre-anesthesia Checklist: Patient identified, Emergency Drugs available, Suction available and Patient being monitored Patient Re-evaluated:Patient Re-evaluated prior to induction Oxygen Delivery Method: Circle System Utilized Preoxygenation: Pre-oxygenation with 100% oxygen Laryngoscope Size: Glidescope and 4 Grade View: Grade II Tube type: Oral Tube size: 8.0 mm Number of attempts: 1 Airway Equipment and Method: Stylet Placement Confirmation: ETT inserted through vocal cords under direct vision,  positive ETCO2 and breath sounds checked- equal and bilateral Secured at: 24 cm Tube secured with: Tape Dental Injury: Teeth and Oropharynx as per pre-operative assessment  Comments: Tube exchanged over cook catheter.  Oro/tongue/laryngo pharynx swollen.

## 2020-03-11 NOTE — Anesthesia Procedure Notes (Signed)
Arterial Line Insertion Start/End12/11/2019 8:00 AM, 03/11/2020 8:05 AM  Patient location: Pre-op. Preanesthetic checklist: patient identified, IV checked, site marked, risks and benefits discussed, surgical consent, monitors and equipment checked, pre-op evaluation, timeout performed and anesthesia consent Lidocaine 1% used for infiltration Left, radial was placed Catheter size: 20 Fr Hand hygiene performed , maximum sterile barriers used  and Seldinger technique used Allen's test indicative of satisfactory collateral circulation Attempts: 1 Procedure performed without using ultrasound guided technique. Following insertion, dressing applied and Biopatch. Post procedure assessment: normal and unchanged  Patient tolerated the procedure well with no immediate complications.

## 2020-03-11 NOTE — Progress Notes (Signed)
Echocardiogram Echocardiogram Transesophageal has been performed.  [name] M 03/11/2020, 9:24 AM

## 2020-03-11 NOTE — Anesthesia Procedure Notes (Signed)
Central Venous Catheter Insertion Performed by: Annye Asa, MD, anesthesiologist Start/End12/11/2019 7:42 AM, 03/11/2020 7:58 AM Patient location: Pre-op. Preanesthetic checklist: patient identified, IV checked, site marked, risks and benefits discussed, surgical consent, monitors and equipment checked, pre-op evaluation, timeout performed and anesthesia consent Position: supine Lidocaine 1% used for infiltration and patient sedated Hand hygiene performed , maximum sterile barriers used  and Seldinger technique used Catheter size: 8.5 Fr PA cath was placed.Sheath introducer Swan type:thermodilution Procedure performed using ultrasound guided technique. Ultrasound Notes:anatomy identified, needle tip was noted to be adjacent to the nerve/plexus identified, no ultrasound evidence of intravascular and/or intraneural injection and image(s) printed for medical record Attempts: 1 Following insertion, line sutured, dressing applied and Biopatch. Post procedure assessment: blood return through all ports, free fluid flow and no air  Patient tolerated the procedure well with no immediate complications. Additional procedure comments: PA catheter:  Routine monitors. Timeout, sterile prep, drape, FBP L neck.  Supine position.  1% Lido local, finder and trocar LIJ 1st pass with US guidance.  Cordis placed over J wire. PA catheter in easily.  Sterile dressing applied.  Patient tolerated well, VSS.  Jenita Seashore, MD.

## 2020-03-11 NOTE — Progress Notes (Signed)
Patient ID: Patrick Bell, male   DOB: 1940-07-31, 79 y.o.   MRN: [id number] EVENING ROUNDS NOTE :     [address]       [place] [address]             [phone]                 Day of Surgery Procedure(s) (LRB): TRANSESOPHAGEAL ECHOCARDIOGRAM (TEE) (N/A) REPAIR OF ACUTE ASCENDING THORACIC AORTIC DISSECTION USING A HEMASHIELD PLATINUM 26MM STRAIGHT GRAFT AND A HEASHIELD PLATINUM 28X10MM SINGLE ARM GRAFT (N/A) MITRAL VALVE REPAIR (MVR) USING MEMO 4D SIZE 32 RING (N/A)  Total Length of Stay:  LOS: 0 days  BP (!) 157/87   Pulse 91   Temp 97.9 F (36.6 C) (Temporal)   Resp 14   Ht 5\' 10"  (1.778 m)   Wt 83.9 kg   SpO2 96%   BMI 26.54 kg/m   .Intake/Output      12/08 0701 - 12/09 0700   I.V. (mL/kg) 2500 (29.8)   Blood 3920   IV Piggyback 100   Total Intake(mL/kg) 6520 (77.7)   Urine (mL/kg/hr) 665 (0.6)   Total Output 665   Net +5855         . sodium chloride 20 mL/hr at 03/11/20 1941  . sodium chloride    . [START ON 03/12/2020] sodium chloride    . sodium chloride    . albumin human    . amiodarone    . cefUROXime (ZINACEF)  IV    . dexmedetomidine (PRECEDEX) IV infusion    . epinephrine    . famotidine (PEPCID) IV    . insulin    . lactated ringers    . lactated ringers    . lactated ringers    . magnesium sulfate    . milrinone    . nitroGLYCERIN    . phenylephrine (NEO-SYNEPHRINE) Adult infusion    . potassium chloride    . vancomycin       Lab Results  Component Value Date   WBC 15.0 (H) 03/11/2020   HGB 9.5 (L) 03/11/2020   HCT 28.1 (L) 03/11/2020   PLT 137 (L) 03/11/2020   GLUCOSE 119 (H) 03/11/2020   CHOL 184 11/14/2019   TRIG 87 11/14/2019   HDL 70 11/14/2019   LDLCALC 98 11/14/2019   ALT 17 03/09/2020   AST 22 03/09/2020   NA 144 03/11/2020   K 3.6 03/11/2020   CL 100 03/11/2020   CREATININE 0.80 03/11/2020   BUN 13 03/11/2020   CO2 21 (L) 03/09/2020   TSH 4.060 03/12/2018   PSA 2.4 07/29/2014   INR 1.0  03/09/2020   HGBA1C 5.4 03/09/2020   Just back from the or, low chest tube output Low level of pressors  Labs pending    Grace Isaac MD  Beeper [phone] Office [phone] 03/11/2020 7:42 PM

## 2020-03-11 NOTE — Interval H&P Note (Signed)
History and Physical Interval Note:  03/11/2020 8:01 AM  Patrick Bell  has presented today for surgery, with the diagnosis of MR.  The various methods of treatment have been discussed with the patient and family. After consideration of risks, benefits and other options for treatment, the patient has consented to  Procedure(s): MINIMALLY INVASIVE MITRAL VALVE REPAIR (MVR) (Right) TRANSESOPHAGEAL ECHOCARDIOGRAM (TEE) (N/A) as a surgical intervention.  The patient's history has been reviewed, patient examined, no change in status, stable for surgery.  I have reviewed the patient's chart and labs.  Questions were answered to the patient's satisfaction.     Rexene Alberts

## 2020-03-11 NOTE — Anesthesia Postprocedure Evaluation (Signed)
Anesthesia Post Note  Patient: Patrick Bell  Procedure(s) Performed: TRANSESOPHAGEAL ECHOCARDIOGRAM (TEE) (N/A Esophagus) REPAIR OF ACUTE ASCENDING THORACIC AORTIC DISSECTION USING A HEMASHIELD PLATINUM 26MM STRAIGHT GRAFT AND A HEASHIELD PLATINUM 28X10MM SINGLE ARM GRAFT (N/A Chest) MITRAL VALVE REPAIR (MVR) USING MEMO 4D SIZE 32 RING (N/A Chest)     Patient location during evaluation: SICU Anesthesia Type: General Level of consciousness: sedated and patient remains intubated per anesthesia plan Pain management: pain level controlled Vital Signs Assessment: post-procedure vital signs reviewed and stable Respiratory status: patient remains intubated per anesthesia plan and patient on ventilator - see flowsheet for VS Cardiovascular status: stable (pt very stable on Milrinone, epi infusions) Postop Assessment: no apparent nausea or vomiting Anesthetic complications: no   No complications documented.  Last Vitals:  Vitals:   03/11/20 0805 03/11/20 1920  BP:    Pulse: 65 91  Resp: 19 14  Temp:    SpO2: 100% 96%    Last Pain:  Vitals:   03/11/20 0714  TempSrc:   PainSc: 0-No pain                 Gabriana Wilmott,E. Brealynn Contino

## 2020-03-11 NOTE — Brief Op Note (Addendum)
03/11/2020  6:21 PM  PATIENT:  Patrick Bell  79 y.o. male  PRE-OPERATIVE DIAGNOSIS:  Mitral regurgitation   POST-OPERATIVE DIAGNOSIS:  Mitral regurgitation, Aortic dissection  PROCEDURE:  Procedure(s): TRANSESOPHAGEAL ECHOCARDIOGRAM (TEE) (N/A) REPAIR OF ACUTE ASCENDING THORACIC AORTIC DISSECTION USING A HEMASHIELD PLATINUM 26MM STRAIGHT GRAFT AND A HEASHIELD PLATINUM 28X10MM SINGLE ARM GRAFT (N/A) MITRAL VALVE REPAIR (MVR) USING MEMO 4D SIZE 32 RING (N/A)  SURGEON:  Surgeon(s) and Role:    Rexene Alberts, MD - Primary    * Grace Isaac, MD - Assisting  ANESTHESIA:   general  EBL: 1500  BLOOD ADMINISTERED:  6 PRBC, 8 FFP, 4 Platelet, 4 cryo  DRAINS: 5 chest tubes including 2 mediastinum, 1 left pleural 2 right pleural  SPECIMEN:  No Specimen  DISPOSITION OF SPECIMEN:  N/A  COUNTS:  YES  TOURNIQUET:  * No tourniquets in log *  DICTATION: .Note written in EPIC  PLAN OF CARE: Admit to inpatient   PATIENT DISPOSITION:  ICU - intubated and hemodynamically stable.   Delay start of Pharmacological VTE agent (>24hrs) due to surgical blood loss or risk of bleeding: yes

## 2020-03-11 NOTE — Anesthesia Procedure Notes (Addendum)
Procedure Name: Intubation Date/Time: 03/11/2020 8:49 AM Performed by: Annye Asa, MD Pre-anesthesia Checklist: Patient identified, Emergency Drugs available, Suction available and Patient being monitored Patient Re-evaluated:Patient Re-evaluated prior to induction Oxygen Delivery Method: Circle System Utilized Preoxygenation: Pre-oxygenation with 100% oxygen Induction Type: IV induction Ventilation: Mask ventilation without difficulty Laryngoscope Size: Mac and 4 Grade View: Grade II Tube type: Oral Endobronchial tube: Left, Double lumen EBT, EBT position confirmed by fiberoptic bronchoscope and EBT position confirmed by auscultation and 39 Fr Number of attempts: 1 Airway Equipment and Method: Stylet and Oral airway Placement Confirmation: ETT inserted through vocal cords under direct vision,  positive ETCO2 and breath sounds checked- equal and bilateral Secured at: 29 cm Tube secured with: Tape Dental Injury: Teeth and Oropharynx as per pre-operative assessment

## 2020-03-11 NOTE — Progress Notes (Signed)
Recruitment on ventilator done per MD request. Pt tolerated for One minute and BP dropped to 71/45 RT placed pt back on full support and BP came back up to 107/62.

## 2020-03-11 NOTE — Progress Notes (Signed)
Pt. Has small areas on left groin that are red and looks like razor burn. Ask nurse Tech, Amber if the skin looked like that prior to his prep and she wasn't sure. Pt. Doesn't remember any type of rash.

## 2020-03-11 NOTE — Op Note (Signed)
CARDIOTHORACIC SURGERY OPERATIVE NOTE  Date of Procedure:  03/11/2020  Preoperative Diagnosis:   Severe Mitral Regurgitation  Postoperative Diagnosis:   Severe Mitral Regurgitation  Acute Type A Aortic Dissection  Procedure:    Mitral Valve Repair  Complex valvuloplasty including artificial Gore-tex neochord placement x4  Suture plication of posterior leaflet and posteromedial commissure  Sorin Memo 4D Ring Annuloplasty (size 45mm, catalog # [id number], serial # [id number])   Repair Acute Type A Aortic Dissection  Resuspension of native aortic valve  Supracoronary straight graft repair of ascending aorta (Hemashield Platinum size 26 and 28 mm)  Open distal anastomosis    Surgeon: Valentina Gu. Roxy Manns, MD  Assistant: Lilia Argue. Servando Snare, MD  Anesthesia: Midge Minium, MD  Operative Findings:  Fibroelastic deficiency type myxomatous disease  Multiple elongated and ruptured primary chordae tendinae  Type II dysfunction causing severe mitral regurgitation  Trace central aortic insufficiency  Normal left ventricular systolic function  Acute type A aortic dissection complicated by rupture of aortic root immediately upon initiation of cardiopulmonary bypass  Aortic dissection repair with reconstruction of the aortic root, resuspension of the native aortic valve, supra coronary straight graft replacement of the ascending aorta and open distal aortic anastomosis  No residual mitral regurgitation after successful valve repair  Unchanged trace central aortic insufficiency after resuspension of aortic valve  Severe coagulopathy after separation from cardiopulmonary bypass                  BRIEF CLINICAL NOTE AND INDICATIONS FOR SURGERY  Patient is an 79 year old male with history of hypertension, obstructive sleep apnea, hyperlipidemia, GE reflux disease, and remote history of heavy tobacco use who has been referred for surgical consultation to discuss treatment  options for management of recently discovered mitral valve prolapse with severe mitral regurgitation.  Patient has remained physically active and reasonably healthy all of his adult life. He has been treated for essential hypertension and obstructive sleep apnea. Several years ago he tried using CPAP but has stopped since then. He reports a 6 to 33-month history of change in exercise tolerance with the development of exertional shortness of breath that typically occurs only with more strenuous physical exertion. He has blamed this on a tendency that he has had to be less physically active. He was recently noted to have a new prominent systolic murmur on physical exam by his primary care physician. Transthoracic echocardiogram was performed demonstrating normal left ventricular function with mitral valve prolapse and severe mitral regurgitation. The patient was referred for cardiology consultation and recently evaluated by Dr. Claiborne Billings. The patient subsequently underwent TEE which confirmed the presence of mitral valve prolapse with flail segment involving a portion of the posterior leaflet and severe mitral regurgitation. Cardiothoracic surgical consultation was requested.  The patient has been seen in consultation and counseled at length regarding the indications, risks and potential benefits of surgery.  All questions have been answered, and the patient provides full informed consent for the operation as described.    DETAILS OF THE OPERATIVE PROCEDURE  Preparation:  The patient is brought to the operating room on the above mentioned date and central monitoring was established by the anesthesia team including placement of Swan-Ganz catheter through the left internal jugular vein.  A radial arterial line is placed. The patient is placed in the supine position on the operating table.  Intravenous antibiotics are administered. General endotracheal anesthesia is induced uneventfully. The patient is  initially intubated using a dual lumen endotracheal tube.  A Foley  catheter is placed.  Baseline transesophageal echocardiogram was performed.  Findings were notable for myxomatous degenerative disease of the mitral valve with severe prolapse involving the P2 and the P3 scallops of the posterior leaflet of the mitral valve.  There were ruptured primary chordae tendinae from the P3 segment and elongated cords from the P2 segment.  There was severe mitral regurgitation with flow reversal in the pulmonary veins.  There was normal left ventricular size and systolic function.  There was mild left atrial enlargement.  Right ventricular size and function was normal.  There was trace central aortic insufficiency.  A soft roll is placed behind the patient's left scapula and the neck gently extended and turned to the left.   The patient's right neck, chest, abdomen, both groins, and both lower extremities are prepared and draped in a sterile manner. A time out procedure is performed.   Percutaneous Vascular Access:  Percutaneous arterial and venous access were obtained on the right side.  Using ultrasound guidance the right common femoral vein was cannulated using the Seldinger technique a pair of Perclose vascular closure devises were placed at opposing 30 degree angles in the femoral vein, after which time an 8 French sheath inserted.  The right common femoral artery was cannulated using a micropuncture wire and sheath.  A pair of Perclose vascular closure devices were placed at opposing 30 degree angles in the femoral artery, and a 8 French sheath inserted.  The right internal jugular vein was cannulated  using ultrasound guidance and an 8 French sheath inserted.     Surgical Approach:  A right miniature anterolateral thoracotomy incision is performed. The incision is placed just lateral to and superior to the right nipple. The pectoralis major muscle is retracted medially and completely preserved. The right  pleural space is entered through the 3rd intercostal space. A soft tissue retractor is placed.  Two 11 mm ports are placed through separate stab incisions inferiorly. The right pleural space is insufflated continuously with carbon dioxide gas through the posterior port during the remainder of the operation.  A pledgeted sutures placed through the dome of the right hemidiaphragm and retracted inferiorly to facilitate exposure.  A longitudinal incision is made in the pericardium 3 cm anterior to the phrenic nerve and silk traction sutures are placed on either side of the incision for exposure.  A mixture of Exparel liposomal bupivacaine (20 mL) and 0.5% bupivacaine (30 mL) is utilized to create an intercostal nerve block for postoperative analgesia.  The mixture is injected under direct vision into the intercostal neurovascular bundles posteriorly to cover the second through the sixth intercostal nerve roots.     Extracorporeal Cardiopulmonary Bypass and Myocardial Protection:   The patient was heparinized systemically.  The right common femoral vein is cannulated through the venous sheath and a guidewire advanced into the right atrium using TEE guidance.  The femoral vein cannulated using a 22 Fr long femoral venous cannula.  The right common femoral artery is cannulated through the arterial sheath and a guidewire advanced into the descending thoracic aorta using TEE guidance.  Femoral artery is cannulated with a 18 French femoral arterial cannula.  The right internal jugular vein is cannulated through the venous sheath and a guidewire advanced into the right atrium.  The internal jugular vein is cannulated using a 15 [place] pediatric femoral venous cannula.   Adequate heparinization is verified.   The entire pre-bypass portion of the operation was notable for stable hemodynamics.  Cardiopulmonary bypass was  begun.  Vacuum assist venous drainage is utilized.   Immediately following initiation of  cardiopulmonary bypass there developed large amount of arterial bleeding from around the ascending aorta.  Transesophageal echocardiogram verified the presence of aortic dissection involving the proximal ascending and the descending thoracic aorta.  Median sternotomy incision was performed.  The pericardium is opened.  The ascending aorta is obviously dissected acutely and there is massive bleeding from the aortic root where the proximal root had ruptured.  Elevated line pressures are noted on the arterial cannula.  A decision is made to replace the new cannula centrally to restore antegrade perfusion.    The proximal innominate artery is dissected away from associated structures and concentric pursestring sutures are placed on its anterior surface.  The innominate artery is cannulated using a 20 French femoral arterial cannula placed using Seldinger technique.  Transesophageal echocardiogram is utilized to verify visualization of guidewire extending into the true lumen of the descending thoracic aorta.  Following placement of the cannula in the innominate artery cardiopulmonary bypass is temporarily discontinued and switched to the central cannula.  A retrograde cardioplegia cannula is placed through the right atrium into the coronary sinus.  A small incision is made into the left atrium through the interatrial groove posteriorly and a sump drain is passed through the mitral valve to serve as a left ventricular vent.  The proximal aorta is dissected away from the pulmonary artery.  The patient is cooled to 26C systemic temperature.  The aortic cross clamp is applied just below the takeoff of the innominate artery and cardioplegia is delivered retrograd through the coronary sinus using modified [place] cold blood cardioplegia ([place] blood cardioplegia protocol).   The initial cardioplegic arrest is rapid with early diastolic arrest.  Repeat doses of cardioplegia are administered at 90 minutes and  every 30 minutes thereafter through the coronary sinus catheter in order to maintain completely flat electrocardiogram.  Myocardial protection was felt to be excellent.   Repair of Ascending Thoracic Aortic Dissection:  The ascending aorta is transected proximally.  The dissection is circumferential distally near the cross-clamp with a free-floating true lumen.  At the aortic root the dissection involves primarily the noncoronary sinus of Valsalva.  The adventitial layer is very thin-walled and has ruptured at the base of the noncoronary sinus of Valsalva and beneath the commissure between the right and noncoronary leaflets of the aortic valve.  The aortic valve itself is free-floating and incompetent.  The remainder of the right sinus Valsalva and the entire left sinus of Valsalva appear intact and without dissection.  The aorta is trimmed back to the level of the sinotubular junction.  The aortic root is reconstructed and the native aortic valve resuspended.  This is performed with pledgeted sutures at the apex of the right-noncoronary commissure and the left-noncoronary commissure.  A sandwich of Teflon felt is placed in between the layers of the aortic wall.  Sinotubular junction is measured 26 mm diameter.  A 26 mm Hemashield platinum vascular graft is utilized for replacement of the proximal aorta.  The proximal suture line is performed with interrupted 2-0 Ethibond horizontal mattress pledgeted sutures followed by running 4-0 Prolene.  Attention is now directed to the distal ascending aorta.  The patient is placed in steep Trendelenburg position, given intravenous steroids, and high-dose etomidate.  Cardiopulmonary bypass is temporarily discontinued and the aortic cross-clamp was removed.  The interior of the aortic arch is examined and there are no signs of reentry tears.  The innominate artery cannula is removed.  The distal portion of the ascending aorta is replaced with a 28 mm Hemashield  platinum vascular graft with a 10 mm single side arm.  The distal anastomosis is performed between this graft and the distal ascending aorta just below the takeoff of the innominate artery with running 3-0 Prolene suture using a Teflon felt strip to buttress the suture line.  Once completed cardiopulmonary bypass is reinstituted through the side arm of the vascular graft after a total circulatory arrest time of 11 minutes.  The distal suture line and the innominate artery cannulation site are inspected for meticulous hemostasis.   Mitral Valve Repair:  A left atriotomy incision was performed through the interatrial groove and extended partially across the back wall of the left atrium after opening the oblique sinus inferiorly.  The mitral valve is exposed using a self-retaining retractor.  The mitral valve was inspected and notable for fibroelastic deficiency type myxomatous degenerative disease.  There are multiple (2) ruptured primary chordae tendinae from the P3 segment of the posterior leaflet and multiple elongated primary chordae tendinae from the P3 segment and the commissural leaflet at the posterior medial commissure.  Interrupted 2-0 Ethibond horizontal mattress sutures are placed circumferentially around the entire mitral valve annulus. The sutures will ultimately be utilized for ring annuloplasty, and at this juncture there are utilized to suspend the valve symmetrically.  Artificial neochord placement was performed using Chord-X multi-strand CV-4 Goretex pre-measured loops.  The appropriate cord length (19mm) was measured from corresponding normal length primary cords from the P1 segment of the posterior leaflet. The papillary muscle suture of a Chord-X multi-strand suture was placed through the head of the posterior medial papillary muscle in a horizontal mattress fashion and tied over Teflon felt pledgets. Each of the three pre-measured loops were then reimplanted into the free margin of the  P2 and P3 segments of the posterior leaflet on the posterior side of midline.    The valve was tested with saline and appeared competent even without ring annuloplasty complete. The valve was sized to a 32 mm annuloplasty ring, based upon the transverse distance between the left and right commissures and the height of the anterior leaflet, corresponding to a size just slightly larger than the overall surface area of the anterior leaflet.  A Sorin Memo 4D annuloplasty ring (size 10mm, catalog #[id number], serial [id number]) was secured in place uneventfully. All ring sutures were secured using a Cor-knot device.    The valve was tested with saline and appeared competent although there was still some prolapse involving a portion of the P3 segment and the commissural leaflet at the posterior medial commissure.  There was also some restriction of the P2 segment in the midline.  One of the 3 pairs of neocords attached in the middle of the P2 segment the posterior leaflet was removed to relieve the restriction.  Suture plication was performed closing the cleft between P2 and P3, between P3 and the commissural leaflet, and between the commissural leaflet and the anterior leaflet using interrupted everting 4-0 Prolene sutures.   The valve is again retested with saline.  There is no residual leak. There was a broad, symmetrical line of coaptation of the anterior and posterior leaflet.  Rewarming is begun.   Procedure Completion:  The left atriotomy was closed using a 2-layer closure of running 3-0 Prolene suture after placing a sump drain across the mitral valve to serve as a left ventricular vent.  Attention is redirected to the aortic graft.  The proximal and distal segments of graft are each trimmed and beveled to an appropriate length and sewn together in the middle with running 4-0 Prolene suture.  A small hole was placed in the anterior surface of the ascending aortic graft to service of vent.  One final dose  of warm retrograde "reanimation dose" cardioplegia was administered retrograde through the coronary sinus catheter while all air was evacuated through the aortic root.  The aortic cross clamp was removed after a total cross clamp time of 223 minutes.  The aortic root and the entire aortic graft are carefully inspected for hemostasis.  Temporarily the lungs were ventilated and the heart allowed to fill to evacuate any residual air.  The vent hole in the aortic graft is closed with 4-0 Prolene suture.  The retrograde cannula is removed.  Epicardial pacing wires are fixed to the inferior wall of the right ventricule and to the right atrial appendage. The patient is rewarmed to 37C temperature. The left ventricular vent is removed.  The patient is weaned and disconnected from cardiopulmonary bypass.  The patient's rhythm at separation from bypass was sinus.  Atrial pacing was employed to increase the heart rate.  The patient was weaned from bypass on low-dose milrinone at 0.25 mcg/kg/min.  Total cardiopulmonary bypass time for the operation was 286 minutes.  Followup transesophageal echocardiogram performed after separation from bypass revealed a well-seated annuloplasty ring in the mitral position with a normal functioning mitral valve. There was no residual leak.  Left ventricular function was normal with no significant wall motion abnormalities.  The mean gradient across the mitral valve was estimated to be 2 mmHg. the aortic valve was functioning normally.  There remains trace central aortic insufficiency, unchanged from preoperatively.  Mean transvalvular gradient across aortic valve was 7 mmHg.  The femoral arterial and venous cannulas were removed and all Perclose sutures secured.  Manual pressure was maintained while Protamine was administered.  The right internal jugular cannula was removed and manual pressure held on the neck and groin for 30 minutes.    There was severe coagulopathy after reversal  of heparin with protamine.  The patient received a total of 4 packs adult platelets 4 packs cryoprecipitate and 8 units fresh frozen plasma due to coagulopathy and thrombocytopenia after separation from cardiopulmonary bypass and reversal of heparin with protamine.  After administration of all of the blood products thromboelastogram was performed and confirmed persistent severe coagulopathy.  The patient was administered a single dose of recombinant factor VII.  After considerable period of time in the operating room the patient's coagulopathy slowly improved.  The median sternotomy incision was temporarily packed with gauze all attention redirected to the minithoracotomy incision.  The right pleural space is irrigated with saline solution and inspected for hemostasis.  The right pleural space was drained using a 32 French Bard drain placed through the posterior port incision. The miniature thoracotomy incision was closed in multiple layers in routine fashion.   While the patient's mediastinum was packed the patient developed a brief period of hypotension and suffered a brief episode of VF arrest requiring cardioversion.  Once the packs were removed and a retractor replaced normal hemodynamics recovered and the patient's left ventricular function returned to normal with no new abnormalities.  The remainder of the post-bypass portion of the operation was notable for stable rhythm and hemodynamics.  The patient received 8 units packed red blood cells during the procedure due to  acute blood loss anemia exacerbated by hemodilution during cardiopulmonary bypass.   Disposition:  The patient tolerated the procedure remarkably well.  The patient reintubated using a single lumen endotracheal tube and subsequently transported to the surgical intensive care unit in stable condition. There were no intraoperative complications. All sponge instrument and needle counts are verified correct at completion of the  operation.     Valentina Gu. Roxy Manns MD 03/11/2020 6:32 PM

## 2020-03-12 ENCOUNTER — Encounter (HOSPITAL_COMMUNITY): Payer: Self-pay | Admitting: Thoracic Surgery (Cardiothoracic Vascular Surgery)

## 2020-03-12 ENCOUNTER — Inpatient Hospital Stay (HOSPITAL_COMMUNITY): Payer: Medicare Other

## 2020-03-12 ENCOUNTER — Other Ambulatory Visit: Payer: Self-pay

## 2020-03-12 DIAGNOSIS — Z9889 Other specified postprocedural states: Secondary | ICD-10-CM

## 2020-03-12 DIAGNOSIS — I340 Nonrheumatic mitral (valve) insufficiency: Secondary | ICD-10-CM

## 2020-03-12 LAB — BPAM PLATELET PHERESIS
Blood Product Expiration Date: 202112092359
Blood Product Expiration Date: 202112092359
Blood Product Expiration Date: 202112102359
Blood Product Expiration Date: 202112102359
ISSUE DATE / TIME: 202112081316
ISSUE DATE / TIME: 202112081418
ISSUE DATE / TIME: 202112081603
ISSUE DATE / TIME: 202112081702
Unit Type and Rh: 6200
Unit Type and Rh: 5100
Unit Type and Rh: 6200
Unit Type and Rh: 6200

## 2020-03-12 LAB — POCT I-STAT 7, (LYTES, BLD GAS, ICA,H+H)
Acid-Base Excess: 1 mmol/L (ref 0.0–2.0)
Acid-base deficit: 1 mmol/L (ref 0.0–2.0)
Acid-base deficit: 2 mmol/L (ref 0.0–2.0)
Bicarbonate: 22.9 mmol/L (ref 20.0–28.0)
Bicarbonate: 23.2 mmol/L (ref 20.0–28.0)
Bicarbonate: 26.1 mmol/L (ref 20.0–28.0)
Calcium, Ion: 0.98 mmol/L — ABNORMAL LOW (ref 1.15–1.40)
Calcium, Ion: 1 mmol/L — ABNORMAL LOW (ref 1.15–1.40)
Calcium, Ion: 1.02 mmol/L — ABNORMAL LOW (ref 1.15–1.40)
HCT: 25 % — ABNORMAL LOW (ref 39.0–52.0)
HCT: 25 % — ABNORMAL LOW (ref 39.0–52.0)
HCT: 25 % — ABNORMAL LOW (ref 39.0–52.0)
Hemoglobin: 8.5 g/dL — ABNORMAL LOW (ref 13.0–17.0)
Hemoglobin: 8.5 g/dL — ABNORMAL LOW (ref 13.0–17.0)
Hemoglobin: 8.5 g/dL — ABNORMAL LOW (ref 13.0–17.0)
O2 Saturation: 97 %
O2 Saturation: 95 %
O2 Saturation: 100 %
Patient temperature: 36.4
Patient temperature: 36.6
Patient temperature: 36.5
Potassium: 3.8 mmol/L (ref 3.5–5.1)
Potassium: 3.7 mmol/L (ref 3.5–5.1)
Potassium: 3.7 mmol/L (ref 3.5–5.1)
Sodium: 142 mmol/L (ref 135–145)
Sodium: 142 mmol/L (ref 135–145)
Sodium: 142 mmol/L (ref 135–145)
TCO2: 24 mmol/L (ref 22–32)
TCO2: 24 mmol/L (ref 22–32)
TCO2: 27 mmol/L (ref 22–32)
pCO2 arterial: 35.1 mmHg (ref 32.0–48.0)
pCO2 arterial: 37.2 mmHg (ref 32.0–48.0)
pCO2 arterial: 44.6 mmHg (ref 32.0–48.0)
pH, Arterial: 7.42 (ref 7.350–7.450)
pH, Arterial: 7.401 (ref 7.350–7.450)
pH, Arterial: 7.373 (ref 7.350–7.450)
pO2, Arterial: 85 mmHg (ref 83.0–108.0)
pO2, Arterial: 75 mmHg — ABNORMAL LOW (ref 83.0–108.0)
pO2, Arterial: 195 mmHg — ABNORMAL HIGH (ref 83.0–108.0)

## 2020-03-12 LAB — PREPARE CRYOPRECIPITATE
Unit division: 0
Unit division: 0
Unit division: 0

## 2020-03-12 LAB — PREPARE FRESH FROZEN PLASMA
Unit division: 0
Unit division: 0
Unit division: 0
Unit division: 0
Unit division: 0
Unit division: 0
Unit division: 0

## 2020-03-12 LAB — CBC
HCT: 28.1 % — ABNORMAL LOW (ref 39.0–52.0)
HCT: 26.9 % — ABNORMAL LOW (ref 39.0–52.0)
HCT: 27.7 % — ABNORMAL LOW (ref 39.0–52.0)
Hemoglobin: 9.5 g/dL — ABNORMAL LOW (ref 13.0–17.0)
Hemoglobin: 9.4 g/dL — ABNORMAL LOW (ref 13.0–17.0)
Hemoglobin: 9.5 g/dL — ABNORMAL LOW (ref 13.0–17.0)
MCH: 29.5 pg (ref 26.0–34.0)
MCH: 30.2 pg (ref 26.0–34.0)
MCH: 30 pg (ref 26.0–34.0)
MCHC: 33.8 g/dL (ref 30.0–36.0)
MCHC: 34.9 g/dL (ref 30.0–36.0)
MCHC: 34.3 g/dL (ref 30.0–36.0)
MCV: 87.3 fL (ref 80.0–100.0)
MCV: 86.5 fL (ref 80.0–100.0)
MCV: 87.4 fL (ref 80.0–100.0)
Platelets: 137 10*3/uL — ABNORMAL LOW (ref 150–400)
Platelets: 113 10*3/uL — ABNORMAL LOW (ref 150–400)
Platelets: 122 10*3/uL — ABNORMAL LOW (ref 150–400)
RBC: 3.22 MIL/uL — ABNORMAL LOW (ref 4.22–5.81)
RBC: 3.11 MIL/uL — ABNORMAL LOW (ref 4.22–5.81)
RBC: 3.17 MIL/uL — ABNORMAL LOW (ref 4.22–5.81)
RDW: 14.5 % (ref 11.5–15.5)
RDW: 15.3 % (ref 11.5–15.5)
RDW: 15.7 % — ABNORMAL HIGH (ref 11.5–15.5)
WBC: 15 10*3/uL — ABNORMAL HIGH (ref 4.0–10.5)
WBC: 11.4 10*3/uL — ABNORMAL HIGH (ref 4.0–10.5)
WBC: 19.1 10*3/uL — ABNORMAL HIGH (ref 4.0–10.5)
nRBC: 0 % (ref 0.0–0.2)
nRBC: 0 % (ref 0.0–0.2)
nRBC: 0 % (ref 0.0–0.2)

## 2020-03-12 LAB — BPAM FFP
Blood Product Expiration Date: 202112082359
Blood Product Expiration Date: 202112082359
Blood Product Expiration Date: 202112082359
Blood Product Expiration Date: 202112082359
Blood Product Expiration Date: 202112132359
Blood Product Expiration Date: 202112132359
Blood Product Expiration Date: 202112132359
Blood Product Expiration Date: 202112132359
Blood Product Expiration Date: 202112132359
Blood Product Expiration Date: 202112132359
ISSUE DATE / TIME: 202112081323
ISSUE DATE / TIME: 202112081323
ISSUE DATE / TIME: 202112081323
ISSUE DATE / TIME: 202112081323
ISSUE DATE / TIME: 202112081602
ISSUE DATE / TIME: 202112081602
ISSUE DATE / TIME: 202112081625
ISSUE DATE / TIME: 202112081625
ISSUE DATE / TIME: 202112082016
ISSUE DATE / TIME: 202112082016
Unit Type and Rh: 6200
Unit Type and Rh: 6200
Unit Type and Rh: 6200
Unit Type and Rh: 6200
Unit Type and Rh: 6200
Unit Type and Rh: 6200
Unit Type and Rh: 600
Unit Type and Rh: 6200
Unit Type and Rh: 6200
Unit Type and Rh: 6200

## 2020-03-12 LAB — PREPARE PLATELET PHERESIS
Unit division: 0
Unit division: 0
Unit division: 0
Unit division: 0

## 2020-03-12 LAB — BASIC METABOLIC PANEL
Anion gap: 14 (ref 5–15)
Anion gap: 16 — ABNORMAL HIGH (ref 5–15)
BUN: 15 mg/dL (ref 8–23)
BUN: 19 mg/dL (ref 8–23)
CO2: 23 mmol/L (ref 22–32)
CO2: 22 mmol/L (ref 22–32)
Calcium: 7.5 mg/dL — ABNORMAL LOW (ref 8.9–10.3)
Calcium: 6.9 mg/dL — ABNORMAL LOW (ref 8.9–10.3)
Chloride: 105 mmol/L (ref 98–111)
Chloride: 103 mmol/L (ref 98–111)
Creatinine, Ser: 1.33 mg/dL — ABNORMAL HIGH (ref 0.61–1.24)
Creatinine, Ser: 1.76 mg/dL — ABNORMAL HIGH (ref 0.61–1.24)
GFR, Estimated: 54 mL/min — ABNORMAL LOW (ref >60)
GFR, Estimated: 39 mL/min — ABNORMAL LOW (ref >60)
Glucose, Bld: 123 mg/dL — ABNORMAL HIGH (ref 70–99)
Glucose, Bld: 189 mg/dL — ABNORMAL HIGH (ref 70–99)
Potassium: 3.8 mmol/L (ref 3.5–5.1)
Potassium: 3.7 mmol/L (ref 3.5–5.1)
Sodium: 142 mmol/L (ref 135–145)
Sodium: 141 mmol/L (ref 135–145)

## 2020-03-12 LAB — BPAM CRYOPRECIPITATE
Blood Product Expiration Date: 202112082015
Blood Product Expiration Date: 202112082015
Blood Product Expiration Date: 202112082220
ISSUE DATE / TIME: 202112081437
ISSUE DATE / TIME: 202112081437
ISSUE DATE / TIME: 202112081641
Unit Type and Rh: 6200
Unit Type and Rh: 6200
Unit Type and Rh: 6200

## 2020-03-12 LAB — POTASSIUM: Potassium: 4 mmol/L (ref 3.5–5.1)

## 2020-03-12 LAB — GLUCOSE, CAPILLARY
Glucose-Capillary: 119 mg/dL — ABNORMAL HIGH (ref 70–99)
Glucose-Capillary: 120 mg/dL — ABNORMAL HIGH (ref 70–99)
Glucose-Capillary: 123 mg/dL — ABNORMAL HIGH (ref 70–99)
Glucose-Capillary: 123 mg/dL — ABNORMAL HIGH (ref 70–99)
Glucose-Capillary: 125 mg/dL — ABNORMAL HIGH (ref 70–99)
Glucose-Capillary: 131 mg/dL — ABNORMAL HIGH (ref 70–99)
Glucose-Capillary: 132 mg/dL — ABNORMAL HIGH (ref 70–99)
Glucose-Capillary: 136 mg/dL — ABNORMAL HIGH (ref 70–99)
Glucose-Capillary: 145 mg/dL — ABNORMAL HIGH (ref 70–99)
Glucose-Capillary: 169 mg/dL — ABNORMAL HIGH (ref 70–99)

## 2020-03-12 LAB — MAGNESIUM
Magnesium: 3 mg/dL — ABNORMAL HIGH (ref 1.7–2.4)
Magnesium: 2.5 mg/dL — ABNORMAL HIGH (ref 1.7–2.4)

## 2020-03-12 LAB — COOXEMETRY PANEL
Carboxyhemoglobin: 0.9 % (ref 0.5–1.5)
Methemoglobin: 1.4 % (ref 0.0–1.5)
O2 Saturation: 67.8 %
Total hemoglobin: 9.6 g/dL — ABNORMAL LOW (ref 12.0–16.0)

## 2020-03-12 LAB — POCT I-STAT, CHEM 8
BUN: 21 mg/dL (ref 8–23)
Calcium, Ion: 1 mmol/L — ABNORMAL LOW (ref 1.15–1.40)
Chloride: 101 mmol/L (ref 98–111)
Creatinine, Ser: 1.6 mg/dL — ABNORMAL HIGH (ref 0.61–1.24)
Glucose, Bld: 188 mg/dL — ABNORMAL HIGH (ref 70–99)
HCT: 26 % — ABNORMAL LOW (ref 39.0–52.0)
Hemoglobin: 8.8 g/dL — ABNORMAL LOW (ref 13.0–17.0)
Potassium: 3.7 mmol/L (ref 3.5–5.1)
Sodium: 142 mmol/L (ref 135–145)
TCO2: 23 mmol/L (ref 22–32)

## 2020-03-12 LAB — SURGICAL PATHOLOGY

## 2020-03-12 MED ORDER — ORAL CARE MOUTH RINSE
15.0000 mL | OROMUCOSAL | Status: DC
  Administered 2020-03-12 (×2): 15 mL via OROMUCOSAL

## 2020-03-12 MED ORDER — FUROSEMIDE 10 MG/ML IJ SOLN
40.0000 mg | Freq: Two times a day (BID) | INTRAMUSCULAR | Status: DC
  Administered 2020-03-12 (×2): 40 mg via INTRAVENOUS
  Filled 2020-03-12 (×2): qty 4

## 2020-03-12 MED ORDER — CHLORHEXIDINE GLUCONATE CLOTH 2 % EX PADS
6.0000 | MEDICATED_PAD | Freq: Every day | CUTANEOUS | Status: DC
  Administered 2020-03-12 – 2020-03-31 (×19): 6 via TOPICAL

## 2020-03-12 MED ORDER — SODIUM CHLORIDE 0.9% FLUSH: 10.0000 mL | Freq: Two times a day (BID) | INTRAVENOUS | Status: DC

## 2020-03-12 MED ORDER — POTASSIUM CHLORIDE 10 MEQ/50ML IV SOLN
10.0000 meq | INTRAVENOUS | Status: DC
Start: 2020-03-12 — End: 2020-03-12

## 2020-03-12 MED ORDER — ASPIRIN EC 81 MG PO TBEC
81.0000 mg | DELAYED_RELEASE_TABLET | Freq: Every day | ORAL | Status: DC
  Administered 2020-03-13 – 2020-03-16 (×4): 81 mg via ORAL
  Filled 2020-03-12 (×4): qty 1

## 2020-03-12 MED ORDER — POTASSIUM CHLORIDE 10 MEQ/50ML IV SOLN
10.0000 meq | INTRAVENOUS | Status: AC
  Administered 2020-03-12 (×3): 10 meq via INTRAVENOUS
  Filled 2020-03-12 (×3): qty 50

## 2020-03-12 MED ORDER — ALBUMIN HUMAN 5 % IV SOLN
12.5000 g | Freq: Once | INTRAVENOUS | Status: AC
  Administered 2020-03-12: 12.5 g via INTRAVENOUS
  Filled 2020-03-12: qty 250

## 2020-03-12 MED ORDER — CHLORHEXIDINE GLUCONATE 0.12% ORAL RINSE (MEDLINE KIT)
15.0000 mL | Freq: Two times a day (BID) | OROMUCOSAL | Status: DC
  Administered 2020-03-12: 15 mL via OROMUCOSAL

## 2020-03-12 MED ORDER — ALBUMIN HUMAN 5 % IV SOLN: 12.5000 g | Freq: Once | INTRAVENOUS | Status: DC

## 2020-03-12 MED ORDER — SODIUM CHLORIDE 0.9% FLUSH: 10.0000 mL | INTRAVENOUS | Status: DC | PRN

## 2020-03-12 MED ORDER — POTASSIUM CHLORIDE 10 MEQ/100ML IV SOLN
10.0000 meq | INTRAVENOUS | Status: AC
  Administered 2020-03-12 (×2): 10 meq via INTRAVENOUS
  Filled 2020-03-12 (×2): qty 100

## 2020-03-12 MED ORDER — ASPIRIN EC 325 MG PO TBEC
325.0000 mg | DELAYED_RELEASE_TABLET | Freq: Every day | ORAL | Status: AC
  Administered 2020-03-12: 325 mg via ORAL
  Filled 2020-03-12: qty 1

## 2020-03-12 MED ORDER — INSULIN ASPART 100 UNIT/ML ~~LOC~~ SOLN
0.0000 [IU] | SUBCUTANEOUS | Status: DC
  Administered 2020-03-12 (×2): 2 [IU] via SUBCUTANEOUS
  Administered 2020-03-12: 4 [IU] via SUBCUTANEOUS
  Administered 2020-03-13: 2 [IU] via SUBCUTANEOUS

## 2020-03-12 MED ORDER — WARFARIN SODIUM 2.5 MG PO TABS
2.5000 mg | ORAL_TABLET | Freq: Every day | ORAL | Status: DC
  Administered 2020-03-13 – 2020-03-15 (×3): 2.5 mg via ORAL
  Filled 2020-03-12 (×3): qty 1

## 2020-03-12 MED ORDER — WARFARIN - PHYSICIAN DOSING INPATIENT: Freq: Every day | Status: DC

## 2020-03-12 MED FILL — Potassium Chloride Inj 2 mEq/ML: INTRAVENOUS | Qty: 40 | Status: AC

## 2020-03-12 MED FILL — Cefuroxime Sodium For Inj 750 MG: INTRAMUSCULAR | Qty: 750 | Status: AC

## 2020-03-12 MED FILL — Heparin Sodium (Porcine) Inj 1000 Unit/ML: INTRAMUSCULAR | Qty: 30 | Status: AC

## 2020-03-12 MED FILL — Lidocaine HCl Local Preservative Free (PF) Inj 2%: INTRAMUSCULAR | Qty: 15 | Status: AC

## 2020-03-12 NOTE — Plan of Care (Signed)
  Problem: Cardiac: Goal: Will achieve and/or maintain hemodynamic stability Outcome: Progressing   Problem: Clinical Measurements: Goal: Postoperative complications will be avoided or minimized Outcome: Progressing   Problem: Urinary Elimination: Goal: Ability to achieve and maintain adequate renal perfusion and functioning will improve Outcome: Progressing

## 2020-03-12 NOTE — Progress Notes (Addendum)
[facility] DAILY ICU PROGRESS NOTE                   [address]            [place] [address]          [phone]   1 Day Post-Op Procedure(s) (LRB): TRANSESOPHAGEAL ECHOCARDIOGRAM (TEE) (N/A) REPAIR OF ACUTE ASCENDING THORACIC AORTIC DISSECTION USING A HEMASHIELD PLATINUM 26MM STRAIGHT GRAFT AND A HEASHIELD PLATINUM 28X10MM SINGLE ARM GRAFT (N/A) MITRAL VALVE REPAIR (MVR) USING MEMO 4D SIZE 32 RING (N/A)  Total Length of Stay:  LOS: 1 day   Subjective: Intubated, opens his eyes and nods head in response.   Objective: Vital signs in last 24 hours: Temp:  [95 F (35 C)-97.9 F (36.6 C)] 97.52 F (36.4 C) (12/09 0700) Pulse Rate:  [72-91] 80 (12/09 0805) Cardiac Rhythm: Normal sinus rhythm (12/09 0024) Resp:  [14-24] 18 (12/09 0805) BP: (93-116)/(52-69) 98/52 (12/09 0805) SpO2:  [96 %-100 %] 100 % (12/09 0805) Arterial Line BP: (87-116)/(47-67) 96/49 (12/09 0700) FiO2 (%):  [40 %-60 %] 40 % (12/09 0805) Weight:  [101.6 kg] 101.6 kg (12/09 0553)  Filed Weights   03/11/20 0653 03/12/20 0553  Weight: 83.9 kg 101.6 kg    Weight change: 17.7 kg   Hemodynamic parameters for last 24 hours: PAP: (24-36)/(13-22) 29/17 CO:  [4 L/min-5.2 L/min] 5.2 L/min CI:  [2 L/min/m2-2.6 L/min/m2] 2.6 L/min/m2  Intake/Output from previous day: 12/08 0701 - 12/09 0700 In: 9983.6 [I.V.:4551.9; TMLYY:5035; NG/GT:90; IV Piggyback:715.8] Out: 2250 [Urine:1400; Emesis/NG output:250; Chest Tube:600]  Intake/Output this shift: Total I/O In: -  Out: 300 [Urine:140; Chest Tube:160]  Current Meds: Scheduled Meds: . sodium chloride   Intravenous Once  . acetaminophen  1,000 mg Oral Q6H   Or  . acetaminophen (TYLENOL) oral liquid 160 mg/5 mL  1,000 mg Per Tube Q6H  . aspirin EC  325 mg Oral Daily   Or  . aspirin  324 mg Per Tube Daily  . bisacodyl  10 mg Oral Daily   Or  . bisacodyl  10 mg Rectal Daily  . chlorhexidine gluconate (MEDLINE KIT)  15 mL Mouth Rinse BID  .  Chlorhexidine Gluconate Cloth  6 each Topical Daily  . docusate sodium  200 mg Oral Daily  . mouth rinse  15 mL Mouth Rinse 10 times per day  . [START ON 03/13/2020] pantoprazole  40 mg Oral Daily  . sodium chloride flush  10-40 mL Intracatheter Q12H  . sodium chloride flush  3 mL Intravenous Q12H   Continuous Infusions: . sodium chloride 20 mL/hr at 03/12/20 0700  . sodium chloride    . sodium chloride    . albumin human    . amiodarone 30 mg/hr (03/12/20 0700)  . cefUROXime (ZINACEF)  IV Stopped (03/12/20 0002)  . dexmedetomidine (PRECEDEX) IV infusion Stopped (03/12/20 0547)  . epinephrine 1 mcg/min (03/12/20 0700)  . famotidine (PEPCID) IV Stopped (03/12/20 0603)  . insulin 2.4 mL/hr at 03/12/20 0700  . lactated ringers    . lactated ringers    . lactated ringers 20 mL/hr at 03/12/20 0700  . milrinone 0.25 mcg/kg/min (03/12/20 0700)  . nitroGLYCERIN    . phenylephrine (NEO-SYNEPHRINE) Adult infusion 25 mcg/min (03/12/20 0700)   PRN Meds:.sodium chloride, albumin human, dextrose, lactated ringers, metoprolol tartrate, midazolam, morphine injection, ondansetron (ZOFRAN) IV, oxyCODONE, sodium chloride flush, sodium chloride flush, traMADol  General appearance: alert, cooperative and no distress Heart: regular rate and rhythm, S1,  S2 normal, no murmur, click, rub or gallop Lungs: clear to auscultation bilaterally and diminished in the lower lobes Abdomen: soft, non-tender; bowel sounds normal; no masses,  no organomegaly Extremities: 1-2+ edema in hands, good distal pulses, no lower ext edema Wound: clean, dry, covered with a sterile dressing  Lab Results: CBC: Recent Labs    03/11/20 1932 03/11/20 1938 03/12/20 0215  WBC 14.5*  --  11.4*  HGB 10.1* 9.9* 9.4*  HCT 28.8* 29.0* 26.9*  PLT 129*  --  113*   BMET:  Recent Labs    03/09/20 1142 03/11/20 0913 03/11/20 1710 03/11/20 1938 03/12/20 0215  NA 137   < > 144 144 142  K 4.3   < > 3.6 3.3* 3.8  CL 107   < >  100  --  105  CO2 21*  --   --   --  23  GLUCOSE 92   < > 119*  --  123*  BUN 13   < > 13  --  15  CREATININE 0.88   < > 0.80  --  1.33*  CALCIUM 9.0  --   --   --  7.5*   < > = values in this interval not displayed.    CMET: Lab Results  Component Value Date   WBC 11.4 (H) 03/12/2020   HGB 9.4 (L) 03/12/2020   HCT 26.9 (L) 03/12/2020   PLT 113 (L) 03/12/2020   GLUCOSE 123 (H) 03/12/2020   CHOL 184 11/14/2019   TRIG 87 11/14/2019   HDL 70 11/14/2019   LDLCALC 98 11/14/2019   ALT 17 03/09/2020   AST 22 03/09/2020   NA 142 03/12/2020   K 3.8 03/12/2020   CL 105 03/12/2020   CREATININE 1.33 (H) 03/12/2020   BUN 15 03/12/2020   CO2 23 03/12/2020   TSH 4.060 03/12/2018   PSA 2.4 07/29/2014   INR 1.0 03/09/2020   HGBA1C 5.4 03/09/2020      PT/INR:  Recent Labs    03/09/20 1142  LABPROT 12.8  INR 1.0   Radiology: [facility] Chest Port 1 View  Result Date: 03/12/2020 CLINICAL DATA:  Intubation.  Heart surgery. EXAM: PORTABLE CHEST 1 VIEW COMPARISON:  03/11/2020. FINDINGS: Endotracheal tube noted good anatomic position 3 cm above the carina. NG tube, Swan-Ganz catheter, mediastinal drainage catheters, bilateral chest tubes in stable position. Prior cardiac valve replacement. Mediastinum stable. Heart size stable. Peripheral density noted the midportion of the right lung. This could represent area of atelectasis, infiltrate, or infarct. Low lung volumes persistent bilateral atelectatic changes. Tiny bilateral pleural effusions cannot be excluded. IMPRESSION: 1. Endotracheal tube in good anatomic position 3 cm above the carina. Remaining lines and tubes including bilateral chest tubes in stable position. No pneumothorax. 2. Prior cardiac valve replacement. Heart size stable. 3. Peripheral density noted in the right mid lung. This could represent an area of atelectasis, infiltrate, or infarct. Low lung volumes with persistent bilateral atelectatic changes. Tiny bilateral pleural effusions  cannot be excluded. Electronically Signed   By: Marcello Moores  Register   On: 03/12/2020 06:56   DG Chest Port 1 View  Result Date: 03/11/2020 CLINICAL DATA:  Atelectasis. EXAM: PORTABLE CHEST 1 VIEW COMPARISON:  March 09, 2020 FINDINGS: There is a left-sided Swan-Ganz catheter in place with tip projecting over the main right pulmonary artery. The endotracheal tube appears to terminate above the carina but is directed towards the right mainstem bronchus. Retracting the tube by approximately 1 cm would be useful.  There is an enteric tube extends below the left hemidiaphragm. Mediastinal drains and chest tubes are noted. The upper mediastinum is widened which is felt to be secondary to recent intervention. There is postoperative atelectasis at the lung bases with a trace left-sided pleural effusion. There is no large pneumothorax. IMPRESSION: 1. Endotracheal tube appears to terminate above the carina but is directed towards the right mainstem bronchus. Consider retracting the tube by approximately 1 cm. 2. Remaining lines and tubes as above. 3. Postoperative atelectasis at the lung bases with a trace left-sided pleural effusion. Electronically Signed   By: Constance Holster M.D.   On: 03/11/2020 19:52     Assessment/Plan: S/P Procedure(s) (LRB): TRANSESOPHAGEAL ECHOCARDIOGRAM (TEE) (N/A) REPAIR OF ACUTE ASCENDING THORACIC AORTIC DISSECTION USING A HEMASHIELD PLATINUM 26MM STRAIGHT GRAFT AND A HEASHIELD PLATINUM 28X10MM SINGLE ARM GRAFT (N/A) MITRAL VALVE REPAIR (MVR) USING MEMO 4D SIZE 32 RING (N/A)  1. CV-paced at 80bpm. MAPs 68-83, remains on Amio, Milrinone, Epi, and Neo. 2. Pulm- currently intubated, weaning for extubation this morning. 40% FiO2, CXR this morning shows: No pneumo, peripherial density noted in the right mid lung, likely atelectasis vs. Infiltrate. Tiny bilateral pleural effusions. Keep chest tubes.  3. Renal-creatinine 1.33, holding diuretics for now.  4. H and H 9.4/26.9, expected  acute blood loss anemia  5. Blood glucose well controlled. Recent CBGs 123, 132, 136  Plan: See progression orders. Weaning to extubate, patient responses to voice appropriately.      Elgie Collard 03/12/2020 8:27 AM    I have seen and examined the patient and agree with the assessment and plan as outlined.  Overall looks remarkably good under the circumstances.  Maintaining NSR - AAI paced with stable hemodynamics on low dose milrinone, and low dose Epi and Neo for BP support.  Neuro intact.  Just extubated.  Breathing okay w/ O2 sats 95% on venti mask, CXR looks okay.  Having expected soreness in chest, some difficulty taking deep breaths.   Mobilize.  Pulmonary toilet.  Lasix to stimulate diuresis.  Continue low dose milrinone for now.  Wean Neo as tolerated.     Rexene Alberts, MD 03/12/2020 9:31 AM

## 2020-03-12 NOTE — Plan of Care (Signed)
  Problem: Clinical Measurements: Goal: Ability to maintain clinical measurements within normal limits will improve Outcome: Progressing Goal: Respiratory complications will improve Outcome: Progressing Goal: Cardiovascular complication will be avoided Outcome: Progressing   Problem: Coping: Goal: Level of anxiety will decrease Outcome: Progressing   Problem: Pain Managment: Goal: General experience of comfort will improve Outcome: Progressing   Problem: Cardiac: Goal: Will achieve and/or maintain hemodynamic stability Outcome: Progressing   Problem: Clinical Measurements: Goal: Postoperative complications will be avoided or minimized Outcome: Progressing   Problem: Respiratory: Goal: Respiratory status will improve Outcome: Progressing

## 2020-03-12 NOTE — Procedures (Signed)
Extubation Procedure Note  Patient Details:   Name: Patrick Bell DOB: [birth year] MRN: [id number]   Airway Documentation:    Vent end date: 03/12/20 Vent end time: 0920   Evaluation  O2 sats: transiently fell during during procedure Complications: Complications of desat to 69% s/p extubation Patient -see above note- tolerate procedure well. Bilateral Breath Sounds: Diminished   Yes pt able to speak.  No stridor noted.  PT did have brief desat episode to 69% w/ extubation, pt placed on NRB, sat improved to 93-95%.  Dr Roxy Manns at bedside to eval pt.    Patrick Bell, Patrick Bell 03/12/2020, 9:35 AM

## 2020-03-12 NOTE — Progress Notes (Signed)
Recruitment done at this time, pt tolerated procedure well.

## 2020-03-12 NOTE — Progress Notes (Signed)
      [address]       [place] [address]             [phone]      POD # 1 Aortic dissection/ mitral repair  Up in chair  BP (!) 93/54   Pulse 89   Temp (!) 97.52 F (36.4 C)   Resp 20   Ht 5\' 10"  (1.778 m)   Wt 101.6 kg   SpO2 96%   BMI 32.14 kg/m  28/15 CI = 3.3 8L HFNC 92% sat Amiodarone @ 30, Epi @ 2, milrinone 0.25, neo @ 10   Intake/Output Summary (Last 24 hours) at 03/12/2020 1704 Last data filed at 03/12/2020 1700 Gross per 24 hour  Intake 6008.6 ml  Output 3720 ml  Net 2288.6 ml   Still markedly positive, hypotensive after lasix earlier- will give Albumin prior to Lasix this PM K= 3.7, creatinine 1.6, Hct= 26  Texanna Hilburn C. Roxan Hockey, MD [facility] [phone]

## 2020-03-12 NOTE — Addendum Note (Signed)
Addendum  created 03/12/20 1009 by Josephine Igo, CRNA   Order list changed, Pharmacy for encounter modified

## 2020-03-12 NOTE — Hospital Course (Addendum)
Patient is an 79 year old male with history of hypertension, obstructive sleep apnea, hyperlipidemia, GE reflux disease, and remote history of heavy tobacco use who has been referred for surgical consultation to discuss treatment options for management of recently discovered mitral valve prolapse with severe mitral regurgitation.   Patient has remained physically active and reasonably healthy all of his adult life.  He has been treated for essential hypertension and obstructive sleep apnea.  Several years ago he tried using CPAP but has stopped since then.  He reports a 6 to 49-month history of change in exercise tolerance with the development of exertional shortness of breath that typically occurs only with more strenuous physical exertion.  He has blamed this on a tendency that he has had to be less physically active.  He was recently noted to have a new prominent systolic murmur on physical exam by his primary care physician.  Transthoracic echocardiogram was performed demonstrating normal left ventricular function with mitral valve prolapse and severe mitral regurgitation.  The patient was referred for cardiology consultation and recently evaluated by Dr. Claiborne Billings.  The patient subsequently underwent TEE which confirmed the presence of mitral valve prolapse with flail segment involving a portion of the posterior leaflet and severe mitral regurgitation.  Cardiothoracic surgical consultation was requested.   Patient is married and lives locally in [place] with his wife.  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  He has remained reasonably active physically throughout his adult life and in retirement.  He does not exercise on a regular basis but he regularly performs fairly strenuous activities when he is working on his property or around the house.  He states that he has noticed a tendency to get short of breath with more strenuous physical exertion.  This is  developed over the past 6 to 12 months.  He denies resting shortness of breath, PND, or orthopnea.  He does note that breathing is worse when he lays flat in bed and he has also developed a persistent dry nonproductive cough.  He has had some atypical pains across the left chest which are not necessarily related to exertion.  These pains are described as a dull mild aching sensation that seem to come and go sporadically.  He has not had any palpitations or syncope.  He has had some dizzy spells that has been attributed to changes in his blood pressure medications.   Patient returns to the office today for follow-up prior to elective mitral valve repair for mitral valve prolapse with severe primary mitral regurgitation.  He was originally seen in consultation on January 09, 2020.  Since then he underwent diagnostic cardiac catheterization by Dr. Claiborne Billings on February 21, 2020.  He reports no new problems or complaints over the past 6 weeks although he does note that he has developed what he believes is an enlarging moderate-sized pulsatile lump in his right groin since his heart catheterization.  He states that he only gets short of breath with strenuous exertion and this has not progressed.  He has not had any significant chest pain or palpitations.  He denies any fevers, chills, or productive cough.  He has not been exposed to any persons with known or suspected COVID-19 infection.  His wife did have a recent upper respiratory tract infection but she was tested negative for COVID-19.  Both have been vaccinated.    Hospital Course:   On 12/08 Patrick Bell underwent a mitral valve repair and  repair of acute type A Aortic Dissection. The patient was transferred to the surgical ICU in stable condition. POD 1 he was extubated around 10am. He was AAI pacing with stable hemodynamics on low-dose milrinone, low dose Epi and Neo for BP support. Lasix was initiated to stimulate diuresis. POD 2, he continued to progress. We  continued to wean Neo and then Epi. We started a lasix drip and placed a PICC line in anticipation that patient may need IV infusions for several more days. His anterior pleural chest tube was removed. On 12/12 he was stable to transfer up to the telemetry floor for continued care. His EPW were moved on 12/13 and his coumadin was reduced since he did have a substantial INR bump. We continued to wean his oxygen as tolerated. He did have an episode of atrial fibrillation and Amio IV bolus was given. We added Cozaar for better BP control. We continued to wean his oxygen as tolerated. He remained in atrial fibrillation which was rate-controlled and we continued coumadin. He continued to improve but his disposition remained a challenge. Under no circumstances would he want to go to a nursing home. He is being worked up for [employer]. He is too weak to consider home with home health.  On 03/21/2019 when the patient continued to have significant issues with both weakness and blood pressure. Diuretics have been discontinued. Cardiology consultation was obtained to assist with management of atrial fibrillation and in work-up it was felt a an echocardiogram should be repeated which showed a pericardial effusion. 03/22/2020 he was taken back to the operating room for a pericardial window by Dr. Servando Snare. POD 1 from his pericardial window, he remained hemodynamically stable and in atrial fibrillation. He was rate-controlled on Amio. INR was supratherapeutic therefore we held his coumadin for a dose. We discontinued his foley catheter POD 2 and continued his pericardial drain. His pericardial drain was removed on 12/21 and on 12/23 he was stable to transfer to the telemetry unit for continued care.

## 2020-03-12 NOTE — Addendum Note (Signed)
Addendum  created 03/12/20 0804 by Josephine Igo, CRNA   Order list changed, Pharmacy for encounter modified

## 2020-03-13 ENCOUNTER — Inpatient Hospital Stay (HOSPITAL_COMMUNITY): Payer: Medicare Other

## 2020-03-13 ENCOUNTER — Inpatient Hospital Stay: Payer: Self-pay

## 2020-03-13 LAB — COMPREHENSIVE METABOLIC PANEL
ALT: 37 U/L (ref 0–44)
AST: 100 U/L — ABNORMAL HIGH (ref 15–41)
Albumin: 2.4 g/dL — ABNORMAL LOW (ref 3.5–5.0)
Alkaline Phosphatase: 34 U/L — ABNORMAL LOW (ref 38–126)
Anion gap: 9 (ref 5–15)
BUN: 23 mg/dL (ref 8–23)
CO2: 27 mmol/L (ref 22–32)
Calcium: 6.9 mg/dL — ABNORMAL LOW (ref 8.9–10.3)
Chloride: 103 mmol/L (ref 98–111)
Creatinine, Ser: 1.67 mg/dL — ABNORMAL HIGH (ref 0.61–1.24)
GFR, Estimated: 41 mL/min — ABNORMAL LOW (ref >60)
Glucose, Bld: 142 mg/dL — ABNORMAL HIGH (ref 70–99)
Potassium: 4 mmol/L (ref 3.5–5.1)
Sodium: 139 mmol/L (ref 135–145)
Total Bilirubin: 0.6 mg/dL (ref 0.3–1.2)
Total Protein: 4.4 g/dL — ABNORMAL LOW (ref 6.5–8.1)

## 2020-03-13 LAB — BASIC METABOLIC PANEL
Anion gap: 8 (ref 5–15)
BUN: 24 mg/dL — ABNORMAL HIGH (ref 8–23)
CO2: 28 mmol/L (ref 22–32)
Calcium: 7.1 mg/dL — ABNORMAL LOW (ref 8.9–10.3)
Chloride: 103 mmol/L (ref 98–111)
Creatinine, Ser: 1.56 mg/dL — ABNORMAL HIGH (ref 0.61–1.24)
GFR, Estimated: 45 mL/min — ABNORMAL LOW (ref >60)
Glucose, Bld: 136 mg/dL — ABNORMAL HIGH (ref 70–99)
Potassium: 3.9 mmol/L (ref 3.5–5.1)
Sodium: 139 mmol/L (ref 135–145)

## 2020-03-13 LAB — CBC
HCT: 26.1 % — ABNORMAL LOW (ref 39.0–52.0)
Hemoglobin: 8.9 g/dL — ABNORMAL LOW (ref 13.0–17.0)
MCH: 30.4 pg (ref 26.0–34.0)
MCHC: 34.1 g/dL (ref 30.0–36.0)
MCV: 89.1 fL (ref 80.0–100.0)
Platelets: UNDETERMINED 10*3/uL (ref 150–400)
RBC: 2.93 MIL/uL — ABNORMAL LOW (ref 4.22–5.81)
RDW: 16 % — ABNORMAL HIGH (ref 11.5–15.5)
WBC: 20.5 10*3/uL — ABNORMAL HIGH (ref 4.0–10.5)
nRBC: 0 % (ref 0.0–0.2)

## 2020-03-13 LAB — GLUCOSE, CAPILLARY
Glucose-Capillary: 113 mg/dL — ABNORMAL HIGH (ref 70–99)
Glucose-Capillary: 110 mg/dL — ABNORMAL HIGH (ref 70–99)
Glucose-Capillary: 119 mg/dL — ABNORMAL HIGH (ref 70–99)
Glucose-Capillary: 103 mg/dL — ABNORMAL HIGH (ref 70–99)
Glucose-Capillary: 105 mg/dL — ABNORMAL HIGH (ref 70–99)

## 2020-03-13 LAB — PROTIME-INR
INR: 1.1 (ref 0.8–1.2)
Prothrombin Time: 14.2 seconds (ref 11.4–15.2)

## 2020-03-13 LAB — COOXEMETRY PANEL
Carboxyhemoglobin: 0.8 % (ref 0.5–1.5)
Methemoglobin: 1.1 % (ref 0.0–1.5)
O2 Saturation: 66.8 %
Total hemoglobin: 9 g/dL — ABNORMAL LOW (ref 12.0–16.0)

## 2020-03-13 LAB — MAGNESIUM: Magnesium: 2.2 mg/dL (ref 1.7–2.4)

## 2020-03-13 MED ORDER — FENTANYL 50 MCG/HR TD PT72
1.0000 | MEDICATED_PATCH | TRANSDERMAL | Status: DC
  Administered 2020-03-13: 1 via TRANSDERMAL
  Filled 2020-03-13: qty 1

## 2020-03-13 MED ORDER — ORAL CARE MOUTH RINSE
15.0000 mL | Freq: Two times a day (BID) | OROMUCOSAL | Status: DC
  Administered 2020-03-13 – 2020-03-29 (×24): 15 mL via OROMUCOSAL

## 2020-03-13 MED ORDER — SODIUM CHLORIDE 0.9% FLUSH: 10.0000 mL | Freq: Two times a day (BID) | INTRAVENOUS | Status: DC

## 2020-03-13 MED ORDER — ~~LOC~~ CARDIAC SURGERY, PATIENT & FAMILY EDUCATION: Freq: Once | Status: AC

## 2020-03-13 MED ORDER — FUROSEMIDE 10 MG/ML IJ SOLN
10.0000 mg/h | INTRAVENOUS | Status: DC
  Administered 2020-03-13 – 2020-03-14 (×3): 10 mg/h via INTRAVENOUS
  Filled 2020-03-13 (×3): qty 20

## 2020-03-13 MED ORDER — COUMADIN BOOK
Freq: Once | Status: AC
  Filled 2020-03-13: qty 1

## 2020-03-13 MED ORDER — SODIUM CHLORIDE 0.9% FLUSH: 10.0000 mL | INTRAVENOUS | Status: DC | PRN

## 2020-03-13 MED ORDER — SODIUM CHLORIDE 0.9 % IV SOLN
INTRAVENOUS | Status: DC | PRN
  Administered 2020-03-13: 500 mL via INTRAVENOUS

## 2020-03-13 NOTE — Progress Notes (Addendum)
TCTS DAILY ICU PROGRESS NOTE                   [address]            [place] [address]          [phone]   2 Days Post-Op Procedure(s) (LRB): TRANSESOPHAGEAL ECHOCARDIOGRAM (TEE) (N/A) REPAIR OF ACUTE ASCENDING THORACIC AORTIC DISSECTION USING A HEMASHIELD PLATINUM 26MM STRAIGHT GRAFT AND A HEASHIELD PLATINUM 28X10MM SINGLE ARM GRAFT (N/A) MITRAL VALVE REPAIR (MVR) USING MEMO 4D SIZE 32 RING (N/A)  Total Length of Stay:  LOS: 2 days   Subjective: Feels okay this morning. No complaints.   Objective: Vital signs in last 24 hours: Temp:  [97.34 F (36.3 C)-97.9 F (36.6 C)] 97.7 F (36.5 C) (12/10 0355) Pulse Rate:  [80-102] 89 (12/10 0700) Cardiac Rhythm: Atrial paced (12/09 2000) Resp:  [13-26] 16 (12/10 0700) BP: (89-141)/(51-81) 141/68 (12/10 0700) SpO2:  [88 %-100 %] 94 % (12/10 0700) Arterial Line BP: (88-135)/(44-62) 135/62 (12/10 0700) FiO2 (%):  [40 %] 40 % (12/09 0805) Weight:  [99.4 kg] 99.4 kg (12/10 0600)  Filed Weights   03/11/20 0653 03/12/20 0553 03/13/20 0600  Weight: 83.9 kg 101.6 kg 99.4 kg    Weight change: -2.2 kg   Hemodynamic parameters for last 24 hours: PAP: (23-36)/(12-22) 28/12 CO:  [5.2 L/min-6.6 L/min] 6.6 L/min CI:  [2.6 L/min/m2-3.3 L/min/m2] 3.3 L/min/m2  Intake/Output from previous day: 12/09 0701 - 12/10 0700 In: 2936.7 [P.O.:240; I.V.:2019.3; IV Piggyback:677.5] Out: 3190 [Urine:2050; Chest Tube:1140]  Intake/Output this shift: No intake/output data recorded.  Current Meds: Scheduled Meds: . sodium chloride   Intravenous Once  . acetaminophen  1,000 mg Oral Q6H  . aspirin EC  81 mg Oral Daily  . bisacodyl  10 mg Oral Daily   Or  . bisacodyl  10 mg Rectal Daily  . Chlorhexidine Gluconate Cloth  6 each Topical Daily  . [facility], Patient & Family Education   Does not apply Once  . docusate sodium  200 mg Oral Daily  . insulin aspart  0-24 Units Subcutaneous Q4H  . pantoprazole  40  mg Oral Daily  . sodium chloride flush  10-40 mL Intracatheter Q12H  . sodium chloride flush  3 mL Intravenous Q12H  . warfarin  2.5 mg Oral q1600  . Warfarin - Physician Dosing Inpatient   Does not apply q1600   Continuous Infusions: . sodium chloride    . amiodarone 30 mg/hr (03/13/20 0700)  . cefUROXime (ZINACEF)  IV Stopped (03/12/20 2208)  . epinephrine 1 mcg/min (03/13/20 0700)  . furosemide (LASIX) 200 mg in dextrose 5% 100 mL (2mg /mL) infusion    . insulin Stopped (03/12/20 1028)  . lactated ringers    . lactated ringers 20 mL/hr at 03/13/20 0700  . milrinone 0.25 mcg/kg/min (03/13/20 0700)  . phenylephrine (NEO-SYNEPHRINE) Adult infusion 50 mcg/min (03/13/20 0700)   PRN Meds:.dextrose, metoprolol tartrate, morphine injection, ondansetron (ZOFRAN) IV, oxyCODONE, sodium chloride flush, sodium chloride flush, traMADol  General appearance: alert, cooperative and no distress Heart: regular rate and rhythm, S1, S2 normal, no murmur, click, rub or gallop Lungs: clear to auscultation bilaterally Abdomen: soft, non-tender; bowel sounds normal; no masses,  no organomegaly Extremities: 2+ edema in upper ext Wound: clean and dry covered with a sterile dressing  Lab Results: CBC: Recent Labs    03/12/20 1646 03/12/20 1658 03/13/20 0505  WBC 19.1*  --  20.5*  HGB 9.5* 8.8*  8.9*  HCT 27.7* 26.0* 26.1*  PLT 122*  --  PLATELET CLUMPS NOTED ON SMEAR, UNABLE TO ESTIMATE   BMET:  Recent Labs    03/12/20 1646 03/12/20 1658 03/12/20 2256 03/13/20 0505  NA 141 142  --  139  K 3.7 3.7 4.0 4.0  CL 103 101  --  103  CO2 22  --   --  27  GLUCOSE 189* 188*  --  142*  BUN 19 21  --  23  CREATININE 1.76* 1.60*  --  1.67*  CALCIUM 6.9*  --   --  6.9*    CMET: Lab Results  Component Value Date   WBC 20.5 (H) 03/13/2020   HGB 8.9 (L) 03/13/2020   HCT 26.1 (L) 03/13/2020   PLT PLATELET CLUMPS NOTED ON SMEAR, UNABLE TO ESTIMATE 03/13/2020   GLUCOSE 142 (H) 03/13/2020   CHOL 184  11/14/2019   TRIG 87 11/14/2019   HDL 70 11/14/2019   LDLCALC 98 11/14/2019   ALT 37 03/13/2020   AST 100 (H) 03/13/2020   NA 139 03/13/2020   K 4.0 03/13/2020   CL 103 03/13/2020   CREATININE 1.67 (H) 03/13/2020   BUN 23 03/13/2020   CO2 27 03/13/2020   TSH 4.060 03/12/2018   PSA 2.4 07/29/2014   INR 1.1 03/13/2020   HGBA1C 5.4 03/09/2020      PT/INR:  Recent Labs    03/13/20 0505  LABPROT 14.2  INR 1.1   Radiology: [place] Day Chest Port 1 View  Result Date: 03/13/2020 CLINICAL DATA:  Chest tube.  Open-heart surgery. EXAM: PORTABLE CHEST 1 VIEW COMPARISON:  03/12/2020. FINDINGS: Interim extubation and removal of NG tube. Interim removal Swan-Ganz catheter. Left IJ sheath in stable position. Mediastinal drainage catheter and bilateral chest tubes in stable position. Prior cardiac valve replacement. Heart size stable. Low lung volumes with bibasilar atelectasis. Tiny bilateral pleural effusions again cannot be excluded. No pneumothorax. IMPRESSION: Interim extubation and removal of NG tube. Interim removal of Swan-Ganz catheter. Mediastinal drainage catheter and bilateral chest tubes in stable position. No pneumothorax. Electronically Signed   By: Marcello Moores  Register   On: 03/13/2020 07:43   [place] EKG SITE RITE  Result Date: 03/13/2020 If Site Rite image not attached, placement could not be confirmed due to current cardiac rhythm.    Assessment/Plan: S/P Procedure(s) (LRB): TRANSESOPHAGEAL ECHOCARDIOGRAM (TEE) (N/A) REPAIR OF ACUTE ASCENDING THORACIC AORTIC DISSECTION USING A HEMASHIELD PLATINUM 26MM STRAIGHT GRAFT AND A HEASHIELD PLATINUM 28X10MM SINGLE ARM GRAFT (N/A) MITRAL VALVE REPAIR (MVR) USING MEMO 4D SIZE 32 RING (N/A)  1. CV- remains on low-dose epi, milrinone 0.25, and Neo. Will continue to wean Neo as tolerated. BP has been well controlled.    2. Pulm-Remains on oxygen, saturation 92%, encouraged incentive spirometer hourly. CXR shows low lung volumes and no pneumothorax.  Keep chest tubes.  3. Renal- stable 1.67 today, electrolytes okay, continue lasix drip for fluid overload 4. H and H 8.9/26.1, expected acute blood loss anemia 5. Endo-blood glucose well controlled. Continue SSI 6. On low-dose coumadin. INR 1.1 7. GI- continue liquid diet, zofran for nausea.   Plan: Weaning Neo as able. Continue foley catheter and chest tubes. Pain is well controlled. He continues to make slow and steady progress. OOB to chair. Ambulate in the halls later today.    Elgie Collard 03/13/2020 7:53 AM    I have seen and examined the patient and agree with the assessment and plan as outlined.  Overall looks good now  POD2.  Maintaining NSR - AAI paced rhythm w/ stable hemodynamics w/ mixed venous co-ox 67% on low dose milrinone w/ low dose Neo and Epi drips for BP support.  Breathing comfortably w/ O2 sats 94-98% on HFNC and CXR looks good.  UOP adequate and creatinine stable 1.67 today w/ post op elevated serum creatinine c/w acute exacerbation of baseline CKD, likely due to prerenal azotemia +/- acute kidney injury caused by ATN.  Acute on chronic diastolic CHF with expected post-op volume excess, weight 15 kg > preop baseline.  Chest tubes with decreasing but significant volume thin serosanguinous output, no air leak.     Leave chest tubes in at least 1 more day  Continue low dose milrinone for now  Wean Neo then Epi drips slowly as tolerated  Start lasix drip  Insert PICC line in anticipation that patient may need IV infusions for several more days  Watch renal function, anemia and platelet count  Mobilize  Coumadin and low dose aspirin   Rexene Alberts, MD 03/13/2020 9:12 AM

## 2020-03-13 NOTE — Progress Notes (Signed)
Received orders to pull right anterior pleural tube. Immediately upon removal a large gust of air came out of the incision. Pressure immediately held. Occlusive dressing placed. O2 sats stable 94-96. Lung sounds unchanged. Nicholes Rough, PA notified who stated she would order a pcxr. Will monitor closely.  Joellen Jersey, RN

## 2020-03-13 NOTE — Progress Notes (Signed)
Peripherally Inserted Central Catheter Placement  The IV Nurse has discussed with the patient and/or persons authorized to consent for the patient, the purpose of this procedure and the potential benefits and risks involved with this procedure.  The benefits include less needle sticks, lab draws from the catheter, and the patient may be discharged home with the catheter. Risks include, but not limited to, infection, bleeding, blood clot (thrombus formation), and puncture of an artery; nerve damage and irregular heartbeat and possibility to perform a PICC exchange if needed/ordered by physician.  Alternatives to this procedure were also discussed.  B[employer] Power PICC patient education guide, fact sheet on infection prevention and patient information card has been provided to patient /or left at bedside.    PICC Placement Documentation  PICC Double Lumen 97/74/14 PICC Left Cephalic 45 cm 0 cm (Active)  Indication for Insertion or Continuance of Line Vasoactive infusions 03/13/20 1148  Exposed Catheter (cm) 0 cm 03/13/20 1148  Site Assessment Clean;Dry;Intact 03/13/20 1148  Lumen #1 Status Flushed;Saline locked;Blood return noted 03/13/20 1148  Lumen #2 Status Flushed;Saline locked;Blood return noted 03/13/20 1148  Dressing Type Transparent 03/13/20 1148  Dressing Status Clean;Dry;Intact 03/13/20 1148  Antimicrobial disc in place? Yes 03/13/20 1148  Dressing Intervention New dressing 03/13/20 1148  Dressing Change Due 03/20/20 03/13/20 1148       Gordan Payment 03/13/2020, 11:49 AM

## 2020-03-13 NOTE — Progress Notes (Signed)
PICC order received. Spoke to primary RN re: PICC placement,  will update IV/PICC team when patient is back to bed and ready for PICC placement.

## 2020-03-13 NOTE — Progress Notes (Signed)
At 1235 patient's HR increased to 118-129, with intermittent absence of P waves. Appeared to be in/out of Afib. Epicardial pacer turned off, epi drip turned off. BP remained stable. Dr Roxy Manns notified - orders to give 150mg  amio bolus; also if BP drops to restart neo instead of epi. Patient and BP tolerated bolus. Patient's rate now 95-100, still slightly irregular. Will monitor closely.  Joellen Jersey, RN

## 2020-03-13 NOTE — Plan of Care (Signed)
  Problem: Clinical Measurements: Goal: Ability to maintain clinical measurements within normal limits will improve Outcome: Progressing Goal: Respiratory complications will improve Outcome: Progressing Goal: Cardiovascular complication will be avoided Outcome: Progressing   Problem: Activity: Goal: Risk for activity intolerance will decrease Outcome: Progressing   Problem: Pain Managment: Goal: General experience of comfort will improve Outcome: Progressing

## 2020-03-14 ENCOUNTER — Inpatient Hospital Stay (HOSPITAL_COMMUNITY): Payer: Medicare Other

## 2020-03-14 LAB — CBC
HCT: 27.8 % — ABNORMAL LOW (ref 39.0–52.0)
Hemoglobin: 9.1 g/dL — ABNORMAL LOW (ref 13.0–17.0)
MCH: 29.7 pg (ref 26.0–34.0)
MCHC: 32.7 g/dL (ref 30.0–36.0)
MCV: 90.8 fL (ref 80.0–100.0)
Platelets: 119 10*3/uL — ABNORMAL LOW (ref 150–400)
RBC: 3.06 MIL/uL — ABNORMAL LOW (ref 4.22–5.81)
RDW: 16.2 % — ABNORMAL HIGH (ref 11.5–15.5)
WBC: 20.4 10*3/uL — ABNORMAL HIGH (ref 4.0–10.5)
nRBC: 0 % (ref 0.0–0.2)

## 2020-03-14 LAB — COMPREHENSIVE METABOLIC PANEL
ALT: 33 U/L (ref 0–44)
ALT: 30 U/L (ref 0–44)
AST: 53 U/L — ABNORMAL HIGH (ref 15–41)
AST: 39 U/L (ref 15–41)
Albumin: 2.4 g/dL — ABNORMAL LOW (ref 3.5–5.0)
Albumin: 2.3 g/dL — ABNORMAL LOW (ref 3.5–5.0)
Alkaline Phosphatase: 36 U/L — ABNORMAL LOW (ref 38–126)
Alkaline Phosphatase: 41 U/L (ref 38–126)
Anion gap: 8 (ref 5–15)
Anion gap: 9 (ref 5–15)
BUN: 31 mg/dL — ABNORMAL HIGH (ref 8–23)
BUN: 36 mg/dL — ABNORMAL HIGH (ref 8–23)
CO2: 29 mmol/L (ref 22–32)
CO2: 29 mmol/L (ref 22–32)
Calcium: 7.2 mg/dL — ABNORMAL LOW (ref 8.9–10.3)
Calcium: 7 mg/dL — ABNORMAL LOW (ref 8.9–10.3)
Chloride: 103 mmol/L (ref 98–111)
Chloride: 102 mmol/L (ref 98–111)
Creatinine, Ser: 1.83 mg/dL — ABNORMAL HIGH (ref 0.61–1.24)
Creatinine, Ser: 1.84 mg/dL — ABNORMAL HIGH (ref 0.61–1.24)
GFR, Estimated: 37 mL/min — ABNORMAL LOW (ref >60)
GFR, Estimated: 37 mL/min — ABNORMAL LOW (ref >60)
Glucose, Bld: 114 mg/dL — ABNORMAL HIGH (ref 70–99)
Glucose, Bld: 168 mg/dL — ABNORMAL HIGH (ref 70–99)
Potassium: 3.9 mmol/L (ref 3.5–5.1)
Potassium: 3.8 mmol/L (ref 3.5–5.1)
Sodium: 140 mmol/L (ref 135–145)
Sodium: 140 mmol/L (ref 135–145)
Total Bilirubin: 0.5 mg/dL (ref 0.3–1.2)
Total Bilirubin: 0.8 mg/dL (ref 0.3–1.2)
Total Protein: 4.8 g/dL — ABNORMAL LOW (ref 6.5–8.1)
Total Protein: 4.8 g/dL — ABNORMAL LOW (ref 6.5–8.1)

## 2020-03-14 LAB — PROTIME-INR
INR: 1.1 (ref 0.8–1.2)
Prothrombin Time: 14 seconds (ref 11.4–15.2)

## 2020-03-14 LAB — COOXEMETRY PANEL
Carboxyhemoglobin: 1 % (ref 0.5–1.5)
Methemoglobin: 0.8 % (ref 0.0–1.5)
O2 Saturation: 68.9 %
Total hemoglobin: 9.2 g/dL — ABNORMAL LOW (ref 12.0–16.0)

## 2020-03-14 LAB — GLUCOSE, CAPILLARY
Glucose-Capillary: 102 mg/dL — ABNORMAL HIGH (ref 70–99)
Glucose-Capillary: 104 mg/dL — ABNORMAL HIGH (ref 70–99)
Glucose-Capillary: 105 mg/dL — ABNORMAL HIGH (ref 70–99)
Glucose-Capillary: 112 mg/dL — ABNORMAL HIGH (ref 70–99)
Glucose-Capillary: 87 mg/dL (ref 70–99)

## 2020-03-14 LAB — MAGNESIUM: Magnesium: 2.2 mg/dL (ref 1.7–2.4)

## 2020-03-14 MED ORDER — COLCHICINE 0.3 MG HALF TABLET
0.3000 mg | ORAL_TABLET | Freq: Two times a day (BID) | ORAL | Status: DC
  Administered 2020-03-14 – 2020-03-17 (×8): 0.3 mg via ORAL
  Filled 2020-03-14 (×10): qty 1

## 2020-03-14 MED ORDER — DIAZEPAM 2 MG PO TABS
2.0000 mg | ORAL_TABLET | Freq: Three times a day (TID) | ORAL | Status: DC
  Administered 2020-03-14 – 2020-03-15 (×3): 2 mg via ORAL
  Filled 2020-03-14 (×3): qty 1

## 2020-03-14 NOTE — Progress Notes (Signed)
TCTS Evening Rounds  Stable Day Good UOP BP improved Remains in NSR BP 137/65 (BP Location: Right Arm)   Pulse 81   Temp 98.3 F (36.8 C) (Oral)   Resp 10   Ht 5\' 10"  (1.778 m)   Wt 97.6 kg   SpO2 95%   BMI 30.87 kg/m  Resting comfortably RRR CTA   Intake/Output Summary (Last 24 hours) at 03/14/2020 2105 Last data filed at 03/14/2020 2000 Gross per 24 hour  Intake 1938.33 ml  Output 2385 ml  Net -446.67 ml    A/P  Continue present management overnight. Jakaria Lavergne Z. Orvan Seen, [place]

## 2020-03-14 NOTE — Plan of Care (Signed)
  Problem: Clinical Measurements: Goal: Ability to maintain clinical measurements within normal limits will improve Outcome: Progressing Goal: Will remain free from infection Outcome: Progressing Goal: Diagnostic test results will improve Outcome: Progressing Goal: Respiratory complications will improve Outcome: Progressing Goal: Cardiovascular complication will be avoided Outcome: Progressing   Problem: Activity: Goal: Risk for activity intolerance will decrease Outcome: Progressing   Problem: Nutrition: Goal: Adequate nutrition will be maintained Outcome: Progressing   Problem: Coping: Goal: Level of anxiety will decrease Outcome: Progressing   Problem: Pain Managment: Goal: General experience of comfort will improve Outcome: Progressing   Problem: Education: Goal: Will demonstrate proper wound care and an understanding of methods to prevent future damage Outcome: Progressing Goal: Knowledge of disease or condition will improve Outcome: Progressing Goal: Knowledge of the prescribed therapeutic regimen will improve Outcome: Progressing Goal: Individualized Educational Video(s) Outcome: Progressing   Problem: Cardiac: Goal: Will achieve and/or maintain hemodynamic stability Outcome: Progressing   Problem: Clinical Measurements: Goal: Postoperative complications will be avoided or minimized Outcome: Progressing

## 2020-03-14 NOTE — Progress Notes (Signed)
3 Days Post-Op Procedure(s) (LRB): TRANSESOPHAGEAL ECHOCARDIOGRAM (TEE) (N/A) REPAIR OF ACUTE ASCENDING THORACIC AORTIC DISSECTION USING A HEMASHIELD PLATINUM 26MM STRAIGHT GRAFT AND A HEASHIELD PLATINUM 28X10MM SINGLE ARM GRAFT (N/A) MITRAL VALVE REPAIR (MVR) USING MEMO 4D SIZE 32 RING (N/A) Subjective: Incisional pain  Objective: Vital signs in last 24 hours: Temp:  [97.1 F (36.2 C)-98.2 F (36.8 C)] 98.2 F (36.8 C) (12/11 0700) Pulse Rate:  [53-116] 78 (12/11 0800) Cardiac Rhythm: Atrial fibrillation (12/11 0400) Resp:  [9-22] 18 (12/11 0800) BP: (84-134)/(49-77) 122/61 (12/11 0800) SpO2:  [90 %-100 %] 95 % (12/11 0800) Weight:  [97.6 kg] 97.6 kg (12/11 0600)  Hemodynamic parameters for last 24 hours:    Intake/Output from previous day: 12/10 0701 - 12/11 0700 In: 2089.2 [P.O.:800; I.V.:1189.1; IV Piggyback:100.1] Out: 3065 [Urine:2275; Chest Tube:790] Intake/Output this shift: No intake/output data recorded.  General appearance: alert and cooperative Neurologic: intact Heart: regular rate and rhythm, S1, S2 normal, no murmur, click, rub or gallop Lungs: clear to auscultation bilaterally Abdomen: soft, non-tender; bowel sounds normal; no masses,  no organomegaly Extremities: edema 2+ Wound: dressed, dry  Lab Results: Recent Labs    03/13/20 0505 03/14/20 0356  WBC 20.5* 20.4*  HGB 8.9* 9.1*  HCT 26.1* 27.8*  PLT PLATELET CLUMPS NOTED ON SMEAR, UNABLE TO ESTIMATE 119*   BMET:  Recent Labs    03/13/20 1451 03/14/20 0356  NA 139 140  K 3.9 3.9  CL 103 103  CO2 28 29  GLUCOSE 136* 114*  BUN 24* 31*  CREATININE 1.56* 1.83*  CALCIUM 7.1* 7.2*    PT/INR:  Recent Labs    03/14/20 0356  LABPROT 14.0  INR 1.1   ABG    Component Value Date/Time   PHART 7.373 03/12/2020 1034   HCO3 26.1 03/12/2020 1034   TCO2 23 03/12/2020 1658   ACIDBASEDEF 2.0 03/12/2020 0909   O2SAT 68.9 03/14/2020 0357   CBG (last 3)  Recent Labs    03/14/20 0016  03/14/20 0407 03/14/20 0648  GLUCAP 102* 105* 112*    Assessment/Plan: S/P Procedure(s) (LRB): TRANSESOPHAGEAL ECHOCARDIOGRAM (TEE) (N/A) REPAIR OF ACUTE ASCENDING THORACIC AORTIC DISSECTION USING A HEMASHIELD PLATINUM 26MM STRAIGHT GRAFT AND A HEASHIELD PLATINUM 28X10MM SINGLE ARM GRAFT (N/A) MITRAL VALVE REPAIR (MVR) USING MEMO 4D SIZE 32 RING (N/A) Mobilize Diuresis d/c tubes/lines   LOS: 3 days    Patrick Bell 03/14/2020

## 2020-03-15 ENCOUNTER — Inpatient Hospital Stay (HOSPITAL_COMMUNITY): Payer: Medicare Other

## 2020-03-15 LAB — BPAM RBC
Blood Product Expiration Date: 202201012359
Blood Product Expiration Date: 202201052359
Blood Product Expiration Date: 202201052359
Blood Product Expiration Date: 202201052359
Blood Product Expiration Date: 202201052359
Blood Product Expiration Date: 202201052359
Blood Product Expiration Date: 202201082359
Blood Product Expiration Date: 202201082359
Blood Product Expiration Date: 202201082359
Blood Product Expiration Date: 202201082359
Blood Product Expiration Date: 202201082359
Blood Product Expiration Date: 202201082359
Blood Product Expiration Date: 202201082359
Blood Product Expiration Date: 202201082359
ISSUE DATE / TIME: 202112081048
ISSUE DATE / TIME: 202112081048
ISSUE DATE / TIME: 202112081101
ISSUE DATE / TIME: 202112081101
ISSUE DATE / TIME: 202112081116
ISSUE DATE / TIME: 202112081116
ISSUE DATE / TIME: 202112081321
ISSUE DATE / TIME: 202112082057
ISSUE DATE / TIME: 202112082146
ISSUE DATE / TIME: 202112082146
Unit Type and Rh: 6200
Unit Type and Rh: 6200
Unit Type and Rh: 6200
Unit Type and Rh: 6200
Unit Type and Rh: 6200
Unit Type and Rh: 6200
Unit Type and Rh: 6200
Unit Type and Rh: 6200
Unit Type and Rh: 6200
Unit Type and Rh: 6200
Unit Type and Rh: 6200
Unit Type and Rh: 6200
Unit Type and Rh: 6200
Unit Type and Rh: 6200

## 2020-03-15 LAB — TYPE AND SCREEN
ABO/RH(D): A POS
Antibody Screen: NEGATIVE
Unit division: 0
Unit division: 0
Unit division: 0
Unit division: 0
Unit division: 0
Unit division: 0
Unit division: 0
Unit division: 0
Unit division: 0
Unit division: 0
Unit division: 0
Unit division: 0
Unit division: 0
Unit division: 0

## 2020-03-15 LAB — COMPREHENSIVE METABOLIC PANEL
ALT: 28 U/L (ref 0–44)
AST: 31 U/L (ref 15–41)
Albumin: 2.2 g/dL — ABNORMAL LOW (ref 3.5–5.0)
Alkaline Phosphatase: 44 U/L (ref 38–126)
Anion gap: 9 (ref 5–15)
BUN: 40 mg/dL — ABNORMAL HIGH (ref 8–23)
CO2: 31 mmol/L (ref 22–32)
Calcium: 7.3 mg/dL — ABNORMAL LOW (ref 8.9–10.3)
Chloride: 100 mmol/L (ref 98–111)
Creatinine, Ser: 1.98 mg/dL — ABNORMAL HIGH (ref 0.61–1.24)
GFR, Estimated: 34 mL/min — ABNORMAL LOW (ref >60)
Glucose, Bld: 119 mg/dL — ABNORMAL HIGH (ref 70–99)
Potassium: 3.7 mmol/L (ref 3.5–5.1)
Sodium: 140 mmol/L (ref 135–145)
Total Bilirubin: 0.8 mg/dL (ref 0.3–1.2)
Total Protein: 5 g/dL — ABNORMAL LOW (ref 6.5–8.1)

## 2020-03-15 LAB — CBC
HCT: 27.8 % — ABNORMAL LOW (ref 39.0–52.0)
Hemoglobin: 9.1 g/dL — ABNORMAL LOW (ref 13.0–17.0)
MCH: 30.1 pg (ref 26.0–34.0)
MCHC: 32.7 g/dL (ref 30.0–36.0)
MCV: 92.1 fL (ref 80.0–100.0)
Platelets: 116 10*3/uL — ABNORMAL LOW (ref 150–400)
RBC: 3.02 MIL/uL — ABNORMAL LOW (ref 4.22–5.81)
RDW: 15.7 % — ABNORMAL HIGH (ref 11.5–15.5)
WBC: 12.7 10*3/uL — ABNORMAL HIGH (ref 4.0–10.5)
nRBC: 0 % (ref 0.0–0.2)

## 2020-03-15 LAB — GLUCOSE, CAPILLARY
Glucose-Capillary: 100 mg/dL — ABNORMAL HIGH (ref 70–99)
Glucose-Capillary: 83 mg/dL (ref 70–99)
Glucose-Capillary: 90 mg/dL (ref 70–99)
Glucose-Capillary: 93 mg/dL (ref 70–99)
Glucose-Capillary: 108 mg/dL — ABNORMAL HIGH (ref 70–99)
Glucose-Capillary: 80 mg/dL (ref 70–99)
Glucose-Capillary: 99 mg/dL (ref 70–99)

## 2020-03-15 LAB — COOXEMETRY PANEL
Carboxyhemoglobin: 1.1 % (ref 0.5–1.5)
Methemoglobin: 0.9 % (ref 0.0–1.5)
O2 Saturation: 68.9 %
Total hemoglobin: 9.2 g/dL — ABNORMAL LOW (ref 12.0–16.0)

## 2020-03-15 LAB — PROTIME-INR
INR: 1.3 — ABNORMAL HIGH (ref 0.8–1.2)
Prothrombin Time: 15.7 seconds — ABNORMAL HIGH (ref 11.4–15.2)

## 2020-03-15 LAB — MAGNESIUM: Magnesium: 2.1 mg/dL (ref 1.7–2.4)

## 2020-03-15 MED ORDER — INSULIN ASPART 100 UNIT/ML ~~LOC~~ SOLN: 0.0000 [IU] | Freq: Three times a day (TID) | SUBCUTANEOUS | Status: DC

## 2020-03-15 MED ORDER — AMIODARONE HCL 200 MG PO TABS
400.0000 mg | ORAL_TABLET | Freq: Two times a day (BID) | ORAL | Status: DC
  Administered 2020-03-15 – 2020-03-21 (×13): 400 mg via ORAL
  Filled 2020-03-15 (×13): qty 2

## 2020-03-15 MED ORDER — FUROSEMIDE 10 MG/ML IJ SOLN
40.0000 mg | Freq: Two times a day (BID) | INTRAMUSCULAR | Status: DC
  Administered 2020-03-15 – 2020-03-19 (×8): 40 mg via INTRAVENOUS
  Filled 2020-03-15 (×8): qty 4

## 2020-03-15 MED ORDER — POTASSIUM CHLORIDE 10 MEQ/50ML IV SOLN
10.0000 meq | INTRAVENOUS | Status: AC
  Administered 2020-03-15 (×3): 10 meq via INTRAVENOUS
  Filled 2020-03-15 (×3): qty 50

## 2020-03-15 NOTE — Progress Notes (Signed)
4 Days Post-Op Procedure(s) (LRB): TRANSESOPHAGEAL ECHOCARDIOGRAM (TEE) (N/A) REPAIR OF ACUTE ASCENDING THORACIC AORTIC DISSECTION USING A HEMASHIELD PLATINUM 26MM STRAIGHT GRAFT AND A HEASHIELD PLATINUM 28X10MM SINGLE ARM GRAFT (N/A) MITRAL VALVE REPAIR (MVR) USING MEMO 4D SIZE 32 RING (N/A) Subjective: No complaints  Objective: Vital signs in last 24 hours: Temp:  [96.9 F (36.1 C)-98.3 F (36.8 C)] 98.2 F (36.8 C) (12/12 0756) Pulse Rate:  [73-85] 85 (12/12 0700) Cardiac Rhythm: Normal sinus rhythm;Heart block (12/12 0400) Resp:  [8-22] 16 (12/12 0700) BP: (103-145)/(53-83) 145/71 (12/12 0700) SpO2:  [85 %-100 %] 97 % (12/12 0700) Weight:  [95.2 kg] 95.2 kg (12/12 0600)  Hemodynamic parameters for last 24 hours:    Intake/Output from previous day: 12/11 0701 - 12/12 0700 In: 1481.4 [P.O.:540; I.V.:941.4] Out: 3135 [Urine:2925; Chest Tube:210] Intake/Output this shift: No intake/output data recorded.  General appearance: alert and cooperative Neurologic: intact Heart: regular rate and rhythm, S1, S2 normal, no murmur, click, rub or gallop Lungs: clear to auscultation bilaterally Abdomen: soft, non-tender; bowel sounds normal; no masses,  no organomegaly Extremities: edema mild Wound: c/d/i  Lab Results: Recent Labs    03/14/20 0356 03/15/20 0455  WBC 20.4* 12.7*  HGB 9.1* 9.1*  HCT 27.8* 27.8*  PLT 119* 116*   BMET:  Recent Labs    03/14/20 1534 03/15/20 0455  NA 140 140  K 3.8 3.7  CL 102 100  CO2 29 31  GLUCOSE 168* 119*  BUN 36* 40*  CREATININE 1.84* 1.98*  CALCIUM 7.0* 7.3*    PT/INR:  Recent Labs    03/15/20 0455  LABPROT 15.7*  INR 1.3*   ABG    Component Value Date/Time   PHART 7.373 03/12/2020 1034   HCO3 26.1 03/12/2020 1034   TCO2 23 03/12/2020 1658   ACIDBASEDEF 2.0 03/12/2020 0909   O2SAT 68.9 03/15/2020 0455   CBG (last 3)  Recent Labs    03/14/20 2333 03/15/20 0359 03/15/20 0759  GLUCAP 83 93 100*     Assessment/Plan: S/P Procedure(s) (LRB): TRANSESOPHAGEAL ECHOCARDIOGRAM (TEE) (N/A) REPAIR OF ACUTE ASCENDING THORACIC AORTIC DISSECTION USING A HEMASHIELD PLATINUM 26MM STRAIGHT GRAFT AND A HEASHIELD PLATINUM 28X10MM SINGLE ARM GRAFT (N/A) MITRAL VALVE REPAIR (MVR) USING MEMO 4D SIZE 32 RING (N/A) Mobilize Diuresis Plan for transfer to step-down: see transfer orders   LOS: 4 days    Patrick Bell 03/15/2020

## 2020-03-15 NOTE — Plan of Care (Signed)
  Problem: Clinical Measurements: Goal: Ability to maintain clinical measurements within normal limits will improve Outcome: Progressing Goal: Will remain free from infection Outcome: Progressing Goal: Diagnostic test results will improve Outcome: Progressing Goal: Respiratory complications will improve Outcome: Progressing Goal: Cardiovascular complication will be avoided Outcome: Progressing   Problem: Pain Managment: Goal: General experience of comfort will improve Outcome: Progressing   Problem: Elimination: Goal: Will not experience complications related to bowel motility Outcome: Progressing Goal: Will not experience complications related to urinary retention Outcome: Progressing   Problem: Activity: Goal: Risk for activity intolerance will decrease Outcome: Progressing   Problem: Respiratory: Goal: Respiratory status will improve Outcome: Progressing   Problem: Urinary Elimination: Goal: Ability to achieve and maintain adequate renal perfusion and functioning will improve Outcome: Progressing

## 2020-03-16 ENCOUNTER — Inpatient Hospital Stay (HOSPITAL_COMMUNITY): Payer: Medicare Other

## 2020-03-16 LAB — CBC
HCT: 26.9 % — ABNORMAL LOW (ref 39.0–52.0)
Hemoglobin: 8.9 g/dL — ABNORMAL LOW (ref 13.0–17.0)
MCH: 30 pg (ref 26.0–34.0)
MCHC: 33.1 g/dL (ref 30.0–36.0)
MCV: 90.6 fL (ref 80.0–100.0)
Platelets: 145 10*3/uL — ABNORMAL LOW (ref 150–400)
RBC: 2.97 MIL/uL — ABNORMAL LOW (ref 4.22–5.81)
RDW: 15 % (ref 11.5–15.5)
WBC: 11.3 10*3/uL — ABNORMAL HIGH (ref 4.0–10.5)
nRBC: 0 % (ref 0.0–0.2)

## 2020-03-16 LAB — COMPREHENSIVE METABOLIC PANEL
ALT: 24 U/L (ref 0–44)
AST: 30 U/L (ref 15–41)
Albumin: 2.2 g/dL — ABNORMAL LOW (ref 3.5–5.0)
Alkaline Phosphatase: 61 U/L (ref 38–126)
Anion gap: 11 (ref 5–15)
BUN: 41 mg/dL — ABNORMAL HIGH (ref 8–23)
CO2: 32 mmol/L (ref 22–32)
Calcium: 7.7 mg/dL — ABNORMAL LOW (ref 8.9–10.3)
Chloride: 98 mmol/L (ref 98–111)
Creatinine, Ser: 2.02 mg/dL — ABNORMAL HIGH (ref 0.61–1.24)
GFR, Estimated: 33 mL/min — ABNORMAL LOW (ref >60)
Glucose, Bld: 101 mg/dL — ABNORMAL HIGH (ref 70–99)
Potassium: 3.7 mmol/L (ref 3.5–5.1)
Sodium: 141 mmol/L (ref 135–145)
Total Bilirubin: 1 mg/dL (ref 0.3–1.2)
Total Protein: 4.9 g/dL — ABNORMAL LOW (ref 6.5–8.1)

## 2020-03-16 LAB — GLUCOSE, CAPILLARY
Glucose-Capillary: 96 mg/dL (ref 70–99)
Glucose-Capillary: 131 mg/dL — ABNORMAL HIGH (ref 70–99)
Glucose-Capillary: 115 mg/dL — ABNORMAL HIGH (ref 70–99)

## 2020-03-16 LAB — PROTIME-INR
INR: 1.9 — ABNORMAL HIGH (ref 0.8–1.2)
Prothrombin Time: 21.5 seconds — ABNORMAL HIGH (ref 11.4–15.2)

## 2020-03-16 LAB — MAGNESIUM: Magnesium: 2 mg/dL (ref 1.7–2.4)

## 2020-03-16 MED ORDER — MAGNESIUM SULFATE 2 GM/50ML IV SOLN
2.0000 g | Freq: Once | INTRAVENOUS | Status: AC
  Administered 2020-03-16: 10:00:00 2 g via INTRAVENOUS
  Filled 2020-03-16: qty 50

## 2020-03-16 MED ORDER — BENZONATATE 100 MG PO CAPS: 200.0000 mg | ORAL_CAPSULE | Freq: Three times a day (TID) | ORAL | Status: DC | PRN

## 2020-03-16 MED ORDER — WARFARIN SODIUM 2 MG PO TABS
2.0000 mg | ORAL_TABLET | Freq: Every day | ORAL | Status: DC
  Administered 2020-03-16: 15:00:00 2 mg via ORAL
  Filled 2020-03-16: qty 1

## 2020-03-16 MED ORDER — METOPROLOL TARTRATE 12.5 MG HALF TABLET
12.5000 mg | ORAL_TABLET | Freq: Two times a day (BID) | ORAL | Status: DC
  Administered 2020-03-16 – 2020-03-21 (×11): 12.5 mg via ORAL
  Filled 2020-03-16 (×11): qty 1

## 2020-03-16 MED ORDER — MONTELUKAST SODIUM 10 MG PO TABS
10.0000 mg | ORAL_TABLET | Freq: Every day | ORAL | Status: DC
  Administered 2020-03-16 – 2020-03-30 (×15): 10 mg via ORAL
  Filled 2020-03-16 (×15): qty 1

## 2020-03-16 MED ORDER — AMIODARONE IV BOLUS ONLY 150 MG/100ML
150.0000 mg | Freq: Once | INTRAVENOUS | Status: AC
  Administered 2020-03-16: 10:00:00 150 mg via INTRAVENOUS
  Filled 2020-03-16: qty 100

## 2020-03-16 MED ORDER — POTASSIUM CHLORIDE 10 MEQ/50ML IV SOLN
10.0000 meq | INTRAVENOUS | Status: AC
  Administered 2020-03-16 (×4): 10 meq via INTRAVENOUS
  Filled 2020-03-16 (×4): qty 50

## 2020-03-16 MED ORDER — PANTOPRAZOLE SODIUM 40 MG PO TBEC
40.0000 mg | DELAYED_RELEASE_TABLET | Freq: Every day | ORAL | Status: DC
  Administered 2020-03-16 – 2020-03-30 (×15): 40 mg via ORAL
  Filled 2020-03-16 (×15): qty 1

## 2020-03-16 MED ORDER — LOSARTAN POTASSIUM 50 MG PO TABS
50.0000 mg | ORAL_TABLET | Freq: Every evening | ORAL | Status: DC
  Administered 2020-03-16: 17:00:00 50 mg via ORAL
  Filled 2020-03-16: qty 1

## 2020-03-16 MED ORDER — SIMVASTATIN 20 MG PO TABS
10.0000 mg | ORAL_TABLET | Freq: Every day | ORAL | Status: DC
Start: 2020-03-16 — End: 2020-03-31
  Administered 2020-03-16 – 2020-03-30 (×15): 10 mg via ORAL
  Filled 2020-03-16 (×15): qty 1

## 2020-03-16 MED FILL — Heparin Sodium (Porcine) Inj 1000 Unit/ML: INTRAMUSCULAR | Qty: 10 | Status: AC

## 2020-03-16 MED FILL — Sodium Chloride IV Soln 0.9%: INTRAVENOUS | Qty: 5000 | Status: AC

## 2020-03-16 MED FILL — Mannitol IV Soln 20%: INTRAVENOUS | Qty: 500 | Status: AC

## 2020-03-16 MED FILL — Sodium Bicarbonate IV Soln 8.4%: INTRAVENOUS | Qty: 200 | Status: AC

## 2020-03-16 MED FILL — Heparin Sodium (Porcine) Inj 1000 Unit/ML: INTRAMUSCULAR | Qty: 30 | Status: AC

## 2020-03-16 NOTE — Progress Notes (Signed)
EPW removed per order. Wires intact. Pt tolerated well. Pt informed of bedrest for 1 hour. All questions answered. Call bell in reach. Will continue to monitor.  Arletta Bale, RN

## 2020-03-16 NOTE — Progress Notes (Signed)
[id number]        102 afib  AND 96% SATS Came earlier but pt had just gotten pacing wires out. Now pt is resting and not up to trying to get to recliner. Did not sleep well last night and having CT site drainage. Encouraged pt to use IS and sit up higher for pulmonary rehab. Pt initially could get IS to about 1000 ml but lower with use. Encouraged him to space out during hour and try to do slower. Wife in room and very supportive. Will continue to follow. Graylon Good RN BSN 03/16/2020 2:33 PM

## 2020-03-16 NOTE — Progress Notes (Signed)
Mobility Specialist - Progress Note   03/16/20 1341  Mobility  Activity Turned to right side;Turned to left side  Level of Assistance Maximum assist, patient does 25-49%  Assistive Device None  Mobility Response Tolerated well  Mobility performed by Mobility specialist;Nurse  $Mobility charge 1 Mobility    Pre-mobility, 4L O2: 100 HR, 125/77 BP, 94% SpO2 Post-mobility, 4L O2: 109 HR, 134/84 BP, 93% SpO2  Pt's linens found to be wet from drainage from incision site on his R side. While RN was changing the dressing, it was observed that the site would leak when he coughed. Pt was max assist to roll to the R and L as he was cleaned and linens were changed. Pt differed any further mobility.   Pricilla Handler Mobility Specialist Mobility Specialist Phone: [phone]

## 2020-03-16 NOTE — Evaluation (Signed)
Physical Therapy Evaluation Patient Details Name: Patrick Bell MRN: [id number] DOB: 1941/02/03 Today's Date: 03/16/2020   History of Present Illness  Patient is a 79 y/o male who presents s/p MVP and repair of acute ascending thoracic aortic dissection 12/8. PMH includes HTN.  Clinical Impression  Patient presents with generalized weakness, nausea, impaired balance, decreased activity tolerance and impaired mobility s/p above. Session limited due to orthostatic hypotension. Pt symptomatic with dizziness, nausea, and diaphoresis so returned pt to trendelenburg position to improve BP. RN made aware. Sp02 stayed >92% on 5L/min 02 [place] with activity and HR in 120s bpm. Requires Max A for bed mobility and Mod A to stand from EOB while adhering to precautions. Reviewed sternal precautions and "move in the tube." Would benefit from CIR to maximize independence and mobility prior to return home. Hoping pt can progress. Will follow acutely.    Follow Up Recommendations CIR;Supervision for mobility/OOB;Supervision/Assistance - 24 hour    Equipment Recommendations  Rolling walker with 5" wheels    Recommendations for Other Services Rehab consult     Precautions / Restrictions Precautions Precautions: Sternal;Other (comment) Precaution Booklet Issued: No Precaution Comments: reviewed "move in the tube" and no pushing/pulling with UEs, watch BP with change in position- orthostatic hypotension. Restrictions Weight Bearing Restrictions: Yes Other Position/Activity Restrictions: sternal precautions      Mobility  Bed Mobility Overal bed mobility: Needs Assistance Bed Mobility: Rolling;Sit to Sidelying;Supine to Sit Rolling: Mod assist   Supine to sit: Max assist;HOB elevated   Sit to sidelying: Mod assist General bed mobility comments: Mod A to roll to both sides x4 due to wet pads and to change linens; Able to bring LEs to EOB but assist with trunka nd scooting bottom to EOB while adhering  to sternal precautions. Assist to bring lEs into bed to return to supine.    Transfers Overall transfer level: Needs assistance Equipment used: Rolling walker (2 wheeled) Transfers: Sit to/from Stand Sit to Stand: Mod assist;Min assist         General transfer comment: Min-mod A to stand from elevated bed height with use of momentum and pt holding heart pillow, dizziness present. Pt with episode of dizziness and diaphoresis after standing second time.  Ambulation/Gait             General Gait Details: Unable due to drop in BP- diaphoresis, nausea and dizziness.  Stairs            Wheelchair Mobility    Modified Rankin (Stroke Patients Only)       Balance Overall balance assessment: Needs assistance Sitting-balance support: Feet supported;No upper extremity supported Sitting balance-Leahy Scale: Fair Sitting balance - Comments: Supervision for safety, prefers to lean on RW or legs for support.   Standing balance support: During functional activity Standing balance-Leahy Scale: Poor Standing balance comment: Requires UE support ins tanding. Limited standing tolerance due to dizziness, nausea and orthostasis.                             Pertinent Vitals/Pain Pain Assessment: No/denies pain (nausea)    Home Living Family/patient expects to be discharged to:: Private residence Living Arrangements: Spouse/significant other Available Help at Discharge: Family;Available 24 hours/day Type of Home: House Home Access: Stairs to enter Entrance Stairs-Rails: Left Entrance Stairs-Number of Steps: 3 Home Layout: One level Home Equipment: None      Prior Function Level of Independence: Independent  Comments: Does own ADLs/IADLs.     Hand Dominance        Extremity/Trunk Assessment   Upper Extremity Assessment Upper Extremity Assessment: Defer to OT evaluation    Lower Extremity Assessment Lower Extremity Assessment: Generalized  weakness    Cervical / Trunk Assessment Cervical / Trunk Assessment: Normal  Communication   Communication: HOH  Cognition Arousal/Alertness: Awake/alert Behavior During Therapy: WFL for tasks assessed/performed Overall Cognitive Status: Within Functional Limits for tasks assessed                                 General Comments: appears [facility] for basic mobility tasks.      General Comments General comments (skin integrity, edema, etc.): Wife present during session. Supine BP 130/74, sitting BP 97/61, sitting BP after 3 mins 115/75, standing BP 73/53, returned to supine and BP returned to 120s/80s.    Exercises     Assessment/Plan    PT Assessment Patient needs continued PT services  PT Problem List Decreased strength;Decreased mobility;Decreased knowledge of precautions;Decreased activity tolerance;Cardiopulmonary status limiting activity;Decreased balance;Decreased knowledge of use of DME       PT Treatment Interventions Therapeutic activities;Gait training;DME instruction;Therapeutic exercise;Patient/family education;Balance training;Functional mobility training;Stair training    PT Goals (Current goals can be found in the Care Plan section)  Acute Rehab PT Goals Patient Stated Goal: to feel better PT Goal Formulation: With patient Time For Goal Achievement: 03/30/20 Potential to Achieve Goals: Fair    Frequency Min 3X/week   Barriers to discharge Inaccessible home environment stairs    Co-evaluation               AM-PAC PT "6 Clicks" Mobility  Outcome Measure Help needed turning from your back to your side while in a flat bed without using bedrails?: A Lot Help needed moving from lying on your back to sitting on the side of a flat bed without using bedrails?: Total Help needed moving to and from a bed to a chair (including a wheelchair)?: A Lot Help needed standing up from a chair using your arms (e.g., wheelchair or bedside chair)?: A Lot Help  needed to walk in hospital room?: A Lot Help needed climbing 3-5 steps with a railing? : Total 6 Click Score: 10    End of Session Equipment Utilized During Treatment: Oxygen;Gait belt Activity Tolerance: Treatment limited secondary to medical complications (Comment) (orthostasis) Patient left: in bed;with call bell/phone within reach;with bed alarm set;with nursing/sitter in room Nurse Communication: Mobility status;Other (comment) (orthostasis) PT Visit Diagnosis: Muscle weakness (generalized) (M62.81);Unsteadiness on feet (R26.81);Difficulty in walking, not elsewhere classified (R26.2);Dizziness and giddiness (R42)    Time: 7517-0017 PT Time Calculation (min) (ACUTE ONLY): 45 min   Charges:   PT Evaluation $PT Eval Moderate Complexity: 1 Mod PT Treatments $Therapeutic Activity: 23-37 mins        Marisa Severin, PT, DPT Acute Rehabilitation Services Pager [phone] Office [phone]      Marguarite Arbour A Sabra Heck 03/16/2020, 12:20 PM

## 2020-03-16 NOTE — Progress Notes (Signed)
Inpatient Rehab Admissions Coordinator Note:   Per therapy recommendations, pt was screened for CIR candidacy by Clemens Catholic, [place] CCC-SLP. At this time, Pt. Participation is limited due to drop in BP and has not been seen by OT. Pt. Will need to demonstrate need for CIR level therapies in 2 or more disciplines. I will not pursue consult today, but will follow for progress with acute therapies . Please contact me with questions.   Clemens Catholic, [place], [place] Admissions Coordinator  [phone] ([place]) [phone] (office)

## 2020-03-16 NOTE — Progress Notes (Addendum)
[address]       [place] [address]             [phone]      5 Days Post-Op Procedure(s) (LRB): TRANSESOPHAGEAL ECHOCARDIOGRAM (TEE) (N/A) REPAIR OF ACUTE ASCENDING THORACIC AORTIC DISSECTION USING A HEMASHIELD PLATINUM 26MM STRAIGHT GRAFT AND A HEASHIELD PLATINUM 28X10MM SINGLE ARM GRAFT (N/A) MITRAL VALVE REPAIR (MVR) USING MEMO 4D SIZE 32 RING (N/A) Subjective: Nausea this morning, pain well controlled.   Objective: Vital signs in last 24 hours: Temp:  [97.9 F (36.6 C)-98.5 F (36.9 C)] 97.9 F (36.6 C) (12/13 0732) Pulse Rate:  [80-92] 88 (12/13 0732) Cardiac Rhythm: Normal sinus rhythm (12/12 1900) Resp:  [10-20] 16 (12/13 0732) BP: (120-165)/(64-91) 138/77 (12/13 0732) SpO2:  [93 %-100 %] 95 % (12/13 0732) Weight:  [93 kg] 93 kg (12/13 0342)     Intake/Output from previous day: 12/12 0701 - 12/13 0700 In: 361.3 [P.O.:240; I.V.:121.3] Out: 2925 [Urine:2925] Intake/Output this shift: No intake/output data recorded.  General appearance: alert, cooperative and no distress Heart: regular rate and rhythm, S1, S2 normal, no murmur, click, rub or gallop Lungs: clear to auscultation bilaterally and diminished in lower lobes Abdomen: soft, non-tender; bowel sounds normal; no masses,  no organomegaly Extremities: extremities normal, atraumatic, no cyanosis or edema and left hand edema Wound: clean and dry  Lab Results: Recent Labs    03/15/20 0455 03/16/20 0536  WBC 12.7* 11.3*  HGB 9.1* 8.9*  HCT 27.8* 26.9*  PLT 116* 145*   BMET:  Recent Labs    03/14/20 1534 03/15/20 0455  NA 140 140  K 3.8 3.7  CL 102 100  CO2 29 31  GLUCOSE 168* 119*  BUN 36* 40*  CREATININE 1.84* 1.98*  CALCIUM 7.0* 7.3*    PT/INR:  Recent Labs    03/15/20 0455  LABPROT 15.7*  INR 1.3*   ABG    Component Value Date/Time   PHART 7.373 03/12/2020 1034   HCO3 26.1 03/12/2020 1034   TCO2 23 03/12/2020 1658   ACIDBASEDEF 2.0 03/12/2020 0909    O2SAT 68.9 03/15/2020 0455   CBG (last 3)  Recent Labs    03/15/20 1605 03/15/20 2150 03/16/20 0644  GLUCAP 99 80 96    Assessment/Plan: S/P Procedure(s) (LRB): TRANSESOPHAGEAL ECHOCARDIOGRAM (TEE) (N/A) REPAIR OF ACUTE ASCENDING THORACIC AORTIC DISSECTION USING A HEMASHIELD PLATINUM 26MM STRAIGHT GRAFT AND A HEASHIELD PLATINUM 28X10MM SINGLE ARM GRAFT (N/A) MITRAL VALVE REPAIR (MVR) USING MEMO 4D SIZE 32 RING (N/A)  1. CV-NSR in the 80s-90s, BP well controlled. Continue Amio, asa, no statin.   2. Pulm-tolerating 3L [place], weaning as able. CXR showed: Right base atelectasis with ill-defined opacity concerning for developing pneumonia. Lungs otherwise clear. Stable cardiac silhouette. Central catheter tip at cavoatrial junction 3. Renal-creatinine 1.98, BMP today pending.  4. H and H 8.9/26.9, expected acute blood loss anemia 5. Endo- blood glucose well controlled.  6. Pain- Fentanyl patch ordered but couldn't find on the patient. Please remove patch today and just use tramadol for pain. Patient having nausea, zofran ordered.   Plan: Keep left hand elevated. Continue diuretics for fluid overload. Ordered PT/OT for debility. Hopefully nausea resolves today, try to only use tramadol for pain.    LOS: 5 days    Patrick Bell 03/16/2020    I have seen and examined the patient and agree with the assessment and plan as outlined.  Went back into Afib w/ HR 110 this  morning.  Otherwise looks good.  CXR looks remarkably good - there are no convincing signs of pneumonia.     Re-bolus amiodarone and start metoprolol for rate control  Supplement potassium and magnesium  Restart home dose Cozaar for hypertension  Continue Coumadin but decrease dose and d/c pacing wires before INR rises further  Mobilize  Diuresis  Watch renal function and I/O's  PT/OT consults   Patrick Alberts, MD 03/16/2020 8:28 AM

## 2020-03-17 LAB — CBC
HCT: 26.9 % — ABNORMAL LOW (ref 39.0–52.0)
Hemoglobin: 8.9 g/dL — ABNORMAL LOW (ref 13.0–17.0)
MCH: 29.8 pg (ref 26.0–34.0)
MCHC: 33.1 g/dL (ref 30.0–36.0)
MCV: 90 fL (ref 80.0–100.0)
Platelets: 180 K/uL (ref 150–400)
RBC: 2.99 MIL/uL — ABNORMAL LOW (ref 4.22–5.81)
RDW: 14.8 % (ref 11.5–15.5)
WBC: 10.6 K/uL — ABNORMAL HIGH (ref 4.0–10.5)
nRBC: 0 % (ref 0.0–0.2)

## 2020-03-17 LAB — BASIC METABOLIC PANEL WITH GFR
Anion gap: 8 (ref 5–15)
BUN: 42 mg/dL — ABNORMAL HIGH (ref 8–23)
CO2: 33 mmol/L — ABNORMAL HIGH (ref 22–32)
Calcium: 7.8 mg/dL — ABNORMAL LOW (ref 8.9–10.3)
Chloride: 98 mmol/L (ref 98–111)
Creatinine, Ser: 1.97 mg/dL — ABNORMAL HIGH (ref 0.61–1.24)
GFR, Estimated: 34 mL/min — ABNORMAL LOW (ref >60)
Glucose, Bld: 108 mg/dL — ABNORMAL HIGH (ref 70–99)
Potassium: 3.8 mmol/L (ref 3.5–5.1)
Sodium: 139 mmol/L (ref 135–145)

## 2020-03-17 LAB — PROTIME-INR
INR: 2.9 — ABNORMAL HIGH (ref 0.8–1.2)
Prothrombin Time: 29.2 s — ABNORMAL HIGH (ref 11.4–15.2)

## 2020-03-17 MED ORDER — MIDODRINE HCL 5 MG PO TABS
10.0000 mg | ORAL_TABLET | Freq: Three times a day (TID) | ORAL | Status: DC
  Administered 2020-03-17: 16:00:00 10 mg via ORAL
  Filled 2020-03-17: qty 2

## 2020-03-17 MED ORDER — POTASSIUM CHLORIDE 10 MEQ/50ML IV SOLN
10.0000 meq | INTRAVENOUS | Status: AC
Start: 2020-03-17 — End: 2020-03-17
  Administered 2020-03-17 (×4): 10 meq via INTRAVENOUS
  Filled 2020-03-17 (×4): qty 50

## 2020-03-17 NOTE — Progress Notes (Signed)
Physical Therapy Treatment Patient Details Name: Patrick Bell MRN: [id number] DOB: 02-08-1941 Today's Date: 03/17/2020    History of Present Illness Patient is a 79 y/o male who presents s/p MVP and repair of acute ascending thoracic aortic dissection 12/8. PMH includes HTN.    PT Comments    Pt very limited this session due to orthostatic hypotension.  Educated on sternal precautions pre mobility and he tolerated well.  Once in standing presents with dizziness.  Pt limited in mobility due to significant drop in BP.  Informed nursed and positioned patient back in supine.  Continue to recommend rehab in a post acute setting.    Standing after c/o dizziness: 67/56 Sitting in chair: 116/64 Standing: 44/34 Supine in bed:114/77    Follow Up Recommendations  CIR;Supervision for mobility/OOB;Supervision/Assistance - 24 hour     Equipment Recommendations       Recommendations for Other Services       Precautions / Restrictions Precautions Precautions: Sternal;Other (comment) Precaution Booklet Issued: No Precaution Comments: reviewed "move in the tube" and no pushing/pulling with UEs, watch BP with change in position- orthostatic hypotension. Restrictions Weight Bearing Restrictions: Yes (sternal precautions.)    Mobility  Bed Mobility Overal bed mobility: Needs Assistance Bed Mobility: Rolling;Sit to Sidelying;Sidelying to Sit Rolling: Mod assist;+2 for physical assistance Sidelying to sit: Mod assist;+2 for physical assistance     Sit to sidelying: Mod assist;+2 for physical assistance General bed mobility comments: Pt required cues for rolling to move to edge of bed and assistance for trunk control to rise into sitting.  Pt required lifting of legs back to bed against gravity, and use of bed pad to scoot to [facility].  Transfers Overall transfer level: Needs assistance Equipment used: Rolling walker (2 wheeled);None Transfers: Sit to/from Stand Sit to Stand: Min assist;+2  physical assistance;Mod assist         General transfer comment: Min to mod A +2.  As  he became dizzy required increased assistance.  Pt with another episode of dizziness during session.  Ambulation/Gait Ambulation/Gait assistance: Min assist;+2 physical assistance Gait Distance (Feet): 8 Feet (from bed to sink, did not walk back to bed due to soft BP in standing.) Assistive device: Rolling walker (2 wheeled) Gait Pattern/deviations: Decreased stride length;Trunk flexed     General Gait Details: Cues for upper trunk control.   Stairs             Wheelchair Mobility    Modified Rankin (Stroke Patients Only)       Balance Overall balance assessment: Needs assistance Sitting-balance support: Feet supported;No upper extremity supported Sitting balance-Leahy Scale: Fair       Standing balance-Leahy Scale: Poor                              Cognition Arousal/Alertness: Awake/alert Behavior During Therapy: WFL for tasks assessed/performed Overall Cognitive Status: Within Functional Limits for tasks assessed                                        Exercises      General Comments        Pertinent Vitals/Pain Pain Assessment: Faces Faces Pain Scale: Hurts little more Pain Location: R side - site of tube removal Pain Descriptors / Indicators: Tender;Discomfort;Grimacing Pain Intervention(s): Monitored during session;Repositioned    Home Living  Prior Function            PT Goals (current goals can now be found in the care plan section) Acute Rehab PT Goals Patient Stated Goal: to feel better Potential to Achieve Goals: Fair Progress towards PT goals: Progressing toward goals    Frequency    Min 3X/week      PT Plan Current plan remains appropriate    Co-evaluation PT/OT/SLP Co-Evaluation/Treatment: Yes Reason for Co-Treatment: Complexity of the patient's impairments (multi-system  involvement);For patient/therapist safety (low BPs) PT goals addressed during session: Mobility/safety with mobility OT goals addressed during session: ADL's and self-care      AM-PAC PT "6 Clicks" Mobility   Outcome Measure  Help needed turning from your back to your side while in a flat bed without using bedrails?: A Lot Help needed moving from lying on your back to sitting on the side of a flat bed without using bedrails?: A Lot Help needed moving to and from a bed to a chair (including a wheelchair)?: A Lot Help needed standing up from a chair using your arms (e.g., wheelchair or bedside chair)?: A Lot Help needed to walk in hospital room?: A Little Help needed climbing 3-5 steps with a railing? : A Lot 6 Click Score: 13    End of Session Equipment Utilized During Treatment: Oxygen;Gait belt Activity Tolerance: Treatment limited secondary to medical complications (Comment) Patient left: in bed;with call bell/phone within reach;with bed alarm set;with nursing/sitter in room Nurse Communication: Mobility status;Other (comment) (low systolic BPs) PT Visit Diagnosis: Muscle weakness (generalized) (M62.81);Unsteadiness on feet (R26.81);Difficulty in walking, not elsewhere classified (R26.2);Dizziness and giddiness (R42)     Time: 6546-5035 PT Time Calculation (min) (ACUTE ONLY): 38 min  Charges:  $Therapeutic Activity: 8-22 mins                     Erasmo Leventhal , PTA Acute Rehabilitation Services Pager [phone] Office [phone]     Gionni Vaca Eli Hose 03/17/2020, 2:49 PM

## 2020-03-17 NOTE — Progress Notes (Signed)
CARDIAC REHAB PHASE I   PRE:  Rate/Rhythm: 96 aflutter    BP: sitting 98/73    SaO2: 93 4L  MODE:  Ambulation: to BSC   POST:  Rate/Rhythm: 106 afib  Came to ambulate however pt had had diarrhea in bed. Assisted to [facility] with mod assist and verbal cues. Stood well, just anxious regarding stool. Left on BSC, vitals controlled today. No c/o except diarrhea. Has call bell and RN aware. [id number]  [place], ACSM 03/17/2020 11:14 AM

## 2020-03-17 NOTE — Evaluation (Signed)
Occupational Therapy Evaluation Patient Details Name: Patrick Bell MRN: [id number] DOB: Jan 12, 1941 Today's Date: 03/17/2020    History of Present Illness Patient is a 79 y/o male who presents s/p MVP and repair of acute ascending thoracic aortic dissection 12/8. PMH includes HTN.   Clinical Impression   Pt lives at home with his wife and is typically independent in ADL and mobility and also IADL. Today Pt is very limited by orthostatics. Pt still very determined to participate with therapy. Able to perform bed mobility at mod A +2 safety, transfers at min A +2 and standing at sink for grooming. Pt verbalized dizziness, and BP was 67/56. Pt immediately put in chair and after seated rest break with further education on "move in the tube" terminology" Pt BP was 116/64. Stood again to complete oral care and in BO dropped to 44/34. Pt immediately returned to sitting, and then supine as soon as possible. Supine Pt's BP returned to 114/77. SpO2 95% on 3L via [place] (hard to get a read with finger pulse-ox) Pt educated and performed ROM at hand end elbow as BUE with noticeable edema.   Of note: Scrotum swollen, and OT used a pillowcase to elevate, educated RN to place a towel underneath (after off the bed pan) to assist with edema management - will continue to monitor for need of scrotal sling.   OT will continue to follow acutely AT this time recommending CIR level therapy post-acute so that Pt can return to PLOF and to maximize safety and independence in ADL and functional transfers.     Follow Up Recommendations  CIR    Equipment Recommendations  3 in 1 bedside commode    Recommendations for Other Services Rehab consult     Precautions / Restrictions Precautions Precautions: Sternal;Other (comment) Precaution Booklet Issued: No Precaution Comments: reviewed "move in the tube" and no pushing/pulling with UEs, watch BP with change in position- orthostatic hypotension. Restrictions Weight  Bearing Restrictions: Yes (sternal precautions) Other Position/Activity Restrictions: Sternal precautions      Mobility Bed Mobility Overal bed mobility: Needs Assistance Bed Mobility: Rolling;Sit to Sidelying;Sidelying to Sit Rolling: Mod assist;+2 for physical assistance Sidelying to sit: Mod assist;+2 for physical assistance     Sit to sidelying: Mod assist;+2 for physical assistance General bed mobility comments: Pt required cues for rolling to move to edge of bed and assistance for trunk control to rise into sitting.  Pt required lifting of legs back to bed against gravity, and use of bed pad to scoot to [facility].    Transfers Overall transfer level: Needs assistance Equipment used: Rolling walker (2 wheeled);None Transfers: Sit to/from Stand Sit to Stand: Min assist;+2 physical assistance;Mod assist         General transfer comment: Min to mod A +2.  As  he became dizzy required increased assistance.  Pt with another episode of dizziness during session.    Balance Overall balance assessment: Needs assistance Sitting-balance support: Feet supported;No upper extremity supported Sitting balance-Leahy Scale: Fair Sitting balance - Comments: Supervision for safety, prefers to lean on RW or legs for support.   Standing balance support: During functional activity Standing balance-Leahy Scale: Poor Standing balance comment: Requires UE support in standing. Limited standing tolerance due to dizziness, nausea and orthostasis.                           ADL either performed or assessed with clinical judgement   ADL Overall ADL's :  Needs assistance/impaired Eating/Feeding: Set up   Grooming: Min guard;Standing;Oral care Grooming Details (indicate cue type and reason): Pt will need further education on compensatory strategies for grooming tasks and "staying in the tube" Upper Body Bathing: Moderate assistance   Lower Body Bathing: Moderate assistance Lower Body Bathing  Details (indicate cue type and reason): knees down Upper Body Dressing : Moderate assistance   Lower Body Dressing: Maximal assistance   Toilet Transfer: Minimal assistance;+2 for physical assistance;+2 for safety/equipment;Ambulation;RW Toilet Transfer Details (indicate cue type and reason): rocking motion for momentum Toileting- Clothing Manipulation and Hygiene: Moderate assistance       Functional mobility during ADLs: Minimal assistance;+2 for safety/equipment;Rolling walker;Cueing for safety General ADL Comments: Pt orthostatic today, needs further education for compensatory strategies and "move in the tube"     Vision Baseline Vision/History: Wears glasses Wears Glasses: At all times Patient Visual Report: No change from baseline       Perception     Praxis      Pertinent Vitals/Pain Pain Assessment: Faces Faces Pain Scale: Hurts little more Pain Location: R side - site of tube removal Pain Descriptors / Indicators: Tender;Discomfort;Grimacing Pain Intervention(s): Monitored during session;Repositioned     Hand Dominance Right   Extremity/Trunk Assessment Upper Extremity Assessment Upper Extremity Assessment: Generalized weakness (edema in BUE causing deficits in fine motor coordination)   Lower Extremity Assessment Lower Extremity Assessment: Defer to PT evaluation   Cervical / Trunk Assessment Cervical / Trunk Assessment: Normal   Communication Communication Communication: HOH   Cognition Arousal/Alertness: Awake/alert Behavior During Therapy: WFL for tasks assessed/performed Overall Cognitive Status: Within Functional Limits for tasks assessed                                     General Comments  BP were as follows: standing: 67/56  sitting: 116/64  standing: 44/34  supine: 114/77    98% on 3L via [place]    Exercises Exercises: Other exercises Other Exercises Other Exercises: digit flexion extension for edema in hands   Shoulder  Instructions      Home Living Family/patient expects to be discharged to:: Private residence Living Arrangements: Spouse/significant other Available Help at Discharge: Family;Available 24 hours/day Type of Home: House Home Access: Stairs to enter [employer] of Steps: 3 Entrance Stairs-Rails: Left Home Layout: One level               Home Equipment: None          Prior Functioning/Environment Level of Independence: Independent        Comments: Does own ADLs/IADLs.        OT Problem List: Decreased activity tolerance;Impaired balance (sitting and/or standing);Decreased knowledge of use of DME or AE;Decreased knowledge of precautions;Cardiopulmonary status limiting activity;Pain;Increased edema      OT Treatment/Interventions: Self-care/ADL training;Energy conservation;DME and/or AE instruction;Therapeutic activities;Patient/family education;Balance training    OT Goals(Current goals can be found in the care plan section) Acute Rehab OT Goals Patient Stated Goal: to feel better OT Goal Formulation: With patient Time For Goal Achievement: 03/31/20 Potential to Achieve Goals: Good ADL Goals Pt Will Perform Grooming: with modified independence;standing Pt Will Perform Upper Body Dressing: with modified independence;sitting Pt Will Perform Lower Body Dressing: with set-up;sit to/from stand;with caregiver independent in assisting Pt Will Transfer to Toilet: with modified independence;ambulating Pt Will Perform Toileting - Clothing Manipulation and hygiene: with supervision;sit to/from stand Additional ADL Goal #1: Pt  will perform bed mobility maintaining sternal precautions at mod I level prior to engaging in ADL  OT Frequency: Min 2X/week   Barriers to D/C:            Co-evaluation PT/OT/SLP Co-Evaluation/Treatment: Yes Reason for Co-Treatment: Complexity of the patient's impairments (multi-system involvement);For patient/therapist safety (low BPs) PT  goals addressed during session: Mobility/safety with mobility OT goals addressed during session: ADL's and self-care      AM-PAC OT "6 Clicks" Daily Activity     Outcome Measure Help from another person eating meals?: A Little Help from another person taking care of personal grooming?: A Little Help from another person toileting, which includes using toliet, bedpan, or urinal?: A Lot Help from another person bathing (including washing, rinsing, drying)?: A Lot Help from another person to put on and taking off regular upper body clothing?: A Lot Help from another person to put on and taking off regular lower body clothing?: A Lot 6 Click Score: 14   End of Session Equipment Utilized During Treatment: Oxygen;Rolling walker (3L) Nurse Communication: Mobility status (check condom catheter)  Activity Tolerance: Treatment limited secondary to medical complications (Comment) (orthostatic) Patient left: in bed;with call bell/phone within reach;with nursing/sitter in room  OT Visit Diagnosis: Other abnormalities of gait and mobility (R26.89);Muscle weakness (generalized) (M62.81)                Time: 5300-5110 OT Time Calculation (min): 38 min Charges:  OT General Charges $OT Visit: 1 Visit OT Evaluation $OT Eval Moderate Complexity: [place] OTR/L Acute Rehabilitation Services Pager: [phone] Office: [place] 03/17/2020, 3:59 PM

## 2020-03-17 NOTE — TOC Initial Note (Signed)
Transition of Care (TOC) - Initial/Assessment Note  Marvetta Gibbons RN, BSN Transitions of Care Unit 4E- RN Case Manager See Treatment Team for direct phone #    Patient Details  Name: Patrick Bell MRN: [id number] Date of Birth: 08-30-1940  Transition of Care [facility]) CM/SW Contact:    Dawayne Patricia, RN Phone Number: 03/17/2020, 4:51 PM  Clinical Narrative:                 Pt from ome with spouse- orders placed for HPT/OT and DME- RW, per pt he wants to return home. Choice offered for [facility] Per CMS guidelines from medicare.gov website with star ratings (copy placed in shadow chart)- per pt he does not have a preference for [facility] agency and defers to [facility] to secure agency for needed services.  Call made to [facility] for [facility] referral- referral has been accepted for HHPT/OT,  Will call [employer] for RW prior to discharge and have delivered to room    Expected Discharge Plan: [place] Barriers to Discharge: Continued Medical Work up   Patient Goals and CMS Choice Patient states their goals for this hospitalization and ongoing recovery are:: return home CMS Medicare.gov Compare Post Acute Care list provided to:: Patient Choice offered to / list presented to : Patient  Expected Discharge Plan and Services Expected Discharge Plan: [place] In-house Referral: Clinical Social Work Discharge Planning Services: CM Consult Post Acute Care Choice: [place] arrangements for the past 2 months: [place]                 DME Arranged: Walker rolling DME Agency: [facility]       HH Arranged: PT,OT [place] Agency: [place] ([place]) Date [place]: 03/17/20 Time HH Agency Contacted: 1500 Representative spoke with at [place]: [place] Arrangements/Services Living arrangements for the past 2 months: [place] with:: Spouse Patient language and need for interpreter  reviewed:: Yes Do you feel safe going back to the place where you live?: Yes      Need for Family Participation in Patient Care: Yes (Comment) Care giver support system in place?: Yes (comment)   Criminal Activity/Legal Involvement Pertinent to Current Situation/Hospitalization: No - Comment as needed  Activities of Daily Living Home Assistive Devices/Equipment: None ADL Screening (condition at time of admission) Patient's cognitive ability adequate to safely complete daily activities?: Yes Is the patient deaf or have difficulty hearing?: No Does the patient have difficulty seeing, even when wearing glasses/contacts?: No Does the patient have difficulty concentrating, remembering, or making decisions?: No Patient able to express need for assistance with ADLs?: Yes Does the patient have difficulty dressing or bathing?: Yes Independently performs ADLs?: No Communication: Independent Dressing (OT): Needs assistance Is this a change from baseline?: Change from baseline, expected to last >3 days Grooming: Independent Feeding: Independent Bathing: Needs assistance Is this a change from baseline?: Change from baseline, expected to last >3 days Toileting: Needs assistance Is this a change from baseline?: Change from baseline, expected to last >3days In/Out Bed: Needs assistance Is this a change from baseline?: Change from baseline, expected to last >3 days Walks in Home: Independent Does the patient have difficulty walking or climbing stairs?: Yes Weakness of Legs: Both Weakness of Arms/Hands: Both  Permission Sought/Granted Permission sought to share information with : Facility Art therapist granted to share information with : Yes, Verbal Permission Granted  Permission granted to share info w AGENCY: HH        Emotional Assessment Appearance:: Appears stated age Attitude/Demeanor/Rapport: Engaged Affect (typically observed): Appropriate Orientation: :  Oriented to Self,Oriented to Place,Oriented to  Time,Oriented to Situation Alcohol / Substance Use: Not Applicable Psych Involvement: No (comment)  Admission diagnosis:  S/P mitral valve repair [Q28.638] Patient Active Problem List   Diagnosis Date Noted  . S/P mitral valve repair 03/11/2020  . S/P aortic dissection repair 03/11/2020  . Acute thoracic aortic dissection (HCC) 03/11/2020  . Severe mitral regurgitation by prior echocardiogram   . Choking 02/14/2018  . Deviated septum 02/14/2018  . Nasal turbinate hypertrophy 02/14/2018  . BPH (benign prostatic hyperplasia) 07/29/2014  . Low serum vitamin D 03/14/2014  . Hyperlipidemia   . GERD (gastroesophageal reflux disease) 08/03/2012  . Essential hypertension, benign 08/19/2008  . DIVERTICULAR DISEASE 08/18/2008  . BACK PAIN, CHRONIC 08/18/2008  . OSA (obstructive sleep apnea) 08/18/2008  . PREMATURE VENTRICULAR CONTRACTIONS 07/23/2008  . Asthma 07/23/2008   PCP:  Dettinger, Fransisca Kaufmann, MD Pharmacy:   [place], [place] [address] Phone: [phone] Fax: [phone]     Social Determinants of Health (SDOH) Interventions    Readmission Risk Interventions No flowsheet data found.

## 2020-03-17 NOTE — Progress Notes (Signed)
Mobility Specialist - Progress Note   03/17/20 1339  Mobility  Activity Ambulated in room  Level of Assistance Moderate assist, patient does 50-74%  Assistive Device Front wheel walker  Distance Ambulated (ft) 20 ft  Mobility Response Tolerated well  Mobility performed by Mobility specialist  $Mobility charge 1 Mobility    Pre-mobility, 2L O2: 108 HR, 129/60 BP, 100% SpO2 Post-mobility, 2L O2: 102 HR, 123/67 BP, 95% SpO2  Pt mod assist to elevate trunk when sitting on edge of bed. Mod assist and bed height lifted in order to stand. Standby during ambulation. Pt onto [facility] after walk due to diarrhea, then assisted to pivot into bed.   Pricilla Handler Mobility Specialist Mobility Specialist Phone: [phone]

## 2020-03-17 NOTE — Progress Notes (Addendum)
[address]       [place] [address]             [phone]      6 Days Post-Op Procedure(s) (LRB): TRANSESOPHAGEAL ECHOCARDIOGRAM (TEE) (N/A) REPAIR OF ACUTE ASCENDING THORACIC AORTIC DISSECTION USING A HEMASHIELD PLATINUM 26MM STRAIGHT GRAFT AND A HEASHIELD PLATINUM 28X10MM SINGLE ARM GRAFT (N/A) MITRAL VALVE REPAIR (MVR) USING MEMO 4D SIZE 32 RING (N/A) Subjective: Feels fair, some discomfort from sitting on sacral area  Objective: Vital signs in last 24 hours: Temp:  [97.6 F (36.4 C)-98.3 F (36.8 C)] 98.3 F (36.8 C) (12/13 2322) Pulse Rate:  [88-110] 107 (12/14 0351) Cardiac Rhythm: Atrial fibrillation (12/13 1900) Resp:  [13-18] 16 (12/14 0351) BP: (94-138)/(63-77) 117/73 (12/14 0351) SpO2:  [94 %-97 %] 95 % (12/14 0351) Weight:  [92.7 kg] 92.7 kg (12/14 0500)  Hemodynamic parameters for last 24 hours:    Intake/Output from previous day: 12/13 0701 - 12/14 0700 In: 900.1 [P.O.:600; I.V.:100.1; IV Piggyback:200] Out: 1300 [Urine:1300] Intake/Output this shift: No intake/output data recorded.  General appearance: alert, cooperative, fatigued and no distress Heart: irregularly irregular rhythm Lungs: mildly dim in bases Abdomen: benign Extremities: mild edema- left hand is edematous Wound: incis healing well  Lab Results: Recent Labs    03/16/20 0536 03/17/20 0350  WBC 11.3* 10.6*  HGB 8.9* 8.9*  HCT 26.9* 26.9*  PLT 145* 180   BMET:  Recent Labs    03/16/20 0536 03/17/20 0350  NA 141 139  K 3.7 3.8  CL 98 98  CO2 32 33*  GLUCOSE 101* 108*  BUN 41* 42*  CREATININE 2.02* 1.97*  CALCIUM 7.7* 7.8*    PT/INR:  Recent Labs    03/17/20 0350  LABPROT 29.2*  INR 2.9*   ABG    Component Value Date/Time   PHART 7.373 03/12/2020 1034   HCO3 26.1 03/12/2020 1034   TCO2 23 03/12/2020 1658   ACIDBASEDEF 2.0 03/12/2020 0909   O2SAT 68.9 03/15/2020 0455   CBG (last 3)  Recent Labs    03/16/20 0644 03/16/20 1052  03/16/20 1642  GLUCAP 96 131* 115*    Meds Scheduled Meds: . amiodarone  400 mg Oral BID  . bisacodyl  10 mg Oral Daily   Or  . bisacodyl  10 mg Rectal Daily  . Chlorhexidine Gluconate Cloth  6 each Topical Daily  . colchicine  0.3 mg Oral BID  . docusate sodium  200 mg Oral Daily  . furosemide  40 mg Intravenous BID  . mouth rinse  15 mL Mouth Rinse BID  . metoprolol tartrate  12.5 mg Oral BID  . montelukast  10 mg Oral QHS  . pantoprazole  40 mg Oral QHS  . simvastatin  10 mg Oral QHS  . Warfarin - Physician Dosing Inpatient   Does not apply q1600   Continuous Infusions: . potassium chloride     PRN Meds:.benzonatate, metoprolol tartrate, ondansetron (ZOFRAN) IV, oxyCODONE, traMADol  Xrays DG Chest 2 View  Result Date: 03/16/2020 CLINICAL DATA:  Status post mitral valve repair.  Atelectasis. EXAM: CHEST - 2 VIEW COMPARISON:  March 15, 2020 FINDINGS: There is ill-defined opacity in the right base with atelectasis and questionable developing focus of infiltrate. Lungs elsewhere clear. Heart is upper normal in size with pulmonary vascularity normal. Patient is status post mitral valve repair. Central catheter tip is at the cavoatrial junction. No pneumothorax. Temporary pacemaker wires attached to right heart. IMPRESSION: Right  base atelectasis with ill-defined opacity concerning for developing pneumonia. Lungs otherwise clear. Stable cardiac silhouette. Central catheter tip at cavoatrial junction. Electronically Signed   By: Lowella Grip III M.D.   On: 03/16/2020 07:55    Assessment/Plan: S/P Procedure(s) (LRB): TRANSESOPHAGEAL ECHOCARDIOGRAM (TEE) (N/A) REPAIR OF ACUTE ASCENDING THORACIC AORTIC DISSECTION USING A HEMASHIELD PLATINUM 26MM STRAIGHT GRAFT AND A HEASHIELD PLATINUM 28X10MM SINGLE ARM GRAFT (N/A) MITRAL VALVE REPAIR (MVR) USING MEMO 4D SIZE 32 RING (N/A)  1 afeb VSS- SBP 94-138, afib , rate control pretty good, tachy at times 2 sats good on 4 liters [place] 3  expected ABL anemia is stable 4 leukocytosis trend conts to improve 5 thrombocytopenia resolved 6 creat is stable, slightly improved, follow closely , cozaar not restarted 7 INR 2.9- hold coumadin 8 mild hypokalemia -replace 9 BS well controlled 10 conts diuretic for volume overload- weight slowly trending down, good UOP 11 left hand swelling - could consider eval for DVT but already therapeudic on coumadin 12 push rrehab as able  LOS: 6 days    John Giovanni PA-C Pager [phone] 03/17/2020   I have seen and examined the patient and agree with the assessment and plan as outlined.  Rexene Alberts, MD 03/17/2020 10:05 AM

## 2020-03-18 LAB — BASIC METABOLIC PANEL
Anion gap: 7 (ref 5–15)
BUN: 41 mg/dL — ABNORMAL HIGH (ref 8–23)
CO2: 33 mmol/L — ABNORMAL HIGH (ref 22–32)
Calcium: 7.9 mg/dL — ABNORMAL LOW (ref 8.9–10.3)
Chloride: 99 mmol/L (ref 98–111)
Creatinine, Ser: 1.65 mg/dL — ABNORMAL HIGH (ref 0.61–1.24)
GFR, Estimated: 42 mL/min — ABNORMAL LOW (ref >60)
Glucose, Bld: 109 mg/dL — ABNORMAL HIGH (ref 70–99)
Potassium: 3.4 mmol/L — ABNORMAL LOW (ref 3.5–5.1)
Sodium: 139 mmol/L (ref 135–145)

## 2020-03-18 LAB — PROTIME-INR
INR: 2.8 — ABNORMAL HIGH (ref 0.8–1.2)
Prothrombin Time: 28.2 seconds — ABNORMAL HIGH (ref 11.4–15.2)

## 2020-03-18 LAB — MAGNESIUM: Magnesium: 2.2 mg/dL (ref 1.7–2.4)

## 2020-03-18 MED ORDER — WARFARIN SODIUM 1 MG PO TABS
1.0000 mg | ORAL_TABLET | Freq: Every day | ORAL | Status: DC
  Administered 2020-03-18 – 2020-03-19 (×2): 1 mg via ORAL
  Filled 2020-03-18 (×2): qty 1

## 2020-03-18 MED ORDER — FE FUMARATE-B12-VIT C-FA-IFC PO CAPS
1.0000 | ORAL_CAPSULE | Freq: Every day | ORAL | Status: DC
  Administered 2020-03-19 – 2020-03-31 (×11): 1 via ORAL
  Filled 2020-03-18 (×12): qty 1

## 2020-03-18 MED ORDER — LEVALBUTEROL HCL 0.63 MG/3ML IN NEBU
0.6300 mg | INHALATION_SOLUTION | Freq: Three times a day (TID) | RESPIRATORY_TRACT | Status: DC | PRN
  Administered 2020-03-18 – 2020-03-19 (×2): 0.63 mg via RESPIRATORY_TRACT
  Filled 2020-03-18 (×3): qty 3

## 2020-03-18 MED ORDER — POTASSIUM CHLORIDE 10 MEQ/50ML IV SOLN
10.0000 meq | INTRAVENOUS | Status: AC
  Administered 2020-03-18 (×6): 10 meq via INTRAVENOUS
  Filled 2020-03-18 (×6): qty 50

## 2020-03-18 MED ORDER — MAGNESIUM SULFATE 2 GM/50ML IV SOLN
2.0000 g | Freq: Once | INTRAVENOUS | Status: AC
  Administered 2020-03-18: 08:00:00 2 g via INTRAVENOUS
  Filled 2020-03-18: qty 50

## 2020-03-18 MED ORDER — POTASSIUM CHLORIDE CRYS ER 20 MEQ PO TBCR: 40.0000 meq | EXTENDED_RELEASE_TABLET | Freq: Once | ORAL | Status: DC

## 2020-03-18 NOTE — Progress Notes (Signed)
Mobility Specialist - Progress Note   03/18/20 1244  Mobility  Activity Transferred to/from [facility]  Level of Assistance Moderate assist, patient does 50-74%  Assistive Device None  Distance Ambulated (ft) 10 ft  Mobility Response Tolerated well  Mobility performed by Mobility specialist;Nurse  $Mobility charge 1 Mobility   Pre-mobility: 93 HR, 96% SpO2 Post-mobility: 98 HR, 94% SpO2  Session limited by diarrhea. He was able to stand and transfer to [facility] w/ +2 handheld assist. Pt needed assistance w/ peri care. Pt then ambulated from [facility] to bed w/ +2 handheld assist as he differed further mobility. RN took pt off his [place].    Pricilla Handler Mobility Specialist Mobility Specialist Phone: [phone]

## 2020-03-18 NOTE — Progress Notes (Addendum)
[address]       [place] [address]             [phone]      7 Days Post-Op Procedure(s) (LRB): TRANSESOPHAGEAL ECHOCARDIOGRAM (TEE) (N/A) REPAIR OF ACUTE ASCENDING THORACIC AORTIC DISSECTION USING A HEMASHIELD PLATINUM 26MM STRAIGHT GRAFT AND A HEASHIELD PLATINUM 28X10MM SINGLE ARM GRAFT (N/A) MITRAL VALVE REPAIR (MVR) USING MEMO 4D SIZE 32 RING (N/A) Subjective: Feels like"explitive"  Objective: Vital signs in last 24 hours: Temp:  [97.5 F (36.4 C)-98.5 F (36.9 C)] 98 F (36.7 C) (12/15 0347) Pulse Rate:  [95-113] 95 (12/15 0347) Cardiac Rhythm: Atrial fibrillation (12/14 1900) Resp:  [13-16] 16 (12/15 0347) BP: (104-137)/(58-99) 106/62 (12/15 0347) SpO2:  [96 %-100 %] 100 % (12/15 0347) Weight:  [95.4 kg] 95.4 kg (12/15 0536)  Hemodynamic parameters for last 24 hours:    Intake/Output from previous day: 12/14 0701 - 12/15 0700 In: 0  Out: 1300 [Urine:1300] Intake/Output this shift: No intake/output data recorded.  General appearance: alert, cooperative and no distress Heart: irregularly irregular rhythm Lungs: mild crackles in bases Abdomen: benign Extremities: edema improved Wound: incis healing well  Lab Results: Recent Labs    03/16/20 0536 03/17/20 0350  WBC 11.3* 10.6*  HGB 8.9* 8.9*  HCT 26.9* 26.9*  PLT 145* 180   BMET:  Recent Labs    03/17/20 0350 03/18/20 0407  NA 139 139  K 3.8 3.4*  CL 98 99  CO2 33* 33*  GLUCOSE 108* 109*  BUN 42* 41*  CREATININE 1.97* 1.65*  CALCIUM 7.8* 7.9*    PT/INR:  Recent Labs    03/18/20 0407  LABPROT 28.2*  INR 2.8*   ABG    Component Value Date/Time   PHART 7.373 03/12/2020 1034   HCO3 26.1 03/12/2020 1034   TCO2 23 03/12/2020 1658   ACIDBASEDEF 2.0 03/12/2020 0909   O2SAT 68.9 03/15/2020 0455   CBG (last 3)  Recent Labs    03/16/20 0644 03/16/20 1052 03/16/20 1642  GLUCAP 96 131* 115*    Meds Scheduled Meds: . amiodarone  400 mg Oral BID  .  bisacodyl  10 mg Oral Daily   Or  . bisacodyl  10 mg Rectal Daily  . Chlorhexidine Gluconate Cloth  6 each Topical Daily  . colchicine  0.3 mg Oral BID  . docusate sodium  200 mg Oral Daily  . furosemide  40 mg Intravenous BID  . mouth rinse  15 mL Mouth Rinse BID  . metoprolol tartrate  12.5 mg Oral BID  . midodrine  10 mg Oral TID WC  . montelukast  10 mg Oral QHS  . pantoprazole  40 mg Oral QHS  . simvastatin  10 mg Oral QHS  . Warfarin - Physician Dosing Inpatient   Does not apply q1600   Continuous Infusions: PRN Meds:.benzonatate, metoprolol tartrate, ondansetron (ZOFRAN) IV, oxyCODONE, traMADol  Xrays No results found.  Assessment/Plan: S/P Procedure(s) (LRB): TRANSESOPHAGEAL ECHOCARDIOGRAM (TEE) (N/A) REPAIR OF ACUTE ASCENDING THORACIC AORTIC DISSECTION USING A HEMASHIELD PLATINUM 26MM STRAIGHT GRAFT AND A HEASHIELD PLATINUM 28X10MM SINGLE ARM GRAFT (N/A) MITRAL VALVE REPAIR (MVR) USING MEMO 4D SIZE 32 RING (N/A)  1 afeb, VSS ,  Afib/flutter /minor tachy at times, BP control is good, now on midodrine. He dis have a significant episode of orthostasis  Documented in PT note 2 sats good on 2 liters- will add xopenex prn , has h/o asthma and breathing feels tight at times  3 INR 2.8- prob hold coumadin again today 4 replace K+ , Mg++- wnl 5 BUN/Creat improving trends, weight up today if accurate, cont current lasix for now but may need less soon, UOP ok  But prob not accurate totals- edema is improved 6 BS good control PT/OT rec CIR- will get consult  LOS: 7 days    John Giovanni PA-C Pager [phone] 03/18/2020   I have seen and examined the patient and agree with the assessment and plan as outlined.  Will hold lasix today given orthostatic symptoms yesterday.  Supplement potassium and magnesium.  Restart Coumadin at reduced dose.  Patient might benefit from 1-2 weeks of intensive rehab on CIR service but should be able to go home reasonably soon as strength  recovers.  Rexene Alberts, MD 03/18/2020 8:58 AM

## 2020-03-18 NOTE — Progress Notes (Signed)
Inpatient Rehab Admissions:  Inpatient Rehab Consult received.  I met with patient at the bedside for rehabilitation assessment and to discuss goals and expectations of an inpatient rehab admission.  He is open to rehab, and would like to pursue insurance authorization.  We discussed ELOS of 10-14 days, with goals of supervision to mod I.  Pt does live with his wife who is available to assist.  I will open insurance today and continue to follow for pt progress.    Signed: Shann Medal, PT, DPT Admissions Coordinator [phone] 03/18/20  1:35 PM

## 2020-03-18 NOTE — Progress Notes (Signed)
CARDIAC REHAB PHASE I   PRE:  Rate/Rhythm: 91 afib    BP: lying 116/71, sitting 109/58, standing 94/61    SaO2: 100 2L  MODE:  Ambulation: 80 ft   POST:  Rate/Rhythm: 103 afib    BP: sitting 104/67     SaO2: 97 2L  Pt eager to walk. Not orthostatic today (see above). Pt needs verbal instructions on how to get out of bed, mod assist x2, had to use bedpad. C/o back pain sitting on EOB. Min assist to stand with gait belt, assist x2. Used RW, slow, small steps. Needed continuous verbal reminders to stand taller, c/o back pain/weakness. Increased pace on return, to recliner. "How long do I stay here". VSS. Wife present. Call bell needs repair so pt had it unplugged. Notified facilities. Encouraged IS and walking. [place], ACSM 03/18/2020 11:26 AM

## 2020-03-19 ENCOUNTER — Encounter (HOSPITAL_BASED_OUTPATIENT_CLINIC_OR_DEPARTMENT_OTHER): Payer: Medicare Other | Admitting: Cardiovascular Disease

## 2020-03-19 ENCOUNTER — Inpatient Hospital Stay (HOSPITAL_COMMUNITY): Payer: Medicare Other

## 2020-03-19 LAB — PROTIME-INR
INR: 2.6 — ABNORMAL HIGH (ref 0.8–1.2)
Prothrombin Time: 27.3 seconds — ABNORMAL HIGH (ref 11.4–15.2)

## 2020-03-19 LAB — BASIC METABOLIC PANEL
Anion gap: 11 (ref 5–15)
BUN: 33 mg/dL — ABNORMAL HIGH (ref 8–23)
CO2: 29 mmol/L (ref 22–32)
Calcium: 7.9 mg/dL — ABNORMAL LOW (ref 8.9–10.3)
Chloride: 99 mmol/L (ref 98–111)
Creatinine, Ser: 1.41 mg/dL — ABNORMAL HIGH (ref 0.61–1.24)
GFR, Estimated: 50 mL/min — ABNORMAL LOW (ref >60)
Glucose, Bld: 108 mg/dL — ABNORMAL HIGH (ref 70–99)
Potassium: 3.7 mmol/L (ref 3.5–5.1)
Sodium: 139 mmol/L (ref 135–145)

## 2020-03-19 MED ORDER — FUROSEMIDE 40 MG PO TABS: 40.0000 mg | ORAL_TABLET | Freq: Every day | ORAL | Status: DC

## 2020-03-19 MED ORDER — POTASSIUM CHLORIDE 10 MEQ/50ML IV SOLN
10.0000 meq | INTRAVENOUS | Status: AC
  Administered 2020-03-19 (×5): 10 meq via INTRAVENOUS
  Filled 2020-03-19 (×5): qty 50

## 2020-03-19 MED ORDER — FUROSEMIDE 10 MG/ML IJ SOLN
40.0000 mg | Freq: Two times a day (BID) | INTRAMUSCULAR | Status: DC
  Administered 2020-03-19: 17:00:00 40 mg via INTRAVENOUS
  Filled 2020-03-19 (×2): qty 4

## 2020-03-19 MED ORDER — POTASSIUM CHLORIDE CRYS ER 20 MEQ PO TBCR
40.0000 meq | EXTENDED_RELEASE_TABLET | Freq: Every day | ORAL | Status: DC
  Administered 2020-03-20: 09:00:00 40 meq via ORAL
  Filled 2020-03-19: qty 2

## 2020-03-19 NOTE — Progress Notes (Addendum)
[address]       [place] [address]             [phone]      8 Days Post-Op Procedure(s) (LRB): TRANSESOPHAGEAL ECHOCARDIOGRAM (TEE) (N/A) REPAIR OF ACUTE ASCENDING THORACIC AORTIC DISSECTION USING A HEMASHIELD PLATINUM 26MM STRAIGHT GRAFT AND A HEASHIELD PLATINUM 28X10MM SINGLE ARM GRAFT (N/A) MITRAL VALVE REPAIR (MVR) USING MEMO 4D SIZE 32 RING (N/A) Subjective: Feels short of breath this morning.   Objective: Vital signs in last 24 hours: Temp:  [98 F (36.7 C)-98.3 F (36.8 C)] 98.3 F (36.8 C) (12/15 2305) Pulse Rate:  [90-97] 93 (12/15 2305) Cardiac Rhythm: Atrial fibrillation (12/15 1900) Resp:  [15-20] 20 (12/15 2305) BP: (120-135)/(69-75) 122/69 (12/15 2305) SpO2:  [92 %-99 %] 94 % (12/15 2305)     Intake/Output from previous day: 12/15 0701 - 12/16 0700 In: -  Out: 575 [Urine:575] Intake/Output this shift: No intake/output data recorded.  General appearance: alert, cooperative and no distress Heart: regular rate and rhythm, S1, S2 normal, no murmur, click, rub or gallop Lungs: clear to auscultation bilaterally Abdomen: soft, non-tender; bowel sounds normal; no masses,  no organomegaly Extremities: left hand with continued edema Wound: clean and dry  Lab Results: Recent Labs    03/17/20 0350  WBC 10.6*  HGB 8.9*  HCT 26.9*  PLT 180   BMET:  Recent Labs    03/18/20 0407 03/19/20 0445  NA 139 139  K 3.4* 3.7  CL 99 99  CO2 33* 29  GLUCOSE 109* 108*  BUN 41* 33*  CREATININE 1.65* 1.41*  CALCIUM 7.9* 7.9*    PT/INR:  Recent Labs    03/19/20 0445  LABPROT 27.3*  INR 2.6*   ABG    Component Value Date/Time   PHART 7.373 03/12/2020 1034   HCO3 26.1 03/12/2020 1034   TCO2 23 03/12/2020 1658   ACIDBASEDEF 2.0 03/12/2020 0909   O2SAT 68.9 03/15/2020 0455   CBG (last 3)  Recent Labs    03/16/20 1052 03/16/20 1642  GLUCAP 131* 115*    Assessment/Plan: S/P Procedure(s) (LRB): TRANSESOPHAGEAL  ECHOCARDIOGRAM (TEE) (N/A) REPAIR OF ACUTE ASCENDING THORACIC AORTIC DISSECTION USING A HEMASHIELD PLATINUM 26MM STRAIGHT GRAFT AND A HEASHIELD PLATINUM 28X10MM SINGLE ARM GRAFT (N/A) MITRAL VALVE REPAIR (MVR) USING MEMO 4D SIZE 32 RING (N/A)  1. CV-NSR in the 80s-90s, BP well controlled. Continue Amio, asa, no statin.   2. Pulm-tolerating 1L [place], weaning as able.No recent CXR.  3. Renal-creatinine 1.41 trending down,potassium 3.7. Continue lasix 40mg  IV BID. Remains over baseline wt by about 10kg 4. H and H 8.9/26.9, expected acute blood loss anemia 5. Endo- blood glucose well controlled.  6. Pain- continue tramadol 7. On coumadin 1mg , INR 2.6   Plan: Feeling short of breath, will  Order an CXR. Rehab work-up in progress. Encouraged use of incentive spirometer.    LOS: 8 days    Patrick Bell 03/19/2020    I have seen and examined the patient and agree with the assessment and plan as outlined.  Looks good and reports feeling better.  Biggest complaint is generalized weakness which makes it very difficult to maneuver independently.  Rhythm and BP stable.  Breathing comfortably although still gets SOB with exertion.  Volume overload slowing improving.  Continue IV lasix for now and transition to oral lasix as volume overload continues to improve.  Supplement potassium.  Continue metoprolol and amiodarone.  Restart Cozaar if BP starts  trending up.  Coumadin 1 mg daily for now and watch INR.  Looks ready for d/c to CIR service but too weak to consider d/c home with home health.  I would expect that within 1-2 weeks of intensive rehab he might be ready to go home.  At some point he will need f/u CTA of chest/abd/pelvis due to his aortic dissection, but as long as he remains clinically stable but serum creatinine is elevated above preop baseline it is probably best to defer to outpatient follow-up to diminish risks of IV contrast.  We will also plan routine f/u echocardiogram in 6 weeks.  Patrick Alberts, MD 03/19/2020 9:13 AM

## 2020-03-19 NOTE — Progress Notes (Signed)
Occupational Therapy Treatment Patient Details Name: Patrick Bell MRN: [id number] DOB: [birth year] Today's Date: 03/19/2020    History of present illness Patient is a 79 y/o male who presents s/p MVP and repair of acute ascending thoracic aortic dissection 12/8. PMH includes HTN.   OT comments  Patient demonstrates progress towards all goal areas this date.  Patient standing with Min A, requiring Min Guard for in room mobility at [place] level, and is demonstrating improved activity tolerance for ADL tasks in sit and stand level.  Deficits are listed below, and continue to impact his functional status.  OT to continue with the current plan of care in the acute setting, CIR has been recommended.    Follow Up Recommendations  CIR    Equipment Recommendations  3 in 1 bedside commode    Recommendations for Other Services      Precautions / Restrictions Precautions Precautions: Sternal;Fall Restrictions Weight Bearing Restrictions: Yes RUE Weight Bearing: Touch down weight bearing LUE Weight Bearing: Touch down weight bearing Other Position/Activity Restrictions: Sternal precautions       Mobility                   Transfers   Equipment used: Rolling walker (2 wheeled);None Transfers: Sit to/from Stand Sit to Stand: Min assist              Balance   Sitting-balance support: Feet supported;No upper extremity supported Sitting balance-Leahy Scale: Fair     Standing balance support: Bilateral upper extremity supported Standing balance-Leahy Scale: Poor Standing balance comment: Requires UE support in standing.                           ADL either performed or assessed with clinical judgement   ADL   Eating/Feeding: Independent;Sitting   Grooming: Min guard;Standing;Oral care;Wash/dry hands;Wash/dry face       Lower Body Bathing: Minimal assistance;Sit to/from stand;Moderate assistance           Toilet Transfer: Energy manager;Ambulation;RW                                                                                                 General Comments  VSS.  No complaints of dizziness this session.      Pertinent Vitals/ Pain       Faces Pain Scale: Hurts even more Pain Location: low back Pain Descriptors / Indicators: Aching;Constant Pain Intervention(s): Monitored during session                                                          Frequency  Min 2X/week        Progress Toward Goals  OT Goals(current goals can now be found in the care plan section)  Progress towards OT goals: Progressing toward goals  Acute Rehab OT Goals Patient Stated Goal: I would like to have more energy  OT Goal Formulation: With patient Time For Goal Achievement: 03/31/20 Potential to Achieve Goals: Good  Plan Discharge plan remains appropriate    Co-evaluation                 AM-PAC OT "6 Clicks" Daily Activity     Outcome Measure   Help from another person eating meals?: None Help from another person taking care of personal grooming?: A Little Help from another person toileting, which includes using toliet, bedpan, or urinal?: A Little Help from another person bathing (including washing, rinsing, drying)?: A Lot Help from another person to put on and taking off regular upper body clothing?: A Little Help from another person to put on and taking off regular lower body clothing?: A Lot 6 Click Score: 17    End of Session Equipment Utilized During Treatment: Rolling walker  OT Visit Diagnosis: Other abnormalities of gait and mobility (R26.89);Muscle weakness (generalized) (M62.81)   Activity Tolerance Patient tolerated treatment well   Patient Left in chair;with call bell/phone within reach;with family/visitor present   Nurse Communication          Time: 6962-9528 OT Time Calculation (min): 21 min  Charges: OT General  Charges $OT Visit: 1 Visit OT Treatments $Self Care/Home Management : 8-22 mins  03/19/2020  Rich, OTR/L  Acute Rehabilitation Services  Office:  [phone]    Metta Clines 03/19/2020, 12:25 PM

## 2020-03-19 NOTE — Discharge Summary (Signed)
Physician Discharge Summary        [address]       [place] [address]             [phone]     Patient ID: TROI FLORENDO MRN: [id number] DOB/AGE: May 06, 1940 79 y.o.  Admit date: 03/11/2020 Discharge date: 03/31/2020  Admission Diagnoses: Patient Active Problem List   Diagnosis Date Noted   Acute thoracic aortic dissection ([place]) 03/11/2020   Severe mitral regurgitation by prior echocardiogram    Choking 02/14/2018   Deviated septum 02/14/2018   Nasal turbinate hypertrophy 02/14/2018   BPH (benign prostatic hyperplasia) 07/29/2014   Low serum vitamin D 03/14/2014   Hyperlipidemia    GERD (gastroesophageal reflux disease) 08/03/2012   Essential hypertension, benign 08/19/2008   DIVERTICULAR DISEASE 08/18/2008   BACK PAIN, CHRONIC 08/18/2008   OSA (obstructive sleep apnea) 08/18/2008   PREMATURE VENTRICULAR CONTRACTIONS 07/23/2008   Asthma 07/23/2008    Discharge Diagnoses:  Principal Problem:   S/P mitral valve repair Active Problems:   Essential hypertension, benign   OSA (obstructive sleep apnea)   GERD (gastroesophageal reflux disease)   Hyperlipidemia   Severe mitral regurgitation by prior echocardiogram   S/P aortic dissection repair   Acute thoracic aortic dissection (HCC)   Discharged Condition: good  HPI:   Patient is an 79 year old male with history of hypertension, obstructive sleep apnea, hyperlipidemia, GE reflux disease, and remote history of heavy tobacco use who has been referred for surgical consultation to discuss treatment options for management of recently discovered mitral valve prolapse with severe mitral regurgitation.   Patient has remained physically active and reasonably healthy all of his adult life.  He has been treated for essential hypertension and obstructive sleep apnea.  Several years ago he tried using CPAP but has stopped since then.  He reports a 6 to 30-month history of change in exercise  tolerance with the development of exertional shortness of breath that typically occurs only with more strenuous physical exertion.  He has blamed this on a tendency that he has had to be less physically active.  He was recently noted to have a new prominent systolic murmur on physical exam by his primary care physician.  Transthoracic echocardiogram was performed demonstrating normal left ventricular function with mitral valve prolapse and severe mitral regurgitation.  The patient was referred for cardiology consultation and recently evaluated by Dr. Claiborne Billings.  The patient subsequently underwent TEE which confirmed the presence of mitral valve prolapse with flail segment involving a portion of the posterior leaflet and severe mitral regurgitation.  Cardiothoracic surgical consultation was requested.   Patient is married and lives locally in [place] with his wife.  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  He has remained reasonably active physically throughout his adult life and in retirement.  He does not exercise on a regular basis but he regularly performs fairly strenuous activities when he is working on his property or around the house.  He states that he has noticed a tendency to get short of breath with more strenuous physical exertion.  This is developed over the past 6 to 12 months.  He denies resting shortness of breath, PND, or orthopnea.  He does note that breathing is worse when he lays flat in bed and he has also developed a persistent dry nonproductive cough.  He has had some atypical pains across the left chest which are not necessarily related to exertion.  These pains are described as a dull mild aching sensation that seem to come and go sporadically.  He has not had any palpitations or syncope.  He has had some dizzy spells that has been attributed to changes in his blood pressure medications.   Patient returns to the office today for  follow-up prior to elective mitral valve repair for mitral valve prolapse with severe primary mitral regurgitation.  He was originally seen in consultation on January 09, 2020.  Since then he underwent diagnostic cardiac catheterization by Dr. Claiborne Billings on February 21, 2020.  He reports no new problems or complaints over the past 6 weeks although he does note that he has developed what he believes is an enlarging moderate-sized pulsatile lump in his right groin since his heart catheterization.  He states that he only gets short of breath with strenuous exertion and this has not progressed.  He has not had any significant chest pain or palpitations.  He denies any fevers, chills, or productive cough.  He has not been exposed to any persons with known or suspected COVID-19 infection.  His wife did have a recent upper respiratory tract infection but she was tested negative for COVID-19.  Both have been vaccinated.    Hospital Course:   On 12/08 Mr. Wendorff underwent a mitral valve repair and repair of acute type A Aortic Dissection. The patient was transferred to the surgical ICU in stable condition. POD 1 he was extubated around 10am. He was AAI pacing with stable hemodynamics on low-dose milrinone, low dose Epi and Neo for BP support. Lasix was initiated to stimulate diuresis. POD 2, he continued to progress. We continued to wean Neo and then Epi. We started a lasix drip and placed a PICC line in anticipation that patient may need IV infusions for several more days. His anterior pleural chest tube was removed. On 12/12 he was stable to transfer up to the telemetry floor for continued care. His EPW were moved on 12/13 and his coumadin was reduced since he did have a substantial INR bump. We continued to wean his oxygen as tolerated. He did have an episode of atrial fibrillation and Amio IV bolus was given. We added Cozaar for better BP control. We continued to wean his oxygen as tolerated. He remained in atrial  fibrillation which was rate-controlled and we continued coumadin. He continued to improve but his disposition remained a challenge. Under no circumstances would he want to go to a nursing home. He is being worked up for [employer]. He is too weak to consider home with home health.  On 03/21/2019 when the patient continued to have significant issues with both weakness and blood pressure. Diuretics have been discontinued. Cardiology consultation was obtained to assist with management of atrial fibrillation and in work-up it was felt a an echocardiogram should be repeated which showed a pericardial effusion. 03/22/2020 he was taken back to the operating room for a pericardial window by Dr. Servando Snare. POD 1 from his pericardial window, he remained hemodynamically stable and in atrial fibrillation. He was rate-controlled on Amio. INR was supratherapeutic therefore we held his coumadin for a dose. We discontinued his foley catheter POD 2 and continued his pericardial drain. His pericardial drain was removed on 12/21 and on 12/23 he was stable to transfer to the telemetry unit for continued care.   We continued diuretics for fluid overload. We re-consulted CIR for evaluation as this was the recommendation of PT / OT. He remained in a stable SR. The  ECHO was repeated on 12/25 and showed no recurrence ot the pericardial effusion; there was no mitral insufficiency and EF was 60-65%.  He has been restarted on coumadin.  His most recent INR is 1.6.  He will be discharged on 3 mg of coumadin daily with a goal INR of 2.5-3.0.  His surgical incisions are healing without evidence of infection.  He is medically stable for discharge to CIR.   Consults: rehabilitation medicine  Significant Diagnostic Studies:   CLINICAL DATA:  Shortness of breath and recent heart surgery  EXAM: CHEST  1 VIEW  COMPARISON:  03/16/2020  FINDINGS: Cardiac shadow is enlarged but stable. Postsurgical changes are again noted. Left-sided PICC line  is noted extending into the right atrium. Mild bibasilar atelectasis is noted slightly increased from the prior study. No pneumothorax is seen.  IMPRESSION: Increasing bibasilar atelectasis.   Electronically Signed   By: Inez Catalina M.D.   On: 03/19/2020 08:28     ECHOCARDIOGRAM LIMITED REPORT    Patient Name:  ALEXA GOLEBIEWSKI Date of Exam: 03/28/2020  Medical Rec #: [id number]    Height:    70.0 in  Accession #:  [id number]   Weight:    200.2 lb  Date of Birth: 05-06-1940    BSA:     2.088 m  Patient Age:  34 years    BP:      126/63 mmHg  Patient Gender: M        HR:      77 bpm.  Exam Location: Inpatient   Procedure: Limited Echo, Cardiac Doppler and Color Doppler   Indications:  I31.3 Pericardial effusion (noninflammatory)    History:    Patient has prior history of Echocardiogram examinations,  most         recent 03/21/2020. Aortic Dissection, Aortic Valve  Disease,         Mitral Valve Disease and Mitral Valve Prolapse; Risk         Factors:Hypertension, Sleep Apnea and Dyslipidemia. Mitral  valve         repair. Aortic dissection repair. Pericaredial effusion.           Mitral Valve: 32 mm prosthetic annuloplasty ring valve is         present in the mitral position. Procedure Date: 03/11/2020.    Sonographer:  Roseanna Rainbow RDCS  Referring Phys: [id number] [place]    1. Limited echo for pericardial effusion. A trivial effusion is now  present. NSR is now present.  2. Left ventricular ejection fraction, by estimation, is 60 to 65%. The  left ventricle has normal function.  3. Right ventricular systolic function is normal. The right ventricular  size is normal.  4. The mitral valve has been repaired/replaced. No evidence of mitral  valve regurgitation. The mean mitral valve gradient is 5.0 mmHg with  average heart rate  of 76 bpm. There is a 32 mm prosthetic annuloplasty  ring present in the mitral position.  Procedure Date: 03/11/2020.  5. The aortic valve is grossly normal.  6. The inferior vena cava is dilated in size with >50% respiratory  variability, suggesting right atrial pressure of 8 mmHg.   Comparison(s): Changes from prior study are noted. Effusion is now  trivial.   FINDINGS  Left Ventricle: Left ventricular ejection fraction, by estimation, is 60  to 65%. The left ventricle has normal function. Abnormal (paradoxical)  septal motion consistent with post-operative status.   Right Ventricle: The right  ventricular size is normal. No increase in  right ventricular wall thickness. Right ventricular systolic function is  normal.   Left Atrium: Left atrial size was normal in size.   Right Atrium: Right atrial size was normal in size.   Pericardium: Trivial pericardial effusion is present. Presence of  pericardial fat pad.   Mitral Valve: The mitral valve has been repaired/replaced. There is a 32  mm prosthetic annuloplasty ring present in the mitral position. Procedure  Date: 03/11/2020. MV peak gradient, 10.1 mmHg. The mean mitral valve  gradient is 5.0 mmHg with average heart  rate of 76 bpm.   Tricuspid Valve: The tricuspid valve is grossly normal. Tricuspid valve  regurgitation is trivial. No evidence of tricuspid stenosis.   Aortic Valve: The aortic valve is grossly normal.   Aorta: The aortic root and ascending aorta are structurally normal, with  no evidence of dilitation.   Venous: The inferior vena cava is dilated in size with greater than 50%  respiratory variability, suggesting right atrial pressure of 8 mmHg.   IAS/Shunts: The atrial septum is grossly normal.   Additional Comments: There is a small pleural effusion in the left lateral  region.    LV Volumes (MOD)  LV vol d, MOD A2C: 98.1 ml  LV vol d, MOD A4C: 85.9 ml  LV vol s, MOD A2C: 37.4 ml  LV vol s, MOD  A4C: 32.8 ml  LV SV MOD A2C:   60.7 ml  LV SV MOD A4C:   85.9 ml  LV SV MOD BP:   55.9 ml   IVC  IVC diam: 2.50 cm     AORTA  Ao Asc diam: 2.90 cm   MITRAL VALVE  MV Peak grad: 10.1 mmHg  MV Mean grad: 5.0 mmHg  MV Vmax:   1.59 m/s  MV Vmean:   105.0 cm/s   Eleonore Chiquito MD  Electronically signed by Eleonore Chiquito MD  Signature Date/Time: 03/28/2020/4:40:44 PM     Treatments:  Date of Procedure:                03/11/2020  Preoperative Diagnosis:        Severe Mitral Regurgitation  Postoperative Diagnosis:      Severe Mitral Regurgitation  Acute Type A Aortic Dissection  Procedure:        Mitral Valve Repair             Complex valvuloplasty including artificial Gore-tex neochord placement x4             Suture plication of posterior leaflet and posteromedial commissure             Sorin Memo 4D Ring Annuloplasty (size 20mm, catalog # [id number], serial # [id number])   Repair Acute Type A Aortic Dissection             Resuspension of native aortic valve             Supracoronary straight graft repair of ascending aorta (Hemashield Platinum size 26 and 28 mm)             Open distal anastomosis               Surgeon:        Valentina Gu. Roxy Manns, MD  Assistant:       Lilia Argue. Servando Snare, MD  Anesthesia:    Midge Minium, MD  Operative Findings: ? Fibroelastic deficiency type myxomatous disease ? Multiple elongated and ruptured primary chordae tendinae ? Type  II dysfunction causing severe mitral regurgitation ? Trace central aortic insufficiency ? Normal left ventricular systolic function ? Acute type A aortic dissection complicated by rupture of aortic root immediately upon initiation of cardiopulmonary bypass ? Aortic dissection repair with reconstruction of the aortic root, resuspension of the native aortic valve, supra coronary straight graft replacement of the ascending aorta and open distal aortic anastomosis ? No residual mitral  regurgitation after successful valve repair ? Unchanged trace central aortic insufficiency after resuspension of aortic valve ? Severe coagulopathy after separation from cardiopulmonary bypass   Discharge Exam: Blood pressure (!) 107/59, pulse 84, temperature 98.1 F (36.7 C), temperature source Oral, resp. rate 16, height 5\' 10"  (1.778 m), weight 82.2 kg, SpO2 96 %.  General appearance: alert, cooperative and no distress Heart: regular rate and rhythm, S1, S2 normal, no murmur, click, rub or gallop Lungs: clear to auscultation bilaterally Abdomen: soft, non-tender; bowel sounds normal; no masses,  no organomegaly Extremities: left hand with continued edema Wound: clean and dry   Disposition:  Discharge disposition: [facility] Facility       Discharge Instructions    AMB Referral to Cardiac Rehabilitation - Phase II   Complete by: As directed    Diagnosis: Valve Repair   Valve: Mitral     Allergies as of 03/31/2020   No Known Allergies     Medication List    STOP taking these medications   benzonatate 200 MG capsule Commonly known as: TESSALON   fexofenadine-pseudoephedrine 60-120 MG 12 hr tablet Commonly known as: ALLEGRA-D   losartan 50 MG tablet Commonly known as: COZAAR     TAKE these medications   acetaminophen 500 MG tablet Commonly known as: TYLENOL Take 500 mg by mouth daily as needed (pain.).   amiodarone 200 MG tablet Commonly known as: PACERONE Take 1 tablet (200 mg total) by mouth daily. Start taking on: April 01, 2020   ferrous [id number] C-folic acid capsule Commonly known as: TRINSICON / FOLTRIN Take 1 capsule by mouth daily with breakfast. Start taking on: April 01, 2020   furosemide 40 MG tablet Commonly known as: LASIX Take 1 tablet (40 mg total) by mouth daily for 3 days. Start taking on: April 01, 2020   hydroxypropyl methylcellulose / hypromellose 2.5 % ophthalmic solution Commonly known as: ISOPTO TEARS /  GONIOVISC Place 1-2 drops into both eyes 3 (three) times daily as needed (dry/irritated eyes.).   levothyroxine 25 MCG tablet Commonly known as: SYNTHROID Take 1 tablet (25 mcg total) by mouth daily at 6 (six) AM. Start taking on: April 01, 2020   metoprolol succinate 25 MG 24 hr tablet Commonly known as: TOPROL-XL Take 1 tablet (25 mg total) by mouth daily. Start taking on: April 01, 2020   montelukast 10 MG tablet Commonly known as: SINGULAIR Take 1 tablet (10 mg total) daily by mouth. What changed:   how much to take  how to take this  when to take this  additional instructions   pantoprazole 40 MG tablet Commonly known as: PROTONIX Take 1 tablet (40 mg total) daily by mouth. What changed:   how much to take  how to take this  when to take this  additional instructions   Pataday 0.2 % Soln Generic drug: Olopatadine HCl Place 1 drop into both eyes daily as needed (allergies).   potassium chloride SA 20 MEQ tablet Commonly known as: KLOR-CON Take 1 tablet (20 mEq total) by mouth 2 (two) times daily for 3 days.   ProAir  HFA 108 (90 Base) MCG/ACT inhaler Generic drug: albuterol Inhale 2 puffs into the lungs every 6 (six) hours as needed for wheezing. What changed: See the new instructions.   simvastatin 10 MG tablet Commonly known as: ZOCOR Take 1 tablet (10 mg total) by mouth at bedtime.   traMADol 50 MG tablet Commonly known as: ULTRAM Take 1-2 tablets (50-100 mg total) by mouth every 4 (four) hours as needed for moderate pain.   warfarin 3 MG tablet Commonly known as: COUMADIN Take 1 tablet (3 mg total) by mouth daily at 4 PM.            Durable Medical Equipment  (From admission, onward)         Start     Ordered   03/17/20 0920  For home use only DME Walker rolling  Once       Question Answer Comment  Walker: With [place] Wheels   Patient needs a walker to treat with the following condition S/P MVR (mitral valve repair)       03/17/20 0920          Follow-up Information    Dettinger, Fransisca Kaufmann, MD. Call in 1 day(s).   Specialties: Family Medicine, Cardiology Contact information: [place] [address] [phone]        Rexene Alberts, MD Follow up.   Specialty: Cardiothoracic Surgery Why: Your routine follow-up appointment is on 1/10 at 1:30pm. Please arrive at 1:00pm for a chest xray located at [facility] which is on the first floor of our building. At this appointment you will also have a CTA chest, abdomen, pelvis.  Contact information: [address] [place] [address] [phone]        Almyra Deforest, [place] Follow up.   Specialties: Cardiology, Radiology Why: [facility] - your follow-up appointment will be on Monday Apr 20, 2020 11:15 AM (Arrive by 11:00 AM). Isaac Laud is one of the PAs that works closely with Dr. Claiborne Billings. Contact information: [address] [place] [address] [phone]        Troy Sine, MD. Call in 1 day(s).   Specialty: Cardiology Contact information: [address] [place] [address] [phone]              The patient has been discharged on:   1.Beta Blocker:  Yes [ yes  ]                              No   [   ]                              If No, reason:  2.Ace Inhibitor/ARB: Yes [   ]                                     No  [ no   ]                                     If No, reason: holding for now  3.Statin:   Yes [ yes  ]  No  [   ]                  If No, reason:  4.Ecasa:  Yes  [   ]                  No   [ no  ]                  If No, reason: coumadin    Signed: Elgie Collard 03/31/2020, 10:33 AM

## 2020-03-19 NOTE — Care Management Important Message (Signed)
Important Message  Patient Details  Name: Patrick Bell MRN: [id number] Date of Birth: 1940-11-24   Medicare Important Message Given:  Yes     Shelda Altes 03/19/2020, 10:31 AM

## 2020-03-19 NOTE — Progress Notes (Signed)
CARDIAC REHAB PHASE I   PRE:  Rate/Rhythm: 91 afib    BP: sitting 99/66    SaO2: 93 RA  MODE:  Ambulation: 140 ft   POST:  Rate/Rhythm: 143 afib    BP: sitting 132/66     SaO2: 93 RA  Pt feeling better today. Needs instructions on mechanics for getting out of bed and standing, somewhat related to anxiety. To BR then ambulated hall with RW, gait belt. Mod assist to stand, min assist x2 ambulating, f/u with rollator. Pt c/o weakness and diaphoresis and felt need to sit in hall. HR up to 140s walking, BP 124/54 sitting in hall.  Pt needs reminders to stand tall and tuck in hips while walking. VSS, to recliner. Practiced IS, progressing. Encouraged x2 more walks today. [phone]  Patrick Bell, ACSM 03/19/2020 9:57 AM

## 2020-03-19 NOTE — Progress Notes (Signed)
Physical Therapy Treatment Patient Details Name: Patrick Bell MRN: [id number] DOB: 24-Aug-1940 Today's Date: 03/19/2020    History of Present Illness Patient is a 79 y/o male who presents s/p MVP and repair of acute ascending thoracic aortic dissection 12/8. PMH includes HTN.    PT Comments    Pt supine in bed on arrival.  He was sleeping soundly.  He required assistance to move to edge of bed as he was attempting to use arms and reach/push outside "the tube."  Educated on sternal precautions.   Continue to recommend rehab in a post acute setting to address weakness and functional deficits.    Of note complaints of dizziness requiring seated rest break.  BP sitting after dizziness:119/95 Post session in supine: 131/87  Follow Up Recommendations  CIR;Supervision for mobility/OOB;Supervision/Assistance - 24 hour     Equipment Recommendations  Rolling walker with 5" wheels    Recommendations for Other Services       Precautions / Restrictions Precautions Precautions: Sternal;Fall Precaution Booklet Issued: No Precaution Comments: reviewed "move in the tube" and no pushing/pulling with UEs, watch BP with change in position- orthostatic hypotension. Restrictions Weight Bearing Restrictions: Yes (sternal precautions.) Other Position/Activity Restrictions: Sternal precautions    Mobility  Bed Mobility Overal bed mobility: Needs Assistance Bed Mobility: Rolling;Sidelying to Sit;Sit to Sidelying Rolling: Min assist Sidelying to sit: Min assist     Sit to sidelying: Min assist General bed mobility comments: Pt required cues for rolling to move to edge of bed and assistance for trunk control to rise into sitting.  Pt required lifting of legs back to bed against gravity, and use of bed pad to scoot to [facility].  Transfers Overall transfer level: Needs assistance Equipment used: Rolling walker (2 wheeled) Transfers: Sit to/from Stand Sit to Stand: Min assist         General  transfer comment: Cues for hand placement on B knees to push into standing.  Pt frequently attempting to reach outside "the tube"  Ambulation/Gait Ambulation/Gait assistance: Min assist Gait Distance (Feet): 40 Feet (x2) Assistive device: Rolling walker (2 wheeled) Gait Pattern/deviations: Decreased stride length;Trunk flexed     General Gait Details: Cues for upper trunk control and to step closes to RW.  Pt required seated rest break in chair due to complaints of dizziness.  BP 119/95.   Stairs             Wheelchair Mobility    Modified Rankin (Stroke Patients Only)       Balance Overall balance assessment: Needs assistance Sitting-balance support: Feet supported;No upper extremity supported Sitting balance-Leahy Scale: Fair Sitting balance - Comments: Supervision for safety, prefers to lean on RW or legs for support.   Standing balance support: Bilateral upper extremity supported Standing balance-Leahy Scale: Poor Standing balance comment: Requires UE support in standing.                            Cognition Arousal/Alertness: Awake/alert Behavior During Therapy: WFL for tasks assessed/performed Overall Cognitive Status: Within Functional Limits for tasks assessed                                 General Comments: appears [facility] for basic mobility tasks.      Exercises Other Exercises Other Exercises: IS x 10 reps quality 750 ml- 847ml    General Comments  Pertinent Vitals/Pain Pain Assessment: Faces Faces Pain Scale: Hurts even more Pain Location: low back Pain Descriptors / Indicators: Aching;Constant Pain Intervention(s): Monitored during session;Repositioned    Home Living                      Prior Function            PT Goals (current goals can now be found in the care plan section) Acute Rehab PT Goals Patient Stated Goal: I would like to have more energy PT Goal Formulation: With patient Potential  to Achieve Goals: Fair Progress towards PT goals: Progressing toward goals    Frequency    Min 3X/week      PT Plan Current plan remains appropriate    Co-evaluation              AM-PAC PT "6 Clicks" Mobility   Outcome Measure  Help needed turning from your back to your side while in a flat bed without using bedrails?: A Little Help needed moving from lying on your back to sitting on the side of a flat bed without using bedrails?: A Little Help needed moving to and from a bed to a chair (including a wheelchair)?: A Little Help needed standing up from a chair using your arms (e.g., wheelchair or bedside chair)?: A Little Help needed to walk in hospital room?: A Little Help needed climbing 3-5 steps with a railing? : A Little 6 Click Score: 18    End of Session Equipment Utilized During Treatment: Gait belt Activity Tolerance: Treatment limited secondary to medical complications (Comment) Patient left: with call bell/phone within reach;with bed alarm set;with nursing/sitter in room;in bed Nurse Communication: Mobility status (dizzy) PT Visit Diagnosis: Muscle weakness (generalized) (M62.81);Unsteadiness on feet (R26.81);Difficulty in walking, not elsewhere classified (R26.2);Dizziness and giddiness (R42)     Time: 8675-4492 PT Time Calculation (min) (ACUTE ONLY): 33 min  Charges:  $Gait Training: 8-22 mins $Therapeutic Activity: 8-22 mins                     Patrick Bell , PTA Acute Rehabilitation Services Pager [phone] Office [phone]     Kendall Justo Eli Hose 03/19/2020, 4:20 PM

## 2020-03-19 NOTE — Progress Notes (Signed)
Mobility Specialist - Progress Note   03/19/20 1407  Mobility  Activity Ambulated in hall  Level of Assistance Minimal assist, patient does 75% or more  Assistive Device Front wheel walker  Distance Ambulated (ft) 120 ft (60 ft x 2)  Mobility Response Tolerated fair  Mobility performed by Mobility specialist;Nurse tech  $Mobility charge 1 Mobility    Pre-mobility: 96 HR, 107/77 BP, 94% SpO2 During mobility: 110 HR, 98% SpO2 Post-mobility: 100 HR, 135/65 BP, 95% SpO2  Ambulated on RA. Pt min assist to stand from chair. He required one seated rest break due to fatigue. Pt then ambulated to bathroom and was unsuccessful in having a BM. Min assist to stand and then ambulated to his bed.    Pricilla Handler Mobility Specialist Mobility Specialist Phone: [phone]

## 2020-03-20 LAB — PROTIME-INR
INR: 2.7 — ABNORMAL HIGH (ref 0.8–1.2)
Prothrombin Time: 28.1 seconds — ABNORMAL HIGH (ref 11.4–15.2)

## 2020-03-20 LAB — BASIC METABOLIC PANEL
Anion gap: 10 (ref 5–15)
BUN: 33 mg/dL — ABNORMAL HIGH (ref 8–23)
CO2: 29 mmol/L (ref 22–32)
Calcium: 7.8 mg/dL — ABNORMAL LOW (ref 8.9–10.3)
Chloride: 98 mmol/L (ref 98–111)
Creatinine, Ser: 1.67 mg/dL — ABNORMAL HIGH (ref 0.61–1.24)
GFR, Estimated: 41 mL/min — ABNORMAL LOW (ref >60)
Glucose, Bld: 114 mg/dL — ABNORMAL HIGH (ref 70–99)
Potassium: 4 mmol/L (ref 3.5–5.1)
Sodium: 137 mmol/L (ref 135–145)

## 2020-03-20 MED ORDER — WARFARIN SODIUM 1 MG PO TABS
1.0000 mg | ORAL_TABLET | Freq: Every day | ORAL | Status: DC
  Administered 2020-03-22: 16:00:00 1 mg via ORAL
  Filled 2020-03-20: qty 1

## 2020-03-20 MED ORDER — SPIRONOLACTONE 25 MG PO TABS
25.0000 mg | ORAL_TABLET | Freq: Every day | ORAL | Status: DC
  Administered 2020-03-20: 09:00:00 25 mg via ORAL
  Filled 2020-03-20: qty 1

## 2020-03-20 MED FILL — Sodium Chloride IV Soln 0.9%: INTRAVENOUS | Qty: 2000 | Status: AC

## 2020-03-20 NOTE — Progress Notes (Signed)
Mobility Specialist - Progress Note   03/20/20 1533  Mobility  Activity Contraindicated/medical hold   Spoke w/ pt's RN. Will hold mobility today due to several hypotensive episodes today.   Pricilla Handler Mobility Specialist Mobility Specialist Phone: [phone]

## 2020-03-20 NOTE — Progress Notes (Signed)
      [address]       [place] [address]             [phone]       Performed a peer-to-peer this morning. The physician I spoke to is recommending subacute rehab, not acute rehab. We have submitted an expedited appeal. Dr. Roxy Manns was made aware. Hopefully we will know more Monday.   Nicholes Rough, PA-C

## 2020-03-20 NOTE — Progress Notes (Signed)
Pt on bedside commode feeling dizzy/lightheaded. BP low and called PA Jadene Pierini to make aware of low BP. Parameters added for metoprolol. Pt resting with call bell within reach.  Will continue to monitor. Payton Emerald, RN

## 2020-03-20 NOTE — Progress Notes (Signed)
CARDIAC REHAB PHASE I   PRE:  Rate/Rhythm: 100 afib    BP: lying 91/60    SaO2: 96 RA  MODE:  Ambulation: 80 ft, sat x3 for rest   POST:  Rate/Rhythm: max 145 afib, mostly 120s walking    BP: sitting 82/57     SaO2: 93 RA  Pt not feeling as well today, fatigued. Verbal cues for standing, to BR. Had to sit quickly coming out of BR. Walked hall with RW assist x1 with gait belt, f/u with rollator for seat. Had to sit x2 in hall due to fatigue, "no energy". On return to recliner BP 82/57. Encouraged PO intake, IS, walks. Wife present and supportive. Ed completed including IS, sternal precautions, walking, Vit K and CRPII. Receptive, will refer to [facility].  [phone]  Darrick Meigs CES, ACSM 03/20/2020 11:19 AM

## 2020-03-20 NOTE — Progress Notes (Signed)
      [address]       [place] [address]             [phone]      9 Days Post-Op Procedure(s) (LRB): TRANSESOPHAGEAL ECHOCARDIOGRAM (TEE) (N/A) REPAIR OF ACUTE ASCENDING THORACIC AORTIC DISSECTION USING A HEMASHIELD PLATINUM 26MM STRAIGHT GRAFT AND A HEASHIELD PLATINUM 28X10MM SINGLE ARM GRAFT (N/A) MITRAL VALVE REPAIR (MVR) USING MEMO 4D SIZE 32 RING (N/A) Subjective: Feels okay this morning. Wants to move to the chair.   Objective: Vital signs in last 24 hours: Temp:  [97.4 F (36.3 C)-98.7 F (37.1 C)] 98.5 F (36.9 C) (12/17 0347) Pulse Rate:  [90-104] 99 (12/17 0347) Cardiac Rhythm: Atrial fibrillation (12/16 1900) Resp:  [15-20] 20 (12/17 0347) BP: (100-131)/(53-87) 100/53 (12/17 0347) SpO2:  [92 %-95 %] 92 % (12/17 0347)     Intake/Output from previous day: 12/16 0701 - 12/17 0700 In: 530 [P.O.:520; I.V.:10] Out: 650 [Urine:650] Intake/Output this shift: No intake/output data recorded.  General appearance: alert, cooperative and no distress Heart: irregularly irregular rhythm Lungs: clear to auscultation bilaterally Abdomen: soft, non-tender; bowel sounds normal; no masses,  no organomegaly Extremities: extremities normal, atraumatic, no cyanosis or edema and left hand with edema Wound: clean and dry  Lab Results: No results for input(s): WBC, HGB, HCT, PLT in the last 72 hours. BMET: Recent Labs    03/19/20 0445 03/20/20 0435  NA 139 137  K 3.7 4.0  CL 99 98  CO2 29 29  GLUCOSE 108* 114*  BUN 33* 33*  CREATININE 1.41* 1.67*  CALCIUM 7.9* 7.8*    PT/INR:  Recent Labs    03/20/20 0435  LABPROT 28.1*  INR 2.7*   ABG    Component Value Date/Time   PHART 7.373 03/12/2020 1034   HCO3 26.1 03/12/2020 1034   TCO2 23 03/12/2020 1658   ACIDBASEDEF 2.0 03/12/2020 0909   O2SAT 68.9 03/15/2020 0455   CBG (last 3)  No results for input(s): GLUCAP in the last 72 hours.  Assessment/Plan: S/P Procedure(s)  (LRB): TRANSESOPHAGEAL ECHOCARDIOGRAM (TEE) (N/A) REPAIR OF ACUTE ASCENDING THORACIC AORTIC DISSECTION USING A HEMASHIELD PLATINUM 26MM STRAIGHT GRAFT AND A HEASHIELD PLATINUM 28X10MM SINGLE ARM GRAFT (N/A) MITRAL VALVE REPAIR (MVR) USING MEMO 4D SIZE 32 RING (N/A)   1. CV-NSR in the 80s-90s, BP well controlled but too low at times to start cozaar.. Continue Amio, coumadin, low-dose metoprolol, and  no statin. 2. Pulm-tolerating room air with good oxygenation, CXR yesterday showed increasing bibasilar atelectasis.  3. Renal-creatinine 1.67 ,potassium 4.0. Continue lasix 40mg  IV BID. Remains over baseline wt by about 10kg 4. H and H 8.9/26.9, expected acute blood loss anemia 5. Endo- blood glucose well controlled. 6. Pain- continue tramadol 7. On coumadin 1mg , INR 2.7  Plan: We will do a CTA when he follows up with [place] in 1 month. Hopefully transfer to CIR. Continue working with PT/OT for mobility and strength.       LOS: 9 days    Patrick Bell 03/20/2020

## 2020-03-20 NOTE — Progress Notes (Signed)
Inpatient Rehab Admissions Coordinator:   Received initial denial from [facility] this morning.  Nicholes Rough, PA-C, completed peer to peer review and denial was upheld.  I will start expedited appeal process today.   Shann Medal, PT, DPT Admissions Coordinator [phone] 03/20/20  11:09 AM

## 2020-03-21 ENCOUNTER — Inpatient Hospital Stay (HOSPITAL_COMMUNITY): Payer: Medicare Other

## 2020-03-21 DIAGNOSIS — Z9889 Other specified postprocedural states: Secondary | ICD-10-CM

## 2020-03-21 DIAGNOSIS — I959 Hypotension, unspecified: Secondary | ICD-10-CM

## 2020-03-21 DIAGNOSIS — I9589 Other hypotension: Secondary | ICD-10-CM

## 2020-03-21 DIAGNOSIS — I4891 Unspecified atrial fibrillation: Secondary | ICD-10-CM

## 2020-03-21 LAB — BASIC METABOLIC PANEL
Anion gap: 11 (ref 5–15)
BUN: 37 mg/dL — ABNORMAL HIGH (ref 8–23)
CO2: 25 mmol/L (ref 22–32)
Calcium: 8 mg/dL — ABNORMAL LOW (ref 8.9–10.3)
Chloride: 97 mmol/L — ABNORMAL LOW (ref 98–111)
Creatinine, Ser: 1.64 mg/dL — ABNORMAL HIGH (ref 0.61–1.24)
GFR, Estimated: 42 mL/min — ABNORMAL LOW (ref >60)
Glucose, Bld: 113 mg/dL — ABNORMAL HIGH (ref 70–99)
Potassium: 4.1 mmol/L (ref 3.5–5.1)
Sodium: 133 mmol/L — ABNORMAL LOW (ref 135–145)

## 2020-03-21 LAB — CBC
HCT: 24.9 % — ABNORMAL LOW (ref 39.0–52.0)
Hemoglobin: 8 g/dL — ABNORMAL LOW (ref 13.0–17.0)
MCH: 28.8 pg (ref 26.0–34.0)
MCHC: 32.1 g/dL (ref 30.0–36.0)
MCV: 89.6 fL (ref 80.0–100.0)
Platelets: 293 10*3/uL (ref 150–400)
RBC: 2.78 MIL/uL — ABNORMAL LOW (ref 4.22–5.81)
RDW: 14.4 % (ref 11.5–15.5)
WBC: 16.2 10*3/uL — ABNORMAL HIGH (ref 4.0–10.5)
nRBC: 0 % (ref 0.0–0.2)

## 2020-03-21 LAB — ECHOCARDIOGRAM LIMITED
Area-P 1/2: 3.7 cm2
Calc EF: 41.5 %
Height: 70 in
MV VTI: 1.33 cm2
S' Lateral: 3.4 cm
Single Plane A2C EF: 49.1 %
Single Plane A4C EF: 29.1 %
Weight: 3287.5 oz

## 2020-03-21 LAB — LACTIC ACID, PLASMA
Lactic Acid, Venous: 1.1 mmol/L (ref 0.5–1.9)
Lactic Acid, Venous: 1.3 mmol/L (ref 0.5–1.9)

## 2020-03-21 LAB — ALBUMIN: Albumin: 2.1 g/dL — ABNORMAL LOW (ref 3.5–5.0)

## 2020-03-21 LAB — PROTIME-INR
INR: 2.6 — ABNORMAL HIGH (ref 0.8–1.2)
Prothrombin Time: 27 seconds — ABNORMAL HIGH (ref 11.4–15.2)

## 2020-03-21 LAB — MAGNESIUM: Magnesium: 1.9 mg/dL (ref 1.7–2.4)

## 2020-03-21 MED ORDER — AMIODARONE LOAD VIA INFUSION: 150.0000 mg | Freq: Once | INTRAVENOUS | Status: DC

## 2020-03-21 MED ORDER — MIDODRINE HCL 5 MG PO TABS
2.5000 mg | ORAL_TABLET | Freq: Two times a day (BID) | ORAL | Status: DC
  Administered 2020-03-21 – 2020-03-24 (×3): 2.5 mg via ORAL
  Filled 2020-03-21 (×3): qty 1

## 2020-03-21 MED ORDER — CHLORHEXIDINE GLUCONATE CLOTH 2 % EX PADS
6.0000 | MEDICATED_PAD | Freq: Once | CUTANEOUS | Status: AC
  Administered 2020-03-22: 01:00:00 6 via TOPICAL

## 2020-03-21 MED ORDER — DIGOXIN 125 MCG PO TABS
0.1250 mg | ORAL_TABLET | Freq: Every day | ORAL | Status: DC
  Administered 2020-03-21 – 2020-03-26 (×5): 0.125 mg via ORAL
  Filled 2020-03-21 (×5): qty 1

## 2020-03-21 MED ORDER — AMIODARONE HCL IN DEXTROSE 360-4.14 MG/200ML-% IV SOLN
60.0000 mg/h | INTRAVENOUS | Status: DC
  Administered 2020-03-21: 14:00:00 60 mg/h via INTRAVENOUS

## 2020-03-21 MED ORDER — AMIODARONE HCL IN DEXTROSE 360-4.14 MG/200ML-% IV SOLN
30.0000 mg/h | INTRAVENOUS | Status: DC
  Administered 2020-03-21 – 2020-03-23 (×3): 30 mg/h via INTRAVENOUS
  Filled 2020-03-21 (×5): qty 200

## 2020-03-21 MED ORDER — CHLORHEXIDINE GLUCONATE 0.12 % MT SOLN
15.0000 mL | Freq: Once | OROMUCOSAL | Status: AC
  Administered 2020-03-22: 05:00:00 15 mL via OROMUCOSAL

## 2020-03-21 NOTE — Progress Notes (Signed)
Patient ID: Patrick Bell, male   DOB: 1940/07/17, 79 y.o.   MRN: [id number] EVENING ROUNDS NOTE :     [address]       [place] [address]             [phone]                 10 Days Post-Op Procedure(s) (LRB): TRANSESOPHAGEAL ECHOCARDIOGRAM (TEE) (N/A) REPAIR OF ACUTE ASCENDING THORACIC AORTIC DISSECTION USING A HEMASHIELD PLATINUM 26MM STRAIGHT GRAFT AND A HEASHIELD PLATINUM 28X10MM SINGLE ARM GRAFT (N/A) MITRAL VALVE REPAIR (MVR) USING MEMO 4D SIZE 32 RING (N/A)  Total Length of Stay:  LOS: 10 days  BP 109/79 (BP Location: Right Arm)   Pulse 96   Temp 97.9 F (36.6 C) (Oral)   Resp 20   Ht 5\' 10"  (1.778 m)   Wt 93.2 kg   SpO2 99%   BMI 29.48 kg/m   .Intake/Output      12/17 0701 12/18 0700 12/18 0701 12/19 0700   P.O. 120 240   I.V. (mL/kg)     Total Intake(mL/kg) 120 (1.3) 240 (2.6)   Urine (mL/kg/hr) 100 (0) 100 (0.1)   Stool  0   Total Output 100 100   Net +20 +140        Urine Occurrence 2 x 1 x   Stool Occurrence  1 x     . amiodarone 60 mg/hr (03/21/20 1351)   Followed by  . amiodarone 30 mg/hr (03/21/20 1843)     Lab Results  Component Value Date   WBC 16.2 (H) 03/21/2020   HGB 8.0 (L) 03/21/2020   HCT 24.9 (L) 03/21/2020   PLT 293 03/21/2020   GLUCOSE 113 (H) 03/21/2020   CHOL 184 11/14/2019   TRIG 87 11/14/2019   HDL 70 11/14/2019   LDLCALC 98 11/14/2019   ALT 24 03/16/2020   AST 30 03/16/2020   NA 133 (L) 03/21/2020   K 4.1 03/21/2020   CL 97 (L) 03/21/2020   CREATININE 1.64 (H) 03/21/2020   BUN 37 (H) 03/21/2020   CO2 25 03/21/2020   TSH 4.060 03/12/2018   PSA 2.4 07/29/2014   INR 2.6 (H) 03/21/2020   HGBA1C 5.4 03/09/2020   Patient up in chair, still feels weak, standing atrial fibrillation, blood pressure 117/80  I reviewed the echocardiogram findings with Dr. Almyra Brace has a moderately large posterior effusion on Coumadin-this is at least partially contributing to his weak blood pressure,  generalized weakness and shortness of breath.  I discussed with he and his wife that the safest approach would be to proceed with a subxiphoid pericardial drainage of the fluid to avoid potential for developing major tamponade symptoms.  Patient and his wife are agreeable with this.  We will plan for first thing in the morning   Grace Isaac MD  Beeper [phone] Office [phone] 03/21/2020 6:50 PM

## 2020-03-21 NOTE — Progress Notes (Signed)
Mobility Specialist - Progress Note   03/21/20 1154  Mobility  Activity Refused mobility   Pt states he would like to hold mobility until later today as he is pending further work-up. Will f/u as able.   Pricilla Handler Mobility Specialist Mobility Specialist Phone: [phone]

## 2020-03-21 NOTE — Progress Notes (Signed)
  Amiodarone Drug - Drug Interaction Consult Note  Recommendations: Monitor INR closely, likely to increase Digoxin is being started at a low dose, Monitor HR  Amiodarone is metabolized by the cytochrome P450 system and therefore has the potential to cause many drug interactions. Amiodarone has an average plasma half-life of 50 days (range 20 to 100 days).   There is potential for drug interactions to occur several weeks or months after stopping treatment and the onset of drug interactions may be slow after initiating amiodarone.   []  Statins: Increased risk of myopathy. Simvastatin- restrict dose to 20mg  daily. Other statins: counsel patients to report any muscle pain or weakness immediately.  [x]  Anticoagulants: Amiodarone can increase anticoagulant effect. Consider warfarin dose reduction. Patients should be monitored closely and the dose of anticoagulant altered accordingly, remembering that amiodarone levels take several weeks to stabilize.  []  Antiepileptics: Amiodarone can increase plasma concentration of phenytoin, the dose should be reduced. Note that small changes in phenytoin dose can result in large changes in levels. Monitor patient and counsel on signs of toxicity.  []  Beta blockers: increased risk of bradycardia, AV block and myocardial depression. Sotalol - avoid concomitant use.  []   Calcium channel blockers (diltiazem and verapamil): increased risk of bradycardia, AV block and myocardial depression.  []   Cyclosporine: Amiodarone increases levels of cyclosporine. Reduced dose of cyclosporine is recommended.  [x]  Digoxin dose should be halved when amiodarone is started.  []  Diuretics: increased risk of cardiotoxicity if hypokalemia occurs.  []  Oral hypoglycemic agents (glyburide, glipizide, glimepiride): increased risk of hypoglycemia. Patient's glucose levels should be monitored closely when initiating amiodarone therapy.   []  Drugs that prolong the QT interval:   Torsades de pointes risk may be increased with concurrent use - avoid if possible.  Monitor QTc, also keep magnesium/potassium WNL if concurrent therapy can't be avoided. Marland Kitchen Antibiotics: e.g. fluoroquinolones, erythromycin. . Antiarrhythmics: e.g. quinidine, procainamide, disopyramide, sotalol. . Antipsychotics: e.g. phenothiazines, haloperidol.  . Lithium, tricyclic antidepressants, and methadone.  Thank You,  Cala Bradford  03/21/2020 12:12 PM

## 2020-03-21 NOTE — Consult Note (Signed)
Cardiology Consultation:  Patient ID: MARCELLUS PULLIAM MRN: [id number]; DOB: 07-06-1940  Admit date: 03/11/2020 Date of Consult: 03/21/2020  Primary Care Provider: Dettinger, Fransisca Kaufmann, MD Primary Cardiologist: Shelva Majestic, MD  Primary Electrophysiologist:  None  Patient Profile:  AADITH RAUDENBUSH is a 79 y.o. male with a hx of hypertension, hyperlipidemia, asthma, severe mitral vegetation status post mitral valve repair who is being seen today for the evaluation of atrial fibrillation at the request of Darylene Price, MD.  History of Present Illness:  Mr. Misko was admitted on 03/11/2020 for elective mitral valve repair.  He underwent repair and it appears had an acute type a aortic dissection during the procedure.  He also underwent ascending aortic repair with resuspension of the native aortic valve and coronary reimplantation.  He apparently had paroxysmal atrial fibrillation between 12/8 and 03/16/2020.  He was in the cardiac ICU most of this time.  Since 03/16/2020 has been persistently in atrial fibrillation.  His course has been complicated by what appears to be failure to thrive and hypotension.  He is also suffered an AKI which is not improved.  Creatinine today 1.64.  Prior to surgery creatinine was 0.60.  He reports no energy.  Blood pressures are quite low.  No fever or cough reported.  Crackles noted left lung base.  I did review his heart rates.  They range in the 90s to up to 140 bpm with exertion.  Has had no fevers.  No recent CBC.  He does report some pitting edema.  His wife was in the room during my examination.  Very poor p.o. intake.  He is not eating.  He just reports he has no energy.  INR therapeutic.  Heart Pathway Score:       Past Medical History: Past Medical History:  Diagnosis Date  . Acute thoracic aortic dissection (HCC) 03/11/2020   Type A  . Allergy   . Asthma   . Cataract   . Cataract   . Diverticulitis   . Dyspnea   . GERD (gastroesophageal reflux  disease)   . Heart murmur 12/2019  . Hyperlipidemia   . Hypertension   . Inguinal hernia bilateral, non-recurrent   . S/P aortic dissection repair 03/11/2020   supracoronary straight graft repair with resuspension of native aortic valve and open distal anastomosis for intraoperative acute type A aortic dissection   . S/P mitral valve repair 03/11/2020   Complex valvuloplasty including artificial Gore-tex neochord placement x 4, suture plication of posterior leaflet and posteromedial commissure and 32 mm Sorin Memo 4D ring annuloplasty  . Sleep apnea     Past Surgical History: Past Surgical History:  Procedure Laterality Date  . bilateral inguinal hernia    . CARDIAC CATHETERIZATION Bilateral 02/21/2020  . COLONOSCOPY  06/09/2004   HDQ:QIWLNLG colon diverticulosis, otherwise normal colonoscopy  . EYE SURGERY  08/2014  . HERNIA REPAIR    . MITRAL VALVE REPAIR N/A 03/11/2020   Procedure: MITRAL VALVE REPAIR (MVR) USING MEMO 4D SIZE 32 RING;  Surgeon: Rexene Alberts, MD;  Location: [place];  Service: Open Heart Surgery;  Laterality: N/A;  . PALATE / UVULA BIOPSY / EXCISION    . REPAIR OF ACUTE ASCENDING THORACIC AORTIC DISSECTION N/A 03/11/2020   Procedure: REPAIR OF ACUTE ASCENDING THORACIC AORTIC DISSECTION USING A HEMASHIELD PLATINUM 26MM STRAIGHT GRAFT AND A HEASHIELD PLATINUM 28X10MM SINGLE ARM GRAFT;  Surgeon: Rexene Alberts, MD;  Location: [place];  Service: Open Heart Surgery;  Laterality: N/A;  .  RIGHT/LEFT HEART CATH AND CORONARY ANGIOGRAPHY N/A 02/21/2020   Procedure: RIGHT/LEFT HEART CATH AND CORONARY ANGIOGRAPHY;  Surgeon: Troy Sine, MD;  Location: [place] [facility];  Service: Cardiovascular;  Laterality: N/A;  . TEE WITHOUT CARDIOVERSION N/A 12/19/2019   Procedure: TRANSESOPHAGEAL ECHOCARDIOGRAM (TEE);  Surgeon: Buford Dresser, MD;  Location: [facility] ENDOSCOPY;  Service: Cardiovascular;  Laterality: N/A;  . TEE WITHOUT CARDIOVERSION N/A 03/11/2020   Procedure:  TRANSESOPHAGEAL ECHOCARDIOGRAM (TEE);  Surgeon: Rexene Alberts, MD;  Location: [place];  Service: Open Heart Surgery;  Laterality: N/A;  . TONSILLECTOMY    . VASECTOMY       Home Medications:  Prior to Admission medications   Medication Sig Start Date End Date Taking? Authorizing Provider  acetaminophen (TYLENOL) 500 MG tablet Take 500 mg by mouth daily as needed (pain.).   Yes [provider]  fexofenadine-pseudoephedrine (ALLEGRA-D) 60-120 MG 12 hr tablet Take 1 tablet by mouth daily as needed (allergies).    Yes [provider]  hydroxypropyl methylcellulose / hypromellose (ISOPTO TEARS / GONIOVISC) 2.5 % ophthalmic solution Place 1-2 drops into both eyes 3 (three) times daily as needed (dry/irritated eyes.).   Yes [provider]  losartan (COZAAR) 50 MG tablet Take 1 tablet (50 mg total) by mouth daily. Patient taking differently: Take 50 mg by mouth every evening.  01/07/20 04/06/20 Yes Meng, Isaac Laud, PA  montelukast (SINGULAIR) 10 MG tablet Take 1 tablet (10 mg total) daily by mouth. Patient taking differently: Take 10 mg by mouth at bedtime.  11/14/19  Yes Dettinger, Fransisca Kaufmann, MD  Olopatadine HCl (PATADAY) 0.2 % SOLN Place 1 drop into both eyes daily as needed (allergies).    Yes [provider]  pantoprazole (PROTONIX) 40 MG tablet Take 1 tablet (40 mg total) daily by mouth. Patient taking differently: Take 40 mg by mouth at bedtime.  05/17/19  Yes Dettinger, Fransisca Kaufmann, MD  PROAIR HFA 108 [phone] Base) MCG/ACT inhaler Inhale 2 puffs into the lungs every 6 (six) hours as needed for wheezing. Patient taking differently: Inhale 2 puffs into the lungs every 6 (six) hours as needed for wheezing.  09/13/17  Yes Dettinger, Fransisca Kaufmann, MD  simvastatin (ZOCOR) 10 MG tablet Take 1 tablet (10 mg total) by mouth at bedtime. 11/14/19  Yes Dettinger, Fransisca Kaufmann, MD  benzonatate (TESSALON) 200 MG capsule Take 1 capsule (200 mg total) by mouth 3 (three) times daily as needed for  cough. Patient not taking: Reported on 03/09/2020 03/06/20   Claretta Fraise, MD    Inpatient Medications: Scheduled Meds: . amiodarone  400 mg Oral BID  . bisacodyl  10 mg Oral Daily   Or  . bisacodyl  10 mg Rectal Daily  . Chlorhexidine Gluconate Cloth  6 each Topical Daily  . digoxin  0.125 mg Oral Daily  . docusate sodium  200 mg Oral Daily  . ferrous [id number] C-folic acid  1 capsule Oral Q breakfast  . mouth rinse  15 mL Mouth Rinse BID  . metoprolol tartrate  12.5 mg Oral BID  . montelukast  10 mg Oral QHS  . pantoprazole  40 mg Oral QHS  . simvastatin  10 mg Oral QHS  . warfarin  1 mg Oral q1600  . Warfarin - Physician Dosing Inpatient   Does not apply q1600   Continuous Infusions:  PRN Meds: benzonatate, metoprolol tartrate, ondansetron (ZOFRAN) IV, oxyCODONE, traMADol  Allergies:    No Known Allergies  Social History:   Social History  Socioeconomic History  . Marital status: Married    Spouse name: Wells Guiles  . Number of children: 2  . Years of education: 80  . Highest education level: Some college, no degree  Occupational History  . 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 Smoking status: Former Smoker    Packs/day: 4.00    Years: 25.00    Pack years: 100.00    Types: Cigarettes    Quit date: 06/23/1982    Years since quitting: 37.7  . Smokeless tobacco: Never Used  . 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 Vaping Use: Never used  Substance and Sexual Activity  . Alcohol use: Yes    Alcohol/week: 5.0 standard drinks    Types: 5 Shots of liquor per week    Comment: brandy a few times a month, beer once a month  . Drug use: No  . Sexual activity: Yes    Birth control/protection: Surgical  Other Topics Concern  . Not on file  Social History Narrative  . Not on file   Social Determinants of Health   Financial Resource Strain: Not on file  Food Insecurity: Not on file   Transportation Needs: Not on file  Physical Activity: Not on file  Stress: Not on file  Social Connections: Not on file  Intimate Partner Violence: Not on file     Family History:    Family History  Problem Relation Age of Onset  . Heart disease Mother   . Hip fracture Mother 64  . Osteoporosis Mother   . Dementia Mother 54  . Arthritis Father   . Heart disease Father   . Arthritis Sister   . Arthritis Brother   . Colon cancer Neg Hx      ROS:  All other ROS reviewed and negative. Pertinent positives noted in the HPI.     Physical Exam/Data:   Vitals:   03/20/20 2329 03/21/20 0343 03/21/20 0700 03/21/20 0842  BP: (!) 91/58 106/73 102/63 111/72  Pulse: 100 84 86 99  Resp: 19 19 14    Temp:  98.5 F (36.9 C) 97.6 F (36.4 C)   TempSrc: Oral Oral Oral   SpO2: 95% 92% 97%   Weight:  93.2 kg    Height:         Intake/Output Summary (Last 24 hours) at 03/21/2020 1147 Last data filed at 03/21/2020 0900 Gross per 24 hour  Intake --  Output 200 ml  Net -200 ml    Last 3 Weights 03/21/2020 03/18/2020 03/17/2020  Weight (lbs) 205 lb 7.5 oz 210 lb 5.1 oz 204 lb 5.9 oz  Weight (kg) 93.2 kg 95.4 kg 92.7 kg    Body mass index is 29.48 kg/m.  General: Well nourished, well developed, in no acute distress Head: Atraumatic, normal size  Eyes: PEERLA, EOMI  Neck: Supple, JVD 7 to 8 cm of water Endocrine: No thryomegaly Cardiac: Normal S1, S2; irregular rhythm, distant heart sounds Lungs: Diminished lung sounds, crackles at the left lung base Abd: Soft, nontender, no hepatomegaly  Ext: 1+ edema Musculoskeletal: No deformities, BUE and BLE strength normal and equal Skin: Warm and dry, no rashes   Neuro: Alert and oriented to person, place, time, and situation, CNII-XII grossly intact, no focal deficits  Psych: Normal mood and affect   EKG:  The EKG was personally reviewed and demonstrates: Atrial fibrillation heart rate 106, no acute ischemic changes  Telemetry:   Telemetry was personally reviewed and demonstrates: Atrial fibrillation with heart rates in the 90 to 120 bpm range  Relevant CV Studies:  Left heart cath 02/21/2020 -Normal coronary arteries  Laboratory Data: High Sensitivity Troponin:  No results for input(s): TROPONINIHS in the last 720 hours.   Cardiac EnzymesNo results for input(s): TROPONINI in the last 168 hours. No results for input(s): TROPIPOC in the last 168 hours.  Chemistry Recent Labs  Lab 03/19/20 0445 03/20/20 0435 03/21/20 0414  NA 139 137 133*  K 3.7 4.0 4.1  CL 99 98 97*  CO2 29 29 25   GLUCOSE 108* 114* 113*  BUN 33* 33* 37*  CREATININE 1.41* 1.67* 1.64*  CALCIUM 7.9* 7.8* 8.0*  GFRNONAA 50* 41* 42*  ANIONGAP 11 10 11     Recent Labs  Lab 03/14/20 1534 03/15/20 0455 03/16/20 0536  PROT 4.8* 5.0* 4.9*  ALBUMIN 2.3* 2.2* 2.2*  AST 39 31 30  ALT 30 28 24   ALKPHOS 41 44 61  BILITOT 0.8 0.8 1.0   Hematology Recent Labs  Lab 03/15/20 0455 03/16/20 0536 03/17/20 0350  WBC 12.7* 11.3* 10.6*  RBC 3.02* 2.97* 2.99*  HGB 9.1* 8.9* 8.9*  HCT 27.8* 26.9* 26.9*  MCV 92.1 90.6 90.0  MCH 30.1 30.0 29.8  MCHC 32.7 33.1 33.1  RDW 15.7* 15.0 14.8  PLT 116* 145* 180   BNPNo results for input(s): BNP, PROBNP in the last 168 hours.  DDimer No results for input(s): DDIMER in the last 168 hours.  Radiology/Studies:  DG Chest 1 View  Result Date: 03/19/2020 CLINICAL DATA:  Shortness of breath and recent heart surgery EXAM: CHEST  1 VIEW COMPARISON:  03/16/2020 FINDINGS: Cardiac shadow is enlarged but stable. Postsurgical changes are again noted. Left-sided PICC line is noted extending into the right atrium. Mild bibasilar atelectasis is noted slightly increased from the prior study. No pneumothorax is seen. IMPRESSION: Increasing bibasilar atelectasis. Electronically Signed   By: Inez Catalina M.D.   On: 03/19/2020 08:28    Assessment and Plan:  1. Postop Afib -Mitral valve surgery 03/11/2020.  Also suffered  type a aortic dissection during cannulation.  Status post repair of both. -He has received roughly 8 g of amiodarone.  He was in and out of A. fib between 12/8-12/13 and now has been persistently in A. fib since 03/16/2020. -He has been persistently hypotensive and also suffered an AKI.  He reports no energy and is fatigued. -We will recheck a CBC today.  Also will add on albumin for today.  I would like to repeat a limited echocardiogram just to make sure there is no smoldering pericardial effusion to explain his hypotension and possibly persistent A. Fib. -he appears volume up. Hold diuresis until TTE.  -We will also check thyroid studies.  No values in the last few years. -Due to hypotension I added digoxin 0.125 mg daily.  We will be cautious of this given his renal function. -He is on Coumadin with a therapeutic INR. -Has been in A. fib greater than 48 hours.  If he does not convert back to normal rhythm we should consider a TEE/cardioversion.  I would like to elucidate any reversible cause of his hypotension as this may be driving his A. fib.  This could all just be vasoplegia in the setting of recent cardiac surgery. -I also added back his midodrine 2.5 mg twice daily to help with blood pressure.  For questions or updates, please contact [place] Please consult [email or web address]  for contact info under   Signed, [place] B[place] T. Audie Box, [place]  03/21/2020 11:47 AM

## 2020-03-21 NOTE — Progress Notes (Addendum)
[address]       [employer] [id number]             [phone]      10 Days Post-Op Procedure(s) (LRB): TRANSESOPHAGEAL ECHOCARDIOGRAM (TEE) (N/A) REPAIR OF ACUTE ASCENDING THORACIC AORTIC DISSECTION USING A HEMASHIELD PLATINUM 26MM STRAIGHT GRAFT AND A HEASHIELD PLATINUM 28X10MM SINGLE ARM GRAFT (N/A) MITRAL VALVE REPAIR (MVR) USING MEMO 4D SIZE 32 RING (N/A) Subjective: Feels fair at best  Objective: Vital signs in last 24 hours: Temp:  [97.5 F (36.4 C)-98.5 F (36.9 C)] 98.5 F (36.9 C) (12/18 0343) Pulse Rate:  [84-104] 84 (12/18 0343) Cardiac Rhythm: Atrial fibrillation (12/17 1900) Resp:  [18-24] 19 (12/18 0343) BP: (86-114)/(53-75) 106/73 (12/18 0343) SpO2:  [92 %-98 %] 92 % (12/18 0343) Weight:  [93.2 kg] 93.2 kg (12/18 0343)  Hemodynamic parameters for last 24 hours:    Intake/Output from previous day: 12/17 0701 - 12/18 0700 In: 120 [P.O.:120] Out: 100 [Urine:100] Intake/Output this shift: No intake/output data recorded.  General appearance: alert, cooperative, fatigued and no distress Heart: irregularly irregular rhythm Lungs: slightly dim in right base, o/w clear Abdomen: benign Extremities: less edema Wound: incis healing well  Lab Results: No results for input(s): WBC, HGB, HCT, PLT in the last 72 hours. BMET: Recent Labs    03/20/20 0435 03/21/20 0414  NA 137 133*  K 4.0 4.1  CL 98 97*  CO2 29 25  GLUCOSE 114* 113*  BUN 33* 37*  CREATININE 1.67* 1.64*  CALCIUM 7.8* 8.0*    PT/INR:  Recent Labs    03/21/20 0414  LABPROT 27.0*  INR 2.6*   ABG    Component Value Date/Time   PHART 7.373 03/12/2020 1034   HCO3 26.1 03/12/2020 1034   TCO2 23 03/12/2020 1658   ACIDBASEDEF 2.0 03/12/2020 0909   O2SAT 68.9 03/15/2020 0455   CBG (last 3)  No results for input(s): GLUCAP in the last 72 hours.  Meds Scheduled Meds: . amiodarone  400 mg Oral BID  . bisacodyl  10 mg Oral Daily   Or  . bisacodyl  10 mg Rectal  Daily  . Chlorhexidine Gluconate Cloth  6 each Topical Daily  . docusate sodium  200 mg Oral Daily  . ferrous [id number] C-folic acid  1 capsule Oral Q breakfast  . furosemide  40 mg Oral Daily  . mouth rinse  15 mL Mouth Rinse BID  . metoprolol tartrate  12.5 mg Oral BID  . montelukast  10 mg Oral QHS  . pantoprazole  40 mg Oral QHS  . potassium chloride  40 mEq Oral Daily  . simvastatin  10 mg Oral QHS  . spironolactone  25 mg Oral Daily  . warfarin  1 mg Oral q1600  . Warfarin - Physician Dosing Inpatient   Does not apply q1600   Continuous Infusions: PRN Meds:.benzonatate, metoprolol tartrate, ondansetron (ZOFRAN) IV, oxyCODONE, traMADol  Xrays DG Chest 1 View  Result Date: 03/19/2020 CLINICAL DATA:  Shortness of breath and recent heart surgery EXAM: CHEST  1 VIEW COMPARISON:  03/16/2020 FINDINGS: Cardiac shadow is enlarged but stable. Postsurgical changes are again noted. Left-sided PICC line is noted extending into the right atrium. Mild bibasilar atelectasis is noted slightly increased from the prior study. No pneumothorax is seen. IMPRESSION: Increasing bibasilar atelectasis. Electronically Signed   By: Inez Catalina M.D.   On: 03/19/2020 08:28    Assessment/Plan: S/P Procedure(s) (LRB): TRANSESOPHAGEAL ECHOCARDIOGRAM (TEE) (N/A) REPAIR OF  ACUTE ASCENDING THORACIC AORTIC DISSECTION USING A HEMASHIELD PLATINUM 26MM STRAIGHT GRAFT AND A HEASHIELD PLATINUM 28X10MM SINGLE ARM GRAFT (N/A) MITRAL VALVE REPAIR (MVR) USING MEMO 4D SIZE 32 RING (N/A) 1 afeb, VSS with low BP at times-  orthostasis 2 afib with CVR mostly, some increase with activity 3 sats good on RA- conts to be congested- add flutter valve 4 INR 2.6 - cont current coumadin dosing 5 creat 1.64 today,BUN a little higher,  lasix reduced yesterday, on spiro also , weight slowly trending down. Albumin is low, he appears to maybe be intravasc dry with orthostasis - stop diuretics for now. Recheck renal fxn  tomorrow 6 slow improvement with rehab with weakness and fatigue  LOS: 10 days    Patrick Giovanni PA-C Pager [phone] 03/21/2020  Still in afib , on coumadin  Cr decreased from peak but remains about 1.6 Discussed with cardiology- will get limited echo ro peri effusion and  consider cardioversion if remains in afib- likely would be ideal if was in sinus I have seen and examined Patrick Bell and agree with the above assessment  and plan.  Grace Isaac MD Beeper [phone] Office [phone] 03/21/2020 1:16 PM

## 2020-03-21 NOTE — Progress Notes (Signed)
  Echocardiogram 2D Echocardiogram has been performed.  Bobbye Charleston 03/21/2020, 3:31 PM

## 2020-03-22 ENCOUNTER — Inpatient Hospital Stay (HOSPITAL_COMMUNITY): Payer: Medicare Other | Admitting: Anesthesiology

## 2020-03-22 ENCOUNTER — Other Ambulatory Visit (HOSPITAL_COMMUNITY): Payer: Medicare Other

## 2020-03-22 ENCOUNTER — Inpatient Hospital Stay (HOSPITAL_COMMUNITY): Payer: Medicare Other

## 2020-03-22 ENCOUNTER — Encounter (HOSPITAL_COMMUNITY)
Admission: RE | Disposition: A | Discharge status: Rehabilitation Facility | Payer: Self-pay | Source: Home / Self Care | Attending: Thoracic Surgery (Cardiothoracic Vascular Surgery)

## 2020-03-22 DIAGNOSIS — I313 Pericardial effusion (noninflammatory): Secondary | ICD-10-CM

## 2020-03-22 HISTORY — PX: TEE WITHOUT CARDIOVERSION: SHX5443

## 2020-03-22 HISTORY — PX: CHEST TUBE INSERTION: SHX231

## 2020-03-22 HISTORY — PX: PERICARDIAL WINDOW: SHX2213

## 2020-03-22 LAB — TSH: TSH: 6.518 u[IU]/mL — ABNORMAL HIGH (ref 0.350–4.500)

## 2020-03-22 LAB — CBC
HCT: 24.6 % — ABNORMAL LOW (ref 39.0–52.0)
Hemoglobin: 7.9 g/dL — ABNORMAL LOW (ref 13.0–17.0)
MCH: 28.6 pg (ref 26.0–34.0)
MCHC: 32.1 g/dL (ref 30.0–36.0)
MCV: 89.1 fL (ref 80.0–100.0)
Platelets: 281 10*3/uL (ref 150–400)
RBC: 2.76 MIL/uL — ABNORMAL LOW (ref 4.22–5.81)
RDW: 14.6 % (ref 11.5–15.5)
WBC: 15.7 10*3/uL — ABNORMAL HIGH (ref 4.0–10.5)
nRBC: 0 % (ref 0.0–0.2)

## 2020-03-22 LAB — ECHO INTRAOPERATIVE TEE
Height: 70 in
Weight: 3234.59 oz

## 2020-03-22 LAB — BASIC METABOLIC PANEL
Anion gap: 11 (ref 5–15)
BUN: 38 mg/dL — ABNORMAL HIGH (ref 8–23)
CO2: 26 mmol/L (ref 22–32)
Calcium: 8 mg/dL — ABNORMAL LOW (ref 8.9–10.3)
Chloride: 97 mmol/L — ABNORMAL LOW (ref 98–111)
Creatinine, Ser: 1.66 mg/dL — ABNORMAL HIGH (ref 0.61–1.24)
GFR, Estimated: 41 mL/min — ABNORMAL LOW (ref >60)
Glucose, Bld: 113 mg/dL — ABNORMAL HIGH (ref 70–99)
Potassium: 3.9 mmol/L (ref 3.5–5.1)
Sodium: 134 mmol/L — ABNORMAL LOW (ref 135–145)

## 2020-03-22 LAB — PREPARE RBC (CROSSMATCH)

## 2020-03-22 LAB — PROTIME-INR
INR: 2.6 — ABNORMAL HIGH (ref 0.8–1.2)
Prothrombin Time: 27.2 s — ABNORMAL HIGH (ref 11.4–15.2)

## 2020-03-22 LAB — T4, FREE: Free T4: 0.98 ng/dL (ref 0.61–1.12)

## 2020-03-22 SURGERY — CREATION, PERICARDIAL WINDOW
Anesthesia: General | Site: Chest

## 2020-03-22 MED ORDER — ROCURONIUM BROMIDE 10 MG/ML (PF) SYRINGE
PREFILLED_SYRINGE | INTRAVENOUS | Status: AC
  Filled 2020-03-22: qty 10

## 2020-03-22 MED ORDER — FENTANYL CITRATE (PF) 250 MCG/5ML IJ SOLN
INTRAMUSCULAR | Status: DC | PRN
  Administered 2020-03-22: 100 ug via INTRAVENOUS
  Administered 2020-03-22: 50 ug via INTRAVENOUS

## 2020-03-22 MED ORDER — VANCOMYCIN HCL 1500 MG/300ML IV SOLN
1500.0000 mg | Freq: Once | INTRAVENOUS | Status: AC
  Administered 2020-03-22: 08:00:00 1500 mg via INTRAVENOUS
  Filled 2020-03-22: qty 300

## 2020-03-22 MED ORDER — SODIUM CHLORIDE 0.9 % IV SOLN
1.5000 g | Freq: Once | INTRAVENOUS | Status: DC
  Filled 2020-03-22 (×3): qty 1.5

## 2020-03-22 MED ORDER — LIDOCAINE 2% (20 MG/ML) 5 ML SYRINGE
INTRAMUSCULAR | Status: AC
  Filled 2020-03-22: qty 5

## 2020-03-22 MED ORDER — ROCURONIUM BROMIDE 10 MG/ML (PF) SYRINGE
PREFILLED_SYRINGE | INTRAVENOUS | Status: DC | PRN
  Administered 2020-03-22: 60 mg via INTRAVENOUS
  Administered 2020-03-22: 20 mg via INTRAVENOUS

## 2020-03-22 MED ORDER — PHENYLEPHRINE 40 MCG/ML (10ML) SYRINGE FOR IV PUSH (FOR BLOOD PRESSURE SUPPORT)
PREFILLED_SYRINGE | INTRAVENOUS | Status: DC | PRN
  Administered 2020-03-22: 50 ug via INTRAVENOUS

## 2020-03-22 MED ORDER — 0.9 % SODIUM CHLORIDE (POUR BTL) OPTIME
TOPICAL | Status: DC | PRN
  Administered 2020-03-22: 09:00:00 2000 mL

## 2020-03-22 MED ORDER — PHENYLEPHRINE HCL-NACL 10-0.9 MG/250ML-% IV SOLN
INTRAVENOUS | Status: DC | PRN
  Administered 2020-03-22: 50 ug/min via INTRAVENOUS

## 2020-03-22 MED ORDER — FENTANYL CITRATE (PF) 250 MCG/5ML IJ SOLN
INTRAMUSCULAR | Status: AC
  Filled 2020-03-22: qty 5

## 2020-03-22 MED ORDER — LACTATED RINGERS IV SOLN: INTRAVENOUS | Status: DC | PRN

## 2020-03-22 MED ORDER — SUGAMMADEX SODIUM 200 MG/2ML IV SOLN
INTRAVENOUS | Status: DC | PRN
  Administered 2020-03-22: 300 mg via INTRAVENOUS

## 2020-03-22 MED ORDER — ONDANSETRON HCL 4 MG/2ML IJ SOLN
INTRAMUSCULAR | Status: AC
  Filled 2020-03-22: qty 2

## 2020-03-22 MED ORDER — ONDANSETRON HCL 4 MG/2ML IJ SOLN
INTRAMUSCULAR | Status: DC | PRN
  Administered 2020-03-22: 4 mg via INTRAVENOUS

## 2020-03-22 MED ORDER — ETOMIDATE 2 MG/ML IV SOLN
INTRAVENOUS | Status: AC
  Filled 2020-03-22: qty 10

## 2020-03-22 MED ORDER — SODIUM CHLORIDE 0.9 % IV SOLN
10.0000 mL/h | Freq: Once | INTRAVENOUS | Status: AC
  Administered 2020-03-22: 14:00:00 10 mL/h via INTRAVENOUS

## 2020-03-22 MED ORDER — ETOMIDATE 2 MG/ML IV SOLN
INTRAVENOUS | Status: DC | PRN
Start: 2020-03-22 — End: 2020-03-22
  Administered 2020-03-22: 12 mg via INTRAVENOUS

## 2020-03-22 SURGICAL SUPPLY — 63 items
ADH SKN CLS APL DERMABOND .7 (GAUZE/BANDAGES/DRESSINGS) ×2
APL SKNCLS STERI-STRIP NONHPOA (GAUZE/BANDAGES/DRESSINGS)
ATTRACTOMAT 16X20 MAGNETIC DRP (DRAPES) ×2 IMPLANT
BENZOIN TINCTURE PRP APPL 2/3 (GAUZE/BANDAGES/DRESSINGS) ×2 IMPLANT
BLADE CLIPPER SURG (BLADE) ×2 IMPLANT
BRUSH SCRUB EZ PLAIN DRY (MISCELLANEOUS) ×1 IMPLANT
CANISTER SUCT 3000ML PPV (MISCELLANEOUS) ×3 IMPLANT
CATH THORACIC 28FR (CATHETERS) IMPLANT
CATH THORACIC 28FR RT ANG (CATHETERS) IMPLANT
CATH THORACIC 36FR (CATHETERS) IMPLANT
CATH THORACIC 36FR RT ANG (CATHETERS) IMPLANT
CNTNR URN SCR LID CUP LEK RST (MISCELLANEOUS) ×2 IMPLANT
CONN ST 1/4X3/8  BEN (MISCELLANEOUS)
CONN ST 1/4X3/8 BEN (MISCELLANEOUS) IMPLANT
CONT SPEC 4OZ STRL OR WHT (MISCELLANEOUS) ×3
COVER SURGICAL LIGHT HANDLE (MISCELLANEOUS) ×2 IMPLANT
DERMABOND ADVANCED (GAUZE/BANDAGES/DRESSINGS) ×1
DERMABOND ADVANCED .7 DNX12 (GAUZE/BANDAGES/DRESSINGS) IMPLANT
DRAIN CHANNEL 28F RND 3/8 FF (WOUND CARE) ×1 IMPLANT
DRAPE LAPAROSCOPIC ABDOMINAL (DRAPES) ×3 IMPLANT
ELECT BLADE 4.0 EZ CLEAN MEGAD (MISCELLANEOUS) ×3
ELECT REM PT RETURN 9FT ADLT (ELECTROSURGICAL) ×3
ELECTRODE BLDE 4.0 EZ CLN MEGD (MISCELLANEOUS) IMPLANT
ELECTRODE REM PT RTRN 9FT ADLT (ELECTROSURGICAL) ×2 IMPLANT
FILTER SMOKE EVAC ULPA (FILTER) ×3 IMPLANT
GAUZE SPONGE 4X4 12PLY STRL (GAUZE/BANDAGES/DRESSINGS) ×1 IMPLANT
GAUZE SPONGE 4X4 12PLY STRL LF (GAUZE/BANDAGES/DRESSINGS) ×1 IMPLANT
GLOVE BIO SURGEON STRL SZ 6.5 (GLOVE) ×7 IMPLANT
GLOVE BIOGEL PI IND STRL 6 (GLOVE) IMPLANT
GLOVE BIOGEL PI IND STRL 7.5 (GLOVE) IMPLANT
GLOVE BIOGEL PI INDICATOR 6 (GLOVE) ×2
GLOVE BIOGEL PI INDICATOR 7.5 (GLOVE) ×3
GOWN STRL REUS W/ TWL LRG LVL3 (GOWN DISPOSABLE) IMPLANT
GOWN STRL REUS W/TWL LRG LVL3 (GOWN DISPOSABLE) ×9
HEMOSTAT POWDER SURGIFOAM 1G (HEMOSTASIS) IMPLANT
KIT BASIN OR (CUSTOM PROCEDURE TRAY) ×3 IMPLANT
KIT TURNOVER KIT B (KITS) ×3 IMPLANT
NS IRRIG 1000ML POUR BTL (IV SOLUTION) ×4 IMPLANT
PACK CHEST (CUSTOM PROCEDURE TRAY) ×3 IMPLANT
PAD ARMBOARD 7.5X6 YLW CONV (MISCELLANEOUS) ×6 IMPLANT
PAD ELECT DEFIB RADIOL ZOLL (MISCELLANEOUS) ×3 IMPLANT
PENCIL SMOKE EVACUATOR (MISCELLANEOUS) ×3 IMPLANT
SLEEVE SUCTION 125 (MISCELLANEOUS) ×3 IMPLANT
STRIP CLOSURE SKIN 1/2X4 (GAUZE/BANDAGES/DRESSINGS) IMPLANT
SUT ETHILON 3 0 FSL (SUTURE) ×1 IMPLANT
SUT SILK  1 MH (SUTURE) ×3
SUT SILK 1 MH (SUTURE) IMPLANT
SUT VIC AB 1 CTX 18 (SUTURE) ×3 IMPLANT
SUT VIC AB 2-0 CTX 27 (SUTURE) ×3 IMPLANT
SUT VIC AB 3-0 X1 27 (SUTURE) ×3 IMPLANT
SWAB COLLECTION DEVICE MRSA (MISCELLANEOUS) IMPLANT
SWAB CULTURE ESWAB REG 1ML (MISCELLANEOUS) IMPLANT
SYR 10ML LL (SYRINGE) IMPLANT
SYR 50ML SLIP (SYRINGE) IMPLANT
SYR BULB IRRIG 60ML STRL (SYRINGE) ×1 IMPLANT
SYSTEM SAHARA CHEST DRAIN ATS (WOUND CARE) ×3 IMPLANT
TAPE CLOTH SURG 4X10 WHT LF (GAUZE/BANDAGES/DRESSINGS) ×1 IMPLANT
TOWEL GREEN STERILE (TOWEL DISPOSABLE) ×3 IMPLANT
TOWEL GREEN STERILE FF (TOWEL DISPOSABLE) ×3 IMPLANT
TRAP FLUID SMOKE EVACUATOR (MISCELLANEOUS) ×3 IMPLANT
TRAP SPECIMEN MUCUS 40CC (MISCELLANEOUS) ×5 IMPLANT
TRAY FOLEY MTR SLVR 14FR STAT (SET/KITS/TRAYS/PACK) ×2 IMPLANT
WATER STERILE IRR 1000ML POUR (IV SOLUTION) ×5 IMPLANT

## 2020-03-22 NOTE — Anesthesia Postprocedure Evaluation (Signed)
Anesthesia Post Note  Patient: Patrick Bell  Procedure(s) Performed: SUBXYPHOID POST-OP PERICARDIAL FLUID DRAINAGE WITH CHEST TUBE INSERTION. (N/A Chest) CHEST TUBE INSERTION OF 28 BLAKE DRAIN. (N/A Chest) TRANSESOPHAGEAL ECHOCARDIOGRAM (TEE) (N/A )     Patient location during evaluation: PACU Anesthesia Type: General Level of consciousness: awake and alert Pain management: pain level controlled Vital Signs Assessment: post-procedure vital signs reviewed and stable Respiratory status: spontaneous breathing, nonlabored ventilation, respiratory function stable and patient connected to nasal cannula oxygen Cardiovascular status: blood pressure returned to baseline and stable Postop Assessment: no apparent nausea or vomiting Anesthetic complications: no   No complications documented.  Last Vitals:  Vitals:   03/22/20 1117 03/22/20 1132  BP: (!) 155/76 140/77  Pulse: 95 97  Resp: 19 17  Temp:  36.7 C  SpO2: 96% 94%    Last Pain:  Vitals:   03/22/20 1132  TempSrc:   PainSc: 0-No pain                 Effie Berkshire

## 2020-03-22 NOTE — Plan of Care (Signed)
  Problem: Education: Goal: Knowledge of General Education information will improve Description: Including pain rating scale, medication(s)/side effects and non-pharmacologic comfort measures Outcome: Progressing   Problem: Clinical Measurements: Goal: Diagnostic test results will improve Outcome: Progressing Goal: Respiratory complications will improve Outcome: Progressing   Problem: Nutrition: Goal: Adequate nutrition will be maintained Outcome: Progressing   Problem: Coping: Goal: Level of anxiety will decrease Outcome: Progressing   Problem: Pain Managment: Goal: General experience of comfort will improve Outcome: Progressing   Problem: Skin Integrity: Goal: Risk for impaired skin integrity will decrease Outcome: Progressing   Problem: Cardiac: Goal: Will achieve and/or maintain hemodynamic stability Outcome: Progressing   Problem: Clinical Measurements: Goal: Postoperative complications will be avoided or minimized Outcome: Progressing   Problem: Respiratory: Goal: Respiratory status will improve Outcome: Progressing

## 2020-03-22 NOTE — Anesthesia Procedure Notes (Signed)
Arterial Line Insertion Start/End12/19/2021 7:30 AM, 03/22/2020 7:40 AM Performed by: CRNA  Patient location: Pre-op. Preanesthetic checklist: patient identified, IV checked, site marked, risks and benefits discussed, surgical consent, monitors and equipment checked, pre-op evaluation, timeout performed and anesthesia consent Lidocaine 1% used for infiltration Right, radial was placed Catheter size: 20 G Hand hygiene performed  and maximum sterile barriers used   Attempts: 2 Procedure performed without using ultrasound guided technique. Following insertion, Biopatch and dressing applied. Post procedure assessment: normal  Patient tolerated the procedure well with no immediate complications.

## 2020-03-22 NOTE — Anesthesia Preprocedure Evaluation (Signed)
Anesthesia Evaluation  Patient identified by MRN, date of birth, ID band Patient awake    Reviewed: Allergy & Precautions, NPO status , Patient's Chart, lab work & pertinent test results  Airway Mallampati: II  TM Distance: >3 FB Neck ROM: Full    Dental  (+) Teeth Intact, Dental Advisory Given   Pulmonary asthma , sleep apnea , former smoker,    breath sounds clear to auscultation       Cardiovascular hypertension, Pt. on medications + dysrhythmias Atrial Fibrillation + Valvular Problems/Murmurs  Rhythm:Irregular Rate:Abnormal  - s/p Mitral Valve repair - s/p aortic dissection repair   Neuro/Psych negative neurological ROS  negative psych ROS   GI/Hepatic Neg liver ROS, GERD  Medicated,  Endo/Other  negative endocrine ROS  Renal/GU negative Renal ROS     Musculoskeletal negative musculoskeletal ROS (+)   Abdominal Normal abdominal exam  (+)   Peds  Hematology negative hematology ROS (+)   Anesthesia Other Findings   Reproductive/Obstetrics                             Anesthesia Physical Anesthesia Plan  ASA: IV  Anesthesia Plan: General   Post-op Pain Management:    Induction: Intravenous  PONV Risk Score and Plan: 3 and Ondansetron and Treatment may vary due to age or medical condition  Airway Management Planned: Oral ETT  Additional Equipment: Arterial line  Intra-op Plan:   Post-operative Plan: Possible Post-op intubation/ventilation  Informed Consent: I have reviewed the patients History and Physical, chart, labs and discussed the procedure including the risks, benefits and alternatives for the proposed anesthesia with the patient or authorized representative who has indicated his/her understanding and acceptance.     Dental advisory given  Plan Discussed with: CRNA  Anesthesia Plan Comments: (Lab Results      Component                Value               Date                       INR                      2.6 (H)             03/22/2020                INR                      2.6 (H)             03/21/2020                INR                      2.7 (H)             03/20/2020             Echo:  1. There is a large pericardial effusion present that present posterior  to the LV and surrounding the apex. It measures 2.2-2.6 cm posterior to  the LV and ~1.6 cm in the apical views. There is ~25% inspiratory  variation in the MV inflow. Due to  bandages, the IVC was not visualized. The effusion is new since the last  echocardiogram. Due to hypotension, there are concerns for  tamponade.  Clinical correlation recommended. Large pericardial effusion. The  pericardial effusion is posterior to the left  ventricle and surrounding the apex.  2. Left ventricular ejection fraction, by estimation, is 45 to 50%. The  left ventricle has mildly decreased function. The left ventricle  demonstrates global hypokinesis. Left ventricular diastolic function could  not be evaluated.  3. Right ventricular systolic function is normal. The right ventricular  size is normal.  4. MV repair with 32 mm sorin annuloplasty ring. MG 3.1 mmHG @ 98 bpm.  The mitral valve has been repaired/replaced. No evidence of mitral valve  regurgitation. There is a 32 mm prosthetic annuloplasty ring present in  the mitral position. Procedure Date:  03/11/2020.  5. The aortic valve is tricuspid. Aortic valve regurgitation is mild. No  aortic stenosis is present.  6. Ascending aorta repair. Measures 3.0 cm. Aortic root/ascending aorta  has been repaired/replaced. )        Anesthesia Quick Evaluation

## 2020-03-22 NOTE — Transfer of Care (Signed)
Immediate Anesthesia Transfer of Care Note  Patient: RADIN RAPTIS  Procedure(s) Performed: SUBXYPHOID POST-OP PERICARDIAL FLUID DRAINAGE WITH CHEST TUBE INSERTION. (N/A Chest) CHEST TUBE INSERTION OF 28 BLAKE DRAIN. (N/A Chest) TRANSESOPHAGEAL ECHOCARDIOGRAM (TEE) (N/A )  Patient Location: PACU  Anesthesia Type:General  Level of Consciousness: awake, alert  and oriented  Airway & Oxygen Therapy: Patient Spontanous Breathing and Patient connected to face mask oxygen  Post-op Assessment: Report given to RN and Post -op Vital signs reviewed and stable  Post vital signs: Reviewed and stable  Last Vitals:  Vitals Value Taken Time  BP 136/73 03/22/20 0917  Temp 36.1 C 03/22/20 0917  Pulse 92 03/22/20 0924  Resp 18 03/22/20 0924  SpO2 98 % 03/22/20 0924  Vitals shown include unvalidated device data.  Last Pain:  Vitals:   03/22/20 0917  TempSrc:   PainSc: 0-No pain      Patients Stated Pain Goal: 0 (88/32/54 9826)  Complications: No complications documented.

## 2020-03-22 NOTE — Anesthesia Procedure Notes (Signed)
Procedure Name: Intubation Date/Time: 03/22/2020 8:15 AM Performed by: Mariea Clonts, CRNA Pre-anesthesia Checklist: Patient identified, Emergency Drugs available, Suction available and Patient being monitored Patient Re-evaluated:Patient Re-evaluated prior to induction Oxygen Delivery Method: Circle System Utilized Preoxygenation: Pre-oxygenation with 100% oxygen Induction Type: IV induction Ventilation: Mask ventilation without difficulty and Oral airway inserted - appropriate to patient size Laryngoscope Size: Sabra Heck and 2 Grade View: Grade II Tube type: Oral Tube size: 7.5 mm Number of attempts: 1 Airway Equipment and Method: Stylet and Oral airway Placement Confirmation: ETT inserted through vocal cords under direct vision,  positive ETCO2 and breath sounds checked- equal and bilateral Tube secured with: Tape Dental Injury: Teeth and Oropharynx as per pre-operative assessment

## 2020-03-22 NOTE — Brief Op Note (Signed)
      [address]       [place] [address]             [phone]       03/22/2020  9:07 AM  PATIENT:  Patrick Bell  79 y.o. male  PRE-OPERATIVE DIAGNOSIS:  POST MITRAL VALVE REPAIR/ postop Pericardial effusion   POST-OPERATIVE DIAGNOSIS:  SUBXYPHOID POST-OP PERICARDIAL FLUID DRAINAGE  PROCEDURE:  Procedure(s): SUBXYPHOID POST-OP PERICARDIAL FLUID DRAINAGE WITH CHEST TUBE INSERTION. (N/A) CHEST TUBE INSERTION OF 28 BLAKE DRAIN. (N/A)  SURGEON:  Surgeon(s) and Role:    * Grace Isaac, MD - Primary   ASSISTANTS: Marguerita Beards RNFA   ANESTHESIA:   general  YHO:OILNZ loss minimal   BLOOD ADMINISTERED:none  DRAINS: 28 Blake pericardial drain    LOCAL MEDICATIONS USED:  NONE  SPECIMEN:  Source of Specimen:  pericardial fluid  DISPOSITION OF SPECIMEN:  culture  COUNTS:  YES   DICTATION: .Dragon Dictation  PLAN OF CARE: transfer to [facility]  PATIENT DISPOSITION:  PACU - hemodynamically stable.   Delay start of Pharmacological VTE agent (>24hrs) due to surgical blood loss or risk of bleeding: yes  Findings 250-300 ml of dark blood apical and posterior pericardium, no blood or clott around rv, tee showed good function of aortic valve and mitral repair

## 2020-03-22 NOTE — Progress Notes (Signed)
EVENING ROUNDS NOTE :     [address]       [place] [address]             [phone]                 Day of Surgery Procedure(s) (LRB): SUBXYPHOID POST-OP PERICARDIAL FLUID DRAINAGE WITH CHEST TUBE INSERTION. (N/A) CHEST TUBE INSERTION OF 28 BLAKE DRAIN. (N/A) TRANSESOPHAGEAL ECHOCARDIOGRAM (TEE) (N/A)  Total Length of Stay:  LOS: 11 days  BP 134/77 (BP Location: Right Arm)   Pulse 93   Temp 98 F (36.7 C)   Resp 16   Ht 5\' 10"  (1.778 m)   Wt 91.7 kg   SpO2 95%   BMI 29.01 kg/m   .Intake/Output      12/18 0701 12/19 0700 12/19 0701 12/20 0700   P.O. 360    I.V. (mL/kg) 338.7 (3.7) 615.5 (6.7)   IV Piggyback  300   Total Intake(mL/kg) 698.7 (7.6) 915.5 (10)   Urine (mL/kg/hr) 500 (0.2) 655 (0.8)   Stool 0    Blood  200   Chest Tube  66   Total Output 500 921   Net +198.7 -5.5        Urine Occurrence 1 x    Stool Occurrence 1 x      . amiodarone 30 mg/hr (03/22/20 1400)  . cefUROXime (ZINACEF)  IV       Lab Results  Component Value Date   WBC 15.7 (H) 03/22/2020   HGB 7.9 (L) 03/22/2020   HCT 24.6 (L) 03/22/2020   PLT 281 03/22/2020   GLUCOSE 113 (H) 03/22/2020   CHOL 184 11/14/2019   TRIG 87 11/14/2019   HDL 70 11/14/2019   LDLCALC 98 11/14/2019   ALT 24 03/16/2020   AST 30 03/16/2020   NA 134 (L) 03/22/2020   K 3.9 03/22/2020   CL 97 (L) 03/22/2020   CREATININE 1.66 (H) 03/22/2020   BUN 38 (H) 03/22/2020   CO2 26 03/22/2020   TSH 6.518 (H) 03/22/2020   PSA 2.4 07/29/2014   INR 2.6 (H) 03/22/2020   HGBA1C 5.4 03/09/2020   Awake and alert, minimal drainage  from  Pericardial time  BP highter   Grace Isaac MD  [facility] [phone] Office [phone] 03/22/2020 4:11 PM

## 2020-03-22 NOTE — Progress Notes (Signed)
  Echocardiogram Echocardiogram Transesophageal has been performed.  Johny Chess 03/22/2020, 1:57 PM

## 2020-03-22 NOTE — Progress Notes (Signed)
Pre Procedure note for inpatients:   Patrick Bell has been scheduled for Procedure(s): DRAINAGE PERICARDIAL EFFUSION (N/A) today. The various methods of treatment have been discussed with the patient. After consideration of the risks, benefits and treatment options the patient has consented to the planned procedure.   The patient has been seen and labs reviewed. There are no changes in the patient's condition to prevent proceeding with the planned procedure today.  Recent labs:  Lab Results  Component Value Date   WBC 15.7 (H) 03/22/2020   HGB 7.9 (L) 03/22/2020   HCT 24.6 (L) 03/22/2020   PLT 281 03/22/2020   GLUCOSE 113 (H) 03/22/2020   CHOL 184 11/14/2019   TRIG 87 11/14/2019   HDL 70 11/14/2019   LDLCALC 98 11/14/2019   ALT 24 03/16/2020   AST 30 03/16/2020   NA 134 (L) 03/22/2020   K 3.9 03/22/2020   CL 97 (L) 03/22/2020   CREATININE 1.66 (H) 03/22/2020   BUN 38 (H) 03/22/2020   CO2 26 03/22/2020   TSH 6.518 (H) 03/22/2020   PSA 2.4 07/29/2014   INR 2.6 (H) 03/22/2020   HGBA1C 5.4 03/09/2020    Grace Isaac, MD 03/22/2020 7:10 AM

## 2020-03-23 ENCOUNTER — Encounter (HOSPITAL_COMMUNITY): Payer: Self-pay | Admitting: Cardiothoracic Surgery

## 2020-03-23 ENCOUNTER — Inpatient Hospital Stay (HOSPITAL_COMMUNITY): Payer: Medicare Other

## 2020-03-23 DIAGNOSIS — I4819 Other persistent atrial fibrillation: Secondary | ICD-10-CM

## 2020-03-23 DIAGNOSIS — I314 Cardiac tamponade: Secondary | ICD-10-CM

## 2020-03-23 LAB — COMPREHENSIVE METABOLIC PANEL
ALT: 32 U/L (ref 0–44)
AST: 35 U/L (ref 15–41)
Albumin: 2 g/dL — ABNORMAL LOW (ref 3.5–5.0)
Alkaline Phosphatase: 62 U/L (ref 38–126)
Anion gap: 7 (ref 5–15)
BUN: 31 mg/dL — ABNORMAL HIGH (ref 8–23)
CO2: 27 mmol/L (ref 22–32)
Calcium: 7.7 mg/dL — ABNORMAL LOW (ref 8.9–10.3)
Chloride: 98 mmol/L (ref 98–111)
Creatinine, Ser: 1.63 mg/dL — ABNORMAL HIGH (ref 0.61–1.24)
GFR, Estimated: 42 mL/min — ABNORMAL LOW (ref >60)
Glucose, Bld: 145 mg/dL — ABNORMAL HIGH (ref 70–99)
Potassium: 4.4 mmol/L (ref 3.5–5.1)
Sodium: 132 mmol/L — ABNORMAL LOW (ref 135–145)
Total Bilirubin: 0.7 mg/dL (ref 0.3–1.2)
Total Protein: 4.9 g/dL — ABNORMAL LOW (ref 6.5–8.1)

## 2020-03-23 LAB — CBC
HCT: 25.8 % — ABNORMAL LOW (ref 39.0–52.0)
Hemoglobin: 8.2 g/dL — ABNORMAL LOW (ref 13.0–17.0)
MCH: 28.9 pg (ref 26.0–34.0)
MCHC: 31.8 g/dL (ref 30.0–36.0)
MCV: 90.8 fL (ref 80.0–100.0)
Platelets: 296 10*3/uL (ref 150–400)
RBC: 2.84 MIL/uL — ABNORMAL LOW (ref 4.22–5.81)
RDW: 14.6 % (ref 11.5–15.5)
WBC: 16.6 10*3/uL — ABNORMAL HIGH (ref 4.0–10.5)
nRBC: 0 % (ref 0.0–0.2)

## 2020-03-23 LAB — PROTIME-INR
INR: 3.6 — ABNORMAL HIGH (ref 0.8–1.2)
Prothrombin Time: 34.5 seconds — ABNORMAL HIGH (ref 11.4–15.2)

## 2020-03-23 LAB — PREALBUMIN: Prealbumin: 13.1 mg/dL — ABNORMAL LOW (ref 18–38)

## 2020-03-23 LAB — T3: T3, Total: 47 ng/dL — ABNORMAL LOW (ref 71–180)

## 2020-03-23 MED ORDER — AMIODARONE HCL 200 MG PO TABS
200.0000 mg | ORAL_TABLET | Freq: Every day | ORAL | Status: DC
  Administered 2020-03-23 – 2020-03-31 (×9): 200 mg via ORAL
  Filled 2020-03-23 (×9): qty 1

## 2020-03-23 MED ORDER — LEVOTHYROXINE SODIUM 25 MCG PO TABS
25.0000 ug | ORAL_TABLET | Freq: Every day | ORAL | Status: DC
  Administered 2020-03-24 – 2020-03-31 (×8): 25 ug via ORAL
  Filled 2020-03-23 (×8): qty 1

## 2020-03-23 MED ORDER — ALBUTEROL SULFATE HFA 108 (90 BASE) MCG/ACT IN AERS: 2.0000 | INHALATION_SPRAY | Freq: Four times a day (QID) | RESPIRATORY_TRACT | Status: DC | PRN

## 2020-03-23 MED ORDER — FUROSEMIDE 10 MG/ML IJ SOLN
40.0000 mg | Freq: Two times a day (BID) | INTRAMUSCULAR | Status: DC
  Administered 2020-03-23 (×2): 40 mg via INTRAVENOUS
  Filled 2020-03-23 (×3): qty 4

## 2020-03-23 MED ORDER — POTASSIUM CHLORIDE CRYS ER 10 MEQ PO TBCR
10.0000 meq | EXTENDED_RELEASE_TABLET | Freq: Two times a day (BID) | ORAL | Status: DC
  Administered 2020-03-23 (×2): 10 meq via ORAL
  Filled 2020-03-23 (×3): qty 1

## 2020-03-23 MED ORDER — ALBUTEROL SULFATE (2.5 MG/3ML) 0.083% IN NEBU
2.5000 mg | INHALATION_SOLUTION | Freq: Four times a day (QID) | RESPIRATORY_TRACT | Status: DC | PRN
  Administered 2020-03-24 – 2020-03-30 (×7): 2.5 mg via RESPIRATORY_TRACT
  Filled 2020-03-23 (×7): qty 3

## 2020-03-23 NOTE — Progress Notes (Signed)
1 Day Post-Op Procedure(s) (LRB): SUBXYPHOID POST-OP PERICARDIAL FLUID DRAINAGE WITH CHEST TUBE INSERTION. (N/A) CHEST TUBE INSERTION OF 28 BLAKE DRAIN. (N/A) TRANSESOPHAGEAL ECHOCARDIOGRAM (TEE) (N/A) Subjective: Feels weak today but said he did not sleep well. Ambulated with PT this morning.  Objective: Vital signs in last 24 hours: Temp:  [96.9 F (36.1 C)-97.7 F (36.5 C)] 96.9 F (36.1 C) (12/20 0755) Pulse Rate:  [79-102] 94 (12/20 1000) Cardiac Rhythm: Atrial fibrillation (12/20 0800) Resp:  [10-20] 14 (12/20 1000) BP: (91-150)/(54-93) 118/76 (12/20 1000) SpO2:  [90 %-100 %] 97 % (12/20 1000) Arterial Line BP: (109-196)/(53-80) 137/59 (12/19 2200) Weight:  [93 kg] 93 kg (12/20 0600)  Hemodynamic parameters for last 24 hours:    Intake/Output from previous day: 12/19 0701 - 12/20 0700 In: 1441.2 [P.O.:240; I.V.:901.2; IV Piggyback:300] Out: 4076 [Urine:1340; Blood:200; Chest Tube:116] Intake/Output this shift: Total I/O In: 369.4 [P.O.:300; I.V.:69.4] Out: 330 [Urine:300; Chest Tube:30]  General appearance: alert and cooperative Neurologic: intact Heart: irregularly irregular rhythm Lungs: diminished breath sounds bibasilar Abdomen: soft, non-tender; bowel sounds normal; no masses,  no organomegaly Extremities: edema moderate Wound: incision ok  Lab Results: Recent Labs    03/22/20 0111 03/23/20 0355  WBC 15.7* 16.6*  HGB 7.9* 8.2*  HCT 24.6* 25.8*  PLT 281 296   BMET:  Recent Labs    03/22/20 0111 03/23/20 0355  NA 134* 132*  K 3.9 4.4  CL 97* 98  CO2 26 27  GLUCOSE 113* 145*  BUN 38* 31*  CREATININE 1.66* 1.63*  CALCIUM 8.0* 7.7*    PT/INR:  Recent Labs    03/23/20 0355  LABPROT 34.5*  INR 3.6*   ABG    Component Value Date/Time   PHART 7.373 03/12/2020 1034   HCO3 26.1 03/12/2020 1034   TCO2 23 03/12/2020 1658   ACIDBASEDEF 2.0 03/12/2020 0909   O2SAT 68.9 03/15/2020 0455   CBG (last 3)  No results for input(s): GLUCAP in the  last 72 hours.  Assessment/Plan: S/P Procedure(s) (LRB): SUBXYPHOID POST-OP PERICARDIAL FLUID DRAINAGE WITH CHEST TUBE INSERTION. (N/A) CHEST TUBE INSERTION OF 28 BLAKE DRAIN. (N/A) TRANSESOPHAGEAL ECHOCARDIOGRAM (TEE) (N/A)  POD 11 MV repair POD 1 pericardial window  Hemodynamically stable   Atrial fib with controlled rate on oral amio. Coumadin with INR increased to 3.6 today. Will hold dose tonight.  Volume excess: wt is 20 lbs over preop. Continue diuresis. Watch creat.  Work on IS, ambulation  Hypothyroidism: TSH elevated and T3 low, nl free T4. Will start low dose Synthroid.  Expected acute postop blood loss anemia: start Fe2+.   LOS: 12 days    Gaye Pollack 03/23/2020

## 2020-03-23 NOTE — Progress Notes (Signed)
Progress Note  Patient Name: Patrick Bell Date of Encounter: 03/23/2020  [facility] Cardiologist: Shelva Majestic, MD   Subjective   79 year old gentleman with a history of mitral valve repair.  He has postoperative atrial fibrillation.  He developed a large pericardial effusion and had pericardial drainage with chest tube yesterday.   He is developed some edema of his hands and ankles/lower legs.   Inpatient Medications    Scheduled Meds: . bisacodyl  10 mg Oral Daily   Or  . bisacodyl  10 mg Rectal Daily  . Chlorhexidine Gluconate Cloth  6 each Topical Daily  . digoxin  0.125 mg Oral Daily  . docusate sodium  200 mg Oral Daily  . ferrous [id number] C-folic acid  1 capsule Oral Q breakfast  . furosemide  40 mg Intravenous BID  . mouth rinse  15 mL Mouth Rinse BID  . midodrine  2.5 mg Oral BID WC  . montelukast  10 mg Oral QHS  . pantoprazole  40 mg Oral QHS  . potassium chloride  10 mEq Oral BID  . simvastatin  10 mg Oral QHS  . warfarin  1 mg Oral q1600  . Warfarin - Physician Dosing Inpatient   Does not apply q1600   Continuous Infusions: . amiodarone 30 mg/hr (03/23/20 0800)  . cefUROXime (ZINACEF)  IV     PRN Meds: benzonatate, metoprolol tartrate, ondansetron (ZOFRAN) IV, oxyCODONE, traMADol   Vital Signs    Vitals:   03/23/20 0700 03/23/20 0755 03/23/20 0800 03/23/20 0824  BP: 123/82  113/73   Pulse: 100  (!) 102 90  Resp: 13  10   Temp:  (!) 96.9 F (36.1 C)    TempSrc:  Axillary    SpO2: 90%  98%   Weight:      Height:        Intake/Output Summary (Last 24 hours) at 03/23/2020 0933 Last data filed at 03/23/2020 0800 Gross per 24 hour  Intake 974.53 ml  Output 1342 ml  Net -367.47 ml   Last 3 Weights 03/23/2020 03/22/2020 03/21/2020  Weight (lbs) 205 lb 0.4 oz 202 lb 2.6 oz 205 lb 7.5 oz  Weight (kg) 93 kg 91.7 kg 93.2 kg      Telemetry    afib with RVR - Personally Reviewed  ECG     - Personally Reviewed  Physical  Exam   GEN: No acute distress.  , elderly male,  NAD  Neck: No JVD Cardiac: irreg. Irreg.  Respiratory: Clear to auscultation bilaterally. GI: Soft, nontender, non-distended  MS: 2+ pitting edema in hands and lower legs  Neuro:  Nonfocal  Psych: Normal affect   Labs    High Sensitivity Troponin:  No results for input(s): TROPONINIHS in the last 720 hours.    Chemistry Recent Labs  Lab 03/21/20 0414 03/21/20 1407 03/22/20 0111 03/23/20 0355  NA 133*  --  134* 132*  K 4.1  --  3.9 4.4  CL 97*  --  97* 98  CO2 25  --  26 27  GLUCOSE 113*  --  113* 145*  BUN 37*  --  38* 31*  CREATININE 1.64*  --  1.66* 1.63*  CALCIUM 8.0*  --  8.0* 7.7*  PROT  --   --   --  4.9*  ALBUMIN  --  2.1*  --  2.0*  AST  --   --   --  35  ALT  --   --   --  32  ALKPHOS  --   --   --  22  BILITOT  --   --   --  0.7  GFRNONAA 42*  --  41* 42*  ANIONGAP 11  --  11 7     Hematology Recent Labs  Lab 03/21/20 1407 03/22/20 0111 03/23/20 0355  WBC 16.2* 15.7* 16.6*  RBC 2.78* 2.76* 2.84*  HGB 8.0* 7.9* 8.2*  HCT 24.9* 24.6* 25.8*  MCV 89.6 89.1 90.8  MCH 28.8 28.6 28.9  MCHC 32.1 32.1 31.8  RDW 14.4 14.6 14.6  PLT 293 281 296    BNPNo results for input(s): BNP, PROBNP in the last 168 hours.   DDimer No results for input(s): DDIMER in the last 168 hours.   Radiology    DG Chest Port 1 View  Result Date: 03/23/2020 CLINICAL DATA:  Chest tube.  Mitral valve replacement. EXAM: PORTABLE CHEST 1 VIEW COMPARISON:  03/22/2020. FINDINGS: Left PICC line stable position. Pericardial drainage catheter stable position. Prior cardiac valve replacement. Cardiomegaly. Persistent bibasilar atelectasis. Progressive bibasilar infiltrates/edema and bilateral pleural effusions. Findings suggest CHF. IMPRESSION: 1. Left PICC line stable position. Pericardial drainage catheter stable position. 2. Prior cardiac valve replacement. Cardiomegaly with progressive bibasilar infiltrates/edema and bilateral pleural  effusions. Findings suggest CHF. 3. Persistent bibasilar atelectasis. Electronically Signed   By: Marcello Moores  Register   On: 03/23/2020 06:31   DG CHEST PORT 1 VIEW  Result Date: 03/22/2020 CLINICAL DATA:  Status post pericardial surgery. EXAM: PORTABLE CHEST 1 VIEW COMPARISON:  March 21, 2020 FINDINGS: Minimal hazy opacity in the right base is favored represent atelectasis. The left PICC line terminates in the central SVC, stable. Opacity in the left base is likely effusion and atelectasis. No pneumothorax. No change in the cardiomediastinal silhouette. IMPRESSION: 1. No interval change in left PICC line or left pleural effusion with underlying atelectasis. No pneumothorax. 2. Minimal hazy opacity in the right base is favored represent atelectasis. 3. No other changes. Electronically Signed   By: Dorise Bullion III M.D   On: 03/22/2020 11:37   DG CHEST PORT 1 VIEW  Result Date: 03/21/2020 CLINICAL DATA:  Status post mitral valve repair. Shortness of breath. EXAM: PORTABLE CHEST 1 VIEW COMPARISON:  03/19/2020 FINDINGS: Left arm PICC line with the tip in the upper right atrium. Surgical changes compatible with mitral valve surgery. Hazy densities in the lateral aspect of the right chest are nonspecific. Heart size is enlarged. Improved aeration at the right lung base. Persistent densities at the left lung base. Negative for pneumothorax. IMPRESSION: 1. Persistent opacities at the left lung base. Findings could represent a combination atelectasis and small pleural effusion. 2. Improved aeration at the right lung base. Faint densities overlying the right hemithorax are nonspecific and could be related to overlying structures. 3. Stable cardiomegaly. 4. PICC line tip near the upper right atrium. Electronically Signed   By: Markus Daft M.D.   On: 03/21/2020 14:56   ECHOCARDIOGRAM LIMITED  Result Date: 03/21/2020    ECHOCARDIOGRAM LIMITED REPORT   Patient Name:   Patrick Bell Date of Exam: 03/21/2020  Medical Rec #:  [id number]       Height:       70.0 in Accession #:    [id number]      Weight:       205.5 lb Date of Birth:  27-Mar-1941       BSA:          2.111 m Patient Age:  79 years        BP:           91/60 mmHg Patient Gender: M               HR:           103 bpm. Exam Location:  Inpatient Procedure: Limited Echo, Cardiac Doppler and Color Doppler REPORT CONTAINS CRITICAL RESULT Indications:    Hypotension.  History:        Patient has prior history of Echocardiogram examinations, most                 recent 12/19/2019. Aortic Dissection, Mitral Valve Disease and                 Mitral Valve Prolapse; Risk Factors:Hypertension, Dyslipidemia                 and Sleep Apnea. Mitral valve repair. Ascending aortic                 dissection repair. Severe mitral regurgitation. Edema.                  Mitral Valve: 32 mm prosthetic annuloplasty ring valve is                 present in the mitral position. Procedure Date: 03/11/2020.  Sonographer:    Roseanna Rainbow RDCS Referring Phys: [id number] [place]  Sonographer Comments: Technically difficult study due to poor echo windows and no subcostal window. Bandage in subcostal region. IMPRESSIONS  1. There is a large pericardial effusion present that present posterior to the LV and surrounding the apex. It measures 2.2-2.6 cm posterior to the LV and ~1.6 cm in the apical views. There is ~25% inspiratory variation in the MV inflow. Due to bandages, the IVC was not visualized. The effusion is new since the last echocardiogram. Due to hypotension, there are concerns for tamponade. Clinical correlation recommended. Large pericardial effusion. The pericardial effusion is posterior to the left  ventricle and surrounding the apex.  2. Left ventricular ejection fraction, by estimation, is 45 to 50%. The left ventricle has mildly decreased function. The left ventricle demonstrates global hypokinesis. Left ventricular diastolic function could not be evaluated.  3. Right  ventricular systolic function is normal. The right ventricular size is normal.  4. MV repair with 32 mm sorin annuloplasty ring. MG 3.1 mmHG @ 98 bpm. The mitral valve has been repaired/replaced. No evidence of mitral valve regurgitation. There is a 32 mm prosthetic annuloplasty ring present in the mitral position. Procedure Date: 03/11/2020.  5. The aortic valve is tricuspid. Aortic valve regurgitation is mild. No aortic stenosis is present.  6. Ascending aorta repair. Measures 3.0 cm. Aortic root/ascending aorta has been repaired/replaced. Comparison(s): Changes from prior study are noted. FINDINGS  Left Ventricle: Left ventricular ejection fraction, by estimation, is 45 to 50%. The left ventricle has mildly decreased function. The left ventricle demonstrates global hypokinesis. The left ventricular internal cavity size was normal in size. There is  no left ventricular hypertrophy. Abnormal (paradoxical) septal motion consistent with post-operative status. Left ventricular diastolic function could not be evaluated. Left ventricular diastolic function could not be evaluated due to atrial fibrillation. Right Ventricle: The right ventricular size is normal. No increase in right ventricular wall thickness. Right ventricular systolic function is normal. Left Atrium: Left atrial size was normal in size. Right Atrium: Right atrial size was normal in size. Pericardium: There is a large pericardial  effusion present that present posterior to the LV and surrounding the apex. It measures 2.2-2.6 cm posterior to the LV and ~1.6 cm in the apical views. There is ~25% inspiratory variation in the MV inflow. Due to  bandages, the IVC was not visualized. The effusion is new since the last echocardiogram. Due to hypotension, there are concerns for tamponade. Clinical correlation recommended. A large pericardial effusion is present. The pericardial effusion is posterior to the left ventricle and surrounding the apex. Mitral Valve: MV  repair with 32 mm sorin annuloplasty ring. MG 3.1 mmHG @ 98 bpm. The mitral valve has been repaired/replaced. There is a 32 mm prosthetic annuloplasty ring present in the mitral position. Procedure Date: 03/11/2020. MV peak gradient, 10.2  mmHg. The mean mitral valve gradient is 3.1 mmHg. Tricuspid Valve: The tricuspid valve is grossly normal. Tricuspid valve regurgitation is mild . No evidence of tricuspid stenosis. Aortic Valve: The aortic valve is tricuspid. Aortic valve regurgitation is mild. No aortic stenosis is present. Pulmonic Valve: The pulmonic valve was not well visualized. Aorta: Ascending aorta repair. Measures 3.0 cm. The aortic root/ascending aorta has been repaired/replaced. Venous: The inferior vena cava was not well visualized. IAS/Shunts: The atrial septum is grossly normal. LEFT VENTRICLE PLAX 2D LVIDd:         4.20 cm LVIDs:         3.40 cm LV PW:         1.00 cm LV IVS:        1.20 cm LVOT diam:     2.00 cm LV SV:         34 LV SV Index:   16 LVOT Area:     3.14 cm  LV Volumes (MOD) LV vol d, MOD A2C: 73.1 ml LV vol d, MOD A4C: 55.6 ml LV vol s, MOD A2C: 37.2 ml LV vol s, MOD A4C: 39.4 ml LV SV MOD A2C:     35.9 ml LV SV MOD A4C:     55.6 ml LV SV MOD BP:      27.3 ml IVC IVC diam: 1.80 cm LEFT ATRIUM         Index LA diam:    3.50 cm 1.66 cm/m  AORTIC VALVE LVOT Vmax:   74.68 cm/s LVOT Vmean:  50.538 cm/s LVOT VTI:    0.109 m  AORTA Ao Root diam: 3.40 cm Ao Asc diam:  3.00 cm MITRAL VALVE                TRICUSPID VALVE MV Area (PHT): 3.70 cm     TR Peak grad:   18.5 mmHg MV Area VTI:   1.33 cm     TR Vmax:        215.00 cm/s MV Peak grad:  10.2 mmHg MV Mean grad:  3.1 mmHg     SHUNTS MV Vmax:       1.60 m/s     Systemic VTI:  0.11 m MV Vmean:      67.8 cm/s    Systemic Diam: 2.00 cm MV VTI:        0.26 m MV Decel Time: 205 msec MV E velocity: 143.33 cm/s Eleonore Chiquito MD Electronically signed by Eleonore Chiquito MD Signature Date/Time: 03/21/2020/3:44:40 PM    Final     Cardiac Studies       Patient Profile     79 y.o. male with recent mitral valve repair , atrial fib   Assessment & Plan    1.  Mitral valve repair: Seems to be stable   2.  Atrial fib:  Still on amio drip .  Will DC amio drip and change to PO amio.  He has been loaded with > 8 grams of amio . Anticipate TEE / CV at some point .    3.  Pericardial effusion / pericardial tamponade:   S/p drainage of pericardial fluid by Dr. Servando Snare yesterday   4.   Edema:   Pericardial effusion has been drained.   Will start lasix today       For questions or updates, please contact [facility] Please consult [email or web address] for contact info under        Signed, Mertie Moores, MD  03/23/2020, 9:33 AM

## 2020-03-23 NOTE — Progress Notes (Signed)
Physical Therapy Treatment Patient Details Name: Patrick Bell MRN: [id number] DOB: 1940/08/13 Today's Date: 03/23/2020    History of Present Illness Patient is a 79 y/o male who presents s/p MVP and repair of acute ascending thoracic aortic dissection 12/8. Pericardial fluid drainage with chest tube placement on 12/19. PMH includes HTN.    PT Comments    Pt admitted with above diagnosis. Pt was able to ambulate with min assist for safety with mod cues for upright posture as pt tends to flex at trunk unless constantly cued. Pt not dizzy today with VSS and progressed distance with walking.  Fatigues but can push himself.  Will follow acutely.  Pt currently with functional limitations due to balance and endurance deficits. Pt will benefit from skilled PT to increase their independence and safety with mobility to allow discharge to the venue listed below.     Follow Up Recommendations  CIR;Supervision for mobility/OOB;Supervision/Assistance - 24 hour     Equipment Recommendations  Rolling walker with 5" wheels    Recommendations for Other Services Rehab consult     Precautions / Restrictions Precautions Precautions: Sternal;Fall Precaution Booklet Issued: No Precaution Comments: reviewed "move in the tube" and no pushing/pulling with UEs, Restrictions Other Position/Activity Restrictions: Sternal precautions    Mobility  Bed Mobility Overal bed mobility: Needs Assistance Bed Mobility: Rolling;Sit to Sidelying Rolling: Min assist       Sit to sidelying: Min assist;+2 for safety/equipment General bed mobility comments: Min A for assist to elevate BLEs over EOB. +2 for safety  Transfers Overall transfer level: Needs assistance Equipment used: Rolling walker (2 wheeled) Transfers: Sit to/from Stand Sit to Stand: Min assist;+2 safety/equipment         General transfer comment: Cues for hand placement on B knees to push into standing.  Pt frequently attempting to reach  outside "the tube"  Ambulation/Gait Ambulation/Gait assistance: Min assist;+2 safety/equipment Gait Distance (Feet): 125 Feet Assistive device:  (Eva walker) Gait Pattern/deviations: Decreased stride length;Trunk flexed     General Gait Details: cued pt constantly to keep trunk upright as he tends to flex at trunk.  Pt progressed ambulationa nd VSS.   Stairs             Wheelchair Mobility    Modified Rankin (Stroke Patients Only)       Balance Overall balance assessment: Needs assistance Sitting-balance support: Feet supported;No upper extremity supported Sitting balance-Leahy Scale: Fair Sitting balance - Comments: Supervision for safety, prefers to lean on RW or legs for support.   Standing balance support: Bilateral upper extremity supported Standing balance-Leahy Scale: Poor Standing balance comment: Requires UE support in standing.                            Cognition Arousal/Alertness: Awake/alert Behavior During Therapy: WFL for tasks assessed/performed Overall Cognitive Status: Within Functional Limits for tasks assessed                                        Exercises      General Comments General comments (skin integrity, edema, etc.): SpO2 97% on 4L. HR  and BP stable      Pertinent Vitals/Pain Pain Assessment: Faces Faces Pain Scale: Hurts even more Pain Location: low back Pain Descriptors / Indicators: Aching;Constant Pain Intervention(s): Limited activity within patient's tolerance;Monitored during session;Repositioned    Home  Living                      Prior Function            PT Goals (current goals can now be found in the care plan section) Acute Rehab PT Goals Patient Stated Goal: I would like to have more energy Progress towards PT goals: Progressing toward goals    Frequency    Min 3X/week      PT Plan Current plan remains appropriate    Co-evaluation PT/OT/SLP  Co-Evaluation/Treatment: Yes Reason for Co-Treatment: Complexity of the patient's impairments (multi-system involvement);For patient/therapist safety PT goals addressed during session: Mobility/safety with mobility OT goals addressed during session: ADL's and self-care      AM-PAC PT "6 Clicks" Mobility   Outcome Measure  Help needed turning from your back to your side while in a flat bed without using bedrails?: A Little Help needed moving from lying on your back to sitting on the side of a flat bed without using bedrails?: A Little Help needed moving to and from a bed to a chair (including a wheelchair)?: A Little Help needed standing up from a chair using your arms (e.g., wheelchair or bedside chair)?: A Little Help needed to walk in hospital room?: A Little Help needed climbing 3-5 steps with a railing? : A Little 6 Click Score: 18    End of Session Equipment Utilized During Treatment: Gait belt;Oxygen Activity Tolerance: Patient limited by fatigue Patient left: with call bell/phone within reach;with bed alarm set;in bed;with family/visitor present Nurse Communication: Mobility status PT Visit Diagnosis: Muscle weakness (generalized) (M62.81);Unsteadiness on feet (R26.81);Difficulty in walking, not elsewhere classified (R26.2);Dizziness and giddiness (R42)     Time: 1937-9024 PT Time Calculation (min) (ACUTE ONLY): 27 min  Charges:  $Gait Training: 8-22 mins                     Remas Sobel W,PT [place] Pager:  [phone]  Office:  [place] 03/23/2020, 4:00 PM

## 2020-03-23 NOTE — Progress Notes (Addendum)
Occupational Therapy Treatment Patient Details Name: Patrick Bell MRN: [id number] DOB: 1940-12-11 Today's Date: 03/23/2020    History of present illness Patient is a 79 y/o male who presents s/p MVP and repair of acute ascending thoracic aortic dissection 12/8. Pericardial fluid drainage with chest tube placement on 12/19. PMH includes HTN.    OT comments  Pt continues to be motivated to participate in therapy despite pain and fatigue. Pt requiring Min A +2 for functional mobility with use of eva walker. Pt requiring Min tactile cues for correcting posture during mobility. Providing Min cues throughout for sternal precautions. Pt reporting he feels more fatigues than he did prior to chest tube placement yesterday. Continue to recommend dc to CIR and will continue to follow acutely as admitted.    Follow Up Recommendations  CIR    Equipment Recommendations  3 in 1 bedside commode    Recommendations for Other Services Rehab consult    Precautions / Restrictions Precautions Precautions: Sternal;Fall Precaution Booklet Issued: No Precaution Comments: reviewed "move in the tube" and no pushing/pulling with UEs, Restrictions Other Position/Activity Restrictions: Sternal precautions       Mobility Bed Mobility Overal bed mobility: Needs Assistance Bed Mobility: Rolling;Sit to Sidelying Rolling: Min assist       Sit to sidelying: Min assist;+2 for safety/equipment General bed mobility comments: Min A for assist to elevate BLEs over EOB. +2 for safety  Transfers Overall transfer level: Needs assistance Equipment used: Rolling walker (2 wheeled) Transfers: Sit to/from Stand Sit to Stand: Min assist;+2 safety/equipment         General transfer comment: Cues for hand placement on B knees to push into standing.  Pt frequently attempting to reach outside "the tube"    Balance Overall balance assessment: Needs assistance Sitting-balance support: Feet supported;No upper  extremity supported Sitting balance-Leahy Scale: Fair Sitting balance - Comments: Supervision for safety, prefers to lean on RW or legs for support.   Standing balance support: Bilateral upper extremity supported Standing balance-Leahy Scale: Poor Standing balance comment: Requires UE support in standing.                           ADL either performed or assessed with clinical judgement   ADL Overall ADL's : Needs assistance/impaired                         Toilet Transfer: Regular Toilet;Ambulation;RW;Minimal assistance;+2 for safety/equipment Toilet Transfer Details (indicate cue type and reason): rocking motion for momentum         Functional mobility during ADLs: Minimal assistance;+2 for safety/equipment;Cueing for safety (eva walker) General ADL Comments: Pt requiring Min A +2 for fucntional transfers and then safety during mobility inhallway. Pt with forward lean into eva walker and requiring tactile cues for upright posture. Fatigues quickly. VSS throughout     Vision       Perception     Praxis      Cognition Arousal/Alertness: Awake/alert Behavior During Therapy: WFL for tasks assessed/performed Overall Cognitive Status: Within Functional Limits for tasks assessed                                          Exercises     Shoulder Instructions       General Comments SpO2 97% on 4L. HR  and BP stable  Pertinent Vitals/ Pain       Pain Assessment: Faces Faces Pain Scale: Hurts even more Pain Location: low back Pain Descriptors / Indicators: Aching;Constant Pain Intervention(s): Monitored during session;Limited activity within patient's tolerance;Repositioned  Home Living                                          Prior Functioning/Environment              Frequency  Min 2X/week        Progress Toward Goals  OT Goals(current goals can now be found in the care plan section)  Progress  towards OT goals: Progressing toward goals  Acute Rehab OT Goals Patient Stated Goal: I would like to have more energy OT Goal Formulation: With patient Time For Goal Achievement: 03/31/20 Potential to Achieve Goals: Good ADL Goals Pt Will Perform Grooming: with modified independence;standing Pt Will Perform Upper Body Dressing: with modified independence;sitting Pt Will Perform Lower Body Dressing: with set-up;sit to/from stand;with caregiver independent in assisting Pt Will Transfer to Toilet: with modified independence;ambulating Pt Will Perform Toileting - Clothing Manipulation and hygiene: with supervision;sit to/from stand Additional ADL Goal #1: Pt will perform bed mobility maintaining sternal precautions at mod I level prior to engaging in ADL  Plan Discharge plan remains appropriate    Co-evaluation    PT/OT/SLP Co-Evaluation/Treatment: Yes Reason for Co-Treatment: To address functional/ADL transfers;For patient/therapist safety (activity tolerance)   OT goals addressed during session: ADL's and self-care      AM-PAC OT "6 Clicks" Daily Activity     Outcome Measure   Help from another person eating meals?: None Help from another person taking care of personal grooming?: A Little Help from another person toileting, which includes using toliet, bedpan, or urinal?: A Little Help from another person bathing (including washing, rinsing, drying)?: A Lot Help from another person to put on and taking off regular upper body clothing?: A Little Help from another person to put on and taking off regular lower body clothing?: A Lot 6 Click Score: 17    End of Session Equipment Utilized During Treatment: Rolling walker;Oxygen  OT Visit Diagnosis: Other abnormalities of gait and mobility (R26.89);Muscle weakness (generalized) (M62.81)   Activity Tolerance Patient tolerated treatment well   Patient Left with call bell/phone within reach;in bed;Other (comment) (with MD)   Nurse  Communication Mobility status        Time: 9211-9417 OT Time Calculation (min): 23 min  Charges: OT General Charges $OT Visit: 1 Visit OT Treatments $Therapeutic Activity: 8-22 mins  [place], OTR/L Acute Rehab Pager: [phone] Office: [facility] 03/23/2020, 2:42 PM

## 2020-03-23 NOTE — Progress Notes (Signed)
Inpatient Rehab Admissions Coordinator:   Continue to follow and await determination from expedited appeal.  Note events of the weekend, now with chest tube, transition to PO amio today, and possible TEE/CV.   Shann Medal, PT, DPT Admissions Coordinator [phone] 03/23/20  11:03 AM

## 2020-03-23 NOTE — Plan of Care (Signed)

## 2020-03-23 NOTE — Progress Notes (Signed)
EVENING ROUNDS NOTE :     [address]       [place] [address]             [phone]                 1 Day Post-Op Procedure(s) (LRB): SUBXYPHOID POST-OP PERICARDIAL FLUID DRAINAGE WITH CHEST TUBE INSERTION. (N/A) CHEST TUBE INSERTION OF 28 BLAKE DRAIN. (N/A) TRANSESOPHAGEAL ECHOCARDIOGRAM (TEE) (N/A)   Total Length of Stay:  LOS: 12 days  Events:   Ambulated today No events Continue diuresis    BP 118/78   Pulse (!) 104   Temp (!) 96.9 F (36.1 C) (Axillary)   Resp 13   Ht 5\' 10"  (1.778 m)   Wt 93 kg   SpO2 (!) 77%   BMI 29.42 kg/m         . cefUROXime (ZINACEF)  IV      I/O last 3 completed shifts: In: 1899.9 [P.O.:360; I.V.:1239.9; IV Piggyback:300] Out: 2056 [Urine:1740; Blood:200; Chest Tube:116]   CBC Latest Ref Rng & Units 03/23/2020 03/22/2020 03/21/2020  WBC 4.0 - 10.5 K/uL 16.6(H) 15.7(H) 16.2(H)  Hemoglobin 13.0 - 17.0 g/dL 8.2(L) 7.9(L) 8.0(L)  Hematocrit 39.0 - 52.0 % 25.8(L) 24.6(L) 24.9(L)  Platelets 150 - 400 K/uL 296 281 293    BMP Latest Ref Rng & Units 03/23/2020 03/22/2020 03/21/2020  Glucose 70 - 99 mg/dL 145(H) 113(H) 113(H)  BUN 8 - 23 mg/dL 31(H) 38(H) 37(H)  Creatinine 0.61 - 1.24 mg/dL 1.63(H) 1.66(H) 1.64(H)  BUN/Creat Ratio 10 - 24 - - -  Sodium 135 - 145 mmol/L 132(L) 134(L) 133(L)  Potassium 3.5 - 5.1 mmol/L 4.4 3.9 4.1  Chloride 98 - 111 mmol/L 98 97(L) 97(L)  CO2 22 - 32 mmol/L 27 26 25   Calcium 8.9 - 10.3 mg/dL 7.7(L) 8.0(L) 8.0(L)    ABG    Component Value Date/Time   PHART 7.373 03/12/2020 1034   PCO2ART 44.6 03/12/2020 1034   PO2ART 195 (H) 03/12/2020 1034   HCO3 26.1 03/12/2020 1034   TCO2 23 03/12/2020 1658   ACIDBASEDEF 2.0 03/12/2020 0909   O2SAT 68.9 03/15/2020 0455       Melodie Bouillon, MD 03/23/2020 3:24 PM

## 2020-03-24 ENCOUNTER — Inpatient Hospital Stay (HOSPITAL_COMMUNITY): Payer: Medicare Other

## 2020-03-24 LAB — BASIC METABOLIC PANEL
Anion gap: 10 (ref 5–15)
BUN: 30 mg/dL — ABNORMAL HIGH (ref 8–23)
CO2: 27 mmol/L (ref 22–32)
Calcium: 7.7 mg/dL — ABNORMAL LOW (ref 8.9–10.3)
Chloride: 95 mmol/L — ABNORMAL LOW (ref 98–111)
Creatinine, Ser: 1.47 mg/dL — ABNORMAL HIGH (ref 0.61–1.24)
GFR, Estimated: 48 mL/min — ABNORMAL LOW (ref >60)
Glucose, Bld: 92 mg/dL (ref 70–99)
Potassium: 4.1 mmol/L (ref 3.5–5.1)
Sodium: 132 mmol/L — ABNORMAL LOW (ref 135–145)

## 2020-03-24 LAB — CBC
HCT: 23.8 % — ABNORMAL LOW (ref 39.0–52.0)
Hemoglobin: 7.8 g/dL — ABNORMAL LOW (ref 13.0–17.0)
MCH: 29.7 pg (ref 26.0–34.0)
MCHC: 32.8 g/dL (ref 30.0–36.0)
MCV: 90.5 fL (ref 80.0–100.0)
Platelets: 319 10*3/uL (ref 150–400)
RBC: 2.63 MIL/uL — ABNORMAL LOW (ref 4.22–5.81)
RDW: 14.6 % (ref 11.5–15.5)
WBC: 14.8 10*3/uL — ABNORMAL HIGH (ref 4.0–10.5)
nRBC: 0 % (ref 0.0–0.2)

## 2020-03-24 LAB — HEMOGLOBIN AND HEMATOCRIT, BLOOD
HCT: 25.3 % — ABNORMAL LOW (ref 39.0–52.0)
Hemoglobin: 8.5 g/dL — ABNORMAL LOW (ref 13.0–17.0)

## 2020-03-24 LAB — PROTIME-INR
INR: 2.3 — ABNORMAL HIGH (ref 0.8–1.2)
Prothrombin Time: 24.6 seconds — ABNORMAL HIGH (ref 11.4–15.2)

## 2020-03-24 MED ORDER — WARFARIN SODIUM 1 MG PO TABS
1.0000 mg | ORAL_TABLET | Freq: Every day | ORAL | Status: DC
  Administered 2020-03-24: 17:00:00 1 mg via ORAL
  Filled 2020-03-24: qty 1

## 2020-03-24 MED ORDER — MIDODRINE HCL 5 MG PO TABS
5.0000 mg | ORAL_TABLET | Freq: Two times a day (BID) | ORAL | Status: DC
  Administered 2020-03-24 – 2020-03-27 (×7): 5 mg via ORAL
  Filled 2020-03-24 (×7): qty 1

## 2020-03-24 MED ORDER — BOOST / RESOURCE BREEZE PO LIQD CUSTOM
237.0000 mL | Freq: Three times a day (TID) | ORAL | Status: DC
  Administered 2020-03-24 – 2020-03-25 (×4): 1 via ORAL

## 2020-03-24 NOTE — Progress Notes (Addendum)
Progress Note  Patient Name: Patrick Bell Date of Encounter: 03/24/2020  [facility] Cardiologist: Shelva Majestic, MD   Subjective   79 year old gentleman with a history of mitral valve repair.  He has postoperative atrial fibrillation.  He developed a large pericardial effusion and had pericardial drainage with chest tube yesterday.   He is developed some edema of his hands and ankles/lower legs.  We stopped the amio drip yesterday ,  Changed him to PO amio Started lasix  Net negative 2.4 liters this admission  BP is a bit low He has not been eating or drinking very well  Hb is 7.8   Still in Afib,  Will try  To get him scheduled for cardioversion on Thursday if his BP is better.    Inpatient Medications    Scheduled Meds: . amiodarone  200 mg Oral Daily  . bisacodyl  10 mg Oral Daily   Or  . bisacodyl  10 mg Rectal Daily  . Chlorhexidine Gluconate Cloth  6 each Topical Daily  . digoxin  0.125 mg Oral Daily  . docusate sodium  200 mg Oral Daily  . ferrous [id number] C-folic acid  1 capsule Oral Q breakfast  . furosemide  40 mg Intravenous BID  . levothyroxine  25 mcg Oral Q0600  . mouth rinse  15 mL Mouth Rinse BID  . midodrine  2.5 mg Oral BID WC  . montelukast  10 mg Oral QHS  . pantoprazole  40 mg Oral QHS  . potassium chloride  10 mEq Oral BID  . simvastatin  10 mg Oral QHS  . warfarin  1 mg Oral q1600  . Warfarin - Physician Dosing Inpatient   Does not apply q1600   Continuous Infusions: . cefUROXime (ZINACEF)  IV     PRN Meds: albuterol, benzonatate, metoprolol tartrate, ondansetron (ZOFRAN) IV, oxyCODONE, traMADol   Vital Signs    Vitals:   03/24/20 0400 03/24/20 0500 03/24/20 0600 03/24/20 0744  BP: 109/63 95/61 131/75   Pulse: 87 91 96   Resp: 13 18 15    Temp: 97.9 F (36.6 C)   97.9 F (36.6 C)  TempSrc: Oral     SpO2: 95% 93% 92%   Weight:  92 kg    Height:        Intake/Output Summary (Last 24 hours) at 03/24/2020  0911 Last data filed at 03/24/2020 5462 Gross per 24 hour  Intake 636.07 ml  Output 2218 ml  Net -1581.93 ml   Last 3 Weights 03/24/2020 03/23/2020 03/22/2020  Weight (lbs) 202 lb 13.2 oz 205 lb 0.4 oz 202 lb 2.6 oz  Weight (kg) 92 kg 93 kg 91.7 kg      Telemetry    afib with RVR - Personally Reviewed  ECG     - Personally Reviewed  Physical Exam   Physical Exam: Blood pressure 131/75, pulse 96, temperature 97.9 F (36.6 C), resp. rate 15, height 5\' 10"  (1.778 m), weight 92 kg, SpO2 92 %.  GEN:  Elderly male,  NAD  HEENT: Normal NECK: No JVD; No carotid bruits LYMPHATICS: No lymphadenopathy CARDIAC:  Irreg.  RESPIRATORY:  Clear to auscultation without rales, wheezing or rhonchi  ABDOMEN: Soft, non-tender, non-distended MUSCULOSKELETAL:  Hands and feet have 2+ edema  SKIN: Warm and dry NEUROLOGIC:  Alert and oriented x 3   Labs    High Sensitivity Troponin:  No results for input(s): TROPONINIHS in the last 720 hours.    Chemistry Recent Labs  Lab  03/21/20 1407 03/22/20 0111 03/23/20 0355 03/24/20 0436  NA  --  134* 132* 132*  K  --  3.9 4.4 4.1  CL  --  97* 98 95*  CO2  --  26 27 27   GLUCOSE  --  113* 145* 92  BUN  --  38* 31* 30*  CREATININE  --  1.66* 1.63* 1.47*  CALCIUM  --  8.0* 7.7* 7.7*  PROT  --   --  4.9*  --   ALBUMIN 2.1*  --  2.0*  --   AST  --   --  35  --   ALT  --   --  32  --   ALKPHOS  --   --  62  --   BILITOT  --   --  0.7  --   GFRNONAA  --  41* 42* 48*  ANIONGAP  --  11 7 10      Hematology Recent Labs  Lab 03/22/20 0111 03/23/20 0355 03/24/20 0436  WBC 15.7* 16.6* 14.8*  RBC 2.76* 2.84* 2.63*  HGB 7.9* 8.2* 7.8*  HCT 24.6* 25.8* 23.8*  MCV 89.1 90.8 90.5  MCH 28.6 28.9 29.7  MCHC 32.1 31.8 32.8  RDW 14.6 14.6 14.6  PLT 281 296 319    BNPNo results for input(s): BNP, PROBNP in the last 168 hours.   DDimer No results for input(s): DDIMER in the last 168 hours.   Radiology    DG Chest Port 1 View  Result  Date: 03/24/2020 CLINICAL DATA:  Chest tube.  Pericardial effusion. EXAM: PORTABLE CHEST 1 VIEW COMPARISON:  03/23/2020. FINDINGS: Left PICC line in unchanged position with tip over right atrium. Pericardial drainage catheter stable position. Pneumopericardium cannot be excluded. Prior cardiac valve replacement. Stable cardiomegaly. Persistent prominent bibasilar atelectasis and infiltrates. Persistent small bilateral pleural effusions. No pneumothorax. IMPRESSION: 1. Left PICC line in unchanged position. Pericardial drainage catheter stable position. Pneumopericardium cannot be excluded. 2. Prior cardiac valve replacement. Stable cardiomegaly. 3. Persistent prominent bibasilar atelectasis and infiltrates/edema. Persistent small bilateral pleural effusions. Electronically Signed   By: Marcello Moores  Register   On: 03/24/2020 07:02   DG Chest Port 1 View  Result Date: 03/23/2020 CLINICAL DATA:  Chest tube.  Mitral valve replacement. EXAM: PORTABLE CHEST 1 VIEW COMPARISON:  03/22/2020. FINDINGS: Left PICC line stable position. Pericardial drainage catheter stable position. Prior cardiac valve replacement. Cardiomegaly. Persistent bibasilar atelectasis. Progressive bibasilar infiltrates/edema and bilateral pleural effusions. Findings suggest CHF. IMPRESSION: 1. Left PICC line stable position. Pericardial drainage catheter stable position. 2. Prior cardiac valve replacement. Cardiomegaly with progressive bibasilar infiltrates/edema and bilateral pleural effusions. Findings suggest CHF. 3. Persistent bibasilar atelectasis. Electronically Signed   By: Marcello Moores  Register   On: 03/23/2020 06:31   DG CHEST PORT 1 VIEW  Result Date: 03/22/2020 CLINICAL DATA:  Status post pericardial surgery. EXAM: PORTABLE CHEST 1 VIEW COMPARISON:  March 21, 2020 FINDINGS: Minimal hazy opacity in the right base is favored represent atelectasis. The left PICC line terminates in the central SVC, stable. Opacity in the left base is likely  effusion and atelectasis. No pneumothorax. No change in the cardiomediastinal silhouette. IMPRESSION: 1. No interval change in left PICC line or left pleural effusion with underlying atelectasis. No pneumothorax. 2. Minimal hazy opacity in the right base is favored represent atelectasis. 3. No other changes. Electronically Signed   By: Dorise Bullion III M.D   On: 03/22/2020 11:37    Cardiac Studies      Patient Profile  79 y.o. male with recent mitral valve repair , atrial fib   Assessment & Plan    1.  Mitral valve repair: Stable.  Still having difficulty getting over his surgery  He is hypotensive   2.  Atrial fib:  On PO amio now .  3.  Pericardial effusion / pericardial tamponade:   S/p drainage of pericardial fluid by Dr. Servando Snare yesterday   4.   Edema: BP is low  Will hold lasix and kdur .      For questions or updates, please contact [place] Please consult [email or web address] for contact info under        Signed, Mertie Moores, MD  03/24/2020, 9:11 AM

## 2020-03-24 NOTE — Plan of Care (Signed)

## 2020-03-24 NOTE — Progress Notes (Signed)
EVENING ROUNDS NOTE :     [address]       [place] [address]             [phone]                 2 Days Post-Op Procedure(s) (LRB): SUBXYPHOID POST-OP PERICARDIAL FLUID DRAINAGE WITH CHEST TUBE INSERTION. (N/A) CHEST TUBE INSERTION OF 28 BLAKE DRAIN. (N/A) TRANSESOPHAGEAL ECHOCARDIOGRAM (TEE) (N/A)  Total Length of Stay:  LOS: 13 days  BP (!) 89/69   Pulse 77   Temp 97.8 F (36.6 C) (Oral)   Resp (!) 25   Ht 5\' 10"  (1.778 m)   Wt 92 kg   SpO2 94%   BMI 29.10 kg/m   .Intake/Output      12/20 0701 12/21 0700 12/21 0701 12/22 0700   P.O. 900 950   I.V. (mL/kg) 69.4 (0.8)    IV Piggyback     Total Intake(mL/kg) 969.4 (10.5) 950 (10.3)   Urine (mL/kg/hr) 2082 (0.9) 800 (1)   Blood     Chest Tube 136 0   Total Output 2218 800   Net -1248.6 +150          . cefUROXime (ZINACEF)  IV       Lab Results  Component Value Date   WBC 14.8 (H) 03/24/2020   HGB 8.5 (L) 03/24/2020   HCT 25.3 (L) 03/24/2020   PLT 319 03/24/2020   GLUCOSE 92 03/24/2020   CHOL 184 11/14/2019   TRIG 87 11/14/2019   HDL 70 11/14/2019   LDLCALC 98 11/14/2019   ALT 32 03/23/2020   AST 35 03/23/2020   NA 132 (L) 03/24/2020   K 4.1 03/24/2020   CL 95 (L) 03/24/2020   CREATININE 1.47 (H) 03/24/2020   BUN 30 (H) 03/24/2020   CO2 27 03/24/2020   TSH 6.518 (H) 03/22/2020   PSA 2.4 07/29/2014   INR 2.3 (H) 03/24/2020   HGBA1C 5.4 03/09/2020   Still a fib Borderline bp , midodrine dose increased today    Grace Isaac MD  Beeper [phone] Office [phone] 03/24/2020 3:51 PM

## 2020-03-24 NOTE — Progress Notes (Addendum)
TCTS DAILY ICU PROGRESS NOTE                   [address]            [place] [address]          [phone]   2 Days Post-Op Procedure(s) (LRB): SUBXYPHOID POST-OP PERICARDIAL FLUID DRAINAGE WITH CHEST TUBE INSERTION. (N/A) CHEST TUBE INSERTION OF 28 BLAKE DRAIN. (N/A) TRANSESOPHAGEAL ECHOCARDIOGRAM (TEE) (N/A)  Total Length of Stay:  LOS: 13 days   Subjective: Feels okay today. He walked this morning and feels good about his progress.   Objective: Vital signs in last 24 hours: Temp:  [97.4 F (36.3 C)-97.9 F (36.6 C)] 97.9 F (36.6 C) (12/21 0744) Pulse Rate:  [82-111] 96 (12/21 0600) Cardiac Rhythm: Atrial fibrillation (12/21 0400) Resp:  [11-23] 15 (12/21 0600) BP: (87-131)/(47-86) 131/75 (12/21 0600) SpO2:  [77 %-100 %] 92 % (12/21 0600) Weight:  [92 kg] 92 kg (12/21 0500)  Filed Weights   03/22/20 0527 03/23/20 0600 03/24/20 0500  Weight: 91.7 kg 93 kg 92 kg    Weight change: -1 kg   Hemodynamic parameters for last 24 hours:    Intake/Output from previous day: 12/20 0701 - 12/21 0700 In: 969.4 [P.O.:900; I.V.:69.4] Out: 2218 [Urine:2082; Chest Tube:136]  Intake/Output this shift: No intake/output data recorded.  Current Meds: Scheduled Meds: . amiodarone  200 mg Oral Daily  . bisacodyl  10 mg Oral Daily   Or  . bisacodyl  10 mg Rectal Daily  . Chlorhexidine Gluconate Cloth  6 each Topical Daily  . digoxin  0.125 mg Oral Daily  . docusate sodium  200 mg Oral Daily  . ferrous [id number] C-folic acid  1 capsule Oral Q breakfast  . furosemide  40 mg Intravenous BID  . levothyroxine  25 mcg Oral Q0600  . mouth rinse  15 mL Mouth Rinse BID  . midodrine  2.5 mg Oral BID WC  . montelukast  10 mg Oral QHS  . pantoprazole  40 mg Oral QHS  . potassium chloride  10 mEq Oral BID  . simvastatin  10 mg Oral QHS  . Warfarin - Physician Dosing Inpatient   Does not apply q1600   Continuous Infusions: . cefUROXime (ZINACEF)  IV      PRN Meds:.albuterol, benzonatate, metoprolol tartrate, ondansetron (ZOFRAN) IV, oxyCODONE, traMADol  General appearance: alert, cooperative and no distress Heart: sinus tachycardia Lungs: clear to auscultation bilaterally Abdomen: soft, non-tender; bowel sounds normal; no masses,  no organomegaly and hypoactive bowel sounds Extremities: extremities normal, atraumatic, no cyanosis or edema Wound: clean and dry  Lab Results: CBC: Recent Labs    03/23/20 0355 03/24/20 0436  WBC 16.6* 14.8*  HGB 8.2* 7.8*  HCT 25.8* 23.8*  PLT 296 319   BMET:  Recent Labs    03/23/20 0355 03/24/20 0436  NA 132* 132*  K 4.4 4.1  CL 98 95*  CO2 27 27  GLUCOSE 145* 92  BUN 31* 30*  CREATININE 1.63* 1.47*  CALCIUM 7.7* 7.7*    CMET: Lab Results  Component Value Date   WBC 14.8 (H) 03/24/2020   HGB 7.8 (L) 03/24/2020   HCT 23.8 (L) 03/24/2020   PLT 319 03/24/2020   GLUCOSE 92 03/24/2020   CHOL 184 11/14/2019   TRIG 87 11/14/2019   HDL 70 11/14/2019   LDLCALC 98 11/14/2019   ALT 32 03/23/2020   AST 35 03/23/2020   NA 132 (L) 03/24/2020  K 4.1 03/24/2020   CL 95 (L) 03/24/2020   CREATININE 1.47 (H) 03/24/2020   BUN 30 (H) 03/24/2020   CO2 27 03/24/2020   TSH 6.518 (H) 03/22/2020   PSA 2.4 07/29/2014   INR 2.3 (H) 03/24/2020   HGBA1C 5.4 03/09/2020      PT/INR:  Recent Labs    03/24/20 0436  LABPROT 24.6*  INR 2.3*   Radiology: [facility] Chest Port 1 View  Result Date: 03/24/2020 CLINICAL DATA:  Chest tube.  Pericardial effusion. EXAM: PORTABLE CHEST 1 VIEW COMPARISON:  03/23/2020. FINDINGS: Left PICC line in unchanged position with tip over right atrium. Pericardial drainage catheter stable position. Pneumopericardium cannot be excluded. Prior cardiac valve replacement. Stable cardiomegaly. Persistent prominent bibasilar atelectasis and infiltrates. Persistent small bilateral pleural effusions. No pneumothorax. IMPRESSION: 1. Left PICC line in unchanged position. Pericardial  drainage catheter stable position. Pneumopericardium cannot be excluded. 2. Prior cardiac valve replacement. Stable cardiomegaly. 3. Persistent prominent bibasilar atelectasis and infiltrates/edema. Persistent small bilateral pleural effusions. Electronically Signed   By: Marcello Moores  Register   On: 03/24/2020 07:02     Assessment/Plan: S/P Procedure(s) (LRB): SUBXYPHOID POST-OP PERICARDIAL FLUID DRAINAGE WITH CHEST TUBE INSERTION. (N/A) CHEST TUBE INSERTION OF 28 BLAKE DRAIN. (N/A) TRANSESOPHAGEAL ECHOCARDIOGRAM (TEE) (N/A)  1. CV-Afib/sinus tachycardia. On Amio PO and Digoxin. INR 2.3 with coumadin held yesterday. Will order 1mg  for today.  2. Pulm-persistent bibasilar atelectasis and infiltrates/edema. Persistent small bilateral pleural effusions. 3. Renal- creatinine decreased to 1.47, continue lasix IV BID. 4. H and H 7.8/23.8, will order repeat H and H in a few hours. Small amount of bloody urine in his catheter. Pericardial drain output remains low. 5. Hypothyroidism-on low-dose synthroid.  6. Constipation- on stool softeners.   Plan: Continue to wean oxygen as able. Not on any drips. Might be able to transfer to the cardiac telemetry unit today. Low-dose coumadin ordered for tonight.      Elgie Collard 03/24/2020 8:00 AM  Cr slightly lower today  D/c foley- placed at time of recent surgery Adjust coumadin dose  Diet supplements- not getting - reorder Moderate mal nutritional  Still with significant edema afib remains - consider cardioversion -cardiology I have seen and examined Patrick Bell and agree with the above assessment  and plan.  Grace Isaac MD Beeper [phone] Office [phone] 03/24/2020 9:46 AM

## 2020-03-25 ENCOUNTER — Inpatient Hospital Stay (HOSPITAL_COMMUNITY): Payer: Medicare Other

## 2020-03-25 LAB — BASIC METABOLIC PANEL
Anion gap: 8 (ref 5–15)
BUN: 25 mg/dL — ABNORMAL HIGH (ref 8–23)
CO2: 26 mmol/L (ref 22–32)
Calcium: 7.4 mg/dL — ABNORMAL LOW (ref 8.9–10.3)
Chloride: 99 mmol/L (ref 98–111)
Creatinine, Ser: 1.39 mg/dL — ABNORMAL HIGH (ref 0.61–1.24)
GFR, Estimated: 51 mL/min — ABNORMAL LOW (ref >60)
Glucose, Bld: 102 mg/dL — ABNORMAL HIGH (ref 70–99)
Potassium: 3.6 mmol/L (ref 3.5–5.1)
Sodium: 133 mmol/L — ABNORMAL LOW (ref 135–145)

## 2020-03-25 LAB — CBC
HCT: 21.1 % — ABNORMAL LOW (ref 39.0–52.0)
Hemoglobin: 7.1 g/dL — ABNORMAL LOW (ref 13.0–17.0)
MCH: 30.1 pg (ref 26.0–34.0)
MCHC: 33.6 g/dL (ref 30.0–36.0)
MCV: 89.4 fL (ref 80.0–100.0)
Platelets: 311 10*3/uL (ref 150–400)
RBC: 2.36 MIL/uL — ABNORMAL LOW (ref 4.22–5.81)
RDW: 15 % (ref 11.5–15.5)
WBC: 13.1 10*3/uL — ABNORMAL HIGH (ref 4.0–10.5)
nRBC: 0 % (ref 0.0–0.2)

## 2020-03-25 LAB — BODY FLUID CULTURE: Culture: NO GROWTH

## 2020-03-25 LAB — PROTIME-INR
INR: 1.7 — ABNORMAL HIGH (ref 0.8–1.2)
Prothrombin Time: 19 seconds — ABNORMAL HIGH (ref 11.4–15.2)

## 2020-03-25 LAB — PREPARE RBC (CROSSMATCH)

## 2020-03-25 MED ORDER — WARFARIN SODIUM 2 MG PO TABS: 2.0000 mg | ORAL_TABLET | Freq: Every day | ORAL | Status: DC

## 2020-03-25 MED ORDER — FUROSEMIDE 40 MG PO TABS
40.0000 mg | ORAL_TABLET | Freq: Two times a day (BID) | ORAL | Status: DC
  Administered 2020-03-25 – 2020-03-28 (×8): 40 mg via ORAL
  Filled 2020-03-25 (×8): qty 1

## 2020-03-25 MED ORDER — POTASSIUM CHLORIDE CRYS ER 20 MEQ PO TBCR
20.0000 meq | EXTENDED_RELEASE_TABLET | Freq: Two times a day (BID) | ORAL | Status: DC
  Administered 2020-03-26 – 2020-03-31 (×11): 20 meq via ORAL
  Filled 2020-03-25 (×11): qty 1

## 2020-03-25 MED ORDER — POTASSIUM CHLORIDE CRYS ER 20 MEQ PO TBCR
20.0000 meq | EXTENDED_RELEASE_TABLET | Freq: Two times a day (BID) | ORAL | Status: AC
  Administered 2020-03-25 (×2): 20 meq via ORAL
  Filled 2020-03-25 (×2): qty 1

## 2020-03-25 MED ORDER — WARFARIN SODIUM 1 MG PO TABS
1.0000 mg | ORAL_TABLET | Freq: Every day | ORAL | Status: DC
  Administered 2020-03-25: 16:00:00 1 mg via ORAL
  Filled 2020-03-25: qty 1

## 2020-03-25 MED ORDER — SODIUM CHLORIDE 0.9% IV SOLUTION: Freq: Once | INTRAVENOUS | Status: AC

## 2020-03-25 NOTE — Progress Notes (Signed)
EVENING ROUNDS NOTE :     [address]       [place] [address]             [phone]                 3 Days Post-Op Procedure(s) (LRB): SUBXYPHOID POST-OP PERICARDIAL FLUID DRAINAGE WITH CHEST TUBE INSERTION. (N/A) CHEST TUBE INSERTION OF 28 BLAKE DRAIN. (N/A) TRANSESOPHAGEAL ECHOCARDIOGRAM (TEE) (N/A)   Total Length of Stay:  LOS: 14 days  Events:   No events  Ambulated with PT    BP (!) 125/59   Pulse 77   Temp 97.7 F (36.5 C) (Oral)   Resp 19   Ht 5\' 10"  (1.778 m)   Wt 91.6 kg   SpO2 96%   BMI 28.98 kg/m         . cefUROXime (ZINACEF)  IV      I/O last 3 completed shifts: In: 8338 [P.O.:3610] Out: 2668 [Urine:2602; Chest Tube:66]   CBC Latest Ref Rng & Units 03/25/2020 03/24/2020 03/24/2020  WBC 4.0 - 10.5 K/uL 13.1(H) - 14.8(H)  Hemoglobin 13.0 - 17.0 g/dL 7.1(L) 8.5(L) 7.8(L)  Hematocrit 39.0 - 52.0 % 21.1(L) 25.3(L) 23.8(L)  Platelets 150 - 400 K/uL 311 - 319    BMP Latest Ref Rng & Units 03/25/2020 03/24/2020 03/23/2020  Glucose 70 - 99 mg/dL 102(H) 92 145(H)  BUN 8 - 23 mg/dL 25(H) 30(H) 31(H)  Creatinine 0.61 - 1.24 mg/dL 1.39(H) 1.47(H) 1.63(H)  BUN/Creat Ratio 10 - 24 - - -  Sodium 135 - 145 mmol/L 133(L) 132(L) 132(L)  Potassium 3.5 - 5.1 mmol/L 3.6 4.1 4.4  Chloride 98 - 111 mmol/L 99 95(L) 98  CO2 22 - 32 mmol/L 26 27 27   Calcium 8.9 - 10.3 mg/dL 7.4(L) 7.7(L) 7.7(L)    ABG    Component Value Date/Time   PHART 7.373 03/12/2020 1034   PCO2ART 44.6 03/12/2020 1034   PO2ART 195 (H) 03/12/2020 1034   HCO3 26.1 03/12/2020 1034   TCO2 23 03/12/2020 1658   ACIDBASEDEF 2.0 03/12/2020 0909   O2SAT 68.9 03/15/2020 0455       Melodie Bouillon, MD 03/25/2020 5:53 PM

## 2020-03-25 NOTE — Progress Notes (Signed)
TCTS DAILY ICU PROGRESS NOTE                   [address]            [place] [id number]          [phone]   3 Days Post-Op Procedure(s) (LRB): SUBXYPHOID POST-OP PERICARDIAL FLUID DRAINAGE WITH CHEST TUBE INSERTION. (N/A) CHEST TUBE INSERTION OF 28 BLAKE DRAIN. (N/A) TRANSESOPHAGEAL ECHOCARDIOGRAM (TEE) (N/A)  Total Length of Stay:  LOS: 14 days   Subjective: Feels okay this morning. Did have a bowel movement yesterday  Objective: Vital signs in last 24 hours: Temp:  [97.4 F (36.3 C)-98.5 F (36.9 C)] 98 F (36.7 C) (12/22 0756) Pulse Rate:  [69-114] 74 (12/22 0400) Cardiac Rhythm: Atrial fibrillation (12/22 0400) Resp:  [10-25] 17 (12/22 0400) BP: (85-152)/(50-75) 125/55 (12/22 0400) SpO2:  [74 %-98 %] 96 % (12/22 0400) Weight:  [91.6 kg] 91.6 kg (12/22 0500)  Filed Weights   03/23/20 0600 03/24/20 0500 03/25/20 0500  Weight: 93 kg 92 kg 91.6 kg    Weight change: -0.4 kg     Intake/Output from previous day: 12/21 0701 - 12/22 0700 In: 3610 [P.O.:3610] Out: 1760 [Urine:1760]  Intake/Output this shift: No intake/output data recorded.  Current Meds: Scheduled Meds: . amiodarone  200 mg Oral Daily  . bisacodyl  10 mg Oral Daily   Or  . bisacodyl  10 mg Rectal Daily  . Chlorhexidine Gluconate Cloth  6 each Topical Daily  . digoxin  0.125 mg Oral Daily  . docusate sodium  200 mg Oral Daily  . feeding supplement  237 mL Oral TID  . ferrous fumarate-b12-vitamic C-folic acid  1 capsule Oral Q breakfast  . levothyroxine  25 mcg Oral Q0600  . mouth rinse  15 mL Mouth Rinse BID  . midodrine  5 mg Oral BID WC  . montelukast  10 mg Oral QHS  . pantoprazole  40 mg Oral QHS  . simvastatin  10 mg Oral QHS  . warfarin  1 mg Oral q1600  . Warfarin - Physician Dosing Inpatient   Does not apply q1600   Continuous Infusions: . cefUROXime (ZINACEF)  IV     PRN Meds:.albuterol, benzonatate, metoprolol tartrate, ondansetron (ZOFRAN) IV, oxyCODONE,  traMADol  General appearance: alert, cooperative and no distress Heart: regular rate and rhythm, S1, S2 normal, no murmur, click, rub or gallop Lungs: clear to auscultation bilaterally Abdomen: soft, non-tender; bowel sounds normal; no masses,  no organomegaly Extremities: extremities normal, atraumatic, no cyanosis or edema Wound: clean and dry  Lab Results: CBC: Recent Labs    03/24/20 0436 03/24/20 1235 03/25/20 0327  WBC 14.8*  --  13.1*  HGB 7.8* 8.5* 7.1*  HCT 23.8* 25.3* 21.1*  PLT 319  --  311   BMET:  Recent Labs    03/24/20 0436 03/25/20 0327  NA 132* 133*  K 4.1 3.6  CL 95* 99  CO2 27 26  GLUCOSE 92 102*  BUN 30* 25*  CREATININE 1.47* 1.39*  CALCIUM 7.7* 7.4*    CMET: Lab Results  Component Value Date   WBC 13.1 (H) 03/25/2020   HGB 7.1 (L) 03/25/2020   HCT 21.1 (L) 03/25/2020   PLT 311 03/25/2020   GLUCOSE 102 (H) 03/25/2020   CHOL 184 11/14/2019   TRIG 87 11/14/2019   HDL 70 11/14/2019   LDLCALC 98 11/14/2019   ALT 32 03/23/2020   AST 35 03/23/2020   NA  133 (L) 03/25/2020   K 3.6 03/25/2020   CL 99 03/25/2020   CREATININE 1.39 (H) 03/25/2020   BUN 25 (H) 03/25/2020   CO2 26 03/25/2020   TSH 6.518 (H) 03/22/2020   PSA 2.4 07/29/2014   INR 1.7 (H) 03/25/2020   HGBA1C 5.4 03/09/2020      PT/INR:  Recent Labs    03/25/20 0327  LABPROT 19.0*  INR 1.7*   Radiology: No results found.   Assessment/Plan: S/P Procedure(s) (LRB): SUBXYPHOID POST-OP PERICARDIAL FLUID DRAINAGE WITH CHEST TUBE INSERTION. (N/A) CHEST TUBE INSERTION OF 28 BLAKE DRAIN. (N/A) TRANSESOPHAGEAL ECHOCARDIOGRAM (TEE) (N/A)  1. CV-NSR this morning. Hx chronic atrial fibrillation.  BP better after midodrine increase. Continue Amio PO and Digoxin. INR down to 1.7. Will order 2mg  coumadin.  2. Pulm-CXR stable with small left pleural effusion. 3. Renal-creatinine trending down at 1.39. Will order potassium replacement 4. H and H 7.1/21.1 which dropped from yesterday.  He did have some bloody urine from his foley but not much.   5.  Endo-blood glucose well controlled.   6. Constipation- resolved with bowel movement yesterday  Plan: Re-consult CIR. Will give 1 unit of pRBC and re-draw labs tomorrow. Re-start lasix 40mg  BID with potassium supplementation. Stable to transfer to the floor today.     Elgie Collard 03/25/2020 8:00 AM

## 2020-03-25 NOTE — Progress Notes (Signed)
CARDIAC REHAB PHASE I   PRE:  Rate/Rhythm: 77 SR  BP:  Supine: 110/52  Sitting:   Standing:    SaO2: 96%RA  MODE:  Ambulation: 190 ft   POST:  Rate/Rhythm: 90 SR  BP:  Supine:   Sitting: 135/70  Standing:    SaO2: 99%RA 1500-1525 Pt walked 190 ft on RA with rolling walker and asst x 1. Wife followed with recliner but pt did not need to sit. Took a couple of standing rest breaks. Reinforced sternal precautions and encouraged pt to stand upright. Back to bed after walk.    Graylon Good, RN BSN  03/25/2020 3:23 PM

## 2020-03-25 NOTE — Progress Notes (Signed)
Physical Therapy Treatment Patient Details Name: Patrick Bell MRN: [id number] DOB: 1941/03/20 Today's Date: 03/25/2020    History of Present Illness Patient is a 79 y/o male who presents s/p MVP and repair of acute ascending thoracic aortic dissection 12/8. Pericardial fluid drainage with chest tube placement on 12/19. PMH includes HTN.    PT Comments    Pt admitted with above diagnosis. Pt was able to ambulate with min guard to min assist with RW in hallway and incr distance. Needs cues to purse lip breathe as well as to sequence steps and RW.  Progressing well overall.   Pt currently with functional limitations due to balance and endurance deficits. Pt will benefit from skilled PT to increase their independence and safety with mobility to allow discharge to the venue listed below.     Follow Up Recommendations  CIR;Supervision for mobility/OOB;Supervision/Assistance - 24 hour     Equipment Recommendations  Rolling walker with 5" wheels    Recommendations for Other Services Rehab consult     Precautions / Restrictions Precautions Precautions: Sternal;Fall Precaution Booklet Issued: Yes (comment) Precaution Comments: reviewed "move in the tube" and no pushing/pulling with UEs, Restrictions Other Position/Activity Restrictions: Sternal precautions    Mobility  Bed Mobility Overal bed mobility: Needs Assistance     Sidelying to sit: Min assist;HOB elevated       General bed mobility comments: in chair  Transfers Overall transfer level: Needs assistance Equipment used: Rolling walker (2 wheeled) Transfers: Sit to/from Stand Sit to Stand: Min assist         General transfer comment: Cues for hand placement on B knees to push into standing.  Pt frequently attempting to reach outside "the tube"  Ambulation/Gait Ambulation/Gait assistance: Min assist;+2 safety/equipment Gait Distance (Feet): 165 Feet Assistive device: Rolling walker (2 wheeled) Gait  Pattern/deviations: Decreased stride length;Trunk flexed   Gait velocity interpretation: <1.31 ft/sec, indicative of household ambulator General Gait Details: cued pt constantly to keep trunk upright as he tends to flex at trunk.  Pt progressed ambulation and VSS.   Stairs             Wheelchair Mobility    Modified Rankin (Stroke Patients Only)       Balance Overall balance assessment: Needs assistance Sitting-balance support: Feet supported;No upper extremity supported Sitting balance-Leahy Scale: Fair Sitting balance - Comments: Supervision for safety, prefers to lean on RW or legs for support.   Standing balance support: Bilateral upper extremity supported Standing balance-Leahy Scale: Poor Standing balance comment: Requires UE support in standing.                            Cognition Arousal/Alertness: Awake/alert Behavior During Therapy: WFL for tasks assessed/performed Overall Cognitive Status: Within Functional Limits for tasks assessed                                 General Comments: appears [facility] for basic mobility tasks.      Exercises Other Exercises Other Exercises: IS x 10 reps quality 750 ml- 833ml    General Comments General comments (skin integrity, edema, etc.): Pt on RA with VSS      Pertinent Vitals/Pain Pain Assessment: Faces Faces Pain Scale: Hurts little more Pain Location: low back Pain Descriptors / Indicators: Aching;Constant Pain Intervention(s): Limited activity within patient's tolerance;Monitored during session;Repositioned    Home Living  Prior Function            PT Goals (current goals can now be found in the care plan section) Acute Rehab PT Goals Patient Stated Goal: I want to be able to walk in the woods again. Progress towards PT goals: Progressing toward goals    Frequency    Min 3X/week      PT Plan Current plan remains appropriate    Co-evaluation               AM-PAC PT "6 Clicks" Mobility   Outcome Measure  Help needed turning from your back to your side while in a flat bed without using bedrails?: A Little Help needed moving from lying on your back to sitting on the side of a flat bed without using bedrails?: A Little Help needed moving to and from a bed to a chair (including a wheelchair)?: A Little Help needed standing up from a chair using your arms (e.g., wheelchair or bedside chair)?: A Little Help needed to walk in hospital room?: A Little Help needed climbing 3-5 steps with a railing? : A Little 6 Click Score: 18    End of Session Equipment Utilized During Treatment: Gait belt Activity Tolerance: Patient limited by fatigue Patient left: with call bell/phone within reach;with family/visitor present;in chair;with chair alarm set Nurse Communication: Mobility status PT Visit Diagnosis: Muscle weakness (generalized) (M62.81);Unsteadiness on feet (R26.81);Difficulty in walking, not elsewhere classified (R26.2);Dizziness and giddiness (R42)     Time: [id number] PT Time Calculation (min) (ACUTE ONLY): 19 min  Charges:  $Gait Training: 8-22 mins                     Taj Nevins W,PT [place] Pager:  [phone]  Office:  [place] 03/25/2020, 1:37 PM

## 2020-03-25 NOTE — Progress Notes (Addendum)
Inpatient Rehab Admissions Coordinator:   Faxed updated clinicals to [facility] Medicare for appeal, per request.  Continue to wait a determination for CIR auth.   1500: met with pt and spouse at bedside to update. Will continue to follow.   Shann Medal, PT, DPT Admissions Coordinator [phone] 03/25/20  1:57 PM

## 2020-03-25 NOTE — Progress Notes (Signed)
Occupational Therapy Treatment Patient Details Name: Patrick Bell MRN: [id number] DOB: 08/02/1940 Today's Date: 03/25/2020    History of present illness Patient is a 79 y/o male who presents s/p MVP and repair of acute ascending thoracic aortic dissection 12/8. Pericardial fluid drainage with chest tube placement on 12/19. PMH includes HTN.   OT comments  Patient with continued motivation to work with all therapies and ICU staff.  He is making steady progress with all goal areas, but continues with all deficits listed below, which impact functional independence compared to his prior level of function.  He sat edge of bed to complete upper body and lower body ADL to his knees.  He continues to need near Dep care from his knees down to his feet.  CIR rehab, and the use of compensatory strategies will be necessary to help his regain his independence and return home and his max functional potential.  OT helped patient order hip kit, and will look to progress him further in the acute setting.    Follow Up Recommendations  CIR    Equipment Recommendations  3 in 1 bedside commode    Recommendations for Other Services Rehab consult    Precautions / Restrictions Precautions Precautions: Sternal;Fall Restrictions Weight Bearing Restrictions: Yes       Mobility Bed Mobility Overal bed mobility: Needs Assistance     Sidelying to sit: Min assist;HOB elevated          Transfers Overall transfer level: Needs assistance Equipment used: Rolling walker (2 wheeled) Transfers: Sit to/from Stand Sit to Stand: Min assist              Balance   Sitting-balance support: Feet supported;No upper extremity supported Sitting balance-Leahy Scale: Fair     Standing balance support: Bilateral upper extremity supported Standing balance-Leahy Scale: Zero Standing balance comment: Requires UE support in standing.                           ADL either performed or assessed with  clinical judgement   ADL           Upper Body Bathing: Minimal assistance;Sitting   Lower Body Bathing: Moderate assistance;Sit to/from stand   Upper Body Dressing : Minimal assistance;Sitting   Lower Body Dressing: Maximal assistance;Sit to/from stand               Functional mobility during ADLs: Minimal assistance;Rolling walker       Vision       Perception     Praxis      Cognition Arousal/Alertness: Awake/alert Behavior During Therapy: WFL for tasks assessed/performed Overall Cognitive Status: Within Functional Limits for tasks assessed                                                      General Comments      Pertinent Vitals/ Pain       Faces Pain Scale: Hurts little more Pain Location: low back Pain Descriptors / Indicators: Aching;Constant Pain Intervention(s): Monitored during session  Frequency  Min 2X/week        Progress Toward Goals  OT Goals(current goals can now be found in the care plan section)     Acute Rehab OT Goals Patient Stated Goal: I want to be able to walk in the woods again. OT Goal Formulation: With patient Time For Goal Achievement: 03/31/20 Potential to Achieve Goals: Good  Plan      Co-evaluation                 AM-PAC OT "6 Clicks" Daily Activity     Outcome Measure   Help from another person eating meals?: None Help from another person taking care of personal grooming?: A Little Help from another person toileting, which includes using toliet, bedpan, or urinal?: A Little Help from another person bathing (including washing, rinsing, drying)?: A Lot Help from another person to put on and taking off regular upper body clothing?: A Little Help from another person to put on and taking off regular lower body clothing?: A Lot 6 Click Score: 17    End of Session Equipment Utilized During Treatment:  Rolling walker  OT Visit Diagnosis: Other abnormalities of gait and mobility (R26.89);Muscle weakness (generalized) (M62.81)   Activity Tolerance Patient tolerated treatment well   Patient Left in chair;with call bell/phone within reach;with nursing/sitter in room;with family/visitor present   Nurse Communication          Time: 1000-1032 OT Time Calculation (min): 32 min  Charges: OT General Charges $OT Visit: 1 Visit OT Treatments $Self Care/Home Management : 23-37 mins  03/25/2020  Rich, OTR/L  Acute Rehabilitation Services  Office:  [phone]    Metta Clines 03/25/2020, 10:52 AM

## 2020-03-25 NOTE — Op Note (Signed)
NAME: KALE, RONDEAU MEDICAL RECORD [id number] ACCOUNT [id number] DATE OF BIRTH:February 23, 1941 FACILITY: [facility] LOCATION: [facility] PHYSICIAN:Vantasia Pinkney Maryruth Bun, MD  OPERATIVE REPORT  DATE OF PROCEDURE:  03/22/2020  PREOPERATIVE DIAGNOSIS:  Postoperative pericardial effusion.  POSTOPERATIVE DIAGNOSIS:  Postoperative pericardial effusion.  SURGICAL PROCEDURE:  Drainage of postoperative pericardial effusion with subxiphoid approach and TEE.  SURGEON:  Lanelle Bal, MD  FIRST ASSISTANT:  Miki Kins, RNFA.  BRIEF HISTORY:  The patient is an 79 year old male who 10 days prior had undergone a mitral valve repair.  At the time of his repair, he _____ secondary to the femoral cannulation and acute aortic dissection, which was repaired simultaneously.  The  patient had been making reasonable progress postoperatively, neurologically intact.  Renal function was slightly deteriorated with a creatinine of 1.6-1.8.  Two days before the procedure, he developed atrial fibrillation, had been increasingly fatigued  with activity and shortness of breath and with limited blood pressure to tolerate medications to treat atrial fib.  Thus, for evaluation of this, postoperative echocardiogram was obtained, which showed a moderate sized posterior pericardial effusion.   The patient is on Coumadin.  INR was 2.6.  Due to the patient's symptoms and the moderate size of the posterior pericardial effusion, it was recommended to him that we proceed with drainage of the effusion.  He agreed and signed informed consent.  DESCRIPTION OF PROCEDURE:  The patient was brought to the operating room in fasting condition.  Arterial line was placed.  He underwent general endotracheal anesthesia without incident.  Skin of the chest was prepped with Betadine, draped in a sterile  manner.  Appropriate timeout was performed and then we proceeded with a small incision at the lower end of the sternal incision, carried down through the  subcutaneous tissue into the pericardial space.  With this, we were able to easily evacuate 200-250  mL of old bloody pericardial effusion.  There were no clots present.  On TEE, this fluid collection appeared totally resolved.  In addition, his aortic valve was functioning well as well as his mitral valve repair.  A 28 Blake drain was left in the  posterior recess of the pericardium and brought out through a separate site to the right of the incision.  Incision was then closed with interrupted 0 Vicryl, running 2-0 Vicryl, subcutaneous tissue with 3-0 subcuticular stitch in skin edges.  Dry  dressings were applied.  The patient was awakened and extubated in the operating room, transferred to the recovery room for postoperative observation.  He tolerated the procedure without obvious complication.  Sponge and needle count was reported as  correct at completion.  IN/NUANCE  D:03/24/2020 T:03/25/2020 JOB:[id number]

## 2020-03-25 NOTE — Progress Notes (Signed)
Progress Note  Patient Name: Patrick Bell Date of Encounter: 03/25/2020  [facility] Cardiologist: Shelva Majestic, MD   Subjective   79 year old gentleman with a history of mitral valve repair.  He has postoperative atrial fibrillation.  He developed a large pericardial effusion and had pericardial drainage with chest tube yesterday.   He is developed some edema of his hands and ankles/lower legs.   Hb is 7.1 -  Currently getting transfusion  BP is better. He has converted to sinus rhythm   Inpatient Medications    Scheduled Meds: . amiodarone  200 mg Oral Daily  . bisacodyl  10 mg Oral Daily   Or  . bisacodyl  10 mg Rectal Daily  . Chlorhexidine Gluconate Cloth  6 each Topical Daily  . digoxin  0.125 mg Oral Daily  . docusate sodium  200 mg Oral Daily  . feeding supplement  237 mL Oral TID  . ferrous [id number] C-folic acid  1 capsule Oral Q breakfast  . furosemide  40 mg Oral BID  . levothyroxine  25 mcg Oral Q0600  . mouth rinse  15 mL Mouth Rinse BID  . midodrine  5 mg Oral BID WC  . montelukast  10 mg Oral QHS  . pantoprazole  40 mg Oral QHS  . potassium chloride  20 mEq Oral BID  . [START ON 03/26/2020] potassium chloride  20 mEq Oral BID  . simvastatin  10 mg Oral QHS  . warfarin  1 mg Oral q1600  . Warfarin - Physician Dosing Inpatient   Does not apply q1600   Continuous Infusions: . cefUROXime (ZINACEF)  IV     PRN Meds: albuterol, benzonatate, metoprolol tartrate, ondansetron (ZOFRAN) IV, oxyCODONE, traMADol   Vital Signs    Vitals:   03/25/20 0845 03/25/20 0910 03/25/20 0915 03/25/20 0925  BP: 128/60 115/83 (!) 124/55 (!) 126/56  Pulse: 73   76  Resp: 20  16 19   Temp: 97.9 F (36.6 C)   (!) 97.5 F (36.4 C)  TempSrc: Oral   Axillary  SpO2: 96%   95%  Weight:      Height:        Intake/Output Summary (Last 24 hours) at 03/25/2020 1029 Last data filed at 03/25/2020 0900 Gross per 24 hour  Intake 3360 ml  Output 1560 ml   Net 1800 ml   Last 3 Weights 03/25/2020 03/24/2020 03/23/2020  Weight (lbs) 201 lb 15.1 oz 202 lb 13.2 oz 205 lb 0.4 oz  Weight (kg) 91.6 kg 92 kg 93 kg      Telemetry    NSR  - Personally Reviewed  ECG     - Personally Reviewed  Physical Exam   Physical Exam: Blood pressure (!) 126/56, pulse 76, temperature (!) 97.5 F (36.4 C), temperature source Axillary, resp. rate 19, height 5\' 10"  (1.778 m), weight 91.6 kg, SpO2 95 %.  GEN:  Elderly male,  Healing from surgery  HEENT: Normal NECK: No JVD; No carotid bruits LYMPHATICS: No lymphadenopathy CARDIAC:RR , soft systolic murmur  RESPIRATORY:  Clear to auscultation without rales, wheezing or rhonchi  ABDOMEN: Soft, non-tender, non-distended MUSCULOSKELETAL:  No edema; No deformity  SKIN: Warm and dry NEUROLOGIC:  Alert and oriented x 3    Labs    High Sensitivity Troponin:  No results for input(s): TROPONINIHS in the last 720 hours.    Chemistry Recent Labs  Lab 03/21/20 1407 03/22/20 0111 03/23/20 0355 03/24/20 0436 03/25/20 0327  NA  --    < >  132* 132* 133*  K  --    < > 4.4 4.1 3.6  CL  --    < > 98 95* 99  CO2  --    < > 27 27 26   GLUCOSE  --    < > 145* 92 102*  BUN  --    < > 31* 30* 25*  CREATININE  --    < > 1.63* 1.47* 1.39*  CALCIUM  --    < > 7.7* 7.7* 7.4*  PROT  --   --  4.9*  --   --   ALBUMIN 2.1*  --  2.0*  --   --   AST  --   --  35  --   --   ALT  --   --  32  --   --   ALKPHOS  --   --  62  --   --   BILITOT  --   --  0.7  --   --   GFRNONAA  --    < > 42* 48* 51*  ANIONGAP  --    < > 7 10 8    < > = values in this interval not displayed.     Hematology Recent Labs  Lab 03/23/20 0355 03/24/20 0436 03/24/20 1235 03/25/20 0327  WBC 16.6* 14.8*  --  13.1*  RBC 2.84* 2.63*  --  2.36*  HGB 8.2* 7.8* 8.5* 7.1*  HCT 25.8* 23.8* 25.3* 21.1*  MCV 90.8 90.5  --  89.4  MCH 28.9 29.7  --  30.1  MCHC 31.8 32.8  --  33.6  RDW 14.6 14.6  --  15.0  PLT 296 319  --  311    BNPNo  results for input(s): BNP, PROBNP in the last 168 hours.   DDimer No results for input(s): DDIMER in the last 168 hours.   Radiology    DG Chest Port 1 View  Result Date: 03/25/2020 CLINICAL DATA:  Follow-up chest tube EXAM: PORTABLE CHEST 1 VIEW COMPARISON:  03/24/2020 FINDINGS: Cardiac shadow is stable. Postoperative changes are again seen. Left-sided PICC line is noted in the superior right atrium stable from the prior exam. Pericardial drainage catheter is not well appreciated on this exam. Small effusions are noted left greater than right stable from the prior exam. No focal confluent infiltrate is seen. Improved aeration in the left base is noted. IMPRESSION: Small effusions bilaterally. Electronically Signed   By: Inez Catalina M.D.   On: 03/25/2020 08:10   DG Chest Port 1 View  Result Date: 03/24/2020 CLINICAL DATA:  Chest tube.  Pericardial effusion. EXAM: PORTABLE CHEST 1 VIEW COMPARISON:  03/23/2020. FINDINGS: Left PICC line in unchanged position with tip over right atrium. Pericardial drainage catheter stable position. Pneumopericardium cannot be excluded. Prior cardiac valve replacement. Stable cardiomegaly. Persistent prominent bibasilar atelectasis and infiltrates. Persistent small bilateral pleural effusions. No pneumothorax. IMPRESSION: 1. Left PICC line in unchanged position. Pericardial drainage catheter stable position. Pneumopericardium cannot be excluded. 2. Prior cardiac valve replacement. Stable cardiomegaly. 3. Persistent prominent bibasilar atelectasis and infiltrates/edema. Persistent small bilateral pleural effusions. Electronically Signed   By: Marcello Moores  Register   On: 03/24/2020 07:02    Cardiac Studies      Patient Profile     79 y.o. male with recent mitral valve repair , atrial fib   Assessment & Plan    1.  Mitral valve repair: Slow progress Getting transfusion    2.  Atrial fib:  On PO amio now, has converted to sinus rhythm  .  3.  Pericardial  effusion / pericardial tamponade:  S/p drainage   4.   Edema:      For questions or updates, please contact [place] Please consult [email or web address] for contact info under        Signed, Mertie Moores, MD  03/25/2020, 10:29 AM

## 2020-03-26 ENCOUNTER — Inpatient Hospital Stay (HOSPITAL_COMMUNITY): Payer: Medicare Other

## 2020-03-26 ENCOUNTER — Encounter (HOSPITAL_COMMUNITY)
Admission: RE | Disposition: A | Discharge status: Rehabilitation Facility | Payer: Self-pay | Source: Home / Self Care | Attending: Thoracic Surgery (Cardiothoracic Vascular Surgery)

## 2020-03-26 LAB — BASIC METABOLIC PANEL
Anion gap: 8 (ref 5–15)
BUN: 21 mg/dL (ref 8–23)
CO2: 26 mmol/L (ref 22–32)
Calcium: 8 mg/dL — ABNORMAL LOW (ref 8.9–10.3)
Chloride: 101 mmol/L (ref 98–111)
Creatinine, Ser: 1.56 mg/dL — ABNORMAL HIGH (ref 0.61–1.24)
GFR, Estimated: 45 mL/min — ABNORMAL LOW (ref >60)
Glucose, Bld: 115 mg/dL — ABNORMAL HIGH (ref 70–99)
Potassium: 4.1 mmol/L (ref 3.5–5.1)
Sodium: 135 mmol/L (ref 135–145)

## 2020-03-26 LAB — PROTIME-INR
INR: 1.4 — ABNORMAL HIGH (ref 0.8–1.2)
Prothrombin Time: 16.3 seconds — ABNORMAL HIGH (ref 11.4–15.2)

## 2020-03-26 LAB — DIGOXIN LEVEL: Digoxin Level: 0.5 ng/mL — ABNORMAL LOW (ref 0.8–2.0)

## 2020-03-26 LAB — BPAM RBC
Blood Product Expiration Date: 202201132359
Blood Product Expiration Date: 202201132359
Blood Product Expiration Date: 202201142359
Blood Product Expiration Date: 202201152359
Blood Product Expiration Date: 202201152359
Blood Product Expiration Date: 202201162359
Blood Product Expiration Date: 202201162359
Blood Product Expiration Date: 202201162359
ISSUE DATE / TIME: 202112190830
ISSUE DATE / TIME: 202112192021
ISSUE DATE / TIME: 202112192223
ISSUE DATE / TIME: 202112200108
ISSUE DATE / TIME: 202112220900
Unit Type and Rh: 6200
Unit Type and Rh: 6200
Unit Type and Rh: 6200
Unit Type and Rh: 6200
Unit Type and Rh: 6200
Unit Type and Rh: 6200
Unit Type and Rh: 6200
Unit Type and Rh: 6200

## 2020-03-26 LAB — TYPE AND SCREEN
ABO/RH(D): A POS
Antibody Screen: NEGATIVE
Unit division: 0
Unit division: 0
Unit division: 0
Unit division: 0
Unit division: 0
Unit division: 0
Unit division: 0
Unit division: 0

## 2020-03-26 LAB — CBC
HCT: 24 % — ABNORMAL LOW (ref 39.0–52.0)
Hemoglobin: 8.1 g/dL — ABNORMAL LOW (ref 13.0–17.0)
MCH: 29.6 pg (ref 26.0–34.0)
MCHC: 33.8 g/dL (ref 30.0–36.0)
MCV: 87.6 fL (ref 80.0–100.0)
Platelets: 347 10*3/uL (ref 150–400)
RBC: 2.74 MIL/uL — ABNORMAL LOW (ref 4.22–5.81)
RDW: 15.6 % — ABNORMAL HIGH (ref 11.5–15.5)
WBC: 13.5 10*3/uL — ABNORMAL HIGH (ref 4.0–10.5)
nRBC: 0 % (ref 0.0–0.2)

## 2020-03-26 LAB — GLUCOSE, CAPILLARY
Glucose-Capillary: 79 mg/dL (ref 70–99)
Glucose-Capillary: 100 mg/dL — ABNORMAL HIGH (ref 70–99)

## 2020-03-26 SURGERY — CARDIOVERSION
Anesthesia: General

## 2020-03-26 MED ORDER — ~~LOC~~ CARDIAC SURGERY, PATIENT & FAMILY EDUCATION: Freq: Once | Status: AC

## 2020-03-26 MED ORDER — INSULIN ASPART 100 UNIT/ML ~~LOC~~ SOLN
0.0000 [IU] | Freq: Three times a day (TID) | SUBCUTANEOUS | Status: DC
  Administered 2020-03-27 – 2020-03-29 (×2): 2 [IU] via SUBCUTANEOUS

## 2020-03-26 MED ORDER — SODIUM CHLORIDE 0.9% FLUSH
3.0000 mL | Freq: Two times a day (BID) | INTRAVENOUS | Status: DC
  Administered 2020-03-26 – 2020-03-30 (×6): 3 mL via INTRAVENOUS
  Administered 2020-03-30: 11:00:00 10 mL via INTRAVENOUS
  Administered 2020-03-31: 09:00:00 3 mL via INTRAVENOUS

## 2020-03-26 MED ORDER — WARFARIN SODIUM 2 MG PO TABS
2.0000 mg | ORAL_TABLET | Freq: Every day | ORAL | Status: DC
  Administered 2020-03-26: 18:00:00 2 mg via ORAL
  Filled 2020-03-26: qty 1

## 2020-03-26 MED ORDER — SODIUM CHLORIDE 0.9 % IV SOLN: 250.0000 mL | INTRAVENOUS | Status: DC | PRN

## 2020-03-26 MED ORDER — SODIUM CHLORIDE 0.9% FLUSH: 3.0000 mL | INTRAVENOUS | Status: DC | PRN

## 2020-03-26 NOTE — Progress Notes (Signed)
CARDIAC REHAB PHASE I   PRE:  Rate/Rhythm: 78   BP:  Sitting: 132/62      SaO2: 97 RA  MODE:  Ambulation: 30 ft + 264ft   POST:  Rate/Rhythm: 86 SR  BP:  Sitting: 147/74    SaO2: 99 RA  Pt ambulated ~60ft in hallway and had to return to room to use BR. Pt than ambulated an additional 282ft in hallway assist of one with front wheel walker. Pt took several short standing rest breaks c/o back pain. Pt returned to bed. Able to maintain sternal precautions throughout session. Encouraged continued ambulation and IS use. Motivated to participate in rehab. D/c education previously completed.   [id number] Rufina Falco, RN BSN 03/26/2020 11:14 AM

## 2020-03-26 NOTE — PMR Pre-admission (Signed)
PMR Admission Coordinator Pre-Admission Assessment  Patient: Patrick Bell is an 79 y.o., male MRN: [id number] DOB: 1940/10/02 Height: 5' 10"  (177.8 cm) Weight: 82.2 kg  Insurance Information HMO: yes    PPO:      PCP:      IPA:      80/20:      OTHER:  PRIMARY: [facility] Medicare      Policy#: [id number]      Subscriber: pt CM Name: Warren Lacy with [employer]      Phone#: [phone]    Fax#: [phone] Pre-Cert#: [id number] [place] for CIR provided by Amy with Bernadene Bell, updates due to fax listed above on 12/29      Employer: n/a Benefits:  Phone #: [phone]     Name:  Eff. Date: 04/05/19     Deduct: $0      Out of Pocket Max: $4500 [phone])      Life Max: n/a CIR: $325/day for days 1-5      SNF: 20 full days Outpatient:      Co-Pay: $35/visit Home Health: 100%      Co-Pay:  DME: 80%     Co-Ins: 20% Providers: n/a SECONDARY:       Policy#:      Phone#:   Development worker, community:       Phone#:   The Therapist, art Information Summary" for patients in Inpatient Rehabilitation Facilities with attached "Privacy Act [place] Records" was provided and verbally reviewed with: Patient  Emergency Contact Information Contact Information    Name Relation Home Work Mobile   [place] Spouse [phone]  [phone]      Current Medical History  Patient Admitting Diagnosis: debility following MVR with aortic dissection and pericardial effusion  History of Present Illness: Pt is an 79 y/o male with PMH of HTN, OSA, GERD, and remote history of heavy tobacco use, admitted to [facility] on 03/11/20 for planned repair of mitral valve prolapse with severe mitral regurgitation.  During the surgical procedure they also repaired an acute type A aortic dissection.  Subsequently extubated and weaned from all cardiac drips.  Post op course complicated by afib with RVR requiring amio bolus and infusion, which has been weaned, as well as pericardial effusion which required a return to the OR for  drainage with pericardial window.   Post op course complicated by acute blood loss anemia and hypotension.  Therapy evaluations were completed and pt was recommended for CIR to progress to mod I before returning home.     Patient's medical record from [facility] has been reviewed by the rehabilitation admission coordinator and physician.  Past Medical History  Past Medical History:  Diagnosis Date  . Acute thoracic aortic dissection (HCC) 03/11/2020   Type A  . Allergy   . Asthma   . Cataract   . Cataract   . Diverticulitis   . Dyspnea   . GERD (gastroesophageal reflux disease)   . Heart murmur 12/2019  . Hyperlipidemia   . Hypertension   . Inguinal hernia bilateral, non-recurrent   . S/P aortic dissection repair 03/11/2020   supracoronary straight graft repair with resuspension of native aortic valve and open distal anastomosis for intraoperative acute type A aortic dissection   . S/P mitral valve repair 03/11/2020   Complex valvuloplasty including artificial Gore-tex neochord placement x 4, suture plication of posterior leaflet and posteromedial commissure and 32 mm Sorin Memo 4D ring annuloplasty  . Sleep apnea     Family History   family  history includes Arthritis in his brother, father, and sister; Dementia (age of onset: 30) in his mother; Heart disease in his father and mother; Hip fracture (age of onset: 42) in his mother; Osteoporosis in his mother.  Prior Rehab/Hospitalizations Has the patient had prior rehab or hospitalizations prior to admission? No  Has the patient had major surgery during 100 days prior to admission? Yes   Current Medications  Current Facility-Administered Medications:  .  0.9 %  sodium chloride infusion, 250 mL, Intravenous, PRN, Conte, Tessa N, PA-C .  albuterol (PROVENTIL) (2.5 MG/3ML) 0.083% nebulizer solution 2.5 mg, 2.5 mg, Nebulization, Q6H PRN, Conte, Tessa N, PA-C, 2.5 mg at 03/30/20 2124 .  amiodarone (PACERONE) tablet 200 mg,  200 mg, Oral, Daily, Conte, Tessa N, PA-C, 200 mg at 03/31/20 6979 .  benzonatate (TESSALON) capsule 200 mg, 200 mg, Oral, TID PRN, Harriet Pho, Tessa N, PA-C .  Chlorhexidine Gluconate Cloth 2 % PADS 6 each, 6 each, Topical, Daily, Elgie Collard, [place], 6 each at 03/31/20 4801 .  docusate sodium (COLACE) capsule 200 mg, 200 mg, Oral, Daily, Conte, Tessa N, PA-C, 200 mg at 03/29/20 1021 .  feeding supplement (BOOST / RESOURCE BREEZE) liquid 1 Container, 237 mL, Oral, TID, Elgie Collard, PA-C, 1 Container at 03/25/20 [phone] .  ferrous [id number] C-folic acid (TRINSICON / FOLTRIN) capsule 1 capsule, 1 capsule, Oral, Q breakfast, Conte, Tessa N, [place], 1 capsule at 03/31/20 0909 .  furosemide (LASIX) tablet 40 mg, 40 mg, Oral, Daily, Barrett, Erin R, PA-C, 40 mg at 03/31/20 0908 .  levothyroxine (SYNTHROID) tablet 25 mcg, 25 mcg, Oral, Q0600, Elgie Collard, PA-C, 25 mcg at 03/31/20 0617 .  MEDLINE mouth rinse, 15 mL, Mouth Rinse, BID, Conte, Tessa N, PA-C, 15 mL at 03/29/20 1022 .  metoprolol succinate (TOPROL-XL) 24 hr tablet 25 mg, 25 mg, Oral, Daily, Dorothy Spark, MD, 25 mg at 03/30/20 1034 .  metoprolol tartrate (LOPRESSOR) injection 2.5-5 mg, 2.5-5 mg, Intravenous, Q2H PRN, Conte, Tessa N, PA-C, 5 mg at 03/22/20 1236 .  montelukast (SINGULAIR) tablet 10 mg, 10 mg, Oral, QHS, Conte, Tessa N, PA-C, 10 mg at 03/30/20 2120 .  ondansetron (ZOFRAN) injection 4 mg, 4 mg, Intravenous, Q6H PRN, Elgie Collard, PA-C, 4 mg at 03/21/20 0951 .  oxyCODONE (Oxy IR/ROXICODONE) immediate release tablet 5-10 mg, 5-10 mg, Oral, Q3H PRN, Elgie Collard, PA-C, 5 mg at 03/24/20 0712 .  pantoprazole (PROTONIX) EC tablet 40 mg, 40 mg, Oral, QHS, Conte, Tessa N, PA-C, 40 mg at 03/30/20 2120 .  potassium chloride SA (KLOR-CON) CR tablet 20 mEq, 20 mEq, Oral, BID, Conte, Tessa N, PA-C, 20 mEq at 03/31/20 0910 .  simvastatin (ZOCOR) tablet 10 mg, 10 mg, Oral, QHS, Conte, Tessa N, PA-C, 10 mg at 03/30/20 2120 .   sodium chloride flush (NS) 0.9 % injection 3 mL, 3 mL, Intravenous, Q12H, Conte, Tessa N, PA-C, 3 mL at 03/31/20 0910 .  sodium chloride flush (NS) 0.9 % injection 3 mL, 3 mL, Intravenous, PRN, Conte, Tessa N, PA-C .  traMADol Veatrice Bourbon) tablet 50-100 mg, 50-100 mg, Oral, Q4H PRN, Harriet Pho, Tessa N, PA-C, 50 mg at 03/24/20 2004 .  warfarin (COUMADIN) tablet 3 mg, 3 mg, Oral, q1600, Rexene Alberts, MD .  Warfarin - Physician Dosing Inpatient, , Does not apply, q1600, Miguel Aschoff, Given at 03/29/20 1737  Patients Current Diet:  Diet Order            Diet heart  healthy/carb modified Room service appropriate? Yes with Assist; Fluid consistency: Thin  Diet effective now                 Precautions / Restrictions Precautions Precautions: Sternal,Fall Precaution Booklet Issued: Yes (comment) Precaution Comments: reviewed "move in the tube" and no hard pushing/pulling with UEs, Restrictions Weight Bearing Restrictions: Yes (sternal precautions) RUE Weight Bearing: Weight bearing as tolerated LUE Weight Bearing: Weight bearing as tolerated Other Position/Activity Restrictions: Sternal precautions   Has the patient had 2 or more falls or a fall with injury in the past year? No  Prior Activity Level Community (5-7x/wk): retired but very active prior to admit, still driving, no DME used  Prior Functional Level Self Care: Did the patient need help bathing, dressing, using the toilet or eating? Independent  Indoor Mobility: Did the patient need assistance with walking from room to room (with or without device)? Independent  Stairs: Did the patient need assistance with internal or external stairs (with or without device)? Independent  Functional Cognition: Did the patient need help planning regular tasks such as shopping or remembering to take medications? Independent  Home Assistive Devices / Equipment Home Assistive Devices/Equipment: None Home Equipment: None  Prior Device Use:  Indicate devices/aids used by the patient prior to current illness, exacerbation or injury? None of the above  Current Functional Level Cognition  Overall Cognitive Status: Within Functional Limits for tasks assessed Orientation Level: Oriented X4 General Comments: appears [facility] for basic mobility tasks.    Extremity Assessment (includes Sensation/Coordination)  Upper Extremity Assessment: Generalized weakness  Lower Extremity Assessment: Defer to PT evaluation    ADLs  Overall ADL's : Needs assistance/impaired Eating/Feeding: Independent,Sitting Grooming: Min guard,Standing,Oral care,Wash/dry hands,Wash/dry face Grooming Details (indicate cue type and reason): Pt will need further education on compensatory strategies for grooming tasks and "staying in the tube" Upper Body Bathing: Minimal assistance,Sitting Lower Body Bathing: Minimal assistance,Sit to/from stand Lower Body Bathing Details (indicate cue type and reason): knees down Upper Body Dressing : Minimal assistance,Sitting Lower Body Dressing: Moderate assistance,Sit to/from stand Toilet Transfer: Regular Toilet,Ambulation,RW,Minimal assistance,+2 for safety/equipment Toilet Transfer Details (indicate cue type and reason): rocking motion for momentum Toileting- Clothing Manipulation and Hygiene: Moderate assistance Functional mobility during ADLs: Minimal assistance,Rolling walker General ADL Comments: Pt requiring Min A +2 for fucntional transfers and then safety during mobility inhallway. Pt with forward lean into eva walker and requiring tactile cues for upright posture. Fatigues quickly. VSS throughout    Mobility  Overal bed mobility: Needs Assistance Bed Mobility: Rolling,Sidelying to Sit,Sit to Sidelying Rolling: Min guard Sidelying to sit: Min assist,HOB elevated Supine to sit: Mod assist,HOB elevated Sit to sidelying: Min assist General bed mobility comments: Assist with trunk to get to EOB, cues for technique; Cues  to adhere to sternal precautions. assist for LES back into bed    Transfers  Overall transfer level: Needs assistance Equipment used: Rolling walker (2 wheeled) Transfers: Sit to/from Stand Sit to Stand: Min assist General transfer comment: Cues for hand placement on B knees to push into standing.  Pt frequently attempting to reach outside "the tube"    Ambulation / Gait / Stairs / Wheelchair Mobility  Ambulation/Gait Ambulation/Gait assistance: Counsellor (Feet): 400 Feet Assistive device: Rolling walker (2 wheeled) Gait Pattern/deviations: Decreased stride length,Trunk flexed,Step-through pattern General Gait Details: Slow, mostly steady gait with cues for RW proximity, upright posture and to relax traps; Multiple standing rest breaks; Sp02 93% on RA, cues for pursed lip breathing.  Pt reports fatigue. Constant cues needed to stand up tall and extend shoulders Gait velocity: decreased Gait velocity interpretation: <1.31 ft/sec, indicative of household ambulator    Posture / Balance Dynamic Sitting Balance Sitting balance - Comments: Supervision for safety, prefers to lean on RW or legs for support. Balance Overall balance assessment: Needs assistance Sitting-balance support: Feet supported,No upper extremity supported Sitting balance-Leahy Scale: Fair Sitting balance - Comments: Supervision for safety, prefers to lean on RW or legs for support. Standing balance support: During functional activity Standing balance-Leahy Scale: Poor Standing balance comment: Requires UE support in standing.    Special needs/care consideration Skin sternal incision   Previous Home Environment (from acute therapy documentation) Living Arrangements: Spouse/significant other Available Help at Discharge: Family,Available 24 hours/day Type of Home: House Home Layout: One level Home Access: Stairs to enter Entrance Stairs-Rails: Left Entrance Stairs-Number of Steps: 3 Home Care Services:  No  Discharge Living Setting Plans for Discharge Living Setting: Patient's home Type of Home at Discharge: House Discharge Home Layout: One level Discharge Home Access: Stairs to enter Entrance Stairs-Rails: Left Entrance Stairs-Number of Steps: 3 Discharge Bathroom Shower/Tub: Tub/shower unit Discharge Bathroom Toilet: Standard Discharge Bathroom Accessibility: Yes How Accessible: Accessible via walker Does the patient have any problems obtaining your medications?: No  Social/Family/Support Systems Patient Roles: Spouse Anticipated Caregiver: spouse, Channon Ambrosini Anticipated Caregiver's Contact Information: [phone] (cell) Ability/Limitations of Caregiver: supervision only Caregiver Availability: 24/7 Discharge Plan Discussed with Primary Caregiver: Yes Is Caregiver In Agreement with Plan?: Yes Does Caregiver/Family have Issues with Lodging/Transportation while Pt is in Rehab?: No  Goals Patient/Family Goal for Rehab: PT/OT mod I, SLP n/a Expected length of stay: 6-9 days Pt/Family Agrees to Admission and willing to participate: Yes Program Orientation Provided & Reviewed with Pt/Caregiver Including Roles  & Responsibilities: Yes  Decrease burden of Care through IP rehab admission: n/a  Possible need for SNF placement upon discharge: No.   Patient Condition: I have reviewed medical records from [facility], spoken with CM, and patient and spouse. I met with patient at the bedside for inpatient rehabilitation assessment.  Patient will benefit from ongoing PT and OT, can actively participate in 3 hours of therapy a day 5 days of the week, and can make measurable gains during the admission.  Patient will also benefit from the coordinated team approach during an Inpatient Acute Rehabilitation admission.  The patient will receive intensive therapy as well as Rehabilitation physician, nursing, social worker, and care management interventions.  Due to safety, skin/wound care,  medication administration, pain management and patient education the patient requires 24 hour a day rehabilitation nursing.  The patient is currently min assist overall with mobility and basic ADLs.  Discharge setting and therapy post discharge at home is anticipated.  Patient has agreed to participate in the Acute Inpatient Rehabilitation Program and will admit today.  Preadmission Screen Completed By: Shann Medal, PT, DPT, with day of admission updates by Cleatrice Burke, 03/31/2020 10:15 AM ______________________________________________________________________   Discussed status with Dr. Ranell Patrick on 03/31/20  at 10:15 AM  and received approval for admission today.   Admission Coordinator: Shann Medal, PT, DPT, and  Cleatrice Burke, RN, time 10:15 AM Sudie Grumbling 03/31/20    Assessment/Plan: Diagnosis: Debility s/p mitral valve repair 1. Does the need for close, 24 hr/day Medical supervision in concert with the patient's rehab needs make it unreasonable for this patient to be served in a less intensive setting? Yes 2. Co-Morbidities requiring supervision/potential complications: essential HTN,  overweight (BMI 26), OSA, GERD, HLD, s/p aortic dissection repair 3. Due to bladder management, bowel management, safety, skin/wound care, disease management, medication administration, pain management and patient education, does the patient require 24 hr/day rehab nursing? Yes 4. Does the patient require coordinated care of a physician, rehab nurse, PT, OT to address physical and functional deficits in the context of the above medical diagnosis(es)? Yes Addressing deficits in the following areas: balance, endurance, locomotion, strength, transferring, bowel/bladder control, bathing, dressing, feeding, grooming, toileting and psychosocial support 5. Can the patient actively participate in an intensive therapy program of at least 3 hrs of therapy 5 days a week? Yes 6. The potential for patient to  make measurable gains while on inpatient rehab is excellent 7. Anticipated functional outcomes upon discharge from inpatient rehab: modified independent PT, modified independent OT, independent SLP 8. Estimated rehab length of stay to reach the above functional goals is: 5-7 days 9. Anticipated discharge destination: Home 10. Overall Rehab/Functional Prognosis: excellent   MD Signature: Leeroy Cha, MD

## 2020-03-26 NOTE — Progress Notes (Signed)
Inpatient Rehab Admissions Coordinator:   Left voicemail for [facility] Medicare case manager handling expedited appeal for update.  Will continue to follow.   Shann Medal, PT, DPT Admissions Coordinator [phone] 03/26/20  12:13 PM

## 2020-03-26 NOTE — Progress Notes (Signed)
Pt arrived to rm 3 from [facility]. Initiated tele. CHG wipe provided. VSS. Call belll within reach.   Lavenia Atlas, RN

## 2020-03-26 NOTE — Progress Notes (Signed)
4 Days Post-Op Procedure(s) (LRB): SUBXYPHOID POST-OP PERICARDIAL FLUID DRAINAGE WITH CHEST TUBE INSERTION. (N/A) CHEST TUBE INSERTION OF 28 BLAKE DRAIN. (N/A) TRANSESOPHAGEAL ECHOCARDIOGRAM (TEE) (N/A) Subjective:  Slept ok. No specific complaints.  Objective: Vital signs in last 24 hours: Temp:  [97.5 F (36.4 C)-99.2 F (37.3 C)] 98.1 F (36.7 C) (12/23 0300) Pulse Rate:  [73-81] 81 (12/23 0400) Cardiac Rhythm: Normal sinus rhythm (12/22 1600) Resp:  [12-23] 20 (12/23 0400) BP: (106-135)/(46-83) 117/53 (12/23 0400) SpO2:  [90 %-99 %] 94 % (12/23 0400) Weight:  [91.6 kg] 91.6 kg (12/23 0500)  Hemodynamic parameters for last 24 hours:    Intake/Output from previous day: 12/22 0701 - 12/23 0700 In: 896 [P.O.:560; Blood:336] Out: 1058 [Urine:1058] Intake/Output this shift: No intake/output data recorded.  General appearance: alert and cooperative Neurologic: intact Heart: regular rate and rhythm, S1, S2 normal, no murmur Lungs: diminished breath sounds bibasilar Abdomen: soft, non-tender; bowel sounds normal Extremities: moderate edema Wound: incision healing well  Lab Results: Recent Labs    03/25/20 0327 03/26/20 0448  WBC 13.1* 13.5*  HGB 7.1* 8.1*  HCT 21.1* 24.0*  PLT 311 347   BMET:  Recent Labs    03/25/20 0327 03/26/20 0448  NA 133* 135  K 3.6 4.1  CL 99 101  CO2 26 26  GLUCOSE 102* 115*  BUN 25* 21  CREATININE 1.39* 1.56*  CALCIUM 7.4* 8.0*    PT/INR:  Recent Labs    03/26/20 0448  LABPROT 16.3*  INR 1.4*   ABG    Component Value Date/Time   PHART 7.373 03/12/2020 1034   HCO3 26.1 03/12/2020 1034   TCO2 23 03/12/2020 1658   ACIDBASEDEF 2.0 03/12/2020 0909   O2SAT 68.9 03/15/2020 0455   CBG (last 3)  No results for input(s): GLUCAP in the last 72 hours.  Assessment/Plan: S/P Procedure(s) (LRB): SUBXYPHOID POST-OP PERICARDIAL FLUID DRAINAGE WITH CHEST TUBE INSERTION. (N/A) CHEST TUBE INSERTION OF 28 BLAKE DRAIN.  (N/A) TRANSESOPHAGEAL ECHOCARDIOGRAM (TEE) (N/A)  POD 14 MV repair and repair aortic dissection. POD 4 pericardial window  Hemodynamically stable in sinus rhythm on midodrine 5 bid, amio po and digoxin 0.125. Dig level 0.5 but with kidney function will continue current dose and follow.  Volume excess: wt is still 17 lbs over preop, unchanged from yesterday but general trend is down. Continue lasix. Creat up slightly to 1.56 from 1.39 yesterday. It was normal preop. Follow daily. Probably postop AKI from pump run, hypotension. This should gradually improve.  INR down to 1.4. only received 1 mg Coumadin last pm. Increase to 2 mg daily.   Acute postop blood loss anemia: transfused 1 unit yesterday and had appropriate response with Hgb 7.1 to 8.1. Continue iron.  Continue IS, ambulation  Awaiting bed on 4E and decision on CIR.   LOS: 15 days    Gaye Pollack 03/26/2020

## 2020-03-27 LAB — GLUCOSE, CAPILLARY
Glucose-Capillary: 86 mg/dL (ref 70–99)
Glucose-Capillary: 121 mg/dL — ABNORMAL HIGH (ref 70–99)
Glucose-Capillary: 92 mg/dL (ref 70–99)
Glucose-Capillary: 89 mg/dL (ref 70–99)

## 2020-03-27 LAB — PROTIME-INR
INR: 1.3 — ABNORMAL HIGH (ref 0.8–1.2)
Prothrombin Time: 15.3 seconds — ABNORMAL HIGH (ref 11.4–15.2)

## 2020-03-27 MED ORDER — WARFARIN SODIUM 2.5 MG PO TABS
2.5000 mg | ORAL_TABLET | Freq: Every day | ORAL | Status: DC
  Administered 2020-03-27: 17:00:00 2.5 mg via ORAL
  Filled 2020-03-27: qty 1

## 2020-03-27 NOTE — Progress Notes (Addendum)
5 Days Post-Op Procedure(s) (LRB): SUBXYPHOID POST-OP PERICARDIAL FLUID DRAINAGE WITH CHEST TUBE INSERTION. (N/A) CHEST TUBE INSERTION OF 28 BLAKE DRAIN. (N/A) TRANSESOPHAGEAL ECHOCARDIOGRAM (TEE) (N/A) Subjective:  Awake and alert, feels he is progressing. Walked in the hall with assistance.   Objective: Vital signs in last 24 hours: Temp:  [97.5 F (36.4 C)-98.5 F (36.9 C)] 97.7 F (36.5 C) (12/24 0739) Pulse Rate:  [76-80] 78 (12/24 0739) Cardiac Rhythm: Normal sinus rhythm (12/24 0700) Resp:  [14-20] 18 (12/24 0739) BP: (123-134)/(52-63) 133/58 (12/24 0739) SpO2:  [93 %-98 %] 94 % (12/24 0739) Weight:  [89.4 kg] 89.4 kg (12/24 0342)  Hemodynamic parameters for last 24 hours:    Intake/Output from previous day: 12/23 0701 - 12/24 0700 In: 850 [P.O.:850] Out: 1350 [Urine:1350] Intake/Output this shift: Total I/O In: -  Out: 125 [Urine:125]  General appearance: alert and cooperative Neurologic: intact Heart: regular rate and rhythm,  no murmur Lungs: diminished breath sounds bibasilar Abdomen: soft, non-tender; bowel sounds normal Extremities: moderate edema Wound: the sternotomy, right chest and right groin.incisions are dry and healing well  Lab Results: Recent Labs    03/25/20 0327 03/26/20 0448  WBC 13.1* 13.5*  HGB 7.1* 8.1*  HCT 21.1* 24.0*  PLT 311 347   BMET:  Recent Labs    03/25/20 0327 03/26/20 0448  NA 133* 135  K 3.6 4.1  CL 99 101  CO2 26 26  GLUCOSE 102* 115*  BUN 25* 21  CREATININE 1.39* 1.56*  CALCIUM 7.4* 8.0*    PT/INR:  Recent Labs    03/27/20 0407  LABPROT 15.3*  INR 1.3*   ABG    Component Value Date/Time   PHART 7.373 03/12/2020 1034   HCO3 26.1 03/12/2020 1034   TCO2 23 03/12/2020 1658   ACIDBASEDEF 2.0 03/12/2020 0909   O2SAT 68.9 03/15/2020 0455   CBG (last 3)  Recent Labs    03/26/20 1659 03/26/20 2208 03/27/20 0619  GLUCAP 79 100* 86    Assessment/Plan: S/P Procedure(s) (LRB): SUBXYPHOID POST-OP  PERICARDIAL FLUID DRAINAGE WITH CHEST TUBE INSERTION. (N/A) CHEST TUBE INSERTION OF 28 BLAKE DRAIN. (N/A) TRANSESOPHAGEAL ECHOCARDIOGRAM (TEE) (N/A)  POD 115 MV repair and repair aortic dissection. POD 5 pericardial window  Hemodynamically stable in sinus rhythm on midodrine 5 bid, amio po. Digoxin discontinued.  Volume excess: wt is about 12 lbs over preop,  Continue lasix. Creat up slightly to 1.56 yesterday, no new lab today.  It was normal preop. Follow daily. Probably postop AKI from pump run, hypotension. This should gradually improve.  INR continues to trend down, 1.3 today.  Increase to 2.5 mg daily.   Acute postop blood loss anemia: transfused 1 unit 12/22 and had appropriate response with Hgb 7.1 to 8.1. Continue iron. Lab in AM.   Continue IS, ambulation  Awaiting decision on CIR.   LOS: 16 days    Antony Odea, PA-C [phone] 03/27/2020  Adjust coumadin- never got vit k but coumadin held when inr elevated CIR re consulted and waiting for insurance approval On midotine 5 mg bid Cr 1.5 -1.6 Patient had said he did not take thyroid medications , but after explaining he would take it  I have seen and examined Patrick Bell and agree with the above assessment  and plan.  Grace Isaac MD Beeper [phone] Office [phone] 03/27/2020 10:19 AM

## 2020-03-27 NOTE — Progress Notes (Signed)
Physical Therapy Treatment Patient Details Name: Patrick Bell MRN: [id number] DOB: 12-14-1940 Today's Date: 03/27/2020    History of Present Illness Patient is a 79 y/o male who presents s/p MVP and repair of acute ascending thoracic aortic dissection 12/8. Pericardial fluid drainage with chest tube placement on 12/19. PMH includes HTN.    PT Comments    Patient progressing well towards PT goals. Continues to require assist for bed mobility and standing from low surfaces as well as cues to adhere to sternal precautions throughout mobility. Improved ambulation distance with 2 seated rest breaks (1 for bathroom) and 2 standing rest breaks with cues for upright posture and RW proximity. Sp02 dropped to 89% on RA with activity but recovered quickly with cues for breathing. Pt motivated to get to CIR. Encouraged OOB to chair for lunch and all meals. Will follow.    Follow Up Recommendations  CIR;Supervision for mobility/OOB;Supervision/Assistance - 24 hour     Equipment Recommendations  Rolling walker with 5" wheels    Recommendations for Other Services       Precautions / Restrictions Precautions Precautions: Sternal;Fall Precaution Booklet Issued: Yes (comment) Precaution Comments: reviewed "move in the tube" and no pushing/pulling with UEs, Restrictions Weight Bearing Restrictions: Yes RUE Weight Bearing: Weight bearing as tolerated LUE Weight Bearing: Weight bearing as tolerated Other Position/Activity Restrictions: Sternal precautions    Mobility  Bed Mobility Overal bed mobility: Needs Assistance Bed Mobility: Rolling;Sidelying to Sit;Sit to Sidelying Rolling: Min assist Sidelying to sit: Min assist;HOB elevated     Sit to sidelying: Min guard;HOB elevated General bed mobility comments: Assist with trunk to get to EOB, cues for technique; Cues to adhere to sternal precautions.  Transfers Overall transfer level: Needs assistance Equipment used: Rolling walker (2  wheeled) Transfers: Sit to/from Stand Sit to Stand: Min assist;Min guard         General transfer comment: Cues for hand placement on B knees to push into standing.  Pt frequently attempting to reach outside "the tube" Stood from EOB x1, from toilet x1, more difficulty from lower surface.  Ambulation/Gait Ambulation/Gait assistance: Min guard Gait Distance (Feet): 350 Feet (+150') Assistive device: Rolling walker (2 wheeled) Gait Pattern/deviations: Decreased stride length;Trunk flexed;Step-through pattern Gait velocity: decreased   General Gait Details: Slow, mostly steady gait with cues for RW proximity, upright posture and to relax traps; 1 seated rest break, 2 standing rest breaks; Sp02 dropped to 89% on RA, cues for pursed lip breathing.   Stairs             Wheelchair Mobility    Modified Rankin (Stroke Patients Only)       Balance Overall balance assessment: Needs assistance Sitting-balance support: Feet supported;No upper extremity supported Sitting balance-Leahy Scale: Fair Sitting balance - Comments: Supervision for safety, prefers to lean on RW or legs for support.   Standing balance support: During functional activity Standing balance-Leahy Scale: Poor Standing balance comment: Requires UE support in standing.                            Cognition Arousal/Alertness: Awake/alert Behavior During Therapy: WFL for tasks assessed/performed Overall Cognitive Status: Within Functional Limits for tasks assessed                                 General Comments: appears [facility] for basic mobility tasks.      Exercises  General Comments General comments (skin integrity, edema, etc.): Pre activity BP 133/58, post activity BP 144/66. Sp02 dropped to 89% with activity  on RA with reported dizziness but resolved quickly with breathing.      Pertinent Vitals/Pain Pain Assessment: Faces Faces Pain Scale: Hurts little more Pain  Location: incision on right, low back Pain Descriptors / Indicators: Sore;Aching;Grimacing Pain Intervention(s): Monitored during session;Repositioned    Home Living                      Prior Function            PT Goals (current goals can now be found in the care plan section) Progress towards PT goals: Progressing toward goals    Frequency    Min 3X/week      PT Plan Current plan remains appropriate    Co-evaluation              AM-PAC PT "6 Clicks" Mobility   Outcome Measure  Help needed turning from your back to your side while in a flat bed without using bedrails?: A Little Help needed moving from lying on your back to sitting on the side of a flat bed without using bedrails?: A Lot Help needed moving to and from a bed to a chair (including a wheelchair)?: A Little Help needed standing up from a chair using your arms (e.g., wheelchair or bedside chair)?: A Little Help needed to walk in hospital room?: A Little Help needed climbing 3-5 steps with a railing? : A Little 6 Click Score: 17    End of Session Equipment Utilized During Treatment: Gait belt Activity Tolerance: Patient tolerated treatment well Patient left: in bed;with call bell/phone within reach;with bed alarm set Nurse Communication: Mobility status PT Visit Diagnosis: Muscle weakness (generalized) (M62.81);Unsteadiness on feet (R26.81);Difficulty in walking, not elsewhere classified (R26.2);Dizziness and giddiness (R42)     Time: 2025-4270 PT Time Calculation (min) (ACUTE ONLY): 46 min  Charges:  $Gait Training: 23-37 mins $Therapeutic Activity: 8-22 mins                     Marisa Severin, PT, DPT Acute Rehabilitation Services Pager [phone] Office [place] 03/27/2020, 10:20 AM

## 2020-03-27 NOTE — Progress Notes (Signed)
Cardiology Progress Note  Patient ID: Patrick Bell MRN: [id number] DOB: [birth year] Date of Encounter: 03/27/2020  Primary Cardiologist: Shelva Majestic, MD  Subjective   Chief Complaint: Weakness  HPI: Reports weakness and fatigue.  Maintaining sinus rhythm.  Received transfusion 03/25/2020.  Hemoglobin today is pending.  INR 1.3.  ROS:  All other ROS reviewed and negative. Pertinent positives noted in the HPI.     Inpatient Medications  Scheduled Meds: . amiodarone  200 mg Oral Daily  . Chlorhexidine Gluconate Cloth  6 each Topical Daily  . digoxin  0.125 mg Oral Daily  . docusate sodium  200 mg Oral Daily  . feeding supplement  237 mL Oral TID  . ferrous [id number] C-folic acid  1 capsule Oral Q breakfast  . furosemide  40 mg Oral BID  . insulin aspart  0-24 Units Subcutaneous TID AC & HS  . levothyroxine  25 mcg Oral Q0600  . mouth rinse  15 mL Mouth Rinse BID  . midodrine  5 mg Oral BID WC  . montelukast  10 mg Oral QHS  . pantoprazole  40 mg Oral QHS  . potassium chloride  20 mEq Oral BID  . simvastatin  10 mg Oral QHS  . sodium chloride flush  3 mL Intravenous Q12H  . warfarin  2 mg Oral q1600  . Warfarin - Physician Dosing Inpatient   Does not apply q1600   Continuous Infusions: . sodium chloride     PRN Meds: sodium chloride, albuterol, benzonatate, metoprolol tartrate, ondansetron (ZOFRAN) IV, oxyCODONE, sodium chloride flush, traMADol   Vital Signs   Vitals:   03/26/20 2311 03/27/20 0316 03/27/20 0342 03/27/20 0739  BP: (!) 123/52 (!) 134/56  (!) 133/58  Pulse: 79 77  78  Resp: 20 15  18   Temp: 98 F (36.7 C) 98.5 F (36.9 C)  97.7 F (36.5 C)  TempSrc: Oral Oral  Oral  SpO2: 93% 95%  94%  Weight:   89.4 kg   Height:        Intake/Output Summary (Last 24 hours) at 03/27/2020 0743 Last data filed at 03/27/2020 0328 Gross per 24 hour  Intake 850 ml  Output 1350 ml  Net -500 ml   Last 3 Weights 03/27/2020 03/26/2020 03/25/2020   Weight (lbs) 197 lb 1.5 oz 201 lb 15.1 oz 201 lb 15.1 oz  Weight (kg) 89.4 kg 91.6 kg 91.6 kg      Telemetry  Overnight telemetry shows sinus rhythm with heart rate in the 70 bpm range, which I personally reviewed.    Physical Exam   Vitals:   03/26/20 2311 03/27/20 0316 03/27/20 0342 03/27/20 0739  BP: (!) 123/52 (!) 134/56  (!) 133/58  Pulse: 79 77  78  Resp: 20 15  18   Temp: 98 F (36.7 C) 98.5 F (36.9 C)  97.7 F (36.5 C)  TempSrc: Oral Oral  Oral  SpO2: 93% 95%  94%  Weight:   89.4 kg   Height:         Intake/Output Summary (Last 24 hours) at 03/27/2020 0743 Last data filed at 03/27/2020 0328 Gross per 24 hour  Intake 850 ml  Output 1350 ml  Net -500 ml    Last 3 Weights 03/27/2020 03/26/2020 03/25/2020  Weight (lbs) 197 lb 1.5 oz 201 lb 15.1 oz 201 lb 15.1 oz  Weight (kg) 89.4 kg 91.6 kg 91.6 kg    Body mass index is 28.28 kg/m.   General: Well nourished, well developed, in  no acute distress Head: Atraumatic, normal size  Eyes: PEERLA, EOMI  Neck: Supple, no JVD Endocrine: No thryomegaly Cardiac: Normal S1, S2; RRR; no murmurs, rubs, or gallops Lungs: Clear to auscultation bilaterally, no wheezing, rhonchi or rales  Abd: Soft, nontender, no hepatomegaly  Ext: Trace edema Musculoskeletal: No deformities, BUE and BLE strength normal and equal Skin: Midline surgical scar clean and dry without hematoma or evidence of infection Neuro: Alert and oriented to person, place, time, and situation, CNII-XII grossly intact, no focal deficits  Psych: Normal mood and affect   Labs  High Sensitivity Troponin:  No results for input(s): TROPONINIHS in the last 720 hours.   Cardiac EnzymesNo results for input(s): TROPONINI in the last 168 hours. No results for input(s): TROPIPOC in the last 168 hours.  Chemistry Recent Labs  Lab 03/21/20 1407 03/22/20 0111 03/23/20 0355 03/24/20 0436 03/25/20 0327 03/26/20 0448  NA  --    < > 132* 132* 133* 135  K  --    < > 4.4  4.1 3.6 4.1  CL  --    < > 98 95* 99 101  CO2  --    < > 27 27 26 26   GLUCOSE  --    < > 145* 92 102* 115*  BUN  --    < > 31* 30* 25* 21  CREATININE  --    < > 1.63* 1.47* 1.39* 1.56*  CALCIUM  --    < > 7.7* 7.7* 7.4* 8.0*  PROT  --   --  4.9*  --   --   --   ALBUMIN 2.1*  --  2.0*  --   --   --   AST  --   --  35  --   --   --   ALT  --   --  32  --   --   --   ALKPHOS  --   --  62  --   --   --   BILITOT  --   --  0.7  --   --   --   GFRNONAA  --    < > 42* 48* 51* 45*  ANIONGAP  --    < > 7 10 8 8    < > = values in this interval not displayed.    Hematology Recent Labs  Lab 03/24/20 0436 03/24/20 1235 03/25/20 0327 03/26/20 0448  WBC 14.8*  --  13.1* 13.5*  RBC 2.63*  --  2.36* 2.74*  HGB 7.8* 8.5* 7.1* 8.1*  HCT 23.8* 25.3* 21.1* 24.0*  MCV 90.5  --  89.4 87.6  MCH 29.7  --  30.1 29.6  MCHC 32.8  --  33.6 33.8  RDW 14.6  --  15.0 15.6*  PLT 319  --  311 347   BNPNo results for input(s): BNP, PROBNP in the last 168 hours.  DDimer No results for input(s): DDIMER in the last 168 hours.   Radiology  DG Chest 2 View  Result Date: 03/26/2020 CLINICAL DATA:  Pleural effusion EXAM: CHEST - 2 VIEW COMPARISON:  03/25/2020 FINDINGS: No significant interval change in chest radiographs with cardiomegaly and small bilateral pleural effusions. No new or focal airspace opacity. Left upper extremity PICC. Disc degenerative disease of the thoracic spine. IMPRESSION: No significant interval change in chest radiographs with cardiomegaly and small bilateral pleural effusions. No new or focal airspace opacity. Electronically Signed   By: 03/28/2020 M.D.   On:  03/26/2020 14:26    Cardiac Studies  TTE 03/21/2020  1. There is a large pericardial effusion present that present posterior  to the LV and surrounding the apex. It measures 2.2-2.6 cm posterior to  the LV and ~1.6 cm in the apical views. There is ~25% inspiratory  variation in the MV inflow. Due to  bandages, the IVC was  not visualized. The effusion is new since the last  echocardiogram. Due to hypotension, there are concerns for tamponade.  Clinical correlation recommended. Large pericardial effusion. The  pericardial effusion is posterior to the left  ventricle and surrounding the apex.  2. Left ventricular ejection fraction, by estimation, is 45 to 50%. The  left ventricle has mildly decreased function. The left ventricle  demonstrates global hypokinesis. Left ventricular diastolic function could  not be evaluated.  3. Right ventricular systolic function is normal. The right ventricular  size is normal.  4. MV repair with 32 mm sorin annuloplasty ring. MG 3.1 mmHG @ 98 bpm.  The mitral valve has been repaired/replaced. No evidence of mitral valve  regurgitation. There is a 32 mm prosthetic annuloplasty ring present in  the mitral position. Procedure Date:  03/11/2020.  5. The aortic valve is tricuspid. Aortic valve regurgitation is mild. No  aortic stenosis is present.  6. Ascending aorta repair. Measures 3.0 cm. Aortic root/ascending aorta  has been repaired/replaced.   Patient Profile  Patrick Bell is a 79 y.o. male with hypertension, hyperlipidemia, asthma, severe mitral regurgitation status post MVR on 03/11/2020.  Cardiology was consulted on 03/21/2020 for atrial fibrillation.  He was found to have a large pericardial effusion.  He underwent pericardial window and evacuation on 03/22/2020.  Assessment & Plan   1.  Severe mitral regurgitation status post mitral valve repair -Doing well postop.  Course complicated by large pericardial effusion status post evacuation. -Continue with aggressive physical therapy and Occupational Therapy. -Continue Coumadin  2.  Postop A. Fib -Had brief postop A. fib after surgery.  And had persistent A. fib.  On 03/22/2019 when he was found to have a large pericardial effusion that was likely driving his atrial fibrillation.  He is status post evacuation of  the effusion.  Maintaining sinus rhythm. -Continue p.o. amnio. -He was placed on digoxin briefly for rate control in the setting of hypotension.  Since he is in normal sinus rhythm we will stop his digoxin.  3.  Trace lower extremity edema -Diuresis per CT surgery  4.  Postop pericardial effusion -Status post drainage now with window.  5.  Postoperative anemia -Status post transfusion 03/25/2020.  Suspect this is all related to surgery.  May consider repeat limited echo just to ensure pericardial effusion has not returned.  6.  Hypothyroidism -TSH 6.5 with low T3.  Continue thyroid supplementation per surgery.  For questions or updates, please contact [place] Please consult [email or web address] for contact info under   Time Spent with Patient: I have spent a total of 25 minutes with patient reviewing hospital notes, telemetry, EKGs, labs and examining the patient as well as establishing an assessment and plan that was discussed with the patient.  > 50% of time was spent in direct patient care.    Signed, Addison Naegeli. Audie Box, [place]  03/27/2020 7:43 AM

## 2020-03-28 ENCOUNTER — Inpatient Hospital Stay (HOSPITAL_COMMUNITY): Payer: Medicare Other

## 2020-03-28 DIAGNOSIS — I10 Essential (primary) hypertension: Secondary | ICD-10-CM

## 2020-03-28 DIAGNOSIS — I340 Nonrheumatic mitral (valve) insufficiency: Principal | ICD-10-CM

## 2020-03-28 DIAGNOSIS — I313 Pericardial effusion (noninflammatory): Secondary | ICD-10-CM

## 2020-03-28 DIAGNOSIS — I7101 Dissection of thoracic aorta: Secondary | ICD-10-CM

## 2020-03-28 DIAGNOSIS — E7800 Pure hypercholesterolemia, unspecified: Secondary | ICD-10-CM

## 2020-03-28 LAB — GLUCOSE, CAPILLARY
Glucose-Capillary: 86 mg/dL (ref 70–99)
Glucose-Capillary: 105 mg/dL — ABNORMAL HIGH (ref 70–99)
Glucose-Capillary: 85 mg/dL (ref 70–99)
Glucose-Capillary: 116 mg/dL — ABNORMAL HIGH (ref 70–99)

## 2020-03-28 LAB — BASIC METABOLIC PANEL
Anion gap: 8 (ref 5–15)
BUN: 16 mg/dL (ref 8–23)
CO2: 24 mmol/L (ref 22–32)
Calcium: 7.9 mg/dL — ABNORMAL LOW (ref 8.9–10.3)
Chloride: 101 mmol/L (ref 98–111)
Creatinine, Ser: 1.41 mg/dL — ABNORMAL HIGH (ref 0.61–1.24)
GFR, Estimated: 50 mL/min — ABNORMAL LOW (ref >60)
Glucose, Bld: 120 mg/dL — ABNORMAL HIGH (ref 70–99)
Potassium: 3.9 mmol/L (ref 3.5–5.1)
Sodium: 133 mmol/L — ABNORMAL LOW (ref 135–145)

## 2020-03-28 LAB — ECHOCARDIOGRAM LIMITED
Calc EF: 60.5 %
Height: 70 in
Single Plane A2C EF: 61.9 %
Single Plane A4C EF: 61.8 %
Weight: 3202.84 oz

## 2020-03-28 LAB — PROTIME-INR
INR: 1.2 (ref 0.8–1.2)
Prothrombin Time: 14.9 seconds (ref 11.4–15.2)

## 2020-03-28 MED ORDER — WARFARIN SODIUM 4 MG PO TABS
4.0000 mg | ORAL_TABLET | Freq: Every day | ORAL | Status: DC
  Administered 2020-03-28 – 2020-03-29 (×2): 4 mg via ORAL
  Filled 2020-03-28 (×2): qty 1

## 2020-03-28 MED ORDER — METOPROLOL SUCCINATE ER 25 MG PO TB24
25.0000 mg | ORAL_TABLET | Freq: Every day | ORAL | Status: DC
  Administered 2020-03-28 – 2020-03-30 (×3): 25 mg via ORAL
  Filled 2020-03-28 (×4): qty 1

## 2020-03-28 NOTE — CV Procedure (Signed)
2D echo attempted, patient in chair eating. Will try later.

## 2020-03-28 NOTE — Progress Notes (Signed)
  Echocardiogram 2D Echocardiogram has been performed.  Patrick Bell 03/28/2020, 3:23 PM

## 2020-03-28 NOTE — Progress Notes (Addendum)
6 Days Post-Op Procedure(s) (LRB): SUBXYPHOID POST-OP PERICARDIAL FLUID DRAINAGE WITH CHEST TUBE INSERTION. (N/A) CHEST TUBE INSERTION OF 28 BLAKE DRAIN. (N/A) TRANSESOPHAGEAL ECHOCARDIOGRAM (TEE) (N/A) Subjective:  Awake and alert, feels much better today and says he "turned the corner" last evening. No new concerns. Tolerating PO's.   On RA, good sats. Pacer wires are out.   Objective: Vital signs in last 24 hours: Temp:  [97.9 F (36.6 C)-98.8 F (37.1 C)] 98.3 F (36.8 C) (12/25 0837) Pulse Rate:  [77-80] 79 (12/25 0837) Cardiac Rhythm: Bundle branch block;Heart block (12/25 0837) Resp:  [14-20] 19 (12/25 0837) BP: (111-155)/(53-66) 152/62 (12/25 0837) SpO2:  [91 %-95 %] 95 % (12/25 0837) Weight:  [90.8 kg] 90.8 kg (12/25 0311)   Intake/Output from previous day: 12/24 0701 - 12/25 0700 In: 880 [P.O.:880] Out: 1350 [Urine:1350] Intake/Output this shift: Total I/O In: -  Out: 400 [Urine:400]  General appearance: alert and cooperative Neurologic: intact Heart: regular rate and rhythm,  no murmur Lungs: diminished breath sounds bibasilar but o/w clear Abdomen: soft, non-tender; bowel sounds normal Extremities: moderate edema Wound: the sternotomy, right chest and right groin.incisions are dry and healing well  Lab Results: Recent Labs    03/26/20 0448  WBC 13.5*  HGB 8.1*  HCT 24.0*  PLT 347   BMET:  Recent Labs    03/26/20 0448  NA 135  K 4.1  CL 101  CO2 26  GLUCOSE 115*  BUN 21  CREATININE 1.56*  CALCIUM 8.0*    PT/INR:  Recent Labs    03/28/20 0323  LABPROT 14.9  INR 1.2   ABG    Component Value Date/Time   PHART 7.373 03/12/2020 1034   HCO3 26.1 03/12/2020 1034   TCO2 23 03/12/2020 1658   ACIDBASEDEF 2.0 03/12/2020 0909   O2SAT 68.9 03/15/2020 0455   CBG (last 3)  Recent Labs    03/27/20 1627 03/27/20 2115 03/28/20 0611  GLUCAP 92 89 86    Assessment/Plan: S/P Procedure(s) (LRB): SUBXYPHOID POST-OP PERICARDIAL FLUID  DRAINAGE WITH CHEST TUBE INSERTION. (N/A) CHEST TUBE INSERTION OF 28 BLAKE DRAIN. (N/A) TRANSESOPHAGEAL ECHOCARDIOGRAM (TEE) (N/A)  POD 16 MV repair and repair aortic dissection. POD 6 pericardial window  Hemodynamically stable in sinus rhythm, Will stop the midodrine since BP's 130 -150+. Continue amio po. Digoxin was discontinued.  Volume excess: wt is about 12 lbs over preop,  Continue lasix. Creat up slightly to 1.56 yesterday, no new lab today.  It was normal preop. Repeat lab in AM.   INR continues to trend down despite slow increase in dosing, 1.2 today.  Increase to 4mg  daily.   Acute postop blood loss anemia: transfused 1 unit 12/22 and had appropriate response with Hgb 7.1 to 8.1. Continue iron. Lab in AM.   Continue IS, ambulation  PT /OT recommending CIR,  awaiting decision.  LOS: 17 days    Malon Kindle [phone] 03/28/2020    Chart reviewed, patient examined, agree with above. He feels well overall. Limited echo today shows trivial pericardial effusion, EF 60-65% and intact MV repair with no MR.

## 2020-03-28 NOTE — Progress Notes (Signed)
Cardiology Progress Note  Patient ID: Patrick Bell MRN: [id number] DOB: [birth year] Date of Encounter: 03/28/2020  Primary Cardiologist: Shelva Majestic, MD  Subjective   Chief Complaint: mild chronic SOB  HPI: Reports weakness and fatigue.  Maintaining sinus rhythm.  Received transfusion 03/25/2020.  Hemoglobin today is pending.  INR 1.3.  ROS:  All other ROS reviewed and negative. Pertinent positives noted in the HPI.     Inpatient Medications  Scheduled Meds: . amiodarone  200 mg Oral Daily  . Chlorhexidine Gluconate Cloth  6 each Topical Daily  . docusate sodium  200 mg Oral Daily  . feeding supplement  237 mL Oral TID  . ferrous Q000111Q C-folic acid  1 capsule Oral Q breakfast  . furosemide  40 mg Oral BID  . insulin aspart  0-24 Units Subcutaneous TID AC & HS  . levothyroxine  25 mcg Oral Q0600  . mouth rinse  15 mL Mouth Rinse BID  . montelukast  10 mg Oral QHS  . pantoprazole  40 mg Oral QHS  . potassium chloride  20 mEq Oral BID  . simvastatin  10 mg Oral QHS  . sodium chloride flush  3 mL Intravenous Q12H  . warfarin  4 mg Oral q1600  . Warfarin - Physician Dosing Inpatient   Does not apply q1600   Continuous Infusions: . sodium chloride     PRN Meds: sodium chloride, albuterol, benzonatate, metoprolol tartrate, ondansetron (ZOFRAN) IV, oxyCODONE, sodium chloride flush, traMADol   Vital Signs   Vitals:   03/27/20 1941 03/27/20 2319 03/28/20 0311 03/28/20 0837  BP: (!) 155/65 130/62 (!) 142/66 (!) 152/62  Pulse: 80 80 78 79  Resp: 20 14 17 19   Temp: 98.5 F (36.9 C) 98.8 F (37.1 C) 98.5 F (36.9 C) 98.3 F (36.8 C)  TempSrc: Oral Oral Oral Oral  SpO2: 95%  91% 95%  Weight:   90.8 kg   Height:        Intake/Output Summary (Last 24 hours) at 03/28/2020 0958 Last data filed at 03/28/2020 0837 Gross per 24 hour  Intake 880 ml  Output 1625 ml  Net -745 ml   Last 3 Weights 03/28/2020 03/27/2020 03/26/2020  Weight (lbs) 200 lb 2.8 oz  197 lb 1.5 oz 201 lb 15.1 oz  Weight (kg) 90.8 kg 89.4 kg 91.6 kg   Telemetry   Overnight telemetry shows sinus rhythm with heart rate in the 70 bpm range, which I personally reviewed.   Physical Exam   Vitals:   03/27/20 1941 03/27/20 2319 03/28/20 0311 03/28/20 0837  BP: (!) 155/65 130/62 (!) 142/66 (!) 152/62  Pulse: 80 80 78 79  Resp: 20 14 17 19   Temp: 98.5 F (36.9 C) 98.8 F (37.1 C) 98.5 F (36.9 C) 98.3 F (36.8 C)  TempSrc: Oral Oral Oral Oral  SpO2: 95%  91% 95%  Weight:   90.8 kg   Height:        Intake/Output Summary (Last 24 hours) at 03/28/2020 0958 Last data filed at 03/28/2020 [id number] Gross per 24 hour  Intake 880 ml  Output 1625 ml  Net -745 ml    Last 3 Weights 03/28/2020 03/27/2020 03/26/2020  Weight (lbs) 200 lb 2.8 oz 197 lb 1.5 oz 201 lb 15.1 oz  Weight (kg) 90.8 kg 89.4 kg 91.6 kg    Body mass index is 28.72 kg/m.   General: Well nourished, well developed, in no acute distress Head: Atraumatic, normal size  Eyes: PEERLA, EOMI  Neck:  Supple, no JVD Endocrine: No thryomegaly Cardiac: Normal S1, S2; RRR; no murmurs, rubs, or gallops Lungs: Clear to auscultation bilaterally, no wheezing, rhonchi or rales  Abd: Soft, nontender, no hepatomegaly  Ext: Trace edema Musculoskeletal: No deformities, BUE and BLE strength normal and equal Skin: Midline surgical scar clean and dry without hematoma or evidence of infection Neuro: Alert and oriented to person, place, time, and situation, CNII-XII grossly intact, no focal deficits  Psych: Normal mood and affect   Labs   High Sensitivity Troponin:  No results for input(s): TROPONINIHS in the last 720 hours.   Cardiac EnzymesNo results for input(s): TROPONINI in the last 168 hours. No results for input(s): TROPIPOC in the last 168 hours.  Chemistry Recent Labs  Lab 03/21/20 1407 03/22/20 0111 03/23/20 0355 03/24/20 0436 03/25/20 0327 03/26/20 0448  NA  --    < > 132* 132* 133* 135  K  --    < > 4.4  4.1 3.6 4.1  CL  --    < > 98 95* 99 101  CO2  --    < > 27 27 26 26   GLUCOSE  --    < > 145* 92 102* 115*  BUN  --    < > 31* 30* 25* 21  CREATININE  --    < > 1.63* 1.47* 1.39* 1.56*  CALCIUM  --    < > 7.7* 7.7* 7.4* 8.0*  PROT  --   --  4.9*  --   --   --   ALBUMIN 2.1*  --  2.0*  --   --   --   AST  --   --  35  --   --   --   ALT  --   --  32  --   --   --   ALKPHOS  --   --  62  --   --   --   BILITOT  --   --  0.7  --   --   --   GFRNONAA  --    < > 42* 48* 51* 45*  ANIONGAP  --    < > 7 10 8 8    < > = values in this interval not displayed.    Hematology Recent Labs  Lab 03/24/20 0436 03/24/20 1235 03/25/20 0327 03/26/20 0448  WBC 14.8*  --  13.1* 13.5*  RBC 2.63*  --  2.36* 2.74*  HGB 7.8* 8.5* 7.1* 8.1*  HCT 23.8* 25.3* 21.1* 24.0*  MCV 90.5  --  89.4 87.6  MCH 29.7  --  30.1 29.6  MCHC 32.8  --  33.6 33.8  RDW 14.6  --  15.0 15.6*  PLT 319  --  311 347   BNPNo results for input(s): BNP, PROBNP in the last 168 hours.  DDimer No results for input(s): DDIMER in the last 168 hours.   Radiology  DG Chest 2 View  Result Date: 03/26/2020 CLINICAL DATA:  Pleural effusion EXAM: CHEST - 2 VIEW COMPARISON:  03/25/2020 FINDINGS: No significant interval change in chest radiographs with cardiomegaly and small bilateral pleural effusions. No new or focal airspace opacity. Left upper extremity PICC. Disc degenerative disease of the thoracic spine. IMPRESSION: No significant interval change in chest radiographs with cardiomegaly and small bilateral pleural effusions. No new or focal airspace opacity. Electronically Signed   By: Eddie Candle M.D.   On: 03/26/2020 14:26   Cardiac Studies  TTE 03/21/2020  1. There  is a large pericardial effusion present that present posterior  to the LV and surrounding the apex. It measures 2.2-2.6 cm posterior to  the LV and ~1.6 cm in the apical views. There is ~25% inspiratory  variation in the MV inflow. Due to  bandages, the IVC was not  visualized. The effusion is new since the last  echocardiogram. Due to hypotension, there are concerns for tamponade.  Clinical correlation recommended. Large pericardial effusion. The  pericardial effusion is posterior to the left  ventricle and surrounding the apex.  2. Left ventricular ejection fraction, by estimation, is 45 to 50%. The  left ventricle has mildly decreased function. The left ventricle  demonstrates global hypokinesis. Left ventricular diastolic function could  not be evaluated.  3. Right ventricular systolic function is normal. The right ventricular  size is normal.  4. MV repair with 32 mm sorin annuloplasty ring. MG 3.1 mmHG @ 98 bpm.  The mitral valve has been repaired/replaced. No evidence of mitral valve  regurgitation. There is a 32 mm prosthetic annuloplasty ring present in  the mitral position. Procedure Date:  03/11/2020.  5. The aortic valve is tricuspid. Aortic valve regurgitation is mild. No  aortic stenosis is present.  6. Ascending aorta repair. Measures 3.0 cm. Aortic root/ascending aorta  has been repaired/replaced.   Patient Profile  JOSAFAT ENRICO is a 79 y.o. male with hypertension, hyperlipidemia, asthma, severe mitral regurgitation status post MVR on 03/11/2020.  Cardiology was consulted on 03/21/2020 for atrial fibrillation.  He was found to have a large pericardial effusion.  He underwent pericardial window and evacuation on 03/22/2020.  Assessment & Plan   1.  Severe mitral regurgitation status post mitral valve repair -Doing well postop.  Course complicated by large pericardial effusion status post evacuation. -Continue with aggressive physical therapy and Occupational Therapy. -Continue Coumadin- LVEF 45-50%, add Toprol XL 25 mg po daily  2.  Postop A. Fib -Had brief postop A. fib after surgery.  And had persistent A. fib.  On 03/22/2019 when he was found to have a large pericardial effusion that was likely driving his atrial  fibrillation.  He is status post evacuation of the effusion.  Maintaining sinus rhythm. -Continue p.o. amnio., add Toprol XL 25 mg po daily  3.  Trace lower extremity edema -Diuresis per CT surgery  4.  Postop pericardial effusion -Status post drainage now with window.  5.  Postoperative anemia -Status post transfusion 03/25/2020.  Suspect this is all related to surgery.  May consider repeat limited echo just to ensure pericardial effusion has not returned.  6.  Hypothyroidism -TSH 6.5 with low T3.  Continue thyroid supplementation per surgery.  For questions or updates, please contact [place] Please consult [email or web address] for contact info under   Time Spent with Patient: I have spent a total of 25 minutes with patient reviewing hospital notes, telemetry, EKGs, labs and examining the patient as well as establishing an assessment and plan that was discussed with the patient.  > 50% of time was spent in direct patient care.    Signed, Ena Dawley, MD [place]  03/28/2020 9:58 AM

## 2020-03-29 ENCOUNTER — Inpatient Hospital Stay (HOSPITAL_COMMUNITY): Payer: Medicare Other

## 2020-03-29 DIAGNOSIS — I5033 Acute on chronic diastolic (congestive) heart failure: Secondary | ICD-10-CM

## 2020-03-29 LAB — CBC
HCT: 26.8 % — ABNORMAL LOW (ref 39.0–52.0)
Hemoglobin: 8.6 g/dL — ABNORMAL LOW (ref 13.0–17.0)
MCH: 28.7 pg (ref 26.0–34.0)
MCHC: 32.1 g/dL (ref 30.0–36.0)
MCV: 89.3 fL (ref 80.0–100.0)
Platelets: 388 10*3/uL (ref 150–400)
RBC: 3 MIL/uL — ABNORMAL LOW (ref 4.22–5.81)
RDW: 14.8 % (ref 11.5–15.5)
WBC: 8.1 10*3/uL (ref 4.0–10.5)
nRBC: 0 % (ref 0.0–0.2)

## 2020-03-29 LAB — BASIC METABOLIC PANEL
Anion gap: 9 (ref 5–15)
BUN: 18 mg/dL (ref 8–23)
CO2: 25 mmol/L (ref 22–32)
Calcium: 8.1 mg/dL — ABNORMAL LOW (ref 8.9–10.3)
Chloride: 102 mmol/L (ref 98–111)
Creatinine, Ser: 1.44 mg/dL — ABNORMAL HIGH (ref 0.61–1.24)
GFR, Estimated: 49 mL/min — ABNORMAL LOW (ref >60)
Glucose, Bld: 94 mg/dL (ref 70–99)
Potassium: 4.2 mmol/L (ref 3.5–5.1)
Sodium: 136 mmol/L (ref 135–145)

## 2020-03-29 LAB — GLUCOSE, CAPILLARY
Glucose-Capillary: 91 mg/dL (ref 70–99)
Glucose-Capillary: 109 mg/dL — ABNORMAL HIGH (ref 70–99)
Glucose-Capillary: 100 mg/dL — ABNORMAL HIGH (ref 70–99)
Glucose-Capillary: 142 mg/dL — ABNORMAL HIGH (ref 70–99)

## 2020-03-29 LAB — PROTIME-INR
INR: 1.3 — ABNORMAL HIGH (ref 0.8–1.2)
Prothrombin Time: 15.5 seconds — ABNORMAL HIGH (ref 11.4–15.2)

## 2020-03-29 MED ORDER — FUROSEMIDE 10 MG/ML IJ SOLN
40.0000 mg | Freq: Two times a day (BID) | INTRAMUSCULAR | Status: AC
  Administered 2020-03-29 (×2): 40 mg via INTRAVENOUS
  Filled 2020-03-29 (×2): qty 4

## 2020-03-29 NOTE — Progress Notes (Signed)
Cardiology Progress Note  Patient ID: NIXXON GILSTRAP MRN: [id number] DOB: 1940-09-03 Date of Encounter: 03/29/2020  Primary Cardiologist: Shelva Majestic, MD  Subjective   Chief Complaint: mild chronic SOB  HPI: Reports weakness and fatigue.  Maintaining sinus rhythm. Complains of significant B/L LE edema up to the ankles.  ROS:  All other ROS reviewed and negative. Pertinent positives noted in the HPI.     Inpatient Medications  Scheduled Meds: . amiodarone  200 mg Oral Daily  . Chlorhexidine Gluconate Cloth  6 each Topical Daily  . docusate sodium  200 mg Oral Daily  . feeding supplement  237 mL Oral TID  . ferrous Q000111Q C-folic acid  1 capsule Oral Q breakfast  . furosemide  40 mg Oral BID  . insulin aspart  0-24 Units Subcutaneous TID AC & HS  . levothyroxine  25 mcg Oral Q0600  . mouth rinse  15 mL Mouth Rinse BID  . metoprolol succinate  25 mg Oral Daily  . montelukast  10 mg Oral QHS  . pantoprazole  40 mg Oral QHS  . potassium chloride  20 mEq Oral BID  . simvastatin  10 mg Oral QHS  . sodium chloride flush  3 mL Intravenous Q12H  . warfarin  4 mg Oral q1600  . Warfarin - Physician Dosing Inpatient   Does not apply q1600   Continuous Infusions: . sodium chloride     PRN Meds: sodium chloride, albuterol, benzonatate, metoprolol tartrate, ondansetron (ZOFRAN) IV, oxyCODONE, sodium chloride flush, traMADol   Vital Signs   Vitals:   03/28/20 1634 03/28/20 1933 03/28/20 2348 03/29/20 0408  BP: 131/62 118/61 (!) 123/56 132/65  Pulse: 73 79 73 71  Resp: 20 (!) 22 (!) 22 20  Temp: 98 F (36.7 C) 98.3 F (36.8 C) 98.4 F (36.9 C) 98.4 F (36.9 C)  TempSrc: Oral Oral Oral Oral  SpO2: 96% 94% 97% 97%  Weight:    86.8 kg  Height:        Intake/Output Summary (Last 24 hours) at 03/29/2020 0843 Last data filed at 03/29/2020 0818 Gross per 24 hour  Intake 630 ml  Output 1500 ml  Net -870 ml   Last 3 Weights 03/29/2020 03/28/2020 03/27/2020   Weight (lbs) 191 lb 4.8 oz 200 lb 2.8 oz 197 lb 1.5 oz  Weight (kg) 86.773 kg 90.8 kg 89.4 kg   Telemetry   Overnight telemetry shows sinus rhythm with heart rate in the 70 bpm range, which I personally reviewed.   Physical Exam   Vitals:   03/28/20 1634 03/28/20 1933 03/28/20 2348 03/29/20 0408  BP: 131/62 118/61 (!) 123/56 132/65  Pulse: 73 79 73 71  Resp: 20 (!) 22 (!) 22 20  Temp: 98 F (36.7 C) 98.3 F (36.8 C) 98.4 F (36.9 C) 98.4 F (36.9 C)  TempSrc: Oral Oral Oral Oral  SpO2: 96% 94% 97% 97%  Weight:    86.8 kg  Height:         Intake/Output Summary (Last 24 hours) at 03/29/2020 0843 Last data filed at 03/29/2020 0818 Gross per 24 hour  Intake 630 ml  Output 1500 ml  Net -870 ml    Last 3 Weights 03/29/2020 03/28/2020 03/27/2020  Weight (lbs) 191 lb 4.8 oz 200 lb 2.8 oz 197 lb 1.5 oz  Weight (kg) 86.773 kg 90.8 kg 89.4 kg    Body mass index is 27.45 kg/m.   General: Well nourished, well developed, in no acute distress Head: Atraumatic,  normal size  Eyes: PEERLA, EOMI  Neck: Supple, no JVD Endocrine: No thryomegaly Cardiac: Normal S1, S2; RRR; no murmurs, rubs, or gallops Lungs: Clear to auscultation bilaterally, no wheezing, rhonchi or rales  Abd: Soft, nontender, no hepatomegaly  Ext: B/L 2+ up to the ankles edema Musculoskeletal: No deformities, BUE and BLE strength normal and equal Skin: Midline surgical scar clean and dry without hematoma or evidence of infection Neuro: Alert and oriented to person, place, time, and situation, CNII-XII grossly intact, no focal deficits  Psych: Normal mood and affect   Labs   High Sensitivity Troponin:  No results for input(s): TROPONINIHS in the last 720 hours.   Cardiac EnzymesNo results for input(s): TROPONINI in the last 168 hours. No results for input(s): TROPIPOC in the last 168 hours.  Chemistry Recent Labs  Lab 03/23/20 0355 03/24/20 0436 03/26/20 0448 03/28/20 1130 03/29/20 0500  NA 132*   < >  135 133* 136  K 4.4   < > 4.1 3.9 4.2  CL 98   < > 101 101 102  CO2 27   < > 26 24 25   GLUCOSE 145*   < > 115* 120* 94  BUN 31*   < > 21 16 18   CREATININE 1.63*   < > 1.56* 1.41* 1.44*  CALCIUM 7.7*   < > 8.0* 7.9* 8.1*  PROT 4.9*  --   --   --   --   ALBUMIN 2.0*  --   --   --   --   AST 35  --   --   --   --   ALT 32  --   --   --   --   ALKPHOS 62  --   --   --   --   BILITOT 0.7  --   --   --   --   GFRNONAA 42*   < > 45* 50* 49*  ANIONGAP 7   < > 8 8 9    < > = values in this interval not displayed.    Hematology Recent Labs  Lab 03/25/20 0327 03/26/20 0448 03/29/20 0500  WBC 13.1* 13.5* 8.1  RBC 2.36* 2.74* 3.00*  HGB 7.1* 8.1* 8.6*  HCT 21.1* 24.0* 26.8*  MCV 89.4 87.6 89.3  MCH 30.1 29.6 28.7  MCHC 33.6 33.8 32.1  RDW 15.0 15.6* 14.8  PLT 311 347 388   BNPNo results for input(s): BNP, PROBNP in the last 168 hours.  DDimer No results for input(s): DDIMER in the last 168 hours.   Radiology  DG Chest 2 View  Result Date: 03/29/2020 CLINICAL DATA:  Pleural effusion EXAM: CHEST - 2 VIEW COMPARISON:  Three days ago FINDINGS: Unchanged small bilateral pleural effusion. Stable heart size. Mitral valve repair. No pulmonary edema or pneumothorax. Left-sided PICC with tip at the upper cavoatrial junction. IMPRESSION: Small bilateral pleural effusion.  No pneumothorax. Electronically Signed   By: Monte Fantasia M.D.   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 Exam Location:  Inpatient Procedure: Limited Echo, Cardiac Doppler and Color Doppler Indications:    I31.3 Pericardial effusion  (noninflammatory)  History:        Patient has prior history of Echocardiogram examinations, most                 recent 03/21/2020. Aortic Dissection, Aortic Valve Disease,                 Mitral Valve Disease and Mitral Valve Prolapse; Risk                 Factors:Hypertension, Sleep Apnea and Dyslipidemia. Mitral valve                 repair. Aortic dissection repair. Pericaredial effusion.                  Mitral Valve: 32 mm prosthetic annuloplasty ring valve is                 present in the mitral position. Procedure Date: 03/11/2020.  Sonographer:    Roseanna Rainbow RDCS Referring Phys: [id number] [place]  1. Limited echo for pericardial effusion. A trivial effusion is now present. NSR is now present.  2. Left ventricular ejection fraction, by estimation, is 60 to 65%. The left ventricle has normal function.  3. Right ventricular systolic function is normal. The right ventricular size is normal.  4. The mitral valve has been repaired/replaced. No evidence of mitral valve regurgitation. The mean mitral valve gradient is 5.0 mmHg with average heart rate of 76 bpm. There is a 32 mm prosthetic annuloplasty ring present in the mitral position. Procedure Date: 03/11/2020.  5. The aortic valve is grossly normal.  6. The inferior vena cava is dilated in size with >50% respiratory variability, suggesting right atrial pressure of 8 mmHg. Comparison(s): Changes from prior study are noted. Effusion is now trivial. FINDINGS  Left Ventricle: Left ventricular ejection fraction, by estimation, is 60 to 65%. The left ventricle has normal function. Abnormal (paradoxical) septal motion consistent with post-operative status. Right Ventricle: The right ventricular size is normal. No increase in right ventricular wall thickness. Right ventricular systolic function is normal. Left Atrium: Left atrial size was normal in size. Right Atrium: Right atrial size was normal in size. Pericardium: Trivial pericardial effusion is  present. Presence of pericardial fat pad. Mitral Valve: The mitral valve has been repaired/replaced. There is a 32 mm prosthetic annuloplasty ring present in the mitral position. Procedure Date: 03/11/2020. MV peak gradient, 10.1 mmHg. The mean mitral valve gradient is 5.0 mmHg with average heart  rate of 76 bpm. Tricuspid Valve: The tricuspid valve is grossly normal. Tricuspid valve regurgitation is trivial. No evidence of tricuspid stenosis. Aortic Valve: The aortic valve is grossly normal. Aorta: The aortic root and ascending aorta are structurally normal, with no evidence of dilitation. Venous: The inferior vena cava is dilated in size with greater than 50% respiratory variability, suggesting right atrial pressure of 8 mmHg. IAS/Shunts: The atrial septum is grossly normal. Additional Comments: There is a small pleural effusion in the left lateral region.  LV Volumes (MOD) LV  vol d, MOD A2C: 98.1 ml LV vol d, MOD A4C: 85.9 ml LV vol s, MOD A2C: 37.4 ml LV vol s, MOD A4C: 32.8 ml LV SV MOD A2C:     60.7 ml LV SV MOD A4C:     85.9 ml LV SV MOD BP:      55.9 ml IVC IVC diam: 2.50 cm  AORTA Ao Asc diam: 2.90 cm MITRAL VALVE MV Peak grad: 10.1 mmHg MV Mean grad: 5.0 mmHg MV Vmax:      1.59 m/s MV Vmean:     105.0 cm/s Eleonore Chiquito MD Electronically signed by Eleonore Chiquito MD Signature Date/Time: 03/28/2020/4:40:44 PM    Final    Cardiac Studies  TTE 03/21/2020  1. There is a large pericardial effusion present that present posterior  to the LV and surrounding the apex. It measures 2.2-2.6 cm posterior to  the LV and ~1.6 cm in the apical views. There is ~25% inspiratory  variation in the MV inflow. Due to  bandages, the IVC was not visualized. The effusion is new since the last  echocardiogram. Due to hypotension, there are concerns for tamponade.  Clinical correlation recommended. Large pericardial effusion. The  pericardial effusion is posterior to the left  ventricle and surrounding the apex.  2.  Left ventricular ejection fraction, by estimation, is 45 to 50%. The  left ventricle has mildly decreased function. The left ventricle  demonstrates global hypokinesis. Left ventricular diastolic function could  not be evaluated.  3. Right ventricular systolic function is normal. The right ventricular  size is normal.  4. MV repair with 32 mm sorin annuloplasty ring. MG 3.1 mmHG @ 98 bpm.  The mitral valve has been repaired/replaced. No evidence of mitral valve  regurgitation. There is a 32 mm prosthetic annuloplasty ring present in  the mitral position. Procedure Date:  03/11/2020.  5. The aortic valve is tricuspid. Aortic valve regurgitation is mild. No  aortic stenosis is present.  6. Ascending aorta repair. Measures 3.0 cm. Aortic root/ascending aorta  has been repaired/replaced.   Patient Profile  Patrick Bell is a 79 y.o. male with hypertension, hyperlipidemia, asthma, severe mitral regurgitation status post MVR on 03/11/2020.  Cardiology was consulted on 03/21/2020 for atrial fibrillation.  He was found to have a large pericardial effusion.  He underwent pericardial window and evacuation on 03/22/2020.  Assessment & Plan   1.  Severe mitral regurgitation status post mitral valve repair -Doing well postop.  Course complicated by large pericardial effusion status post evacuation. -Continue with aggressive physical therapy and Occupational Therapy. -Continue Coumadin- LVEF 60-65% on echo on 12/25, continue Toprol XL 25 mg po daily  2.  Postop A. Fib -Had brief postop A. fib after surgery.  And had persistent A. fib.  On 03/22/2019 when he was found to have a large pericardial effusion that was likely driving his atrial fibrillation.  He is status post evacuation of the effusion.  Maintaining sinus rhythm with ventricular rates in 80'before/L -Continue p.o. amnio., add Toprol XL 25 mg po daily  3.  B/L lower extremity edema -start lasix 40 mg iv Q12H x 2 doses, apply  compression socks, advised to perform ankle pumps  4.  Postop pericardial effusion -Status post drainage now with window. Limited echo for pericardial effusion on 03/28/20 showed only trivial effusion.  5.  Postoperative anemia -Status post transfusion 03/25/2020.  Suspect this is all related to surgery.    6.  Hypothyroidism -TSH 6.5 with low T3.  Continue thyroid supplementation per surgery.  For questions or updates, please contact [place] Please consult [email or web address] for contact info under   Time Spent with Patient: I have spent a total of 25 minutes with patient reviewing hospital notes, telemetry, EKGs, labs and examining the patient as well as establishing an assessment and plan that was discussed with the patient.  > 50% of time was spent in direct patient care.    Signed, Ena Dawley, MD [place]  03/29/2020 8:43 AM

## 2020-03-29 NOTE — Progress Notes (Addendum)
7 Days Post-Op Procedure(s) (LRB): SUBXYPHOID POST-OP PERICARDIAL FLUID DRAINAGE WITH CHEST TUBE INSERTION. (N/A) CHEST TUBE INSERTION OF 28 BLAKE DRAIN. (N/A) TRANSESOPHAGEAL ECHOCARDIOGRAM (TEE) (N/A) Subjective:  Had a good day yesterday.  No new problems other than he is concerned about persistent swelling in his lower legs. Weight is about 3 kg above his admission weight On RA, good sats.   Objective: Vital signs in last 24 hours: Temp:  [97.8 F (36.6 C)-98.4 F (36.9 C)] 97.8 F (36.6 C) (12/26 0922) Pulse Rate:  [68-79] 77 (12/26 0922) Cardiac Rhythm: Normal sinus rhythm;Heart block (12/26 0736) Resp:  [20-22] 20 (12/26 0922) BP: (118-132)/(56-65) 123/58 (12/26 0922) SpO2:  [92 %-98 %] 92 % (12/26 0922) Weight:  [86.8 kg] 86.8 kg (12/26 0408)   Intake/Output from previous day: 12/25 0701 - 12/26 0700 In: 709 [P.O.:870] Out: 1500 [Urine:1500] Intake/Output this shift: Total I/O In: -  Out: 400 [Urine:400]  General appearance: alert and cooperative.  No dyspnea on room air Neurologic: intact Heart: regular rate and rhythm,  no murmur Lungs: diminished breath sounds bibasilar but o/w clear Abdomen: soft, non-tender; bowel sounds normal Extremities: moderate edema in the bilateral lower legs and ankles Wound: the sternotomy, right chest and right groin.incisions are dry and healing well  Lab Results: Recent Labs    03/29/20 0500  WBC 8.1  HGB 8.6*  HCT 26.8*  PLT 388   BMET:  Recent Labs    03/28/20 1130 03/29/20 0500  NA 133* 136  K 3.9 4.2  CL 101 102  CO2 24 25  GLUCOSE 120* 94  BUN 16 18  CREATININE 1.41* 1.44*  CALCIUM 7.9* 8.1*    PT/INR:  Recent Labs    03/29/20 0500  LABPROT 15.5*  INR 1.3*   ABG    Component Value Date/Time   PHART 7.373 03/12/2020 1034   HCO3 26.1 03/12/2020 1034   TCO2 23 03/12/2020 1658   ACIDBASEDEF 2.0 03/12/2020 0909   O2SAT 68.9 03/15/2020 0455   CBG (last 3)  Recent Labs    03/28/20 1634  03/28/20 2136 03/29/20 0548  GLUCAP 85 116* 91    Assessment/Plan: S/P Procedure(s) (LRB): SUBXYPHOID POST-OP PERICARDIAL FLUID DRAINAGE WITH CHEST TUBE INSERTION. (N/A) CHEST TUBE INSERTION OF 28 BLAKE DRAIN. (N/A) TRANSESOPHAGEAL ECHOCARDIOGRAM (TEE) (N/A)  POD 17 MV repair and repair aortic dissection. POD 7 pericardial window  Hemodynamically stable in sinus rhythm, blood pressure stable off midodrine continue amio po. Digoxin was discontinued.  Follow-up limited echo on 1225 showed normal EF of 60 to 65%; trace pericardial effusion; no mitral insufficienc and mean mitral valve gradient of 5 mmHg.  Volume excess: wt is improving, now about 3 kg above admission weight, IV Lasix twice daily x2 doses ordered by cardiology this morning.  Renal function is stable   Postoperative atrial fibrillation-maintaining sinus rhythm on oral amiodarone  INR 1.3 today.  Continue Coumadin dosing at 4 mg daily for now.    Acute postop blood loss anemia: transfused 1 unit 12/22 and had appropriate response with Hgb.  H&H are trending up.  Continue Trinsicon.   Continue IS, ambulation  PT /OT recommending CIR,  awaiting decision.  LOS: 18 days    Malon Kindle [phone] 03/29/2020   Chart reviewed, patient examined, agree with above. Concerned about swelling in legs this am but he has mild edema and says it is much better now. He is receiving lasix today per cardiology.  INR 1.3 and receiving Coumadin 4 mg today. Would  back off dose if INR makes any jump since he is on amio. Awaiting decision about CIR.

## 2020-03-29 NOTE — Progress Notes (Signed)
Mobility Specialist: Progress Note   03/29/20 1410  Mobility  Activity Ambulated in hall  Level of Assistance Contact guard assist, steadying assist  Assistive Device Front wheel walker  Distance Ambulated (ft) 470 ft  Mobility Response Tolerated fair  Mobility performed by Mobility specialist  $Mobility charge 1 Mobility   Pre-Mobility: 79 HR, 143/62 BP, 100% SpO2  Pt c/o low back pain during ambulation. Pt experienced LOB 226ft into ambulation with side step, RN notified. Pt recovered w/o assistance. Pt back to bed per request.   Harrell Gave Floree Zuniga Mobility Specialist

## 2020-03-30 LAB — BASIC METABOLIC PANEL
Anion gap: 10 (ref 5–15)
BUN: 19 mg/dL (ref 8–23)
CO2: 25 mmol/L (ref 22–32)
Calcium: 8.2 mg/dL — ABNORMAL LOW (ref 8.9–10.3)
Chloride: 101 mmol/L (ref 98–111)
Creatinine, Ser: 1.43 mg/dL — ABNORMAL HIGH (ref 0.61–1.24)
GFR, Estimated: 50 mL/min — ABNORMAL LOW (ref >60)
Glucose, Bld: 99 mg/dL (ref 70–99)
Potassium: 3.9 mmol/L (ref 3.5–5.1)
Sodium: 136 mmol/L (ref 135–145)

## 2020-03-30 LAB — GLUCOSE, CAPILLARY
Glucose-Capillary: 91 mg/dL (ref 70–99)
Glucose-Capillary: 83 mg/dL (ref 70–99)
Glucose-Capillary: 93 mg/dL (ref 70–99)

## 2020-03-30 LAB — CBC
HCT: 26.1 % — ABNORMAL LOW (ref 39.0–52.0)
Hemoglobin: 8.7 g/dL — ABNORMAL LOW (ref 13.0–17.0)
MCH: 29.2 pg (ref 26.0–34.0)
MCHC: 33.3 g/dL (ref 30.0–36.0)
MCV: 87.6 fL (ref 80.0–100.0)
Platelets: 380 10*3/uL (ref 150–400)
RBC: 2.98 MIL/uL — ABNORMAL LOW (ref 4.22–5.81)
RDW: 14.7 % (ref 11.5–15.5)
WBC: 7.7 10*3/uL (ref 4.0–10.5)
nRBC: 0 % (ref 0.0–0.2)

## 2020-03-30 LAB — PROTIME-INR
INR: 1.4 — ABNORMAL HIGH (ref 0.8–1.2)
Prothrombin Time: 16.1 seconds — ABNORMAL HIGH (ref 11.4–15.2)

## 2020-03-30 MED ORDER — FUROSEMIDE 40 MG PO TABS
40.0000 mg | ORAL_TABLET | Freq: Every day | ORAL | Status: DC
  Administered 2020-03-30 – 2020-03-31 (×2): 40 mg via ORAL
  Filled 2020-03-30 (×2): qty 1

## 2020-03-30 MED ORDER — WARFARIN SODIUM 5 MG PO TABS
5.0000 mg | ORAL_TABLET | Freq: Every day | ORAL | Status: DC
  Administered 2020-03-30: 18:00:00 5 mg via ORAL
  Filled 2020-03-30: qty 1

## 2020-03-30 NOTE — Care Management Important Message (Signed)
Important Message  Patient Details  Name: Patrick Bell MRN: [id number] Date of Birth: 08-15-1940   Medicare Important Message Given:  Yes     Shelda Altes 03/30/2020, 10:07 AM

## 2020-03-30 NOTE — Progress Notes (Signed)
Physical Therapy Treatment Patient Details Name: Patrick Bell MRN: [id number] DOB: 01/20/1941 Today's Date: 03/30/2020    History of Present Illness Patient is a 79 y/o male who presents s/p MVP and repair of acute ascending thoracic aortic dissection 12/8. Pericardial fluid drainage with chest tube placement on 12/19. PMH includes HTN.    PT Comments    Pt admitted with above diagnosis. Pt was able to ambulate with RW with min assist and cues for upright trunk as pt leans frequently and needs multiple standing rest breaks where he extends trunk for postural correction. Pt needs cues for pursed lip breathing as well. Pt is progressing but needs continued therapy due to fatigue.  Pt met 5/6 goals therefore goals revised today.  Continues to make progress.  Pt currently with functional limitations due to balance and endurance deficits. Pt will benefit from skilled PT to increase their independence and safety with mobility to allow discharge to the venue listed below.     Follow Up Recommendations  CIR;Supervision for mobility/OOB;Supervision/Assistance - 24 hour     Equipment Recommendations  Rolling walker with 5" wheels    Recommendations for Other Services Rehab consult     Precautions / Restrictions Precautions Precautions: Sternal;Fall Precaution Booklet Issued: Yes (comment) Precaution Comments: reviewed "move in the tube" and no pushing/pulling with UEs, Restrictions Other Position/Activity Restrictions: Sternal precautions    Mobility  Bed Mobility Overal bed mobility: Needs Assistance Bed Mobility: Rolling;Sidelying to Sit;Sit to Sidelying Rolling: Min guard Sidelying to sit: Min assist;HOB elevated     Sit to sidelying: Min assist General bed mobility comments: Assist with trunk to get to EOB, cues for technique; Cues to adhere to sternal precautions. assist for LES back into bed  Transfers Overall transfer level: Needs assistance Equipment used: Rolling walker  (2 wheeled) Transfers: Sit to/from Stand Sit to Stand: Min assist;Min guard         General transfer comment: Cues for hand placement on B knees to push into standing.  Pt frequently attempting to reach outside "the tube"  Ambulation/Gait Ambulation/Gait assistance: Min guard Gait Distance (Feet): 400 Feet Assistive device: Rolling walker (2 wheeled) Gait Pattern/deviations: Decreased stride length;Trunk flexed;Step-through pattern Gait velocity: decreased Gait velocity interpretation: <1.31 ft/sec, indicative of household ambulator General Gait Details: Slow, mostly steady gait with cues for RW proximity, upright posture and to relax traps; Multiple standing rest breaks; Sp02 93% on RA, cues for pursed lip breathing. Pt reports fatigue. Constant cues needed to stand up tall and extend shoulders   Stairs             Wheelchair Mobility    Modified Rankin (Stroke Patients Only)       Balance Overall balance assessment: Needs assistance Sitting-balance support: Feet supported;No upper extremity supported Sitting balance-Leahy Scale: Fair Sitting balance - Comments: Supervision for safety, prefers to lean on RW or legs for support.   Standing balance support: During functional activity Standing balance-Leahy Scale: Poor Standing balance comment: Requires UE support in standing.                            Cognition Arousal/Alertness: Awake/alert Behavior During Therapy: WFL for tasks assessed/performed Overall Cognitive Status: Within Functional Limits for tasks assessed                                 General Comments: appears [facility] for basic  mobility tasks.      Exercises      General Comments        Pertinent Vitals/Pain Pain Assessment: No/denies pain    Home Living                      Prior Function            PT Goals (current goals can now be found in the care plan section) Acute Rehab PT Goals PT Goal  Formulation: With patient Time For Goal Achievement: 04/13/20 Potential to Achieve Goals: Fair Progress towards PT goals: Progressing toward goals    Frequency    Min 3X/week      PT Plan Current plan remains appropriate    Co-evaluation              AM-PAC PT "6 Clicks" Mobility   Outcome Measure  Help needed turning from your back to your side while in a flat bed without using bedrails?: A Little Help needed moving from lying on your back to sitting on the side of a flat bed without using bedrails?: A Lot Help needed moving to and from a bed to a chair (including a wheelchair)?: A Little Help needed standing up from a chair using your arms (e.g., wheelchair or bedside chair)?: A Little Help needed to walk in hospital room?: A Little Help needed climbing 3-5 steps with a railing? : A Little 6 Click Score: 17    End of Session Equipment Utilized During Treatment: Gait belt Activity Tolerance: Patient tolerated treatment well Patient left: in bed;with call bell/phone within reach;with bed alarm set;with family/visitor present Nurse Communication: Mobility status PT Visit Diagnosis: Muscle weakness (generalized) (M62.81);Unsteadiness on feet (R26.81);Difficulty in walking, not elsewhere classified (R26.2);Dizziness and giddiness (R42)     Time: 5697-9480 PT Time Calculation (min) (ACUTE ONLY): 28 min  Charges:  $Gait Training: 23-37 mins                     Lorice Lafave W,PT [place] Pager:  [phone]  Office:  [place] 03/30/2020, 11:03 AM

## 2020-03-30 NOTE — Progress Notes (Signed)
Inpatient Rehab Admissions Coordinator:   Expedited appeal complete and insurance will now approve CIR. Unfortunately, I do not have a bed available for this patient to admit today.  I've let pt's wife know and we hope to be able to admit tomorrow, pending bed availability.    Estill Dooms, PT, DPT Admissions Coordinator [phone] 03/30/20  9:28 AM

## 2020-03-30 NOTE — Progress Notes (Signed)
Occupational Therapy Treatment Patient Details Name: Patrick Bell MRN: [id number] DOB: 22-Jun-1940 Today's Date: 03/30/2020    History of present illness Patient is a 79 y/o male who presents s/p MVP and repair of acute ascending thoracic aortic dissection 12/8. Pericardial fluid drainage with chest tube placement on 12/19. PMH includes HTN.   OT comments  Patient out of the ICU and up on a regular floor.  Patient is demonstrating improved bed mobility, better adherence to sternal precautions, and able to sit and practice use of hip kit this date.  Patient is hoping to go to CIR tomorrow, and continued ADL practice is needed.  Patient should do very well at CIR, but OT will continue to follow in acute.    Follow Up Recommendations  CIR    Equipment Recommendations  3 in 1 bedside commode    Recommendations for Other Services      Precautions / Restrictions Precautions Precautions: Sternal;Fall Precaution Comments: reviewed "move in the tube" and no hard pushing/pulling with UEs,       Mobility Bed Mobility       Sidelying to sit: Min assist;HOB elevated Supine to sit: Mod assist;HOB elevated        Transfers     Transfers: Sit to/from Stand Sit to Stand: Min assist                                                         ADL either performed or assessed with clinical judgement   ADL           Upper Body Bathing: Minimal assistance;Sitting   Lower Body Bathing: Minimal assistance;Sit to/from stand   Upper Body Dressing : Minimal assistance;Sitting   Lower Body Dressing: Moderate assistance;Sit to/from stand                                                                                                     General Comments   VSS    Pertinent Vitals/ Pain       Faces Pain Scale: Hurts little more Pain Location: incision on right, low back Pain Descriptors / Indicators:  Sore;Aching;Grimacing                                                          Frequency  Min 2X/week        Progress Toward Goals  OT Goals(current goals can now be found in the care plan section)  Progress towards OT goals: Progressing toward goals  Acute Rehab OT Goals Patient Stated Goal: I want to be able to walk in the woods again. OT Goal Formulation: With patient Time For Goal Achievement: 03/31/20 Potential to Achieve Goals: Good  Plan Discharge plan remains appropriate  Co-evaluation                 AM-PAC OT "6 Clicks" Daily Activity     Outcome Measure   Help from another person eating meals?: None Help from another person taking care of personal grooming?: A Little Help from another person toileting, which includes using toliet, bedpan, or urinal?: A Little Help from another person bathing (including washing, rinsing, drying)?: A Lot Help from another person to put on and taking off regular upper body clothing?: A Little Help from another person to put on and taking off regular lower body clothing?: A Lot 6 Click Score: 17    End of Session Equipment Utilized During Treatment: Rolling walker  OT Visit Diagnosis: Other abnormalities of gait and mobility (R26.89);Muscle weakness (generalized) (M62.81)   Activity Tolerance Patient tolerated treatment well   Patient Left with call bell/phone within reach;with nursing/sitter in room;in bed   Nurse Communication Mobility status        Time: 1829-9371 OT Time Calculation (min): 21 min  Charges: OT General Charges $OT Visit: 1 Visit OT Treatments $Self Care/Home Management : 8-22 mins  03/30/2020  Rich, OTR/L  Acute Rehabilitation Services  Office:  [phone]    Metta Clines 03/30/2020, 5:49 PM

## 2020-03-30 NOTE — Progress Notes (Addendum)
Progress Note  Patient Name: Patrick Bell Date of Encounter: 03/30/2020  Primary Cardiologist: Shelva Majestic, MD  Subjective   Feeling better day by day. Eager to go home, will be discharged to [facility] tomorrow tentatively. Has soreness where he has bruising from his prior chest tube site.  Inpatient Medications    Scheduled Meds: . amiodarone  200 mg Oral Daily  . Chlorhexidine Gluconate Cloth  6 each Topical Daily  . docusate sodium  200 mg Oral Daily  . feeding supplement  237 mL Oral TID  . ferrous [id number] C-folic acid  1 capsule Oral Q breakfast  . furosemide  40 mg Oral Daily  . levothyroxine  25 mcg Oral Q0600  . mouth rinse  15 mL Mouth Rinse BID  . metoprolol succinate  25 mg Oral Daily  . montelukast  10 mg Oral QHS  . pantoprazole  40 mg Oral QHS  . potassium chloride  20 mEq Oral BID  . simvastatin  10 mg Oral QHS  . sodium chloride flush  3 mL Intravenous Q12H  . warfarin  5 mg Oral q1600  . Warfarin - Physician Dosing Inpatient   Does not apply q1600   Continuous Infusions: . sodium chloride     PRN Meds: sodium chloride, albuterol, benzonatate, metoprolol tartrate, ondansetron (ZOFRAN) IV, oxyCODONE, sodium chloride flush, traMADol   Vital Signs    Vitals:   03/29/20 1950 03/29/20 2220 03/30/20 0310 03/30/20 0326  BP:  (!) 128/54 (!) 144/62   Pulse:  72 71   Resp:  20 15   Temp: 98.8 F (37.1 C) 98.4 F (36.9 C) 98.5 F (36.9 C)   TempSrc: Oral Oral Oral   SpO2:  96% 97%   Weight:    83.7 kg  Height:        Intake/Output Summary (Last 24 hours) at 03/30/2020 1012 Last data filed at 03/30/2020 0731 Gross per 24 hour  Intake 600 ml  Output 2200 ml  Net -1600 ml   Last 3 Weights 03/30/2020 03/29/2020 03/28/2020  Weight (lbs) 184 lb 8.4 oz 191 lb 4.8 oz 200 lb 2.8 oz  Weight (kg) 83.7 kg 86.773 kg 90.8 kg     Telemetry    NSR - Personally Reviewed  Physical Exam   GEN: No acute distress.  HEENT: Normocephalic,  atraumatic, sclera non-icteric. Neck: No JVD or bruits. Cardiac: RRR no murmurs, rubs, or gallops.  Respiratory: Clear to auscultation bilaterally. Breathing is unlabored. GI: Soft, nontender, non-distended, BS +x 4. MS: sternal wound c/d/i, chest tube site c/d/i with moderate ecchmosis down right flank appears in early stages of resolution with yellowing Extremities: No clubbing or cyanosis. Trace sockline edema. Distal pedal pulses are 2+ and equal bilaterally. Neuro:  AAOx3. Follows commands. Psych:  Responds to questions appropriately with a normal affect.  Labs    High Sensitivity Troponin:  No results for input(s): TROPONINIHS in the last 720 hours.    Cardiac EnzymesNo results for input(s): TROPONINI in the last 168 hours. No results for input(s): TROPIPOC in the last 168 hours.   Chemistry Recent Labs  Lab 03/28/20 1130 03/29/20 0500 03/30/20 0315  NA 133* 136 136  K 3.9 4.2 3.9  CL 101 102 101  CO2 24 25 25   GLUCOSE 120* 94 99  BUN 16 18 19   CREATININE 1.41* 1.44* 1.43*  CALCIUM 7.9* 8.1* 8.2*  GFRNONAA 50* 49* 50*  ANIONGAP 8 9 10      Hematology Recent Labs  Lab 03/26/20  0448 03/29/20 0500 03/30/20 0315  WBC 13.5* 8.1 7.7  RBC 2.74* 3.00* 2.98*  HGB 8.1* 8.6* 8.7*  HCT 24.0* 26.8* 26.1*  MCV 87.6 89.3 87.6  MCH 29.6 28.7 29.2  MCHC 33.8 32.1 33.3  RDW 15.6* 14.8 14.7  PLT 347 388 380    BNPNo results for input(s): BNP, PROBNP in the last 168 hours.   DDimer No results for input(s): DDIMER in the last 168 hours.   Radiology    DG Chest 2 View  Result Date: 03/29/2020 CLINICAL DATA:  Pleural effusion EXAM: CHEST - 2 VIEW COMPARISON:  Three days ago FINDINGS: Unchanged small bilateral pleural effusion. Stable heart size. Mitral valve repair. No pulmonary edema or pneumothorax. Left-sided PICC with tip at the upper cavoatrial junction. IMPRESSION: Small bilateral pleural effusion.  No pneumothorax. Electronically Signed   By: Monte Fantasia M.D.    On: 03/29/2020 06:17   ECHOCARDIOGRAM LIMITED  Result Date: 03/28/2020    ECHOCARDIOGRAM LIMITED REPORT   Patient Name:   Patrick Bell Date of Exam: 03/28/2020 Medical Rec #:  [id number]       Height:       70.0 in Accession #:    [id number]      Weight:       200.2 lb Date of Birth:  1940-06-19       BSA:          2.088 m Patient Age:    79 years        BP:           126/63 mmHg Patient Gender: M               HR:           77 bpm. Exam Location:  Inpatient Procedure: Limited Echo, Cardiac Doppler and Color Doppler Indications:    I31.3 Pericardial effusion (noninflammatory)  History:        Patient has prior history of Echocardiogram examinations, most                 recent 03/21/2020. Aortic Dissection, Aortic Valve Disease,                 Mitral Valve Disease and Mitral Valve Prolapse; Risk                 Factors:Hypertension, Sleep Apnea and Dyslipidemia. Mitral valve                 repair. Aortic dissection repair. Pericaredial effusion.                  Mitral Valve: 32 mm prosthetic annuloplasty ring valve is                 present in the mitral position. Procedure Date: 03/11/2020.  Sonographer:    Roseanna Rainbow RDCS Referring Phys: [id number] [place]  1. Limited echo for pericardial effusion. A trivial effusion is now present. NSR is now present.  2. Left ventricular ejection fraction, by estimation, is 60 to 65%. The left ventricle has normal function.  3. Right ventricular systolic function is normal. The right ventricular size is normal.  4. The mitral valve has been repaired/replaced. No evidence of mitral valve regurgitation. The mean mitral valve gradient is 5.0 mmHg with average heart rate of 76 bpm. There is a 32 mm prosthetic annuloplasty ring present in the mitral position. Procedure Date: 03/11/2020.  5. The aortic valve is grossly normal.  6.  The inferior vena cava is dilated in size with >50% respiratory variability, suggesting right atrial pressure of 8 mmHg.  Comparison(s): Changes from prior study are noted. Effusion is now trivial. FINDINGS  Left Ventricle: Left ventricular ejection fraction, by estimation, is 60 to 65%. The left ventricle has normal function. Abnormal (paradoxical) septal motion consistent with post-operative status. Right Ventricle: The right ventricular size is normal. No increase in right ventricular wall thickness. Right ventricular systolic function is normal. Left Atrium: Left atrial size was normal in size. Right Atrium: Right atrial size was normal in size. Pericardium: Trivial pericardial effusion is present. Presence of pericardial fat pad. Mitral Valve: The mitral valve has been repaired/replaced. There is a 32 mm prosthetic annuloplasty ring present in the mitral position. Procedure Date: 03/11/2020. MV peak gradient, 10.1 mmHg. The mean mitral valve gradient is 5.0 mmHg with average heart  rate of 76 bpm. Tricuspid Valve: The tricuspid valve is grossly normal. Tricuspid valve regurgitation is trivial. No evidence of tricuspid stenosis. Aortic Valve: The aortic valve is grossly normal. Aorta: The aortic root and ascending aorta are structurally normal, with no evidence of dilitation. Venous: The inferior vena cava is dilated in size with greater than 50% respiratory variability, suggesting right atrial pressure of 8 mmHg. IAS/Shunts: The atrial septum is grossly normal. Additional Comments: There is a small pleural effusion in the left lateral region.  LV Volumes (MOD) LV vol d, MOD A2C: 98.1 ml LV vol d, MOD A4C: 85.9 ml LV vol s, MOD A2C: 37.4 ml LV vol s, MOD A4C: 32.8 ml LV SV MOD A2C:     60.7 ml LV SV MOD A4C:     85.9 ml LV SV MOD BP:      55.9 ml IVC IVC diam: 2.50 cm  AORTA Ao Asc diam: 2.90 cm MITRAL VALVE MV Peak grad: 10.1 mmHg MV Mean grad: 5.0 mmHg MV Vmax:      1.59 m/s MV Vmean:     105.0 cm/s Lennie Odor MD Electronically signed by Lennie Odor MD Signature Date/Time: 03/28/2020/4:40:44 PM    Final     Cardiac Studies    Limited echo 03/21/20 1. There is a large pericardial effusion present that present posterior  to the LV and surrounding the apex. It measures 2.2-2.6 cm posterior to  the LV and ~1.6 cm in the apical views. There is ~25% inspiratory  variation in the MV inflow. Due to  bandages, the IVC was not visualized. The effusion is new since the last  echocardiogram. Due to hypotension, there are concerns for tamponade.  Clinical correlation recommended. Large pericardial effusion. The  pericardial effusion is posterior to the left  ventricle and surrounding the apex.  2. Left ventricular ejection fraction, by estimation, is 45 to 50%. The  left ventricle has mildly decreased function. The left ventricle  demonstrates global hypokinesis. Left ventricular diastolic function could  not be evaluated.  3. Right ventricular systolic function is normal. The right ventricular  size is normal.  4. MV repair with 32 mm sorin annuloplasty ring. MG 3.1 mmHG @ 98 bpm.  The mitral valve has been repaired/replaced. No evidence of mitral valve  regurgitation. There is a 32 mm prosthetic annuloplasty ring present in  the mitral position. Procedure Date:  03/11/2020.  5. The aortic valve is tricuspid. Aortic valve regurgitation is mild. No  aortic stenosis is present.  6. Ascending aorta repair. Measures 3.0 cm. Aortic root/ascending aorta  has been repaired/replaced.   Comparison(s): Changes from  prior study are noted.   Limited echo 03/28/20 Post window IMPRESSIONS  1. Limited echo for pericardial effusion. A trivial effusion is now  present. NSR is now present.  2. Left ventricular ejection fraction, by estimation, is 60 to 65%. The  left ventricle has normal function.  3. Right ventricular systolic function is normal. The right ventricular  size is normal.  4. The mitral valve has been repaired/replaced. No evidence of mitral  valve regurgitation. The mean mitral valve gradient is 5.0  mmHg with  average heart rate of 76 bpm. There is a 32 mm prosthetic annuloplasty  ring present in the mitral position.  Procedure Date: 03/11/2020.  5. The aortic valve is grossly normal.  6. The inferior vena cava is dilated in size with >50% respiratory  variability, suggesting right atrial pressure of 8 mmHg.   Comparison(s): Changes from prior study are noted. Effusion is now  trivial.   Patient Profile     79 y.o. male with  hypertension, hyperlipidemia, asthma, OSA (followed by Dr. Claiborne Billings), normal coronary arteries and severe mitral regurgitation. He was admitted for planned MVR on 03/11/2020 with Complex valvuloplasty including artificial Gore-tex neochord placement x4, suture plication of posterior leaflet and posteromedial commissure, Sorin Memo 4D Ring Annuloplasty (size 57mm, catalog # [id number], serial # [id number]), complicated by acute type A aortic dissection upon initiation of cardiopulmonary bypass. He underwent intra-op resuspension of native aortic valve and supracoronary straight graft repair of ascending aorta (Hemashield Platinum size 26 and 28 mm). Cardiology was consulted on 03/21/2020 for atrial fibrillation at which time he was found to have a large pericardial effusion.  He underwent pericardial window and evacuation on 03/22/2020.  Assessment & Plan    1. Severe mitral valve regurgitation s/p MV repair and repair of acute type A aortic dissection - continues with PT/OT with plan for CIR - on Coumadin post-operatively  2. Post-op atrial fib - felt likely driven by large pericardial effusion - maintaining NSR - continue amiodarone 200mg  daily, metoprolol 25mg  daily - may be able to come off amiodarone as OP  - anticoagulated with Coumadin for now  3. Bilateral lower extremity edema - on Lasix/KCl with stable Cr/K - BMET in AM  4. Post-op pericardial effusion - s/p window with limited echo 03/28/20 showing trivial effusion - follow bruising at prior chest tube  site  5. Post-op anemia felt related to surgery - managed by surgery, Hgb stable compared to yesterday at 8.7  6. Hypothyroidism - TSH 6.5, low T3, normal free T4, started on supplementation per surgery - will need close f/u labs as OP  7. Acute renal insufficiency - pre-op Cr 0.8, has been hovering 1.3-1.4 range and now stable post-operatively which may reflect new baseline  Coumadin will need to be followed at CIR prior to DC home. He originally had f/u scheduled in our office tomorrow which I have moved out to 04/21/19 with Almyra Deforest PA-C. Tentatively he has f/u echo scheduled for 04/29/19. We'll plan to follow at [facility] as well - sent message to cardmaster box to make sure he does not fall off card.  For questions or updates, please contact [place] Please consult [email or web address] for contact info under Cardiology/STEMI.  Signed, Charlie Pitter, PA-C 03/30/2020, 10:12 AM    History and all data above reviewed.  Patient examined.  I agree with the findings as above.  The patient exam reveals COR:RRR, no rubs  ,  Lungs: Decreased breath sounds at the bases  ,  Abd: Positive bowel sounds, no rebound no guarding, Ext Trace edema  .  All available labs, radiology testing, previous records reviewed. Agree with documented assessment and plan.   Atrial fib:  On amio and warfarin.   Follow up will be planned based on timing of discharge from rehab.   BP has been creeping up.  Was previously on ARB.  However, given AKI with this hospitalization might consider start amlodipine if BP continues to trend up.  I will ask Dr. Claiborne Billings to follow this week in rehab.    Jeneen Rinks Dionisio Aragones  11:13 AM  03/30/2020

## 2020-03-30 NOTE — Progress Notes (Signed)
CARDIAC REHAB PHASE I   PRE:  Rate/Rhythm: 77 SR  BP:  Sitting: 100/74      SaO2: 95 RA  MODE:  Ambulation: 250 ft   POST:  Rate/Rhythm: 78 SR  BP:  Sitting: in BR   Pt ambulated 232ft in hallway assist of one with front wheel walker. Pt denies CP, does endorse L calf pain, and minimal dizziness. RN made aware. Pt returned to BR. BR call light within reach. Reinforced importance of ambulation, IS use, and sternal precautions. Will continue to follow.  [id number] Reynold Bowen, RN BSN 03/30/2020 1:32 PM

## 2020-03-30 NOTE — Progress Notes (Addendum)
Mobility Specialist - Progress Note   03/30/20 1526  Mobility  Activity Ambulated in hall  Level of Assistance Minimal assist, patient does 75% or more  Assistive Device Front wheel walker  Distance Ambulated (ft) 470 ft (Intervals: 300 ft x 1, 170 ft x 1)  Mobility Response Tolerated well  Mobility performed by Mobility specialist  $Mobility charge 1 Mobility   Pt min assist for balance when standing, otherwise standby assist. HR in 70s throughout. He required one seated rest break due to lower back pain. RW height adjusted in order to reduce his amount of leaning. Back in bed after walk.   Mamie Levers Mobility Specialist Mobility Specialist Phone: [phone]

## 2020-03-30 NOTE — Progress Notes (Addendum)
      [address]       [place] [address]             [phone]      8 Days Post-Op Procedure(s) (LRB): SUBXYPHOID POST-OP PERICARDIAL FLUID DRAINAGE WITH CHEST TUBE INSERTION. (N/A) CHEST TUBE INSERTION OF 28 BLAKE DRAIN. (N/A) TRANSESOPHAGEAL ECHOCARDIOGRAM (TEE) (N/A)   Subjective:  Awoken from sleep.  States its early.  Otherwise no new complaints.   Objective: Vital signs in last 24 hours: Temp:  [97.8 F (36.6 C)-98.8 F (37.1 C)] 98.5 F (36.9 C) (12/27 0310) Pulse Rate:  [71-77] 71 (12/27 0310) Cardiac Rhythm: Normal sinus rhythm;Heart block (12/26 1941) Resp:  [15-20] 15 (12/27 0310) BP: (122-144)/(54-62) 144/62 (12/27 0310) SpO2:  [92 %-97 %] 97 % (12/27 0310) Weight:  [83.7 kg] 83.7 kg (12/27 0326)  Intake/Output from previous day: 12/26 0701 - 12/27 0700 In: 840 [P.O.:840] Out: 1900 [Urine:1900]  General appearance: alert, cooperative and no distress Heart: regular rate and rhythm Lungs: clear to auscultation bilaterally Abdomen: soft, non-tender; bowel sounds normal; no masses,  no organomegaly Extremities: edema trace Wound: clean and dry  Lab Results: Recent Labs    03/29/20 0500 03/30/20 0315  WBC 8.1 7.7  HGB 8.6* 8.7*  HCT 26.8* 26.1*  PLT 388 380   BMET:  Recent Labs    03/29/20 0500 03/30/20 0315  NA 136 136  K 4.2 3.9  CL 102 101  CO2 25 25  GLUCOSE 94 99  BUN 18 19  CREATININE 1.44* 1.43*  CALCIUM 8.1* 8.2*    PT/INR:  Recent Labs    03/30/20 0315  LABPROT 16.1*  INR 1.4*   ABG    Component Value Date/Time   PHART 7.373 03/12/2020 1034   HCO3 26.1 03/12/2020 1034   TCO2 23 03/12/2020 1658   ACIDBASEDEF 2.0 03/12/2020 0909   O2SAT 68.9 03/15/2020 0455   CBG (last 3)  Recent Labs    03/29/20 1630 03/29/20 2148 03/30/20 0547  GLUCAP 100* 142* 91    Assessment/Plan: S/P Procedure(s) (LRB): SUBXYPHOID POST-OP PERICARDIAL FLUID DRAINAGE WITH CHEST TUBE INSERTION. (N/A) CHEST TUBE  INSERTION OF 28 BLAKE DRAIN. (N/A) TRANSESOPHAGEAL ECHOCARDIOGRAM (TEE) (N/A)  1. CV-PAF, in NSR, SBP in the 140s- weaned off Midodrine yesterday, continue Toprol XL, Amiodarone 2. INR 1.4, minimal response to multiple doses of coumadin, will increase to 5 mg today 3. Renal- creatinine has been stable, weight/edema is improving, will transition to oral Lasix 40 mg daily, on potassium supplement will continue 4. Expected post operative blood loss anemia, Hgb stable at 8.7 5. Deconditioning- PT/OT recs CIR, awaiting insurance/bed offer 6. Dispo- patient stable, continue current care, is likely medically stable for discharge to CIR if bed approval can be obtained   LOS: 19 days    Lowella Dandy, PA-C  03/30/2020    I have seen and examined the patient and agree with the assessment and plan as outlined.  Making slow but steady progress.  Maintaining NSR.  Weight now down to preop baseline.  Renal function stable.  Ready for d/c to inpatient rehab service.  Purcell Nails, MD 03/30/2020 8:46 AM

## 2020-03-31 ENCOUNTER — Encounter (HOSPITAL_COMMUNITY): Payer: Self-pay | Admitting: Physical Medicine & Rehabilitation

## 2020-03-31 ENCOUNTER — Other Ambulatory Visit: Payer: Self-pay

## 2020-03-31 ENCOUNTER — Ambulatory Visit: Payer: Medicare Other | Admitting: Cardiology

## 2020-03-31 ENCOUNTER — Inpatient Hospital Stay (HOSPITAL_COMMUNITY)
Admission: RE | Admit: 2020-03-31 | Discharge: 2020-04-07 | DRG: 945 | Disposition: A | Discharge status: Home / Self Care | Payer: Medicare Other | Source: Intra-hospital | Attending: Physical Medicine & Rehabilitation | Admitting: Physical Medicine & Rehabilitation

## 2020-03-31 DIAGNOSIS — R531 Weakness: Secondary | ICD-10-CM | POA: Diagnosis present

## 2020-03-31 DIAGNOSIS — Z9889 Other specified postprocedural states: Secondary | ICD-10-CM | POA: Diagnosis not present

## 2020-03-31 DIAGNOSIS — N179 Acute kidney failure, unspecified: Secondary | ICD-10-CM | POA: Diagnosis not present

## 2020-03-31 DIAGNOSIS — Z952 Presence of prosthetic heart valve: Secondary | ICD-10-CM

## 2020-03-31 DIAGNOSIS — Z79891 Long term (current) use of opiate analgesic: Secondary | ICD-10-CM

## 2020-03-31 DIAGNOSIS — E440 Moderate protein-calorie malnutrition: Secondary | ICD-10-CM | POA: Diagnosis present

## 2020-03-31 DIAGNOSIS — I10 Essential (primary) hypertension: Secondary | ICD-10-CM | POA: Diagnosis not present

## 2020-03-31 DIAGNOSIS — Z6824 Body mass index (BMI) 24.0-24.9, adult: Secondary | ICD-10-CM

## 2020-03-31 DIAGNOSIS — R5381 Other malaise: Secondary | ICD-10-CM | POA: Diagnosis not present

## 2020-03-31 DIAGNOSIS — K219 Gastro-esophageal reflux disease without esophagitis: Secondary | ICD-10-CM | POA: Diagnosis not present

## 2020-03-31 DIAGNOSIS — Z79899 Other long term (current) drug therapy: Secondary | ICD-10-CM

## 2020-03-31 DIAGNOSIS — E782 Mixed hyperlipidemia: Secondary | ICD-10-CM

## 2020-03-31 DIAGNOSIS — I480 Paroxysmal atrial fibrillation: Secondary | ICD-10-CM | POA: Diagnosis not present

## 2020-03-31 DIAGNOSIS — Z7901 Long term (current) use of anticoagulants: Secondary | ICD-10-CM | POA: Diagnosis not present

## 2020-03-31 DIAGNOSIS — R0789 Other chest pain: Secondary | ICD-10-CM | POA: Diagnosis not present

## 2020-03-31 DIAGNOSIS — G4733 Obstructive sleep apnea (adult) (pediatric): Secondary | ICD-10-CM | POA: Diagnosis not present

## 2020-03-31 DIAGNOSIS — Z713 Dietary counseling and surveillance: Secondary | ICD-10-CM

## 2020-03-31 DIAGNOSIS — E46 Unspecified protein-calorie malnutrition: Secondary | ICD-10-CM | POA: Diagnosis not present

## 2020-03-31 DIAGNOSIS — R269 Unspecified abnormalities of gait and mobility: Secondary | ICD-10-CM | POA: Diagnosis present

## 2020-03-31 DIAGNOSIS — E8809 Other disorders of plasma-protein metabolism, not elsewhere classified: Secondary | ICD-10-CM | POA: Diagnosis not present

## 2020-03-31 DIAGNOSIS — E8770 Fluid overload, unspecified: Secondary | ICD-10-CM | POA: Diagnosis present

## 2020-03-31 DIAGNOSIS — E785 Hyperlipidemia, unspecified: Secondary | ICD-10-CM | POA: Diagnosis not present

## 2020-03-31 DIAGNOSIS — R600 Localized edema: Secondary | ICD-10-CM

## 2020-03-31 DIAGNOSIS — M791 Myalgia, unspecified site: Secondary | ICD-10-CM

## 2020-03-31 DIAGNOSIS — Z79890 Hormone replacement therapy (postmenopausal): Secondary | ICD-10-CM

## 2020-03-31 DIAGNOSIS — J4520 Mild intermittent asthma, uncomplicated: Secondary | ICD-10-CM

## 2020-03-31 DIAGNOSIS — R630 Anorexia: Secondary | ICD-10-CM | POA: Diagnosis present

## 2020-03-31 DIAGNOSIS — Z8249 Family history of ischemic heart disease and other diseases of the circulatory system: Secondary | ICD-10-CM | POA: Diagnosis not present

## 2020-03-31 DIAGNOSIS — Z87891 Personal history of nicotine dependence: Secondary | ICD-10-CM

## 2020-03-31 DIAGNOSIS — E039 Hypothyroidism, unspecified: Secondary | ICD-10-CM

## 2020-03-31 DIAGNOSIS — I951 Orthostatic hypotension: Secondary | ICD-10-CM | POA: Diagnosis not present

## 2020-03-31 DIAGNOSIS — E876 Hypokalemia: Secondary | ICD-10-CM

## 2020-03-31 DIAGNOSIS — Z5329 Procedure and treatment not carried out because of patient's decision for other reasons: Secondary | ICD-10-CM | POA: Diagnosis not present

## 2020-03-31 DIAGNOSIS — R791 Abnormal coagulation profile: Secondary | ICD-10-CM

## 2020-03-31 DIAGNOSIS — R42 Dizziness and giddiness: Secondary | ICD-10-CM | POA: Diagnosis present

## 2020-03-31 DIAGNOSIS — R030 Elevated blood-pressure reading, without diagnosis of hypertension: Secondary | ICD-10-CM

## 2020-03-31 DIAGNOSIS — T501X5A Adverse effect of loop [high-ceiling] diuretics, initial encounter: Secondary | ICD-10-CM | POA: Diagnosis present

## 2020-03-31 DIAGNOSIS — D62 Acute posthemorrhagic anemia: Secondary | ICD-10-CM

## 2020-03-31 DIAGNOSIS — R5383 Other fatigue: Secondary | ICD-10-CM | POA: Diagnosis present

## 2020-03-31 DIAGNOSIS — R609 Edema, unspecified: Secondary | ICD-10-CM

## 2020-03-31 LAB — BASIC METABOLIC PANEL
Anion gap: 8 (ref 5–15)
BUN: 18 mg/dL (ref 8–23)
CO2: 25 mmol/L (ref 22–32)
Calcium: 8.1 mg/dL — ABNORMAL LOW (ref 8.9–10.3)
Chloride: 101 mmol/L (ref 98–111)
Creatinine, Ser: 1.41 mg/dL — ABNORMAL HIGH (ref 0.61–1.24)
GFR, Estimated: 50 mL/min — ABNORMAL LOW (ref >60)
Glucose, Bld: 96 mg/dL (ref 70–99)
Potassium: 3.9 mmol/L (ref 3.5–5.1)
Sodium: 134 mmol/L — ABNORMAL LOW (ref 135–145)

## 2020-03-31 LAB — CBC
HCT: 24.2 % — ABNORMAL LOW (ref 39.0–52.0)
Hemoglobin: 8.1 g/dL — ABNORMAL LOW (ref 13.0–17.0)
MCH: 29.5 pg (ref 26.0–34.0)
MCHC: 33.5 g/dL (ref 30.0–36.0)
MCV: 88 fL (ref 80.0–100.0)
Platelets: 360 10*3/uL (ref 150–400)
RBC: 2.75 MIL/uL — ABNORMAL LOW (ref 4.22–5.81)
RDW: 14.6 % (ref 11.5–15.5)
WBC: 7.2 10*3/uL (ref 4.0–10.5)
nRBC: 0 % (ref 0.0–0.2)

## 2020-03-31 LAB — PROTIME-INR
INR: 1.6 — ABNORMAL HIGH (ref 0.8–1.2)
Prothrombin Time: 18.8 seconds — ABNORMAL HIGH (ref 11.4–15.2)

## 2020-03-31 LAB — GLUCOSE, CAPILLARY: Glucose-Capillary: 79 mg/dL (ref 70–99)

## 2020-03-31 MED ORDER — WARFARIN - PHARMACIST DOSING INPATIENT: Freq: Every day | Status: DC

## 2020-03-31 MED ORDER — PANTOPRAZOLE SODIUM 40 MG PO TBEC
40.0000 mg | DELAYED_RELEASE_TABLET | Freq: Every day | ORAL | Status: DC
  Administered 2020-03-31 – 2020-04-06 (×7): 40 mg via ORAL
  Filled 2020-03-31 (×7): qty 1

## 2020-03-31 MED ORDER — TRAMADOL HCL 50 MG PO TABS: 50.0000 mg | ORAL_TABLET | Freq: Four times a day (QID) | ORAL | Status: DC | PRN

## 2020-03-31 MED ORDER — LEVOTHYROXINE SODIUM 25 MCG PO TABS
25.0000 ug | ORAL_TABLET | Freq: Every day | ORAL | Status: DC
  Administered 2020-04-01 – 2020-04-07 (×7): 25 ug via ORAL
  Filled 2020-03-31 (×7): qty 1

## 2020-03-31 MED ORDER — ORAL CARE MOUTH RINSE
15.0000 mL | Freq: Two times a day (BID) | OROMUCOSAL | Status: DC
  Administered 2020-03-31 – 2020-04-04 (×6): 15 mL via OROMUCOSAL

## 2020-03-31 MED ORDER — FUROSEMIDE 40 MG PO TABS: 40.0000 mg | ORAL_TABLET | Freq: Every day | ORAL | Status: DC

## 2020-03-31 MED ORDER — WARFARIN SODIUM 3 MG PO TABS
3.0000 mg | ORAL_TABLET | Freq: Once | ORAL | Status: AC
  Administered 2020-03-31: 19:00:00 3 mg via ORAL
  Filled 2020-03-31: qty 1

## 2020-03-31 MED ORDER — PROCHLORPERAZINE 25 MG RE SUPP: 12.5000 mg | Freq: Four times a day (QID) | RECTAL | Status: DC | PRN

## 2020-03-31 MED ORDER — GUAIFENESIN-DM 100-10 MG/5ML PO SYRP: 5.0000 mL | ORAL_SOLUTION | Freq: Four times a day (QID) | ORAL | Status: DC | PRN

## 2020-03-31 MED ORDER — LEVOTHYROXINE SODIUM 25 MCG PO TABS: 25.0000 ug | ORAL_TABLET | Freq: Every day | ORAL | 1 refills | Status: DC

## 2020-03-31 MED ORDER — WARFARIN SODIUM 3 MG PO TABS: 3.0000 mg | ORAL_TABLET | Freq: Every day | ORAL | 1 refills | Status: DC

## 2020-03-31 MED ORDER — METOPROLOL SUCCINATE ER 25 MG PO TB24
25.0000 mg | ORAL_TABLET | Freq: Every day | ORAL | Status: DC
  Administered 2020-04-01 – 2020-04-07 (×7): 25 mg via ORAL
  Filled 2020-03-31 (×8): qty 1

## 2020-03-31 MED ORDER — WARFARIN - PHYSICIAN DOSING INPATIENT: Freq: Every day | Status: DC

## 2020-03-31 MED ORDER — AMIODARONE HCL 200 MG PO TABS
200.0000 mg | ORAL_TABLET | Freq: Every day | ORAL | Status: DC
  Administered 2020-04-01 – 2020-04-07 (×7): 200 mg via ORAL
  Filled 2020-03-31 (×7): qty 1

## 2020-03-31 MED ORDER — METOPROLOL SUCCINATE ER 25 MG PO TB24: 25.0000 mg | ORAL_TABLET | Freq: Every day | ORAL | 1 refills | Status: DC

## 2020-03-31 MED ORDER — TRAZODONE HCL 50 MG PO TABS
25.0000 mg | ORAL_TABLET | Freq: Every evening | ORAL | Status: DC | PRN
  Administered 2020-04-03 – 2020-04-05 (×4): 50 mg via ORAL
  Filled 2020-03-31 (×5): qty 1

## 2020-03-31 MED ORDER — MONTELUKAST SODIUM 10 MG PO TABS
10.0000 mg | ORAL_TABLET | Freq: Every day | ORAL | Status: DC
  Administered 2020-03-31 – 2020-04-06 (×7): 10 mg via ORAL
  Filled 2020-03-31 (×7): qty 1

## 2020-03-31 MED ORDER — FE FUMARATE-B12-VIT C-FA-IFC PO CAPS
1.0000 | ORAL_CAPSULE | Freq: Two times a day (BID) | ORAL | Status: DC
  Administered 2020-03-31 – 2020-04-06 (×13): 1 via ORAL
  Filled 2020-03-31 (×14): qty 1

## 2020-03-31 MED ORDER — DIPHENHYDRAMINE HCL 12.5 MG/5ML PO ELIX: 12.5000 mg | ORAL_SOLUTION | Freq: Four times a day (QID) | ORAL | Status: DC | PRN

## 2020-03-31 MED ORDER — TRAMADOL HCL 50 MG PO TABS: 50.0000 mg | ORAL_TABLET | ORAL | Status: DC | PRN

## 2020-03-31 MED ORDER — BISACODYL 10 MG RE SUPP
10.0000 mg | Freq: Every day | RECTAL | Status: DC | PRN
Start: 2020-03-31 — End: 2020-04-07

## 2020-03-31 MED ORDER — ENSURE MAX PROTEIN PO LIQD
11.0000 [oz_av] | Freq: Three times a day (TID) | ORAL | Status: DC
  Administered 2020-03-31 – 2020-04-01 (×2): 11 [oz_av] via ORAL

## 2020-03-31 MED ORDER — POTASSIUM CHLORIDE CRYS ER 20 MEQ PO TBCR
20.0000 meq | EXTENDED_RELEASE_TABLET | Freq: Every day | ORAL | Status: DC
  Administered 2020-04-01 – 2020-04-05 (×5): 20 meq via ORAL
  Filled 2020-03-31 (×7): qty 1

## 2020-03-31 MED ORDER — POLYETHYLENE GLYCOL 3350 17 G PO PACK: 17.0000 g | PACK | Freq: Every day | ORAL | Status: DC | PRN

## 2020-03-31 MED ORDER — BENZONATATE 100 MG PO CAPS: 200.0000 mg | ORAL_CAPSULE | Freq: Three times a day (TID) | ORAL | Status: DC | PRN

## 2020-03-31 MED ORDER — FLEET ENEMA 7-19 GM/118ML RE ENEM: 1.0000 | ENEMA | Freq: Once | RECTAL | Status: DC | PRN

## 2020-03-31 MED ORDER — PROCHLORPERAZINE EDISYLATE 10 MG/2ML IJ SOLN: 5.0000 mg | Freq: Four times a day (QID) | INTRAMUSCULAR | Status: DC | PRN

## 2020-03-31 MED ORDER — AMIODARONE HCL 200 MG PO TABS: 200.0000 mg | ORAL_TABLET | Freq: Every day | ORAL | 1 refills | Status: DC

## 2020-03-31 MED ORDER — PROCHLORPERAZINE MALEATE 5 MG PO TABS
5.0000 mg | ORAL_TABLET | Freq: Four times a day (QID) | ORAL | Status: DC | PRN
  Administered 2020-04-01: 07:00:00 10 mg via ORAL
  Filled 2020-03-31: qty 2

## 2020-03-31 MED ORDER — DOCUSATE SODIUM 100 MG PO CAPS
200.0000 mg | ORAL_CAPSULE | Freq: Every day | ORAL | Status: DC
  Administered 2020-04-01 – 2020-04-07 (×7): 200 mg via ORAL
  Filled 2020-03-31 (×8): qty 2

## 2020-03-31 MED ORDER — ADULT MULTIVITAMIN W/MINERALS CH
1.0000 | ORAL_TABLET | Freq: Every day | ORAL | Status: DC
  Administered 2020-03-31 – 2020-04-06 (×7): 1 via ORAL
  Filled 2020-03-31 (×7): qty 1

## 2020-03-31 MED ORDER — ALBUTEROL SULFATE (2.5 MG/3ML) 0.083% IN NEBU
2.5000 mg | INHALATION_SOLUTION | Freq: Four times a day (QID) | RESPIRATORY_TRACT | Status: DC | PRN
  Administered 2020-04-04: 2.5 mg via RESPIRATORY_TRACT
  Filled 2020-03-31: qty 3

## 2020-03-31 MED ORDER — WARFARIN SODIUM 2 MG PO TABS: 3.0000 mg | ORAL_TABLET | Freq: Every day | ORAL | Status: DC

## 2020-03-31 MED ORDER — SIMVASTATIN 20 MG PO TABS
10.0000 mg | ORAL_TABLET | Freq: Every day | ORAL | Status: DC
  Administered 2020-03-31 – 2020-04-06 (×7): 10 mg via ORAL
  Filled 2020-03-31 (×7): qty 1

## 2020-03-31 MED ORDER — FUROSEMIDE 40 MG PO TABS
40.0000 mg | ORAL_TABLET | Freq: Every day | ORAL | Status: DC
  Administered 2020-04-01 – 2020-04-05 (×5): 40 mg via ORAL
  Filled 2020-03-31 (×5): qty 1

## 2020-03-31 MED ORDER — ACETAMINOPHEN 325 MG PO TABS
325.0000 mg | ORAL_TABLET | ORAL | Status: DC | PRN
  Administered 2020-04-01 (×2): 650 mg via ORAL
  Filled 2020-03-31 (×4): qty 2

## 2020-03-31 MED ORDER — CHLORHEXIDINE GLUCONATE CLOTH 2 % EX PADS: 6.0000 | MEDICATED_PAD | Freq: Every day | CUTANEOUS | Status: DC

## 2020-03-31 MED ORDER — ALUM & MAG HYDROXIDE-SIMETH 200-200-20 MG/5ML PO SUSP: 30.0000 mL | ORAL | Status: DC | PRN

## 2020-03-31 MED ORDER — FE FUMARATE-B12-VIT C-FA-IFC PO CAPS: 1.0000 | ORAL_CAPSULE | Freq: Every day | ORAL | Status: DC

## 2020-03-31 MED ORDER — BOOST / RESOURCE BREEZE PO LIQD CUSTOM: 237.0000 mL | Freq: Three times a day (TID) | ORAL | Status: DC

## 2020-03-31 MED ORDER — WARFARIN SODIUM 3 MG PO TABS: 3.0000 mg | ORAL_TABLET | Freq: Every day | ORAL | Status: DC

## 2020-03-31 MED ORDER — POTASSIUM CHLORIDE CRYS ER 20 MEQ PO TBCR: 20.0000 meq | EXTENDED_RELEASE_TABLET | Freq: Two times a day (BID) | ORAL | 0 refills | Status: DC

## 2020-03-31 MED ORDER — TRAMADOL HCL 50 MG PO TABS
50.0000 mg | ORAL_TABLET | ORAL | Status: DC | PRN
  Administered 2020-03-31: 50 mg via ORAL
  Filled 2020-03-31: qty 1

## 2020-03-31 NOTE — Progress Notes (Signed)
Inpatient Rehabilitation Admissions Coordinator   Cir bed is available to admit patient to today. I met with patient and his wife at bedside and they are in agreement. I have alerted acute team and TOC. I will make the arrangements to admit today.  Danne Baxter, RN, MSN Rehab Admissions Coordinator [phone] 03/31/2020 10:14 AM

## 2020-03-31 NOTE — Progress Notes (Signed)
CARDIAC REHAB PHASE I   Pt helped OOB to recliner for breakfast. Continues to need reinforcement of sternal precautions. Reinforced d/c education. Stressed importance of participating in rehab. Will f/u as time and pts schedule allows to ambulate.  [id number] Reynold Bowen, RN BSN 03/31/2020 8:24 AM

## 2020-03-31 NOTE — Progress Notes (Addendum)
Progress Note  Patient Name: Patrick [name] Date of Encounter: 03/31/2020  [facility] Cardiologist: Nicki Guadalajara, MD   Subjective   Pt feels well, wishes to discharge home.   Inpatient Medications    Scheduled Meds: . amiodarone  200 mg Oral Daily  . Chlorhexidine Gluconate Cloth  6 each Topical Daily  . docusate sodium  200 mg Oral Daily  . feeding supplement  237 mL Oral TID  . ferrous fumarate-b12-vitamic C-folic acid  1 capsule Oral Q breakfast  . furosemide  40 mg Oral Daily  . levothyroxine  25 mcg Oral Q0600  . mouth rinse  15 mL Mouth Rinse BID  . metoprolol succinate  25 mg Oral Daily  . montelukast  10 mg Oral QHS  . pantoprazole  40 mg Oral QHS  . potassium chloride  20 mEq Oral BID  . simvastatin  10 mg Oral QHS  . sodium chloride flush  3 mL Intravenous Q12H  . warfarin  5 mg Oral q1600  . Warfarin - Physician Dosing Inpatient   Does not apply q1600   Continuous Infusions: . sodium chloride     PRN Meds: sodium chloride, albuterol, benzonatate, metoprolol tartrate, ondansetron (ZOFRAN) IV, oxyCODONE, sodium chloride flush, traMADol   Vital Signs    Vitals:   03/30/20 2320 03/31/20 0430 03/31/20 0438 03/31/20 0751  BP: 116/63 131/60    Pulse: 80 73    Resp: 16 16    Temp: 98.2 F (36.8 C) 98.5 F (36.9 C)  98.1 F (36.7 C)  TempSrc: Oral Oral  Oral  SpO2: 95% 96%    Weight:   82.2 kg   Height:        Intake/Output Summary (Last 24 hours) at 03/31/2020 0827 Last data filed at 03/31/2020 0300 Gross per 24 hour  Intake 10 ml  Output 150 ml  Net -140 ml   Last 3 Weights 03/31/2020 03/30/2020 03/29/2020  Weight (lbs) 181 lb 3.2 oz 184 lb 8.4 oz 191 lb 4.8 oz  Weight (kg) 82.192 kg 83.7 kg 86.773 kg      Telemetry    Maintaining NSR - Personally Reviewed  ECG    No new tracings - Personally Reviewed  Physical Exam   GEN: No acute distress.   Neck: No JVD Cardiac: RRR, no murmurs, rubs, or gallops.  Respiratory: diminished  in bases GI: Soft, nontender, non-distended  MS: R > L B LE edema Neuro:  Nonfocal  Psych: Normal affect   Labs    High Sensitivity Troponin:  No results for input(s): TROPONINIHS in the last 720 hours.    Chemistry Recent Labs  Lab 03/29/20 0500 03/30/20 0315 03/31/20 0618  NA 136 136 134*  K 4.2 3.9 3.9  CL 102 101 101  CO2 25 25 25   GLUCOSE 94 99 96  BUN 18 19 18   CREATININE 1.44* 1.43* 1.41*  CALCIUM 8.1* 8.2* 8.1*  GFRNONAA 49* 50* 50*  ANIONGAP 9 10 8      Hematology Recent Labs  Lab 03/29/20 0500 03/30/20 0315 03/31/20 0618  WBC 8.1 7.7 7.2  RBC 3.00* 2.98* 2.75*  HGB 8.6* 8.7* 8.1*  HCT 26.8* 26.1* 24.2*  MCV 89.3 87.6 88.0  MCH 28.7 29.2 29.5  MCHC 32.1 33.3 33.5  RDW 14.8 14.7 14.6  PLT 388 380 360    BNPNo results for input(s): BNP, PROBNP in the last 168 hours.   DDimer No results for input(s): DDIMER in the last 168 hours.   Radiology  No results found.  Cardiac Studies   Limited echo 03/21/20 1. There is a large pericardial effusion present that present posterior  to the LV and surrounding the apex. It measures 2.2-2.6 cm posterior to  the LV and ~1.6 cm in the apical views. There is ~25% inspiratory  variation in the MV inflow. Due to  bandages, the IVC was not visualized. The effusion is new since the last  echocardiogram. Due to hypotension, there are concerns for tamponade.  Clinical correlation recommended. Large pericardial effusion. The  pericardial effusion is posterior to the left  ventricle and surrounding the apex.  2. Left ventricular ejection fraction, by estimation, is 45 to 50%. The  left ventricle has mildly decreased function. The left ventricle  demonstrates global hypokinesis. Left ventricular diastolic function could  not be evaluated.  3. Right ventricular systolic function is normal. The right ventricular  size is normal.  4. MV repair with 32 mm sorin annuloplasty ring. MG 3.1 mmHG @ 98 bpm.  The mitral  valve has been repaired/replaced. No evidence of mitral valve  regurgitation. There is a 32 mm prosthetic annuloplasty ring present in  the mitral position. Procedure Date:  03/11/2020.  5. The aortic valve is tricuspid. Aortic valve regurgitation is mild. No  aortic stenosis is present.  6. Ascending aorta repair. Measures 3.0 cm. Aortic root/ascending aorta  has been repaired/replaced.   Comparison(s): Changes from prior study are noted.   Limited echo 03/28/20 Post window IMPRESSIONS  1. Limited echo for pericardial effusion. A trivial effusion is now  present. NSR is now present.  2. Left ventricular ejection fraction, by estimation, is 60 to 65%. The  left ventricle has normal function.  3. Right ventricular systolic function is normal. The right ventricular  size is normal.  4. The mitral valve has been repaired/replaced. No evidence of mitral  valve regurgitation. The mean mitral valve gradient is 5.0 mmHg with  average heart rate of 76 bpm. There is a 32 mm prosthetic annuloplasty  ring present in the mitral position.  Procedure Date: 03/11/2020.  5. The aortic valve is grossly normal.  6. The inferior vena cava is dilated in size with >50% respiratory  variability, suggesting right atrial pressure of 8 mmHg.    Patient Profile     79 y.o. male with hypertension, hyperlipidemia, asthma, OSA (followed by Dr. Claiborne Billings), normal coronary arteries and severe mitral regurgitation. He was admitted for planned MVR on 03/11/2020 with Complex valvuloplasty including artificial Gore-tex neochord placement x4, suture plication of posterior leaflet and posteromedial commissure, Sorin Memo 4D Ring Annuloplasty (size 70mm, catalog #[id number], serial [id number]), complicated by acute type A aortic dissection upon initiation of cardiopulmonary bypass. He underwent intra-op resuspension of native aortic valve and supracoronary straight graft repair of ascending aorta(Hemashield Platinum size 26  and 28 mm). Cardiology was consulted on 03/21/2020 for atrial fibrillation at which time he was found to have a large pericardial effusion. He underwent pericardial window and evacuation on 03/22/2020. Limited echo 12/25 with trivial effusion.  Assessment & Plan    Severe mitral valve regurgitation s/p MV repair Complicated by acute Type A dissection s/p repair - plans for discharge to CIR - continue coumadin - will need INR during CIR - INR today 1.6 - per pharmacy   Post op Atrial fibrillation  - telemetry with NSR - felt likely due to pericardial effusion - continue amiodarone 200 mg daily, lopressor 25 mg daily - suspect amiodarone can be weaned OP after 3 months -  anticoagulated with coumadin   Post op pericardial effusion (12/18]21) - s/p drainage via subxyphoid approach and windwo + tube placement - limited echo 12/25 with trivial effusion   Hypertension - ARB held for AKI this admission - pressure has been generally controlled - will continue to follow, may need to consider amlodipine if pressure control needed   Hyperlipidemia - continue zocor 10 mg - 11/14/2019: Cholesterol, Total 184; HDL 70; LDL Chol Calc (NIH) 98; Triglycerides 87 - normal coronaries   Bilateral lower extremity edema - diuresing on 40 mg PO lasix - suspect he will still need several days of diuretic after discharge   AKI - sCr 1.41 - baseline prior to surgery was 0.8 - 1.3-1.4 ?new baseline   Hypothyroidism - TSH 6.5, low T3, normal free T4 - started on synthroid prior to surgery - will need repeat labs in 4 weeks   Post op anemia - Hb 8.1 (8.7) - no signs of bleeding - on trinsicon       For questions or updates, please contact [place] Please consult [email or web address] for contact info under        Signed, Ledora Bottcher, PA  03/31/2020, 8:27 AM     Patient seen and examined. Agree with assessment and plan.  Chart reviewed with extensive surgery from initial  planned mitral valve repair as a consequence of acute aortic dissection, need for supra coronary straight graft repair of ascending aorta subsequent pericardial window for large pericardial effusion.  Hemodynamically he is stable.  He is very appreciative of his care.  Breathing well, no pleuritic chest pain.  Maintaining sinus rhythm with rate in the 70s.  No pericardial friction rub.  No JVD or HJR.  No edema.  INR 1.6 today on warfarin per pharmacy.  To transfer to cardiac inpatient rehab today.   Troy Sine, MD, [facility] 03/31/2020 11:59 AM

## 2020-03-31 NOTE — Progress Notes (Signed)
Patient arrived to unit with wife at bedside, no complications noted at this time. Jay Schlichter, LPN

## 2020-03-31 NOTE — Progress Notes (Addendum)
ANTICOAGULATION CONSULT NOTE - Initial Consult  Pharmacy Consult for Warfarin Indication: atrial fibrillation  No Known Allergies  Patient Measurements: Weight: 82.2 kg Height: 70 inches  Vital Signs: Temp: 97.8 F (36.6 C) (12/28 1331) Temp Source: Oral (12/28 1331) BP: 107/59 (12/28 0908) Pulse Rate: 83 (12/28 1331)  Labs: Recent Labs    03/29/20 0500 03/30/20 0315 03/31/20 0618  HGB 8.6* 8.7* 8.1*  HCT 26.8* 26.1* 24.2*  PLT 388 380 360  LABPROT 15.5* 16.1* 18.8*  INR 1.3* 1.4* 1.6*  CREATININE 1.44* 1.43* 1.41*    Estimated Creatinine Clearance: 43.1 mL/min (A) (by C-G formula based on SCr of 1.41 mg/dL (H)).   Medical History: Past Medical History:  Diagnosis Date  . Acute thoracic aortic dissection (HCC) 03/11/2020   Type A  . Allergy   . Asthma   . Cataract   . Cataract   . Diverticulitis   . Dyspnea   . GERD (gastroesophageal reflux disease)   . Heart murmur 12/2019  . Hyperlipidemia   . Hypertension   . Inguinal hernia bilateral, non-recurrent   . S/P aortic dissection repair 03/11/2020   supracoronary straight graft repair with resuspension of native aortic valve and open distal anastomosis for intraoperative acute type A aortic dissection   . S/P mitral valve repair 03/11/2020   Complex valvuloplasty including artificial Gore-tex neochord placement x 4, suture plication of posterior leaflet and posteromedial commissure and 32 mm Sorin Memo 4D ring annuloplasty  . Sleep apnea    Assessment: 79 yr old man transferred from [facility] to [facility] Inpatient [facility] ([facility]) S/P MVR, repair of iatrogenic acute type A aortic dissection, subxyphoid pericardial fluid drainage with chest tube insertion. Pharmacy is consulted to dose warfarin for atrial fibrillation (warfarin was managed by [facility]CTS during inpatient admission).  H/H 8.1/24.2, platelets 360 (CBC relatively stable); INR 1.6 today (slowly trending up); pt was started on amiodarone during acute  admission; also on simvastatin and levothyroxine; albumin 2.0 (12/20), pt on thin liquid diet with supplements, MVI; per RN, no bleeding issues observed  Goal of Therapy:  INR 2.0-3.0 Monitor platelets by anticoagulation protocol: Yes   Plan:  Warfarin 3 mg PO X 1 today Monitor daily INR, CBC Monitor for signs/symptoms of bleeding  Vicki Mallet, PharmD, BCPS, [facility] Clinical Pharmacist 03/31/2020,4:25 PM

## 2020-03-31 NOTE — TOC Transition Note (Addendum)
Transition of Care (TOC) - CM/SW Discharge Note Donn Pierini RN, BSN Transitions of Care Unit 4E- RN Case Manager See Treatment Team for direct phone #    Patient Details  Name: Patrick [name] MRN: [id number] Date of Birth: May 29, 1940  Transition of Care [facility]) CM/SW Contact:  Darrold Span, RN Phone Number: 03/31/2020, 12:17 PM   Clinical Narrative:    Pt stable for transition today to INPT rehab- [facility]. Expedited Appeal completed and insurance has approved INPT rehab stay on 12/27, notified by Britta Mccreedy with INPT rehab that there is a bed available today for pt.  Pt has been cleared by medical team to go to INPT rehab, pt agreeable to accept the bed and will transfer later today to [facility] INPT rehab. Have notified Kensey with [facility] that pt will be going to INPT rehab at this time- she will f/u with them for any [facility] needs post rehab stay.   Final next level of care: IP Rehab Facility Barriers to Discharge: Barriers Resolved   Patient Goals and CMS Choice Patient states their goals for this hospitalization and ongoing recovery are:: rehab then return home CMS Medicare.gov Compare Post Acute Care list provided to:: Patient Choice offered to / list presented to : Patient  Discharge Placement                 [facility] INPT rehab      Discharge Plan and Services In-house Referral: Clinical Social Work Discharge Planning Services: CM Consult Post Acute Care Choice: IP Rehab          DME Arranged: Dan Humphreys rolling DME Agency: [employer]       HH Arranged: PT,OT HH Agency: [facility] Date HH Agency Contacted: 03/17/20 Time HH Agency Contacted: 1500 Representative spoke with at [facility] Agency: Kensey  Social Determinants of Health (SDOH) Interventions     Readmission Risk Interventions Readmission Risk Prevention Plan 03/31/2020  Transportation Screening Complete  PCP or Specialist Appt within 5-7 Days (No Data)  Home Care Screening Complete   Medication Review (RN CM) Complete  Some recent data might be hidden

## 2020-03-31 NOTE — Discharge Instructions (Signed)
Information on my medicine - Coumadin®   (Warfarin) ° °This medication education was reviewed with me or my healthcare representative as part of my discharge preparation.   ° °Why was Coumadin prescribed for you? °Coumadin was prescribed for you because you have a blood clot or a medical condition that can cause an increased risk of forming blood clots. Blood clots can cause serious health problems by blocking the flow of blood to the heart, lung, or brain. Coumadin can prevent harmful blood clots from forming. ° °What test will check on my response to Coumadin? °While on Coumadin (warfarin) you will need to have an INR test regularly to ensure that your dose is keeping you in the desired range. The INR (international normalized ratio) number is calculated from the result of the laboratory test called prothrombin time (PT). ° °If an INR APPOINTMENT HAS NOT ALREADY BEEN MADE FOR YOU please schedule an appointment to have this lab work done by your health care provider within 7 days. °Your INR goal is usually a number between:  2 to 3 or your provider may give you a more narrow range like 2-2.5.  Ask your health care provider during an office visit what your goal INR is. ° °What  do you need to  know  About  COUMADIN? °Take Coumadin (warfarin) exactly as prescribed by your healthcare provider about the same time each day.  DO NOT stop taking without talking to the doctor who prescribed the medication.  Stopping without other blood clot prevention medication to take the place of Coumadin may increase your risk of developing a new clot or stroke.  Get refills before you run out. ° °What do you do if you miss a dose? °If you miss a dose, take it as soon as you remember on the same day then continue your regularly scheduled regimen the next day.  Do not take two doses of Coumadin at the same time. ° °Important Safety Information °A possible side effect of Coumadin (Warfarin) is an increased risk of bleeding. You should  call your healthcare provider right away if you experience any of the following: °? Bleeding from an injury or your nose that does not stop. °? Unusual colored urine (red or dark Esses) or unusual colored stools (red or black). °? Unusual bruising for unknown reasons. °? A serious fall or if you hit your head (even if there is no bleeding). ° °Some foods or medicines interact with Coumadin® (warfarin) and might alter your response to warfarin. To help avoid this: °? Eat a balanced diet, maintaining a consistent amount of Vitamin K. °? Notify your provider about major diet changes you plan to make. °? Avoid alcohol or limit your intake to 1 drink for women and 2 drinks for men per day. °(1 drink is 5 oz. wine, 12 oz. beer, or 1.5 oz. liquor.) ° °Make sure that ANY health care provider who prescribes medication for you knows that you are taking Coumadin (warfarin).  Also make sure the healthcare provider who is monitoring your Coumadin knows when you have started a new medication including herbals and non-prescription products. ° °Coumadin® (Warfarin)  Major Drug Interactions  °Increased Warfarin Effect Decreased Warfarin Effect  °Alcohol (large quantities) °Antibiotics (esp. Septra/Bactrim, Flagyl, Cipro) °Amiodarone (Cordarone) °Aspirin (ASA) °Cimetidine (Tagamet) °Megestrol (Megace) °NSAIDs (ibuprofen, naproxen, etc.) °Piroxicam (Feldene) °Propafenone (Rythmol SR) °Propranolol (Inderal) °Isoniazid (INH) °Posaconazole (Noxafil) Barbiturates (Phenobarbital) °Carbamazepine (Tegretol) °Chlordiazepoxide (Librium) °Cholestyramine (Questran) °Griseofulvin °Oral Contraceptives °Rifampin °Sucralfate (Carafate) °Vitamin K  ° °  Major Herbal Interactions  Increased Warfarin Effect Decreased Warfarin Effect  Garlic Ginseng Ginkgo biloba Coenzyme Q10 Green tea St. Johns wort    Coumadin (Warfarin) FOOD Interactions  Eat a consistent number of servings per week of foods HIGH in Vitamin K (1 serving =   cup)  Collards (cooked, or boiled & drained) Kale (cooked, or boiled & drained) Mustard greens (cooked, or boiled & drained) Parsley *serving size only =  cup Spinach (cooked, or boiled & drained) Swiss chard (cooked, or boiled & drained) Turnip greens (cooked, or boiled & drained)  Eat a consistent number of servings per week of foods MEDIUM-HIGH in Vitamin K (1 serving = 1 cup)  Asparagus (cooked, or boiled & drained) Broccoli (cooked, boiled & drained, or raw & chopped) Brussel sprouts (cooked, or boiled & drained) *serving size only =  cup Lettuce, raw (green leaf, endive, romaine) Spinach, raw Turnip greens, raw & chopped   These websites have more information on Coumadin (warfarin):  http://www.king-russell.com/; https://www.hines.net/;    Discharge Instructions:  1. You may shower, please wash incisions daily with soap and water and keep dry.  If you wish to cover wounds with dressing you may do so but please keep clean and change daily.  No tub baths or swimming until incisions have completely healed.  If your incisions become red or develop any drainage please call our office at [phone]  2. No Driving until cleared by our office and you are no longer using narcotic pain medications  3. Monitor your weight daily.. Please use the same scale and weigh at same time... If you gain 3-5 lbs in 48 hours with associated lower extremity swelling, please contact our office at [phone]  4. Fever of 101.5 for at least 24 hours with no source, please contact our office at [phone]  5. Activity- up as tolerated, please walk at least 3 times per day.  Avoid strenuous activity, no lifting, pushing, or pulling with your arms over 8-10 lbs for a minimum of 6 weeks  6. If any questions or concerns arise, please do not hesitate to contact our office at [phone]

## 2020-03-31 NOTE — Progress Notes (Signed)
Izora Ribas, MD  Physician  Physical Medicine and Rehabilitation  PMR Pre-admission     Signed  Date of Service:  03/30/2020 11:17 AM      Related encounter: Admission (Current) from 03/11/2020 in [facility] 4E CV SURGICAL PROGRESSIVE CARE       Signed          Show:Clear all [x] Manual[x] Template[] Copied  Added by: [x] Cristina Gong, RN[x] Raulkar, Clide Deutscher, MD[x] Michel Santee, PT   [] Hover for details  PMR Admission Coordinator Pre-Admission Assessment   Patient: Patrick [name] is an 79 y.o., male MRN: [id number] DOB: 1940/06/07 Height: 5' 10"  (177.8 cm) Weight: 82.2 kg   Insurance Information HMO: yes    PPO:      PCP:      IPA:      80/20:      OTHER:  PRIMARY: [facility] Medicare      Policy#: [id number]      Subscriber: pt CM Name: Patrick [name] with [employer]      Phone#: [phone]    Fax#: [phone] Pre-Cert#: [id number] [place] for CIR provided by Amy with Patrick [name], updates due to fax listed above on 12/29      Employer: n/a Benefits:  Phone #: [phone]     Name:  Eff. Date: 04/05/19     Deduct: $0      Out of Pocket Max: $4500 [id number])      Life Max: n/a CIR: $325/day for days 1-5      SNF: 20 full days Outpatient:      Co-Pay: $35/visit Home [name]: 100%      Co-Pay:  DME: 80%     Co-Ins: 20% Providers: n/a SECONDARY:       Policy#:      Phone#:    Development worker, community:       Phone#:    The Therapist, art Information Summary" for patients in Inpatient Rehabilitation Facilities with attached "Privacy Act [place] Records" was provided and verbally reviewed with: Patient   Emergency Contact Information         Contact Information     Name Relation Home Work Mobile    Patrick [name] Spouse [phone]   [phone]         Current Medical History  Patient Admitting Diagnosis: debility following MVR with aortic dissection and pericardial effusion   History of Present Illness: Pt is an 79 y/o male with PMH of HTN, OSA, GERD, and remote  history of heavy tobacco use, admitted to [facility] on 03/11/20 for planned repair of mitral valve prolapse with severe mitral regurgitation.  During the surgical procedure they also repaired an acute type A aortic dissection.  Subsequently extubated and weaned from all cardiac drips.  Post op course complicated by afib with RVR requiring amio bolus and infusion, which has been weaned, as well as pericardial effusion which required a return to the OR for drainage with pericardial window.   Post op course complicated by acute blood loss anemia and hypotension.  Therapy evaluations were completed and pt was recommended for CIR to progress to mod I before returning home.    Patient's medical record from [facility] has been reviewed by the rehabilitation admission coordinator and physician.   Past Medical History      Past Medical History:  Diagnosis Date  . Acute thoracic aortic dissection (HCC) 03/11/2020    Type A  . Allergy    . Asthma    . Cataract    . Cataract    .  Diverticulitis    . Dyspnea    . GERD (gastroesophageal reflux disease)    . Heart murmur 12/2019  . Hyperlipidemia    . Hypertension    . Inguinal hernia bilateral, non-recurrent    . S/P aortic dissection repair 03/11/2020    supracoronary straight graft repair with resuspension of native aortic valve and open distal anastomosis for intraoperative acute type A aortic dissection   . S/P mitral valve repair 03/11/2020    Complex valvuloplasty including artificial Gore-tex neochord placement x 4, suture plication of posterior leaflet and posteromedial commissure and 32 mm Sorin Memo 4D ring annuloplasty  . Sleep apnea        Family History   family history includes Arthritis in his brother, father, and sister; Dementia (age of onset: 51) in his mother; Heart disease in his father and mother; Hip fracture (age of onset: 45) in his mother; Osteoporosis in his mother.   Prior Rehab/Hospitalizations Has the patient had  prior rehab or hospitalizations prior to admission? No   Has the patient had major surgery during 100 days prior to admission? Yes              Current Medications   Current Facility-Administered Medications:  .  0.9 %  sodium chloride infusion, 250 mL, Intravenous, PRN, Conte, Tessa N, PA-C .  albuterol (PROVENTIL) (2.5 MG/3ML) 0.083% nebulizer solution 2.5 mg, 2.5 mg, Nebulization, Q6H PRN, Conte, Tessa N, PA-C, 2.5 mg at 03/30/20 2124 .  amiodarone (PACERONE) tablet 200 mg, 200 mg, Oral, Daily, Conte, Tessa N, PA-C, 200 mg at 03/31/20 6629 .  benzonatate (TESSALON) capsule 200 mg, 200 mg, Oral, TID PRN, Harriet Pho, Tessa N, PA-C .  Chlorhexidine Gluconate Cloth 2 % PADS 6 each, 6 each, Topical, Daily, Elgie Collard, [place], 6 each at 03/31/20 4765 .  docusate sodium (COLACE) capsule 200 mg, 200 mg, Oral, Daily, Conte, Tessa N, PA-C, 200 mg at 03/29/20 1021 .  feeding supplement (BOOST / RESOURCE BREEZE) liquid 1 Container, 237 mL, Oral, TID, Elgie Collard, PA-C, 1 Container at 03/25/20 [id number] .  ferrous [id number] C-folic acid (TRINSICON / FOLTRIN) capsule 1 capsule, 1 capsule, Oral, Q breakfast, Conte, Tessa N, [place], 1 capsule at 03/31/20 0909 .  furosemide (LASIX) tablet 40 mg, 40 mg, Oral, Daily, Barrett, Erin R, PA-C, 40 mg at 03/31/20 0908 .  levothyroxine (SYNTHROID) tablet 25 mcg, 25 mcg, Oral, Q0600, Elgie Collard, PA-C, 25 mcg at 03/31/20 0617 .  MEDLINE mouth rinse, 15 mL, Mouth Rinse, BID, Conte, Tessa N, PA-C, 15 mL at 03/29/20 1022 .  metoprolol succinate (TOPROL-XL) 24 hr tablet 25 mg, 25 mg, Oral, Daily, Dorothy Spark, MD, 25 mg at 03/30/20 1034 .  metoprolol tartrate (LOPRESSOR) injection 2.5-5 mg, 2.5-5 mg, Intravenous, Q2H PRN, Conte, Tessa N, PA-C, 5 mg at 03/22/20 1236 .  montelukast (SINGULAIR) tablet 10 mg, 10 mg, Oral, QHS, Conte, Tessa N, PA-C, 10 mg at 03/30/20 2120 .  ondansetron (ZOFRAN) injection 4 mg, 4 mg, Intravenous, Q6H PRN, Elgie Collard, PA-C, 4 mg  at 03/21/20 0951 .  oxyCODONE (Oxy IR/ROXICODONE) immediate release tablet 5-10 mg, 5-10 mg, Oral, Q3H PRN, Elgie Collard, PA-C, 5 mg at 03/24/20 9449 .  pantoprazole (PROTONIX) EC tablet 40 mg, 40 mg, Oral, QHS, Conte, Tessa N, PA-C, 40 mg at 03/30/20 2120 .  potassium chloride SA (KLOR-CON) CR tablet 20 mEq, 20 mEq, Oral, BID, Conte, Tessa N, PA-C, 20 mEq at 03/31/20 0910 .  simvastatin (  ZOCOR) tablet 10 mg, 10 mg, Oral, QHS, Conte, Tessa N, PA-C, 10 mg at 03/30/20 2120 .  sodium chloride flush (NS) 0.9 % injection 3 mL, 3 mL, Intravenous, Q12H, Conte, Tessa N, PA-C, 3 mL at 03/31/20 0910 .  sodium chloride flush (NS) 0.9 % injection 3 mL, 3 mL, Intravenous, PRN, Conte, Tessa N, PA-C .  traMADol Veatrice Bourbon) tablet 50-100 mg, 50-100 mg, Oral, Q4H PRN, Harriet Pho, Tessa N, PA-C, 50 mg at 03/24/20 2004 .  warfarin (COUMADIN) tablet 3 mg, 3 mg, Oral, q1600, Rexene Alberts, MD .  Warfarin - Physician Dosing Inpatient, , Does not apply, q1600, Miguel Aschoff, Given at 03/29/20 1737   Patients Current Diet:     Diet Order                      Diet heart healthy/carb modified Room service appropriate? Yes with Assist; Fluid consistency: Thin  Diet effective now                      Precautions / Restrictions Precautions Precautions: Sternal,Fall Precaution Booklet Issued: Yes (comment) Precaution Comments: reviewed "move in the tube" and no hard pushing/pulling with UEs, Restrictions Weight Bearing Restrictions: Yes (sternal precautions) RUE Weight Bearing: Weight bearing as tolerated LUE Weight Bearing: Weight bearing as tolerated Other Position/Activity Restrictions: Sternal precautions    Has the patient had 2 or more falls or a fall with injury in the past year? No   Prior Activity Level Community (5-7x/wk): retired but very active prior to admit, still driving, no DME used   Prior Functional Level Self Care: Did the patient need help bathing, dressing, using the toilet or  eating? Independent   Indoor Mobility: Did the patient need assistance with walking from room to room (with or without device)? Independent   Stairs: Did the patient need assistance with internal or external stairs (with or without device)? Independent   Functional Cognition: Did the patient need help planning regular tasks such as shopping or remembering to take medications? Independent   Home Assistive Devices / Equipment Home Assistive Devices/Equipment: None Home Equipment: None   Prior Device Use: Indicate devices/aids used by the patient prior to current illness, exacerbation or injury? None of the above   Current Functional Level Cognition   Overall Cognitive Status: Within Functional Limits for tasks assessed Orientation Level: Oriented X4 General Comments: appears Patrick [name] - [place] for basic mobility tasks.    Extremity Assessment (includes Sensation/Coordination)   Upper Extremity Assessment: Generalized weakness  Lower Extremity Assessment: Defer to PT evaluation     ADLs   Overall ADL's : Needs assistance/impaired Eating/Feeding: Independent,Sitting Grooming: Min guard,Standing,Oral care,Wash/dry hands,Wash/dry face Grooming Details (indicate cue type and reason): Pt will need further education on compensatory strategies for grooming tasks and "staying in the tube" Upper Body Bathing: Minimal assistance,Sitting Lower Body Bathing: Minimal assistance,Sit to/from stand Lower Body Bathing Details (indicate cue type and reason): knees down Upper Body Dressing : Minimal assistance,Sitting Lower Body Dressing: Moderate assistance,Sit to/from stand Toilet Transfer: Regular Toilet,Ambulation,RW,Minimal assistance,+2 for safety/equipment Toilet Transfer Details (indicate cue type and reason): rocking motion for momentum Toileting- Clothing Manipulation and Hygiene: Moderate assistance Functional mobility during ADLs: Minimal assistance,Rolling walker General ADL Comments: Pt requiring Min  A +2 for fucntional transfers and then safety during mobility inhallway. Pt with forward lean into eva walker and requiring tactile cues for upright posture. Fatigues quickly. VSS throughout     Mobility   Overal bed  mobility: Needs Assistance Bed Mobility: Rolling,Sidelying to Sit,Sit to Sidelying Rolling: Min guard Sidelying to sit: Min assist,HOB elevated Supine to sit: Mod assist,HOB elevated Sit to sidelying: Min assist General bed mobility comments: Assist with trunk to get to EOB, cues for technique; Cues to adhere to sternal precautions. assist for LES back into bed     Transfers   Overall transfer level: Needs assistance Equipment used: Rolling walker (2 wheeled) Transfers: Sit to/from Stand Sit to Stand: Min assist General transfer comment: Cues for hand placement on B knees to push into standing.  Pt frequently attempting to reach outside "the tube"     Ambulation / Gait / Stairs / Wheelchair Mobility   Ambulation/Gait Ambulation/Gait assistance: Counsellor (Feet): 400 Feet Assistive device: Rolling walker (2 wheeled) Gait Pattern/deviations: Decreased stride length,Trunk flexed,Step-through pattern General Gait Details: Slow, mostly steady gait with cues for RW proximity, upright posture and to relax traps; Multiple standing rest breaks; Sp02 93% on RA, cues for pursed lip breathing. Pt reports fatigue. Constant cues needed to stand up tall and extend shoulders Gait velocity: decreased Gait velocity interpretation: <1.31 ft/sec, indicative of household ambulator     Posture / Balance Dynamic Sitting Balance Sitting balance - Comments: Supervision for safety, prefers to lean on RW or legs for support. Balance Overall balance assessment: Needs assistance Sitting-balance support: Feet supported,No upper extremity supported Sitting balance-Leahy Scale: Fair Sitting balance - Comments: Supervision for safety, prefers to lean on RW or legs for support. Standing  balance support: During functional activity Standing balance-Leahy Scale: Poor Standing balance comment: Requires UE support in standing.     Special needs/care consideration Skin sternal incision    Previous Home Environment (from acute therapy documentation) Living Arrangements: Spouse/significant other Available Help at Discharge: Family,Available 24 hours/day Type of Home: House Home Layout: One level Home Access: Stairs to enter Entrance Stairs-Rails: Left Entrance Stairs-Number of Steps: 3 Home Care Services: No   Discharge Living Setting Plans for Discharge Living Setting: Patient's home Type of Home at Discharge: House Discharge Home Layout: One level Discharge Home Access: Stairs to enter Entrance Stairs-Rails: Left Entrance Stairs-Number of Steps: 3 Discharge Bathroom Shower/Tub: Tub/shower unit Discharge Bathroom Toilet: Standard Discharge Bathroom Accessibility: Yes How Accessible: Accessible via walker Does the patient have any problems obtaining your medications?: No   Social/Family/Support Systems Patient Roles: Spouse Anticipated Caregiver: spouse, Gearl Baratta Anticipated Caregiver's Contact Information: [phone] (cell) Ability/Limitations of Caregiver: supervision only Caregiver Availability: 24/7 Discharge Plan Discussed with Primary Caregiver: Yes Is Caregiver In Agreement with Plan?: Yes Does Caregiver/Family have Issues with Lodging/Transportation while Pt is in Rehab?: No   Goals Patient/Family Goal for Rehab: PT/OT mod I, SLP n/a Expected length of stay: 6-9 days Pt/Family Agrees to Admission and willing to participate: Yes Program Orientation Provided & Reviewed with Pt/Caregiver Including Roles  & Responsibilities: Yes   Decrease burden of Care through IP rehab admission: n/a   Possible need for SNF placement upon discharge: No.    Patient Condition: I have reviewed medical records from [facility], spoken with CM, and patient and  spouse. I met with patient at the bedside for inpatient rehabilitation assessment.  Patient will benefit from ongoing PT and OT, can actively participate in 3 hours of therapy a day 5 days of the week, and can make measurable gains during the admission.  Patient will also benefit from the coordinated team approach during an Inpatient Acute Rehabilitation admission.  The patient will receive intensive therapy as  well as Rehabilitation physician, nursing, social worker, and care management interventions.  Due to safety, skin/wound care, medication administration, pain management and patient education the patient requires 24 hour a day rehabilitation nursing.  The patient is currently min assist overall with mobility and basic ADLs.  Discharge setting and therapy post discharge at home is anticipated.  Patient has agreed to participate in the Acute Inpatient Rehabilitation Program and will admit today.   Preadmission Screen Completed By: Shann Medal, PT, DPT, with day of admission updates by Cleatrice Burke, 03/31/2020 10:15 AM ______________________________________________________________________   Discussed status with Dr. Ranell Patrick on 03/31/20  at 10:15 AM  and received approval for admission today.    Admission Coordinator: Shann Medal, PT, DPT, and  Cleatrice Burke, RN, time 10:15 AM Sudie Grumbling 03/31/20     Assessment/Plan: Diagnosis: Debility s/p mitral valve repair 1. Does the need for close, 24 hr/day Medical supervision in concert with the patient's rehab needs make it unreasonable for this patient to be served in a less intensive setting? Yes 2. Co-Morbidities requiring supervision/potential complications: essential HTN, overweight (BMI 26), OSA, GERD, HLD, s/p aortic dissection repair 3. Due to bladder management, bowel management, safety, skin/wound care, disease management, medication administration, pain management and patient education, does the patient require 24 hr/day rehab  nursing? Yes 4. Does the patient require coordinated care of a physician, rehab nurse, PT, OT to address physical and functional deficits in the context of the above medical diagnosis(es)? Yes Addressing deficits in the following areas: balance, endurance, locomotion, strength, transferring, bowel/bladder control, bathing, dressing, feeding, grooming, toileting and psychosocial support 5. Can the patient actively participate in an intensive therapy program of at least 3 hrs of therapy 5 days a week? Yes 6. The potential for patient to make measurable gains while on inpatient rehab is excellent 7. Anticipated functional outcomes upon discharge from inpatient rehab: modified independent PT, modified independent OT, independent SLP 8. Estimated rehab length of stay to reach the above functional goals is: 5-7 days 9. Anticipated discharge destination: Home 10. Overall Rehab/Functional Prognosis: excellent     MD Signature: Leeroy Cha, MD          Revision History                                  Note Details  Author Izora Ribas, MD File Time 03/31/2020 10:28 AM  Author Type Physician Status Signed  Last Editor Izora Ribas, MD Service Physical Medicine and Rehabilitation

## 2020-03-31 NOTE — H&P (Signed)
Physical Medicine and Rehabilitation Admission H&P    CC: Functional deficits due to debility   HPI:  Patrick [name] is an 79 year old male with history of HTN, severe OSA--not using CPAP, MVP with severe regurgitation and mild SOB with non-exertional CP for the past few months. He was admitted on 03/11/20 for MVR and repair of iatrogenic acute type A aortic dissection by Dr. Roxy Manns. Peri- operative course significant for severe coagulopathy treated with platelets, cryoprecipitate, FFP and recombinant factor VII.  He tolerated extubation without difficulty on 12/09 but hospital course significant for fluid overload requiring intermittent diuresis, acute renal failure, orthostatic hypotension with dizziness, anorexia, ABLA as well as onset of A fib with RVR.   Dr. Jenetta Downer' Nori Riis consulted f12/18 for management of A fib due to soft BP and patient treated with IV albumin in addition to midodrine for BP support. Follow up 2D echo showed large pericardial effusion and he underwent subxiphoid window with finding of 250-300 cc dark blood and placement of blake drain on 12/19 by Dr. Servando Snare.  He has converted to NSR with amiodarone load and rate controlled with addition of Toprol XL. Follow up 2 D echo 12/25 showed trivial effusion with EF 60-65%.  He has been transitioned to po lasix with recommendations tom monitor renal status and small bilateral pleural effusions being managed with pulmonary hygiene. Po intake improving with intermittent nausea due to meds. Medically optimized but noted to have weakness due to debility. Admitted to CIR on 03/31/20 with impaired mobility and ADLs. Pain is currently well controlled.    Review of Systems  Constitutional: Negative for chills and fever.  HENT: Negative for hearing loss and tinnitus.   Eyes: Negative for double vision.  Cardiovascular: Positive for leg swelling. Negative for chest pain and palpitations.  Gastrointestinal: Positive for heartburn. Negative for  abdominal pain, constipation and nausea.  Genitourinary: Positive for frequency and urgency (due to diuretics--no problems PTA). Negative for dysuria.  Musculoskeletal: Negative for joint pain and myalgias.  Skin: Negative for rash.  Neurological: Positive for dizziness (only with actiivty) and weakness. Negative for headaches.  Psychiatric/Behavioral: Negative for hallucinations. The patient is not nervous/anxious.      Past Medical History:  Diagnosis Date  . Acute thoracic aortic dissection (HCC) 03/11/2020   Type A  . Allergy   . Asthma   . Cataract   . Cataract   . Diverticulitis   . Dyspnea   . GERD (gastroesophageal reflux disease)   . Heart murmur 12/2019  . Hyperlipidemia   . Hypertension   . Inguinal hernia bilateral, non-recurrent   . S/P aortic dissection repair 03/11/2020   supracoronary straight graft repair with resuspension of native aortic valve and open distal anastomosis for intraoperative acute type A aortic dissection   . S/P mitral valve repair 03/11/2020   Complex valvuloplasty including artificial Gore-tex neochord placement x 4, suture plication of posterior leaflet and posteromedial commissure and 32 mm Sorin Memo 4D ring annuloplasty  . Sleep apnea     Past Surgical History:  Procedure Laterality Date  . bilateral inguinal hernia    . CARDIAC CATHETERIZATION Bilateral 02/21/2020  . CHEST TUBE INSERTION N/A 03/22/2020   Procedure: CHEST TUBE INSERTION OF 28 BLAKE DRAIN.;  Surgeon: Grace Isaac, MD;  Location: [facility] OR;  Service: Thoracic;  Laterality: N/A;  . COLONOSCOPY  06/09/2004   [id number] colon diverticulosis, otherwise normal colonoscopy  . EYE SURGERY  08/2014  . HERNIA REPAIR    .  MITRAL VALVE REPAIR N/A 03/11/2020   Procedure: MITRAL VALVE REPAIR (MVR) USING MEMO 4D SIZE 32 RING;  Surgeon: Rexene Alberts, MD;  Location: [place];  Service: Open Heart Surgery;  Laterality: N/A;  . PALATE / UVULA BIOPSY / EXCISION    . PERICARDIAL WINDOW  N/A 03/22/2020   Procedure: SUBXYPHOID POST-OP PERICARDIAL FLUID DRAINAGE WITH CHEST TUBE INSERTION.;  Surgeon: Grace Isaac, MD;  Location: [place];  Service: Thoracic;  Laterality: N/A;  . REPAIR OF ACUTE ASCENDING THORACIC AORTIC DISSECTION N/A 03/11/2020   Procedure: REPAIR OF ACUTE ASCENDING THORACIC AORTIC DISSECTION USING A HEMASHIELD PLATINUM 26MM STRAIGHT GRAFT AND A HEASHIELD PLATINUM 28X10MM SINGLE ARM GRAFT;  Surgeon: Rexene Alberts, MD;  Location: [place];  Service: Open Heart Surgery;  Laterality: N/A;  . RIGHT/LEFT HEART CATH AND CORONARY ANGIOGRAPHY N/A 02/21/2020   Procedure: RIGHT/LEFT HEART CATH AND CORONARY ANGIOGRAPHY;  Surgeon: Troy Sine, MD;  Location: [place] [facility];  Service: Cardiovascular;  Laterality: N/A;  . TEE WITHOUT CARDIOVERSION N/A 12/19/2019   Procedure: TRANSESOPHAGEAL ECHOCARDIOGRAM (TEE);  Surgeon: Buford Dresser, MD;  Location: [facility] ENDOSCOPY;  Service: Cardiovascular;  Laterality: N/A;  . TEE WITHOUT CARDIOVERSION N/A 03/11/2020   Procedure: TRANSESOPHAGEAL ECHOCARDIOGRAM (TEE);  Surgeon: Rexene Alberts, MD;  Location: [place];  Service: Open Heart Surgery;  Laterality: N/A;  . TEE WITHOUT CARDIOVERSION N/A 03/22/2020   Procedure: TRANSESOPHAGEAL ECHOCARDIOGRAM (TEE);  Surgeon: Grace Isaac, MD;  Location: [facility] OR;  Service: Thoracic;  Laterality: N/A;  . TONSILLECTOMY    . VASECTOMY      Family History  Problem Relation Age of Onset  . Heart disease Mother   . Hip fracture Mother 26  . Osteoporosis Mother   . Dementia Mother 62  . Arthritis Father   . Heart disease Father   . Arthritis Sister   . Arthritis Brother   . Colon cancer Neg Hx     Social History:  Married. Retired Administrator. He eports that he quit smoking about 37 years ago. His smoking use included cigarettes. He has a 100.00 pack-year smoking history. He has never used smokeless tobacco. He reports current alcohol use-- 5 shots of brandy per week and  occasional beer.  He reports that he does not use drugs.    Allergies: No Known Allergies    Medications Prior to Admission  Medication Sig Dispense Refill  . acetaminophen (TYLENOL) 500 MG tablet Take 500 mg by mouth daily as needed (pain.).    Derrill Memo ON 04/01/2020] amiodarone (PACERONE) 200 MG tablet Take 1 tablet (200 mg total) by mouth daily. 30 tablet 1  . [START ON 04/01/2020] ferrous Q000111Q C-folic acid (TRINSICON / FOLTRIN) capsule Take 1 capsule by mouth daily with breakfast.    . [START ON 04/01/2020] furosemide (LASIX) 40 MG tablet Take 1 tablet (40 mg total) by mouth daily for 3 days. 3 tablet   . hydroxypropyl methylcellulose / hypromellose (ISOPTO TEARS / GONIOVISC) 2.5 % ophthalmic solution Place 1-2 drops into both eyes 3 (three) times daily as needed (dry/irritated eyes.).    Derrill Memo ON 04/01/2020] levothyroxine (SYNTHROID) 25 MCG tablet Take 1 tablet (25 mcg total) by mouth daily at 6 (six) AM. 30 tablet 1  . [START ON 04/01/2020] metoprolol succinate (TOPROL-XL) 25 MG 24 hr tablet Take 1 tablet (25 mg total) by mouth daily. 30 tablet 1  . montelukast (SINGULAIR) 10 MG tablet Take 1 tablet (10 mg total) daily by mouth. (Patient taking differently:  Take 10 mg by mouth at bedtime. ) 90 tablet 3  . Olopatadine HCl (PATADAY) 0.2 % SOLN Place 1 drop into both eyes daily as needed (allergies).     . pantoprazole (PROTONIX) 40 MG tablet Take 1 tablet (40 mg total) daily by mouth. (Patient taking differently: Take 40 mg by mouth at bedtime. ) 90 tablet 3  . potassium chloride SA (KLOR-CON) 20 MEQ tablet Take 1 tablet (20 mEq total) by mouth 2 (two) times daily for 3 days. 6 tablet 0  . PROAIR HFA 108 (90 Base) MCG/ACT inhaler Inhale 2 puffs into the lungs every 6 (six) hours as needed for wheezing. (Patient taking differently: Inhale 2 puffs into the lungs every 6 (six) hours as needed for wheezing. ) 8.5 g 2  . simvastatin (ZOCOR) 10 MG tablet Take 1 tablet (10 mg  total) by mouth at bedtime. 90 tablet 3  . traMADol (ULTRAM) 50 MG tablet Take 1-2 tablets (50-100 mg total) by mouth every 4 (four) hours as needed for moderate pain. 30 tablet   . warfarin (COUMADIN) 3 MG tablet Take 1 tablet (3 mg total) by mouth daily at 4 PM. 30 tablet 1    Drug Regimen Review  Drug regimen was reviewed and remains appropriate with no significant issues identified  Home: Home Living Family/patient expects to be discharged to:: Private residence Living Arrangements: Spouse/significant other Available Help at Discharge: Family,Available 24 hours/day Type of Home: House Home Access: Stairs to enter [employer] of Steps: 3 Entrance Stairs-Rails: Left Home Layout: One level Home Equipment: None   Functional History: Prior Function Level of Independence: Independent Comments: Does own ADLs/IADLs.  Functional Status:  Mobility: Bed Mobility Overal bed mobility: Needs Assistance Bed Mobility: Rolling,Sidelying to Sit,Sit to Sidelying Rolling: Min guard Sidelying to sit: Min assist,HOB elevated Supine to sit: Mod assist,HOB elevated Sit to sidelying: Min assist General bed mobility comments: Assist with trunk to get to EOB, cues for technique; Cues to adhere to sternal precautions. assist for LES back into bed Transfers Overall transfer level: Needs assistance Equipment used: Rolling walker (2 wheeled) Transfers: Sit to/from Stand Sit to Stand: Min assist General transfer comment: Cues for hand placement on B knees to push into standing.  Pt frequently attempting to reach outside "the tube" Ambulation/Gait Ambulation/Gait assistance: Min guard Gait Distance (Feet): 400 Feet Assistive device: Rolling walker (2 wheeled) Gait Pattern/deviations: Decreased stride length,Trunk flexed,Step-through pattern General Gait Details: Slow, mostly steady gait with cues for RW proximity, upright posture and to relax traps; Multiple standing rest breaks; Sp02  93% on RA, cues for pursed lip breathing. Pt reports fatigue. Constant cues needed to stand up tall and extend shoulders Gait velocity: decreased Gait velocity interpretation: <1.31 ft/sec, indicative of household ambulator  ADL: ADL Overall ADL's : Needs assistance/impaired Eating/Feeding: Independent,Sitting Grooming: Min guard,Standing,Oral care,Wash/dry hands,Wash/dry face Grooming Details (indicate cue type and reason): Pt will need further education on compensatory strategies for grooming tasks and "staying in the tube" Upper Body Bathing: Minimal assistance,Sitting Lower Body Bathing: Minimal assistance,Sit to/from stand Lower Body Bathing Details (indicate cue type and reason): knees down Upper Body Dressing : Minimal assistance,Sitting Lower Body Dressing: Moderate assistance,Sit to/from stand Toilet Transfer: Regular Toilet,Ambulation,RW,Minimal assistance,+2 for safety/equipment Toilet Transfer Details (indicate cue type and reason): rocking motion for momentum Toileting- Clothing Manipulation and Hygiene: Moderate assistance Functional mobility during ADLs: Minimal assistance,Rolling walker General ADL Comments: Pt requiring Min A +2 for fucntional transfers and then safety during mobility inhallway. Pt with forward  lean into eva walker and requiring tactile cues for upright posture. Fatigues quickly. VSS throughout  Cognition: Cognition Overall Cognitive Status: Within Functional Limits for tasks assessed Orientation Level: Oriented X4 Cognition Arousal/Alertness: Awake/alert Behavior During Therapy: WFL for tasks assessed/performed Overall Cognitive Status: Within Functional Limits for tasks assessed General Comments: appears [facility] for basic mobility tasks.   Blood pressure (!) 141/65, pulse 73, temperature 98.9 F (37.2 C), temperature source Oral, resp. rate 16, height 5\' 10"  (1.778 m), weight 82.8 kg, SpO2 95 %. Physical Exam General: Alert and oriented x 3, No  apparent distress HEENT: Head is normocephalic, atraumatic, PERRLA, EOMI, sclera anicteric, oral mucosa pink and moist, dentition intact, ext ear canals clear,  Neck: Supple without JVD or lymphadenopathy Heart: Reg rate and rhythm. No murmurs rubs or gallops Chest: Right pectoral incision tender to touch--C/D/I. Right chest tube and lower thoracic sutures in place. Sternal incision C/D/I.  Abdomen: Soft, non-tender, non-distended, bowel sounds positive. Extremities: No clubbing, cyanosis, or edema. Pulses are 2+ Skin: Clean and intact without signs of breakdown Neuro: Pt is cognitively appropriate with normal insight, memory, and awareness. Cranial nerves 2-12 are intact. Sensory exam is normal. Reflexes are 2+ in all 4's. Fine motor coordination is intact. No tremors. Motor function is grossly 5/5.  Musculoskeletal: 1+ pedal edema on right and min tibial edema LLE.   Psych: Pt's affect is appropriate. Pt is cooperative   Results for orders placed or performed during the hospital encounter of 03/11/20 (from the past 48 hour(s))  Glucose, capillary     Status: Abnormal   Collection Time: 03/29/20  9:48 PM  Result Value Ref Range   Glucose-Capillary 142 (H) 70 - 99 mg/dL    Comment: Glucose reference range applies only to samples taken after fasting for at least 8 hours.  Protime-INR     Status: Abnormal   Collection Time: 03/30/20  3:15 AM  Result Value Ref Range   Prothrombin Time 16.1 (H) 11.4 - 15.2 seconds   INR 1.4 (H) 0.8 - 1.2    Comment: (NOTE) INR goal varies based on device and disease states. Performed at [facility], [place] [address] [place], [place] [address]   CBC     Status: Abnormal   Collection Time: 03/30/20  3:15 AM  Result Value Ref Range   WBC 7.7 4.0 - 10.5 K/uL   RBC 2.98 (L) 4.22 - 5.81 MIL/uL   Hemoglobin 8.7 (L) 13.0 - 17.0 g/dL   HCT 26.1 (L) 39.0 - 52.0 %   MCV 87.6 80.0 - 100.0 fL   MCH 29.2 26.0 - 34.0 pg   MCHC 33.3 30.0 - 36.0 g/dL   RDW  14.7 11.5 - 15.5 %   Platelets 380 150 - 400 K/uL   nRBC 0.0 0.0 - 0.2 %    Comment: Performed at [facility], [place] [address], [place], [place] [id number]  Basic metabolic panel     Status: Abnormal   Collection Time: 03/30/20  3:15 AM  Result Value Ref Range   Sodium 136 135 - 145 mmol/L   Potassium 3.9 3.5 - 5.1 mmol/L   Chloride 101 98 - 111 mmol/L   CO2 25 22 - 32 mmol/L   Glucose, Bld 99 70 - 99 mg/dL    Comment: Glucose reference range applies only to samples taken after fasting for at least 8 hours.   BUN 19 8 - 23 mg/dL   Creatinine, Ser 1.43 (H) 0.61 - 1.24 mg/dL  Calcium 8.2 (L) 8.9 - 10.3 mg/dL   GFR, Estimated 50 (L) >60 mL/min    Comment: (NOTE) Calculated using the CKD-EPI Creatinine Equation (2021)    Anion gap 10 5 - 15    Comment: Performed at [facility], [place] [address], [place], [place] [address]  Glucose, capillary     Status: None   Collection Time: 03/30/20  5:47 AM  Result Value Ref Range   Glucose-Capillary 91 70 - 99 mg/dL    Comment: Glucose reference range applies only to samples taken after fasting for at least 8 hours.  Glucose, capillary     Status: None   Collection Time: 03/30/20 11:40 AM  Result Value Ref Range   Glucose-Capillary 83 70 - 99 mg/dL    Comment: Glucose reference range applies only to samples taken after fasting for at least 8 hours.   Comment 1 Notify RN    Comment 2 Document in Chart   Glucose, capillary     Status: None   Collection Time: 03/30/20  5:00 PM  Result Value Ref Range   Glucose-Capillary 93 70 - 99 mg/dL    Comment: Glucose reference range applies only to samples taken after fasting for at least 8 hours.   Comment 1 Notify RN    Comment 2 Document in Chart   Protime-INR     Status: Abnormal   Collection Time: 03/31/20  6:18 AM  Result Value Ref Range   Prothrombin Time 18.8 (H) 11.4 - 15.2 seconds   INR 1.6 (H) 0.8 - 1.2    Comment: (NOTE) INR goal varies based on device and disease  states. Performed at [facility], [place] [address], [place], [place] [id number]   Basic metabolic panel     Status: Abnormal   Collection Time: 03/31/20  6:18 AM  Result Value Ref Range   Sodium 134 (L) 135 - 145 mmol/L   Potassium 3.9 3.5 - 5.1 mmol/L   Chloride 101 98 - 111 mmol/L   CO2 25 22 - 32 mmol/L   Glucose, Bld 96 70 - 99 mg/dL    Comment: Glucose reference range applies only to samples taken after fasting for at least 8 hours.   BUN 18 8 - 23 mg/dL   Creatinine, Ser 1.41 (H) 0.61 - 1.24 mg/dL   Calcium 8.1 (L) 8.9 - 10.3 mg/dL   GFR, Estimated 50 (L) >60 mL/min    Comment: (NOTE) Calculated using the CKD-EPI Creatinine Equation (2021)    Anion gap 8 5 - 15    Comment: Performed at [facility] [address] [place], [place] [address]  CBC     Status: Abnormal   Collection Time: 03/31/20  6:18 AM  Result Value Ref Range   WBC 7.2 4.0 - 10.5 K/uL   RBC 2.75 (L) 4.22 - 5.81 MIL/uL   Hemoglobin 8.1 (L) 13.0 - 17.0 g/dL   HCT 24.2 (L) 39.0 - 52.0 %   MCV 88.0 80.0 - 100.0 fL   MCH 29.5 26.0 - 34.0 pg   MCHC 33.5 30.0 - 36.0 g/dL   RDW 14.6 11.5 - 15.5 %   Platelets 360 150 - 400 K/uL   nRBC 0.0 0.0 - 0.2 %    Comment: Performed at [facility], [place] [address] [place], [place] [address]  Glucose, capillary     Status: None   Collection Time: 03/31/20 11:37 AM  Result Value Ref Range   Glucose-Capillary 79  70 - 99 mg/dL    Comment: Glucose reference range applies only to samples taken after fasting for at least 8 hours.   No results found.     Medical Problem List and Plan: 1.  Impaired mobility secondary to mitral valve repair.   -patient may shower but incision must covered.   -ELOS/Goals:  2.  Antithrombotics:5-7 days modI -DVT/anticoagulation:  Pharmaceutical: Coumadin--INR 1.6 and slowly trending up. Will consult pharmacy to dose/manage coumadin.    -antiplatelet therapy: N/a 3. Pain Management: D/c oxycodone since last used 12/21.  Has not used Tramadol either but would like to keep as a PRN.  4. Mood: LCSW to follow for evaluation and support.   -antipsychotic agents: N/a 5. Neuropsych: This patient is capable of making decisions on his own behalf. 6. Skin/Wound Care: Monitor wound for healing. Routine pressure relief measures.  7. Fluids/Electrolytes/Nutrition: Monitor I/O. Check lytes in am. Continue to encourage supplements to promote healing--check albumin level in am. Will add multivitamin also.  8.  Fluid overload: Monitor weights daily and for other signs of overload-->lasix decreased to once/day on 12/27. Low salt diet, add TEDs and elevation to help manage BLE edema. On fluid restriciton 9.  New onset A fib/PAF: Has been in NSR for 24 hours. HR well controlled, continue amiodarone, toprol XL. 10. Acute blood loss anemia: thrombocytopenia has resolved and H/HImproving post transfusions --->will increase iron to bid but later in the day 11. Hypothyroid: TSH- 6.5--new diagnosis --started on supplement 12/21--will need TSH rechecked in 4 weeks.   12. Acute renal failure:  Scr improved from peak of 2.02-->stabilizing to 1.4 (question new baseline)  13.  Anorexia: He has been refusing schedule supplements but intake improving. 14. OSA: Has not been using machine due to intolerance but plans on trialing it again after discharge. May be willing to try while in hospital.  15. Overweight: BMI 26- provide education.   I have personally performed a face to face diagnostic evaluation, including, but not limited to relevant history and physical exam findings, of this patient and developed relevant assessment and plan.  Additionally, I have reviewed and concur with the physician assistant's documentation above.  Leeroy Cha, MD  Bary Leriche, PA-C 03/31/2020

## 2020-03-31 NOTE — H&P (Signed)
Physical Medicine and Rehabilitation Admission H&P    CC: Functional deficits due to debility   HPI:  Patrick [name] is an 79 year old male with history of HTN, severe OSA--not using CPAP, MVP with severe regurgitation and mild SOB with non-exertional CP for the past few months. He was admitted on 03/11/20 for MVR and repair of iatrogenic acute type A aortic dissection by Dr. Cornelius Moras. Peri- operative course significant for severe coagulopathy treated with platelets, cryoprecipitate, FFP and recombinant factor VII.  He tolerated extubation without difficulty on 12/09 but hospital course significant for fluid overload requiring intermittent diuresis, acute renal failure, orthostatic hypotension with dizziness, anorexia, ABLA as well as onset of A fib with RVR.   Dr. Val Eagle' Jennette Kettle consulted f12/18 for management of A fib due to soft BP and patient treated with IV albumin in addition to midodrine for BP support. Follow up 2D echo showed large pericardial effusion and he underwent subxiphoid window with finding of 250-300 cc dark blood and placement of blake drain on 12/19 by Dr. Tyrone Sage.  He has converted to NSR with amiodarone load and rate controlled with addition of Toprol XL. Follow up 2 D echo 12/25 showed trivial effusion with EF 60-65%.  He has been transitioned to po lasix with recommendations tom monitor renal status and small bilateral pleural effusions being managed with pulmonary hygiene. Po intake improving with intermittent nausea due to meds. Medically optimized but noted to have weakness due to debility. Admitted to CIR on 03/31/20 with impaired mobility and ADLs.    Review of Systems  Constitutional: Negative for chills and fever.  HENT: Negative for hearing loss and tinnitus.   Eyes: Negative for double vision.  Cardiovascular: Positive for leg swelling. Negative for chest pain and palpitations.  Gastrointestinal: Positive for heartburn. Negative for abdominal pain, constipation and  nausea.  Genitourinary: Positive for frequency and urgency (due to diuretics--no problems PTA). Negative for dysuria.  Musculoskeletal: Negative for joint pain and myalgias.  Skin: Negative for rash.  Neurological: Positive for dizziness (only with actiivty) and weakness. Negative for headaches.  Psychiatric/Behavioral: Negative for hallucinations. The patient is not nervous/anxious.      Past Medical History:  Diagnosis Date  . Acute thoracic aortic dissection (HCC) 03/11/2020   Type A  . Allergy   . Asthma   . Cataract   . Cataract   . Diverticulitis   . Dyspnea   . GERD (gastroesophageal reflux disease)   . Heart murmur 12/2019  . Hyperlipidemia   . Hypertension   . Inguinal hernia bilateral, non-recurrent   . S/P aortic dissection repair 03/11/2020   supracoronary straight graft repair with resuspension of native aortic valve and open distal anastomosis for intraoperative acute type A aortic dissection   . S/P mitral valve repair 03/11/2020   Complex valvuloplasty including artificial Gore-tex neochord placement x 4, suture plication of posterior leaflet and posteromedial commissure and 32 mm Sorin Memo 4D ring annuloplasty  . Sleep apnea     Past Surgical History:  Procedure Laterality Date  . bilateral inguinal hernia    . CARDIAC CATHETERIZATION Bilateral 02/21/2020  . CHEST TUBE INSERTION N/A 03/22/2020   Procedure: CHEST TUBE INSERTION OF 28 BLAKE DRAIN.;  Surgeon: Delight Ovens, MD;  Location[facility] OR;  Service: Thoracic;  Laterality: N/A;  . COLONOSCOPY  06/09/2004   IRW:ERXVQMG colon diverticulosis, otherwise normal colonoscopy  . EYE SURGERY  08/2014  . HERNIA REPAIR    . MITRAL VALVE REPAIR N/A  03/11/2020   Procedure: MITRAL VALVE REPAIR (MVR) USING MEMO 4D SIZE 32 RING;  Surgeon: Purcell Nails, MD;  Location: [facility] OR;  Service: Open Heart Surgery;  Laterality: N/A;  . PALATE / UVULA BIOPSY / EXCISION    . PERICARDIAL WINDOW N/A 03/22/2020   Procedure:  SUBXYPHOID POST-OP PERICARDIAL FLUID DRAINAGE WITH CHEST TUBE INSERTION.;  Surgeon: Delight Ovens, MD;  Location: [facility] OR;  Service: Thoracic;  Laterality: N/A;  . REPAIR OF ACUTE ASCENDING THORACIC AORTIC DISSECTION N/A 03/11/2020   Procedure: REPAIR OF ACUTE ASCENDING THORACIC AORTIC DISSECTION USING A HEMASHIELD PLATINUM STRAIGHT GRAFT AND A HEASHIELD PLATINUM 28X10MM SINGLE ARM GRAFT;  Surgeon: Purcell Nails, MD;  Location: [facility] OR;  Service: Open Heart Surgery;  Laterality: N/A;  . RIGHT/LEFT HEART CATH AND CORONARY ANGIOGRAPHY N/A 02/21/2020   Procedure: RIGHT/LEFT HEART CATH AND CORONARY ANGIOGRAPHY;  Surgeon: Lennette Bihari, MD;  Location: [facility] INVASIVE CV LAB;  Service: Cardiovascular;  Laterality: N/A;  . TEE WITHOUT CARDIOVERSION N/A 12/19/2019   Procedure: TRANSESOPHAGEAL ECHOCARDIOGRAM (TEE);  Surgeon: Jodelle Red, MD;  Location: [facility] ENDOSCOPY;  Service: Cardiovascular;  Laterality: N/A;  . TEE WITHOUT CARDIOVERSION N/A 03/11/2020   Procedure: TRANSESOPHAGEAL ECHOCARDIOGRAM (TEE);  Surgeon: Purcell Nails, MD;  Location: [facility] OR;  Service: Open Heart Surgery;  Laterality: N/A;  . TEE WITHOUT CARDIOVERSION N/A 03/22/2020   Procedure: TRANSESOPHAGEAL ECHOCARDIOGRAM (TEE);  Surgeon: Delight Ovens, MD;  Location: [facility] OR;  Service: Thoracic;  Laterality: N/A;  . TONSILLECTOMY    . VASECTOMY      Family History  Problem Relation Age of Onset  . Heart disease Mother   . Hip fracture Mother 75  . Osteoporosis Mother   . Dementia Mother 24  . Arthritis Father   . Heart disease Father   . Arthritis Sister   . Arthritis Brother   . Colon cancer Neg Hx     Social History:  Married. Retired Naval architect. He eports that he quit smoking about 37 years ago. His smoking use included cigarettes. He has a 100.00 pack-year smoking history. He has never used smokeless tobacco. He reports current alcohol use-- 5 shots of brandy per week and occasional beer.  He reports that he  does not use drugs.    Allergies: No Known Allergies    Medications Prior to Admission  Medication Sig Dispense Refill  . acetaminophen (TYLENOL) 500 MG tablet Take 500 mg by mouth daily as needed (pain.).    Marland Kitchen fexofenadine-pseudoephedrine (ALLEGRA-D) 60-120 MG 12 hr tablet Take 1 tablet by mouth daily as needed (allergies).     . hydroxypropyl methylcellulose / hypromellose (ISOPTO TEARS / GONIOVISC) 2.5 % ophthalmic solution Place 1-2 drops into both eyes 3 (three) times daily as needed (dry/irritated eyes.).    Marland Kitchen losartan (COZAAR) 50 MG tablet Take 1 tablet (50 mg total) by mouth daily. (Patient taking differently: Take 50 mg by mouth every evening. ) 90 tablet 1  . montelukast (SINGULAIR) 10 MG tablet Take 1 tablet (10 mg total) daily by mouth. (Patient taking differently: Take 10 mg by mouth at bedtime. ) 90 tablet 3  . Olopatadine HCl (PATADAY) 0.2 % SOLN Place 1 drop into both eyes daily as needed (allergies).     . pantoprazole (PROTONIX) 40 MG tablet Take 1 tablet (40 mg total) daily by mouth. (Patient taking differently: Take 40 mg by mouth at bedtime. ) 90 tablet 3  . PROAIR HFA 108 (90 Base) MCG/ACT inhaler Inhale 2 puffs  into the lungs every 6 (six) hours as needed for wheezing. (Patient taking differently: Inhale 2 puffs into the lungs every 6 (six) hours as needed for wheezing. ) 8.5 g 2  . simvastatin (ZOCOR) 10 MG tablet Take 1 tablet (10 mg total) by mouth at bedtime. 90 tablet 3  . benzonatate (TESSALON) 200 MG capsule Take 1 capsule (200 mg total) by mouth 3 (three) times daily as needed for cough. (Patient not taking: Reported on 03/09/2020) 20 capsule 0    Drug Regimen Review  Drug regimen was reviewed and remains appropriate with no significant issues identified  Home: Home Living Family/patient expects to be discharged to:: Private residence Living Arrangements: Spouse/significant other Available Help at Discharge: Family,Available 24 hours/day Type of Home:  House Home Access: Stairs to enter [employer] of Steps: 3 Entrance Stairs-Rails: Left Home Layout: One level Home Equipment: None   Functional History: Prior Function Level of Independence: Independent Comments: Does own ADLs/IADLs.  Functional Status:  Mobility: Bed Mobility Overal bed mobility: Needs Assistance Bed Mobility: Rolling,Sidelying to Sit,Sit to Sidelying Rolling: Min guard Sidelying to sit: Min assist,HOB elevated Supine to sit: Mod assist,HOB elevated Sit to sidelying: Min assist General bed mobility comments: Assist with trunk to get to EOB, cues for technique; Cues to adhere to sternal precautions. assist for LES back into bed Transfers Overall transfer level: Needs assistance Equipment used: Rolling walker (2 wheeled) Transfers: Sit to/from Stand Sit to Stand: Min assist General transfer comment: Cues for hand placement on B knees to push into standing.  Pt frequently attempting to reach outside "the tube" Ambulation/Gait Ambulation/Gait assistance: Min guard Gait Distance (Feet): 400 Feet Assistive device: Rolling walker (2 wheeled) Gait Pattern/deviations: Decreased stride length,Trunk flexed,Step-through pattern General Gait Details: Slow, mostly steady gait with cues for RW proximity, upright posture and to relax traps; Multiple standing rest breaks; Sp02 93% on RA, cues for pursed lip breathing. Pt reports fatigue. Constant cues needed to stand up tall and extend shoulders Gait velocity: decreased Gait velocity interpretation: <1.31 ft/sec, indicative of household ambulator    ADL: ADL Overall ADL's : Needs assistance/impaired Eating/Feeding: Independent,Sitting Grooming: Min guard,Standing,Oral care,Wash/dry hands,Wash/dry face Grooming Details (indicate cue type and reason): Pt will need further education on compensatory strategies for grooming tasks and "staying in the tube" Upper Body Bathing: Minimal assistance,Sitting Lower Body  Bathing: Minimal assistance,Sit to/from stand Lower Body Bathing Details (indicate cue type and reason): knees down Upper Body Dressing : Minimal assistance,Sitting Lower Body Dressing: Moderate assistance,Sit to/from stand Toilet Transfer: Regular Toilet,Ambulation,RW,Minimal assistance,+2 for safety/equipment Toilet Transfer Details (indicate cue type and reason): rocking motion for momentum Toileting- Clothing Manipulation and Hygiene: Moderate assistance Functional mobility during ADLs: Minimal assistance,Rolling walker General ADL Comments: Pt requiring Min A +2 for fucntional transfers and then safety during mobility inhallway. Pt with forward lean into eva walker and requiring tactile cues for upright posture. Fatigues quickly. VSS throughout  Cognition: Cognition Overall Cognitive Status: Within Functional Limits for tasks assessed Orientation Level: Oriented X4 Cognition Arousal/Alertness: Awake/alert Behavior During Therapy: WFL for tasks assessed/performed Overall Cognitive Status: Within Functional Limits for tasks assessed General Comments: appears [facility] for basic mobility tasks.   Blood pressure (!) 107/59, pulse 84, temperature 98.1 F (36.7 C), temperature source Oral, resp. rate 16, height 5\' 10"  (1.778 m), weight 82.2 kg, SpO2 96 %. Physical Exam General: Alert and oriented x 3, No apparent distress HEENT: Head is normocephalic, atraumatic, PERRLA, EOMI, sclera anicteric, oral mucosa pink and moist, dentition intact, ext  ear canals clear,  Neck: Supple without JVD or lymphadenopathy Heart: Reg rate and rhythm. No murmurs rubs or gallops Chest: Right pectoral incision tender to touch--C/D/I. Right chest tube and lower thoracic sutures in place. Sternal incision C/D/I.  Abdomen: Soft, non-tender, non-distended, bowel sounds positive. Extremities: No clubbing, cyanosis, or edema. Pulses are 2+ Skin: Clean and intact without signs of breakdown Neuro: Pt is cognitively  appropriate with normal insight, memory, and awareness. Cranial nerves 2-12 are intact. Sensory exam is normal. Reflexes are 2+ in all 4's. Fine motor coordination is intact. No tremors. Motor function is grossly 5/5.  Musculoskeletal: 1+ pedal edema on right and min tibial edema LLE.   Psych: Pt's affect is appropriate. Pt is cooperative   Results for orders placed or performed during the hospital encounter of 03/11/20 (from the past 48 hour(s))  Glucose, capillary     Status: Abnormal   Collection Time: 03/29/20 11:14 AM  Result Value Ref Range   Glucose-Capillary 109 (H) 70 - 99 mg/dL    Comment: Glucose reference range applies only to samples taken after fasting for at least 8 hours.  Glucose, capillary     Status: Abnormal   Collection Time: 03/29/20  4:30 PM  Result Value Ref Range   Glucose-Capillary 100 (H) 70 - 99 mg/dL    Comment: Glucose reference range applies only to samples taken after fasting for at least 8 hours.  Glucose, capillary     Status: Abnormal   Collection Time: 03/29/20  9:48 PM  Result Value Ref Range   Glucose-Capillary 142 (H) 70 - 99 mg/dL    Comment: Glucose reference range applies only to samples taken after fasting for at least 8 hours.  Protime-INR     Status: Abnormal   Collection Time: 03/30/20  3:15 AM  Result Value Ref Range   Prothrombin Time 16.1 (H) 11.4 - 15.2 seconds   INR 1.4 (H) 0.8 - 1.2    Comment: (NOTE) INR goal varies based on device and disease states. Performed at [facility], [place] [address], [place], [place] [address]   CBC     Status: Abnormal   Collection Time: 03/30/20  3:15 AM  Result Value Ref Range   WBC 7.7 4.0 - 10.5 K/uL   RBC 2.98 (L) 4.22 - 5.81 MIL/uL   Hemoglobin 8.7 (L) 13.0 - 17.0 g/dL   HCT 26.1 (L) 39.0 - 52.0 %   MCV 87.6 80.0 - 100.0 fL   MCH 29.2 26.0 - 34.0 pg   MCHC 33.3 30.0 - 36.0 g/dL   RDW 14.7 11.5 - 15.5 %   Platelets 380 150 - 400 K/uL   nRBC 0.0 0.0 - 0.2 %    Comment: Performed at  [facility], [place] [address], [place], [place] [id number]  Basic metabolic panel     Status: Abnormal   Collection Time: 03/30/20  3:15 AM  Result Value Ref Range   Sodium 136 135 - 145 mmol/L   Potassium 3.9 3.5 - 5.1 mmol/L   Chloride 101 98 - 111 mmol/L   CO2 25 22 - 32 mmol/L   Glucose, Bld 99 70 - 99 mg/dL    Comment: Glucose reference range applies only to samples taken after fasting for at least 8 hours.   BUN 19 8 - 23 mg/dL   Creatinine, Ser 1.43 (H) 0.61 - 1.24 mg/dL   Calcium 8.2 (L) 8.9 - 10.3 mg/dL   GFR, Estimated 50 (L) >60 mL/min  Comment: (NOTE) Calculated using the CKD-EPI Creatinine Equation (2021)    Anion gap 10 5 - 15    Comment: Performed at [facility], [place] [address] [place], [place] [address]  Glucose, capillary     Status: None   Collection Time: 03/30/20  5:47 AM  Result Value Ref Range   Glucose-Capillary 91 70 - 99 mg/dL    Comment: Glucose reference range applies only to samples taken after fasting for at least 8 hours.  Glucose, capillary     Status: None   Collection Time: 03/30/20 11:40 AM  Result Value Ref Range   Glucose-Capillary 83 70 - 99 mg/dL    Comment: Glucose reference range applies only to samples taken after fasting for at least 8 hours.   Comment 1 Notify RN    Comment 2 Document in Chart   Glucose, capillary     Status: None   Collection Time: 03/30/20  5:00 PM  Result Value Ref Range   Glucose-Capillary 93 70 - 99 mg/dL    Comment: Glucose reference range applies only to samples taken after fasting for at least 8 hours.   Comment 1 Notify RN    Comment 2 Document in Chart   Protime-INR     Status: Abnormal   Collection Time: 03/31/20  6:18 AM  Result Value Ref Range   Prothrombin Time 18.8 (H) 11.4 - 15.2 seconds   INR 1.6 (H) 0.8 - 1.2    Comment: (NOTE) INR goal varies based on device and disease states. Performed at [facility], [place] [address] [place], [place] [id number]   Basic metabolic panel      Status: Abnormal   Collection Time: 03/31/20  6:18 AM  Result Value Ref Range   Sodium 134 (L) 135 - 145 mmol/L   Potassium 3.9 3.5 - 5.1 mmol/L   Chloride 101 98 - 111 mmol/L   CO2 25 22 - 32 mmol/L   Glucose, Bld 96 70 - 99 mg/dL    Comment: Glucose reference range applies only to samples taken after fasting for at least 8 hours.   BUN 18 8 - 23 mg/dL   Creatinine, Ser 1.41 (H) 0.61 - 1.24 mg/dL   Calcium 8.1 (L) 8.9 - 10.3 mg/dL   GFR, Estimated 50 (L) >60 mL/min    Comment: (NOTE) Calculated using the CKD-EPI Creatinine Equation (2021)    Anion gap 8 5 - 15    Comment: Performed at [place] [address] [place], [place] [address]  CBC     Status: Abnormal   Collection Time: 03/31/20  6:18 AM  Result Value Ref Range   WBC 7.2 4.0 - 10.5 K/uL   RBC 2.75 (L) 4.22 - 5.81 MIL/uL   Hemoglobin 8.1 (L) 13.0 - 17.0 g/dL   HCT 24.2 (L) 39.0 - 52.0 %   MCV 88.0 80.0 - 100.0 fL   MCH 29.5 26.0 - 34.0 pg   MCHC 33.5 30.0 - 36.0 g/dL   RDW 14.6 11.5 - 15.5 %   Platelets 360 150 - 400 K/uL   nRBC 0.0 0.0 - 0.2 %    Comment: Performed at [facility], [place] [address], [place], [place] [address]   No results found.     Medical Problem List and Plan: 1.  Impaired mobility secondary to mitral valve repair.   -patient may shower but incision must covered.   -ELOS/Goals:  2.  Antithrombotics:5-7 days modI -DVT/anticoagulation:  Pharmaceutical: Coumadin--INR 1.6 and slowly trending up. Will consult pharmacy to dose/manage coumadin.    -antiplatelet therapy: N/a 3. Pain Management: D/c oxycodone since last used 12/21.   4. Mood: LCSW to follow for evaluation and support.   -antipsychotic agents: N/a 5. Neuropsych: This patient is capable of making decisions on his own behalf. 6. Skin/Wound Care: Monitor wound for healing. Routine pressure relief measures.  7. Fluids/Electrolytes/Nutrition: Monitor I/O. Check lytes in am. Continue to encourage supplements to promote  healing--check albumin level in am. Will add multivitamin also.  8.  Fluid overload: Monitor weights daily and for other signs of overload-->lasix decreased to once/day on 12/27. Low salt diet, add TEDs and elevation to help manage BLE edema. On fluid restriciton 9.  New onset A fib/PAF: Has been in NSR for 24 hours. HR well controlled, continue amiodarone, toprol XL. 10. Acute blood loss anemia: thrombocytopenia has resolved and H/HImproving post transfusions --->will increase iron to bid but later in the day 11. Hypothyroid: TSH- 6.5--new diagnosis --started on supplement 12/21--will need TSH rechecked in 4 weeks.   12. Acute renal failure:  Scr improved from peak of 2.02-->stabilizing to 1.4 (question new baseline)  13.  Anorexia: He has been refusing schedule supplements but intake improving. 14. OSA: Has not been using machine due to intolerance but plans on trialing it again after discharge. May be willing to try while in hospital.  15. Overweight: BMI 26- provide education.   I have personally performed a face to face diagnostic evaluation, including, but not limited to relevant history and physical exam findings, of this patient and developed relevant assessment and plan.  Additionally, I have reviewed and concur with the physician assistant's documentation above.  Leeroy Cha, MD  Bary Leriche, PA-C 03/31/2020

## 2020-03-31 NOTE — Progress Notes (Signed)
Inpatient Rehabilitation Medication Review by a Pharmacist   A complete drug regimen review was completed for this patient to identify any potential clinically significant medication issues.   Clinically significant medication issues were identified:  yes     Type of Medication Issue Identified Description of Issue Urgent (address now) Non-Urgent (address on AM team rounds) Plan    Drug Interaction(s) (clinically significant)            Duplicate Therapy   Duplicate tramadol orders  Urgent Kept order with broader range per protocol    Allergy            No Medication Administration End Date            Incorrect Dose            Additional Drug Therapy Needed            Other   -Potassium was decreased from BID to QD  -Held home fexofenadine, isopt tears and losartan 50mg  Non-urgent -Monitor K level and adjust as needed    -Monitor and restart as needed  -Monitor BP and restart as able          Time spent performing this drug regimen review (minutes):  15 min    , PharmD, BCPS, [facility] Clinical Pharmacist   Please check AMION for all [facility] phone numbers After 10:00 PM, call Main Pharmacy [phone]

## 2020-03-31 NOTE — Progress Notes (Signed)
Pt vomited after receiving PO potassium. Pt did not want PRN zofran. Will continue to monitor.

## 2020-03-31 NOTE — Progress Notes (Addendum)
      [address]       [place] [address]             [phone]      9 Days Post-Op Procedure(s) (LRB): SUBXYPHOID POST-OP PERICARDIAL FLUID DRAINAGE WITH CHEST TUBE INSERTION. (N/A) CHEST TUBE INSERTION OF 28 BLAKE DRAIN. (N/A) TRANSESOPHAGEAL ECHOCARDIOGRAM (TEE) (N/A) Subjective: Feels much better this morning.   Objective: Vital signs in last 24 hours: Temp:  [97.7 F (36.5 C)-98.8 F (37.1 C)] 98.5 F (36.9 C) (12/28 0430) Pulse Rate:  [71-80] 73 (12/28 0430) Cardiac Rhythm: Normal sinus rhythm;Heart block (12/27 1900) Resp:  [16-18] 16 (12/28 0430) BP: (116-162)/(60-90) 131/60 (12/28 0430) SpO2:  [93 %-98 %] 96 % (12/28 0430) Weight:  [82.2 kg] 82.2 kg (12/28 0438)     Intake/Output from previous day: 12/27 0701 - 12/28 0700 In: 10 [I.V.:10] Out: 850 [Urine:850] Intake/Output this shift: No intake/output data recorded.  General appearance: alert, cooperative and no distress Heart: regular rate and rhythm, S1, S2 normal, no murmur, click, rub or gallop Lungs: clear to auscultation bilaterally Abdomen: soft, non-tender; bowel sounds normal; no masses,  no organomegaly Extremities: extremities normal, atraumatic, no cyanosis or edema Wound: clean and dry  Lab Results: Recent Labs    03/30/20 0315 03/31/20 0618  WBC 7.7 7.2  HGB 8.7* 8.1*  HCT 26.1* 24.2*  PLT 380 360   BMET:  Recent Labs    03/29/20 0500 03/30/20 0315  NA 136 136  K 4.2 3.9  CL 102 101  CO2 25 25  GLUCOSE 94 99  BUN 18 19  CREATININE 1.44* 1.43*  CALCIUM 8.1* 8.2*    PT/INR:  Recent Labs    03/30/20 0315  LABPROT 16.1*  INR 1.4*   ABG    Component Value Date/Time   PHART 7.373 03/12/2020 1034   HCO3 26.1 03/12/2020 1034   TCO2 23 03/12/2020 1658   ACIDBASEDEF 2.0 03/12/2020 0909   O2SAT 68.9 03/15/2020 0455   CBG (last 3)  Recent Labs    03/30/20 0547 03/30/20 1140 03/30/20 1700  GLUCAP 91 83 93    Assessment/Plan: S/P Procedure(s)  (LRB): SUBXYPHOID POST-OP PERICARDIAL FLUID DRAINAGE WITH CHEST TUBE INSERTION. (N/A) CHEST TUBE INSERTION OF 28 BLAKE DRAIN. (N/A) TRANSESOPHAGEAL ECHOCARDIOGRAM (TEE) (N/A)  1. CV- PAF, no afib in over 24 hours. BP well controlled.  2. Pulm-tolerating room air with good oxygen saturation. Last CXR with small bilateral pleural effusion. 3. Renal-creatinine has been stable.  4. H and H stable 8.1/24.2 5. Endo-blood glucose well controlled 6. Coumadin 3mg  ordered for today. INR 1.6, increasing slowly.   Plan: hopeful for a bed at [facility] today. Insurance has approved. Doing well overall and feels much better. H and H stable.    LOS: 20 days    03/31/2020   I have seen and examined the patient and agree with the assessment and plan as outlined.  04/02/2020, MD 03/31/2020 11:20 AM

## 2020-04-01 ENCOUNTER — Inpatient Hospital Stay (HOSPITAL_COMMUNITY): Payer: Medicare Other | Admitting: Occupational Therapy

## 2020-04-01 ENCOUNTER — Inpatient Hospital Stay (HOSPITAL_COMMUNITY): Payer: Medicare Other

## 2020-04-01 DIAGNOSIS — R791 Abnormal coagulation profile: Secondary | ICD-10-CM | POA: Diagnosis not present

## 2020-04-01 DIAGNOSIS — M791 Myalgia, unspecified site: Secondary | ICD-10-CM | POA: Diagnosis not present

## 2020-04-01 DIAGNOSIS — R5381 Other malaise: Secondary | ICD-10-CM | POA: Diagnosis not present

## 2020-04-01 DIAGNOSIS — N179 Acute kidney failure, unspecified: Secondary | ICD-10-CM | POA: Diagnosis not present

## 2020-04-01 DIAGNOSIS — E039 Hypothyroidism, unspecified: Secondary | ICD-10-CM

## 2020-04-01 DIAGNOSIS — E46 Unspecified protein-calorie malnutrition: Secondary | ICD-10-CM

## 2020-04-01 DIAGNOSIS — E8809 Other disorders of plasma-protein metabolism, not elsewhere classified: Secondary | ICD-10-CM

## 2020-04-01 DIAGNOSIS — D62 Acute posthemorrhagic anemia: Secondary | ICD-10-CM

## 2020-04-01 LAB — MAGNESIUM: Magnesium: 2 mg/dL (ref 1.7–2.4)

## 2020-04-01 LAB — CBC WITH DIFFERENTIAL/PLATELET
Abs Immature Granulocytes: 0.11 10*3/uL — ABNORMAL HIGH (ref 0.00–0.07)
Basophils Absolute: 0 10*3/uL (ref 0.0–0.1)
Basophils Relative: 0 %
Eosinophils Absolute: 0.1 10*3/uL (ref 0.0–0.5)
Eosinophils Relative: 2 %
HCT: 26.8 % — ABNORMAL LOW (ref 39.0–52.0)
Hemoglobin: 8.3 g/dL — ABNORMAL LOW (ref 13.0–17.0)
Immature Granulocytes: 2 %
Lymphocytes Relative: 9 %
Lymphs Abs: 0.6 10*3/uL — ABNORMAL LOW (ref 0.7–4.0)
MCH: 27.6 pg (ref 26.0–34.0)
MCHC: 31 g/dL (ref 30.0–36.0)
MCV: 89 fL (ref 80.0–100.0)
Monocytes Absolute: 0.7 10*3/uL (ref 0.1–1.0)
Monocytes Relative: 12 %
Neutro Abs: 4.6 10*3/uL (ref 1.7–7.7)
Neutrophils Relative %: 75 %
Platelets: 376 10*3/uL (ref 150–400)
RBC: 3.01 MIL/uL — ABNORMAL LOW (ref 4.22–5.81)
RDW: 14.6 % (ref 11.5–15.5)
WBC: 6.1 10*3/uL (ref 4.0–10.5)
nRBC: 0 % (ref 0.0–0.2)

## 2020-04-01 LAB — COMPREHENSIVE METABOLIC PANEL
ALT: 21 U/L (ref 0–44)
AST: 18 U/L (ref 15–41)
Albumin: 2 g/dL — ABNORMAL LOW (ref 3.5–5.0)
Alkaline Phosphatase: 63 U/L (ref 38–126)
Anion gap: 8 (ref 5–15)
BUN: 20 mg/dL (ref 8–23)
CO2: 25 mmol/L (ref 22–32)
Calcium: 8.4 mg/dL — ABNORMAL LOW (ref 8.9–10.3)
Chloride: 104 mmol/L (ref 98–111)
Creatinine, Ser: 1.47 mg/dL — ABNORMAL HIGH (ref 0.61–1.24)
GFR, Estimated: 48 mL/min — ABNORMAL LOW (ref >60)
Glucose, Bld: 97 mg/dL (ref 70–99)
Potassium: 4.1 mmol/L (ref 3.5–5.1)
Sodium: 137 mmol/L (ref 135–145)
Total Bilirubin: 0.5 mg/dL (ref 0.3–1.2)
Total Protein: 5.3 g/dL — ABNORMAL LOW (ref 6.5–8.1)

## 2020-04-01 LAB — PROTIME-INR
INR: 1.6 — ABNORMAL HIGH (ref 0.8–1.2)
Prothrombin Time: 18.8 seconds — ABNORMAL HIGH (ref 11.4–15.2)

## 2020-04-01 LAB — PREALBUMIN: Prealbumin: 12 mg/dL — ABNORMAL LOW (ref 18–38)

## 2020-04-01 MED ORDER — METHOCARBAMOL 500 MG PO TABS
500.0000 mg | ORAL_TABLET | Freq: Three times a day (TID) | ORAL | Status: DC | PRN
  Administered 2020-04-01: 11:00:00 500 mg via ORAL
  Filled 2020-04-01: qty 1

## 2020-04-01 MED ORDER — PROSOURCE PLUS PO LIQD
30.0000 mL | Freq: Two times a day (BID) | ORAL | Status: DC
  Administered 2020-04-01 – 2020-04-07 (×11): 30 mL via ORAL
  Filled 2020-04-01 (×9): qty 30

## 2020-04-01 MED ORDER — WARFARIN SODIUM 2 MG PO TABS
4.0000 mg | ORAL_TABLET | Freq: Once | ORAL | Status: AC
  Administered 2020-04-01: 18:00:00 4 mg via ORAL
  Filled 2020-04-01: qty 2

## 2020-04-01 MED ORDER — ENSURE MAX PROTEIN PO LIQD
11.0000 [oz_av] | Freq: Two times a day (BID) | ORAL | Status: DC
  Administered 2020-04-01 – 2020-04-02 (×2): 11 [oz_av] via ORAL
  Filled 2020-04-01: qty 330

## 2020-04-01 NOTE — Progress Notes (Signed)
Inpatient Rehabilitation Care Coordinator Assessment and Plan Patient Details  Name: Patrick Bell MRN: [id number] Date of Birth: 01-16-1941  Today's Date: 04/01/2020  Hospital Problems: Principal Problem:   Debility Active Problems:   Hypoalbuminemia due to protein-calorie malnutrition (HCC)   AKI (acute kidney injury) (HCC)   Hypothyroidism   Acute blood loss anemia   Muscle pain   Subtherapeutic international normalized ratio (INR)  Past Medical History:  Past Medical History:  Diagnosis Date  . Acute thoracic aortic dissection (HCC) 03/11/2020   Type A  . Allergy   . Asthma   . Cataract   . Cataract   . Diverticulitis   . Dyspnea   . GERD (gastroesophageal reflux disease)   . Heart murmur 12/2019  . Hyperlipidemia   . Hypertension   . Inguinal hernia bilateral, non-recurrent   . S/P aortic dissection repair 03/11/2020   supracoronary straight graft repair with resuspension of native aortic valve and open distal anastomosis for intraoperative acute type A aortic dissection   . S/P mitral valve repair 03/11/2020   Complex valvuloplasty including artificial Gore-tex neochord placement x 4, suture plication of posterior leaflet and posteromedial commissure and 32 mm Sorin Memo 4D ring annuloplasty  . Sleep apnea    Past Surgical History:  Past Surgical History:  Procedure Laterality Date  . bilateral inguinal hernia    . CARDIAC CATHETERIZATION Bilateral 02/21/2020  . CHEST TUBE INSERTION N/A 03/22/2020   Procedure: CHEST TUBE INSERTION OF 28 BLAKE DRAIN.;  Surgeon: Delight Ovens, MD;  Location: [facility] OR;  Service: Thoracic;  Laterality: N/A;  . COLONOSCOPY  06/09/2004   [id number] colon diverticulosis, otherwise normal colonoscopy  . EYE SURGERY  08/2014  . HERNIA REPAIR    . MITRAL VALVE REPAIR N/A 03/11/2020   Procedure: MITRAL VALVE REPAIR (MVR) USING MEMO 4D SIZE 32 RING;  Surgeon: Purcell Nails, MD;  Location: [facility] OR;  Service: Open Heart Surgery;   Laterality: N/A;  . PALATE / UVULA BIOPSY / EXCISION    . PERICARDIAL WINDOW N/A 03/22/2020   Procedure: SUBXYPHOID POST-OP PERICARDIAL FLUID DRAINAGE WITH CHEST TUBE INSERTION.;  Surgeon: Delight Ovens, MD;  Location: [facility] OR;  Service: Thoracic;  Laterality: N/A;  . REPAIR OF ACUTE ASCENDING THORACIC AORTIC DISSECTION N/A 03/11/2020   Procedure: REPAIR OF ACUTE ASCENDING THORACIC AORTIC DISSECTION USING A HEMASHIELD PLATINUM STRAIGHT GRAFT AND A HEASHIELD PLATINUM 28X10MM SINGLE ARM GRAFT;  Surgeon: Purcell Nails, MD;  Location: [facility] OR;  Service: Open Heart Surgery;  Laterality: N/A;  . RIGHT/LEFT HEART CATH AND CORONARY ANGIOGRAPHY N/A 02/21/2020   Procedure: RIGHT/LEFT HEART CATH AND CORONARY ANGIOGRAPHY;  Surgeon: Lennette Bihari, MD;  Location: [facility] INVASIVE CV LAB;  Service: Cardiovascular;  Laterality: N/A;  . TEE WITHOUT CARDIOVERSION N/A 12/19/2019   Procedure: TRANSESOPHAGEAL ECHOCARDIOGRAM (TEE);  Surgeon: Jodelle Red, MD;  Location: [name] ENDOSCOPY;  Service: Cardiovascular;  Laterality: N/A;  . TEE WITHOUT CARDIOVERSION N/A 03/11/2020   Procedure: TRANSESOPHAGEAL ECHOCARDIOGRAM (TEE);  Surgeon: Purcell Nails, MD;  Location: [facility] OR;  Service: Open Heart Surgery;  Laterality: N/A;  . TEE WITHOUT CARDIOVERSION N/A 03/22/2020   Procedure: TRANSESOPHAGEAL ECHOCARDIOGRAM (TEE);  Surgeon: Delight Ovens, MD;  Location: [facility] OR;  Service: Thoracic;  Laterality: N/A;  . TONSILLECTOMY    . VASECTOMY     Social History:  reports that he quit smoking about 37 years ago. His smoking use included cigarettes. He has a 100.00 pack-year smoking history. He has never used smokeless  tobacco. He reports current alcohol use of about 5.0 standard drinks of alcohol per week. He reports that he does not use drugs.  Family / Support Systems Marital Status: Married Patient Roles: Spouse Spouse/Significant Other: Gayle-[phone]-cell Other Supports: Friends and church members Anticipated  Caregiver: wife Ability/Limitations of Caregiver: supervision level Caregiver Availability: 24/7 Family Dynamics: Close knit with family and friends, pt has always been independent and been active all of his life. He wants to get back to this and feels he will.  Social History Preferred language: English Religion: [employer] Cultural Background: No issues Education: HS Read: Yes Write: Yes Employment Status: Retired Public relations account executive Issues: No issues Guardian/Conservator: None-according to MD pt is capable of making his own decisions while here.   Abuse/Neglect Abuse/Neglect Assessment Can Be Completed: Yes Physical Abuse: Denies Verbal Abuse: Denies Sexual Abuse: Denies Exploitation of patient/patient's resources: Denies Self-Neglect: Denies  Emotional Status Pt's affect, behavior and adjustment status: Pt is motivated and plans on making progress and getting back to his independent level. He was more active prior to [place] than men half his age. He plans to get back to this Recent Psychosocial Issues: other health issues managed by MD Psychiatric History: No issues-deferred depression screen due to coping appropriately and verbalizing his concerns Substance Abuse History: No issues  Patient / Family Perceptions, Expectations & Goals Pt/Family understanding of illness & functional limitations: Pt and wife can explain his surgery and getng back his strength. He tries to do without pain meds and tough it out. 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."  [employer]: None Premorbid Home Care/DME Agencies: None Transportation  available at discharge: Wife  Discharge Planning Living Arrangements: Spouse/significant other Support Systems: Spouse/significant other,Church/faith community,Friends/neighbors Type of Residence: Private residence Insurance underwriter Resources: Multimedia programmer (specify) Primary school teacher) Financial Resources: Radio broadcast assistant Screen Referred: No Living Expenses: Own Money Management: Patient,Spouse Does the patient have any problems obtaining your medications?: No Home Management: Wife does inside and pt does outside work Magazine features editor Plans: Return home with wife who is able to provide supervision level. Pt has no plans to require care from wife and will be independent when he is discharged from rehab. Care Coordinator Anticipated Follow Up Needs: HH/OP  Clinical Impression Pleasant motivated yet stubborn gentleman who is ready for therapies and will tough it out without many pain meds. His wife is involved and supportive. Will await therapy evaluations and work on discharge needs.  Elease Hashimoto 04/01/2020, 9:29 AM

## 2020-04-01 NOTE — Progress Notes (Addendum)
[place] PHYSICAL MEDICINE & REHABILITATION PROGRESS NOTE  Subjective/Complaints: Patient seen ambulating and sitting up edge of his bed, working with therapy this morning.  He states he did not sleep well overnight, but no particular reason.  Wife at bedside.  Patient notes right back soreness as well as "soreness, but is trying to avoid taking medications.  ROS: + Nausea, shortness of breath (baseline), noncardiac chest pain, right leg soreness, left foot soreness.  Denies diarrhea.    Objective: Vital Signs: Blood pressure 133/63, pulse 73, temperature 98.1 F (36.7 C), temperature source Oral, resp. rate 16, height 5\' 10"  (1.778 m), weight 81.7 kg, SpO2 95 %. No results found. Recent Labs    03/31/20 0618 04/01/20 0502  WBC 7.2 6.1  HGB 8.1* 8.3*  HCT 24.2* 26.8*  PLT 360 376   Recent Labs    03/31/20 0618 04/01/20 0502  NA 134* 137  K 3.9 4.1  CL 101 104  CO2 25 25  GLUCOSE 96 97  BUN 18 20  CREATININE 1.41* 1.47*  CALCIUM 8.1* 8.4*    Intake/Output Summary (Last 24 hours) at 04/01/2020 0903 Last data filed at 04/01/2020 0530 Gross per 24 hour  Intake --  Output 500 ml  Net -500 ml        Physical Exam: BP 133/63 (BP Location: Right Arm)   Pulse 73   Temp 98.1 F (36.7 C) (Oral)   Resp 16   Ht 5\' 10"  (1.778 m)   Wt 81.7 kg   SpO2 95%   BMI 25.84 kg/m  Constitutional: No distress . Vital signs reviewed. HENT: Normocephalic.  Atraumatic. Eyes: EOMI. No discharge. Cardiovascular: No JVD.  RRR. Respiratory: Normal effort.  No stridor.  Bilateral clear to auscultation. GI: Non-distended.  BS +. Skin: Warm and dry.  Incision site sutures CDI Psych: Normal mood.  Normal behavior. Musc: TTP right tach, left gluteal Left lower extremity > right lower extremity peripheral edema Neuro: Alert Motor: Bilateral upper extremities: 4 -/5 proximal distal Bilateral lower extremities: 4/5 proximal distal  Assessment/Plan: 1. Functional deficits which  require 3+ hours per day of interdisciplinary therapy in a comprehensive inpatient rehab setting.  Physiatrist is providing close team supervision and 24 hour management of active medical problems listed below.  Physiatrist and rehab team continue to assess barriers to discharge/monitor patient progress toward functional and medical goals   Care Tool:  Bathing              Bathing assist       Upper Body Dressing/Undressing Upper body dressing        Upper body assist      Lower Body Dressing/Undressing Lower body dressing            Lower body assist       Toileting Toileting    Toileting assist Assist for toileting: Independent with assistive device Assistive Device Comment: urinal   Transfers Chair/bed transfer  Transfers assist           Locomotion Ambulation   Ambulation assist              Walk 10 feet activity   Assist           Walk 50 feet activity   Assist           Walk 150 feet activity   Assist           Walk 10 feet on uneven surface  activity   Assist  Wheelchair     Assist               Wheelchair 50 feet with 2 turns activity    Assist            Wheelchair 150 feet activity     Assist           Medical Problem List and Plan: 1.  Impaired mobility secondary to mitral valve repair on 03/11/2020 with pericardial effusion on 0000000, further complicated by atrial fibrillation.   Begin CIR evaluations  Team conference today to discuss current and goals and coordination of care, home and environmental barriers, and discharge planning with nursing, case manager, and therapies. Please see conference note from today as well.  2.  Antithrombotics: -DVT/anticoagulation:  Pharmaceutical: Coumadin  INR subtherapeutic on 12/29             -antiplatelet therapy: N/a 3. Pain Management:   Tramadol PRN.   Robaxin as needed started on 12/29 4. Mood: LCSW to  follow for evaluation and support.              -antipsychotic agents: N/a 5. Neuropsych: This patient is capable of making decisions on his own behalf. 6. Skin/Wound Care: Monitor wound for healing. Routine pressure relief measures.  7. Fluids/Electrolytes/Nutrition: Monitor I/Os.    Added multivitamin  8.  Fluid overload: Monitor weights daily and for other signs of overload  Lasix decreased to once/day on 12/27.   Low salt diet, added TEDs and elevation to help manage BLE edema.   On fluid restriciton Filed Weights   03/31/20 1500 04/01/20 0500  Weight: 82.8 kg 81.7 kg   9.  New onset A fib/PAF: Continue amiodarone, toprol XL. 10. Acute blood loss anemia:   Increased iron to bid   Hemoglobin 8.3 on 12/29  Continue to monitor 11. Hypothyroid: TSH- 6.5--new diagnosis   Synthroid started on 12/21  TSH to be rechecked in 4 weeks.   12. Acute kidney injury:  Scr improved from peak of 2.02  ~1.4 (?  New baseline)   Creatinine 1.47 on 12/29   Continue to monitor 13.  Anorexia: He has been refusing schedule supplements but intake improving. 14. OSA: Has not been using machine due to intolerance but plans on trialing it again after discharge.  15. Overweight: BMI 26- provide education.  16.  Severe hypoalbuminemia  Omentum that she had on 12/29  LOS: 1 days A FACE TO FACE EVALUATION WAS PERFORMED  Lena Gores Lorie Phenix 04/01/2020, 9:03 AM

## 2020-04-01 NOTE — Progress Notes (Signed)
Physical Therapy Session Note  Patient Details  Name: Patrick Bell MRN: [id number] Date of Birth: July 30, 1940  Today's Date: 04/01/2020 PT Individual Time: 1500-1535 PT Individual Time Calculation (min): 35 min   Short Term Goals: Week 1:  PT Short Term Goal 1 (Week 1): STG = LTG due to ELOS  Skilled Therapeutic Interventions/Progress Updates:    Patient received asleep in bed, easy to wake, agreeable to PT. He does report pain in L hip, around greater trochanter, but did not offer numerical rating. He did received pain rx prior to session and did have ice packs over pain site. He was able to come to sit edge of bed with CGA maintaining sternal precautions. Transfer to wc via stand pivot with CGA, obvious antalgic pattern noted due to L hip pain. PT propelling patient in wc to therapy gym for time management and energy conservation. PT providing prolonged stretch to L hip external rotators. He had significantly increased tone into external rotation, consistent with muscle spasm in the area. PT also providing prolonged deep pressure to insertion of hip external rotators and origin of TFL. Palpable muscle mass noted with rope-like consistency. With prolonged deep pressure, tone began to subside. Patient able to come to standing and maintain weight bearing on L LE with decreased reports of pain compared to earlier. Discussed with patient cycling heat and ice to assist in pain management and strategies for self-massage/stretching in a pain-tolerable fashion. He verbalized understanding. Patient transferring back to bed via stand pivot with CGA. Returning to supine with supervision. Bed alarm on, call light within reach.   Therapy Documentation Precautions:  Restrictions Weight Bearing Restrictions:  (sternal precautions)    Therapy/Group: Individual Therapy  Elizebeth Koller, PT, DPT, CBIS  04/01/2020, 7:52 AM

## 2020-04-01 NOTE — Evaluation (Signed)
Occupational Therapy Assessment and Plan  Patient Details  Name: Patrick Bell MRN: [id number] Date of Birth: [birth year]  OT Diagnosis: acute pain and muscle weakness (generalized) Rehab Potential: Rehab Potential (ACUTE ONLY): Excellent ELOS: 5-7 days   Today's Date: 04/01/2020 OT Individual Time: 0104-0215 OT Individual Time Calculation (min): 71 min     Hospital Problem: Principal Problem:   Debility Active Problems:   Hypoalbuminemia due to protein-calorie malnutrition ([place])   AKI (acute kidney injury) ([place])   Hypothyroidism   Acute blood loss anemia   Muscle pain   Subtherapeutic international normalized ratio (INR)   Past Medical History:  Past Medical History:  Diagnosis Date  . Acute thoracic aortic dissection (HCC) 03/11/2020   Type A  . Allergy   . Asthma   . Cataract   . Cataract   . Diverticulitis   . Dyspnea   . GERD (gastroesophageal reflux disease)   . Heart murmur 12/2019  . Hyperlipidemia   . Hypertension   . Inguinal hernia bilateral, non-recurrent   . S/P aortic dissection repair 03/11/2020   supracoronary straight graft repair with resuspension of native aortic valve and open distal anastomosis for intraoperative acute type A aortic dissection   . S/P mitral valve repair 03/11/2020   Complex valvuloplasty including artificial Gore-tex neochord placement x 4, suture plication of posterior leaflet and posteromedial commissure and 32 mm Sorin Memo 4D ring annuloplasty  . Sleep apnea    Past Surgical History:  Past Surgical History:  Procedure Laterality Date  . bilateral inguinal hernia    . CARDIAC CATHETERIZATION Bilateral 02/21/2020  . CHEST TUBE INSERTION N/A 03/22/2020   Procedure: CHEST TUBE INSERTION OF 28 BLAKE DRAIN.;  Surgeon: Grace Isaac, MD;  Location: [facility] OR;  Service: Thoracic;  Laterality: N/A;  . COLONOSCOPY  06/09/2004   WCB:JSEGBTD colon diverticulosis, otherwise normal colonoscopy  . EYE SURGERY  08/2014  . HERNIA REPAIR     . MITRAL VALVE REPAIR N/A 03/11/2020   Procedure: MITRAL VALVE REPAIR (MVR) USING MEMO 4D SIZE 32 RING;  Surgeon: Rexene Alberts, MD;  Location: [place];  Service: Open Heart Surgery;  Laterality: N/A;  . PALATE / UVULA BIOPSY / EXCISION    . PERICARDIAL WINDOW N/A 03/22/2020   Procedure: SUBXYPHOID POST-OP PERICARDIAL FLUID DRAINAGE WITH CHEST TUBE INSERTION.;  Surgeon: Grace Isaac, MD;  Location: [place];  Service: Thoracic;  Laterality: N/A;  . REPAIR OF ACUTE ASCENDING THORACIC AORTIC DISSECTION N/A 03/11/2020   Procedure: REPAIR OF ACUTE ASCENDING THORACIC AORTIC DISSECTION USING A HEMASHIELD PLATINUM 26MM STRAIGHT GRAFT AND A HEASHIELD PLATINUM 28X10MM SINGLE ARM GRAFT;  Surgeon: Rexene Alberts, MD;  Location: [place];  Service: Open Heart Surgery;  Laterality: N/A;  . RIGHT/LEFT HEART CATH AND CORONARY ANGIOGRAPHY N/A 02/21/2020   Procedure: RIGHT/LEFT HEART CATH AND CORONARY ANGIOGRAPHY;  Surgeon: Troy Sine, MD;  Location: [place] [facility];  Service: Cardiovascular;  Laterality: N/A;  . TEE WITHOUT CARDIOVERSION N/A 12/19/2019   Procedure: TRANSESOPHAGEAL ECHOCARDIOGRAM (TEE);  Surgeon: Buford Dresser, MD;  [facility];  Service: Cardiovascular;  Laterality: N/A;  . TEE WITHOUT CARDIOVERSION N/A 03/11/2020   Procedure: TRANSESOPHAGEAL ECHOCARDIOGRAM (TEE);  Surgeon: Rexene Alberts, MD;  Location: [place];  Service: Open Heart Surgery;  Laterality: N/A;  . TEE WITHOUT CARDIOVERSION N/A 03/22/2020   Procedure: TRANSESOPHAGEAL ECHOCARDIOGRAM (TEE);  Surgeon: Grace Isaac, MD;  Location: [facility] OR;  Service: Thoracic;  Laterality: N/A;  . TONSILLECTOMY    .  VASECTOMY      Assessment & Plan Clinical Impression: [name] is an 79 year old male with history of HTN, severe OSA--not using CPAP, MVP with severe regurgitation and mild SOB with non-exertional CP for the past few months. He was admitted on 03/11/20 for MVR and repair of iatrogenic acute type A  aortic dissection by Dr. Roxy Manns. Peri- operative course significant for severe coagulopathy treated with platelets, cryoprecipitate, FFP and recombinant factor VII.  He tolerated extubation without difficulty on 12/09 but hospital course significant for fluid overload requiring intermittent diuresis, acute renal failure, orthostatic hypotension with dizziness, anorexia, ABLA as well as onset of A fib with RVR.   Dr. Jenetta Downer' Nori Riis consulted f12/18 for management of A fib due to soft BP and patient treated with IV albumin in addition to midodrine for BP support. Follow up 2D echo showed large pericardial effusion and he underwent subxiphoid window with finding of 250-300 cc dark blood and placement of blake drain on 12/19 by Dr. Servando Snare.  He has converted to NSR with amiodarone load and rate controlled with addition of Toprol XL. Follow up 2 D echo 12/25 showed trivial effusion with EF 60-65%.  He has been transitioned to po lasix with recommendations tom monitor renal status and small bilateral pleural effusions being managed with pulmonary hygiene. Po intake improving with intermittent nausea due to meds. Medically optimized but noted to have weakness due to debility. Admitted to CIR on 03/31/20 with impaired mobility and ADLs. .  Patient transferred to CIR on 03/31/2020 .    Patient currently requires min with basic self-care skills secondary to muscle weakness, decreased cardiorespiratoy endurance and decreased sitting balance, decreased standing balance and decreased balance strategies.  Prior to hospitalization, patient could complete ADLs and IADLs with independent .  Patient will benefit from skilled intervention to increase independence with basic self-care skills prior to discharge home with care partner.  Anticipate patient will require 24 hour supervision and follow up home health and no further OT follow recommended.  OT - End of Session Activity Tolerance: Tolerates 30+ min activity with multiple  rests Endurance Deficit: Yes Endurance Deficit Description: Mild shortness of breath after completing simple self care tasks in standing. Benefits from brief seated recoveries OT Assessment Rehab Potential (ACUTE ONLY): Excellent OT Patient demonstrates impairments in the following area(s): Balance;Safety;Endurance;Pain OT Basic ADL's Functional Problem(s): Grooming;Bathing;Dressing;Toileting OT Transfers Functional Problem(s): Toilet;Tub/Shower OT Additional Impairment(s): Fuctional Use of Upper Extremity OT Plan OT Intensity: Minimum of 1-2 x/day, 45 to 90 minutes OT Frequency: 5 out of 7 days OT Duration/Estimated Length of Stay: 5-7 days OT Treatment/Interventions: Balance/vestibular training;Discharge planning;Pain management;Self Care/advanced ADL retraining;Therapeutic Activities;Functional mobility training;Patient/family education;Therapeutic Exercise;DME/adaptive equipment instruction;UE/LE Strength taining/ROM OT Basic Self-Care Anticipated Outcome(s): supervision- mod I OT Toileting Anticipated Outcome(s): supervision OT Bathroom Transfers Anticipated Outcome(s): supervision OT Recommendation Patient destination: Home Follow Up Recommendations: 24 hour supervision/assistance Equipment Recommended: To be determined Equipment Details: Pt does not currently own any DME   OT Evaluation Precautions/Restrictions  Precautions Precautions: Sternal;Fall Precaution Comments: reviewed "move in the tube" and no hard pushing/pulling with UEs, Restrictions RUE Weight Bearing: Weight bearing as tolerated LUE Weight Bearing: Weight bearing as tolerated Other Position/Activity Restrictions: Sternal precautions General Chart Reviewed: Yes Pain Pain Assessment Pain Scale: 0-10 Pain Score: 7  Pain Type: Acute pain Pain Location: Hip Pain Orientation: Lateral;Left Pain Descriptors / Indicators: Sharp;Cramping Pain Onset: With Activity Patients Stated Pain Goal: 0 Pain  Intervention(s): Repositioned;Cold applied Multiple Pain Sites: No Home Living/Prior  Functioning Home Living Family/patient expects to be discharged to:: Private residence Living Arrangements: Spouse/significant other Available Help at Discharge: Family,Available 24 hours/day Type of Home: House Home Access: Stairs to enter [employer] of Steps: 3 Entrance Stairs-Rails: Left Home Layout: Two level,Able to live on main level with bedroom/bathroom,Full bath on main level Bathroom Shower/Tub: Multimedia programmer: Handicapped height Bathroom Accessibility: Yes  Lives With: Spouse Prior Function Level of Independence: Independent with transfers,Independent with gait,Independent with basic ADLs,Independent with homemaking with ambulation  Able to Take Stairs?: Yes Driving: Yes 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 Leisure: Hobbies-yes (Comment) Comments: Enjoys hiking in the woods Vision Baseline Vision/History: Wears glasses Wears Glasses: At all times Patient Visual Report: No change from baseline Vision Assessment?: No apparent visual deficits Perception  Perception: Within Functional Limits Praxis Praxis: Intact Cognition Overall Cognitive Status: Within Functional Limits for tasks assessed Arousal/Alertness: Awake/alert Orientation Level: Person;Situation;Place Person: Oriented Place: Oriented Situation: Oriented Year: 2021 Month: December Day of Week: Correct Memory: Appears intact Immediate Memory Recall: Sock;Blue;Bed Memory Recall Sock: Without Cue Memory Recall Blue: Without Cue Memory Recall Bed: Without Cue Attention: Sustained;Selective Focused Attention: Appears intact Sustained Attention: Appears intact Selective Attention: Appears intact Awareness: Appears intact Problem Solving: Appears intact Safety/Judgment: Appears intact Sensation Sensation Light Touch: Appears Intact Hot/Cold: Appears  Intact Proprioception: Appears Intact Stereognosis: Appears Intact Coordination Gross Motor Movements are Fluid and Coordinated: Yes Fine Motor Movements are Fluid and Coordinated: Yes Motor  Motor Motor: Within Functional Limits Motor - Skilled Clinical Observations: General deconditioning s/p MVP and aortic repair. Pain limiting with L hip (muscle cramping)  Trunk/Postural Assessment  Cervical Assessment Cervical Assessment: Exceptions to [facility] (forward head) Thoracic Assessment Thoracic Assessment: Exceptions to [facility] (rounded shoulders) Lumbar Assessment Lumbar Assessment: Exceptions to [facility] (posterior pelvic tilt) Postural Control Postural Control: Within Functional Limits  Balance Balance Balance Assessed: Yes Static Sitting Balance Static Sitting - Balance Support: Feet supported Static Sitting - Level of Assistance: 5: Stand by assistance Dynamic Sitting Balance Dynamic Sitting - Balance Support: Feet supported;During functional activity Dynamic Sitting - Level of Assistance: 5: Stand by assistance Static Standing Balance Static Standing - Balance Support: During functional activity Static Standing - Level of Assistance: 4: Min assist Dynamic Standing Balance Dynamic Standing - Balance Support: During functional activity Dynamic Standing - Level of Assistance: 4: Min assist Extremity/Trunk Assessment RUE Assessment RUE Assessment: Within Functional Limits General Strength Comments: not fully assessed due to sternal precautions; able to complete light activity within restrictions LUE Assessment LUE Assessment: Within Functional Limits General Strength Comments: not fully assessed due to sternal precautions; able to complete light activity within restrictions  Care Tool Care Tool Self Care Eating   Eating Assist Level: Independent    Oral Care    Oral Care Assist Level: Supervision/Verbal cueing (standing sinkside)    Bathing   Body parts bathed by patient: Right  arm;Left arm;Front perineal area;Buttocks;Right upper leg;Left upper leg;Face   Body parts n/a: Left lower leg;Right lower leg;Abdomen;Chest Assist Level: Minimal Assistance - Patient > 75%    Upper Body Dressing(including orthotics)   What is the patient wearing?: Pull over shirt   Assist Level: Minimal Assistance - Patient > 75%    Lower Body Dressing (excluding footwear)   What is the patient wearing?: Pants;Incontinence brief Assist for lower body dressing: Moderate Assistance - Patient 50 - 74%    Putting on/Taking off footwear   What is the patient wearing?: Ted hose;Non-skid slipper socks Assist for footwear: Total Assistance -  Patient < 25%       Care Tool Toileting Toileting activity   Assist for toileting: Moderate Assistance - Patient 50 - 74%     Care Tool Bed Mobility Roll left and right activity        Sit to lying activity   Sit to lying assist level: Supervision/Verbal cueing    Lying to sitting edge of bed activity   Lying to sitting edge of bed assist level: Contact Guard/Touching assist     Care Tool Transfers Sit to stand transfer   Sit to stand assist level: Contact Guard/Touching assist Sit to stand assistive device: Armrests  Chair/bed transfer   Chair/bed transfer assist level: Contact Guard/Touching assist     Toilet transfer   Assist Level: Contact Guard/Touching assist     Care Tool Cognition Expression of Ideas and Wants Expression of Ideas and Wants: Without difficulty (complex and basic) - expresses complex messages without difficulty and with speech that is clear and easy to understand   Understanding Verbal and Non-Verbal Content Understanding Verbal and Non-Verbal Content: Understands (complex and basic) - clear comprehension without cues or repetitions   Memory/Recall Ability *first 3 days only Memory/Recall Ability *first 3 days only: Current season;Location of own room;Staff names and faces;That he or she is in a hospital/hospital  unit    Refer to Care Plan for [place] 1 OT Short Term Goal 1 (Week 1): STGs=LTGs due to ELOS  Recommendations for other services: None    Skilled Therapeutic Intervention ADL ADL Grooming: Supervision/safety Where Assessed-Grooming: Standing at sink Upper Body Bathing: Minimal assistance Where Assessed-Upper Body Bathing: Sitting at sink Lower Body Bathing: Moderate assistance Where Assessed-Lower Body Bathing: Sitting at sink;Standing at sink Upper Body Dressing: Minimal assistance Where Assessed-Upper Body Dressing: Sitting at sink Lower Body Dressing: Moderate assistance Where Assessed-Lower Body Dressing: Standing at sink;Sitting at sink Mobility  Bed Mobility Bed Mobility: Rolling Right;Rolling Left;Right Sidelying to Sit Rolling Right: Minimal Assistance - Patient > 75% Rolling Left: Minimal Assistance - Patient > 75% Right Sidelying to Sit: Minimal Assistance - Patient > 75% Transfers Sit to Stand: Minimal Assistance - Patient > 75% Stand to Sit: Minimal Assistance - Patient > 75%  Skilled Intervention:  Pt supine in bed, agreeable to OT session.  Initial evaluation completed, educated pt regarding OT scope of practice, and collaborated with pt regarding OT POC.  Pt completed self care and functional mobility per above levels of assist required.  Pt c/o sharp pain in left hip during all functional mobility therefore completed gentle PROM left hip to relieve pain and applied CP.  Pt supine in bed, call bell in reach, bed alarm on at end of session.    Discharge Criteria: Patient will be discharged from OT if patient refuses treatment 3 consecutive times without medical reason, if treatment goals not met, if there is a change in medical status, if patient makes no progress towards goals or if patient is discharged from hospital.  The above assessment, treatment plan, treatment alternatives and goals were discussed and mutually agreed upon: by  patient  Patrick Bell 04/01/2020, 4:50 PM

## 2020-04-01 NOTE — Progress Notes (Signed)
[address]       [place] [address]             [phone]     CARDIOTHORACIC SURGERY PROGRESS NOTE  Subjective: No complaints.  In good spirits.    Objective: Vital signs in last 24 hours: Temp:  [98.1 F (36.7 C)-98.9 F (37.2 C)] 98.1 F (36.7 C) (12/29 1413) Pulse Rate:  [72-75] 72 (12/29 1413) Resp:  [16-17] 17 (12/29 1413) BP: (118-141)/(59-65) 118/60 (12/29 1413) SpO2:  [95 %-98 %] 98 % (12/29 1413) Weight:  [81.7 kg-82.8 kg] 81.7 kg (12/29 0500)  Physical Exam:  Rhythm:   regular  Breath sounds: clear  Heart sounds:  RRR w/out murmur  Incisions:  Clean and dry  Abdomen:  Soft, non-distended, non-tender  Extremities:  Warm, well-perfused   Intake/Output from previous day: 12/28 0701 - 12/29 0700 In: -  Out: 500 [Urine:500] Intake/Output this shift: Total I/O In: 440 [P.O.:440] Out: 100 [Urine:100]  Lab Results: Recent Labs    03/31/20 0618 04/01/20 0502  WBC 7.2 6.1  HGB 8.1* 8.3*  HCT 24.2* 26.8*  PLT 360 376   BMET:  Recent Labs    03/31/20 0618 04/01/20 0502  NA 134* 137  K 3.9 4.1  CL 101 104  CO2 25 25  GLUCOSE 96 97  BUN 18 20  CREATININE 1.41* 1.47*  CALCIUM 8.1* 8.4*    CBG (last 3)  Recent Labs    03/30/20 1140 03/30/20 1700 03/31/20 1137  GLUCAP 83 93 79   PT/INR:   Recent Labs    04/01/20 0502  LABPROT 18.8*  INR 1.6*     ECHOCARDIOGRAM LIMITED REPORT    Patient Name:  Patrick Bell Date of Exam: 03/28/2020  Medical Rec #: [id number]    Height:    70.0 in  Accession #:  [id number]   Weight:    200.2 lb  Date of Birth: 02/12/1941    BSA:     2.088 m  Patient Age:  79 years    BP:      126/63 mmHg  Patient Gender: M        HR:      77 bpm.  Exam Location: Inpatient   Procedure: Limited Echo, Cardiac Doppler and Color Doppler   Indications:  I31.3 Pericardial effusion (noninflammatory)    History:    Patient has prior  history of Echocardiogram examinations,  most         recent 03/21/2020. Aortic Dissection, Aortic Valve  Disease,         Mitral Valve Disease and Mitral Valve Prolapse; Risk         Factors:Hypertension, Sleep Apnea and Dyslipidemia. Mitral  valve         repair. Aortic dissection repair. Pericaredial effusion.           Mitral Valve: 32 mm prosthetic annuloplasty ring valve is         present in the mitral position. Procedure Date: 03/11/2020.    Sonographer:  Roseanna Rainbow RDCS  Referring Phys: [id number] [place]    1. Limited echo for pericardial effusion. A trivial effusion is now  present. NSR is now present.  2. Left ventricular ejection fraction, by estimation, is 60 to 65%. The  left ventricle has normal function.  3. Right ventricular systolic function is normal. The right ventricular  size is normal.  4. The mitral valve has been repaired/replaced. No  evidence of mitral  valve regurgitation. The mean mitral valve gradient is 5.0 mmHg with  average heart rate of 76 bpm. There is a 32 mm prosthetic annuloplasty  ring present in the mitral position.  Procedure Date: 03/11/2020.  5. The aortic valve is grossly normal.  6. The inferior vena cava is dilated in size with >50% respiratory  variability, suggesting right atrial pressure of 8 mmHg.   Comparison(s): Changes from prior study are noted. Effusion is now  trivial.   FINDINGS  Left Ventricle: Left ventricular ejection fraction, by estimation, is 60  to 65%. The left ventricle has normal function. Abnormal (paradoxical)  septal motion consistent with post-operative status.   Right Ventricle: The right ventricular size is normal. No increase in  right ventricular wall thickness. Right ventricular systolic function is  normal.   Left Atrium: Left atrial size was normal in size.   Right Atrium: Right atrial size was normal in size.    Pericardium: Trivial pericardial effusion is present. Presence of  pericardial fat pad.   Mitral Valve: The mitral valve has been repaired/replaced. There is a 32  mm prosthetic annuloplasty ring present in the mitral position. Procedure  Date: 03/11/2020. MV peak gradient, 10.1 mmHg. The mean mitral valve  gradient is 5.0 mmHg with average heart  rate of 76 bpm.   Tricuspid Valve: The tricuspid valve is grossly normal. Tricuspid valve  regurgitation is trivial. No evidence of tricuspid stenosis.   Aortic Valve: The aortic valve is grossly normal.   Aorta: The aortic root and ascending aorta are structurally normal, with  no evidence of dilitation.   Venous: The inferior vena cava is dilated in size with greater than 50%  respiratory variability, suggesting right atrial pressure of 8 mmHg.   IAS/Shunts: The atrial septum is grossly normal.   Additional Comments: There is a small pleural effusion in the left lateral  region.    LV Volumes (MOD)  LV vol d, MOD A2C: 98.1 ml  LV vol d, MOD A4C: 85.9 ml  LV vol s, MOD A2C: 37.4 ml  LV vol s, MOD A4C: 32.8 ml  LV SV MOD A2C:   60.7 ml  LV SV MOD A4C:   85.9 ml  LV SV MOD BP:   55.9 ml   IVC  IVC diam: 2.50 cm     AORTA  Ao Asc diam: 2.90 cm   MITRAL VALVE  MV Peak grad: 10.1 mmHg  MV Mean grad: 5.0 mmHg  MV Vmax:   1.59 m/s  MV Vmean:   105.0 cm/s   Lennie Odor MD  Electronically signed by Lennie Odor MD  Signature Date/Time: 03/28/2020/4:40:44 PM     CXR:  CHEST - 2 VIEW  COMPARISON:  Three days ago  FINDINGS: Unchanged small bilateral pleural effusion. Stable heart size. Mitral valve repair. No pulmonary edema or pneumothorax. Left-sided PICC with tip at the upper cavoatrial junction.  IMPRESSION: Small bilateral pleural effusion.  No pneumothorax.   Electronically Signed   By: Marnee Spring M.D.   On: 03/29/2020 06:17  Assessment/Plan:  Making good progress. Appreciate  care from entire CIR team. Will follow intermittently and arrange for f/u appointments after d/c home when appropriate, including f/u CTA chest/abd/pelvis in one month.   Purcell Nails, MD 04/01/2020 2:23 PM

## 2020-04-01 NOTE — Patient Care Conference (Signed)
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 Maryla Morrow Care Coodinator Present: Chana Bode, RN, BSN, CRRN;Becky Dupree, LCSW Nurse Present: Jesusita Oka, LPN PT Present: Wynelle Link, PT OT Present: Dolphus Jenny, OT PPS Coordinator present : Edson Snowball, Park Breed, SLP     Current Status/Progress Goal Weekly Team Focus  Bowel/Bladder   continent of B/B , Last BM-12/28  maintain continence  Assess Qshift and PRN   Swallow/Nutrition/ Hydration             ADL's             Mobility             Communication             Safety/Cognition/ Behavioral Observations            Pain   reports pain 4of 10 to surgical site  pain<3  Assess painQ shift and prn   Skin   incision site to chest, incision to mid upper abdomen( 4 sites with sutures) R side flank -2 incisions with sutures(s/p chest tube removal), incision to groin area(r side).  healing of incisions sites.  Assess Q shhift and PRN     Discharge Planning:  Home with wife who can assist, pt wants to be mod/i at discharge and get back to his active lifestyle   Team Discussion: Left hip pain limits participation;  MD to address Patient on target to meet rehab goals: Initial evals incomplete at the time of conference  *See Care Plan and progress notes for long and short-term goals.   Revisions to Treatment Plan:   Teaching Needs:   Current Barriers to Discharge: supportive wife  Possible Resolutions to Barriers: Family education     Medical Summary Current Status: Impaired mobility secondary to mitral valve repair on 03/11/2020 with pericardial effusion on 03/22/2020, further complicated by atrial fibrillation.  Barriers to Discharge: Medical stability  Barriers to Discharge Comments: AKI, new onset- Afib, muscle pain, subtherapeutic INR Possible Resolutions to [employer] Focus: Therapies, optimize pain meds, follow INR, Hb, Cr   Continued Need for Acute Rehabilitation Level of Care: The patient requires daily medical management by a physician with specialized training in physical medicine and rehabilitation for the following reasons: Direction of a multidisciplinary physical rehabilitation program to maximize functional independence : Yes Medical management of patient stability for increased activity during participation in an intensive rehabilitation regime.: Yes Analysis of laboratory values and/or radiology reports with any subsequent need for medication adjustment and/or medical intervention. : Yes   I attest that I was present, lead the team conference, and concur with the assessment and plan of the team.   Lambert Mody,  Dalbert Batman 04/01/2020, 3:18 PM

## 2020-04-01 NOTE — Progress Notes (Signed)
Initial Nutrition Assessment  RD working remotely.  DOCUMENTATION CODES:   Not applicable  INTERVENTION:   - Ensure Max po BID, each supplement provides 150 kcal and 30 grams of protein  - ProSource Plus 30 ml po BID, each supplement provides 100 kcal and 15 grams of protein  - MVI with minerals daily  - Provided diet education  - d/c Boost Breeze  NUTRITION DIAGNOSIS:   Increased nutrient needs related to post-op healing as evidenced by estimated needs.  GOAL:   Patient will meet greater than or equal to 90% of their needs  MONITOR:   PO intake,Supplement acceptance,Labs,Weight trends,Skin,I & O's  REASON FOR ASSESSMENT:   Consult Diet education  ASSESSMENT:   79 year old male with PMH of HTN, severe OSA, MVP with severe regurgitation. Pt was admitted on 03/11/20 for MVR and repair of iatrogenic acute type A aortic dissection. Hospital course significant for fluid overload requiring intermittent diuresis, acute renal failure, orthostatic hypotension with dizziness, and poor appetite. Pt underwent subxiphoid window with finding of 250-300 cc dark blood and placement of blake drain on 12/19. Admitted to CIR on 12/28 with impaired mobility and ADLs.   Pt currently on a Heart Healthy diet with 1200 ml fluid restriction.  Spoke with pt via phone call to room. Pt reports that he did not have much of an appetite for breakfast this morning due to sleeping poorly overnight. Noted meal completion for breakfast today charted as 60%. Pt states that he is ready for lunch and is feeling hungry. He thinks that he will be able to eat a good portion of the spaghetti that he ordered.  Pt reports an episode of vomiting this morning related to taking medications. Pt states that one of the medications he takes is large and "gets stuck" in his throat so his reflex is to gag to get it back up which then causes some vomiting. RD encouraged pt to ask for PRN nausea medications if he does feel  nauseated.  Pt reports some weight loss since admission to acute care but attributes this to fluid losses. He denies any longer term weight loss. Reviewed weight history in chart. Pt with a 7 kg weight loss since 05/17/19. This is a 7.9% weight loss in 10 months which is not significant for timeframe. Per RN edema assessment, pt with non-pitting edema to BLE. This may be masking additional weight loss. RD will monitor weights during CIR admission.  Pt does not like the Boost Breeze supplements. RD to d/c these. Pt does like the Ensure supplements. Noted MD ordered ProSource Plus. RD will monitor for supplement acceptance and adjust supplement regimen as appropriate.  Discussed with pt the importance of adequate PO intake with a focus on protein-rich foods in maintaining lean muscle mass and preventing dry weight loss. Pt expresses understanding. Provided education on ways for pt to increase protein intake. Pt has a desire to get stronger and is going to focus on consuming adequate nutrition.  Meal Completion: 60% x 1 documented meal  Medications reviewed and include: ProSource Plus BID, colace, Boost Breeze TID, trinsicon, lasix, MVI with minerals, protonix, klor-con, Ensure Max TID, warfarin  Labs reviewed: creatinine 1.47, hemoglobin 8.3  NUTRITION - FOCUSED PHYSICAL EXAM:  Unable to complete at this time. RD working remotely.  Diet Order:   Diet Order            Diet Heart Room service appropriate? Yes; Fluid consistency: Thin; Fluid restriction: 1200 mL Fluid  Diet effective now  EDUCATION NEEDS:   Education needs have been addressed  Skin:  Skin Assessment: Skin Integrity Issues: Incisions: chest, abdomen, groin  Last BM:  03/31/20  Height:   Ht Readings from Last 1 Encounters:  03/31/20 5\' 10"  (1.778 m)    Weight:   Wt Readings from Last 1 Encounters:  04/01/20 81.7 kg    BMI:  Body mass index is 25.84 kg/m.  Estimated Nutritional Needs:    Kcal:  2000-2200  Protein:  100-120 grams  Fluid:  1.8 L/day    Gustavus Bryant, MS, RD, LDN Inpatient Clinical Dietitian Please see AMi[facility] for contact information.

## 2020-04-01 NOTE — Progress Notes (Signed)
Patient vomit times one when attempting to swallow half of potassium pill but refused to have it dissolved. No further complications noted at this time. Wife at bedside. Jay Schlichter, LPN

## 2020-04-01 NOTE — IPOC Note (Signed)
Individualized overall Plan of Care (IPOC) Patient Details Name: Patrick Bell MRN: [id number] DOB: 01-Dec-1940  Admitting Diagnosis: [place] Hospital Problems: Principal Problem:   Debility Active Problems:   Hypoalbuminemia due to protein-calorie malnutrition ([place])   AKI (acute kidney injury) ([place])   Hypothyroidism   Acute blood loss anemia   Muscle pain   Subtherapeutic international normalized ratio (INR)     Functional Problem List: Nursing Bladder,Bowel,Pain,Edema,Safety,Endurance,Medication Management,Skin Integrity  PT Pain,Balance,Motor,Endurance,Edema,Skin Integrity  OT Balance,Safety,Endurance,Pain  SLP    TR         Basic ADL's: OT Grooming,Bathing,Dressing,Toileting     Advanced  ADL's: OT       Transfers: PT Bed Mobility,Bed to Chair,Car  OT Toilet,Tub/Shower     Locomotion: PT Ambulation,Stairs     Additional Impairments: OT Fuctional Use of Upper Extremity  SLP        TR      Anticipated Outcomes Item Anticipated Outcome  Self Feeding    Swallowing      Basic self-care  supervision- mod I  Toileting  supervision   Bathroom Transfers supervision  Bowel/Bladder  pt will be continent x 2  Transfers  mod I with LRAD  Locomotion  supervision with LRAD  Communication     Cognition     Pain  less than 3 out of 10 on pain scale  Safety/Judgment  pt will be fall free while in rehab   Therapy Plan: PT Intensity: Minimum of 1-2 x/day ,45 to 90 minutes PT Frequency: 5 out of 7 days PT Duration Estimated Length of Stay: 7-10 days OT Intensity: Minimum of 1-2 x/day, 45 to 90 minutes OT Frequency: 5 out of 7 days OT Duration/Estimated Length of Stay: 5-7 days      Team Interventions: Nursing Interventions Patient/Family Education,Disease Management/Prevention,Skin Care/Wound Management,Discharge Planning,Bladder Management,Pain Management,Bowel Management,Medication Management  PT interventions Ambulation/gait  training,Balance/vestibular training,Community reintegration,Discharge planning,Disease management/prevention,DME/adaptive equipment instruction,Cognitive remediation/compensation,Functional mobility training,Pain management,Psychosocial support,Splinting/orthotics,Therapeutic Activities,UE/LE Strength taining/ROM,Visual/perceptual remediation/compensation,Wheelchair propulsion/positioning,UE/LE Coordination activities,Therapeutic Exercise,Skin care/wound management,Stair training,Patient/family education,Neuromuscular re-education  OT Interventions Balance/vestibular training,Discharge planning,Pain management,Self Care/advanced ADL retraining,Therapeutic Activities,Functional mobility training,Patient/family education,Therapeutic Exercise,DME/adaptive equipment instruction,UE/LE Strength taining/ROM  SLP Interventions    TR Interventions    SW/CM Interventions Discharge Planning,Psychosocial Support,Patient/Family Education   Barriers to Discharge MD  Medical stability, Wound care, and Orthostasis  Nursing      PT Insurance for SNF coverage,Other (comments) Sternal precautions, L hip pain  OT      SLP      SW       Team Discharge Planning: Destination: PT-Home ,OT- Home , SLP-  Projected Follow-up: PT-Outpatient PT,24 hour supervision/assistance, OT-  24 hour supervision/assistance, SLP-  Projected Equipment Needs: PT-To be determined, OT- To be determined, SLP-  Equipment Details: PT- , OT-Pt does not currently own any DME Patient/family involved in discharge planning: PT- Patient,  OT-Patient, SLP-   MD ELOS: 5-8 days. Medical Rehab Prognosis:  Good Assessment: 79 year old male with history of HTN, severe OSA--not using CPAP, MVP with severe regurgitation and mild SOB with non-exertional CP for the past few months. He was admitted on 03/11/20 for MVR and repair of iatrogenic acute type A aortic dissection by Dr. Roxy Manns. Peri- operative course significant for severe coagulopathy treated  with platelets, cryoprecipitate, FFP and recombinant factor VII.  He tolerated extubation without difficulty on 12/09 but hospital course significant for fluid overload requiring intermittent diuresis, acute renal failure, orthostatic hypotension with dizziness, anorexia, ABLA as well as onset of A fib with  RVR. Dr. Val Eagle' Jennette Kettle consulted on 12/18 for management of A fib due to soft BP and patient treated with IV albumin in addition to midodrine for BP support. Follow up 2D echo showed large pericardial effusion and he underwent subxiphoid window with finding of 250-300 cc dark blood and placement of blake drain on 12/19 by Dr. Tyrone Sage.  He has converted to NSR with amiodarone load and rate controlled with addition of Toprol XL. Follow up 2 D echo 12/25 showed trivial effusion with EF 60-65%.  He has been transitioned to po lasix with recommendations tom monitor renal status and small bilateral pleural effusions being managed with pulmonary hygiene. Po intake improving with intermittent nausea due to meds. Medically optimized but noted to have weakness due to debility. Admitted to CIR on 03/31/20 with impaired mobility and ADLs.  Patient with resulting functional deficits with mobility, endurance, self-care.  We will set goals for Supervision with PT/OT.  Due to the current state of emergency, patients may not be receiving their 3-hours of Medicare-mandated therapy.  See Team Conference Notes for weekly updates to the plan of care

## 2020-04-01 NOTE — Evaluation (Signed)
Physical Therapy Assessment and Plan  Patient Details  Name: Patrick Bell MRN: [id number] Date of Birth: 01/09/1941  PT Diagnosis: Abnormality of gait, Difficulty walking, Edema, Muscle spasms, Muscle weakness and Pain in L hip Rehab Potential: Good ELOS: 7-10 days   Today's Date: 04/01/2020 PT Individual Time: 0800-0900 + 1000-1100 PT Individual Time Calculation (min): 60 min  + 49mn  Hospital Problem: Principal Problem:   Debility Active Problems:   Hypoalbuminemia due to protein-calorie malnutrition ([facility]   AKI (acute kidney injury) ([facility]   Hypothyroidism   Acute blood loss anemia   Muscle pain   Subtherapeutic international normalized ratio (INR)   Past Medical History:  Past Medical History:  Diagnosis Date   Acute thoracic aortic dissection ([place] 03/11/2020   Type A   Allergy    Asthma    Cataract    Cataract    Diverticulitis    Dyspnea    GERD (gastroesophageal reflux disease)    Heart murmur 12/2019   Hyperlipidemia    Hypertension    Inguinal hernia bilateral, non-recurrent    S/P aortic dissection repair 03/11/2020   supracoronary straight graft repair with resuspension of native aortic valve and open distal anastomosis for intraoperative acute type A aortic dissection    S/P mitral valve repair 03/11/2020   Complex valvuloplasty including artificial Gore-tex neochord placement x 4, suture plication of posterior leaflet and posteromedial commissure and 32 mm Sorin Memo 4D ring annuloplasty   Sleep apnea    Past Surgical History:  Past Surgical History:  Procedure Laterality Date   bilateral inguinal hernia     CARDIAC CATHETERIZATION Bilateral 02/21/2020   CHEST TUBE INSERTION N/A 03/22/2020   Procedure: CHEST TUBE INSERTION OF 28 BLAKE DRAIN.;  Surgeon: GGrace Isaac MD;  [place] [facility];  Service: Thoracic;  Laterality: N/A;   COLONOSCOPY  06/09/2004   NVQQ:VZDGLOVcolon diverticulosis, otherwise normal colonoscopy   EYE  SURGERY  08/2014   HERNIA REPAIR     MITRAL VALVE REPAIR N/A 03/11/2020   Procedure: MITRAL VALVE REPAIR (MVR) USING MEMO 4D SIZE 32 RING;  Surgeon: ORexene Alberts MD;  [facility];  Service: Open Heart Surgery;  Laterality: N/A;   PALATE / UVULA BIOPSY / EXCISION     PERICARDIAL WINDOW N/A 03/22/2020   Procedure: SUBXYPHOID POST-OP PERICARDIAL FLUID DRAINAGE WITH CHEST TUBE INSERTION.;  Surgeon: GGrace Isaac MD;  [place] [facility]  Service: Thoracic;  Laterality: N/A;   REPAIR OF ACUTE ASCENDING THORACIC AORTIC DISSECTION N/A 03/11/2020   Procedure: REPAIR OF ACUTE ASCENDING THORACIC AORTIC DISSECTION USING A HEMASHIELD PLATINUM 26MM STRAIGHT GRAFT AND A HEASHIELD PLATINUM 28X10MM SINGLE ARM GRAFT;  Surgeon: ORexene Alberts MD;  [facility]  Service: Open Heart Surgery;  Laterality: N/A;   RIGHT/LEFT HEART CATH AND CORONARY ANGIOGRAPHY N/A 02/21/2020   Procedure: RIGHT/LEFT HEART CATH AND CORONARY ANGIOGRAPHY;  Surgeon: KTroy Sine MD;  [facility];  Service: Cardiovascular;  Laterality: N/A;   TEE WITHOUT CARDIOVERSION N/A 12/19/2019   Procedure: TRANSESOPHAGEAL ECHOCARDIOGRAM (TEE);  Surgeon: CBuford Dresser MD;  [facility];  Service: Cardiovascular;  Laterality: N/A;   TEE WITHOUT CARDIOVERSION N/A 03/11/2020   Procedure: TRANSESOPHAGEAL ECHOCARDIOGRAM (TEE);  Surgeon: ORexene Alberts MD;  [place] [facility]  Service: Open Heart Surgery;  Laterality: N/A;   TEE WITHOUT CARDIOVERSION N/A 03/22/2020   Procedure: TRANSESOPHAGEAL ECHOCARDIOGRAM (TEE);  Surgeon: GGrace Isaac MD;  [place] [facility]  Service: Thoracic;  Laterality: N/A;  TONSILLECTOMY     VASECTOMY      Assessment & Plan Clinical Impression: Patient is  79 year old male with history of HTN, severe OSA--not using CPAP, MVP with severe regurgitation and mild SOB with non-exertional CP for the past few months. He was admitted on 03/11/20 for MVR and repair of  iatrogenic acute type A aortic dissection by Dr. Roxy Manns. Peri- operative course significant for severe coagulopathy treated with platelets, cryoprecipitate, FFP and recombinant factor VII.  He tolerated extubation without difficulty on 12/09 but hospital course significant for fluid overload requiring intermittent diuresis, acute renal failure, orthostatic hypotension with dizziness, anorexia, ABLA as well as onset of A fib with RVR.   Dr. Jenetta Downer' Nori Riis consulted f12/18 for management of A fib due to soft BP and patient treated with IV albumin in addition to midodrine for BP support. Follow up 2D echo showed large pericardial effusion and he underwent subxiphoid window with finding of 250-300 cc dark blood and placement of blake drain on 12/19 by Dr. Servando Snare.  He has converted to NSR with amiodarone load and rate controlled with addition of Toprol XL. Follow up 2 D echo 12/25 showed trivial effusion with EF 60-65%.  He has been transitioned to po lasix with recommendations tom monitor renal status and small bilateral pleural effusions being managed with pulmonary hygiene. Po intake improving with intermittent nausea due to meds. Medically optimized but noted to have weakness due to debility. Patient transferred to CIR on 03/31/2020 .   Patient currently requires min with mobility secondary to muscle weakness, decreased cardiorespiratoy endurance and decreased standing balance and difficulty maintaining precautions.  Prior to hospitalization, patient was independent  with mobility and lived with Spouse in a House home.  Home access is 3Stairs to enter.  Patient will benefit from skilled PT intervention to maximize safe functional mobility, minimize fall risk and decrease caregiver burden for planned discharge home with 24 hour supervision.  Anticipate patient will benefit from follow up OP at discharge.  PT - End of Session Activity Tolerance: Tolerates 10 - 20 min activity with multiple rests Endurance Deficit:  Yes Endurance Deficit Description: Mild shortness of breath after completing simple functional mobility tasks. Vitals stable and benefits from brief seated recoveries PT Assessment Rehab Potential (ACUTE/IP ONLY): Good PT Barriers to Discharge: Insurance for SNF coverage;Other (comments) PT Barriers to Discharge Comments: Sternal precautions, L hip pain PT Patient demonstrates impairments in the following area(s): Pain;Balance;Motor;Endurance;Edema;Skin Integrity PT Transfers Functional Problem(s): Bed Mobility;Bed to Chair;Car PT Locomotion Functional Problem(s): Ambulation;Stairs PT Plan PT Intensity: Minimum of 1-2 x/day ,45 to 90 minutes PT Frequency: 5 out of 7 days PT Duration Estimated Length of Stay: 7-10 days PT Treatment/Interventions: Ambulation/gait training;Balance/vestibular training;Community reintegration;Discharge planning;Disease management/prevention;DME/adaptive equipment instruction;Cognitive remediation/compensation;Functional mobility training;Pain management;Psychosocial support;Splinting/orthotics;Therapeutic Activities;UE/LE Strength taining/ROM;Visual/perceptual remediation/compensation;Wheelchair propulsion/positioning;UE/LE Coordination activities;Therapeutic Exercise;Skin care/wound management;Stair training;Patient/family education;Neuromuscular re-education PT Transfers Anticipated Outcome(s): mod I with LRAD PT Locomotion Anticipated Outcome(s): supervision with LRAD PT Recommendation Follow Up Recommendations: Outpatient PT;24 hour supervision/assistance Patient destination: Home Equipment Recommended: To be determined   PT Evaluation Precautions/Restrictions Precautions Precautions: Sternal;Fall Precaution Comments: reviewed "move in the tube" and no hard pushing/pulling with UEs, Restrictions Weight Bearing Restrictions:  (sternal precautions) RUE Weight Bearing: Weight bearing as tolerated LUE Weight Bearing: Weight bearing as tolerated Pain Pain  Assessment Pain Scale: 0-10 Pain Score: 4  Home Living/Prior Functioning Home Living Living Arrangements: Spouse/significant other Available Help at Discharge: Family;Available 24 hours/day Type of Home: House Home Access: Stairs to enter Entrance  Stairs-Number of Steps: 3 Entrance Stairs-Rails: Left Home Layout: Two level;Able to live on main level with bedroom/bathroom;Full bath on main level Bathroom Shower/Tub: Multimedia programmer: Handicapped height Bathroom Accessibility: Yes  Lives With: Spouse Prior Function Level of Independence: Independent with transfers;Independent with gait  Able to Take Stairs?: Yes Driving: Yes 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 Leisure: Hobbies-yes (Comment) Comments: Enjoys hiking in the woods Vision/Perception  Perception Perception: Within Functional Limits Praxis Praxis: Intact  Cognition Overall Cognitive Status: Within Functional Limits for tasks assessed Arousal/Alertness: Awake/alert Orientation Level: Oriented X4 Attention: Sustained;Selective Focused Attention: Appears intact Sustained Attention: Appears intact Selective Attention: Appears intact Memory: Appears intact Problem Solving: Appears intact Safety/Judgment: Appears intact Sensation Sensation Light Touch: Appears Intact Hot/Cold: Appears Intact Proprioception: Appears Intact Stereognosis: Appears Intact Coordination Gross Motor Movements are Fluid and Coordinated: Yes Fine Motor Movements are Fluid and Coordinated: Yes Motor  Motor Motor: Within Functional Limits Motor - Skilled Clinical Observations: General deconditioning s/p MVP and aortic repair. Pain limiting with L hip (muscle cramping)   Trunk/Postural Assessment  Cervical Assessment Cervical Assessment: Exceptions to [facility] (forward head) Thoracic Assessment Thoracic Assessment: Exceptions to [facility] (Rounded shoulders) Lumbar Assessment Lumbar Assessment: Exceptions to [facility]  (posterior pelvic tilt) Postural Control Postural Control: Within Functional Limits  Balance Balance Balance Assessed: Yes Static Sitting Balance Static Sitting - Balance Support: Feet supported Static Sitting - Level of Assistance: 5: Stand by assistance Dynamic Sitting Balance Dynamic Sitting - Balance Support: Feet supported;During functional activity Dynamic Sitting - Level of Assistance:  (CGA) Static Standing Balance Static Standing - Balance Support: During functional activity Static Standing - Level of Assistance: 4: Min assist Dynamic Standing Balance Dynamic Standing - Balance Support: During functional activity Dynamic Standing - Level of Assistance: 4: Min assist Extremity Assessment  RUE Assessment RUE Assessment: Within Functional Limits LUE Assessment LUE Assessment: Within Functional Limits RLE Assessment RLE Assessment: Exceptions to [facility] General Strength Comments: Grossly 4/5 LLE Assessment LLE Assessment: Exceptions to [facility] General Strength Comments: Grossly 4/5  Care Tool Care Tool Bed Mobility Roll left and right activity   Roll left and right assist level: Minimal Assistance - Patient > 75%    Sit to lying activity   Sit to lying assist level: Minimal Assistance - Patient > 75%    Lying to sitting edge of bed activity   Lying to sitting edge of bed assist level: Minimal Assistance - Patient > 75%     Care Tool Transfers Sit to stand transfer   Sit to stand assist level: Minimal Assistance - Patient > 75%    Chair/bed transfer   Chair/bed transfer assist level: Minimal Assistance - Patient > 75%     Physiological scientist transfer assist level: Minimal Assistance - Patient > 75%      Care Tool Locomotion Ambulation   Assist level: Minimal Assistance - Patient > 75% Assistive device: Walker-rolling Max distance: 116f  Walk 10 feet activity   Assist level: Minimal Assistance - Patient > 75% Assistive device:  Walker-rolling   Walk 50 feet with 2 turns activity   Assist level: Minimal Assistance - Patient > 75% Assistive device: Walker-rolling  Walk 150 feet activity Walk 150 feet activity did not occur: Safety/medical concerns      Walk 10 feet on uneven surfaces activity Walk 10 feet on uneven surfaces activity did not occur: Safety/medical concerns      Stairs   Assist  level: Minimal Assistance - Patient > 75% Stairs assistive device: 2 hand rails Max number of stairs: 4  Walk up/down 1 step activity   Walk up/down 1 step (curb) assist level: Minimal Assistance - Patient > 75% Walk up/down 1 step or curb assistive device: 2 hand rails    Walk up/down 4 steps activity Walk up/down 4 steps assist level: Minimal Assistance - Patient > 75% Walk up/down 4 steps assistive device: 2 hand rails  Walk up/down 12 steps activity Walk up/down 12 steps activity did not occur: Safety/medical concerns      Pick up small objects from floor Pick up small object from the floor (from standing position) activity did not occur: Safety/medical concerns      Wheelchair Will patient use wheelchair at discharge?: No          Wheel 50 feet with 2 turns activity      Wheel 150 feet activity        Refer to Care Plan for Long Term Goals  SHORT TERM GOAL WEEK 1 PT Short Term Goal 1 (Week 1): STG = LTG due to ELOS  Recommendations for other services: None   Skilled Therapeutic Intervention Mobility Bed Mobility Bed Mobility: Rolling Right;Rolling Left;Right Sidelying to Sit Rolling Right: Minimal Assistance - Patient > 75% Rolling Left: Minimal Assistance - Patient > 75% Right Sidelying to Sit: Minimal Assistance - Patient > 75% Transfers Transfers: Sit to Stand;Stand to Sit;Stand Pivot Transfers Sit to Stand: Minimal Assistance - Patient > 75% Stand to Sit: Minimal Assistance - Patient > 75% Stand Pivot Transfers: Minimal Assistance - Patient > 75% Stand Pivot Transfer Details: Verbal cues  for precautions/safety;Verbal cues for safe use of DME/AE;Visual cues/gestures for precautions/safety;Verbal cues for technique;Visual cues for safe use of DME/AE Transfer (Assistive device): Rolling walker Locomotion  Gait Ambulation: Yes Gait Assistance: Minimal Assistance - Patient > 75% Gait Distance (Feet): 100 Feet Assistive device: Rolling walker Gait Assistance Details: Verbal cues for precautions/safety;Verbal cues for safe use of DME/AE;Visual cues/gestures for precautions/safety;Visual cues/gestures for sequencing;Visual cues for safe use of DME/AE;Verbal cues for technique;Verbal cues for gait pattern Gait Gait: Yes Gait Pattern: Impaired Gait Pattern: Step-to pattern;Decreased step length - left;Decreased step length - right;Antalgic Gait velocity: decreased Stairs / Additional Locomotion Stairs Assistance: Minimal Assistance - Patient > 75% Stair Management Technique: Two rails Number of Stairs: 4 Height of Stairs: 6 Wheelchair Mobility Wheelchair Mobility: No   Skilled Intervention:  1st session: Pt greeted supine in bed, awake and agreeable to PT session. Reports L hip pain, unrated, described as muscle cramping. MD made aware and rest breaks and mobility provided for pain management. Initiated functional mobility as outlined above. Educated on sternal precautions and "move-in-the-tube" precautions. He required minA for bed mobility via log rolling technique, minA for sit<>stands to RW, ambulated ~70f within his room with RW and minA. Demo's antalgic gait with step-to pattern and decreased L weight shift. Pt requesting to toilet, therefore, he was continent of bladder while sitting on the toilet. He required maxA for lower body dressing in standing with minA guard for balance.   Instructed pt in results of PT evaluation as detailed above, PT POC, rehab potential, rehab goals, and discharge recommendations. Additionally discussed CIR's policies regarding fall safety and use  of chair alarm and/or quick release belt. Pt verbalized understanding and in agreement. Will update pt's family members as they become available.   2nd session: Pt received seated in recliner, wife at bedside, pt agreeable to PT  session. He reports mild L hip pain. Mobility and there-ex provided for pain management. Performed stand<>pivot transfer with minA and no AD from recliner to w/c. Retrieved w/c cushion for comfort and pressure relief for w/c use. He ambulated ~171f with CGA and RW within hallways with cues for increasing step length, erect posture, and reducing downward gaze. Continues to demo antalgic gait due to L hip pain. Performed 5xSTS from lowered mat table, scoring 29 seconds. He also performed TUG without an AD, scoring 36 seconds. Scores indicative of increased falls risk. He then negotiated up/down x4 steps with minA and B HR support, cues for stepping pattern and leading with R foot during ascent and L foot during descent. He c/o mild shortness of breath after completing these and spO2 reading 97% and HR 77.Seated rest break provided for recovery. Performed car transfer with minA and RW, car height set to standard sedan. Cues for safety and technique during this, requiring minA for controlled lowering. Pt was then returned to his room in his w/c, performed stand<>pivot with CGA and no AD from w/c to EOB. Able to perform sit>supine with CGA via reverse log roll technique. PT then performed manual therapy for L hip pain, including L hip stretches focusing on piriformis and internal/external rotators. Educated him on these techniques that he can do in bed to assist with relieving muscle tightness. He ended session supine in bed with bed alarm on and needs in reach, pt pleased with today's sessions.  Discharge Criteria: Patient will be discharged from PT if patient refuses treatment 3 consecutive times without medical reason, if treatment goals not met, if there is a change in medical status,  if patient makes no progress towards goals or if patient is discharged from hospital.  The above assessment, treatment plan, treatment alternatives and goals were discussed and mutually agreed upon: by patient  CAlger SimonsPT, DPT 04/01/2020, 12:44 PM

## 2020-04-01 NOTE — Discharge Instructions (Addendum)
Inpatient Rehab Discharge Instructions  Patrick Bell Discharge date and time: No discharge date for patient encounter.   Activities/Precautions/ Functional Status: Activity: Sternal precautions Diet: Regular diet 1200 mL fluid restriction Wound Care: Routine skin checks Functional status:  ___ No restrictions     ___ Walk up steps independently ___ 24/7 supervision/assistance   ___ Walk up steps with assistance ___ Intermittent supervision/assistance  ___ Bathe/dress independently ___ Walk with walker     _x__ Bathe/dress with assistance ___ Walk Independently    ___ Shower independently ___ Walk with assistance    ___ Shower with assistance ___ No alcohol     ___ Return to work/school ________  Special Instructions: No driving smoking or alcohol  Home health nurse to check INR 04/10/2020    results to [facility]/[place] [facility] [phone] fax [phone]   COMMUNITY REFERRALS UPON DISCHARGE:    Home Health:   PT & RN                Agency: [facility]   [phone]   Medical Equipment/Items Ordered:ROLLING Dan Humphreys                                                               [facility] [id number]                                                   My questions have been answered and I understand these instructions. I will adhere to these goals and the provided educational materials after my discharge from the hospital.  Patient/Caregiver Signature _______________________________ Date __________  Clinician Signature _______________________________________ Date __________  Please bring this form and your medication list with you to all your follow-up doctor's appointments.    Information on my medicine - Coumadin   (Warfarin)  This medication education was reviewed with me or my healthcare representative as part of my discharge preparation.  The pharmacist that spoke with me during my hospital stay was:  Severiano Gilbert, [facility]  Why was  Coumadin prescribed for you? Coumadin was prescribed for you because you have a blood clot or a medical condition that can cause an increased risk of forming blood clots. Blood clots can cause serious health problems by blocking the flow of blood to the heart, lung, or brain. Coumadin can prevent harmful blood clots from forming. As a reminder your indication for Coumadin is:   Stroke Prevention Because Of Atrial Fibrillation and heart valve repair.  What test will check on my response to Coumadin? While on Coumadin (warfarin) you will need to have an INR test regularly to ensure that your dose is keeping you in the desired range. The INR (international normalized ratio) number is calculated from the result of the laboratory test called prothrombin time (PT).  If an INR APPOINTMENT HAS NOT ALREADY BEEN MADE FOR YOU please schedule an appointment to have this lab work done by your health care provider within 7 days. Your INR goal is usually a number between:  2 to 3 or your provider may give you a more narrow range like 2-2.5.  Ask your health care  provider during an office visit what your goal INR is.  What  do you need to  know  About  COUMADIN? Take Coumadin (warfarin) exactly as prescribed by your healthcare provider about the same time each day.  DO NOT stop taking without talking to the doctor who prescribed the medication.  Stopping without other blood clot prevention medication to take the place of Coumadin may increase your risk of developing a new clot or stroke.  Get refills before you run out.  What do you do if you miss a dose? If you miss a dose, take it as soon as you remember on the same day then continue your regularly scheduled regimen the next day.  Do not take two doses of Coumadin at the same time.  Important Safety Information A possible side effect of Coumadin (Warfarin) is an increased risk of bleeding. You should call your healthcare provider right away if you experience any  of the following: ? Bleeding from an injury or your nose that does not stop. ? Unusual colored urine (red or dark [place]) or unusual colored stools (red or black). ? Unusual bruising for unknown reasons. ? A serious fall or if you hit your head (even if there is no bleeding).  Some foods or medicines interact with Coumadin (warfarin) and might alter your response to warfarin. To help avoid this: ? Eat a balanced diet, maintaining a consistent amount of Vitamin K. ? Notify your provider about major diet changes you plan to make. ? Avoid alcohol or limit your intake to 1 drink for women and 2 drinks for men per day. (1 drink is 5 oz. wine, 12 oz. beer, or 1.5 oz. liquor.)  Make sure that ANY health care provider who prescribes medication for you knows that you are taking Coumadin (warfarin).  Also make sure the healthcare provider who is monitoring your Coumadin knows when you have started a new medication including herbals and non-prescription products.  Coumadin (Warfarin)  Major Drug Interactions  Increased Warfarin Effect Decreased Warfarin Effect  Alcohol (large quantities) Antibiotics (esp. Septra/Bactrim, Flagyl, Cipro) Amiodarone (Cordarone) Aspirin (ASA) Cimetidine (Tagamet) Megestrol (Megace) NSAIDs (ibuprofen, naproxen, etc.) Piroxicam (Feldene) Propafenone (Rythmol SR) Propranolol (Inderal) Isoniazid (INH) Posaconazole (Noxafil) Barbiturates (Phenobarbital) Carbamazepine (Tegretol) Chlordiazepoxide (Librium) Cholestyramine (Questran) Griseofulvin Oral Contraceptives Rifampin Sucralfate (Carafate) Vitamin K   Coumadin (Warfarin) Major Herbal Interactions  Increased Warfarin Effect Decreased Warfarin Effect  Garlic Ginseng Ginkgo biloba Coenzyme Q10 Green tea St. John's wort    Coumadin (Warfarin) FOOD Interactions  Eat a consistent number of servings per week of foods HIGH in Vitamin K (1 serving =  cup)  Collards (cooked, or boiled & drained) Kale  (cooked, or boiled & drained) Mustard greens (cooked, or boiled & drained) Parsley *serving size only =  cup Spinach (cooked, or boiled & drained) Swiss chard (cooked, or boiled & drained) Turnip greens (cooked, or boiled & drained)  Eat a consistent number of servings per week of foods MEDIUM-HIGH in Vitamin K (1 serving = 1 cup)  Asparagus (cooked, or boiled & drained) Broccoli (cooked, boiled & drained, or raw & chopped) Brussel sprouts (cooked, or boiled & drained) *serving size only =  cup Lettuce, raw (green leaf, endive, romaine) Spinach, raw Turnip greens, raw & chopped   These websites have more information on Coumadin (warfarin):  FailFactory.se; VeganReport.com.au;

## 2020-04-01 NOTE — Progress Notes (Signed)
Inpatient Rehabilitation  Patient information reviewed and entered into eRehab system by Mechel Haggard M. China Deitrick, M.A., CCC/SLP, PPS Coordinator.  Information including medical coding, functional ability and quality indicators will be reviewed and updated through discharge.    

## 2020-04-01 NOTE — Progress Notes (Signed)
ANTICOAGULATION CONSULT NOTE  Pharmacy Consult for Warfarin Indication: atrial fibrillation  No Known Allergies  Patient Measurements: Weight: 82.2 kg Height: 70 inches  Vital Signs: Temp: 98.1 F (36.7 C) (12/29 0300) Temp Source: Oral (12/29 0300) BP: 133/63 (12/29 0300) Pulse Rate: 73 (12/29 0300)  Labs: Recent Labs    03/30/20 0315 03/31/20 0618 04/01/20 0502  HGB 8.7* 8.1* 8.3*  HCT 26.1* 24.2* 26.8*  PLT 380 360 376  LABPROT 16.1* 18.8* 18.8*  INR 1.4* 1.6* 1.6*  CREATININE 1.43* 1.41* 1.47*    Estimated Creatinine Clearance: 41.4 mL/min (A) (by C-G formula based on SCr of 1.47 mg/dL (H)).   Medical History: Past Medical History:  Diagnosis Date  . Acute thoracic aortic dissection (HCC) 03/11/2020   Type A  . Allergy   . Asthma   . Cataract   . Cataract   . Diverticulitis   . Dyspnea   . GERD (gastroesophageal reflux disease)   . Heart murmur 12/2019  . Hyperlipidemia   . Hypertension   . Inguinal hernia bilateral, non-recurrent   . S/P aortic dissection repair 03/11/2020   supracoronary straight graft repair with resuspension of native aortic valve and open distal anastomosis for intraoperative acute type A aortic dissection   . S/P mitral valve repair 03/11/2020   Complex valvuloplasty including artificial Gore-tex neochord placement x 4, suture plication of posterior leaflet and posteromedial commissure and 32 mm Sorin Memo 4D ring annuloplasty  . Sleep apnea    Assessment: 79 yr old man transferred from [facility] to [facility] ([facility]) S/P MVR, repair of iatrogenic acute type A aortic dissection, subxyphoid pericardial fluid drainage with chest tube insertion. Pharmacy is consulted to dose warfarin for atrial fibrillation (warfarin was managed by TCTS during inpatient admission).  H/H 8.3/26.8, platelets  wnl (CBC relatively stable); INR unchanged at 1.6 today (slowly trending up); pt was started on amiodarone previous acute  admission; also on simvastatin and levothyroxine.   Goal of Therapy:  INR 2.0-3.0 Monitor platelets by anticoagulation protocol: Yes   Plan:  Warfarin 4 mg PO X 1 today Monitor daily INR, CBC Monitor for signs/symptoms of bleeding  Sheppard Coil PharmD., BCPS Clinical Pharmacist 04/01/2020 9:33 AM

## 2020-04-01 NOTE — Progress Notes (Signed)
Inpatient Rehabilitation Center Individual Statement of Services  Patient Name:  Patrick Bell  Date:  04/01/2020  Welcome to the Inpatient Rehabilitation Center.  Our goal is to provide you with an individualized program based on your diagnosis and situation, designed to meet your specific needs.  With this comprehensive rehabilitation program, you will be expected to participate in at least 3 hours of rehabilitation therapies Monday-Friday, with modified therapy programming on the weekends.  Your rehabilitation program will include the following services:  Physical Therapy (PT), Occupational Therapy (OT), 24 hour per day rehabilitation nursing, Care Coordinator, Rehabilitation Medicine, Nutrition Services and Pharmacy Services  Weekly team conferences will be held on wednesday to discuss your progress.  Your Inpatient Rehabilitation Care Coordinator will talk with you frequently to get your input and to update you on team discussions.  Team conferences with you and your family in attendance may also be held.  Expected length of stay: 5-7 days  Overall anticipated outcome: supervision with cueing  Depending on your progress and recovery, your program may change. Your Inpatient Rehabilitation Care Coordinator will coordinate services and will keep you informed of any changes. Your Inpatient Rehabilitation Care Coordinator's name and contact numbers are listed  below.  The following services may also be recommended but are not provided by the Inpatient Rehabilitation Center:   Driving Evaluations  Home Health Rehabiltiation Services  Outpatient Rehabilitation Services    Arrangements will be made to provide these services after discharge if needed.  Arrangements include referral to agencies that provide these services.  Your insurance has been verified to be:  [employer]-Medicare Your primary doctor is:  Geologist, engineering  Pertinent information will be shared with your doctor and your  insurance company.  Inpatient Rehabilitation Care Coordinator:  Dossie Der, [place] [phone] or Luna Glasgow  Information discussed with and copy given to patient by: Lucy Chris, 04/01/2020, 9:31 AM

## 2020-04-01 NOTE — Progress Notes (Addendum)
Progress Note  Patient Name: Patrick Bell Date of Encounter: 04/01/2020  [facility] Cardiologist: Shelva Majestic, MD   Subjective   Working with PT this morning. Mostly complains of being tired, but motivated to get better.   Inpatient Medications    Scheduled Meds: . (feeding supplement) PROSource Plus  30 mL Oral BID BM  . amiodarone  200 mg Oral Daily  . Chlorhexidine Gluconate Cloth  6 each Topical Daily  . docusate sodium  200 mg Oral Daily  . feeding supplement  237 mL Oral TID  . ferrous [id number] C-folic acid  1 capsule Oral BID PC  . furosemide  40 mg Oral Daily  . levothyroxine  25 mcg Oral Q0600  . mouth rinse  15 mL Mouth Rinse BID  . metoprolol succinate  25 mg Oral Daily  . montelukast  10 mg Oral QHS  . multivitamin with minerals  1 tablet Oral Daily  . pantoprazole  40 mg Oral QHS  . potassium chloride  20 mEq Oral Daily  . Ensure Max Protein  11 oz Oral TID WC  . simvastatin  10 mg Oral QHS  . Warfarin - Pharmacist Dosing Inpatient   Does not apply q1600   Continuous Infusions:  PRN Meds: acetaminophen, albuterol, alum & mag hydroxide-simeth, benzonatate, bisacodyl, diphenhydrAMINE, guaiFENesin-dextromethorphan, methocarbamol, polyethylene glycol, prochlorperazine **OR** prochlorperazine **OR** prochlorperazine, sodium phosphate, traMADol, traZODone   Vital Signs    Vitals:   03/31/20 1500 03/31/20 1942 04/01/20 0300 04/01/20 0500  BP: (!) 141/65 (!) 136/59 133/63   Pulse: 73 75 73   Resp: 16 16 16    Temp: 98.9 F (37.2 C) 98.7 F (37.1 C) 98.1 F (36.7 C)   TempSrc: Oral  Oral   SpO2: 95% 98% 95%   Weight: 82.8 kg   81.7 kg  Height: 5\' 10"  (1.778 m)       Intake/Output Summary (Last 24 hours) at 04/01/2020 1059 Last data filed at 04/01/2020 0900 Gross per 24 hour  Intake 220 ml  Output 500 ml  Net -280 ml   Last 3 Weights 04/01/2020 03/31/2020 03/31/2020  Weight (lbs) 180 lb 1.9 oz 182 lb 8.7 oz 181 lb 3.2 oz  Weight  (kg) 81.7 kg 82.8 kg 82.192 kg      Telemetry    Not monitored on telemetry  ECG    No new tracing.  Physical Exam  Pleasant older male GEN: No acute distress.   Neck: No JVD Cardiac: RRR, no murmurs, rubs, or gallops.  Respiratory: Clear to auscultation bilaterally. GI: Soft, nontender, non-distended  MS: R>L 1+ LE edema bilaterally; No deformity. Neuro:  Nonfocal  Psych: Normal affect   Labs    High Sensitivity Troponin:  No results for input(s): TROPONINIHS in the last 720 hours.    Chemistry Recent Labs  Lab 03/30/20 0315 03/31/20 0618 04/01/20 0502  NA 136 134* 137  K 3.9 3.9 4.1  CL 101 101 104  CO2 25 25 25   GLUCOSE 99 96 97  BUN 19 18 20   CREATININE 1.43* 1.41* 1.47*  CALCIUM 8.2* 8.1* 8.4*  PROT  --   --  5.3*  ALBUMIN  --   --  2.0*  AST  --   --  18  ALT  --   --  21  ALKPHOS  --   --  63  BILITOT  --   --  0.5  GFRNONAA 50* 50* 48*  ANIONGAP 10 8 8      Hematology  Recent Labs  Lab 03/30/20 0315 03/31/20 0618 04/01/20 0502  WBC 7.7 7.2 6.1  RBC 2.98* 2.75* 3.01*  HGB 8.7* 8.1* 8.3*  HCT 26.1* 24.2* 26.8*  MCV 87.6 88.0 89.0  MCH 29.2 29.5 27.6  MCHC 33.3 33.5 31.0  RDW 14.7 14.6 14.6  PLT 380 360 376    BNPNo results for input(s): BNP, PROBNP in the last 168 hours.   DDimer No results for input(s): DDIMER in the last 168 hours.   Radiology    No results found.  Cardiac Studies   Limited echo 03/21/20 1. There is a large pericardial effusion present that present posterior  to the LV and surrounding the apex. It measures 2.2-2.6 cm posterior to  the LV and ~1.6 cm in the apical views. There is ~25% inspiratory  variation in the MV inflow. Due to  bandages, the IVC was not visualized. The effusion is new since the last  echocardiogram. Due to hypotension, there are concerns for tamponade.  Clinical correlation recommended. Large pericardial effusion. The  pericardial effusion is posterior to the left  ventricle and  surrounding the apex.  2. Left ventricular ejection fraction, by estimation, is 45 to 50%. The  left ventricle has mildly decreased function. The left ventricle  demonstrates global hypokinesis. Left ventricular diastolic function could  not be evaluated.  3. Right ventricular systolic function is normal. The right ventricular  size is normal.  4. MV repair with 32 mm sorin annuloplasty ring. MG 3.1 mmHG @ 98 bpm.  The mitral valve has been repaired/replaced. No evidence of mitral valve  regurgitation. There is a 32 mm prosthetic annuloplasty ring present in  the mitral position. Procedure Date:  03/11/2020.  5. The aortic valve is tricuspid. Aortic valve regurgitation is mild. No  aortic stenosis is present.  6. Ascending aorta repair. Measures 3.0 cm. Aortic root/ascending aorta  has been repaired/replaced.   Comparison(s): Changes from prior study are noted.   Limited echo 03/28/20 Post window IMPRESSIONS  1. Limited echo for pericardial effusion. A trivial effusion is now  present. NSR is now present.  2. Left ventricular ejection fraction, by estimation, is 60 to 65%. The  left ventricle has normal function.  3. Right ventricular systolic function is normal. The right ventricular  size is normal.  4. The mitral valve has been repaired/replaced. No evidence of mitral  valve regurgitation. The mean mitral valve gradient is 5.0 mmHg with  average heart rate of 76 bpm. There is a 32 mm prosthetic annuloplasty  ring present in the mitral position.  Procedure Date: 03/11/2020.  5. The aortic valve is grossly normal.  6. The inferior vena cava is dilated in size with >50% respiratory  variability, suggesting right atrial pressure of 8 mmHg.    Patient Profile     79 y.o. male hypertension, hyperlipidemia, asthma,OSA (followed by Dr. Tresa Endo), normal coronary arteries andsevere mitral regurgitation. He was admitted for plannedMVR on 03/11/2020 withComplex  valvuloplasty including artificial Gore-tex neochord placement x4, suture plication of posterior leaflet and posteromedial commissure,Sorin Memo 4D Ring Annuloplasty (size 81mm, catalog #[id number], serial [id number]), complicated by acute type A aortic dissection upon initiation of cardiopulmonary bypass. He underwent intra-op resuspension of native aortic valveand supracoronary straight graft repair of ascending aorta(Hemashield Platinum size 26 and 28 mm).Cardiology was consulted on 03/21/2020 for atrial fibrillationat which time he was found to have a large pericardial effusion. He underwent pericardial window and evacuation on 03/22/2020. Limited echo 12/25 with trivial effusion.  Assessment &  Plan    1. Severe MR s/p MVR complicated by acute Type A dissection s/p repair: discharged to CIR yesterday. Participating in PT.  -- continue coumadin per PharmD, INR 1.6 today  2. Post op Afib: not monitored on telemetry, but in SR. Felt to be related to pericardial effusion.  -- amiodarone 200mg  daily, Lopressor 25mg  daily -- coumadin per PharmD  3. S/p pericardial effusion: per Dr. Servando Snare. Limited echo 12/25 with trivial effusion  4. HTN: well controlled -- continue Toprol 25mg  daily  5. HLD: on Zocor 10mg  daily -- 11/14/2019: Cholesterol, Total 184; HDL 70; LDL Chol Calc (NIH) 98; Triglycerides 87  6. Bilateral LE edema: improving but still present -- continue lasix 40mg  daily  7. AKI: Cr stable at 1.4  8. Anemia: Hgb stable at 8.3 -- no signs of bleeding  For questions or updates, please contact [place] Please consult [email or web address] for contact info under        Signed, Reino Bellis, NP  04/01/2020, 10:59 AM     Patient seen and examined. Agree with assessment and plan.  Now on rehab unit.  Patient complains of soreness over the right chest wall incision site.  Nonischemic.  Cardiac rhythm remained stable on exam.  Currently not on telemetry.  INR is 1.6, warfarin  adjustment per pharmacy.  Heart rate and blood pressure controlled on metoprolol succinate.  There is mild lower extremity ankle edema right greater than left.  CKD with creatinine 1.47 slightly increased from 1.41 yesterday.   Troy Sine, MD, [facility] 04/01/2020 12:28 PM

## 2020-04-02 ENCOUNTER — Inpatient Hospital Stay (HOSPITAL_COMMUNITY): Payer: Medicare Other

## 2020-04-02 ENCOUNTER — Inpatient Hospital Stay (HOSPITAL_COMMUNITY): Payer: Medicare Other | Admitting: Occupational Therapy

## 2020-04-02 LAB — CBC
HCT: 25.3 % — ABNORMAL LOW (ref 39.0–52.0)
Hemoglobin: 8.3 g/dL — ABNORMAL LOW (ref 13.0–17.0)
MCH: 28.8 pg (ref 26.0–34.0)
MCHC: 32.8 g/dL (ref 30.0–36.0)
MCV: 87.8 fL (ref 80.0–100.0)
Platelets: 353 10*3/uL (ref 150–400)
RBC: 2.88 MIL/uL — ABNORMAL LOW (ref 4.22–5.81)
RDW: 14.6 % (ref 11.5–15.5)
WBC: 5 10*3/uL (ref 4.0–10.5)
nRBC: 0 % (ref 0.0–0.2)

## 2020-04-02 LAB — PROTIME-INR
INR: 1.8 — ABNORMAL HIGH (ref 0.8–1.2)
Prothrombin Time: 20.2 seconds — ABNORMAL HIGH (ref 11.4–15.2)

## 2020-04-02 MED ORDER — WARFARIN SODIUM 2 MG PO TABS
4.0000 mg | ORAL_TABLET | Freq: Once | ORAL | Status: AC
  Administered 2020-04-02: 17:00:00 4 mg via ORAL
  Filled 2020-04-02: qty 2

## 2020-04-02 MED ORDER — METHOCARBAMOL 500 MG PO TABS
500.0000 mg | ORAL_TABLET | Freq: Four times a day (QID) | ORAL | Status: DC
  Administered 2020-04-02 – 2020-04-05 (×12): 500 mg via ORAL
  Filled 2020-04-02 (×12): qty 1

## 2020-04-02 NOTE — Progress Notes (Signed)
[place] PHYSICAL MEDICINE & REHABILITATION PROGRESS NOTE  Subjective/Complaints: Patient seen working with therapy this morning.  He states he slept well overnight.  Discussed left gluteal strain with patient and therapies.  He notes his right pectoral area is sore as well.  He has not tried muscle relaxers.  He was seen by CTS yesterday, notes reviewed - no changes  ROS: +shortness of breath (baseline), noncardiac chest pain, right pectoral soreness, left gluteal soreness.  Denies nausea, vomiting, diarrhea.    Objective: Vital Signs: Blood pressure (!) 129/54, pulse 74, temperature 98.4 F (36.9 C), temperature source Oral, resp. rate 18, height 5\' 10"  (1.778 m), weight 81.2 kg, SpO2 95 %. No results found. Recent Labs    04/01/20 0502 04/02/20 0505  WBC 6.1 5.0  HGB 8.3* 8.3*  HCT 26.8* 25.3*  PLT 376 353   Recent Labs    03/31/20 0618 04/01/20 0502  NA 134* 137  K 3.9 4.1  CL 101 104  CO2 25 25  GLUCOSE 96 97  BUN 18 20  CREATININE 1.41* 1.47*  CALCIUM 8.1* 8.4*    Intake/Output Summary (Last 24 hours) at 04/02/2020 0920 Last data filed at 04/02/2020 0700 Gross per 24 hour  Intake 505 ml  Output 950 ml  Net -445 ml        Physical Exam: BP (!) 129/54 (BP Location: Right Arm)    Pulse 74    Temp 98.4 F (36.9 C) (Oral)    Resp 18    Ht 5\' 10"  (1.778 m)    Wt 81.2 kg    SpO2 95%    BMI 25.69 kg/m  Constitutional: No distress . Vital signs reviewed. HENT: Normocephalic.  Atraumatic. Eyes: EOMI. No discharge. Cardiovascular: No JVD.  RRR. Respiratory: Normal effort.  No stridor.  Bilateral clear to auscultation. GI: Non-distended.  BS +. Skin: Warm and dry.  Incision with sutures CDI Psych: Normal mood.  Normal behavior. Musc: TTP right pec, left gluteal Left lower extremity > right lower extremity peripheral edema, improving Neuro: Alert Motor: Bilateral upper extremities: 4 -/5 proximal distal, stable Bilateral lower extremities: 4/5 proximal  distal  Assessment/Plan: 1. Functional deficits which require 3+ hours per day of interdisciplinary therapy in a comprehensive inpatient rehab setting.  Physiatrist is providing close team supervision and 24 hour management of active medical problems listed below.  Physiatrist and rehab team continue to assess barriers to discharge/monitor patient progress toward functional and medical goals   Care Tool:  Bathing    Body parts bathed by patient: Right arm,Left arm,Front perineal area,Buttocks,Right upper leg,Left upper leg,Face     Body parts n/a: Left lower leg,Right lower leg,Abdomen,Chest   Bathing assist Assist Level: Minimal Assistance - Patient > 75%     Upper Body Dressing/Undressing Upper body dressing   What is the patient wearing?: Pull over shirt    Upper body assist Assist Level: Minimal Assistance - Patient > 75%    Lower Body Dressing/Undressing Lower body dressing      What is the patient wearing?: Pants,Incontinence brief     Lower body assist Assist for lower body dressing: Moderate Assistance - Patient 50 - 74%     Toileting Toileting    Toileting assist Assist for toileting: Moderate Assistance - Patient 50 - 74% Assistive Device Comment: urinal   Transfers Chair/bed transfer  Transfers assist     Chair/bed transfer assist level: Contact Guard/Touching assist     Locomotion Ambulation   Ambulation assist  Assist level: Minimal Assistance - Patient > 75% Assistive device: Walker-rolling Max distance: 139ft   Walk 10 feet activity   Assist     Assist level: Minimal Assistance - Patient > 75% Assistive device: Walker-rolling   Walk 50 feet activity   Assist    Assist level: Minimal Assistance - Patient > 75% Assistive device: Walker-rolling    Walk 150 feet activity   Assist Walk 150 feet activity did not occur: Safety/medical concerns         Walk 10 feet on uneven surface  activity   Assist Walk 10  feet on uneven surfaces activity did not occur: Safety/medical concerns         Wheelchair     Assist Will patient use wheelchair at discharge?: No             Wheelchair 50 feet with 2 turns activity    Assist            Wheelchair 150 feet activity     Assist           Medical Problem List and Plan: 1.  Impaired mobility secondary to mitral valve repair on 03/11/2020 with pericardial effusion on 03/22/2020, further complicated by atrial fibrillation.   Continue CIR  DC sutures early next week 2.  Antithrombotics: -DVT/anticoagulation:  Pharmaceutical: Coumadin  INR subtherapeutic on 12/30             -antiplatelet therapy: N/a 3. Pain Management:   Tramadol PRN.   Robaxin as needed started on 12/29, scheduled on 12/30 4. Mood: LCSW to follow for evaluation and support.              -antipsychotic agents: N/a 5. Neuropsych: This patient is capable of making decisions on his own behalf. 6. Skin/Wound Care: Monitor wound for healing. Routine pressure relief measures.  7. Fluids/Electrolytes/Nutrition: Monitor I/Os.    Added multivitamin  8.  Fluid overload: Monitor weights daily and for other signs of overload  Lasix decreased to once/day on 12/27.   Low salt diet, added TEDs and elevation to help manage BLE edema.   On fluid restriciton Filed Weights   03/31/20 1500 04/01/20 0500 04/02/20 0353  Weight: 82.8 kg 81.7 kg 81.2 kg   Stable on 12/30 9.  New onset A fib/PAF: Continue amiodarone, toprol XL. 10. Acute blood loss anemia:   Increased iron to bid   Hemoglobin 8.3 on 12/30  Continue to monitor 11. Hypothyroid: TSH- 6.5--new diagnosis   Synthroid started on 12/21  TSH to be rechecked in 4 weeks.   12. Acute kidney injury:  Scr improved from peak of 2.02  ~1.4 (?  New baseline)   Creatinine 1.47 on 12/29, labs ordered for tomorrow  Continue to monitor 13.  Anorexia: He has been refusing schedule supplements but intake improving. 14.  OSA: Has not been using machine due to intolerance but plans on trialing it again after discharge.  15. Overweight: BMI 26- provide education.  16.  Severe hypoalbuminemia  Supplement initiated on 12/29  LOS: 2 days A FACE TO FACE EVALUATION WAS PERFORMED  Margrete Delude Karis Juba 04/02/2020, 9:20 AM

## 2020-04-02 NOTE — Progress Notes (Signed)
Occupational Therapy Session Note  Patient Details  Name: Patrick Bell MRN: [id number] Date of Birth: December 08, 1940  Today's Date: 04/02/2020 OT Individual Time: 1300-1410 OT Individual Time Calculation (min): 70 min    Short Term Goals: Week 1:  OT Short Term Goal 1 (Week 1): STGs=LTGs due to ELOS  Skilled Therapeutic Interventions/Progress Updates:    Pt supine in bed, no c/o pain, requesting to use bathroom at beginning of OT session.  Pt completed supine to sit with min assist.  Pt ambulated to bathroom using RW with supervision for improved follow through of sternal precautions.  Pt had continent episode of bowel.  Toileting completed with supervision for education on compensatory techniques to complete while abiding by sternal precautions.  Pt ambulated to sink and washed hands in standing with supervision for safe placement of RW.  Pt completed UB/LB dressing and bathing with supervision needing frequent VCs to keep BUE close to body and education on use of reacher to assist with LB dressing . Pt also educated on use of long handled sponge to wash back while following precautions and pt simulated due to reports wanting to wait to get in shower to use.  Pt donned socks with supervision, again, needing frequent VCs to follow sternal precautions.  Pt ambulated to EOB with supervision using RW.  Stand to sit and sit to supine with supervision for VCs for body mechanics.  Call bell in reach, bed alarm on.   Therapy Documentation Precautions:  Precautions Precautions: Sternal,Fall Precaution Comments: reviewed "move in the tube" and no hard pushing/pulling with UEs, Restrictions Weight Bearing Restrictions: Yes RUE Weight Bearing:  (sternal precautions) LUE Weight Bearing:  (sternal precautions) Other Position/Activity Restrictions: Sternal precautions   Therapy/Group: Individual Therapy  Amie Critchley 04/02/2020, 4:13 PM

## 2020-04-02 NOTE — Progress Notes (Signed)
ANTICOAGULATION CONSULT NOTE  Pharmacy Consult for Warfarin Indication: atrial fibrillation  No Known Allergies  Patient Measurements: Weight: 82.2 kg Height: 70 inches  Vital Signs: Temp: 98.4 F (36.9 C) (12/30 0353) Temp Source: Oral (12/30 0353) BP: 129/54 (12/30 0353) Pulse Rate: 74 (12/30 0353)  Labs: Recent Labs    03/31/20 0618 04/01/20 0502 04/02/20 0505  HGB 8.1* 8.3* 8.3*  HCT 24.2* 26.8* 25.3*  PLT 360 376 353  LABPROT 18.8* 18.8* 20.2*  INR 1.6* 1.6* 1.8*  CREATININE 1.41* 1.47*  --     Estimated Creatinine Clearance: 41.4 mL/min (A) (by C-G formula based on SCr of 1.47 mg/dL (H)).   Medical History: Past Medical History:  Diagnosis Date  . Acute thoracic aortic dissection (HCC) 03/11/2020   Type A  . Allergy   . Asthma   . Cataract   . Cataract   . Diverticulitis   . Dyspnea   . GERD (gastroesophageal reflux disease)   . Heart murmur 12/2019  . Hyperlipidemia   . Hypertension   . Inguinal hernia bilateral, non-recurrent   . S/P aortic dissection repair 03/11/2020   supracoronary straight graft repair with resuspension of native aortic valve and open distal anastomosis for intraoperative acute type A aortic dissection   . S/P mitral valve repair 03/11/2020   Complex valvuloplasty including artificial Gore-tex neochord placement x 4, suture plication of posterior leaflet and posteromedial commissure and 32 mm Sorin Memo 4D ring annuloplasty  . Sleep apnea    Assessment: 79 yr old man transferred from [facility] to [facility] ([facility] S/P MVR, repair of iatrogenic acute type A aortic dissection, subxyphoid pericardial fluid drainage with chest tube insertion. Pharmacy is consulted to dose warfarin for atrial fibrillation (warfarin was managed by TCTS during inpatient admission).  H/H 8.3/25, platelets wnl (CBC relatively stable); INR slowly trending up to 1.8 today; pt was started on amiodarone previous acute admission; also on  simvastatin and levothyroxine.   Goal of Therapy:  INR 2.0-3.0 Monitor platelets by anticoagulation protocol: Yes   Plan:  Repeat Warfarin 4 mg again today,will increase dosing in am if no response Monitor daily INR Monitor for signs/symptoms of bleeding  Sheppard Coil PharmD., BCPS Clinical Pharmacist 04/02/2020 8:14 AM

## 2020-04-02 NOTE — Progress Notes (Signed)
Physical Therapy Session Note  Patient Details  Name: Patrick Bell MRN: [id number] Date of Birth: December 22, 1940  Today's Date: 04/02/2020 PT Individual Time: 0800-0857 + 1100-1200 PT Individual Time Calculation (min): 57 min  + 60 min  Short Term Goals: Week 1:  PT Short Term Goal 1 (Week 1): STG = LTG due to ELOS  Skilled Therapeutic Interventions/Progress Updates:     1st session: Pt received supine in bed, agreeable to PT session. RN present for morning medications and pt reports no L hip pain at rest, denies pain medication. He performed supine<>sit with supervision with HOB elevated via log rolling technique. Sit>stand with supervision to RW and he ambulated ~61ft with RW and CGA to bathroom. He required minA for lowering pants/briefs and able to perform stand>sit with CGA to lowered toilet height. Pt was continent of bladder. Able to rise from toilet with CGA to RW and required minA for managing lower body dressing. He then ambulated ~32ft back to w/c in room with CGA and RW. W/c transport for time management to main therapy gym. Performed gait training, ambulating ~255ft with CGA and RW. Cues throughout for reducing rounded shoulders and forward flexed trunk with emphasis on erect posture and light reliance of BUE's through RW. Educated on benefits of postural awareness in order to reduce healing in tightened pectoral position. Performed standing there-ex while facing mirror for visual feedback and // bar for BUE support and therapist providing CGA for stability: -1x10 BLE hip abduction -1x10 BLE hip flexion -1x10 BLE hamstrig currls *cues for correct dosing, muscle activation, and sequencing throughout.  Ambulated back to his room after a brief seated rest break with CGA and RW, stand>sit with supervision to EOB and sit>supine with supervision with HOB elevated via reverse log roll technique. Spent the remaining time focusing on L hip stretches while supine in bed, including  piriformis/TFL/internal rotators. Also applied manual pressure to tight muscle bands for trigger release. Pt reports subjective improvement in pain and thankful for stretches. Encouraged him to do these outside of therapy to improve carryover into functional mobility. He remained supine in bed at end of session with needs in reach and bed alarm on.  2nd session: Pt received supine in bed, wife at bedside, pt agreeable to PT session. He denies any pain but does report L hip stiffness. Mobility provided for relief and management. Performed supine<>sit with supervision with bed features. Stand<>pivot with supervision from EOB to w/c with no AD and cues for safety and sequencing. W/c transport for time management from his room to ortho gym. Stand<>pivot with CGA from w/c to Nustep where he performed x10 minutes at workload of 5 with BLE's only, focusing on cardiovascular endurance and BLE strengthening. He reports moderate fatigue after completion and requires a brief seated rest break for recovery. Then ambulated >216ft with close supervision and RW on level surfaces as well as navigating up/down 45ft ramp with supervision and RW. Cues throughout for improving postural awareness and reducing reliance of BUE's through RW. He also performed furniture transfers on soft cushion recliner with supervision and RW. Pt then reporting need to void, ambulating to ADL apartment bathroom with RW and supervision and able to manage lower body dressing with supervision and stand>sit to lowered toilet height with supervision, continent of bladder. Pt then ambulated >362ft with supervision and RW on level surfaces with similar cues provided as above, focusing on cardiorespiratory endurance and normalizing gait pattern. He returned to his room where he remained seated in chair  with chair alarm on and wife at bedside, needs in reach. Pt pleased with progress.  Therapy Documentation Precautions:  Precautions Precautions:  Sternal,Fall Precaution Comments: reviewed "move in the tube" and no hard pushing/pulling with UEs, Restrictions Weight Bearing Restrictions: No RUE Weight Bearing: Weight bearing as tolerated LUE Weight Bearing: Weight bearing as tolerated Other Position/Activity Restrictions: Sternal precautions  Therapy/Group: Individual Therapy  Rikita Grabert P Alaisa Moffitt PT 04/02/2020, 7:42 AM

## 2020-04-03 ENCOUNTER — Inpatient Hospital Stay (HOSPITAL_COMMUNITY): Payer: Medicare Other

## 2020-04-03 ENCOUNTER — Inpatient Hospital Stay (HOSPITAL_COMMUNITY): Payer: Medicare Other | Admitting: Occupational Therapy

## 2020-04-03 LAB — BASIC METABOLIC PANEL
Anion gap: 10 (ref 5–15)
BUN: 23 mg/dL (ref 8–23)
CO2: 24 mmol/L (ref 22–32)
Calcium: 8.4 mg/dL — ABNORMAL LOW (ref 8.9–10.3)
Chloride: 101 mmol/L (ref 98–111)
Creatinine, Ser: 1.55 mg/dL — ABNORMAL HIGH (ref 0.61–1.24)
GFR, Estimated: 45 mL/min — ABNORMAL LOW (ref >60)
Glucose, Bld: 92 mg/dL (ref 70–99)
Potassium: 4.5 mmol/L (ref 3.5–5.1)
Sodium: 135 mmol/L (ref 135–145)

## 2020-04-03 LAB — CBC
HCT: 27.3 % — ABNORMAL LOW (ref 39.0–52.0)
Hemoglobin: 8.4 g/dL — ABNORMAL LOW (ref 13.0–17.0)
MCH: 27.2 pg (ref 26.0–34.0)
MCHC: 30.8 g/dL (ref 30.0–36.0)
MCV: 88.3 fL (ref 80.0–100.0)
Platelets: 362 10*3/uL (ref 150–400)
RBC: 3.09 MIL/uL — ABNORMAL LOW (ref 4.22–5.81)
RDW: 14.6 % (ref 11.5–15.5)
WBC: 4.9 10*3/uL (ref 4.0–10.5)
nRBC: 0 % (ref 0.0–0.2)

## 2020-04-03 LAB — PROTIME-INR
INR: 1.9 — ABNORMAL HIGH (ref 0.8–1.2)
Prothrombin Time: 21.2 seconds — ABNORMAL HIGH (ref 11.4–15.2)

## 2020-04-03 MED ORDER — WARFARIN SODIUM 5 MG PO TABS
5.0000 mg | ORAL_TABLET | Freq: Once | ORAL | Status: AC
  Administered 2020-04-03: 5 mg via ORAL
  Filled 2020-04-03: qty 1

## 2020-04-03 NOTE — Progress Notes (Signed)
Physical Therapy Session Note  Patient Details  Name: Patrick Bell MRN: [id number] Date of Birth: April 06, 1940  Today's Date: 04/03/2020 PT Individual Time: 0802-0827 PT Individual Time Calculation (min): 25 min   Short Term Goals: Week 1:  PT Short Term Goal 1 (Week 1): STG = LTG due to ELOS  Skilled Therapeutic Interventions/Progress Updates:   Received pt supine in bed asleep, pt easily aroused and reported feeling sleepy and groggy from taking his sleeping pill too late last night. Pt agreeable to therapy and denied any pain during session. Session with emphasis on functional mobility/transfers, dressing, generalized strengthening, dynamic standing balance/coordination, ambulation, toileting, and improved activity tolerance. Pt transferred supine<>sitting EOB with supervision and donned pants sitting EOB with supervision and increased time. Pt transferred sit<>stand with RW and CGA due to initial posterior LOB and able to pull pants over hips with supervision. Pt ambulated 21ft with RW and supervision to bathroom and able to perform toilet transfer and manage clothing with supervision. Pt continent of bladder and able to perform hygiene management with supervision. Pt ambulated additional 15ft with RW and supervision to sink and stood and washed hands and brushed teeth with close supervision. Pt ambulated 67ft with RW and supervision to recliner. Concluded session with pt semi-reclined in recliner, needs within reach, and chair pad alarm on. Therapist positioned multiple pillows behind pt's back for comfort.   Therapy Documentation Precautions:  Precautions Precautions: Sternal,Fall Precaution Comments: reviewed "move in the tube" and no hard pushing/pulling with UEs, Restrictions Weight Bearing Restrictions: Yes RUE Weight Bearing:  (sternal precaution) LUE Weight Bearing:  (sternal precaution) Other Position/Activity Restrictions: Sternal precautions  Therapy/Group: Individual  Therapy Martin Majestic PT, DPT   04/03/2020, 7:14 AM

## 2020-04-03 NOTE — Progress Notes (Signed)
[place] PHYSICAL MEDICINE & REHABILITATION PROGRESS NOTE  Subjective/Complaints: Patient seen sitting up in bed this morning.  He states he did not sleep well overnight because he took a couple of naps during the day yesterday.  He has questions regarding sutures.  ROS: +shortness of breath (baseline), noncardiac chest pain.  Denies nausea, vomiting, diarrhea.    Objective: Vital Signs: Blood pressure 140/68, pulse 73, temperature 97.9 F (36.6 C), temperature source Oral, resp. rate 16, height 5\' 10"  (1.778 m), weight 78.7 kg, SpO2 98 %. No results found. Recent Labs    04/02/20 0505 04/03/20 0451  WBC 5.0 4.9  HGB 8.3* 8.4*  HCT 25.3* 27.3*  PLT 353 362   Recent Labs    04/01/20 0502 04/03/20 0451  NA 137 135  K 4.1 4.5  CL 104 101  CO2 25 24  GLUCOSE 97 92  BUN 20 23  CREATININE 1.47* 1.55*  CALCIUM 8.4* 8.4*    Intake/Output Summary (Last 24 hours) at 04/03/2020 0902 Last data filed at 04/03/2020 0000 Gross per 24 hour  Intake 300 ml  Output 700 ml  Net -400 ml        Physical Exam: BP 140/68 (BP Location: Left Arm)   Pulse 73   Temp 97.9 F (36.6 C) (Oral)   Resp 16   Ht 5\' 10"  (1.778 m)   Wt 78.7 kg   SpO2 98%   BMI 24.89 kg/m  Constitutional: No distress . Vital signs reviewed. HENT: Normocephalic.  Atraumatic. Eyes: EOMI. No discharge. Cardiovascular: No JVD.  RRR. Respiratory: Normal effort.  No stridor.  Bilateral clear to auscultation. GI: Non-distended.  BS +. Skin: Warm and dry.  Incisions with sutures CDI Psych: Normal mood.  Normal behavior. Musc: No edema in extremities.   Left gluteal tenderness and right pectoral tenderness improving Peripheral edema improving Neuro: Alert Motor: Bilateral upper extremities: 4 -/5 proximal distal, unchanged Bilateral lower extremities: 4/5 proximal distal  Assessment/Plan: 1. Functional deficits which require 3+ hours per day of interdisciplinary therapy in a comprehensive inpatient rehab  setting.  Physiatrist is providing close team supervision and 24 hour management of active medical problems listed below.  Physiatrist and rehab team continue to assess barriers to discharge/monitor patient progress toward functional and medical goals   Care Tool:  Bathing    Body parts bathed by patient: Right arm,Left arm,Front perineal area,Buttocks,Right upper leg,Left upper leg,Face     Body parts n/a: Left lower leg,Right lower leg,Abdomen,Chest   Bathing assist Assist Level: Minimal Assistance - Patient > 75%     Upper Body Dressing/Undressing Upper body dressing   What is the patient wearing?: Pull over shirt    Upper body assist Assist Level: Minimal Assistance - Patient > 75%    Lower Body Dressing/Undressing Lower body dressing      What is the patient wearing?: Pants,Incontinence brief     Lower body assist Assist for lower body dressing: Moderate Assistance - Patient 50 - 74%     Toileting Toileting    Toileting assist Assist for toileting: Minimal Assistance - Patient > 75% Assistive Device Comment: front wheel walker   Transfers Chair/bed transfer  Transfers assist     Chair/bed transfer assist level: Supervision/Verbal cueing     Locomotion Ambulation   Ambulation assist      Assist level: Supervision/Verbal cueing Assistive device: Walker-rolling Max distance: 235ft   Walk 10 feet activity   Assist     Assist level: Supervision/Verbal cueing Assistive device: Walker-rolling  Walk 50 feet activity   Assist    Assist level: Supervision/Verbal cueing Assistive device: Walker-rolling    Walk 150 feet activity   Assist Walk 150 feet activity did not occur: Safety/medical concerns  Assist level: Supervision/Verbal cueing Assistive device: Walker-rolling    Walk 10 feet on uneven surface  activity   Assist Walk 10 feet on uneven surfaces activity did not occur: Safety/medical concerns   Assist level:  Supervision/Verbal cueing Assistive device: Photographer Will patient use wheelchair at discharge?: No             Wheelchair 50 feet with 2 turns activity    Assist            Wheelchair 150 feet activity     Assist           Medical Problem List and Plan: 1.  Impaired mobility secondary to mitral valve repair on 03/11/2020 with pericardial effusion on 03/22/2020, further complicated by atrial fibrillation.   Continue CIR  DC sutures early next week 2.  Antithrombotics: -DVT/anticoagulation:  Pharmaceutical: Coumadin (goal 2.0-3.0)  INR subtherapeutic on 12/31             -antiplatelet therapy: N/a 3. Pain Management:   Tramadol PRN.   Robaxin as needed started on 12/29, scheduled on 12/30  Improving 4. Mood: LCSW to follow for evaluation and support.              -antipsychotic agents: N/a 5. Neuropsych: This patient is capable of making decisions on his own behalf. 6. Skin/Wound Care: Monitor wound for healing. Routine pressure relief measures.  7. Fluids/Electrolytes/Nutrition: Monitor I/Os.    Added multivitamin  8.  Fluid overload: Monitor weights daily and for other signs of overload  Lasix decreased to once/day on 12/27.   Low salt diet, added TEDs and elevation to help manage BLE edema.   DC fluid restriciton Filed Weights   04/01/20 0500 04/02/20 0353 04/03/20 0335  Weight: 81.7 kg 81.2 kg 78.7 kg   ?  Reliability on 12/31 9.  New onset A fib/PAF: Continue amiodarone, toprol XL. 10. Acute blood loss anemia:   Increased iron to bid   Hemoglobin 8.4 on 12/31  Continue to monitor 11. Hypothyroid: TSH- 6.5--new diagnosis   Synthroid started on 12/21  TSH to be rechecked in 4 weeks.   12. Acute kidney injury:  Scr improved from peak of 2.02  ~1.4 (?  New baseline)   Creatinine 1.55 on 12/31  See #8  Continue to monitor 13. OSA: Has not been using machine due to intolerance but plans on trialing it again after  discharge.  14. Overweight: BMI 26- provide education.  15.  Severe hypoalbuminemia  Supplement initiated on 12/29  LOS: 3 days A FACE TO FACE EVALUATION WAS PERFORMED  Merrel Crabbe Karis Juba 04/03/2020, 9:02 AM

## 2020-04-03 NOTE — Progress Notes (Signed)
Physical Therapy Session Note  Patient Details  Name: Patrick Bell MRN: [id number] Date of Birth: [birth year]  Today's Date: 04/03/2020 PT Individual Time: 1000-1056 + 1300-1400 + 1500-1525 PT Individual Time Calculation (min): 56 min  + 60 min + 25 min  Short Term Goals: Week 1:  PT Short Term Goal 1 (Week 1): STG = LTG due to ELOS  Skilled Therapeutic Interventions/Progress Updates:     1st session: Pt greeted sleeping supine in bed, awakens easily to voice and agreeable to PT session. No reports of pain but endorses general grogginess from poor nights rest and also from taking sleeping pills that he normally doesn't take. He performed supine<>sit with supervision via log rolling technique with HOB flat. Upon sitting EOB, he reports need to void. Sit>stand with supervision and RW and ambulated to bathroom with supervision and RW where he was able to manage lower body dressing in standing with supervision and stand>sit to toilet with appropriate controlled descent. Pt continent of bladder. W/c transport to main hallway to perform 6MWT. Pt instructed on rules and purpose of 6MWT and pt agreeable. He ambulated a total of 535ft with supervision and RW while on level surfaces with no standing rest break required. Normative data for healthy community dwelling male adults aged 7-80 is 1359ft. Pt continues to demonstrate forward flexed trunk with gait and requires continuous reminders for erect posture with thoracic extension as well as trying to reduce BUE reliance on RW with gait.   Focused remainder of session on hip and LE strengthening. Performed sit>supine with supervision via log rolling technique onto mat table. He completed the following supine there-ex: -2x15 sidelying clamshells bilaterally -2x15 bridges -2x15 LAQ with 3# ankle weight *rest breaks provided b/w sets and cues throughout for correct muscle activation and sequencing  Supine<>sit with supervision via log rolling and  stand<>pivot with supervision and no AD from mat table to w/c. W/c transport for time management back to his room and performed additional stand<>pivot with supervision from w/c to regular chair. Placed trash can under feet for additional support. He remained seated in chair with chair alarm on and wife at bedside, needs in reach.  2nd session: Pt greeted sitting in chair with wife at bedside, pt agreeable to PT session. Pt denies pain. Lengthy discussion held with patient and wife regarding home safety training, DME rec's, and follow up therapy recommendations. Also trained wife on safe transfers with education on guarding technique, use of RW, turning on/off chair alarm, and always making sure that grip socks or shoes or on when transferring. She voiced good understanding and safe demonstration. Pt voiced need to void, therefore, he ambulated with supervision and RW to bathroom where he managed lower body dressing with supervision and sat on toilet with appropriate controlled lowering and continent of bladder. W/c transport to main therapy gym for time management.   Performed stair training where he negotiated up/down x12 steps with supervision and bilateral hand rail support, showing reciprocal stepping pattern without LOB or knee buckling but did show mild posterior bias with cues for forward weight shift. Then performed curb training with 6inch curb and therapist providing demonstration for carryover. He negotiated this curb with RW and CGA without too much difficulty, but did continue to show posterior bias and needed reminders for correcting.   W/c transport to ortho gym to focus on cardiorespiratory endurance. Transferred to Nustep with CGA and no AD and he completed x10 minutes of Nustep at workload of 5 with ~40 step/minute  cadence. He reports more muscular (quad) fatigue > cardio fatigue after completion. Stand<>pivot with CGA back to his w/c and he was returned to his room with totalA in w/c for  time management. Stand step transfer with supervision and no AD from w/c to EOB and sit>supine via reverse log rolling with supervision. He remained supine in bed with needs in reach and bed alarm on.   3rd session: Pt received supine in bed, sleeping but awakens easily to voice. Agreeable to PT session without reports of pain. Pt voiced need to void. Supine<>sit with supervision via log rolling and sit<>stand with supervision to RW. Ambulated to bathroom with supervision and able to manage lower body dressing with supervisoin in standing. Continent of bladder while seated on toilet, charted. Focus of session to perform gait training outdoors on unlevel surfaces. W/c transport outside to water fountain with totalA for time management. Once outside, he ambulated ~270ft with close supervision and RW with cues for increasing step length and correcting forward flexed trunk. Unfortunately, it began to rain outside so we had to abort outdoor gait training. We returned to his room in w/c and per his request, wanted to remain seated in w/c at end of session. Reminded him to use call bell for staff assist when ready to return to bed and he voiced understanding. Chair alarm on and needs in reach at end of session.  Therapy Documentation Precautions:  Precautions Precautions: Sternal,Fall Precaution Comments: reviewed "move in the tube" and no hard pushing/pulling with UEs, Restrictions Weight Bearing Restrictions: Yes RUE Weight Bearing:  (sternal precaution) LUE Weight Bearing:  (sternal precaution) Other Position/Activity Restrictions: Sternal precautions  Therapy/Group: Individual Therapy  Orrin Brigham PT, DPT 04/03/2020, 7:41 AM

## 2020-04-03 NOTE — Progress Notes (Signed)
ANTICOAGULATION CONSULT NOTE  Pharmacy Consult for Warfarin Indication: atrial fibrillation  No Known Allergies  Patient Measurements: Weight: 82.2 kg Height: 70 inches  Vital Signs: Temp: 97.9 F (36.6 C) (12/31 0335) Temp Source: Oral (12/31 0335) BP: 140/68 (12/31 0335) Pulse Rate: 73 (12/31 0335)  Labs: Recent Labs    04/01/20 0502 04/02/20 0505 04/03/20 0451  HGB 8.3* 8.3* 8.4*  HCT 26.8* 25.3* 27.3*  PLT 376 353 362  LABPROT 18.8* 20.2* 21.2*  INR 1.6* 1.8* 1.9*  CREATININE 1.47*  --  1.55*    Estimated Creatinine Clearance: 39.2 mL/min (A) (by C-G formula based on SCr of 1.55 mg/dL (H)).   Medical History: Past Medical History:  Diagnosis Date  . Acute thoracic aortic dissection (HCC) 03/11/2020   Type A  . Allergy   . Asthma   . Cataract   . Cataract   . Diverticulitis   . Dyspnea   . GERD (gastroesophageal reflux disease)   . Heart murmur 12/2019  . Hyperlipidemia   . Hypertension   . Inguinal hernia bilateral, non-recurrent   . S/P aortic dissection repair 03/11/2020   supracoronary straight graft repair with resuspension of native aortic valve and open distal anastomosis for intraoperative acute type A aortic dissection   . S/P mitral valve repair 03/11/2020   Complex valvuloplasty including artificial Gore-tex neochord placement x 4, suture plication of posterior leaflet and posteromedial commissure and 32 mm Sorin Memo 4D ring annuloplasty  . Sleep apnea    Assessment: 79 yr old man transferred from [facility] to [facility] ([facility] S/P MVR, repair of iatrogenic acute type A aortic dissection, subxyphoid pericardial fluid drainage with chest tube insertion. Pharmacy is consulted to dose warfarin for atrial fibrillation (warfarin was managed by TCTS during inpatient admission).  H/H 8.4/22, platelets wnl (CBC relatively stable); INR continues to slowly trending up to 1.9. No new drug interactions noted. Patient continues on low dose  amio.   Goal of Therapy:  INR 2.0-3.0 Monitor platelets by anticoagulation protocol: Yes   Plan:  Warfarin 5mg  tonight Monitor daily INR Monitor for signs/symptoms of bleeding  PharmD., BCPS Clinical Pharmacist 04/03/2020 8:05 AM

## 2020-04-04 ENCOUNTER — Inpatient Hospital Stay (HOSPITAL_COMMUNITY): Payer: Medicare Other

## 2020-04-04 ENCOUNTER — Inpatient Hospital Stay (HOSPITAL_COMMUNITY): Payer: Medicare Other | Admitting: Occupational Therapy

## 2020-04-04 LAB — PROTIME-INR
INR: 2.3 — ABNORMAL HIGH (ref 0.8–1.2)
Prothrombin Time: 24.3 seconds — ABNORMAL HIGH (ref 11.4–15.2)

## 2020-04-04 LAB — CBC
HCT: 28.7 % — ABNORMAL LOW (ref 39.0–52.0)
Hemoglobin: 9.6 g/dL — ABNORMAL LOW (ref 13.0–17.0)
MCH: 28.7 pg (ref 26.0–34.0)
MCHC: 33.4 g/dL (ref 30.0–36.0)
MCV: 85.9 fL (ref 80.0–100.0)
Platelets: 396 10*3/uL (ref 150–400)
RBC: 3.34 MIL/uL — ABNORMAL LOW (ref 4.22–5.81)
RDW: 14.7 % (ref 11.5–15.5)
WBC: 6.3 10*3/uL (ref 4.0–10.5)
nRBC: 0 % (ref 0.0–0.2)

## 2020-04-04 MED ORDER — WARFARIN SODIUM 5 MG PO TABS
5.0000 mg | ORAL_TABLET | Freq: Once | ORAL | Status: AC
  Administered 2020-04-04: 5 mg via ORAL
  Filled 2020-04-04: qty 1

## 2020-04-04 NOTE — Progress Notes (Signed)
Patrick Bell PHYSICAL MEDICINE & REHABILITATION PROGRESS NOTE  Subjective/Complaints: Patient seen laying in bed this morning.  He states he slept well overnight.  He notes resolution of left gluteal soreness, but continues to express right chest soreness.  He has questions regarding sutures again.  ROS: +shortness of breath (baseline), noncardiac right chest wall pain.  Denies nausea, vomiting, diarrhea.    Objective: Vital Signs: Blood pressure (!) 122/51, pulse 68, temperature 97.8 F (36.6 C), temperature source Oral, resp. rate 15, height 5\' 10"  (1.778 m), weight 78.7 kg, SpO2 97 %. No results found. Recent Labs    04/03/20 0451 04/04/20 0651  WBC 4.9 6.3  HGB 8.4* 9.6*  HCT 27.3* 28.7*  PLT 362 396   Recent Labs    04/03/20 0451  NA 135  K 4.5  CL 101  CO2 24  GLUCOSE 92  BUN 23  CREATININE 1.55*  CALCIUM 8.4*    Intake/Output Summary (Last 24 hours) at 04/04/2020 1633 Last data filed at 04/04/2020 1319 Gross per 24 hour  Intake 780 ml  Output -  Net 780 ml        Physical Exam: BP (!) 122/51 (BP Location: Right Arm)   Pulse 68   Temp 97.8 F (36.6 C) (Oral)   Resp 15   Ht 5\' 10"  (1.778 m)   Wt 78.7 kg   SpO2 97%   BMI 24.89 kg/m  Constitutional: No distress . Vital signs reviewed. HENT: Normocephalic.  Atraumatic. Eyes: EOMI. No discharge. Cardiovascular: No JVD.  RRR. Respiratory: Normal effort.  No stridor.  Bilateral clear to auscultation. GI: Non-distended.  BS +. Skin: Warm and dry.  Incision with sutures CDI Psych: Normal mood.  Normal behavior. Musc: No edema in extremities.   Right chest wall soreness Peripheral edema improving Neuro: Alert Motor: Bilateral upper extremities: 4 -/5 proximal distal, stable Bilateral lower extremities: 4/5 proximal distal  Assessment/Plan: 1. Functional deficits which require 3+ hours per day of interdisciplinary therapy in a comprehensive inpatient rehab setting.  Physiatrist is providing close team  supervision and 24 hour management of active medical problems listed below.  Physiatrist and rehab team continue to assess barriers to discharge/monitor patient progress toward functional and medical goals   Care Tool:  Bathing    Body parts bathed by patient: Right arm,Left arm,Front perineal area,Buttocks,Right upper leg,Left upper leg,Face     Body parts n/a: Left lower leg,Right lower leg,Abdomen,Chest   Bathing assist Assist Level: Minimal Assistance - Patient > 75%     Upper Body Dressing/Undressing Upper body dressing   What is the patient wearing?: Pull over shirt    Upper body assist Assist Level: Minimal Assistance - Patient > 75%    Lower Body Dressing/Undressing Lower body dressing      What is the patient wearing?: Pants,Incontinence brief     Lower body assist Assist for lower body dressing: Moderate Assistance - Patient 50 - 74%     Toileting Toileting    Toileting assist Assist for toileting: Minimal Assistance - Patient > 75% Assistive Device Comment: front wheel walker   Transfers Chair/bed transfer  Transfers assist     Chair/bed transfer assist level: Supervision/Verbal cueing     Locomotion Ambulation   Ambulation assist      Assist level: Supervision/Verbal cueing Assistive device: Walker-rolling Max distance: 241ft   Walk 10 feet activity   Assist     Assist level: Supervision/Verbal cueing Assistive device: Walker-rolling   Walk 50 feet activity   Assist  Assist level: Supervision/Verbal cueing Assistive device: Walker-rolling    Walk 150 feet activity   Assist Walk 150 feet activity did not occur: Safety/medical concerns  Assist level: Supervision/Verbal cueing Assistive device: Walker-rolling    Walk 10 feet on uneven surface  activity   Assist Walk 10 feet on uneven surfaces activity did not occur: Safety/medical concerns   Assist level: Supervision/Verbal cueing Assistive device:  Aeronautical engineer Will patient use wheelchair at discharge?: No             Wheelchair 50 feet with 2 turns activity    Assist            Wheelchair 150 feet activity     Assist           Medical Problem List and Plan: 1.  Impaired mobility secondary to mitral valve repair on 03/11/2020 with pericardial effusion on 0000000, further complicated by atrial fibrillation.   Continue CIR  DC sutures on Monday 2.  Antithrombotics: -DVT/anticoagulation:  Pharmaceutical: Coumadin (goal 2.0-3.0)  INR therapeutic on 1/1             -antiplatelet therapy: N/a 3. Pain Management:   Tramadol PRN.   Robaxin as needed started on 12/29, scheduled on 12/30  Improved 4. Mood: LCSW to follow for evaluation and support.              -antipsychotic agents: N/a 5. Neuropsych: This patient is capable of making decisions on his own behalf. 6. Skin/Wound Care: Monitor wound for healing. Routine pressure relief measures.  7. Fluids/Electrolytes/Nutrition: Monitor I/Os.    Added multivitamin  8.  Fluid overload: Monitor weights daily and for other signs of overload  Lasix decreased to once/day on 12/27.   Low salt diet, added TEDs and elevation to help manage BLE edema.   DC fluid restriciton Filed Weights   04/02/20 0353 04/03/20 0335 04/04/20 0351  Weight: 81.2 kg 78.7 kg 78.7 kg   Stable on 1/1 9.  New onset A fib/PAF: Continue amiodarone, toprol XL. 10. Acute blood loss anemia:   Increased iron to bid   Hemoglobin 9.6 on 1/1  Continue to monitor 11. Hypothyroid: TSH- 6.5--new diagnosis   Synthroid started on 12/21  TSH to be rechecked in 4 weeks.   12. Acute kidney injury:  Scr improved from peak of 2.02  ~1.4 (?  New baseline)   Creatinine 1.55 on 12/31, labs ordered for Monday  See #8  Continue to monitor 13. OSA: Has not been using machine due to intolerance but plans on trialing it again after discharge.  14. Overweight: BMI 26- provide  education.  15.  Severe hypoalbuminemia  Supplement initiated on 12/29  LOS: 4 days A FACE TO FACE EVALUATION WAS PERFORMED  Patrick Bell Patrick Bell 04/04/2020, 4:33 PM

## 2020-04-04 NOTE — Progress Notes (Signed)
Occupational Therapy Session Note  Patient Details  Name: Patrick Bell MRN: [id number] Date of Birth: 1940/11/27  Today's Date: 04/04/2020 OT Individual Time: 1000-1100 OT Individual Time Calculation (min): 60 min    Short Term Goals: Week 1:  OT Short Term Goal 1 (Week 1): STGs=LTGs due to ELOS  Skilled Therapeutic Interventions/Progress Updates: Patient asked to complete functional mobility and shower if he promised to keep the water on his bottom and not onto sutures or left upper arm pic line area.  He completed and participated in skilled OT services as follows:  Patient often sighed as and required multiple rest breaks.  After a few rest breaks, he was educated on energy conservation and taking deep breathing and rest breaks as needed.   He was assured that even during endurance and other  therapy training, that is it ok and good for him to be proactive to intitiate rest breaks as needed in order to conserve his energy for several tasks or the day.  He completed supine lying to sit edge of bed with distant S;  Functional mobilty to walk with close S without an assistive device from his bed to toilet for toilet transfer;   Toileting, including cleansing and clothing mgmt on regular room toilet seat=close s and extra time to rest betweens the toileting steps/tasks.  In room shower transfer via grab bars and tub transfer bench transfer= close S and extra time as he guarded his balance due to fatigue.  Bathing = Min A to thoroughly rinse buttocks with the hold hand hose due to patient request due to fatigue.   Patient elected not to wash feet with cloth or sponge today in order to reserve his energy.    This clinician washed his back in order to help prevent water from getting onto sutures and left upper arm bandaged fformer pic line area.  Upper body dressing, he completed with setup.  He donned left arm through sleeve and then head into neck hole and then right arm into sleeeve in  order to stay within sternal precautions. Lower body dressing=setup and he elected not to don socks in order to allow his feet to "get some air after the shower."  Edge of bed to supine in bed transfer= S  Continue OT Plan of care to reach goals and collaborate with patient to increase safety and independence with self care, I ADLs and funtional transfers.     Therapy Documentation Precautions:  Precautions Precautions: Sternal,Fall Precaution Comments: reviewed "move in the tube" and no hard pushing/pulling with UEs, Restrictions Weight Bearing Restrictions: No RUE Weight Bearing:  (sternal precaution) LUE Weight Bearing:  (sternal precaution) Other Position/Activity Restrictions: Sternal precautions  Pain:denied     Therapy/Group: Individual Therapy  Bud Face [facility] 04/04/2020, 1:47 PM

## 2020-04-04 NOTE — Progress Notes (Signed)
ANTICOAGULATION CONSULT NOTE - Follow Up Consult  Pharmacy Consult for warfarin Indication: atrial fibrillation  No Known Allergies  Patient Measurements: Height: 5\' 10"  (177.8 cm) Weight: 78.7 kg (173 lb 8 oz) IBW/kg (Calculated) : 73 kg  Vital Signs: Temp: 98.5 F (36.9 C) (01/01 0351) Temp Source: Oral (01/01 0351) BP: 125/61 (01/01 0351) Pulse Rate: 70 (01/01 0351)  Labs: Recent Labs    04/02/20 0505 04/03/20 0451 04/04/20 0651  HGB 8.3* 8.4* 9.6*  HCT 25.3* 27.3* 28.7*  PLT 353 362 396  LABPROT 20.2* 21.2*  --   INR 1.8* 1.9*  --   CREATININE  --  1.55*  --     Estimated Creatinine Clearance: 39.2 mL/min (A) (by C-G formula based on SCr of 1.55 mg/dL (H)).   Medications:  Medications Prior to Admission  Medication Sig Dispense Refill Last Dose  . acetaminophen (TYLENOL) 500 MG tablet Take 500 mg by mouth daily as needed (pain.).     06/02/20 amiodarone (PACERONE) 200 MG tablet Take 1 tablet (200 mg total) by mouth daily. 30 tablet 1   . ferrous fumarate-b12-vitamic C-folic acid (TRINSICON / FOLTRIN) capsule Take 1 capsule by mouth daily with breakfast.     . furosemide (LASIX) 40 MG tablet Take 1 tablet (40 mg total) by mouth daily for 3 days. 3 tablet    . hydroxypropyl methylcellulose / hypromellose (ISOPTO TEARS / GONIOVISC) 2.5 % ophthalmic solution Place 1-2 drops into both eyes 3 (three) times daily as needed (dry/irritated eyes.).     Marland Kitchen levothyroxine (SYNTHROID) 25 MCG tablet Take 1 tablet (25 mcg total) by mouth daily at 6 (six) AM. 30 tablet 1   . metoprolol succinate (TOPROL-XL) 25 MG 24 hr tablet Take 1 tablet (25 mg total) by mouth daily. 30 tablet 1   . montelukast (SINGULAIR) 10 MG tablet Take 1 tablet (10 mg total) daily by mouth. (Patient taking differently: Take 10 mg by mouth at bedtime. ) 90 tablet 3   . Olopatadine HCl (PATADAY) 0.2 % SOLN Place 1 drop into both eyes daily as needed (allergies).      . pantoprazole (PROTONIX) 40 MG tablet Take 1  tablet (40 mg total) daily by mouth. (Patient taking differently: Take 40 mg by mouth at bedtime. ) 90 tablet 3   . potassium chloride SA (KLOR-CON) 20 MEQ tablet Take 1 tablet (20 mEq total) by mouth 2 (two) times daily for 3 days. 6 tablet 0   . PROAIR HFA 108 (90 Base) MCG/ACT inhaler Inhale 2 puffs into the lungs every 6 (six) hours as needed for wheezing. (Patient taking differently: Inhale 2 puffs into the lungs every 6 (six) hours as needed for wheezing. ) 8.5 g 2   . simvastatin (ZOCOR) 10 MG tablet Take 1 tablet (10 mg total) by mouth at bedtime. 90 tablet 3   . traMADol (ULTRAM) 50 MG tablet Take 1-2 tablets (50-100 mg total) by mouth every 4 (four) hours as needed for moderate pain. 30 tablet    . warfarin (COUMADIN) 3 MG tablet Take 1 tablet (3 mg total) by mouth daily at 4 PM. 30 tablet 1    Scheduled:  . (feeding supplement) PROSource Plus  30 mL Oral BID BM  . amiodarone  200 mg Oral Daily  . Chlorhexidine Gluconate Cloth  6 each Topical Daily  . docusate sodium  200 mg Oral Daily  . ferrous fumarate-b12-vitamic C-folic acid  1 capsule Oral BID PC  . furosemide  40 mg Oral  Daily  . levothyroxine  25 mcg Oral Q0600  . mouth rinse  15 mL Mouth Rinse BID  . methocarbamol  500 mg Oral QID  . metoprolol succinate  25 mg Oral Daily  . montelukast  10 mg Oral QHS  . multivitamin with minerals  1 tablet Oral Daily  . pantoprazole  40 mg Oral QHS  . potassium chloride  20 mEq Oral Daily  . Ensure Max Protein  11 oz Oral BID  . simvastatin  10 mg Oral QHS  . Warfarin - Pharmacist Dosing Inpatient   Does not apply q1600   Infusions:   PRN: acetaminophen, albuterol, alum & mag hydroxide-simeth, benzonatate, bisacodyl, diphenhydrAMINE, guaiFENesin-dextromethorphan, polyethylene glycol, prochlorperazine **OR** prochlorperazine **OR** prochlorperazine, sodium phosphate, traMADol, traZODone Anti-infectives (From admission, onward)   None      Assessment: 80 yo male transferred from  [facility] to [place] ([facility]) s/p MVR, repair of iatrogenic acute type A aortic dissection, and subxyphoid pericardial fluid drainage with chest tube insertion. Pharmacy is consulted to dose warfarin for atrial fibrillation (warfarin was managed by TCTS during inpatient admission).  INR this morning is therapeutic at 2.3. CBC is WNL and relatively stable. There have been no new interacting medications started or stopped, the patient remains on amiodarone and simvastatin. No signs or symptoms of bleeding are noted. The patient has not had any significant diet changes.  Goal of Therapy:  INR 2-3 Monitor platelets by anticoagulation protocol: Yes   Plan:  Give warfarin 5 mg PO tonight Monitor daily PT/INR Monitor for signs and symptoms of bleeding   Shauna Hugh, PharmD, [place]  PGY-1 Pharmacy Resident 04/04/2020 8:18 AM  Please check [email or web address] for unit-specific pharmacy phone numbers.

## 2020-04-04 NOTE — Progress Notes (Signed)
Physical Therapy Session Note  Patient Details  Name: Patrick Bell MRN: [id number] Date of Birth: [birth year]  Today's Date: 04/04/2020 PT Individual Time: [id number]  + [id number] PT Individual Time Calculation (min): 58 min  + 43 min  Short Term Goals: Week 1:  PT Short Term Goal 1 (Week 1): STG = LTG due to ELOS  Skilled Therapeutic Interventions/Progress Updates:     1st session: Pt greeted supine in bed, agreeable to PT session. Reports no pain and well rested night. Supine<>sit with supervision via log rolling with HOB elevated. Sit<>stand with supervision to RW and ambulated to bathroom where he was continent of bladder. W/c transport to ortho gym with [place] for time management. Stand<>pivot with supervision and no AD from w/c to mat table. He completed the BERG balance test, scoring 50/56. Score indicative of decreased falls risk.    Patient demonstrates increased fall risk as noted by score of  50/56 on Berg Balance Scale.  (<36= high risk for falls, close to 100%; 37-45 significant >80%; 46-51 moderate >50%; 52-55 lower >25%)  Performed dynamic standing balance while performing chop/lift patterns with weightless ball as well as overhead reaching with weightless bal focusing on thoracic extension and erect posture. Then performed bouncing the ball, focusing on righting reactions and BUE coordination. After seated rest, performed seated ball toss in multi-plane directions as well as standing ball toss in similar fashion. Move-in-tube precautions maintained throughout and supervision from therapist for safety.   Performed interval training for cardiovascular endurance with BUE ergometer in standing with therapist supervision. x2 minutes at level 2.0 resistance forwards and x2 minutes at level 2.0 resistance backwards with x1 minute interval rest breaks. Completed this x3 bouts.   Pt was returned to his room in his w/c with totalA for energy conservation and performed stand-step pivot  with supervision and no AD from w/c to EOB. Sit>supine with supervision via reverse log rolling with HOB flat. Ended session supine in bed with bed alarm on and needs in reach.  2nd session: Pt received supine in bed with wife at bedside, pt agreeable to PT session. No reports of pain. Focus of session to progress gait training with LRAD and BLE strengthening. Supine<>sit with supervision via log rolling and pt ambulating to bathroom with supervision and RW. Pt continent of bladder while seated on toilet. W/c transport for time management to main hallway. Performed gait training with NO AD, requiring CGA/minA for stability due to forward trunk lean and somewhat festinating gait pattern. Performed side stepping L<>R along blue tape ~43feet each direction with CGA. Also performed tandem gait on blue tape ~2x42ft with minA and no AD. Additional gait training with no AD focusing on increasing BUE arm swing as he tends to keep rigid with arms in pockets. Performed the following standing there-ex in // bars for BLE strengthening: -2x10 squats with BUE support -2x10 hip abduction with 4# ankle weight -2x20 standing heel raises -seated LAQ with 4# ankle weight for x1 minute -seated hip marches with 4# ankle weight for x1 minute  Pt returned to his room in his w/c and stand<>pivot with CGA and no AD to EOB. Sit>supine with supervision and remained supine in bed with needs in reach and bed alarm on at end of session.  Therapy Documentation Precautions:  Precautions Precautions: Sternal,Fall Precaution Comments: reviewed "move in the tube" and no hard pushing/pulling with UEs, Restrictions Weight Bearing Restrictions: No RUE Weight Bearing:  (sternal precaution) LUE Weight Bearing:  (sternal precaution) Other Position/Activity  Restrictions: Sternal precautions   Therapy/Group: Individual Therapy  Estephan Gallardo P Elenna Spratling PT 04/04/2020, 7:47 AM

## 2020-04-05 ENCOUNTER — Encounter (HOSPITAL_COMMUNITY): Payer: Self-pay | Admitting: Physical Medicine & Rehabilitation

## 2020-04-05 DIAGNOSIS — R030 Elevated blood-pressure reading, without diagnosis of hypertension: Secondary | ICD-10-CM

## 2020-04-05 DIAGNOSIS — E876 Hypokalemia: Secondary | ICD-10-CM

## 2020-04-05 DIAGNOSIS — R609 Edema, unspecified: Secondary | ICD-10-CM

## 2020-04-05 LAB — CBC
HCT: 28.8 % — ABNORMAL LOW (ref 39.0–52.0)
Hemoglobin: 8.9 g/dL — ABNORMAL LOW (ref 13.0–17.0)
MCH: 27.5 pg (ref 26.0–34.0)
MCHC: 30.9 g/dL (ref 30.0–36.0)
MCV: 88.9 fL (ref 80.0–100.0)
Platelets: 333 10*3/uL (ref 150–400)
RBC: 3.24 MIL/uL — ABNORMAL LOW (ref 4.22–5.81)
RDW: 14.8 % (ref 11.5–15.5)
WBC: 5.5 10*3/uL (ref 4.0–10.5)
nRBC: 0 % (ref 0.0–0.2)

## 2020-04-05 LAB — PROTIME-INR
INR: 2.2 — ABNORMAL HIGH (ref 0.8–1.2)
Prothrombin Time: 23.9 seconds — ABNORMAL HIGH (ref 11.4–15.2)

## 2020-04-05 MED ORDER — FUROSEMIDE 20 MG PO TABS
20.0000 mg | ORAL_TABLET | Freq: Every day | ORAL | Status: DC
  Administered 2020-04-06: 20 mg via ORAL
  Filled 2020-04-05: qty 1

## 2020-04-05 MED ORDER — WARFARIN SODIUM 5 MG PO TABS
5.0000 mg | ORAL_TABLET | Freq: Once | ORAL | Status: AC
  Administered 2020-04-05: 5 mg via ORAL
  Filled 2020-04-05: qty 1

## 2020-04-05 MED ORDER — METHOCARBAMOL 500 MG PO TABS: 500.0000 mg | ORAL_TABLET | Freq: Three times a day (TID) | ORAL | Status: DC | PRN

## 2020-04-05 NOTE — Progress Notes (Signed)
I was asked to look at wounds with suture (2 on the right and 1 just left of center are silk sutures and there are 3 Nylon sutures as well). All wounds are well healed, no sign of infection. Eschars present on some. All sutures to be removed today.

## 2020-04-05 NOTE — Progress Notes (Signed)
ANTICOAGULATION CONSULT NOTE - Follow Up Consult  Pharmacy Consult for warfarin Indication: atrial fibrillation  No Known Allergies  Patient Measurements: Height: 5\' 10"  (177.8 cm) Weight: 79.3 kg (174 lb 13.2 oz) IBW/kg (Calculated) : 73 kg  Vital Signs: Temp: 97.5 F (36.4 C) (01/02 0457) BP: 146/78 (01/02 0457) Pulse Rate: 74 (01/02 0457)  Labs: Recent Labs    04/03/20 0451 04/04/20 0651 04/04/20 0900 04/05/20 0516  HGB 8.4* 9.6*  --  8.9*  HCT 27.3* 28.7*  --  28.8*  PLT 362 396  --  333  LABPROT 21.2*  --  24.3* 23.9*  INR 1.9*  --  2.3* 2.2*  CREATININE 1.55*  --   --   --     Estimated Creatinine Clearance: 39.2 mL/min (A) (by C-G formula based on SCr of 1.55 mg/dL (H)).   Medications:  Medications Prior to Admission  Medication Sig Dispense Refill Last Dose  . acetaminophen (TYLENOL) 500 MG tablet Take 500 mg by mouth daily as needed (pain.).     06/03/20 amiodarone (PACERONE) 200 MG tablet Take 1 tablet (200 mg total) by mouth daily. 30 tablet 1   . ferrous fumarate-b12-vitamic C-folic acid (TRINSICON / FOLTRIN) capsule Take 1 capsule by mouth daily with breakfast.     . furosemide (LASIX) 40 MG tablet Take 1 tablet (40 mg total) by mouth daily for 3 days. 3 tablet    . hydroxypropyl methylcellulose / hypromellose (ISOPTO TEARS / GONIOVISC) 2.5 % ophthalmic solution Place 1-2 drops into both eyes 3 (three) times daily as needed (dry/irritated eyes.).     Marland Kitchen levothyroxine (SYNTHROID) 25 MCG tablet Take 1 tablet (25 mcg total) by mouth daily at 6 (six) AM. 30 tablet 1   . metoprolol succinate (TOPROL-XL) 25 MG 24 hr tablet Take 1 tablet (25 mg total) by mouth daily. 30 tablet 1   . montelukast (SINGULAIR) 10 MG tablet Take 1 tablet (10 mg total) daily by mouth. (Patient taking differently: Take 10 mg by mouth at bedtime. ) 90 tablet 3   . Olopatadine HCl (PATADAY) 0.2 % SOLN Place 1 drop into both eyes daily as needed (allergies).      . pantoprazole (PROTONIX) 40 MG  tablet Take 1 tablet (40 mg total) daily by mouth. (Patient taking differently: Take 40 mg by mouth at bedtime. ) 90 tablet 3   . potassium chloride SA (KLOR-CON) 20 MEQ tablet Take 1 tablet (20 mEq total) by mouth 2 (two) times daily for 3 days. 6 tablet 0   . PROAIR HFA 108 (90 Base) MCG/ACT inhaler Inhale 2 puffs into the lungs every 6 (six) hours as needed for wheezing. (Patient taking differently: Inhale 2 puffs into the lungs every 6 (six) hours as needed for wheezing. ) 8.5 g 2   . simvastatin (ZOCOR) 10 MG tablet Take 1 tablet (10 mg total) by mouth at bedtime. 90 tablet 3   . traMADol (ULTRAM) 50 MG tablet Take 1-2 tablets (50-100 mg total) by mouth every 4 (four) hours as needed for moderate pain. 30 tablet    . warfarin (COUMADIN) 3 MG tablet Take 1 tablet (3 mg total) by mouth daily at 4 PM. 30 tablet 1    Scheduled:  . (feeding supplement) PROSource Plus  30 mL Oral BID BM  . amiodarone  200 mg Oral Daily  . docusate sodium  200 mg Oral Daily  . ferrous fumarate-b12-vitamic C-folic acid  1 capsule Oral BID PC  . furosemide  40 mg  Oral Daily  . levothyroxine  25 mcg Oral Q0600  . mouth rinse  15 mL Mouth Rinse BID  . metoprolol succinate  25 mg Oral Daily  . montelukast  10 mg Oral QHS  . multivitamin with minerals  1 tablet Oral Daily  . pantoprazole  40 mg Oral QHS  . potassium chloride  20 mEq Oral Daily  . Ensure Max Protein  11 oz Oral BID  . simvastatin  10 mg Oral QHS  . Warfarin - Pharmacist Dosing Inpatient   Does not apply q1600   Infusions:   PRN: acetaminophen, albuterol, alum & mag hydroxide-simeth, benzonatate, bisacodyl, diphenhydrAMINE, guaiFENesin-dextromethorphan, methocarbamol, polyethylene glycol, prochlorperazine **OR** prochlorperazine **OR** prochlorperazine, sodium phosphate, traMADol, traZODone Anti-infectives (From admission, onward)   None      Assessment: 80 yo male transferred from [facility] to [facility] ([facility]) s/p MVR,  repair of iatrogenic acute type A aortic dissection, and subxyphoid pericardial fluid drainage with chest tube insertion. Pharmacy is consulted to dose warfarin for atrial fibrillation (warfarin was managed by TCTS during inpatient admission).  INR this morning is therapeutic at 2.2. CBC is relatively stable. There have been no new interacting medications started or stopped, the patient remains on amiodarone and simvastatin. No signs or symptoms of bleeding are noted. The patient has not had any significant diet changes.  Goal of Therapy:  INR 2-3 Monitor platelets by anticoagulation protocol: Yes   Plan:  Give warfarin 5 mg PO tonight Monitor daily PT/INR Monitor for drug interactions and diet changes Monitor for signs and symptoms of bleeding   Shauna Hugh, PharmD, [place]  PGY-1 Pharmacy Resident 04/05/2020 8:55 AM  Please check [email or web address] for unit-specific pharmacy phone numbers.

## 2020-04-05 NOTE — Progress Notes (Signed)
Sutures removed patient tolerated.

## 2020-04-05 NOTE — Progress Notes (Signed)
[place] PHYSICAL MEDICINE & REHABILITATION PROGRESS NOTE  Subjective/Complaints: Patient seen laying in bed this morning.  He states he slept very well overnight.  He states he was seen by CTS PA this a.m., notes reviewed-plan to remove sutures.  Patient is nervous, reassured again.  Patient has questions regarding Lasix, edema, potassium  ROS: +shortness of breath (baseline), noncardiac right chest wall pain.  Denies nausea, vomiting, diarrhea.    Objective: Vital Signs: Blood pressure (!) 146/78, pulse 74, temperature (!) 97.5 F (36.4 C), resp. rate 16, height 5\' 10"  (1.778 m), weight 79.3 kg, SpO2 97 %. No results found. Recent Labs    04/04/20 0651 04/05/20 0516  WBC 6.3 5.5  HGB 9.6* 8.9*  HCT 28.7* 28.8*  PLT 396 333   Recent Labs    04/03/20 0451  NA 135  K 4.5  CL 101  CO2 24  GLUCOSE 92  BUN 23  CREATININE 1.55*  CALCIUM 8.4*    Intake/Output Summary (Last 24 hours) at 04/05/2020 0850 Last data filed at 04/05/2020 0732 Gross per 24 hour  Intake 800 ml  Output -  Net 800 ml        Physical Exam: BP (!) 146/78 (BP Location: Left Arm)   Pulse 74   Temp (!) 97.5 F (36.4 C)   Resp 16   Ht 5\' 10"  (1.778 m)   Wt 79.3 kg   SpO2 97%   BMI 25.08 kg/m  Constitutional: No distress . Vital signs reviewed. HENT: Normocephalic.  Atraumatic. Eyes: EOMI. No discharge. Cardiovascular: No JVD.  RRR. Respiratory: Normal effort.  No stridor.  Bilateral clear to auscultation. GI: Non-distended.  BS +. Skin: Warm and dry.  Incisions with sutures CDI Psych: Normal mood.  Normal behavior. Musc: No edema in extremities.   Right chest wall soreness  Neuro: Alert Motor: Bilateral upper extremities: 4/5 proximal distal Bilateral lower extremities: 4/5 proximal distal  Assessment/Plan: 1. Functional deficits which require 3+ hours per day of interdisciplinary therapy in a comprehensive inpatient rehab setting.  Physiatrist is providing close team supervision and  24 hour management of active medical problems listed below.  Physiatrist and rehab team continue to assess barriers to discharge/monitor patient progress toward functional and medical goals   Care Tool:  Bathing    Body parts bathed by patient: Right arm,Left arm,Front perineal area,Buttocks,Right upper leg,Left upper leg,Face     Body parts n/a: Left lower leg,Right lower leg,Abdomen,Chest   Bathing assist Assist Level: Minimal Assistance - Patient > 75%     Upper Body Dressing/Undressing Upper body dressing   What is the patient wearing?: Pull over shirt    Upper body assist Assist Level: Minimal Assistance - Patient > 75%    Lower Body Dressing/Undressing Lower body dressing      What is the patient wearing?: Pants,Incontinence brief     Lower body assist Assist for lower body dressing: Moderate Assistance - Patient 50 - 74%     Toileting Toileting    Toileting assist Assist for toileting: Minimal Assistance - Patient > 75% Assistive Device Comment: front wheel walker   Transfers Chair/bed transfer  Transfers assist     Chair/bed transfer assist level: Supervision/Verbal cueing     Locomotion Ambulation   Ambulation assist      Assist level: Supervision/Verbal cueing Assistive device: Walker-rolling Max distance: 266ft   Walk 10 feet activity   Assist     Assist level: Supervision/Verbal cueing Assistive device: Walker-rolling   Walk 50 feet activity  Assist    Assist level: Supervision/Verbal cueing Assistive device: Walker-rolling    Walk 150 feet activity   Assist Walk 150 feet activity did not occur: Safety/medical concerns  Assist level: Supervision/Verbal cueing Assistive device: Walker-rolling    Walk 10 feet on uneven surface  activity   Assist Walk 10 feet on uneven surfaces activity did not occur: Safety/medical concerns   Assist level: Supervision/Verbal cueing Assistive device: [employer] manager Will patient use wheelchair at discharge?: No             Wheelchair 50 feet with 2 turns activity    Assist            Wheelchair 150 feet activity     Assist           Medical Problem List and Plan: 1.  Impaired mobility secondary to mitral valve repair on 03/11/2020 with pericardial effusion on 03/22/2020, further complicated by atrial fibrillation.   Continue CIR  DC sutures 2.  Antithrombotics: -DVT/anticoagulation:  Pharmaceutical: Coumadin (goal 2.0-3.0)  INR therapeutic on 1/2             -antiplatelet therapy: N/a 3. Pain Management:   Tramadol PRN.   Robaxin as needed started on 12/29, scheduled on 12/30, changed to as needed on 1/2  Improved 4. Mood: LCSW to follow for evaluation and support.              -antipsychotic agents: N/a 5. Neuropsych: This patient is capable of making decisions on his own behalf. 6. Skin/Wound Care: Monitor wound for healing. Routine pressure relief measures.  7. Fluids/Electrolytes/Nutrition: Monitor I/Os.    Added multivitamin  8.  Fluid overload: Monitor weights daily and for other signs of overload  Lasix decreased to once/day on 12/27, decreased to 20 on 1/3.   Low salt diet, added TEDs and elevation to help manage BLE edema.   DC fluid restriciton Filed Weights   04/03/20 0335 04/04/20 0351 04/05/20 0457  Weight: 78.7 kg 78.7 kg 79.3 kg   Relatively stable on 1/2 9.  New onset A fib/PAF: Continue amiodarone, toprol XL. 10. Acute blood loss anemia:   Increased iron to bid   Hemoglobin 8.9 on 1/2  Continue to monitor 11. Hypothyroid: TSH- 6.5--new diagnosis   Synthroid started on 12/21  TSH to be rechecked in 4 weeks.   12. Acute kidney injury:  Scr improved from peak of 2.02  ~1.4 (?  New baseline)   Creatinine 1.55 on 12/31, labs ordered for tomorrow  See #8  Continue to monitor 13. OSA: Has not been using machine due to intolerance but plans on trialing it again after  discharge.  14. Overweight: BMI 26- provide education.  15.  Severe hypoalbuminemia  Supplement initiated on 12/29 16.  Elevated blood pressure  Elevated and?  Trending up on 1/2, will consider medications if persistent 17.  Hypokalemia-secondary to Lasix  Potassium 4.5 on 12/31, labs ordered for tomorrow  LOS: 5 days A FACE TO FACE EVALUATION WAS PERFORMED  Chevette Fee Karis Juba 04/05/2020, 8:50 AM

## 2020-04-06 ENCOUNTER — Ambulatory Visit (HOSPITAL_COMMUNITY): Payer: Medicare Other

## 2020-04-06 ENCOUNTER — Encounter (HOSPITAL_COMMUNITY): Payer: Medicare Other | Admitting: Occupational Therapy

## 2020-04-06 ENCOUNTER — Inpatient Hospital Stay (HOSPITAL_COMMUNITY): Payer: Medicare Other

## 2020-04-06 LAB — CBC
HCT: 27.4 % — ABNORMAL LOW (ref 39.0–52.0)
Hemoglobin: 9 g/dL — ABNORMAL LOW (ref 13.0–17.0)
MCH: 28.3 pg (ref 26.0–34.0)
MCHC: 32.8 g/dL (ref 30.0–36.0)
MCV: 86.2 fL (ref 80.0–100.0)
Platelets: 414 10*3/uL — ABNORMAL HIGH (ref 150–400)
RBC: 3.18 MIL/uL — ABNORMAL LOW (ref 4.22–5.81)
RDW: 14.8 % (ref 11.5–15.5)
WBC: 5.4 10*3/uL (ref 4.0–10.5)
nRBC: 0 % (ref 0.0–0.2)

## 2020-04-06 LAB — BASIC METABOLIC PANEL
Anion gap: 8 (ref 5–15)
BUN: 23 mg/dL (ref 8–23)
CO2: 24 mmol/L (ref 22–32)
Calcium: 8.5 mg/dL — ABNORMAL LOW (ref 8.9–10.3)
Chloride: 102 mmol/L (ref 98–111)
Creatinine, Ser: 1.52 mg/dL — ABNORMAL HIGH (ref 0.61–1.24)
GFR, Estimated: 46 mL/min — ABNORMAL LOW (ref >60)
Glucose, Bld: 89 mg/dL (ref 70–99)
Potassium: 3.8 mmol/L (ref 3.5–5.1)
Sodium: 134 mmol/L — ABNORMAL LOW (ref 135–145)

## 2020-04-06 LAB — PROTIME-INR
INR: 2.3 — ABNORMAL HIGH (ref 0.8–1.2)
Prothrombin Time: 24.7 seconds — ABNORMAL HIGH (ref 11.4–15.2)

## 2020-04-06 MED ORDER — PANTOPRAZOLE SODIUM 40 MG PO TBEC: DELAYED_RELEASE_TABLET | ORAL | 3 refills | Status: DC

## 2020-04-06 MED ORDER — DOCUSATE SODIUM 100 MG PO CAPS: 200.0000 mg | ORAL_CAPSULE | Freq: Every day | ORAL | 0 refills | Status: DC

## 2020-04-06 MED ORDER — WARFARIN SODIUM 5 MG PO TABS
5.0000 mg | ORAL_TABLET | Freq: Every day | ORAL | Status: DC
  Administered 2020-04-06: 5 mg via ORAL
  Filled 2020-04-06: qty 1

## 2020-04-06 MED ORDER — SIMVASTATIN 10 MG PO TABS: 10.0000 mg | ORAL_TABLET | Freq: Every day | ORAL | 3 refills | Status: DC

## 2020-04-06 MED ORDER — LEVOTHYROXINE SODIUM 25 MCG PO TABS: 25.0000 ug | ORAL_TABLET | Freq: Every day | ORAL | 1 refills | Status: DC

## 2020-04-06 MED ORDER — ACETAMINOPHEN 325 MG PO TABS: 325.0000 mg | ORAL_TABLET | ORAL | Status: AC | PRN

## 2020-04-06 MED ORDER — AMIODARONE HCL 200 MG PO TABS: 200.0000 mg | ORAL_TABLET | Freq: Every day | ORAL | 1 refills | Status: DC

## 2020-04-06 MED ORDER — METHOCARBAMOL 500 MG PO TABS: 500.0000 mg | ORAL_TABLET | Freq: Three times a day (TID) | ORAL | 0 refills | Status: DC | PRN

## 2020-04-06 MED ORDER — POTASSIUM CHLORIDE CRYS ER 20 MEQ PO TBCR: 20.0000 meq | EXTENDED_RELEASE_TABLET | Freq: Every day | ORAL | 0 refills | Status: DC

## 2020-04-06 MED ORDER — FUROSEMIDE 20 MG PO TABS: 20.0000 mg | ORAL_TABLET | Freq: Every day | ORAL | 11 refills | Status: DC

## 2020-04-06 MED ORDER — ALBUTEROL SULFATE HFA 108 (90 BASE) MCG/ACT IN AERS: INHALATION_SPRAY | RESPIRATORY_TRACT | 2 refills | Status: DC

## 2020-04-06 MED ORDER — TRAMADOL HCL 50 MG PO TABS: 50.0000 mg | ORAL_TABLET | ORAL | 0 refills | Status: DC | PRN

## 2020-04-06 MED ORDER — ADULT MULTIVITAMIN W/MINERALS CH: 1.0000 | ORAL_TABLET | Freq: Every day | ORAL | Status: DC

## 2020-04-06 MED ORDER — MONTELUKAST SODIUM 10 MG PO TABS: ORAL_TABLET | ORAL | 3 refills | Status: DC

## 2020-04-06 MED ORDER — FE FUMARATE-B12-VIT C-FA-IFC PO CAPS: 1.0000 | ORAL_CAPSULE | Freq: Every day | ORAL | 0 refills | Status: DC

## 2020-04-06 MED ORDER — SIMVASTATIN 10 MG PO TABS
10.0000 mg | ORAL_TABLET | Freq: Every day | ORAL | 3 refills | Status: DC
Start: 2020-04-06 — End: 2020-04-06

## 2020-04-06 MED ORDER — METOPROLOL SUCCINATE ER 25 MG PO TB24: 25.0000 mg | ORAL_TABLET | Freq: Every day | ORAL | 1 refills | Status: DC

## 2020-04-06 NOTE — Progress Notes (Signed)
[facility] will sign off.   Medication Recommendations: Continue Amiodarone 200mg  qd, Torpol XL 25mg  qd and Zocor 10mg  qd  Other recommendations (labs, testing, etc):  Coumadin per pharmacy  Follow up as an outpatient: Arranged on 1/17 with , PA-C

## 2020-04-06 NOTE — Progress Notes (Addendum)
Patient ID: Patrick Bell, male   DOB: 1940-11-17, 80 y.o.   MRN: [id number] Have set up with [facility] for PT and RN and rolling walker to be delivered today to room. Waiting on MD to see if discharge tomorrow versus Wed.  10:30 am Pt got walker from neighbor no other equipment needs  11:58 aM Pt has changed his mind and now wants rolling walker-have re-ordered rolling walker from [employer]

## 2020-04-06 NOTE — Progress Notes (Signed)
ANTICOAGULATION CONSULT NOTE - Follow Up Consult  Pharmacy Consult for warfarin Indication: atrial fibrillation  No Known Allergies  Patient Measurements: Height: 5\' 10"  (177.8 cm) Weight: 79 kg (174 lb 2.6 oz) IBW/kg (Calculated) : 73 kg  Vital Signs: Temp: 98 F (36.7 C) (01/03 0418) BP: 148/70 (01/03 0418) Pulse Rate: 69 (01/03 0418)  Labs: Recent Labs    04/04/20 0651 04/04/20 0900 04/05/20 0516 04/06/20 0601  HGB 9.6*  --  8.9* 9.0*  HCT 28.7*  --  28.8* 27.4*  PLT 396  --  333 414*  LABPROT  --  24.3* 23.9*  --   INR  --  2.3* 2.2*  --     Estimated Creatinine Clearance: 39.2 mL/min (A) (by C-G formula based on SCr of 1.55 mg/dL (H)).   Medications:  Medications Prior to Admission  Medication Sig Dispense Refill Last Dose  . acetaminophen (TYLENOL) 500 MG tablet Take 500 mg by mouth daily as needed (pain.).     06/04/20 amiodarone (PACERONE) 200 MG tablet Take 1 tablet (200 mg total) by mouth daily. 30 tablet 1   . ferrous fumarate-b12-vitamic C-folic acid (TRINSICON / FOLTRIN) capsule Take 1 capsule by mouth daily with breakfast.     . furosemide (LASIX) 40 MG tablet Take 1 tablet (40 mg total) by mouth daily for 3 days. 3 tablet    . hydroxypropyl methylcellulose / hypromellose (ISOPTO TEARS / GONIOVISC) 2.5 % ophthalmic solution Place 1-2 drops into both eyes 3 (three) times daily as needed (dry/irritated eyes.).     Marland Kitchen levothyroxine (SYNTHROID) 25 MCG tablet Take 1 tablet (25 mcg total) by mouth daily at 6 (six) AM. 30 tablet 1   . metoprolol succinate (TOPROL-XL) 25 MG 24 hr tablet Take 1 tablet (25 mg total) by mouth daily. 30 tablet 1   . montelukast (SINGULAIR) 10 MG tablet Take 1 tablet (10 mg total) daily by mouth. (Patient taking differently: Take 10 mg by mouth at bedtime. ) 90 tablet 3   . Olopatadine HCl (PATADAY) 0.2 % SOLN Place 1 drop into both eyes daily as needed (allergies).      . pantoprazole (PROTONIX) 40 MG tablet Take 1 tablet (40 mg total) daily  by mouth. (Patient taking differently: Take 40 mg by mouth at bedtime. ) 90 tablet 3   . potassium chloride SA (KLOR-CON) 20 MEQ tablet Take 1 tablet (20 mEq total) by mouth 2 (two) times daily for 3 days. 6 tablet 0   . PROAIR HFA 108 (90 Base) MCG/ACT inhaler Inhale 2 puffs into the lungs every 6 (six) hours as needed for wheezing. (Patient taking differently: Inhale 2 puffs into the lungs every 6 (six) hours as needed for wheezing. ) 8.5 g 2   . simvastatin (ZOCOR) 10 MG tablet Take 1 tablet (10 mg total) by mouth at bedtime. 90 tablet 3   . traMADol (ULTRAM) 50 MG tablet Take 1-2 tablets (50-100 mg total) by mouth every 4 (four) hours as needed for moderate pain. 30 tablet    . warfarin (COUMADIN) 3 MG tablet Take 1 tablet (3 mg total) by mouth daily at 4 PM. 30 tablet 1    Scheduled:  . (feeding supplement) PROSource Plus  30 mL Oral BID BM  . amiodarone  200 mg Oral Daily  . docusate sodium  200 mg Oral Daily  . ferrous fumarate-b12-vitamic C-folic acid  1 capsule Oral BID PC  . furosemide  20 mg Oral Daily  . levothyroxine  25 mcg  Oral Q0600  . mouth rinse  15 mL Mouth Rinse BID  . metoprolol succinate  25 mg Oral Daily  . montelukast  10 mg Oral QHS  . multivitamin with minerals  1 tablet Oral Daily  . pantoprazole  40 mg Oral QHS  . potassium chloride  20 mEq Oral Daily  . Ensure Max Protein  11 oz Oral BID  . simvastatin  10 mg Oral QHS  . Warfarin - Pharmacist Dosing Inpatient   Does not apply q1600   Infusions:   PRN: acetaminophen, albuterol, alum & mag hydroxide-simeth, benzonatate, bisacodyl, diphenhydrAMINE, guaiFENesin-dextromethorphan, methocarbamol, polyethylene glycol, prochlorperazine **OR** prochlorperazine **OR** prochlorperazine, sodium phosphate, traMADol, traZODone Anti-infectives (From admission, onward)   None      Assessment: 80 yo male transferred from [facility] to [place] ([facility]) s/p MVR, repair of iatrogenic acute type A aortic  dissection, and subxyphoid pericardial fluid drainage with chest tube insertion. Pharmacy is consulted to dose warfarin for atrial fibrillation (warfarin was managed by TCTS during inpatient admission).  INR this morning is stable and therapeutic at 2.3. CBC is relatively stable. There have been no new interacting medications started or stopped, the patient remains on amiodarone and simvastatin. No signs or symptoms of bleeding are noted. The patient has not had any significant diet changes.  Goal of Therapy:  INR 2-3 Monitor platelets by anticoagulation protocol: Yes   Plan:  Will start warfarin 5mg  daily with stable INR Monitor daily PT/INR Monitor for drug interactions and diet changes Monitor for signs and symptoms of bleeding  Erin Hearing PharmD., BCPS Clinical Pharmacist 04/06/2020 7:33 AM

## 2020-04-06 NOTE — Discharge Summary (Signed)
Physical Therapy Discharge Summary  Patient Details  Name: Patrick Bell MRN: [id number] Date of Birth: 11-16-1940  Today's Date: 04/06/2020 PT Individual Time: 1100-1200 + [phone] PT Individual Time Calculation (min): 60 min  + 58 min  Patient has met 8 of 8 long term goals due to improved activity tolerance, improved balance, improved postural control, increased strength and decreased pain.  Patient to discharge at an ambulatory level Supervision. Patient's care partner is independent to provide the necessary physical & cognitive assistance at discharge.  Reasons goals not met: n/a  Recommendation:  Patient will benefit from ongoing skilled PT services in home health setting to continue to advance safe functional mobility, address ongoing impairments in dynamic balance and generalized deconditioning in order to minimize fall risk.  Equipment: RW  Reasons for discharge: treatment goals met and discharge from hospital  Patient/family agrees with progress made and goals achieved: Yes  PT Discharge Precautions/Restrictions Precautions Precautions: Sternal;Fall Precaution Booklet Issued: No Precaution Comments: reviewed "move in the tube" and no hard pushing/pulling with UEs, Restrictions Weight Bearing Restrictions: No Other Position/Activity Restrictions: Sternal precautions Vision/Perception  Perception Perception: Within Functional Limits Praxis Praxis: Intact  Cognition Overall Cognitive Status: Within Functional Limits for tasks assessed Arousal/Alertness: Awake/alert Orientation Level: Oriented X4 Attention: Sustained;Selective Focused Attention: Appears intact Sustained Attention: Appears intact Awareness: Appears intact Problem Solving: Appears intact Safety/Judgment: Appears intact Sensation Sensation Light Touch: Appears Intact Hot/Cold: Appears Intact Proprioception: Appears Intact Stereognosis: Appears Intact Motor  Motor Motor: Within Functional  Limits Motor - Skilled Clinical Observations: General deconditioning s/p MVP and aortic repair. Pain limiting with L hip (muscle cramping)  Mobility Bed Mobility Bed Mobility: Rolling Right;Rolling Left;Right Sidelying to Sit;Supine to Sit;Sit to Supine Rolling Right: Independent Rolling Left: Independent Right Sidelying to Sit: Independent Supine to Sit: Independent Sit to Supine: Independent Transfers Transfers: Sit to Stand;Stand to Sit;Stand Pivot Transfers Sit to Stand: Independent with assistive device Stand to Sit: Independent with assistive device Stand Pivot Transfers: Independent with assistive device Transfer (Assistive device): Rolling walker Locomotion  Gait Ambulation: Yes Gait Assistance: Supervision/Verbal cueing Gait Distance (Feet): 500 Feet Assistive device: Rolling walker Gait Assistance Details: Verbal cues for safe use of DME/AE;Verbal cues for gait pattern;Verbal cues for precautions/safety Gait Assistance Details: Cues for erect posture Gait Gait: Yes Gait Pattern: Within Functional Limits Gait velocity: decreased Stairs / Additional Locomotion Stairs: Yes Stairs Assistance: Supervision/Verbal cueing Stair Management Technique: One rail Right Number of Stairs: 12 Height of Stairs: 6 Ramp: Supervision/Verbal cueing Curb: Supervision/Verbal cueing Wheelchair Mobility Wheelchair Mobility: No  Trunk/Postural Assessment  Cervical Assessment Cervical Assessment: Within Functional Limits Thoracic Assessment Thoracic Assessment: Exceptions to [facility] (rounded shoulders) Lumbar Assessment Lumbar Assessment: Within Functional Limits Postural Control Postural Control: Within Functional Limits  Balance Balance Balance Assessed: Yes Standardized Balance Assessment Standardized Balance Assessment: Berg Balance Test Berg Balance Test Sit to Stand: Able to stand without using hands and stabilize independently Standing Unsupported: Able to stand safely 2  minutes Sitting with Back Unsupported but Feet Supported on Floor or Stool: Able to sit safely and securely 2 minutes Stand to Sit: Sits safely with minimal use of hands Transfers: Able to transfer safely, minor use of hands Standing Unsupported with Eyes Closed: Able to stand 10 seconds safely Standing Ubsupported with Feet Together: Able to place feet together independently and stand 1 minute safely From Standing, Reach Forward with Outstretched Arm: Can reach forward >12 cm safely (5") From Standing Position, Pick up Object from Floor: Able to pick up  shoe safely and easily From Standing Position, Turn to Look Behind Over each Shoulder: Looks behind from both sides and weight shifts well Turn 360 Degrees: Able to turn 360 degrees safely in 4 seconds or less Standing Unsupported, Alternately Place Feet on Step/Stool: Able to complete 4 steps without aid or supervision Standing Unsupported, One Foot in Front: Able to plae foot ahead of the other independently and hold 30 seconds Standing on One Leg: Able to lift leg independently and hold equal to or more than 3 seconds Total Score: 50 Static Sitting Balance Static Sitting - Balance Support: Feet supported Static Sitting - Level of Assistance: 7: Independent Dynamic Sitting Balance Dynamic Sitting - Balance Support: Feet supported Dynamic Sitting - Level of Assistance: 7: Independent Static Standing Balance Static Standing - Balance Support: No upper extremity supported Static Standing - Level of Assistance: 7: Independent Dynamic Standing Balance Dynamic Standing - Balance Support: During functional activity Dynamic Standing - Level of Assistance: 6: Modified independent (Device/Increase time) Extremity Assessment  RUE Assessment RUE Assessment: Within Functional Limits General Strength Comments: not fully assessed due to sternal precautions; able to complete light activity within restrictions LUE Assessment LUE Assessment: Within  Functional Limits General Strength Comments: not fully assessed due to sternal precautions; able to complete light activity within restrictions RLE Assessment RLE Assessment: Within Functional Limits LLE Assessment LLE Assessment: Within Functional Limits  Skilled Intervention:  1st session: Pt greeted seated in chair, agreeable to PT session. Reports no pain. Focus of session to provide family education and training for preparation in upcoming DC where pt will be having 24/7 S/A from his supportive wife. Wife and patient with plenty of reasonable questions regarding upcoming DC. Lengthy discussion held with them both regarding home safety training, DME rec's, role of f/u therapy services, sternal precautions (move-in-the-tube) and how they relate to functional mobility. All questions were concerned/addressed and patient/wife was appreciative. Performed sit<>stand transfers mod I with RW. Ambulated throughout session, >535f, with supervision and RW. Wife present throughout for training/education. Performed car transfer with supervision and RW. Also performed stairs, up/down x12, with supervision and 1 hand rail support. He completed curb training, x5 bouts total, with RW and supervision, cues for general sequencing. He then performed bed mobility with bed in ADL apartment, performed indep via log rolling technique. Pt ended session seated in chair in his room with wife at bedside, needs in reach, and chair alarm on.  2nd session: Pt received sleeping in bed, awakens easily to voice, and agreeable to PT session. He reports no pain but general sleepiness which he contributes to last nights sleeping pill, reports he will try to wean from that as able to improve daytime alertness. Supine<>sit mod I with bed features. Sit<>stand mod I to RW and he ambulated to main therapy gym with supervision and RW to main gym. Cues for postural awareness during gait. Performed 5xSTS, scoring 15seconds which is a 14  second improvement since evaluation where he scored 29 seconds. He also performed the TUG (timed up and go) where he scored 21 seconds which is a 15 second improvement since evaluation where he scored 36 seconds. Pt then performed 6MWT, where he ambulated a total of 635fwith RW and supervision. No standing rest break noted but pt did report moderate fatigue after completion. This is an improvement since evaluation, where he ambulated 5603fn the 6MWT.  Ambulated with supervision and RW and to ortho gym and transferred to Nustep with supervision and RW. Performed Nustep for  x10 minutes at workload 5 using BLE's only with cadence of ~40-50 steps/minute, focusing on BLE strengthening as well as cardiorespiratory endurance. Pt reporting a 7/10 level of fatigue on the self reported RPE scale. Recovers to baseline with a few minutes of seated rest. He ambulated back to his room with supervision and RW where he then reported need to void. Stand>sit to toilet with distant supervision and RW where he was continent of bladder, charted. Ambulated back to his bed with supervision and RW and sit?>supine mod I with bed features. Ended session supine in bed with bed alarm on and needs in reach, pt thankful for therapist intervention during his rehabilitation stay.  Emmaleah Meroney P Chen Holzman PT, DPT 04/06/2020, 12:45 PM

## 2020-04-06 NOTE — Progress Notes (Signed)
[place] PHYSICAL MEDICINE & REHABILITATION PROGRESS NOTE  Subjective/Complaints: He would like to go home. Discussed that I would message his team and plan for d/c tomorrow if they are in agreement.  Sutures removed last night.  Hgb improved to 9 Cr trending down to 1.52   ROS: +noncardiac right chest wall pain.  Denies nausea, vomiting, diarrhea. Swelling has improved.     Objective: Vital Signs: Blood pressure (!) 148/70, pulse 69, temperature 98 F (36.7 C), resp. rate 17, height 5\' 10"  (1.778 m), weight 79 kg, SpO2 95 %. No results found. Recent Labs    04/05/20 0516 04/06/20 0601  WBC 5.5 5.4  HGB 8.9* 9.0*  HCT 28.8* 27.4*  PLT 333 414*   Recent Labs    04/06/20 0601  NA 134*  K 3.8  CL 102  CO2 24  GLUCOSE 89  BUN 23  CREATININE 1.52*  CALCIUM 8.5*    Intake/Output Summary (Last 24 hours) at 04/06/2020 0828 Last data filed at 04/06/2020 0737 Gross per 24 hour  Intake 315 ml  Output --  Net 315 ml    Physical Exam: BP (!) 148/70 (BP Location: Right Arm)   Pulse 69   Temp 98 F (36.7 C)   Resp 17   Ht 5\' 10"  (1.778 m)   Wt 79 kg   SpO2 95%   BMI 24.99 kg/m  Gen: no distress, normal appearing HEENT: oral mucosa pink and moist, NCAT Cardio: Reg rate Chest: normal effort, normal rate of breathing Abd: soft, non-distended Ext: Edema is much improved Skin: Warm and dry.  Incisions with sutures removed, C/D/I Psych: Normal mood.  Normal behavior. Musc: No edema in extremities.   Right chest wall soreness  Neuro: Alert Motor: Bilateral upper extremities: 4/5 proximal distal Bilateral lower extremities: 4/5 proximal distal  Assessment/Plan: 1. Functional deficits which require 3+ hours per day of interdisciplinary therapy in a comprehensive inpatient rehab setting.  Physiatrist is providing close team supervision and 24 hour management of active medical problems listed below.  Physiatrist and rehab team continue to assess barriers to  discharge/monitor patient progress toward functional and medical goals   Care Tool:  Bathing    Body parts bathed by patient: Right arm,Left arm,Front perineal area,Buttocks,Right upper leg,Left upper leg,Face     Body parts n/a: Left lower leg,Right lower leg,Abdomen,Chest   Bathing assist Assist Level: Minimal Assistance - Patient > 75%     Upper Body Dressing/Undressing Upper body dressing   What is the patient wearing?: Pull over shirt    Upper body assist Assist Level: Minimal Assistance - Patient > 75%    Lower Body Dressing/Undressing Lower body dressing      What is the patient wearing?: Pants,Incontinence brief     Lower body assist Assist for lower body dressing: Moderate Assistance - Patient 50 - 74%     Toileting Toileting    Toileting assist Assist for toileting: Minimal Assistance - Patient > 75% Assistive Device Comment: front wheel walker   Transfers Chair/bed transfer  Transfers assist     Chair/bed transfer assist level: Supervision/Verbal cueing     Locomotion Ambulation   Ambulation assist      Assist level: Supervision/Verbal cueing Assistive device: Walker-rolling Max distance: 2106ft   Walk 10 feet activity   Assist     Assist level: Supervision/Verbal cueing Assistive device: Walker-rolling   Walk 50 feet activity   Assist    Assist level: Supervision/Verbal cueing Assistive device: Walker-rolling    Walk  150 feet activity   Assist Walk 150 feet activity did not occur: Safety/medical concerns  Assist level: Supervision/Verbal cueing Assistive device: Walker-rolling    Walk 10 feet on uneven surface  activity   Assist Walk 10 feet on uneven surfaces activity did not occur: Safety/medical concerns   Assist level: Supervision/Verbal cueing Assistive device: Photographer Will patient use wheelchair at discharge?: No             Wheelchair 50 feet with 2 turns  activity    Assist            Wheelchair 150 feet activity     Assist           Medical Problem List and Plan: 1.  Impaired mobility secondary to mitral valve repair on 03/11/2020 with pericardial effusion on 03/22/2020, further complicated by atrial fibrillation.   Continue CIR  DC sutures 2.  Antithrombotics: -DVT/anticoagulation:  Pharmaceutical: Coumadin (goal 2.0-3.0) INR therapeutic at 2.3 on 1/3, monitor daily.              -antiplatelet therapy: N/a 3. Pain Management:   Tramadol PRN.   Robaxin as needed started on 12/29, scheduled on 12/30, changed to as needed on 1/2  Improved 4. Mood: LCSW to follow for evaluation and support.              -antipsychotic agents: N/a 5. Neuropsych: This patient is capable of making decisions on his own behalf. 6. Skin/Wound Care: Monitor wound for healing. Routine pressure relief measures.  7. Fluids/Electrolytes/Nutrition: Monitor I/Os.    Added multivitamin  8.  Fluid overload: Monitor weights daily and for other signs of overload  Lasix decreased to once/day on 12/27, decreased to 20 on 1/3.   Low salt diet, added TEDs and elevation to help manage BLE edema.   DC fluid restriciton Filed Weights   04/04/20 0351 04/05/20 0457 04/06/20 0418  Weight: 78.7 kg 79.3 kg 79 kg   Relatively stable on 1/2 9.  New onset A fib/PAF: Continue amiodarone, toprol XL. 10. Acute blood loss anemia:   Increased iron to bid   Hemoglobin 8.9 on 1/2  Continue to monitor 11. Hypothyroid: TSH- 6.5--new diagnosis   Synthroid started on 12/21  TSH to be rechecked in 4 weeks.   12. Acute kidney injury:  Scr improved from peak of 2.02  ~1.4 (?  New baseline)   Creatinine 1.55 on 12/31, down to 1.52 on 1/3, edema and weight improved- d/c Lasix (last dose 1/3).   See #8  Continue to monitor 13. OSA: Has not been using machine due to intolerance but plans on trialing it again after discharge.  14. Overweight: BMI 26- provide education.  15.   Severe hypoalbuminemia  Supplement initiated on 12/29 16.  Elevated blood pressure  Elevated and?  Trending up on 1/2, will consider medications if persistent 17.  Hypokalemia-secondary to Lasix  Potassium 4.5 on 12/31, 3.8 on 1/3, monitor weekly.  18. Disposition: Plan for d/c home tomorrow. SW is working on home health.   LOS: 6 days A FACE TO FACE EVALUATION WAS PERFORMED  Drema Pry Karan Inclan 04/06/2020, 8:28 AM

## 2020-04-07 DIAGNOSIS — I951 Orthostatic hypotension: Secondary | ICD-10-CM

## 2020-04-07 LAB — CBC
HCT: 28.4 % — ABNORMAL LOW (ref 39.0–52.0)
Hemoglobin: 8.8 g/dL — ABNORMAL LOW (ref 13.0–17.0)
MCH: 27.2 pg (ref 26.0–34.0)
MCHC: 31 g/dL (ref 30.0–36.0)
MCV: 87.9 fL (ref 80.0–100.0)
Platelets: 389 10*3/uL (ref 150–400)
RBC: 3.23 MIL/uL — ABNORMAL LOW (ref 4.22–5.81)
RDW: 14.7 % (ref 11.5–15.5)
WBC: 5.4 10*3/uL (ref 4.0–10.5)
nRBC: 0 % (ref 0.0–0.2)

## 2020-04-07 LAB — PROTIME-INR
INR: 2.5 — ABNORMAL HIGH (ref 0.8–1.2)
Prothrombin Time: 26.2 seconds — ABNORMAL HIGH (ref 11.4–15.2)

## 2020-04-07 MED ORDER — WARFARIN SODIUM 5 MG PO TABS: 5.0000 mg | ORAL_TABLET | Freq: Every day | ORAL | 0 refills | Status: DC

## 2020-04-07 NOTE — Progress Notes (Signed)
Inpatient Rehabilitation Care Coordinator Discharge Note  The overall goal for the admission was met for:   Discharge location: Yes-HOME WITH WIFE WHO CAN PROVIDE 24/7 SUPERVISION  Length of Stay: Yes-7 DAYS  Discharge activity level: Yes-SUPERVISION LEVEL  Home/community participation: Yes  Services provided included: MD, RD, PT, OT, RN, CM, Pharmacy and Neuropsych,SW  Financial Services: Private Insurance: [place] offered to/list presented to:YES  Follow-up services arranged: Home Health: [facility], DME: [facility] and Patient/Family has no preference for HH/DME agencies  Comments (or additional information): WIFE WAS [place]. BOTH FEEL COMFORTABLE WITH CARE AT HOME  Patient/Family verbalized understanding of follow-up arrangements: Yes  Individual responsible for coordination of the follow-up plan: GAYLE-WIFE [phone]  Confirmed correct DME delivered: Elease Hashimoto 04/07/2020    Elease Hashimoto

## 2020-04-07 NOTE — Progress Notes (Signed)
[place] PHYSICAL MEDICINE & REHABILITATION PROGRESS NOTE  Subjective/Complaints: Patient seen laying in bed this morning.  States he slept well overnight.  Ready for discharge.  ROS: + Right chest wall pain.  Denies nausea, vomiting, diarrhea. Swelling has improved.     Objective: Vital Signs: Blood pressure (!) 155/69, pulse 69, temperature 98.2 F (36.8 C), resp. rate 16, height 5\' 10"  (1.778 m), weight 78.1 kg, SpO2 98 %. No results found. Recent Labs    04/06/20 0601 04/07/20 0509  WBC 5.4 5.4  HGB 9.0* 8.8*  HCT 27.4* 28.4*  PLT 414* 389   Recent Labs    04/06/20 0601  NA 134*  K 3.8  CL 102  CO2 24  GLUCOSE 89  BUN 23  CREATININE 1.52*  CALCIUM 8.5*    Intake/Output Summary (Last 24 hours) at 04/07/2020 1012 Last data filed at 04/07/2020 0715 Gross per 24 hour  Intake 840 ml  Output -  Net 840 ml    Physical Exam: BP (!) 155/69 (BP Location: Right Arm)   Pulse 69   Temp 98.2 F (36.8 C)   Resp 16   Ht 5\' 10"  (1.778 m)   Wt 78.1 kg   SpO2 98%   BMI 24.71 kg/m  Constitutional: No distress . Vital signs reviewed. HENT: Normocephalic.  Atraumatic. Eyes: EOMI. No discharge. Cardiovascular: No JVD.  RRR. Respiratory: Normal effort.  No stridor.  Bilateral clear to auscultation. GI: Non-distended.  BS +. Skin: Warm and dry.  Incisions healing. Psych: Normal mood.  Normal behavior. Musc: No edema in extremities.  No tenderness in extremities. Neuro: Alert Motor: Bilateral upper extremities: 4/5 proximal distal, improving Bilateral lower extremities: 4/5 proximal distal, improving  Assessment/Plan: 1. Functional deficits which require 3+ hours per day of interdisciplinary therapy in a comprehensive inpatient rehab setting.  Physiatrist is providing close team supervision and 24 hour management of active medical problems listed below.  Physiatrist and rehab team continue to assess barriers to discharge/monitor patient progress toward functional and  medical goals   Care Tool:  Bathing    Body parts bathed by patient: Right arm,Left arm,Front perineal area,Buttocks,Right upper leg,Left upper leg,Face     Body parts n/a: Left lower leg,Right lower leg,Abdomen,Chest   Bathing assist Assist Level: Minimal Assistance - Patient > 75%     Upper Body Dressing/Undressing Upper body dressing   What is the patient wearing?: Pull over shirt    Upper body assist Assist Level: Minimal Assistance - Patient > 75%    Lower Body Dressing/Undressing Lower body dressing      What is the patient wearing?: Pants,Incontinence brief     Lower body assist Assist for lower body dressing: Moderate Assistance - Patient 50 - 74%     Toileting Toileting    Toileting assist Assist for toileting: Minimal Assistance - Patient > 75% Assistive Device Comment: front wheel walker   Transfers Chair/bed transfer  Transfers assist     Chair/bed transfer assist level: Independent with assistive device     Locomotion Ambulation   Ambulation assist      Assist level: Supervision/Verbal cueing Assistive device: Walker-rolling Max distance: >613ft   Walk 10 feet activity   Assist     Assist level: Supervision/Verbal cueing Assistive device: Walker-rolling   Walk 50 feet activity   Assist    Assist level: Supervision/Verbal cueing Assistive device: Walker-rolling    Walk 150 feet activity   Assist Walk 150 feet activity did not occur: Safety/medical concerns  Assist  level: Supervision/Verbal cueing Assistive device: Walker-rolling    Walk 10 feet on uneven surface  activity   Assist Walk 10 feet on uneven surfaces activity did not occur: Safety/medical concerns   Assist level: Supervision/Verbal cueing Assistive device: Aeronautical engineer Will patient use wheelchair at discharge?: No             Wheelchair 50 feet with 2 turns activity    Assist            Wheelchair  150 feet activity     Assist           Medical Problem List and Plan: 1.  Impaired mobility secondary to mitral valve repair on 03/11/2020 with pericardial effusion on 0000000, further complicated by atrial fibrillation.   DC today  Will see patient for hospital follow-up in 1 month post-discharge 2.  Antithrombotics: -DVT/anticoagulation:  Pharmaceutical: Coumadin (goal 2.0-3.0) INR therapeutic on 1/4.              -antiplatelet therapy: N/a 3. Pain Management:   Tramadol PRN.   Robaxin as needed started on 12/29, scheduled on 12/30, changed to as needed on 1/2  Improved 4. Mood: LCSW to follow for evaluation and support.              -antipsychotic agents: N/a 5. Neuropsych: This patient is capable of making decisions on his own behalf. 6. Skin/Wound Care: Monitor wound for healing. Routine pressure relief measures.  7. Fluids/Electrolytes/Nutrition: Monitor I/Os.    Added multivitamin  8.  Fluid overload: Monitor weights daily and for other signs of overload  Lasix decreased to once/day on 12/27, decreased to 20 on 1/3, DC'd on 1/4.   Low salt diet, added TEDs and elevation to help manage BLE edema.   DC fluid restriciton Filed Weights   04/05/20 0457 04/06/20 0418 04/07/20 0517  Weight: 79.3 kg 79 kg 78.1 kg   Relatively stable on 1/4 9.  New onset A fib/PAF: Continue amiodarone, toprol XL. 10. Acute blood loss anemia:   Increased iron to bid   Hemoglobin 8.8 on 1/4  Continue to monitor 11. Hypothyroid: TSH- 6.5--new diagnosis   Synthroid started on 12/21  TSH to be rechecked in 4 weeks.   12. Acute kidney injury:  Scr improved from peak of 2.02  ~1.4 (?  New baseline)   Creatinine 1.52 on 1/3.   See #8  Continue to monitor 13. OSA: Has not been using machine due to intolerance but plans on trialing it again after discharge.  14. Overweight: BMI 26- provide education.  15.  Severe hypoalbuminemia  Supplement initiated on 12/29 16.  Orthostatic  pressure  Orthostasis on 1/4, monitor ambulatory setting with potential further adjustments as necessary 17.  Hypokalemia-secondary to Lasix  Potassium 3.8 on 1/3  > 30 minutes spent in total in discharge planning between myself and PA regarding aforementioned, as well discussion regarding DME equipment, follow-up appointments, follow-up therapies, discharge medications, discharge recommendations, answering questions  LOS: 7 days A FACE TO FACE EVALUATION WAS PERFORMED  Ankit Lorie Phenix 04/07/2020, 10:12 AM

## 2020-04-07 NOTE — Progress Notes (Signed)
ANTICOAGULATION CONSULT NOTE - Follow Up Consult  Pharmacy Consult for warfarin Indication: atrial fibrillation  No Known Allergies  Patient Measurements: Height: 5\' 10"  (177.8 cm) Weight: 78.1 kg (172 lb 2.9 oz) IBW/kg (Calculated) : 73 kg  Vital Signs: Temp: 98.2 F (36.8 C) (01/04 0517) BP: 155/69 (01/04 0517) Pulse Rate: 69 (01/04 0517)  Labs: Recent Labs    04/05/20 0516 04/06/20 0601 04/07/20 0509  HGB 8.9* 9.0* 8.8*  HCT 28.8* 27.4* 28.4*  PLT 333 414* 389  LABPROT 23.9* 24.7* 26.2*  INR 2.2* 2.3* 2.5*  CREATININE  --  1.52*  --     Estimated Creatinine Clearance: 40 mL/min (A) (by C-G formula based on SCr of 1.52 mg/dL (H)).   Assessment: 80 yo male [facility] to [facility]) s/p MVR, repair of iatrogenic acute type A aortic dissection, and subxyphoid pericardial fluid drainage with chest tube insertion. Pharmacy is consulted to dose warfarin for atrial fibrillation (warfarin was managed by TCTS during inpatient admission).  INR this morning is stable and therapeutic at 2.5. CBC is stable. Eating 75-100% .   Goal of Therapy:  INR 2-3 Monitor platelets by anticoagulation protocol: Yes   Plan:  Discharge on warfarin 5mg  daily  Monitor PT/INR per Anticoagulation clinic  Monitor for drug interactions and diet changes Monitor for signs and symptoms of bleeding  [facility], PharmD, BCPS, BCCP Clinical Pharmacist  Please check AMION for all [facility] Pharmacy phone numbers After 10:00 PM, call Main Pharmacy [phone]

## 2020-04-08 DIAGNOSIS — I480 Paroxysmal atrial fibrillation: Secondary | ICD-10-CM | POA: Diagnosis not present

## 2020-04-08 DIAGNOSIS — M1990 Unspecified osteoarthritis, unspecified site: Secondary | ICD-10-CM | POA: Diagnosis not present

## 2020-04-08 DIAGNOSIS — G4733 Obstructive sleep apnea (adult) (pediatric): Secondary | ICD-10-CM | POA: Diagnosis not present

## 2020-04-08 DIAGNOSIS — E039 Hypothyroidism, unspecified: Secondary | ICD-10-CM | POA: Diagnosis not present

## 2020-04-08 DIAGNOSIS — I10 Essential (primary) hypertension: Secondary | ICD-10-CM | POA: Diagnosis not present

## 2020-04-08 DIAGNOSIS — N179 Acute kidney failure, unspecified: Secondary | ICD-10-CM | POA: Diagnosis not present

## 2020-04-08 DIAGNOSIS — Z48812 Encounter for surgical aftercare following surgery on the circulatory system: Secondary | ICD-10-CM | POA: Diagnosis not present

## 2020-04-08 DIAGNOSIS — J45909 Unspecified asthma, uncomplicated: Secondary | ICD-10-CM | POA: Diagnosis not present

## 2020-04-08 DIAGNOSIS — K5792 Diverticulitis of intestine, part unspecified, without perforation or abscess without bleeding: Secondary | ICD-10-CM | POA: Diagnosis not present

## 2020-04-08 DIAGNOSIS — E785 Hyperlipidemia, unspecified: Secondary | ICD-10-CM | POA: Diagnosis not present

## 2020-04-08 DIAGNOSIS — I951 Orthostatic hypotension: Secondary | ICD-10-CM | POA: Diagnosis not present

## 2020-04-08 DIAGNOSIS — E876 Hypokalemia: Secondary | ICD-10-CM | POA: Diagnosis not present

## 2020-04-08 NOTE — Progress Notes (Signed)
Occupational Therapy Discharge Summary  Patient Details  Name: Patrick Bell MRN: [id number] Date of Birth: 13-Mar-1941  Today's Date: 04/06/2020     Patient has met 6 of 7 long term goals due to improved activity tolerance, improved balance and ability to compensate for deficits and decreased pain.  Patient to discharge at overall Supervision level.  Patient's care partner is independent to provide the necessary cognitive assistance at discharge.    Reasons goals not met: Pt requires min assist for bathing at shower level primarily due to precautions at incision site and sternal.  Pt does meet goal of supervision level at sinkside level and anticipate shower level will improve when precautions discharged.  Recommendation:  Patient will not require further skilled OT follow up at this time.  Equipment: RW  Reasons for discharge: treatment goals met and discharge from hospital  Patient/family agrees with progress made and goals achieved: Yes   OT Discharge Precautions/Restrictions  Precautions Precautions: Sternal;Fall Precaution Comments: reviewed "move in the tube" and no hard pushing/pulling with UEs, Restrictions Weight Bearing Restrictions: No Other Position/Activity Restrictions: Sternal precautions ADL ADL Grooming: Modified independent Where Assessed-Grooming: Sitting at sink Upper Body Bathing: Minimal assistance Where Assessed-Upper Body Bathing: Shower Lower Body Bathing: Minimal assistance Where Assessed-Lower Body Bathing: Shower Upper Body Dressing: Modified independent (Device) Where Assessed-Upper Body Dressing: Sitting at sink Lower Body Dressing: Setup Where Assessed-Lower Body Dressing: Standing at sink Toileting: Supervision/safety Where Assessed-Toileting: Glass blower/designer: Close supervision Armed forces technical officer Method: Magazine features editor: Close supervision Social research officer, government Method: Heritage manager:  Radio broadcast assistant Vision Baseline Vision/History: Wears glasses Wears Glasses: At all times Patient Visual Report: No change from baseline Vision Assessment?: No apparent visual deficits Perception  Perception: Within Functional Limits Praxis Praxis: Intact Cognition Overall Cognitive Status: Within Functional Limits for tasks assessed Arousal/Alertness: Awake/alert Orientation Level: Oriented X4 Memory: Appears intact Awareness: Appears intact Problem Solving: Appears intact Safety/Judgment: Appears intact Sensation Sensation Light Touch: Appears Intact Hot/Cold: Appears Intact Proprioception: Appears Intact Stereognosis: Not tested Coordination Gross Motor Movements are Fluid and Coordinated: Yes Fine Motor Movements are Fluid and Coordinated: Yes Motor  Motor Motor: Within Functional Limits Motor - Skilled Clinical Observations: General deconditioning s/p MVP and aortic repair. Pain limiting with L hip (muscle cramping) Mobility  Bed Mobility Rolling Right: Independent Rolling Left: Independent Right Sidelying to Sit: Independent Supine to Sit: Independent Sit to Supine: Independent Transfers Sit to Stand: Independent with assistive device Stand to Sit: Independent with assistive device  Trunk/Postural Assessment  Cervical Assessment Cervical Assessment: Within Functional Limits Thoracic Assessment Thoracic Assessment: Within Functional Limits Lumbar Assessment Lumbar Assessment: Within Functional Limits  Balance Static Sitting Balance Static Sitting - Level of Assistance: 7: Independent Dynamic Sitting Balance Dynamic Sitting - Level of Assistance: 7: Independent Static Standing Balance Static Standing - Level of Assistance: 7: Independent Dynamic Standing Balance Dynamic Standing - Level of Assistance: 6: Modified independent (Device/Increase time) Extremity/Trunk Assessment RUE Assessment RUE Assessment: Within Functional Limits General Strength  Comments: not fully assessed due to sternal precautions; able to complete light activity within restrictions     Casmir Auguste L Safwan Tomei 04/08/2020, 4:01 PM

## 2020-04-09 ENCOUNTER — Telehealth: Payer: Self-pay | Admitting: *Deleted

## 2020-04-09 DIAGNOSIS — I951 Orthostatic hypotension: Secondary | ICD-10-CM | POA: Diagnosis not present

## 2020-04-09 DIAGNOSIS — K5792 Diverticulitis of intestine, part unspecified, without perforation or abscess without bleeding: Secondary | ICD-10-CM | POA: Diagnosis not present

## 2020-04-09 DIAGNOSIS — E039 Hypothyroidism, unspecified: Secondary | ICD-10-CM | POA: Diagnosis not present

## 2020-04-09 DIAGNOSIS — I480 Paroxysmal atrial fibrillation: Secondary | ICD-10-CM | POA: Diagnosis not present

## 2020-04-09 DIAGNOSIS — G4733 Obstructive sleep apnea (adult) (pediatric): Secondary | ICD-10-CM | POA: Diagnosis not present

## 2020-04-09 DIAGNOSIS — I10 Essential (primary) hypertension: Secondary | ICD-10-CM | POA: Diagnosis not present

## 2020-04-09 DIAGNOSIS — E785 Hyperlipidemia, unspecified: Secondary | ICD-10-CM | POA: Diagnosis not present

## 2020-04-09 DIAGNOSIS — N179 Acute kidney failure, unspecified: Secondary | ICD-10-CM | POA: Diagnosis not present

## 2020-04-09 DIAGNOSIS — J45909 Unspecified asthma, uncomplicated: Secondary | ICD-10-CM | POA: Diagnosis not present

## 2020-04-09 DIAGNOSIS — M1990 Unspecified osteoarthritis, unspecified site: Secondary | ICD-10-CM | POA: Diagnosis not present

## 2020-04-09 DIAGNOSIS — E876 Hypokalemia: Secondary | ICD-10-CM | POA: Diagnosis not present

## 2020-04-09 DIAGNOSIS — Z48812 Encounter for surgical aftercare following surgery on the circulatory system: Secondary | ICD-10-CM | POA: Diagnosis not present

## 2020-04-09 NOTE — Telephone Encounter (Signed)
[facility] called for POC for Mr Patrick Bell, 2 wk3.  Approval given.

## 2020-04-10 ENCOUNTER — Other Ambulatory Visit: Payer: Self-pay | Admitting: Thoracic Surgery (Cardiothoracic Vascular Surgery)

## 2020-04-10 ENCOUNTER — Telehealth: Payer: Self-pay

## 2020-04-10 ENCOUNTER — Other Ambulatory Visit: Payer: Self-pay | Admitting: Family

## 2020-04-10 ENCOUNTER — Encounter: Payer: Self-pay | Admitting: Cardiovascular Disease

## 2020-04-10 DIAGNOSIS — E039 Hypothyroidism, unspecified: Secondary | ICD-10-CM | POA: Diagnosis not present

## 2020-04-10 DIAGNOSIS — K5792 Diverticulitis of intestine, part unspecified, without perforation or abscess without bleeding: Secondary | ICD-10-CM | POA: Diagnosis not present

## 2020-04-10 DIAGNOSIS — I480 Paroxysmal atrial fibrillation: Secondary | ICD-10-CM | POA: Diagnosis not present

## 2020-04-10 DIAGNOSIS — M1990 Unspecified osteoarthritis, unspecified site: Secondary | ICD-10-CM | POA: Diagnosis not present

## 2020-04-10 DIAGNOSIS — N179 Acute kidney failure, unspecified: Secondary | ICD-10-CM | POA: Diagnosis not present

## 2020-04-10 DIAGNOSIS — E785 Hyperlipidemia, unspecified: Secondary | ICD-10-CM | POA: Diagnosis not present

## 2020-04-10 DIAGNOSIS — I951 Orthostatic hypotension: Secondary | ICD-10-CM | POA: Diagnosis not present

## 2020-04-10 DIAGNOSIS — E876 Hypokalemia: Secondary | ICD-10-CM | POA: Diagnosis not present

## 2020-04-10 DIAGNOSIS — I10 Essential (primary) hypertension: Secondary | ICD-10-CM | POA: Diagnosis not present

## 2020-04-10 DIAGNOSIS — I340 Nonrheumatic mitral (valve) insufficiency: Secondary | ICD-10-CM

## 2020-04-10 DIAGNOSIS — G4733 Obstructive sleep apnea (adult) (pediatric): Secondary | ICD-10-CM | POA: Diagnosis not present

## 2020-04-10 DIAGNOSIS — J45909 Unspecified asthma, uncomplicated: Secondary | ICD-10-CM | POA: Diagnosis not present

## 2020-04-10 DIAGNOSIS — Z48812 Encounter for surgical aftercare following surgery on the circulatory system: Secondary | ICD-10-CM | POA: Diagnosis not present

## 2020-04-10 MED ORDER — PHYTONADIONE 5 MG PO TABS: 2.5000 mg | ORAL_TABLET | Freq: Once | ORAL | 0 refills | Status: AC

## 2020-04-10 NOTE — Telephone Encounter (Signed)
error 

## 2020-04-10 NOTE — Telephone Encounter (Signed)
Below notes copied from orders only from [facility] and given to Bethany w/ [facility]  INR 5 (too thin) Goal   Hold warfarin throughout weekend (04/10/20- 04/12/20), then decrease 2.5 mg (1/2 tab) on Monday, Wednesday, Friday and Saturday. 5 mg (1 tab) all the other days.   Vit K 2.5 mg sent to pharmacy. Recheck INR Monday. If any blood in urine, stool, or active bleeding need to go to ED.

## 2020-04-10 NOTE — Telephone Encounter (Signed)
Nurse Arvil Chaco with [facility] is with patient right now at his home. Says his INR is 5.0 and PT is 60.1.. Needs orders put in for his Coumadin and when INR needs to be rechecked.  Please advise and call Arvil Chaco [phone]

## 2020-04-10 NOTE — Progress Notes (Signed)
Patrick Bell w/ [facility] aware of changes to coumadin, Vit K sent to pharmacy and to recheck on [facility]

## 2020-04-10 NOTE — Progress Notes (Signed)
   Description   INR 5 (too thin) Goal   Hold warfarin throughout weekend (04/10/20- 04/12/20), then decrease 2.5 mg (1/2 tab) on Monday, Wednesday, Friday and Saturday. 5 mg (1 tab) all the other days.   Vit K 2.5 mg sent to pharmacy. Recheck INR Monday. If any blood in urine, stool, or active bleeding need to go to ED.

## 2020-04-13 ENCOUNTER — Ambulatory Visit
Admission: RE | Admit: 2020-04-13 | Discharge: 2020-04-13 | Disposition: A | Discharge status: Home / Self Care | Payer: Medicare Other | Source: Ambulatory Visit | Attending: Thoracic Surgery (Cardiothoracic Vascular Surgery) | Admitting: Thoracic Surgery (Cardiothoracic Vascular Surgery)

## 2020-04-13 ENCOUNTER — Other Ambulatory Visit: Payer: Self-pay

## 2020-04-13 ENCOUNTER — Ambulatory Visit (INDEPENDENT_AMBULATORY_CARE_PROVIDER_SITE_OTHER): Payer: Self-pay | Admitting: Physician Assistant

## 2020-04-13 ENCOUNTER — Telehealth (HOSPITAL_COMMUNITY): Payer: Self-pay

## 2020-04-13 ENCOUNTER — Other Ambulatory Visit (HOSPITAL_COMMUNITY): Payer: Self-pay | Admitting: *Deleted

## 2020-04-13 VITALS — BP 137/79 | HR 81 | Resp 20 | Ht 70.0 in | Wt 170.0 lb

## 2020-04-13 DIAGNOSIS — N179 Acute kidney failure, unspecified: Secondary | ICD-10-CM | POA: Diagnosis not present

## 2020-04-13 DIAGNOSIS — I10 Essential (primary) hypertension: Secondary | ICD-10-CM | POA: Diagnosis not present

## 2020-04-13 DIAGNOSIS — M1990 Unspecified osteoarthritis, unspecified site: Secondary | ICD-10-CM | POA: Diagnosis not present

## 2020-04-13 DIAGNOSIS — E785 Hyperlipidemia, unspecified: Secondary | ICD-10-CM | POA: Diagnosis not present

## 2020-04-13 DIAGNOSIS — E039 Hypothyroidism, unspecified: Secondary | ICD-10-CM | POA: Diagnosis not present

## 2020-04-13 DIAGNOSIS — Z9889 Other specified postprocedural states: Secondary | ICD-10-CM

## 2020-04-13 DIAGNOSIS — K5792 Diverticulitis of intestine, part unspecified, without perforation or abscess without bleeding: Secondary | ICD-10-CM | POA: Diagnosis not present

## 2020-04-13 DIAGNOSIS — Z48812 Encounter for surgical aftercare following surgery on the circulatory system: Secondary | ICD-10-CM | POA: Diagnosis not present

## 2020-04-13 DIAGNOSIS — I480 Paroxysmal atrial fibrillation: Secondary | ICD-10-CM | POA: Diagnosis not present

## 2020-04-13 DIAGNOSIS — I340 Nonrheumatic mitral (valve) insufficiency: Secondary | ICD-10-CM

## 2020-04-13 DIAGNOSIS — J984 Other disorders of lung: Secondary | ICD-10-CM | POA: Diagnosis not present

## 2020-04-13 DIAGNOSIS — J45909 Unspecified asthma, uncomplicated: Secondary | ICD-10-CM | POA: Diagnosis not present

## 2020-04-13 DIAGNOSIS — G4733 Obstructive sleep apnea (adult) (pediatric): Secondary | ICD-10-CM | POA: Diagnosis not present

## 2020-04-13 DIAGNOSIS — E876 Hypokalemia: Secondary | ICD-10-CM | POA: Diagnosis not present

## 2020-04-13 DIAGNOSIS — I951 Orthostatic hypotension: Secondary | ICD-10-CM | POA: Diagnosis not present

## 2020-04-13 NOTE — Telephone Encounter (Signed)
Pt insurance is active and benefits verified through [facility]. Co-pay $0.00, DED $0.00/$0.00 met, out of pocket $4,500.00/$0.00 met, co-insurance 0%. No pre-authorization required. Passport, 04/13/20 @ 3:25PM, RAQ#[id number]  Will contact patient to see if he is interested in the Cardiac Rehab Program. If interested, patient will need to complete follow up appt. Once completed, patient will be contacted for scheduling upon review by the RN Navigator.

## 2020-04-13 NOTE — Discharge Summary (Addendum)
Physician Discharge Summary  Patient ID: Patrick Bell MRN: [id number] DOB/AGE: Jul 02, 1940 80 y.o.  Admit date: 03/31/2020 Discharge date: 04/07/2020  Discharge Diagnoses:  Principal Problem:   Debility Active Problems:   Hypoalbuminemia due to protein-calorie malnutrition ([place])   AKI (acute kidney injury) ([place])   Hypothyroidism   Acute blood loss anemia   Muscle pain   Peripheral edema   Discharged Condition: stable.   Significant Diagnostic Studies: N/A  Labs:  Basic Metabolic Panel: BMP Latest Ref Rng & Units 04/06/2020 04/03/2020 04/01/2020  Glucose 70 - 99 mg/dL 89 92 97  BUN 8 - 23 mg/dL 23 23 20   Creatinine 0.61 - 1.24 mg/dL 1.52(H) 1.55(H) 1.47(H)  BUN/Creat Ratio 10 - 24 - - -  Sodium 135 - 145 mmol/L 134(L) 135 137  Potassium 3.5 - 5.1 mmol/L 3.8 4.5 4.1  Chloride 98 - 111 mmol/L 102 101 104  CO2 22 - 32 mmol/L 24 24 25   Calcium 8.9 - 10.3 mg/dL 8.5(L) 8.4(L) 8.4(L)    CBC: CBC Latest Ref Rng & Units 04/07/2020 04/06/2020 04/05/2020  WBC 4.0 - 10.5 K/uL 5.4 5.4 5.5  Hemoglobin 13.0 - 17.0 g/dL 8.8(L) 9.0(L) 8.9(L)  Hematocrit 39.0 - 52.0 % 28.4(L) 27.4(L) 28.8(L)  Platelets 150 - 400 K/uL 389 414(H) 333    CBG: No results for input(s): GLUCAP in the last 168 hours.  Brief HPI:   Patrick Bell is a 80 y.o. male with history of HTN severe OSA, MVP with severe regurgitation, mild SOB and non-exertional CP who was admitted on 03/11/20 for MVR and repair of acute type A aortic dissection by Dr. Roxy Manns. Peri-operative course significant for severe coagulopathy and post op had issues with hypotension, fluid overload, anorexia, ABLA as well as onset of A fib with RVR. He was found to have large pericardial effusion requiring subxiphoid window 12/19. Follow up 2D echo showed EF 60-65% with trivial effusion. He converted to NSR on amiodarone and Toprol for rate control. TSH noted to be elevated at 6.5 and low dose synthroid added for supplementation. Intermittent nausea was  resolving with improvement in intake. Therapy was ongoing and patient was noted to be debilitated. CIR was recommended due to functional decline.    Hospital Course: Patrick Bell was admitted to rehab 03/31/2020 for inpatient therapies to consist of PT, ST and OT at least three hours five days a week. Past admission physiatrist, therapy team and rehab RN have worked together to provide customized collaborative inpatient rehab. His blood pressures were monitored on TID basis and lasix was decreased due to AKI. Hypokalemia was monitored ans had resolved. Lasix was discontinued by 01/03 due to persistent elevation in SCr. Weights were monitored daily and showed no signs of fluid overload and low salt diet/TEDs were ordered to manage peripheral edema. Fluid restriciton was discontinued to help with hydration status.   CT sutures removed on 01/02 without difficulty and chest wall pain was improving. Incisions were healing well without s/s of infection.  Follow up CBC revealed that ABLA and platelets to be stable. Pain control improved with scheduled robaxin X 48 hours. This was tapered to prn with use of tramadol prn with improvement in pain control. Pharmacy has been assisting with management of coumadin with dosing. INR was 2.3 and therapeutic on 5 mg daily. He  Had made good progress during his stay and supervision was recommended due to safety and steranl precautions. HHRN to draw protime on 01/07 with results to coumadin clinic. He will  continue to receive follow up HHPT and [facility] after discharge.   Rehab course: During patient's stay in rehab weekly team conferences were held to monitor patient's progress, set goals and discuss barriers to discharge. At admission, patient required min assist with mobility and basic ADLs.  He  has had improvement in activity tolerance, balance, postural control as well as ability to compensate for deficits. He required min assist for shower but was  able to complete ADL tasks with supervision. He required supervision with mobility. Family education completed with wife regarding all aspects of care.   Discharge disposition: 01-Home or Self Care  Diet: Heart healthy- Low salt.   Special Instructions: 1. Will need TSH rechecked in 3-4 weeks to monitor levels/evaluate dosage of synthroid.  2. HHRN to draw protime on 01/07 with results to [facility] coumadin clinic.  3. Elevated BLE and wear TEDs when out of bed.  4. Will need follow up BMET to monitor SCr and CBC for monitoring of ABLA.    Discharge Instructions     Ambulatory referral to Physical Medicine Rehab   Complete by: As directed    Follow-up 1 month debility related to multimedical      Allergies as of 04/07/2020   No Known Allergies      Medication List     TAKE these medications    acetaminophen 325 MG tablet Commonly known as: TYLENOL Take 1-2 tablets (325-650 mg total) by mouth every 4 (four) hours as needed for mild pain. What changed:  medication strength how much to take when to take this reasons to take this Notes to patient: For mild pain. Do not take more than 3000mg  per day   albuterol 108 (90 Base) MCG/ACT inhaler Commonly known as: ProAir HFA Inhale 2 puffs into the lungs every 6 (six) hours as needed for wheezing. What changed: See the new instructions. Notes to patient: For shortness of breath   amiodarone 200 MG tablet Commonly known as: PACERONE Take 1 tablet (200 mg total) by mouth daily. Notes to patient: **NEW** To control heart rate and rhythm   docusate sodium 100 MG capsule Commonly known as: COLACE Take 2 capsules (200 mg total) by mouth daily. Notes to patient: **NEW** Stool softener   ferrous [id number] C-folic acid capsule Commonly known as: TRINSICON / FOLTRIN Take 1 capsule by mouth daily with breakfast. Notes to patient: **NEW** Vitamin supplement   furosemide 20 MG tablet Commonly known as: Lasix Take 1 tablet  (20 mg total) by mouth daily. What changed:  medication strength how much to take Notes to patient: **NEW** Water pill to prevent fluid build up   hydroxypropyl methylcellulose / hypromellose 2.5 % ophthalmic solution Commonly known as: ISOPTO TEARS / GONIOVISC Place 1-2 drops into both eyes 3 (three) times daily as needed (dry/irritated eyes.). Notes to patient: For dry eyes   levothyroxine 25 MCG tablet Commonly known as: SYNTHROID Take 1 tablet (25 mcg total) by mouth daily at 6 (six) AM. Notes to patient: **NEW** Thyroid supplement. Take 30 minutes before other medications with a full glass of water and sitting upright   methocarbamol 500 MG tablet Commonly known as: ROBAXIN Take 1 tablet (500 mg total) by mouth every 8 (eight) hours as needed for muscle spasms. Notes to patient: **NEW** For muscle spasms. May cause drowsiness and dizziness.    metoprolol succinate 25 MG 24 hr tablet Commonly known as: TOPROL-XL Take 1 tablet (25 mg total) by mouth daily. Notes to patient: **  NEW** To lower heart rate   montelukast 10 MG tablet Commonly known as: SINGULAIR Take 1 tablet (10 mg total) daily by mouth. What changed:  how much to take how to take this when to take this additional instructions Notes to patient: **NEW** To improve breathing   multivitamin with minerals Tabs tablet Take 1 tablet by mouth daily. Notes to patient: **NEW** Supplement   pantoprazole 40 MG tablet Commonly known as: PROTONIX Take 1 tablet (40 mg total) daily by mouth. What changed:  how much to take how to take this when to take this additional instructions Notes to patient: To lower stomach acid   Pataday 0.2 % Soln Generic drug: Olopatadine HCl Place 1 drop into both eyes daily as needed (allergies). Notes to patient: For red/itchy eyes   potassium chloride SA 20 MEQ tablet Commonly known as: KLOR-CON Take 1 tablet (20 mEq total) by mouth daily. What changed: when to take this Notes  to patient: **NEW** Potassium supplement   simvastatin 10 MG tablet Commonly known as: ZOCOR Take 1 tablet (10 mg total) by mouth at bedtime. Notes to patient: To lower cholesterol   traMADol 50 MG tablet Commonly known as: ULTRAM Take 1-2 tablets (50-100 mg total) by mouth every 4 (four) hours as needed for moderate pain. Notes to patient: **NEW** For moderate pain. May cause drowsiness, dizziness and constipation. May use over-the-counter docusate, miralax or senna for constipation   warfarin 5 MG tablet Commonly known as: COUMADIN Take as directed. If you are unsure how to take this medication, talk to your nurse or doctor. Original instructions: Take 1 tablet (5 mg total) by mouth daily at 4 PM. What changed:  medication strength how much to take Notes to patient: **NEW** To prevent clot        Follow-up Information     Troy Sine, MD Follow up.   Specialty: Cardiology Why: Call for appointment Contact information: [address] [place] [address] [phone]         Rexene Alberts, MD Follow up.   Specialty: Cardiothoracic Surgery Why: Call for appointment Contact information: [address] [place] [address] [phone]         Jamse Arn, MD Follow up.   Specialty: Physical Medicine and Rehabilitation Why: Office to call for appointment Contact information: [place] [address] [phone]                 Signed: Bary Leriche 04/13/2020, 10:15 AM Patient was seen, face-face, and physical exam performed by me on day of discharge, greater than 30 minutes of total time spent.. Please see progress note from day of discharge as well.  Delice Lesch, MD, ABPMR

## 2020-04-13 NOTE — Progress Notes (Signed)
[address]       [place] [address]             [phone]        Patrick Bell is a 80 y.o. male patient presents today for his routine follow-up appointment.  The patient states that he has had a 10 to 15 pound unintentional weight loss.  His appetite is decreased and he becomes full more quickly.  He also notes some fatigue and weakness.  1. S/P aortic dissection repair   2. S/P mitral valve repair    Past Medical History:  Diagnosis Date  . Acute thoracic aortic dissection (HCC) 03/11/2020   Type A  . Allergy   . Asthma   . Cataract   . Cataract   . Diverticulitis   . Dyspnea   . GERD (gastroesophageal reflux disease)   . Heart murmur 12/2019  . Hyperlipidemia   . Hypertension   . Inguinal hernia bilateral, non-recurrent   . S/P aortic dissection repair 03/11/2020   supracoronary straight graft repair with resuspension of native aortic valve and open distal anastomosis for intraoperative acute type A aortic dissection   . S/P mitral valve repair 03/11/2020   Complex valvuloplasty including artificial Gore-tex neochord placement x 4, suture plication of posterior leaflet and posteromedial commissure and 32 mm Sorin Memo 4D ring annuloplasty  . Sleep apnea    No past surgical history pertinent negatives on file. Scheduled Meds: Current Outpatient Medications on File Prior to Visit  Medication Sig Dispense Refill  . acetaminophen (TYLENOL) 325 MG tablet Take 1-2 tablets (325-650 mg total) by mouth every 4 (four) hours as needed for mild pain.    Marland Kitchen albuterol (PROAIR HFA) 108 (90 Base) MCG/ACT inhaler Inhale 2 puffs into the lungs every 6 (six) hours as needed for wheezing. 8.5 g 2  . amiodarone (PACERONE) 200 MG tablet Take 1 tablet (200 mg total) by mouth daily. 30 tablet 1  . docusate sodium (COLACE) 100 MG capsule Take 2 capsules (200 mg total) by mouth daily. 10 capsule 0  . furosemide (LASIX) 20 MG tablet Take 1 tablet (20 mg total) by mouth  daily. 30 tablet 11  . hydroxypropyl methylcellulose / hypromellose (ISOPTO TEARS / GONIOVISC) 2.5 % ophthalmic solution Place 1-2 drops into both eyes 3 (three) times daily as needed (dry/irritated eyes.).    Marland Kitchen levothyroxine (SYNTHROID) 25 MCG tablet Take 1 tablet (25 mcg total) by mouth daily at 6 (six) AM. 30 tablet 1  . methocarbamol (ROBAXIN) 500 MG tablet Take 1 tablet (500 mg total) by mouth every 8 (eight) hours as needed for muscle spasms. 30 tablet 0  . metoprolol succinate (TOPROL-XL) 25 MG 24 hr tablet Take 1 tablet (25 mg total) by mouth daily. 30 tablet 1  . montelukast (SINGULAIR) 10 MG tablet Take 1 tablet (10 mg total) daily by mouth. 90 tablet 3  . Multiple Vitamin (MULTIVITAMIN WITH MINERALS) TABS tablet Take 1 tablet by mouth daily.    . Olopatadine HCl 0.2 % SOLN Place 1 drop into both eyes daily as needed (allergies).     . pantoprazole (PROTONIX) 40 MG tablet Take 1 tablet (40 mg total) daily by mouth. 90 tablet 3  . potassium chloride SA (KLOR-CON) 20 MEQ tablet Take 1 tablet (20 mEq total) by mouth daily. 30 tablet 0  . simvastatin (ZOCOR) 10 MG tablet Take 1 tablet (10 mg total) by mouth at bedtime. 90 tablet 3  .  traMADol (ULTRAM) 50 MG tablet Take 1-2 tablets (50-100 mg total) by mouth every 4 (four) hours as needed for moderate pain. 30 tablet 0  . warfarin (COUMADIN) 5 MG tablet Take 1 tablet (5 mg total) by mouth daily at 4 PM. 30 tablet 0  . ferrous [id number] C-folic acid (TRINSICON / FOLTRIN) capsule Take 1 capsule by mouth daily with breakfast. (Patient not taking: Reported on 04/13/2020) 30 capsule 0   No current facility-administered medications on file prior to visit.    No Known Allergies Active Problems:   * No active hospital problems. *  Blood pressure 137/79, pulse 81, resp. rate 20, height 5\' 10"  (1.778 m), weight 170 lb (77.1 kg), SpO2 95 %.  Subjective  The patient presents today for his routine follow-up appointment.  The patient states  that he has had a 10 to 15 pound unintentional weight loss.  His appetite is decreased and he becomes full more quickly.  He also notes some fatigue and weakness.    Objective     Cor: NSR, no mumur Pulm: CTA bilaterally and in all fields Abd: no tenderness Wound: well healed Ext: no edema  CLINICAL DATA:  Status post aortic dissection. Status post mitral valve repair.  EXAM: CHEST - 2 VIEW  COMPARISON:  March 29, 2020.  FINDINGS: Stable cardiomediastinal silhouette. No pneumothorax is noted. Stable scarring is noted laterally in the right midlung. Left basilar atelectasis or scarring is noted with probable associated pleural thickening or small effusion. Bony thorax is unremarkable.  IMPRESSION: Stable scarring seen laterally in the right midlung. Left basilar atelectasis or scarring is noted with probable associated pleural thickening or small effusion.   Electronically Signed   By: Marijo Conception M.D.   On: 04/13/2020 12:54    Assessment & Plan   1.  Unintentional 10 to 15 pound weight loss since admission-the patient has been eating 3 meals a day but notes that his portion sizes are significantly smaller.  He does not have much energy throughout the day and is sleeping for several hours.  He notes that his activity level has decreased and he is extremely fatigued.  I recommended increasing his caloric intake and eating smaller meals throughout the day.  We talked about supplementation with the protein supplements such as boost or Ensure to increase his calories and protein.  I also believe that his thyroid might be contributing.  He has not had a thyroid panel since 03/22/2020.  I would recommend that he gets a thyroid panel in addition to CBC and BMP through his primary care office and they can evaluate at that time.  I discontinued his Lasix and potassium supplementation since he does not have any edema.  He is encouraged to keep track of his weight daily  and update our office with any changes with the increase in calories.  2. Weakness/fatigue-there is noticeable muscle loss in the patient's lower extremity.  This could be due to his inactivity and extreme sleepiness throughout the day.  I encouraged patient to walk several times a day and also increase his caloric intake.  This will help him not only feels stronger but also maintain muscle mass.  3. supratherapeutic INR-he was told to discontinue his Coumadin throughout the weekend and has not had a dose since Thursday.  He does have an INR draw scheduled for today and home health is going to send the results to his primary care for titration.  4. wound is healing well without any  issues.  Does have a small amount of swelling around his minithoracotomy incision but no signs of infection.  5. the patient states that he has not taken his losartan since the hospital however his blood pressure is stable today so I do not think it is indicated at this time.  I suggest that he follows up with his primary care this month for labs including thyroid panel.  I think this is extremely important with his unintentional 15 pound weight loss.  He also has a follow-up appointment with cardiology.  I have suggested that he follows up with our practice at the end of the month to see that these things are addressed.  He will follow-up with Dr. Patrice Paradise at the 64-month mark.  He was given both of these appointment times and dates today.  Nicholes Rough, PA-C  Patrick Bell 04/13/2020

## 2020-04-14 ENCOUNTER — Telehealth: Payer: Self-pay

## 2020-04-14 NOTE — Telephone Encounter (Signed)
Spoke with daughter to schedule appointment.  Daughter told me that patient's coumadin level was checked last night and was 2.2.  Needs advice on Coumadin dosing and needs to know when to recheck.  Looked in patient's chart and we have not received this information from home health nurse.  I called Arvil Chaco with [facility] at [phone] and left her a message to contact me with protime results.  Did not schedule hospital follow up yet, waiting on call back from home health.

## 2020-04-14 NOTE — Telephone Encounter (Signed)
Documented and sent on previous telephone encounter

## 2020-04-14 NOTE — Telephone Encounter (Signed)
INR therapeutic.  Recheck in 1 week.

## 2020-04-14 NOTE — Telephone Encounter (Signed)
Call patient's daughter, Brainard Highfill, with instruction [phone] and make hospital follow up appointment.  Per Arvil Chaco at [facility], patient's protime was 2.2.  Please advise dosing.

## 2020-04-15 ENCOUNTER — Telehealth: Payer: Self-pay | Admitting: Family Medicine

## 2020-04-15 DIAGNOSIS — I10 Essential (primary) hypertension: Secondary | ICD-10-CM | POA: Diagnosis not present

## 2020-04-15 DIAGNOSIS — E876 Hypokalemia: Secondary | ICD-10-CM | POA: Diagnosis not present

## 2020-04-15 DIAGNOSIS — I480 Paroxysmal atrial fibrillation: Secondary | ICD-10-CM | POA: Diagnosis not present

## 2020-04-15 DIAGNOSIS — E039 Hypothyroidism, unspecified: Secondary | ICD-10-CM | POA: Diagnosis not present

## 2020-04-15 DIAGNOSIS — K5792 Diverticulitis of intestine, part unspecified, without perforation or abscess without bleeding: Secondary | ICD-10-CM | POA: Diagnosis not present

## 2020-04-15 DIAGNOSIS — G4733 Obstructive sleep apnea (adult) (pediatric): Secondary | ICD-10-CM | POA: Diagnosis not present

## 2020-04-15 DIAGNOSIS — Z48812 Encounter for surgical aftercare following surgery on the circulatory system: Secondary | ICD-10-CM | POA: Diagnosis not present

## 2020-04-15 DIAGNOSIS — E785 Hyperlipidemia, unspecified: Secondary | ICD-10-CM | POA: Diagnosis not present

## 2020-04-15 DIAGNOSIS — M1990 Unspecified osteoarthritis, unspecified site: Secondary | ICD-10-CM | POA: Diagnosis not present

## 2020-04-15 DIAGNOSIS — N179 Acute kidney failure, unspecified: Secondary | ICD-10-CM | POA: Diagnosis not present

## 2020-04-15 DIAGNOSIS — J45909 Unspecified asthma, uncomplicated: Secondary | ICD-10-CM | POA: Diagnosis not present

## 2020-04-15 DIAGNOSIS — I951 Orthostatic hypotension: Secondary | ICD-10-CM | POA: Diagnosis not present

## 2020-04-15 NOTE — Telephone Encounter (Signed)
A previous telephone call was already made on this patient and we are awaiting provider results.

## 2020-04-15 NOTE — Telephone Encounter (Signed)
Patient aware and states he has not been taking his comedian since 04/09/20.

## 2020-04-15 NOTE — Telephone Encounter (Signed)
Pt has an apt 05/10/2020 for HOSP follow but pt needs to come in sooner due to high INR and pcp says to come in ASAP. PCP is booked--Who else can he see? Please call back

## 2020-04-15 NOTE — Telephone Encounter (Signed)
Please have him make an appointment ASAP so we can see him for hospital follow-up and then we can order those labs.

## 2020-04-15 NOTE — Telephone Encounter (Signed)
Calling about pt's INR and wanting to know if they need to start taking coumadin again

## 2020-04-15 NOTE — Telephone Encounter (Signed)
Patient is needing his coumadin and thyroid checked.  [place] is going out tomorrow.  Can this be okayed for them to do?  Please call Arbie Cookey at [phone]

## 2020-04-15 NOTE — Telephone Encounter (Signed)
Patient states that he needs lab work repeated per cardiology.  Please review last office note.  Patient wanted to know if Dr. Warrick Parisian would order these labs for him ?

## 2020-04-15 NOTE — Telephone Encounter (Signed)
Attempted to contact patient - NA °

## 2020-04-16 ENCOUNTER — Telehealth: Payer: Self-pay | Admitting: *Deleted

## 2020-04-16 ENCOUNTER — Ambulatory Visit: Payer: Medicare Other

## 2020-04-16 ENCOUNTER — Other Ambulatory Visit: Payer: Medicare Other

## 2020-04-16 DIAGNOSIS — Z48812 Encounter for surgical aftercare following surgery on the circulatory system: Secondary | ICD-10-CM | POA: Diagnosis not present

## 2020-04-16 DIAGNOSIS — E785 Hyperlipidemia, unspecified: Secondary | ICD-10-CM | POA: Diagnosis not present

## 2020-04-16 DIAGNOSIS — K5792 Diverticulitis of intestine, part unspecified, without perforation or abscess without bleeding: Secondary | ICD-10-CM | POA: Diagnosis not present

## 2020-04-16 DIAGNOSIS — N179 Acute kidney failure, unspecified: Secondary | ICD-10-CM | POA: Diagnosis not present

## 2020-04-16 DIAGNOSIS — I480 Paroxysmal atrial fibrillation: Secondary | ICD-10-CM | POA: Diagnosis not present

## 2020-04-16 DIAGNOSIS — E039 Hypothyroidism, unspecified: Secondary | ICD-10-CM | POA: Diagnosis not present

## 2020-04-16 DIAGNOSIS — I951 Orthostatic hypotension: Secondary | ICD-10-CM | POA: Diagnosis not present

## 2020-04-16 DIAGNOSIS — E876 Hypokalemia: Secondary | ICD-10-CM | POA: Diagnosis not present

## 2020-04-16 DIAGNOSIS — G4733 Obstructive sleep apnea (adult) (pediatric): Secondary | ICD-10-CM | POA: Diagnosis not present

## 2020-04-16 DIAGNOSIS — M1990 Unspecified osteoarthritis, unspecified site: Secondary | ICD-10-CM | POA: Diagnosis not present

## 2020-04-16 DIAGNOSIS — I10 Essential (primary) hypertension: Secondary | ICD-10-CM | POA: Diagnosis not present

## 2020-04-16 DIAGNOSIS — J45909 Unspecified asthma, uncomplicated: Secondary | ICD-10-CM | POA: Diagnosis not present

## 2020-04-16 NOTE — Telephone Encounter (Signed)
VM from Whitney w/ [facility] INR 1.5 PT 17.4 today  Please return call to Whitney at [phone] with coumadin instructions & when next INR

## 2020-04-16 NOTE — Telephone Encounter (Signed)
Patient states that he is currently not taking any -  last dose was 1 week ago today

## 2020-04-16 NOTE — Telephone Encounter (Signed)
Lmtcb.

## 2020-04-16 NOTE — Telephone Encounter (Signed)
Arbie Cookey returned missed call. Made her aware that Dr Dettinger gave confirmation to check pt's thyroid level and INR. Arbie Cookey voiced understanding.

## 2020-04-16 NOTE — Telephone Encounter (Signed)
Refer to previous phone note 

## 2020-04-16 NOTE — Telephone Encounter (Signed)
Description   INR 1.5 Goal 2.0-3.0   2.5 mg (1/2 tab) on Tuesday and Saturday. 5 mg (1 tab) all the other days.   I think that he was previously taking 2.5 mg three times a week so verified that told him that if that is true then this will be his new dose.  Recheck in 1 week       Caryl Pina, MD [facility] 04/16/2020, 5:00 PM

## 2020-04-16 NOTE — Telephone Encounter (Signed)
Yes go ahead and give them order to do INR

## 2020-04-16 NOTE — Telephone Encounter (Signed)
Is he supposed to still be taking it or did cardiology take him off of it, that is a question that we should know, if they do not want him on it any further than there is no point in repeating INRs but if they want him to restarted at some point then we should repeat INRs after that.  Let me know whether or not he is supposed to still be on it.

## 2020-04-17 NOTE — Telephone Encounter (Signed)
Left message for Whitney with [facility]. Asked her to call the office back to confirm if pt is still taking blood thinner. Instructions given if he is still taking.

## 2020-04-17 NOTE — Telephone Encounter (Signed)
Pt aware to take 2.5 mg of coumadin on Tues & Sat, then 5 mg on all other days, I will call Whitney to do his INR in a week. LMOVM for Whitney with the above detailed instructions again on pt's coumadin dosage and recheck in one week.

## 2020-04-18 NOTE — Progress Notes (Signed)
See note from [facility] 04/21/2020.

## 2020-04-20 ENCOUNTER — Ambulatory Visit: Payer: Medicare Other | Admitting: Physician Assistant

## 2020-04-21 ENCOUNTER — Telehealth (INDEPENDENT_AMBULATORY_CARE_PROVIDER_SITE_OTHER): Payer: Medicare Other | Admitting: Cardiology

## 2020-04-21 VITALS — BP 128/74 | HR 78 | Ht 70.0 in | Wt 170.6 lb

## 2020-04-21 DIAGNOSIS — Z79899 Other long term (current) drug therapy: Secondary | ICD-10-CM | POA: Diagnosis not present

## 2020-04-21 DIAGNOSIS — Z9889 Other specified postprocedural states: Secondary | ICD-10-CM | POA: Diagnosis not present

## 2020-04-21 DIAGNOSIS — N179 Acute kidney failure, unspecified: Secondary | ICD-10-CM | POA: Diagnosis not present

## 2020-04-21 DIAGNOSIS — E785 Hyperlipidemia, unspecified: Secondary | ICD-10-CM | POA: Diagnosis not present

## 2020-04-21 DIAGNOSIS — D699 Hemorrhagic condition, unspecified: Secondary | ICD-10-CM | POA: Diagnosis not present

## 2020-04-21 DIAGNOSIS — J45909 Unspecified asthma, uncomplicated: Secondary | ICD-10-CM | POA: Diagnosis not present

## 2020-04-21 DIAGNOSIS — R5383 Other fatigue: Secondary | ICD-10-CM | POA: Diagnosis not present

## 2020-04-21 DIAGNOSIS — I10 Essential (primary) hypertension: Secondary | ICD-10-CM | POA: Diagnosis not present

## 2020-04-21 DIAGNOSIS — G4733 Obstructive sleep apnea (adult) (pediatric): Secondary | ICD-10-CM | POA: Diagnosis not present

## 2020-04-21 DIAGNOSIS — K5792 Diverticulitis of intestine, part unspecified, without perforation or abscess without bleeding: Secondary | ICD-10-CM | POA: Diagnosis not present

## 2020-04-21 DIAGNOSIS — M1990 Unspecified osteoarthritis, unspecified site: Secondary | ICD-10-CM | POA: Diagnosis not present

## 2020-04-21 DIAGNOSIS — D649 Anemia, unspecified: Secondary | ICD-10-CM | POA: Diagnosis not present

## 2020-04-21 DIAGNOSIS — E876 Hypokalemia: Secondary | ICD-10-CM | POA: Diagnosis not present

## 2020-04-21 DIAGNOSIS — E039 Hypothyroidism, unspecified: Secondary | ICD-10-CM | POA: Diagnosis not present

## 2020-04-21 DIAGNOSIS — Z48812 Encounter for surgical aftercare following surgery on the circulatory system: Secondary | ICD-10-CM | POA: Diagnosis not present

## 2020-04-21 DIAGNOSIS — I951 Orthostatic hypotension: Secondary | ICD-10-CM | POA: Diagnosis not present

## 2020-04-21 DIAGNOSIS — I480 Paroxysmal atrial fibrillation: Secondary | ICD-10-CM | POA: Diagnosis not present

## 2020-04-21 NOTE — Patient Instructions (Signed)
Medication Instructions:  Continue current medications  *If you need a refill on your cardiac medications before your next appointment, please call your pharmacy*   Lab Work: CBC, TSH, BMP and INR  If you have labs (blood work) drawn today and your tests are completely normal, you will receive your results only by: Marland Kitchen MyChart Message (if you have MyChart) OR . A paper copy in the mail If you have any lab test that is abnormal or we need to change your treatment, we will call you to review the results.   Testing/Procedures: None Ordered   Follow-Up: At [facility], you and your health needs are our priority.  As part of our continuing mission to provide you with exceptional heart care, we have created designated Provider Care Teams.  These Care Teams include your primary Cardiologist (physician) and Advanced Practice Providers (APPs -  Physician Assistants and Nurse Practitioners) who all work together to provide you with the care you need, when you need it.  We recommend signing up for the patient portal called "MyChart".  Sign up information is provided on this After Visit Summary.  MyChart is used to connect with patients for Virtual Visits (Telemedicine).  Patients are able to view lab/test results, encounter notes, upcoming appointments, etc.  Non-urgent messages can be sent to your provider as well.   To learn more about what you can do with MyChart, go to [email or web address].    Your next appointment:   Keep appointment January 24th  The format for your next appointment:   In person  Provider:   [name], PA-C

## 2020-04-21 NOTE — Progress Notes (Signed)
Virtual Visit via Telephone Note   This visit type was conducted due to national recommendations for restrictions regarding the COVID-19 Pandemic (e.g. social distancing) in an effort to limit this patient's exposure and mitigate transmission in our community.  Due to his co-morbid illnesses, this patient is at least at moderate risk for complications without adequate follow up.  This format is felt to be most appropriate for this patient at this time.  The patient did not have access to video technology/had technical difficulties with video requiring transitioning to audio format only (telephone).  All issues noted in this document were discussed and addressed.  No physical exam could be performed with this format.  Please refer to the patient's chart for his  consent to telehealth for [facility].    Date:  04/21/2020   ID:  Patrick Bell, DOB Jun 03, 1940, MRN [id number] The patient was identified using 2 identifiers.  Patient Location: Home Provider Location: Home Office  PCP:  Dettinger, Fransisca Kaufmann, MD  Cardiologist:  Shelva Majestic, MD  Electrophysiologist:  None   Evaluation Performed:  Follow-Up Visit  Chief Complaint:  fatigue  History of Present Illness:    Patrick Bell is a 80 y.o. male with with a history sleep apnea and HTN.  He saw Dr Claiborne Billings as an OP in Sept 2021 for heart murmur an dyspnea. Echo revealed severe MR.  Further work up revealed normal coronaries on cardiac cath in 02/2020 and severe MR. The patient underwent elective MVR with ring annuloplasty on 03/11/2020.  The procedure was complicated by an acute type A thoracic aortic dissection during the procedure requiring graft repair of his aorta and resuspension of native aortic valve. His hospital course was further complicated by pericardial effusion requiring pericardial window, atrial fibrillation treated with Amiodarone and Coumadin, anemia requiring transfusion, AKI,  hyportension, and a new diagnosis of  hypothyroidism. After his pericardial window, he converted back to sinus rhythm.   The patient was discharged to CIR on 03/31/2020 on Amiodarone 200mg  daily, Lasix 40mg  daily, Toprol-XL 25mg  daily, Simvastatin 10mg  daily, Synthroid 25mg  daily, and Coumadin. Home Losartan was discontinued. His time in CIR was relatively uneventful. Lasix was discontinued due to worsening renal function. He was discharged from [facility] on 04/07/2020.  He was seen for follow-up by CT Surgery on 04/13/2020 at which time he reported 10-15 lb unintentional weight loss as well as decreased appetite and fatigue/weakness. He was encouraged to increase caloric intake by eating smaller meals throughout the day and trying protein supplements. There was concern that his hypothyroidism may be playing a role and he was encouraged to follow-up with his PCP.  The patient was contacted today by phone for follow-up.  He says labs were drawn by his PCP but he has been unable to get the results.  He and his wife were quite anxious about these results and the fact that they have been unable to communicate with their PCP.  He tells me he feels fatigued all the time, he falls asleep frequently during the day.  The patient does not have symptoms concerning for COVID-19 infection (fever, chills, cough, or new shortness of breath).    Past Medical History:  Diagnosis Date  . Acute thoracic aortic dissection (HCC) 03/11/2020   Type A  . Allergy   . Asthma   . Cataract   . Cataract   . Diverticulitis   . Dyspnea   . GERD (gastroesophageal reflux disease)   . Heart murmur 12/2019  .  Hyperlipidemia   . Hypertension   . Inguinal hernia bilateral, non-recurrent   . S/P aortic dissection repair 03/11/2020   supracoronary straight graft repair with resuspension of native aortic valve and open distal anastomosis for intraoperative acute type A aortic dissection   . S/P mitral valve repair 03/11/2020   Complex valvuloplasty including artificial  Gore-tex neochord placement x 4, suture plication of posterior leaflet and posteromedial commissure and 32 mm Sorin Memo 4D ring annuloplasty  . Sleep apnea    Past Surgical History:  Procedure Laterality Date  . bilateral inguinal hernia    . CARDIAC CATHETERIZATION Bilateral 02/21/2020  . CHEST TUBE INSERTION N/A 03/22/2020   Procedure: CHEST TUBE INSERTION OF 28 BLAKE DRAIN.;  Surgeon: Grace Isaac, MD;  Location: [facility] OR;  Service: Thoracic;  Laterality: N/A;  . COLONOSCOPY  06/09/2004   [id number] colon diverticulosis, otherwise normal colonoscopy  . EYE SURGERY  08/2014  . HERNIA REPAIR    . MITRAL VALVE REPAIR N/A 03/11/2020   Procedure: MITRAL VALVE REPAIR (MVR) USING MEMO 4D SIZE 32 RING;  Surgeon: Rexene Alberts, MD;  Location: [place];  Service: Open Heart Surgery;  Laterality: N/A;  . PALATE / UVULA BIOPSY / EXCISION    . PERICARDIAL WINDOW N/A 03/22/2020   Procedure: SUBXYPHOID POST-OP PERICARDIAL FLUID DRAINAGE WITH CHEST TUBE INSERTION.;  Surgeon: Grace Isaac, MD;  Location: [place];  Service: Thoracic;  Laterality: N/A;  . REPAIR OF ACUTE ASCENDING THORACIC AORTIC DISSECTION N/A 03/11/2020   Procedure: REPAIR OF ACUTE ASCENDING THORACIC AORTIC DISSECTION USING A HEMASHIELD PLATINUM 26MM STRAIGHT GRAFT AND A HEASHIELD PLATINUM 28X10MM SINGLE ARM GRAFT;  Surgeon: Rexene Alberts, MD;  Location: [place];  Service: Open Heart Surgery;  Laterality: N/A;  . RIGHT/LEFT HEART CATH AND CORONARY ANGIOGRAPHY N/A 02/21/2020   Procedure: RIGHT/LEFT HEART CATH AND CORONARY ANGIOGRAPHY;  Surgeon: Troy Sine, MD;  Location: [place] CV [facility];  Service: Cardiovascular;  Laterality: N/A;  . TEE WITHOUT CARDIOVERSION N/A 12/19/2019   Procedure: TRANSESOPHAGEAL ECHOCARDIOGRAM (TEE);  Surgeon: Buford Dresser, MD;  Location[facility] ENDOSCOPY;  Service: Cardiovascular;  Laterality: N/A;  . TEE WITHOUT CARDIOVERSION N/A 03/11/2020   Procedure: TRANSESOPHAGEAL ECHOCARDIOGRAM (TEE);   Surgeon: Rexene Alberts, MD;  Location: [place];  Service: Open Heart Surgery;  Laterality: N/A;  . TEE WITHOUT CARDIOVERSION N/A 03/22/2020   Procedure: TRANSESOPHAGEAL ECHOCARDIOGRAM (TEE);  Surgeon: Grace Isaac, MD;  Location: [facility] OR;  Service: Thoracic;  Laterality: N/A;  . TONSILLECTOMY    . VASECTOMY       Current Meds  Medication Sig  . acetaminophen (TYLENOL) 325 MG tablet Take 1-2 tablets (325-650 mg total) by mouth every 4 (four) hours as needed for mild pain.  Marland Kitchen albuterol (PROAIR HFA) 108 (90 Base) MCG/ACT inhaler Inhale 2 puffs into the lungs every 6 (six) hours as needed for wheezing.  Marland Kitchen amiodarone (PACERONE) 200 MG tablet Take 1 tablet (200 mg total) by mouth daily.  Marland Kitchen docusate sodium (COLACE) 100 MG capsule Take 2 capsules (200 mg total) by mouth daily.  . hydroxypropyl methylcellulose / hypromellose (ISOPTO TEARS / GONIOVISC) 2.5 % ophthalmic solution Place 1-2 drops into both eyes 3 (three) times daily as needed (dry/irritated eyes.).  Marland Kitchen levothyroxine (SYNTHROID) 25 MCG tablet Take 1 tablet (25 mcg total) by mouth daily at 6 (six) AM.  . metoprolol succinate (TOPROL-XL) 25 MG 24 hr tablet Take 1 tablet (25 mg total) by mouth daily.  . montelukast (SINGULAIR) 10 MG tablet  Take 1 tablet (10 mg total) daily by mouth.  . Multiple Vitamin (MULTIVITAMIN WITH MINERALS) TABS tablet Take 1 tablet by mouth daily.  . Olopatadine HCl 0.2 % SOLN Place 1 drop into both eyes daily as needed (allergies).  . pantoprazole (PROTONIX) 40 MG tablet Take 1 tablet (40 mg total) daily by mouth.  . simvastatin (ZOCOR) 10 MG tablet Take 1 tablet (10 mg total) by mouth at bedtime.  Marland Kitchen warfarin (COUMADIN) 5 MG tablet Take 1 tablet (5 mg total) by mouth daily at 4 PM.  . [DISCONTINUED] furosemide (LASIX) 20 MG tablet Take 1 tablet (20 mg total) by mouth daily.     Allergies:   Patient has no known allergies.   Social History   Tobacco Use  . Smoking status: Former Smoker    Packs/day: 4.00     Years: 25.00    Pack years: 100.00    Types: Cigarettes    Quit date: 06/23/1982    Years since quitting: 37.8  . Smokeless tobacco: Never Used  . 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 Vaping Use: Never used  Substance Use Topics  . Alcohol use: Yes    Alcohol/week: 5.0 standard drinks    Types: 5 Shots of liquor per week    Comment: brandy a few times a month, beer once a month  . Drug use: No     Family Hx: The patient's family history includes Arthritis in his brother, father, and sister; Dementia (age of onset: 50) in his mother; Heart disease in his father and mother; Hip fracture (age of onset: 60) in his mother; Osteoporosis in his mother. There is no history of Colon cancer.  ROS:   Please see the history of present illness.    All other systems reviewed and are negative.   Prior CV studies:   The following studies were reviewed today:  Echo 03/28/2020- IMPRESSIONS    1. Limited echo for pericardial effusion. A trivial effusion is now  present. NSR is now present.  2. Left ventricular ejection fraction, by estimation, is 60 to 65%. The  left ventricle has normal function.  3. Right ventricular systolic function is normal. The right ventricular  size is normal.  4. The mitral valve has been repaired/replaced. No evidence of mitral  valve regurgitation. The mean mitral valve gradient is 5.0 mmHg with  average heart rate of 76 bpm. There is a 32 mm prosthetic annuloplasty  ring present in the mitral position.  Procedure Date: 03/11/2020.  5. The aortic valve is grossly normal.  6. The inferior vena cava is dilated in size with >50% respiratory  variability, suggesting right atrial pressure of 8 mmHg.   Comparison(s): Changes from prior study are noted. Effusion is now  trivial.   Labs/Other Tests and Data Reviewed:    EKG:  An ECG dated 04/07/2020 was personally reviewed today and demonstrated:  NSR, HR 72, inferior Qs  Recent  Labs: 03/22/2020: TSH 6.518 04/01/2020: ALT 21; Magnesium 2.0 04/06/2020: BUN 23; Creatinine, Ser 1.52; Potassium 3.8; Sodium 134 04/07/2020: Hemoglobin 8.8; Platelets 389   Recent Lipid Panel Lab Results  Component Value Date/Time   CHOL 184 11/14/2019 10:04 AM   CHOL 168 06/18/2012 10:31 AM   TRIG 87 11/14/2019 10:04 AM   TRIG 101 01/14/2013 12:18 PM   TRIG 102 06/18/2012 10:31 AM   HDL 70 11/14/2019 10:04 AM   HDL 65 01/14/2013 12:18 PM   HDL 62 06/18/2012 10:31 AM  CHOLHDL 2.6 11/14/2019 10:04 AM   LDLCALC 98 11/14/2019 10:04 AM   LDLCALC 99 01/14/2013 12:18 PM   LDLCALC 86 06/18/2012 10:31 AM    Wt Readings from Last 3 Encounters:  04/21/20 170 lb 9.6 oz (77.4 kg)  04/13/20 170 lb (77.1 kg)  04/07/20 172 lb 2.9 oz (78.1 kg)     Risk Assessment/Calculations:       :[id number]}  CHA2DS2-VASc Score =   2 This indicates a  % annual risk of stroke. The patient's score is based upon: age, HTN       Objective:    Vital Signs:  BP 128/74   Pulse 78   Ht 5\' 10"  (1.778 m)   Wt 170 lb 9.6 oz (77.4 kg)   BMI 24.48 kg/m    VITAL SIGNS:  reviewed  ASSESSMENT & PLAN:    1. Severe MR s/p MVR complicated by acute Type A dissection s/p repair and pericardial effusion. Overall he sound stable from this standpoint   2. Post op Afib: not monitored on telemetry, but in SR. Felt to be related to pericardial effusion.  -- amiodarone 200mg  daily, Lopressor 25mg  daily -- coumadin per PCP but he would like to change to [facility]  3. S/p pericardial effusion: per Dr. Servando Snare. Limited follow up echo 12/25 with trivial effusion  4. HTN: Controlled -- continue Toprol 25mg  daily  5. HLD: on Zocor 10mg  daily  6. AKI: Cr stable at 1.52 on 04/06/2020  8. Anemia: Hgb stable at 8.8 on 04/07/2020  Plan: The patient asked that we take over his care.  I am unable to remotely access labs done at [facility] family practice.  He and his wife are quite anxious about these test.  I  suggested we repeat them tomorrow, he has a follow-up with me next week.        COVID-19 Education: The signs and symptoms of COVID-19 were discussed with the patient and how to seek care for testing (follow up with PCP or arrange E-visit).  The importance of social distancing was discussed today.  Time:   Today, I have spent 20 minutes with the patient with telehealth technology discussing the above problems.     Medication Adjustments/Labs and Tests Ordered: Current medicines are reviewed at length with the patient today.  Concerns regarding medicines are outlined above.   Tests Ordered: Orders Placed This Encounter  Procedures  . Basic Metabolic Panel (BMET)  . CBC  . TSH  . INR/PT    Medication Changes: No orders of the defined types were placed in this encounter.   Follow Up:  In Person as scheduled next week  Signed, [name], Hershal Coria  04/21/2020 3:43 PM    [facility]

## 2020-04-22 ENCOUNTER — Ambulatory Visit (INDEPENDENT_AMBULATORY_CARE_PROVIDER_SITE_OTHER): Payer: Medicare Other

## 2020-04-22 ENCOUNTER — Telehealth: Payer: Self-pay

## 2020-04-22 ENCOUNTER — Other Ambulatory Visit: Payer: Self-pay

## 2020-04-22 DIAGNOSIS — I7101 Dissection of thoracic aorta: Secondary | ICD-10-CM | POA: Diagnosis not present

## 2020-04-22 DIAGNOSIS — Z5181 Encounter for therapeutic drug level monitoring: Secondary | ICD-10-CM | POA: Diagnosis not present

## 2020-04-22 DIAGNOSIS — Z9889 Other specified postprocedural states: Secondary | ICD-10-CM | POA: Diagnosis not present

## 2020-04-22 DIAGNOSIS — I71019 Dissection of thoracic aorta, unspecified: Secondary | ICD-10-CM

## 2020-04-22 LAB — POCT INR: INR: 1.7 — AB (ref 2.0–3.0)

## 2020-04-22 MED ORDER — LEVOTHYROXINE SODIUM 25 MCG PO TABS: 25.0000 ug | ORAL_TABLET | Freq: Every day | ORAL | 1 refills | Status: DC

## 2020-04-22 NOTE — Patient Instructions (Signed)
Take 2 tablets tonight and then continue 2.5 mg (1/2 tab) on Tuesday and Saturday. 5 mg (1 tab) all the other days.  Recheck in 1 week   A full discussion of the nature of anticoagulants has been carried out.  A benefit risk analysis has been presented to the patient, so that they understand the justification for choosing anticoagulation at this time. The need for frequent and regular monitoring, precise dosage adjustment and compliance is stressed.  Side effects of potential bleeding are discussed.  The patient should avoid any OTC items containing aspirin or ibuprofen, and should avoid great swings in general diet.  Avoid alcohol consumption.  Call if any signs of abnormal bleeding.  [phone]

## 2020-04-22 NOTE — Telephone Encounter (Signed)
TC to patient with TSH results that were drawn by [facility], per Dr. Warrick Parisian labwork normal continue on same dose, refill sent for 6 mos. He is also saying he is still so very tired, slept for 9 hours after visit with doctor yesterday, explained to him that it may take up to 6 months to recover from his surgery and for him to go ahead and take the OTC iron. He will be seeing Dr. Warrick Parisian on 05/20/20

## 2020-04-23 ENCOUNTER — Ambulatory Visit (INDEPENDENT_AMBULATORY_CARE_PROVIDER_SITE_OTHER): Payer: Medicare Other

## 2020-04-23 DIAGNOSIS — Z79891 Long term (current) use of opiate analgesic: Secondary | ICD-10-CM

## 2020-04-23 DIAGNOSIS — G4733 Obstructive sleep apnea (adult) (pediatric): Secondary | ICD-10-CM | POA: Diagnosis not present

## 2020-04-23 DIAGNOSIS — E785 Hyperlipidemia, unspecified: Secondary | ICD-10-CM | POA: Diagnosis not present

## 2020-04-23 DIAGNOSIS — K5791 Diverticulosis of intestine, part unspecified, without perforation or abscess with bleeding: Secondary | ICD-10-CM

## 2020-04-23 DIAGNOSIS — E663 Overweight: Secondary | ICD-10-CM

## 2020-04-23 DIAGNOSIS — E039 Hypothyroidism, unspecified: Secondary | ICD-10-CM

## 2020-04-23 DIAGNOSIS — R630 Anorexia: Secondary | ICD-10-CM

## 2020-04-23 DIAGNOSIS — Z87891 Personal history of nicotine dependence: Secondary | ICD-10-CM

## 2020-04-23 DIAGNOSIS — Z6826 Body mass index (BMI) 26.0-26.9, adult: Secondary | ICD-10-CM

## 2020-04-23 DIAGNOSIS — J45909 Unspecified asthma, uncomplicated: Secondary | ICD-10-CM | POA: Diagnosis not present

## 2020-04-23 DIAGNOSIS — Z48812 Encounter for surgical aftercare following surgery on the circulatory system: Secondary | ICD-10-CM

## 2020-04-23 DIAGNOSIS — Z7901 Long term (current) use of anticoagulants: Secondary | ICD-10-CM

## 2020-04-23 DIAGNOSIS — K219 Gastro-esophageal reflux disease without esophagitis: Secondary | ICD-10-CM

## 2020-04-23 DIAGNOSIS — M1990 Unspecified osteoarthritis, unspecified site: Secondary | ICD-10-CM

## 2020-04-23 DIAGNOSIS — I480 Paroxysmal atrial fibrillation: Secondary | ICD-10-CM | POA: Diagnosis not present

## 2020-04-23 DIAGNOSIS — I951 Orthostatic hypotension: Secondary | ICD-10-CM | POA: Diagnosis not present

## 2020-04-23 DIAGNOSIS — Z952 Presence of prosthetic heart valve: Secondary | ICD-10-CM

## 2020-04-23 DIAGNOSIS — E876 Hypokalemia: Secondary | ICD-10-CM | POA: Diagnosis not present

## 2020-04-23 DIAGNOSIS — Z955 Presence of coronary angioplasty implant and graft: Secondary | ICD-10-CM

## 2020-04-23 DIAGNOSIS — N179 Acute kidney failure, unspecified: Secondary | ICD-10-CM

## 2020-04-23 DIAGNOSIS — I10 Essential (primary) hypertension: Secondary | ICD-10-CM

## 2020-04-23 DIAGNOSIS — H269 Unspecified cataract: Secondary | ICD-10-CM

## 2020-04-23 DIAGNOSIS — K5792 Diverticulitis of intestine, part unspecified, without perforation or abscess without bleeding: Secondary | ICD-10-CM | POA: Diagnosis not present

## 2020-04-24 DIAGNOSIS — K5792 Diverticulitis of intestine, part unspecified, without perforation or abscess without bleeding: Secondary | ICD-10-CM | POA: Diagnosis not present

## 2020-04-24 DIAGNOSIS — N179 Acute kidney failure, unspecified: Secondary | ICD-10-CM | POA: Diagnosis not present

## 2020-04-24 DIAGNOSIS — Z48812 Encounter for surgical aftercare following surgery on the circulatory system: Secondary | ICD-10-CM | POA: Diagnosis not present

## 2020-04-24 DIAGNOSIS — E039 Hypothyroidism, unspecified: Secondary | ICD-10-CM | POA: Diagnosis not present

## 2020-04-24 DIAGNOSIS — J45909 Unspecified asthma, uncomplicated: Secondary | ICD-10-CM | POA: Diagnosis not present

## 2020-04-24 DIAGNOSIS — I951 Orthostatic hypotension: Secondary | ICD-10-CM | POA: Diagnosis not present

## 2020-04-24 DIAGNOSIS — I480 Paroxysmal atrial fibrillation: Secondary | ICD-10-CM | POA: Diagnosis not present

## 2020-04-24 DIAGNOSIS — G4733 Obstructive sleep apnea (adult) (pediatric): Secondary | ICD-10-CM | POA: Diagnosis not present

## 2020-04-24 DIAGNOSIS — E785 Hyperlipidemia, unspecified: Secondary | ICD-10-CM | POA: Diagnosis not present

## 2020-04-24 DIAGNOSIS — M1990 Unspecified osteoarthritis, unspecified site: Secondary | ICD-10-CM | POA: Diagnosis not present

## 2020-04-24 DIAGNOSIS — I10 Essential (primary) hypertension: Secondary | ICD-10-CM | POA: Diagnosis not present

## 2020-04-24 DIAGNOSIS — E876 Hypokalemia: Secondary | ICD-10-CM | POA: Diagnosis not present

## 2020-04-27 ENCOUNTER — Ambulatory Visit: Payer: Medicare Other | Admitting: Cardiology

## 2020-04-27 ENCOUNTER — Telehealth: Payer: Self-pay | Admitting: *Deleted

## 2020-04-27 ENCOUNTER — Other Ambulatory Visit: Payer: Self-pay | Admitting: Physician Assistant

## 2020-04-27 ENCOUNTER — Encounter: Payer: Self-pay | Admitting: Cardiology

## 2020-04-27 ENCOUNTER — Other Ambulatory Visit: Payer: Self-pay

## 2020-04-27 VITALS — BP 106/80 | HR 77 | Ht 70.0 in | Wt 173.8 lb

## 2020-04-27 DIAGNOSIS — Z9889 Other specified postprocedural states: Secondary | ICD-10-CM

## 2020-04-27 DIAGNOSIS — I480 Paroxysmal atrial fibrillation: Secondary | ICD-10-CM | POA: Diagnosis not present

## 2020-04-27 DIAGNOSIS — E876 Hypokalemia: Secondary | ICD-10-CM | POA: Diagnosis not present

## 2020-04-27 DIAGNOSIS — I3139 Other pericardial effusion (noninflammatory): Secondary | ICD-10-CM

## 2020-04-27 DIAGNOSIS — I4891 Unspecified atrial fibrillation: Secondary | ICD-10-CM | POA: Insufficient documentation

## 2020-04-27 DIAGNOSIS — J45909 Unspecified asthma, uncomplicated: Secondary | ICD-10-CM | POA: Diagnosis not present

## 2020-04-27 DIAGNOSIS — E039 Hypothyroidism, unspecified: Secondary | ICD-10-CM | POA: Diagnosis not present

## 2020-04-27 DIAGNOSIS — G4733 Obstructive sleep apnea (adult) (pediatric): Secondary | ICD-10-CM | POA: Diagnosis not present

## 2020-04-27 DIAGNOSIS — D649 Anemia, unspecified: Secondary | ICD-10-CM | POA: Diagnosis not present

## 2020-04-27 DIAGNOSIS — Z7901 Long term (current) use of anticoagulants: Secondary | ICD-10-CM | POA: Diagnosis not present

## 2020-04-27 DIAGNOSIS — E785 Hyperlipidemia, unspecified: Secondary | ICD-10-CM | POA: Diagnosis not present

## 2020-04-27 DIAGNOSIS — Z48812 Encounter for surgical aftercare following surgery on the circulatory system: Secondary | ICD-10-CM | POA: Diagnosis not present

## 2020-04-27 DIAGNOSIS — I10 Essential (primary) hypertension: Secondary | ICD-10-CM | POA: Diagnosis not present

## 2020-04-27 DIAGNOSIS — Z79899 Other long term (current) drug therapy: Secondary | ICD-10-CM | POA: Diagnosis not present

## 2020-04-27 DIAGNOSIS — K5792 Diverticulitis of intestine, part unspecified, without perforation or abscess without bleeding: Secondary | ICD-10-CM | POA: Diagnosis not present

## 2020-04-27 DIAGNOSIS — I951 Orthostatic hypotension: Secondary | ICD-10-CM | POA: Diagnosis not present

## 2020-04-27 DIAGNOSIS — I313 Pericardial effusion (noninflammatory): Secondary | ICD-10-CM

## 2020-04-27 DIAGNOSIS — N179 Acute kidney failure, unspecified: Secondary | ICD-10-CM | POA: Diagnosis not present

## 2020-04-27 DIAGNOSIS — M1990 Unspecified osteoarthritis, unspecified site: Secondary | ICD-10-CM | POA: Diagnosis not present

## 2020-04-27 NOTE — Telephone Encounter (Signed)
Okay continue on that dosage

## 2020-04-27 NOTE — Telephone Encounter (Signed)
Pt aware to take 2.5 mg of coumadin on Tues & Sat, then 5 mg on all other days, I will call Whitney to do his INR in a week. LMOVM for Whitney with the above detailed instructions again on pt's coumadin dosage and recheck in one week.   The above is from his previous telephone encounter.

## 2020-04-27 NOTE — Patient Instructions (Signed)
Medication Instructions:  Continue current medications  *If you need a refill on your cardiac medications before your next appointment, please call your pharmacy*   Lab Work: CBC and BMP Today  If you have labs (blood work) drawn today and your tests are completely normal, you will receive your results only by: Marland Kitchen MyChart Message (if you have MyChart) OR . A paper copy in the mail If you have any lab test that is abnormal or we need to change your treatment, we will call you to review the results.   Testing/Procedures: None Ordered   Follow-Up: At [facility], you and your health needs are our priority.  As part of our continuing mission to provide you with exceptional heart care, we have created designated Provider Care Teams.  These Care Teams include your primary Cardiologist (physician) and Advanced Practice Providers (APPs -  Physician Assistants and Nurse Practitioners) who all work together to provide you with the care you need, when you need it.  We recommend signing up for the patient portal called "MyChart".  Sign up information is provided on this After Visit Summary.  MyChart is used to connect with patients for Virtual Visits (Telemedicine).  Patients are able to view lab/test results, encounter notes, upcoming appointments, etc.  Non-urgent messages can be sent to your provider as well.   To learn more about what you can do with MyChart, go to [email or web address].    Your next appointment:   Keep appointment on February 7th @ 3:40 pm  The format for your next appointment:   In Person  Provider:   Shelva Majestic, MD

## 2020-04-27 NOTE — Addendum Note (Signed)
Addended by: Vennie Homans on: 04/27/2020 10:00 AM   Modules accepted: Orders

## 2020-04-27 NOTE — Telephone Encounter (Signed)
I do not have his current dose, what dose is he taking currently for his Coumadin?

## 2020-04-27 NOTE — Progress Notes (Signed)
Cardiology Office Note:    Date:  04/27/2020   ID:  Patrick Bell, DOB Oct 03, 1940, MRN [id number]  PCP:  Dettinger, Fransisca Kaufmann, MD  Cardiologist:  Shelva Majestic, MD  Electrophysiologist:  None   Referring MD: Dettinger, Fransisca Kaufmann, MD   CC: fatigue  History of Present Illness:    Patrick Bell is a pleasant 80 y.o. male with a hx of hypertension and sleep apnea.  He has been seen by cardiology about 10 years ago.  He was referred to Dr. Claiborne Billings in September 2021 with complaints of dyspnea on exertion and an echocardiogram that showed severe MR.  The patient was not on any medications for hypertension and was not compliant with CPAP.  A TEE was done which confirmed severe mitral regurgitation.  After review it was decided to refer the patient to Dr. Ihor Gully for surgical evaluation.  Dr. Roxy Manns recommended mitral valve repair.  Preop coronary angiogram was done 02/21/2020 that showed no coronary disease.  His LV function was normal.  Patient was admitted for mitral valve repair which was done 03/11/2020.  This was complicated by thoracic aortic dissection requiring urgent repair.  His hospital course was further complicated by pericardial effusion requiring pericardial window, PAF requiring amiodarone, new hypothyroidism, acute kidney injury, and hypotension.  The patient was discharged to [facility] on 03/31/2020 on Amiodarone 200mg  daily, Lasix 40mg  daily, Toprol-XL 25mg  daily, Simvastatin 10mg  daily, Synthroid 25mg  daily, and Coumadin. Home Losartan was discontinued. His time in CIR was relatively uneventful. Lasix was discontinued due to worsening renal function. He was discharged from [facility] on 04/07/2020.  He was seen for follow-up by CT Surgery on 04/13/2020 at which time he reported 10-15 lb unintentional weight loss as well as decreased appetite and fatigue/weakness. He was encouraged to increase caloric intake by eating smaller meals throughout the day and trying protein supplements. There was concern that  his hypothyroidism may be playing a role and he was encouraged to follow-up with his PCP.  The patient was contacted 04/21/2020 by phone for follow-up.  He said labs were drawn by his PCP but he had been unable to get the results.  He and his wife were quite anxious about these results and the fact that they have been unable to communicate with their PCP.    I had him come to the office today to be evaluated.  He tells me he feels fatigued all the time, he falls asleep frequently during the day. He has low grade anorexia.   Past Medical History:  Diagnosis Date  . Acute thoracic aortic dissection (HCC) 03/11/2020   Type A  . Allergy   . Asthma   . Cataract   . Cataract   . Diverticulitis   . Dyspnea   . GERD (gastroesophageal reflux disease)   . Heart murmur 12/2019  . Hyperlipidemia   . Hypertension   . Inguinal hernia bilateral, non-recurrent   . S/P aortic dissection repair 03/11/2020   supracoronary straight graft repair with resuspension of native aortic valve and open distal anastomosis for intraoperative acute type A aortic dissection   . S/P mitral valve repair 03/11/2020   Complex valvuloplasty including artificial Gore-tex neochord placement x 4, suture plication of posterior leaflet and posteromedial commissure and 32 mm Sorin Memo 4D ring annuloplasty  . Sleep apnea     Past Surgical History:  Procedure Laterality Date  . bilateral inguinal hernia    . CARDIAC CATHETERIZATION Bilateral 02/21/2020  . CHEST TUBE INSERTION N/A 03/22/2020  Procedure: CHEST TUBE INSERTION OF 28 BLAKE DRAIN.;  Surgeon: Grace Isaac, MD;  Location: [facility] OR;  Service: Thoracic;  Laterality: N/A;  . COLONOSCOPY  06/09/2004   WUJ:[id number] colon diverticulosis, otherwise normal colonoscopy  . EYE SURGERY  08/2014  . HERNIA REPAIR    . MITRAL VALVE REPAIR N/A 03/11/2020   Procedure: MITRAL VALVE REPAIR (MVR) USING MEMO 4D SIZE 32 RING;  Surgeon: Rexene Alberts, MD;  Location: [place];  Service:  Open Heart Surgery;  Laterality: N/A;  . PALATE / UVULA BIOPSY / EXCISION    . PERICARDIAL WINDOW N/A 03/22/2020   Procedure: SUBXYPHOID POST-OP PERICARDIAL FLUID DRAINAGE WITH CHEST TUBE INSERTION.;  Surgeon: Grace Isaac, MD;  Location: [place];  Service: Thoracic;  Laterality: N/A;  . REPAIR OF ACUTE ASCENDING THORACIC AORTIC DISSECTION N/A 03/11/2020   Procedure: REPAIR OF ACUTE ASCENDING THORACIC AORTIC DISSECTION USING A HEMASHIELD PLATINUM 26MM STRAIGHT GRAFT AND A HEASHIELD PLATINUM 28X10MM SINGLE ARM GRAFT;  Surgeon: Rexene Alberts, MD;  Location: [place];  Service: Open Heart Surgery;  Laterality: N/A;  . RIGHT/LEFT HEART CATH AND CORONARY ANGIOGRAPHY N/A 02/21/2020   Procedure: RIGHT/LEFT HEART CATH AND CORONARY ANGIOGRAPHY;  Surgeon: Troy Sine, MD;  Location: [facility];  Service: Cardiovascular;  Laterality: N/A;  . TEE WITHOUT CARDIOVERSION N/A 12/19/2019   Procedure: TRANSESOPHAGEAL ECHOCARDIOGRAM (TEE);  Surgeon: Buford Dresser, MD;  Location: [facility] ENDOSCOPY;  Service: Cardiovascular;  Laterality: N/A;  . TEE WITHOUT CARDIOVERSION N/A 03/11/2020   Procedure: TRANSESOPHAGEAL ECHOCARDIOGRAM (TEE);  Surgeon: Rexene Alberts, MD;  Location: [place];  Service: Open Heart Surgery;  Laterality: N/A;  . TEE WITHOUT CARDIOVERSION N/A 03/22/2020   Procedure: TRANSESOPHAGEAL ECHOCARDIOGRAM (TEE);  Surgeon: Grace Isaac, MD;  Location: [facility] OR;  Service: Thoracic;  Laterality: N/A;  . TONSILLECTOMY    . VASECTOMY      Current Medications: Current Meds  Medication Sig  . albuterol (PROAIR HFA) 108 (90 Base) MCG/ACT inhaler Inhale 2 puffs into the lungs every 6 (six) hours as needed for wheezing.  Marland Kitchen amiodarone (PACERONE) 200 MG tablet Take 1 tablet (200 mg total) by mouth daily.  Marland Kitchen docusate sodium (COLACE) 100 MG capsule Take 2 capsules (200 mg total) by mouth daily.  Marland Kitchen levothyroxine (SYNTHROID) 25 MCG tablet Take 1 tablet (25 mcg total) by mouth daily at 6 (six) AM.   . metoprolol succinate (TOPROL-XL) 25 MG 24 hr tablet Take 1 tablet (25 mg total) by mouth daily.  . montelukast (SINGULAIR) 10 MG tablet Take 1 tablet (10 mg total) daily by mouth.  . Multiple Vitamin (MULTIVITAMIN WITH MINERALS) TABS tablet Take 1 tablet by mouth daily.  . pantoprazole (PROTONIX) 40 MG tablet Take 1 tablet (40 mg total) daily by mouth.  . simvastatin (ZOCOR) 10 MG tablet Take 1 tablet (10 mg total) by mouth at bedtime.     Allergies:   Patient has no known allergies.   Social History   Socioeconomic History  . Marital status: Married    Spouse name: Wells Guiles  . Number of children: 2  . Years of education: 11  . Highest education level: Some college, no degree  Occupational History  . 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 Smoking status: Former Smoker    Packs/day: 4.00    Years: 25.00    Pack years: 100.00    Types: Cigarettes    Quit date: 06/23/1982  Years since quitting: 37.8  . Smokeless tobacco: Never Used  . 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 Vaping Use: Never used  Substance and Sexual Activity  . Alcohol use: Yes    Alcohol/week: 5.0 standard drinks    Types: 5 Shots of liquor per week    Comment: brandy a few times a month, beer once a month  . Drug use: No  . Sexual activity: Yes    Birth control/protection: Surgical  Other Topics Concern  . Not on file  Social History Narrative  . Not on file   Social Determinants of Health   Financial Resource Strain: Not on file  Food Insecurity: Not on file  Transportation Needs: Not on file  Physical Activity: Not on file  Stress: Not on file  Social Connections: Not on file     Family History: The patient's family history includes Arthritis in his brother, father, and sister; Dementia (age of onset: 68) in his mother; Heart disease in his father and mother; Hip fracture (age of onset: 42) in his mother; Osteoporosis in  his mother. There is no history of Colon cancer.  ROS:   Please see the history of present illness.     All other systems reviewed and are negative.  EKGs/Labs/Other Studies Reviewed:    The following studies were reviewed today:  Echo 03/28/2020- IMPRESSIONS    1. Limited echo for pericardial effusion. A trivial effusion is now  present. NSR is now present.  2. Left ventricular ejection fraction, by estimation, is 60 to 65%. The  left ventricle has normal function.  3. Right ventricular systolic function is normal. The right ventricular  size is normal.  4. The mitral valve has been repaired/replaced. No evidence of mitral  valve regurgitation. The mean mitral valve gradient is 5.0 mmHg with  average heart rate of 76 bpm. There is a 32 mm prosthetic annuloplasty  ring present in the mitral position.  Procedure Date: 03/11/2020.  5. The aortic valve is grossly normal.  6. The inferior vena cava is dilated in size with >50% respiratory  variability, suggesting right atrial pressure of 8 mmHg.   EKG:  EKG is ordered today.  The ekg ordered today demonstrates NSR, HR 77, QTc 463, inferior Qs  Recent Labs: 03/22/2020: TSH 6.518 04/01/2020: ALT 21; Magnesium 2.0 04/06/2020: BUN 23; Creatinine, Ser 1.52; Potassium 3.8; Sodium 134 04/07/2020: Hemoglobin 8.8; Platelets 389  Recent Lipid Panel    Component Value Date/Time   CHOL 184 11/14/2019 1004   CHOL 168 06/18/2012 1031   TRIG 87 11/14/2019 1004   TRIG 101 01/14/2013 1218   TRIG 102 06/18/2012 1031   HDL 70 11/14/2019 1004   HDL 65 01/14/2013 1218   HDL 62 06/18/2012 1031   CHOLHDL 2.6 11/14/2019 1004   LDLCALC 98 11/14/2019 1004   LDLCALC 99 01/14/2013 1218   LDLCALC 86 06/18/2012 1031    Physical Exam:    VS:  BP 106/80 (BP Location: Left Arm, Patient Position: Sitting)   Pulse 77   Ht 5\' 10"  (1.778 m)   Wt 173 lb 12.8 oz (78.8 kg)   SpO2 96%   BMI 24.94 kg/m     Wt Readings from Last 3 Encounters:   04/27/20 173 lb 12.8 oz (78.8 kg)  04/21/20 170 lb 9.6 oz (77.4 kg)  04/13/20 170 lb (77.1 kg)     GEN: Well nourished, well developed in no acute distress HEENT: Normal NECK: No JVD;  No carotid bruits CARDIAC: RRR, no murmurs, rubs, gallops RESPIRATORY:  Clear to auscultation without rales, wheezing or rhonchi  ABDOMEN: Soft, non-tender, non-distended MUSCULOSKELETAL:  No edema; No deformity  SKIN: Warm and dry NEUROLOGIC:  Alert and oriented x 3 PSYCHIATRIC:  Normal affect   ASSESSMENT:    1. Severe MR s/p MVR complicated by acute Type A dissection s/p repair and pericardial effusion. Overall he sound stable from this standpoint  2. Post op Afib:NSR today -- amiodarone 200mg  daily, Lopressor 25mg  daily -- coumadin per PCP but he would like to change to [facility] and has an appointment in our Coumadin clinic.  3. S/p pericardial effusion: per Dr. Servando Snare. Limited follow up echo 12/25 with trivial effusion  4. HTN: Controlled -- continue Toprol 25mg  daily  5. HLD: on Zocor 10mg  daily  6. AKI:Cr stable at 1.52 on 04/06/2020  8. Anemia: Hgb stable at 8.8 on 04/07/2020  PLAN:    Check CBC and BMP today.  He was prescribed Iron at discharge but was unable to get it through his pharmacy.  He may need OTC Iron depending on his Labs.    I encouraged him to be patient- he has been through a lot and this is not something you bounce right back from, especially at 80 y/o, but I told him I thought eventually he would be fine. Keep f/u with Dr Claiborne Billings as scheduled.    Medication Adjustments/Labs and Tests Ordered: Current medicines are reviewed at length with the patient today.  Concerns regarding medicines are outlined above.  Orders Placed This Encounter  Procedures  . EKG 12-Lead   No orders of the defined types were placed in this encounter.   Patient Instructions  Medication Instructions:  Continue current medications  *If you need a refill on your cardiac  medications before your next appointment, please call your pharmacy*   Lab Work: CBC and BMP Today  If you have labs (blood work) drawn today and your tests are completely normal, you will receive your results only by: Marland Kitchen MyChart Message (if you have MyChart) OR . A paper copy in the mail If you have any lab test that is abnormal or we need to change your treatment, we will call you to review the results.   Testing/Procedures: None Ordered   Follow-Up: At [facility], you and your health needs are our priority.  As part of our continuing mission to provide you with exceptional heart care, we have created designated Provider Care Teams.  These Care Teams include your primary Cardiologist (physician) and Advanced Practice Providers (APPs -  Physician Assistants and Nurse Practitioners) who all work together to provide you with the care you need, when you need it.  We recommend signing up for the patient portal called "MyChart".  Sign up information is provided on this After Visit Summary.  MyChart is used to connect with patients for Virtual Visits (Telemedicine).  Patients are able to view lab/test results, encounter notes, upcoming appointments, etc.  Non-urgent messages can be sent to your provider as well.   To learn more about what you can do with MyChart, go to [email or web address].    Your next appointment:   Keep appointment on February 7th @ 3:40 pm  The format for your next appointment:   In Person  Provider:   Shelva Majestic, MD        Signed, [name], PA-C  04/27/2020 2:36 PM    [place]

## 2020-04-27 NOTE — Telephone Encounter (Signed)
VM from [place] w/ [facility] INR 2.6 PT 30.9  Please call [facility] with dose changes & next INR check

## 2020-04-28 ENCOUNTER — Telehealth: Payer: Self-pay | Admitting: Cardiology

## 2020-04-28 ENCOUNTER — Other Ambulatory Visit (HOSPITAL_COMMUNITY): Payer: Medicare Other

## 2020-04-28 LAB — CBC
Hematocrit: 33.7 % — ABNORMAL LOW (ref 37.5–51.0)
Hemoglobin: 10.9 g/dL — ABNORMAL LOW (ref 13.0–17.7)
MCH: 27.5 pg (ref 26.6–33.0)
MCHC: 32.3 g/dL (ref 31.5–35.7)
MCV: 85 fL (ref 79–97)
Platelets: 376 10*3/uL (ref 150–450)
RBC: 3.97 x10E6/uL — ABNORMAL LOW (ref 4.14–5.80)
RDW: 14.3 % (ref 11.6–15.4)
WBC: 6.9 10*3/uL (ref 3.4–10.8)

## 2020-04-28 LAB — BASIC METABOLIC PANEL
BUN/Creatinine Ratio: 14 (ref 10–24)
BUN: 23 mg/dL (ref 8–27)
CO2: 25 mmol/L (ref 20–29)
Calcium: 9.1 mg/dL (ref 8.6–10.2)
Chloride: 104 mmol/L (ref 96–106)
Creatinine, Ser: 1.64 mg/dL — ABNORMAL HIGH (ref 0.76–1.27)
GFR calc Af Amer: 45 mL/min/{1.73_m2} — ABNORMAL LOW (ref >59)
GFR calc non Af Amer: 39 mL/min/{1.73_m2} — ABNORMAL LOW (ref >59)
Glucose: 109 mg/dL — ABNORMAL HIGH (ref 65–99)
Potassium: 4.4 mmol/L (ref 3.5–5.2)
Sodium: 142 mmol/L (ref 134–144)

## 2020-04-28 NOTE — Telephone Encounter (Signed)
I called Mr. Denz to review his lab work.  I told him his hemoglobin look good.  He did appear to be mildly dehydrated and I encouraged him to hydrate.  Based on his lab work I told him I I think his fatigue is continued postop recovery.  He will call us if something changes and he will keep his follow-up appointments as scheduled.  [name] PA-C 04/28/2020 9:43 AM

## 2020-04-29 ENCOUNTER — Other Ambulatory Visit: Payer: Self-pay

## 2020-04-29 ENCOUNTER — Ambulatory Visit (INDEPENDENT_AMBULATORY_CARE_PROVIDER_SITE_OTHER): Payer: Medicare Other

## 2020-04-29 DIAGNOSIS — Z9889 Other specified postprocedural states: Secondary | ICD-10-CM

## 2020-04-29 DIAGNOSIS — Z5181 Encounter for therapeutic drug level monitoring: Secondary | ICD-10-CM

## 2020-04-29 DIAGNOSIS — I71019 Dissection of thoracic aorta, unspecified: Secondary | ICD-10-CM

## 2020-04-29 DIAGNOSIS — I7101 Dissection of thoracic aorta: Secondary | ICD-10-CM | POA: Diagnosis not present

## 2020-04-29 LAB — POCT INR: INR: 3.9 — AB (ref 2.0–3.0)

## 2020-04-29 NOTE — Patient Instructions (Signed)
Hold tonight only and then decrease to 0.5 tablet daily except 1 tablet on Monday and Friday Recheck in 1 week

## 2020-04-30 ENCOUNTER — Telehealth: Payer: Self-pay | Admitting: *Deleted

## 2020-04-30 DIAGNOSIS — N179 Acute kidney failure, unspecified: Secondary | ICD-10-CM | POA: Diagnosis not present

## 2020-04-30 DIAGNOSIS — E876 Hypokalemia: Secondary | ICD-10-CM | POA: Diagnosis not present

## 2020-04-30 DIAGNOSIS — I10 Essential (primary) hypertension: Secondary | ICD-10-CM | POA: Diagnosis not present

## 2020-04-30 DIAGNOSIS — M1990 Unspecified osteoarthritis, unspecified site: Secondary | ICD-10-CM | POA: Diagnosis not present

## 2020-04-30 DIAGNOSIS — Z48812 Encounter for surgical aftercare following surgery on the circulatory system: Secondary | ICD-10-CM | POA: Diagnosis not present

## 2020-04-30 DIAGNOSIS — E039 Hypothyroidism, unspecified: Secondary | ICD-10-CM | POA: Diagnosis not present

## 2020-04-30 DIAGNOSIS — I480 Paroxysmal atrial fibrillation: Secondary | ICD-10-CM | POA: Diagnosis not present

## 2020-04-30 DIAGNOSIS — E785 Hyperlipidemia, unspecified: Secondary | ICD-10-CM | POA: Diagnosis not present

## 2020-04-30 DIAGNOSIS — K5792 Diverticulitis of intestine, part unspecified, without perforation or abscess without bleeding: Secondary | ICD-10-CM | POA: Diagnosis not present

## 2020-04-30 DIAGNOSIS — G4733 Obstructive sleep apnea (adult) (pediatric): Secondary | ICD-10-CM | POA: Diagnosis not present

## 2020-04-30 DIAGNOSIS — J45909 Unspecified asthma, uncomplicated: Secondary | ICD-10-CM | POA: Diagnosis not present

## 2020-04-30 DIAGNOSIS — I951 Orthostatic hypotension: Secondary | ICD-10-CM | POA: Diagnosis not present

## 2020-04-30 NOTE — Telephone Encounter (Signed)
Call from [place] w/ [facility] She has been doing PT/INRs on patient and calling results to [place] She found out that pt has been going to Cardiologist and having this done as well Pt is going to stay with Cardiology managing this

## 2020-05-01 DIAGNOSIS — K5792 Diverticulitis of intestine, part unspecified, without perforation or abscess without bleeding: Secondary | ICD-10-CM | POA: Diagnosis not present

## 2020-05-01 DIAGNOSIS — Z48812 Encounter for surgical aftercare following surgery on the circulatory system: Secondary | ICD-10-CM | POA: Diagnosis not present

## 2020-05-01 DIAGNOSIS — E039 Hypothyroidism, unspecified: Secondary | ICD-10-CM | POA: Diagnosis not present

## 2020-05-01 DIAGNOSIS — N179 Acute kidney failure, unspecified: Secondary | ICD-10-CM | POA: Diagnosis not present

## 2020-05-01 DIAGNOSIS — G4733 Obstructive sleep apnea (adult) (pediatric): Secondary | ICD-10-CM | POA: Diagnosis not present

## 2020-05-01 DIAGNOSIS — E876 Hypokalemia: Secondary | ICD-10-CM | POA: Diagnosis not present

## 2020-05-01 DIAGNOSIS — I480 Paroxysmal atrial fibrillation: Secondary | ICD-10-CM | POA: Diagnosis not present

## 2020-05-01 DIAGNOSIS — M1990 Unspecified osteoarthritis, unspecified site: Secondary | ICD-10-CM | POA: Diagnosis not present

## 2020-05-01 DIAGNOSIS — E785 Hyperlipidemia, unspecified: Secondary | ICD-10-CM | POA: Diagnosis not present

## 2020-05-01 DIAGNOSIS — I951 Orthostatic hypotension: Secondary | ICD-10-CM | POA: Diagnosis not present

## 2020-05-01 DIAGNOSIS — I10 Essential (primary) hypertension: Secondary | ICD-10-CM | POA: Diagnosis not present

## 2020-05-01 DIAGNOSIS — J45909 Unspecified asthma, uncomplicated: Secondary | ICD-10-CM | POA: Diagnosis not present

## 2020-05-01 NOTE — Telephone Encounter (Signed)
Ok thanks 

## 2020-05-04 ENCOUNTER — Other Ambulatory Visit: Payer: Self-pay

## 2020-05-04 ENCOUNTER — Ambulatory Visit (INDEPENDENT_AMBULATORY_CARE_PROVIDER_SITE_OTHER): Payer: Self-pay | Admitting: Physician Assistant

## 2020-05-04 VITALS — BP 127/70 | HR 72 | Temp 98.1°F | Resp 20 | Wt 170.4 lb

## 2020-05-04 DIAGNOSIS — G4733 Obstructive sleep apnea (adult) (pediatric): Secondary | ICD-10-CM | POA: Diagnosis not present

## 2020-05-04 DIAGNOSIS — E039 Hypothyroidism, unspecified: Secondary | ICD-10-CM | POA: Diagnosis not present

## 2020-05-04 DIAGNOSIS — J45909 Unspecified asthma, uncomplicated: Secondary | ICD-10-CM | POA: Diagnosis not present

## 2020-05-04 DIAGNOSIS — Z9889 Other specified postprocedural states: Secondary | ICD-10-CM

## 2020-05-04 DIAGNOSIS — K5792 Diverticulitis of intestine, part unspecified, without perforation or abscess without bleeding: Secondary | ICD-10-CM | POA: Diagnosis not present

## 2020-05-04 DIAGNOSIS — M1990 Unspecified osteoarthritis, unspecified site: Secondary | ICD-10-CM | POA: Diagnosis not present

## 2020-05-04 DIAGNOSIS — N179 Acute kidney failure, unspecified: Secondary | ICD-10-CM | POA: Diagnosis not present

## 2020-05-04 DIAGNOSIS — Z48812 Encounter for surgical aftercare following surgery on the circulatory system: Secondary | ICD-10-CM | POA: Diagnosis not present

## 2020-05-04 DIAGNOSIS — I480 Paroxysmal atrial fibrillation: Secondary | ICD-10-CM | POA: Diagnosis not present

## 2020-05-04 DIAGNOSIS — I3139 Other pericardial effusion (noninflammatory): Secondary | ICD-10-CM

## 2020-05-04 DIAGNOSIS — I10 Essential (primary) hypertension: Secondary | ICD-10-CM | POA: Diagnosis not present

## 2020-05-04 DIAGNOSIS — I313 Pericardial effusion (noninflammatory): Secondary | ICD-10-CM

## 2020-05-04 DIAGNOSIS — E785 Hyperlipidemia, unspecified: Secondary | ICD-10-CM | POA: Diagnosis not present

## 2020-05-04 DIAGNOSIS — I951 Orthostatic hypotension: Secondary | ICD-10-CM | POA: Diagnosis not present

## 2020-05-04 DIAGNOSIS — E876 Hypokalemia: Secondary | ICD-10-CM | POA: Diagnosis not present

## 2020-05-04 LAB — THYROID PANEL WITH TSH
Free Thyroxine Index: 2.8 (ref 1.4–3.8)
T3 Uptake: 35 % (ref 22–35)
T4, Total: 7.9 ug/dL (ref 4.9–10.5)
TSH: 4.66 mIU/L — ABNORMAL HIGH (ref 0.40–4.50)

## 2020-05-04 NOTE — Progress Notes (Addendum)
HPI: Patient is s/p mitral valve repair with complex valvuloplasty and repair of acute type A aortic dissection by Dr. Roxy Manns on 03/11/2020. He then had a subxiphoid pericardial window by Dr. Servando Snare on 03/22/2020. Patient returns for follow up. He was last seen in the office by Nicholes Rough PA-C on 01/10. At that time,  The patient had complaints of unintentional weight loss, weakness, and fatigue. He has since seen [employer] PA-C on 04/28/2020. Again, he had complaints of anorexia and fatigue. Of note, INR was found to be supra therapeutic at both of these office visits as well.  Cardiology is now following his INR and adjusting Coumadin accordingly. Patient is no longer losing weight. He eats several meals daily but does not "snack" as often as previously. He still has complaints of fatigue but denies chest pain, shortness of breath, fever, or cough.    Current Outpatient Medications  Medication Sig Dispense Refill  . acetaminophen (TYLENOL) 325 MG tablet Take 1-2 tablets (325-650 mg total) by mouth every 4 (four) hours as needed for mild pain.    Marland Kitchen albuterol (PROAIR HFA) 108 (90 Base) MCG/ACT inhaler Inhale 2 puffs into the lungs every 6 (six) hours as needed for wheezing. 8.5 g 2  . amiodarone (PACERONE) 200 MG tablet Take 1 tablet (200 mg total) by mouth daily. 30 tablet 1  . docusate sodium (COLACE) 100 MG capsule Take 2 capsules (200 mg total) by mouth daily. 10 capsule 0  . hydroxypropyl methylcellulose / hypromellose (ISOPTO TEARS / GONIOVISC) 2.5 % ophthalmic solution Place 1-2 drops into both eyes 3 (three) times daily as needed (dry/irritated eyes.).    Marland Kitchen levothyroxine (SYNTHROID) 25 MCG tablet Take 1 tablet (25 mcg total) by mouth daily at 6 (six) AM. 90 tablet 1  . metoprolol succinate (TOPROL-XL) 25 MG 24 hr tablet Take 1 tablet (25 mg total) by mouth daily. 30 tablet 1  . montelukast (SINGULAIR) 10 MG tablet Take 1 tablet (10 mg total) daily by mouth. 90 tablet 3  . Multiple  Vitamin (MULTIVITAMIN WITH MINERALS) TABS tablet Take 1 tablet by mouth daily.    . Olopatadine HCl 0.2 % SOLN Place 1 drop into both eyes daily as needed (allergies).    . pantoprazole (PROTONIX) 40 MG tablet Take 1 tablet (40 mg total) daily by mouth. 90 tablet 3  . simvastatin (ZOCOR) 10 MG tablet Take 1 tablet (10 mg total) by mouth at bedtime. 90 tablet 3  . warfarin (COUMADIN) 5 MG tablet Take 1 tablet (5 mg total) by mouth daily at 4 PM. (Patient taking differently: Take 5 mg by mouth daily at 4 PM. Patient takes 1 tablet every day exceptTuesday and Saturday which is 1/2 tablet) 30 tablet 0  Vital Signs: Temp 98.1 F, BP 98/70, HR 72, RR 20, Oxygenation 98% on room air   Physical Exam : CV-RRR Pulmonary-Clear to auscultation bilaterally Extremities-No LE edmea Wounds-Clean, dry, and healing without signs of infection   Impression and Plan: We had a discussion regarding the timing of his recovery. I discussed with him that given the nature of his surgeries and post op course, he likely will require more time to recover. He is noticing some progress;however, he would like his progress to occur faster than it is. He states that almost every morning after taking Levothyroxine, he has nausea and does not feel well. He is requesting that his "thyroid labs be checked' so this has been arranged. He is still on daily Amiodarone, but does not  think nausea is from this. Once thyroid results are available, we will better be able to determine if Levothyroxine needs to be adjusted and/or stopped. Also, his last INR was supra therapeutic at 3.9 again. Cardiology is adjusting Coumadin accordingly. Also, CBC and BMET were done on 01/24. His creatinine was slightly increased from 1.52 to 1.64. He is not an ACE or diuretic. His H and H was stable at 10.9 and 33.7. He has a follow up echo scheduled for 02/03. He has an appointment to see Dr. Claiborne Billings next week and will return to see Dr. Roxy Manns in a few  weeks.   Nani Skillern, PA-C [facility] [phone]

## 2020-05-04 NOTE — Progress Notes (Signed)
ty

## 2020-05-05 ENCOUNTER — Telehealth: Payer: Self-pay

## 2020-05-05 NOTE — Telephone Encounter (Signed)
Patient recently had thyroid lab work and it was recommended he change his Levothyroxine dosage from 25 mcg per day to 50 mcg per day.  There was only a slight change in lab values and patient is not comfortable changing his dosage.  He would like to discuss with Dr. Warrick Parisian.  An appointment was scheduled on 05/08/2020.

## 2020-05-06 ENCOUNTER — Ambulatory Visit (INDEPENDENT_AMBULATORY_CARE_PROVIDER_SITE_OTHER): Payer: Medicare Other

## 2020-05-06 ENCOUNTER — Other Ambulatory Visit: Payer: Self-pay

## 2020-05-06 DIAGNOSIS — Z9889 Other specified postprocedural states: Secondary | ICD-10-CM

## 2020-05-06 DIAGNOSIS — I7101 Dissection of thoracic aorta: Secondary | ICD-10-CM

## 2020-05-06 DIAGNOSIS — I71019 Dissection of thoracic aorta, unspecified: Secondary | ICD-10-CM

## 2020-05-06 DIAGNOSIS — Z5181 Encounter for therapeutic drug level monitoring: Secondary | ICD-10-CM | POA: Diagnosis not present

## 2020-05-06 LAB — POCT INR: INR: 2.1 (ref 2.0–3.0)

## 2020-05-06 NOTE — Patient Instructions (Signed)
Continue taking 0.5 tablet daily except 1 tablet on Monday and Friday Recheck in 1 week

## 2020-05-07 ENCOUNTER — Ambulatory Visit (HOSPITAL_COMMUNITY): Payer: Medicare Other | Attending: Cardiology

## 2020-05-07 DIAGNOSIS — I340 Nonrheumatic mitral (valve) insufficiency: Secondary | ICD-10-CM | POA: Diagnosis not present

## 2020-05-07 DIAGNOSIS — Z9889 Other specified postprocedural states: Secondary | ICD-10-CM | POA: Insufficient documentation

## 2020-05-07 LAB — ECHOCARDIOGRAM COMPLETE
Area-P 1/2: 2.94 cm2
MV VTI: 1.92 cm2
S' Lateral: 3.4 cm

## 2020-05-08 ENCOUNTER — Telehealth (HOSPITAL_COMMUNITY): Payer: Self-pay

## 2020-05-08 ENCOUNTER — Other Ambulatory Visit: Payer: Self-pay

## 2020-05-08 ENCOUNTER — Ambulatory Visit (INDEPENDENT_AMBULATORY_CARE_PROVIDER_SITE_OTHER): Payer: Medicare Other | Admitting: Family Medicine

## 2020-05-08 ENCOUNTER — Encounter: Payer: Self-pay | Admitting: Family Medicine

## 2020-05-08 VITALS — BP 106/71 | HR 74 | Ht 70.0 in | Wt 174.5 lb

## 2020-05-08 DIAGNOSIS — E039 Hypothyroidism, unspecified: Secondary | ICD-10-CM | POA: Diagnosis not present

## 2020-05-08 MED ORDER — LEVOTHYROXINE SODIUM 50 MCG PO TABS: 50.0000 ug | ORAL_TABLET | Freq: Every day | ORAL | 3 refills | Status: DC

## 2020-05-08 MED ORDER — WARFARIN SODIUM 5 MG PO TABS: 5.0000 mg | ORAL_TABLET | Freq: Every day | ORAL | 3 refills | Status: DC

## 2020-05-08 NOTE — Progress Notes (Signed)
BP 106/71   Pulse 74   Ht 5\' 10"  (1.778 m)   Wt 174 lb 8 oz (79.2 kg)   SpO2 99%   BMI 25.04 kg/m    Subjective:   Patient ID: Patrick Bell, male    DOB: 09-25-1940, 80 y.o.   MRN: [id number]  HPI: Patrick Bell is a 80 y.o. male presenting on 05/08/2020 for Medical Management of Chronic Issues and Hypothyroidism   HPI Pt coming in to follow up on chronic issues after recent surgery. He was in the hospital in December for mitral valve repair surgery where he also experienced an aortic dissection that was repaired during surgery. After he was discharged, he was told to follow up with Dr. Warrick Parisian to manage new conditions and medications.   Thyroid He states that they found his TSH to be elevated and started him on Levothyroxine 25 mcg which he has been taking for the past 6 weeks. He was told this dose would need increased but he is concerned because he reports nausea and upset stomach after taking it. TSH on Monday was 4.66. Reports having lost 15 lbs while hospitalized. He denies cold intolerance, swelling, new hair loss, palpitations, chest pain, or difficulty breathing. He says that he usually waits and hour to an hour and a half after taking the levothyroxine before eating. He also takes pantoprazole at night for GERD.   Anticoagulation He was started on Warfarin after his surgery. His INR today is 2.1. He denies bleeding, bruising, or falls. He states the warfarin is doing fine and he has no problems with that.  Relevant past medical, surgical, family and social history reviewed and updated as indicated. Interim medical history since our last visit reviewed. Allergies and medications reviewed and updated.  Review of Systems  Constitutional: Negative for appetite change, chills, fatigue, fever and unexpected weight change.  HENT: Negative.  Negative for voice change.   Eyes: Negative.   Respiratory: Negative for cough, choking, chest tightness and shortness of breath.    Cardiovascular: Negative for chest pain, palpitations and leg swelling.  Gastrointestinal: Negative for abdominal pain, blood in stool, constipation, diarrhea, nausea and vomiting.  Endocrine: Negative for cold intolerance, heat intolerance and polyuria.  Genitourinary: Positive for urgency (gets up at night because he feels the need to go). Negative for decreased urine volume, difficulty urinating, dysuria, flank pain, frequency and hematuria.  Neurological: Negative for dizziness, syncope, weakness, light-headedness, numbness and headaches.  Hematological: Does not bruise/bleed easily.  Psychiatric/Behavioral: Negative.     Per HPI unless specifically indicated above   Allergies as of 05/08/2020   No Known Allergies     Medication List       Accurate as of May 08, 2020 11:59 PM. If you have any questions, ask your nurse or doctor.        acetaminophen 325 MG tablet Commonly known as: TYLENOL Take 1-2 tablets (325-650 mg total) by mouth every 4 (four) hours as needed for mild pain.   albuterol 108 (90 Base) MCG/ACT inhaler Commonly known as: ProAir HFA Inhale 2 puffs into the lungs every 6 (six) hours as needed for wheezing.   amiodarone 200 MG tablet Commonly known as: PACERONE Take 1 tablet (200 mg total) by mouth daily.   docusate sodium 100 MG capsule Commonly known as: COLACE Take 2 capsules (200 mg total) by mouth daily.   hydroxypropyl methylcellulose / hypromellose 2.5 % ophthalmic solution Commonly known as: ISOPTO TEARS / GONIOVISC Place 1-2 drops  into both eyes 3 (three) times daily as needed (dry/irritated eyes.).   levothyroxine 50 MCG tablet Commonly known as: SYNTHROID Take 1 tablet (50 mcg total) by mouth daily. What changed:   medication strength  how much to take  when to take this Changed by: Worthy Rancher, MD   metoprolol succinate 25 MG 24 hr tablet Commonly known as: TOPROL-XL Take 1 tablet (25 mg total) by mouth daily.    montelukast 10 MG tablet Commonly known as: SINGULAIR Take 1 tablet (10 mg total) daily by mouth.   multivitamin with minerals Tabs tablet Take 1 tablet by mouth daily.   Olopatadine HCl 0.2 % Soln Place 1 drop into both eyes daily as needed (allergies).   pantoprazole 40 MG tablet Commonly known as: PROTONIX Take 1 tablet (40 mg total) daily by mouth.   simvastatin 10 MG tablet Commonly known as: ZOCOR Take 1 tablet (10 mg total) by mouth at bedtime.   warfarin 5 MG tablet Commonly known as: COUMADIN Take as directed by the anticoagulation clinic. If you are unsure how to take this medication, talk to your nurse or doctor. Original instructions: Take 1 tablet (5 mg total) by mouth daily at 4 PM. What changed: additional instructions        Objective:   BP 106/71   Pulse 74   Ht 5\' 10"  (1.778 m)   Wt 174 lb 8 oz (79.2 kg)   SpO2 99%   BMI 25.04 kg/m   Wt Readings from Last 3 Encounters:  05/08/20 174 lb 8 oz (79.2 kg)  05/04/20 170 lb 6.4 oz (77.3 kg)  04/27/20 173 lb 12.8 oz (78.8 kg)    Physical Exam Constitutional:      General: He is not in acute distress.    Appearance: Normal appearance.  Eyes:     Extraocular Movements: Extraocular movements intact.     Conjunctiva/sclera: Conjunctivae normal.     Pupils: Pupils are equal, round, and reactive to light.  Cardiovascular:     Rate and Rhythm: Normal rate and regular rhythm.     Pulses: Normal pulses.     Heart sounds: Normal heart sounds. No murmur heard.   Pulmonary:     Effort: Pulmonary effort is normal.     Breath sounds: Normal breath sounds. No rales.  Abdominal:     Palpations: Abdomen is soft.     Tenderness: There is no right CVA tenderness, left CVA tenderness or rebound.  Musculoskeletal:        General: No swelling.     Cervical back: Normal range of motion and neck supple.     Right lower leg: No edema.     Left lower leg: No edema.  Skin:    General: Skin is warm and dry.   Neurological:     General: No focal deficit present.     Mental Status: He is alert and oriented to person, place, and time.  Psychiatric:        Mood and Affect: Mood normal.        Behavior: Behavior normal.       Assessment & Plan:   Problem List Items Addressed This Visit      Endocrine   Hypothyroidism - Primary   Relevant Medications   levothyroxine (SYNTHROID) 50 MCG tablet      Description   Continue taking 0.5 tablet daily except 1 tablet on Monday and Friday Recheck in 4-6 week INR 2.1 (2.0-3.0)  Follow up plan: Return in about 2 months (around 07/06/2020), or if symptoms worsen or fail to improve, for thyroid.  Pt was counseled on his thyroid and how it helps to maintain his metabolism and keep his heart healthy. He expressed understanding and willingness to try increasing his Levothyroxine dose to 50 mcg and to switch to taking it at night 30 min before dinner to see if there are any changes in his morning symptoms. He will continue all other medications.  Counseling provided for all of the vaccine components No orders of the defined types were placed in this encounter.  Silverio Decamp 05/08/2020  I was personally present for all components of the history, physical exam and/or medical decision making.  I agree with the documentation performed by the PA student and agree with assessment and plan above.  PA student was [employer]  Caryl Pina, MD [facility] 05/10/2020, 10:31 PM

## 2020-05-08 NOTE — Telephone Encounter (Signed)
Called patient to see if he was interested in participating in the Cardiac Rehab Program. Patient stated yes. Patient will come in for orientation on 3/10 @ 9AM and will attend the 11AM exercise class. Went over insurance, patient verbalized understanding.   Tourist information centre manager.

## 2020-05-11 ENCOUNTER — Ambulatory Visit: Payer: Medicare Other | Admitting: Cardiovascular Disease

## 2020-05-11 ENCOUNTER — Ambulatory Visit (INDEPENDENT_AMBULATORY_CARE_PROVIDER_SITE_OTHER): Payer: Medicare Other

## 2020-05-11 ENCOUNTER — Other Ambulatory Visit: Payer: Self-pay

## 2020-05-11 ENCOUNTER — Encounter: Payer: Self-pay | Admitting: Cardiovascular Disease

## 2020-05-11 VITALS — BP 150/70 | HR 71 | Ht 70.0 in | Wt 176.4 lb

## 2020-05-11 DIAGNOSIS — I313 Pericardial effusion (noninflammatory): Secondary | ICD-10-CM | POA: Diagnosis not present

## 2020-05-11 DIAGNOSIS — Z9889 Other specified postprocedural states: Secondary | ICD-10-CM | POA: Diagnosis not present

## 2020-05-11 DIAGNOSIS — G4733 Obstructive sleep apnea (adult) (pediatric): Secondary | ICD-10-CM

## 2020-05-11 DIAGNOSIS — I480 Paroxysmal atrial fibrillation: Secondary | ICD-10-CM | POA: Diagnosis not present

## 2020-05-11 DIAGNOSIS — I10 Essential (primary) hypertension: Secondary | ICD-10-CM | POA: Diagnosis not present

## 2020-05-11 DIAGNOSIS — I3139 Other pericardial effusion (noninflammatory): Secondary | ICD-10-CM

## 2020-05-11 DIAGNOSIS — I71019 Dissection of thoracic aorta, unspecified: Secondary | ICD-10-CM

## 2020-05-11 DIAGNOSIS — Z7901 Long term (current) use of anticoagulants: Secondary | ICD-10-CM

## 2020-05-11 DIAGNOSIS — Z5181 Encounter for therapeutic drug level monitoring: Secondary | ICD-10-CM

## 2020-05-11 DIAGNOSIS — I7101 Dissection of thoracic aorta: Secondary | ICD-10-CM

## 2020-05-11 DIAGNOSIS — E785 Hyperlipidemia, unspecified: Secondary | ICD-10-CM

## 2020-05-11 DIAGNOSIS — E039 Hypothyroidism, unspecified: Secondary | ICD-10-CM

## 2020-05-11 LAB — POCT INR: INR: 2 (ref 2.0–3.0)

## 2020-05-11 NOTE — Progress Notes (Signed)
Cardiology Office Note    Date:  05/17/2020   ID:  Patrick Bell, DOB 09/01/1940, MRN [id number]  PCP:  Dettinger, Fransisca Kaufmann, MD  Cardiologist:  Shelva Majestic, MD   F/U evaluation following his prolonged hospitalization following valve surgery  History of Present Illness:  Patrick Bell is a 80 y.o. male who has a history of hypertension, hyperlipidemia, asthma as well as a history of obstructive sleep apnea initially diagnosed in 2016 .  At that time his AHI was 29.5, supine AHI, however was 56.9 and O2 desaturation was 82%.  He initially used CPAP but essentially stopped using therapy for over 3 years. He has a history of hypertension and remotely was on amlodipine but also has been off treatment for at least a year.  When I saw him for initial evaluation he had recently started to notice blood pressure elevation as well as mild shortness of breath. He was recently evaluated by Dr. Warrick Parisian and referred for an echo Doppler study on November 25, 2019 which revealed normal LV function with an EF of 60 to 65% with indeterminant diastolic parameters. RV systolic function was normal. There was mild LA dilation. He was felt to have severe mitral regurgitation with moderate holosystolic prolapse of the middle scallop of the posterior leaflet of the mitral valve. There was moderate mitral annular calcification in the severe MR had eccentric anteriorly directed jet. There was no evidence for mitral stenosis.  He was referred for cardiology evaluation.    When I initially saw him he denied any awareness of rhythm abnormality.  He was sleeping poorly and was unable to sleep on his back.  An Epworth Sleepiness Scale score calculated in the office was significantly increased at 18 consistent with excessive daytime sleepiness.  At my initial evaluation, I recommended that he undergo a transesophageal echocardiogram to assess his severe mitral regurgitation and possible potential flail leaflet.  I also  recommended he undergo a sleep study.  The patient subsequently underwent TEE which confirmed the presence of mitral valve prolapse with flail segment involving a portion of the posterior leaflet and severe mitral regurgitation.  He was subsequently seen by Almyra Deforest, PA and was referred to Dr. Darylene Price for surgical consultation.  He saw Dr. Roxy Manns on January 09, 2020 who felt he was a good candidate for mitral valve repair.  He subsequently has undergone CT imaging studies and was referred for right and left heart cardiac catheterization prior to planned surgery in early December.  He underwent his diagnostic polysomnogram on January 30, 2020 but did not meet split-night criteria and so his initial study was only diagnostic polysomnogram.  This reconfirmed moderate overall sleep apnea with an AHI of 23.1/h; however, sleep apnea was very severe in the supine position with an AHI of 52.2/h.   He subsequently underwent cardiac catheterization by me on February 21, 2020 prior to his planned valve surgery with Dr. Roxy Manns.  He had normal right heart pressures with a PA mean at 20 mm.  Coronary arteries were normal.  LVEDP was 16 mm.  He underwent elective mitral valve repair with complex valvuloplasty by Dr. Mayer Camel on March 11, 2020.  The procedure was complicated by an acute type a aortic dissection requiring graft repair of his aorta and resuspension of his native aortic valve.  His hospital course was further complicated by pericardial effusion requiring pericardial window, atrial fibrillation treated with amiodarone and Coumadin, anemia requiring transfusion, AKA, hypotension as well as a new  diagnosis of hypothyroidism.  After his pericardial window he converted back to sinus rhythm.  He was discharged to inpatient rehab on March 31, 2020 and was ultimately discharged from the rehab unit on April 08, 2019.  He was seen in follow-up by CT surgery on April 13, 2020 and at that time had complaints  of unintentional weight loss with decreased appetite fatigability and weakness.  He was eating smaller meals.  He was having difficulty with significant fatigability and daytime sleepiness.  There was concern that his hypothyroidism may be playing a role and he was encouraged to follow-up with his PCP.  He was evaluated by [name], PA in a telemedicine visit on April 21, 2020.  At that time, his blood pressure was controlled.  Creatinine was stable at 1.52 as was his anemia with a hemoglobin of 8.8.  Presently, he feels worn out.  He denies any chest pain or arrhythmia.  He is not sleeping well.  He is to start cardiac rehab.  He is always tired.  He has nocturia at least 4-5 times per night and has been taking 2 naps per day.  He presents for evaluation.  Past Medical History:  Diagnosis Date  . Acute thoracic aortic dissection (HCC) 03/11/2020   Type A  . Allergy   . Asthma   . Cataract   . Cataract   . Diverticulitis   . Dyspnea   . GERD (gastroesophageal reflux disease)   . Heart murmur 12/2019  . Hyperlipidemia   . Hypertension   . Inguinal hernia bilateral, non-recurrent   . S/P aortic dissection repair 03/11/2020   supracoronary straight graft repair with resuspension of native aortic valve and open distal anastomosis for intraoperative acute type A aortic dissection   . S/P mitral valve repair 03/11/2020   Complex valvuloplasty including artificial Gore-tex neochord placement x 4, suture plication of posterior leaflet and posteromedial commissure and 32 mm Sorin Memo 4D ring annuloplasty  . Sleep apnea     Past Surgical History:  Procedure Laterality Date  . bilateral inguinal hernia    . CARDIAC CATHETERIZATION Bilateral 02/21/2020  . CHEST TUBE INSERTION N/A 03/22/2020   Procedure: CHEST TUBE INSERTION OF 28 BLAKE DRAIN.;  Surgeon: Grace Isaac, MD;  Location: [facility] OR;  Service: Thoracic;  Laterality: N/A;  . COLONOSCOPY  06/09/2004   MVH:QIONGEX colon  diverticulosis, otherwise normal colonoscopy  . EYE SURGERY  08/2014  . HERNIA REPAIR    . MITRAL VALVE REPAIR N/A 03/11/2020   Procedure: MITRAL VALVE REPAIR (MVR) USING MEMO 4D SIZE 32 RING;  Surgeon: Rexene Alberts, MD;  Location: [facility];  Service: Open Heart Surgery;  Laterality: N/A;  . PALATE / UVULA BIOPSY / EXCISION    . PERICARDIAL WINDOW N/A 03/22/2020   Procedure: SUBXYPHOID POST-OP PERICARDIAL FLUID DRAINAGE WITH CHEST TUBE INSERTION.;  Surgeon: Grace Isaac, MD;  Location: [facility];  Service: Thoracic;  Laterality: N/A;  . REPAIR OF ACUTE ASCENDING THORACIC AORTIC DISSECTION N/A 03/11/2020   Procedure: REPAIR OF ACUTE ASCENDING THORACIC AORTIC DISSECTION USING A HEMASHIELD PLATINUM 26MM STRAIGHT GRAFT AND A HEASHIELD PLATINUM 28X10MM SINGLE ARM GRAFT;  Surgeon: Rexene Alberts, MD;  Location: [facility];  Service: Open Heart Surgery;  Laterality: N/A;  . RIGHT/LEFT HEART CATH AND CORONARY ANGIOGRAPHY N/A 02/21/2020   Procedure: RIGHT/LEFT HEART CATH AND CORONARY ANGIOGRAPHY;  Surgeon: Troy Sine, MD;  Location: [facility];  Service: Cardiovascular;  Laterality: N/A;  . TEE WITHOUT CARDIOVERSION  N/A 12/19/2019   Procedure: TRANSESOPHAGEAL ECHOCARDIOGRAM (TEE);  Surgeon: Buford Dresser, MD;  Location: [facility] ENDOSCOPY;  Service: Cardiovascular;  Laterality: N/A;  . TEE WITHOUT CARDIOVERSION N/A 03/11/2020   Procedure: TRANSESOPHAGEAL ECHOCARDIOGRAM (TEE);  Surgeon: Rexene Alberts, MD;  Location: [facility];  Service: Open Heart Surgery;  Laterality: N/A;  . TEE WITHOUT CARDIOVERSION N/A 03/22/2020   Procedure: TRANSESOPHAGEAL ECHOCARDIOGRAM (TEE);  Surgeon: Grace Isaac, MD;  Location: [facility] OR;  Service: Thoracic;  Laterality: N/A;  . TONSILLECTOMY    . VASECTOMY      Current Medications: Outpatient Medications Prior to Visit  Medication Sig Dispense Refill  . acetaminophen (TYLENOL) 325 MG tablet Take 1-2 tablets (325-650 mg total) by mouth every 4 (four) hours as  needed for mild pain.    Marland Kitchen albuterol (PROAIR HFA) 108 (90 Base) MCG/ACT inhaler Inhale 2 puffs into the lungs every 6 (six) hours as needed for wheezing. 8.5 g 2  . amiodarone (PACERONE) 200 MG tablet Take 1 tablet (200 mg total) by mouth daily. 30 tablet 1  . docusate sodium (COLACE) 100 MG capsule Take 2 capsules (200 mg total) by mouth daily. 10 capsule 0  . hydroxypropyl methylcellulose / hypromellose (ISOPTO TEARS / GONIOVISC) 2.5 % ophthalmic solution Place 1-2 drops into both eyes 3 (three) times daily as needed (dry/irritated eyes.).    Marland Kitchen levothyroxine (SYNTHROID) 50 MCG tablet Take 1 tablet (50 mcg total) by mouth daily. 90 tablet 3  . metoprolol succinate (TOPROL-XL) 25 MG 24 hr tablet Take 1 tablet (25 mg total) by mouth daily. 30 tablet 1  . montelukast (SINGULAIR) 10 MG tablet Take 1 tablet (10 mg total) daily by mouth. 90 tablet 3  . Multiple Vitamin (MULTIVITAMIN WITH MINERALS) TABS tablet Take 1 tablet by mouth daily.    . Olopatadine HCl 0.2 % SOLN Place 1 drop into both eyes daily as needed (allergies).    . pantoprazole (PROTONIX) 40 MG tablet Take 1 tablet (40 mg total) daily by mouth. 90 tablet 3  . simvastatin (ZOCOR) 10 MG tablet Take 1 tablet (10 mg total) by mouth at bedtime. 90 tablet 3  . warfarin (COUMADIN) 5 MG tablet Take 1 tablet (5 mg total) by mouth daily at 4 PM. 30 tablet 3   No facility-administered medications prior to visit.     Allergies:   Patient has no known allergies.   Social History   Socioeconomic History  . Marital status: Married    Spouse name: Wells Guiles  . Number of children: 2  . Years of education: 44  . Highest education level: Some college, no degree  Occupational History  . 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 Smoking status: Former Smoker    Packs/day: 4.00    Years: 25.00    Pack years: 100.00    Types: Cigarettes    Quit date: 06/23/1982    Years since quitting: 37.9  .  Smokeless tobacco: Never Used  . 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 Vaping Use: Never used  Substance and Sexual Activity  . Alcohol use: Yes    Alcohol/week: 5.0 standard drinks    Types: 5 Shots of liquor per week    Comment: brandy a few times a month, beer once a month  . Drug use: No  . Sexual activity: Yes    Birth control/protection: Surgical  Other Topics Concern  . Not on  file  Social History Narrative  . Not on file   Social Determinants of Health   Financial Resource Strain: Not on file  Food Insecurity: Not on file  Transportation Needs: Not on file  Physical Activity: Not on file  Stress: Not on file  Social Connections: Not on file     Family History:  The patient's family history includes Arthritis in his brother, father, and sister; Dementia (age of onset: 39) in his mother; Heart disease in his father and mother; Hip fracture (age of onset: 23) in his mother; Osteoporosis in his mother.   ROS General: Negative; No fevers, chills, or night sweats;  HEENT: Negative; No changes in vision or hearing, sinus congestion, difficulty swallowing Pulmonary: Negative; No cough, wheezing, shortness of breath, hemoptysis Cardiovascular: see HPI GI: Negative; No nausea, vomiting, diarrhea, or abdominal pain GU: Negative; No dysuria, hematuria, or difficulty voiding Musculoskeletal: Negative; no myalgias, joint pain, or weakness Hematologic/Oncology: Negative; no easy bruising, bleeding Endocrine: Negative; no heat/cold intolerance; no diabetes Neuro: Negative; no changes in balance, headaches Skin: Negative; No rashes or skin lesions Psychiatric: Negative; No behavioral problems, depression Sleep: Positive for OSA with history of very severe sleep apnea with supine position currently with complaints of snoring, nocturia, daytime sleepiness and marked fatigability; nobruxism, restless legs, hypnogognic hallucinations, no cataplexy Other comprehensive 14  point system review is negative.   PHYSICAL EXAM:   VS:  BP (!) 150/70 (BP Location: Right Arm, Patient Position: Sitting)   Pulse 71   Ht 5' 10"  (1.778 m)   Wt 176 lb 6.4 oz (80 kg)   BMI 25.31 kg/m     Repeat blood pressure by me was 142/70  Wt Readings from Last 3 Encounters:  05/11/20 176 lb 6.4 oz (80 kg)  05/08/20 174 lb 8 oz (79.2 kg)  05/04/20 170 lb 6.4 oz (77.3 kg)    General: Alert, oriented, no distress.  Skin: normal turgor, no rashes, warm and dry HEENT: Normocephalic, atraumatic. Pupils equal round and reactive to light; sclera anicteric; extraocular muscles intact;  Nose without nasal septal hypertrophy Mouth/Parynx benign; Mallinpatti scale 3 Neck: No JVD, no carotid bruits; normal carotid upstroke Lungs: clear to ausculatation and percussion; no wheezing or rales Chest wall: without tenderness to palpitation Heart: PMI not displaced, RRR, s1 s2 normal, 1/6 systolic murmur, no diastolic murmur, no rubs, gallops, thrills, or heaves Abdomen: soft, nontender; no hepatosplenomehaly, BS+; abdominal aorta nontender and not dilated by palpation. Back: no CVA tenderness Pulses 2+ Musculoskeletal: full range of motion, normal strength, no joint deformities Extremities: no clubbing cyanosis or edema, Homan's sign negative  Neurologic: grossly nonfocal; Cranial nerves grossly wnl Psychologic: Normal mood and affect   Studies/Labs Reviewed:   EKG:  EKG is ordered today.  ECG (independently read by me):  NSR at 71; no significant STT changes; QTc 478 msec  Recent Labs: BMP Latest Ref Rng & Units 04/27/2020 04/06/2020 04/03/2020  Glucose 65 - 99 mg/dL 109(H) 89 92  BUN 8 - 27 mg/dL 23 23 23   Creatinine 0.76 - 1.27 mg/dL 1.64(H) 1.52(H) 1.55(H)  BUN/Creat Ratio 10 - 24 14 - -  Sodium 134 - 144 mmol/L 142 134(L) 135  Potassium 3.5 - 5.2 mmol/L 4.4 3.8 4.5  Chloride 96 - 106 mmol/L 104 102 101  CO2 20 - 29 mmol/L 25 24 24   Calcium 8.6 - 10.2 mg/dL 9.1 8.5(L) 8.4(L)      Hepatic Function Latest Ref Rng & Units 04/01/2020 03/23/2020 03/21/2020  Total Protein  6.5 - 8.1 g/dL 5.3(L) 4.9(L) -  Albumin 3.5 - 5.0 g/dL 2.0(L) 2.0(L) 2.1(L)  AST 15 - 41 U/L 18 35 -  ALT 0 - 44 U/L 21 32 -  Alk Phosphatase 38 - 126 U/L 63 62 -  Total Bilirubin 0.3 - 1.2 mg/dL 0.5 0.7 -    CBC Latest Ref Rng & Units 04/27/2020 04/07/2020 04/06/2020  WBC 3.4 - 10.8 x10E3/uL 6.9 5.4 5.4  Hemoglobin 13.0 - 17.7 g/dL 10.9(L) 8.8(L) 9.0(L)  Hematocrit 37.5 - 51.0 % 33.7(L) 28.4(L) 27.4(L)  Platelets 150 - 450 x10E3/uL 376 389 414(H)   Lab Results  Component Value Date   MCV 85 04/27/2020   MCV 87.9 04/07/2020   MCV 86.2 04/06/2020   Lab Results  Component Value Date   TSH 4.66 (H) 05/04/2020   Lab Results  Component Value Date   HGBA1C 5.4 03/09/2020     BNP No results found for: BNP  ProBNP No results found for: PROBNP   Lipid Panel     Component Value Date/Time   CHOL 184 11/14/2019 1004   CHOL 168 06/18/2012 1031   TRIG 87 11/14/2019 1004   TRIG 101 01/14/2013 1218   TRIG 102 06/18/2012 1031   HDL 70 11/14/2019 1004   HDL 65 01/14/2013 1218   HDL 62 06/18/2012 1031   CHOLHDL 2.6 11/14/2019 1004   LDLCALC 98 11/14/2019 1004   LDLCALC 99 01/14/2013 1218   LDLCALC 86 06/18/2012 1031   LABVLDL 16 11/14/2019 1004     RADIOLOGY: ECHOCARDIOGRAM COMPLETE  Result Date: 05/07/2020    ECHOCARDIOGRAM REPORT   Patient Name:   Patrick Bell Date of Exam: 05/07/2020 Medical Rec #:  [id number]       Height:       70.0 in Accession #:    [id number]      Weight:       170.4 lb Date of Birth:  03/18/1941       BSA:          1.950 m Patient Age:    34 years        BP:           127/70 mmHg Patient Gender: M               HR:           71 bpm. Exam Location:  [facility] Procedure: 2D Echo, Cardiac Doppler and Color Doppler Indications:    I34.0 Mitral Regurgitation                 Z98.890  History:        Patient has prior history of Echocardiogram examinations, most                  recent 03/28/2020. Mitral Valve Disease; Risk Factors:Family                 History of Coronary Artery Disease, Hypertension, Dyslipidemia                 and Former Smoker. Mitral Valve Repair (03/11/20, 75m Sorin Memo                 Ring) Complicated by type A aortic dissection and pericardial                 effusion. Status Post Repair and pericardial window.                  Mitral  Valve: 32 mm prosthetic annuloplasty ring valve is                 present in the mitral position. Procedure Date: 03/11/2020.  Sonographer:    Deliah Boston RDCS Referring Phys: [place]  1. Left ventricular ejection fraction, by estimation, is 60 to 65%. The left ventricle has normal function. The left ventricle has no regional wall motion abnormalities. Left ventricular diastolic function could not be evaluated.  2. Right ventricular systolic function is normal. The right ventricular size is normal. There is normal pulmonary artery systolic pressure.  3. Left atrial size was moderately dilated.  4. Right atrial size was mildly dilated.  5. The mitral valve has been repaired/replaced. Trivial mitral valve regurgitation. The mean mitral valve gradient is 4.0 mmHg. There is a 32 mm prosthetic annuloplasty ring present in the mitral position. Procedure Date: 03/11/2020.  6. The aortic valve is tricuspid. There is mild calcification of the aortic valve. There is mild thickening of the aortic valve. Aortic valve regurgitation is trivial.  7. The inferior vena cava is normal in size with greater than 50% respiratory variability, suggesting right atrial pressure of 3 mmHg. Comparison(s): No significant change from prior study. FINDINGS  Left Ventricle: Left ventricular ejection fraction, by estimation, is 60 to 65%. The left ventricle has normal function. The left ventricle has no regional wall motion abnormalities. The left ventricular internal cavity size was normal in size. There is  borderline  left ventricular hypertrophy. Left ventricular diastolic function could not be evaluated due to mitral valve repair. Left ventricular diastolic function could not be evaluated. Right Ventricle: The right ventricular size is normal. No increase in right ventricular wall thickness. Right ventricular systolic function is normal. There is normal pulmonary artery systolic pressure. The tricuspid regurgitant velocity is 2.28 m/s, and  with an assumed right atrial pressure of 3 mmHg, the estimated right ventricular systolic pressure is 59.7 mmHg. Left Atrium: Left atrial size was moderately dilated. Right Atrium: Right atrial size was mildly dilated. Pericardium: Trivial pericardial effusion is present. Mitral Valve: The mitral valve has been repaired/replaced. Trivial mitral valve regurgitation. There is a 32 mm prosthetic annuloplasty ring present in the mitral position. Procedure Date: 03/11/2020. MV peak gradient, 10.0 mmHg. The mean mitral valve gradient is 4.0 mmHg. Tricuspid Valve: The tricuspid valve is normal in structure. Tricuspid valve regurgitation is trivial. No evidence of tricuspid stenosis. Aortic Valve: The aortic valve is tricuspid. There is mild calcification of the aortic valve. There is mild thickening of the aortic valve. Aortic valve regurgitation is trivial. Pulmonic Valve: The pulmonic valve was not well visualized. Pulmonic valve regurgitation is mild to moderate. Aorta: Aortic root could not be assessed and the aortic root, ascending aorta, aortic arch and descending aorta are all structurally normal, with no evidence of dilitation or obstruction. Venous: The inferior vena cava is normal in size with greater than 50% respiratory variability, suggesting right atrial pressure of 3 mmHg. IAS/Shunts: The atrial septum is grossly normal.  LEFT VENTRICLE PLAX 2D LVIDd:         5.25 cm  Diastology LVIDs:         3.40 cm  LV e' medial:    5.33 cm/s LV PW:         1.20 cm  LV E/e' medial:  24.6 LV IVS:         0.75 cm  LV e' lateral:   9.36 cm/s LVOT diam:  2.30 cm  LV E/e' lateral: 14.0 LV SV:         96 LV SV Index:   49 LVOT Area:     4.15 cm  RIGHT VENTRICLE RV S prime:     6.96 cm/s TAPSE (M-mode): 1.4 cm LEFT ATRIUM             Index       RIGHT ATRIUM           Index LA diam:        5.20 cm 2.67 cm/m  RA Area:     18.00 cm LA Vol (A2C):   93.8 ml 48.10 ml/m RA Volume:   46.60 ml  23.90 ml/m LA Vol (A4C):   47.5 ml 24.36 ml/m LA Biplane Vol: 73.6 ml 37.74 ml/m  AORTIC VALVE LVOT Vmax:   95.80 cm/s LVOT Vmean:  60.700 cm/s LVOT VTI:    0.230 m  AORTA Ao Root diam: 4.10 cm Ao Asc diam:  3.10 cm MITRAL VALVE                TRICUSPID VALVE MV Area (PHT): cm          TR Peak grad:   20.8 mmHg MV Area VTI:   1.92 cm     TR Vmax:        228.00 cm/s MV Peak grad:  10.0 mmHg MV Mean grad:  4.0 mmHg     SHUNTS MV Vmax:       1.58 m/s     Systemic VTI:  0.23 m MV Vmean:      86.4 cm/s    Systemic Diam: 2.30 cm MV Decel Time: 258 msec MV E velocity: 131.00 cm/s MV A velocity: 152.00 cm/s MV E/A ratio:  0.86 Buford Dresser MD Electronically signed by Buford Dresser MD Signature Date/Time: 05/07/2020/8:04:27 PM    Final      Additional studies/ records that were reviewed today include:   R/L Heart Cath: 02/21/2020  Normal right heart pressures with PA mean pressure at 20 mmHg.  Normal coronary arteries.  LVEDP: 38mHg  RECOMMENDATION: Mr. Patrick Reppucciis an 80year old gentleman who was recently found to have a cardiac murmur.  He has subsequently been found to have severe mitral regurgitation in the setting of normal LV function with an EF of 60 to 65%.  He is felt to have severe prolapse involving the portion of the posterior leaflet of the mitral valve with at least one ruptured primary chordae tendineae with a flail segment causing severe mitral regurgitation.  Presently, right heart pressures are essentially normal with borderline increased PA systolic pressure at 31 mm.   Coronary anatomy is normal.  He has undergone CT angiography of his vasculature ordered by Dr. ORoxy Manns  He will be following up with Dr. ORoxy Mannsand is tentatively scheduled for mitral valve repair surgery on March 11, 2020.   ECHO 05/07/2020 IMPRESSIONS  1. Left ventricular ejection fraction, by estimation, is 60 to 65%. The  left ventricle has normal function. The left ventricle has no regional  wall motion abnormalities. Left ventricular diastolic function could not  be evaluated.  2. Right ventricular systolic function is normal. The right ventricular  size is normal. There is normal pulmonary artery systolic pressure.  3. Left atrial size was moderately dilated.  4. Right atrial size was mildly dilated.  5. The mitral valve has been repaired/replaced. Trivial mitral valve  regurgitation. The mean mitral valve gradient is 4.0 mmHg. There is  a 32  mm prosthetic annuloplasty ring present in the mitral position. Procedure  Date: 03/11/2020.  6. The aortic valve is tricuspid. There is mild calcification of the  aortic valve. There is mild thickening of the aortic valve. Aortic valve  regurgitation is trivial.  7. The inferior vena cava is normal in size with greater than 50%  respiratory variability, suggesting right atrial pressure of 3 mmHg.    ASSESSMENT:    1. Severe MR: S/P mitral valve repair   2. Acute thoracic aortic dissection (HCC)   3. Pericardial effusion: s/p pericardial window   4. PAF (paroxysmal atrial fibrillation) ([place])   5. Anticoagulated on Coumadin   6. OSA (obstructive sleep apnea)   7. Essential hypertension   8. Hyperlipidemia LDL goal <100   9. Hypothyroidism, unspecified type     PLAN:  Mr. Ayad Nieman is an 80 year old gentleman who has a history of hypertension, and was found to have severe mitral valve prolapse with a flail segment resulting in severe mitral regurgitation.  He had a remote history of sleep apnea which was very severe during supine  sleep and had stopped using CPAP therapy over 3 years ago.  He was found to have normal coronary arteries and was admitted for elective mitral valve repair with complex valvuloplasty and is operation was complicated by acute type a aortic dissection necessitating graft repair of his aorta and resuspension of his native aortic valve.  He also had further complications with pericardial effusion requiring pericardial window, atrial fibrillation, anemia, AKA, and also was diagnosed with hypothyroidism.  His most recent echo of May 07, 2020 was reviewed which showed an EF of 60 to 65%.  The left atrium was moderately dilated and the right atrium was mildly dilated.  There was trivial MR.  Subsequently, he is doing well regarding his complex valve surgery and complications.  He has been having significant difficulty with fatigability and has experienced frequent nocturia up to 4-5 times per night and has had to take up to 2 naps per day.  I was able to review his initial sleep study from 2016 and at that time he had moderate overall sleep apnea but very severe during rem sleep.  His most recent sleep study from January 28, 2020 is comparable with an overall AHI of 23.1/h.  AHI while supine was 52.2/h and his O2 nadir was 83%.  Due to his cardiac surgery he never underwent the follow-up titration.  However, in discussing with him today, he does have his old machine.  Previously [employer] [facility] [employer] was his DME company.  I will try to contact [employer] to has bought out [facility] [employer] to see if they can link his CPAP to our office.  I will initially set him at a auto mode of 7 to 20 cm and then will ask him to reinitiate therapy and adjustments to his parameters will then be made.  I also have recommended he change to a ResMed air fit F 30i which I believe he will tolerate and not had the difficulties that he had before with his prior mask.  Cardiac wise he is maintaining sinus rhythm and has not had recurrent atrial  fibrillation.  His blood pressure today is mildly increased on repeat by me 140/70.  He continues to be on metoprolol succinate 25 mg daily as well as amiodarone 200 mg daily.  He is on levothyroxine 50 mcg.  In addition he is on simvastatin 10 mg as well as warfarin  for anticoagulation.  I will see him in 2 months for follow-up evaluation.   Medication Adjustments/Labs and Tests Ordered: Current medicines are reviewed at length with the patient today.  Concerns regarding medicines are outlined above.  Medication changes, Labs and Tests ordered today are listed in the Patient Instructions below. Patient Instructions  Medication Instructions:  Your physician recommends that you continue on your current medications as directed. Please refer to the Current Medication list given to you today.  *If you need a refill on your cardiac medications before your next appointment, please call your pharmacy  Follow-Up: At [facility], you and your health needs are our priority.  As part of our continuing mission to provide you with exceptional heart care, we have created designated Provider Care Teams.  These Care Teams include your primary Cardiologist (physician) and Advanced Practice Providers (APPs -  Physician Assistants and Nurse Practitioners) who all work together to provide you with the care you need, when you need it.  We recommend signing up for the patient portal called "MyChart".  Sign up information is provided on this After Visit Summary.  MyChart is used to connect with patients for Virtual Visits (Telemedicine).  Patients are able to view lab/test results, encounter notes, upcoming appointments, etc.  Non-urgent messages can be sent to your provider as well.   To learn more about what you can do with MyChart, go to [email or web address].    Your next appointment:   2 month(s)  The format for your next appointment:   In Person  Provider:   Shelva Majestic, MD   Other Instructions We  will contact [employer] tomorrow to try to get you linked and change your CPAP settings.    We will also send orders for a new mask.       Signed, Shelva Majestic, MD  05/17/2020 [facility] [address], [place], [place], [place]  [address] Phone: [phone]

## 2020-05-11 NOTE — Patient Instructions (Signed)
Continue taking 0.5 tablet daily except 1 tablet on Monday and Friday Recheck in 4 weeks

## 2020-05-11 NOTE — Patient Instructions (Signed)
Medication Instructions:  Your physician recommends that you continue on your current medications as directed. Please refer to the Current Medication list given to you today.  *If you need a refill on your cardiac medications before your next appointment, please call your pharmacy  Follow-Up: At [facility], you and your health needs are our priority.  As part of our continuing mission to provide you with exceptional heart care, we have created designated Provider Care Teams.  These Care Teams include your primary Cardiologist (physician) and Advanced Practice Providers (APPs -  Physician Assistants and Nurse Practitioners) who all work together to provide you with the care you need, when you need it.  We recommend signing up for the patient portal called "MyChart".  Sign up information is provided on this After Visit Summary.  MyChart is used to connect with patients for Virtual Visits (Telemedicine).  Patients are able to view lab/test results, encounter notes, upcoming appointments, etc.  Non-urgent messages can be sent to your provider as well.   To learn more about what you can do with MyChart, go to [email or web address].    Your next appointment:   2 month(s)  The format for your next appointment:   In Person  Provider:   Shelva Majestic, MD   Other Instructions We will contact [employer] tomorrow to try to get you linked and change your CPAP settings.    We will also send orders for a new mask.

## 2020-05-12 ENCOUNTER — Telehealth: Payer: Self-pay | Admitting: Cardiovascular Disease

## 2020-05-12 NOTE — Telephone Encounter (Signed)
Spoke with patient regarding 2 month follow up with Dr. Lysbeth Galas Tuesday 07/14/20 at 8:40 am---information available on My Chart and patient voiced his understanding

## 2020-05-13 ENCOUNTER — Other Ambulatory Visit: Payer: Self-pay | Admitting: Cardiovascular Disease

## 2020-05-13 ENCOUNTER — Telehealth: Payer: Self-pay | Admitting: Cardiovascular Disease

## 2020-05-13 DIAGNOSIS — G4733 Obstructive sleep apnea (adult) (pediatric): Secondary | ICD-10-CM

## 2020-05-13 NOTE — Telephone Encounter (Signed)
Patient is requesting to speak with Patrick Bell regarding his CPAP machine.  Please call.

## 2020-05-14 ENCOUNTER — Inpatient Hospital Stay: Payer: Medicare Other | Admitting: Physical Medicine & Rehabilitation

## 2020-05-14 DIAGNOSIS — G4733 Obstructive sleep apnea (adult) (pediatric): Secondary | ICD-10-CM | POA: Diagnosis not present

## 2020-05-14 NOTE — Telephone Encounter (Signed)
Spoke with patient advising him that his machine has been set to the pressures requested by Dr Claiborne Billings. Mask order has been sent to [employer].

## 2020-05-15 ENCOUNTER — Telehealth: Payer: Self-pay | Admitting: *Deleted

## 2020-05-15 NOTE — Telephone Encounter (Signed)
Left message I am returning a call to him in reference to CPAP pressures.he can call me back if he still needs assistance.

## 2020-05-15 NOTE — Telephone Encounter (Signed)
Spoke with patient today in reference to his CPAP pressure. He states that the pressure is too much, and is requesting a change. Patient informed that he is currently on auto pressure of 7-20 CmH2O versus a set pressure. I explained to him that this means the machine will not drop lower than 7 or go any higher than 20. It will give his body what he is requiring within these parameters. Patient was also informed of the ramp and start pressure settings. He is requesting that I change his ramp start time from 45 to 30 mins and his max pressure from 20 to 15 CmH2O. I explained to him that I will need to have a order from Dr Claiborne Billings before I can make the requested changes. Patient made aware that  Dr Claiborne Billings is working in the cath lab today. I will send him a message, however I can't promise that he will get back to me today. Once Dr Claiborne Billings has reviewed and approved any changes I will make them accordingly. Patient voiced understanding of what has been told to him during this conversation. Message sent to Dr Claiborne Billings via phone and secure chat.

## 2020-05-17 ENCOUNTER — Encounter: Payer: Self-pay | Admitting: Cardiovascular Disease

## 2020-05-20 ENCOUNTER — Ambulatory Visit: Payer: Medicare Other | Admitting: Family Medicine

## 2020-05-20 DIAGNOSIS — M9903 Segmental and somatic dysfunction of lumbar region: Secondary | ICD-10-CM | POA: Diagnosis not present

## 2020-05-20 DIAGNOSIS — M5137 Other intervertebral disc degeneration, lumbosacral region: Secondary | ICD-10-CM | POA: Diagnosis not present

## 2020-05-20 DIAGNOSIS — M9904 Segmental and somatic dysfunction of sacral region: Secondary | ICD-10-CM | POA: Diagnosis not present

## 2020-05-20 DIAGNOSIS — M9905 Segmental and somatic dysfunction of pelvic region: Secondary | ICD-10-CM | POA: Diagnosis not present

## 2020-05-21 DIAGNOSIS — M9903 Segmental and somatic dysfunction of lumbar region: Secondary | ICD-10-CM | POA: Diagnosis not present

## 2020-05-21 DIAGNOSIS — M9905 Segmental and somatic dysfunction of pelvic region: Secondary | ICD-10-CM | POA: Diagnosis not present

## 2020-05-21 DIAGNOSIS — M5137 Other intervertebral disc degeneration, lumbosacral region: Secondary | ICD-10-CM | POA: Diagnosis not present

## 2020-05-21 DIAGNOSIS — M9904 Segmental and somatic dysfunction of sacral region: Secondary | ICD-10-CM | POA: Diagnosis not present

## 2020-05-25 ENCOUNTER — Other Ambulatory Visit: Payer: Self-pay

## 2020-05-25 ENCOUNTER — Encounter: Payer: Self-pay | Admitting: Thoracic Surgery (Cardiothoracic Vascular Surgery)

## 2020-05-25 ENCOUNTER — Other Ambulatory Visit: Payer: Self-pay | Admitting: *Deleted

## 2020-05-25 ENCOUNTER — Ambulatory Visit (INDEPENDENT_AMBULATORY_CARE_PROVIDER_SITE_OTHER): Payer: Self-pay | Admitting: Thoracic Surgery (Cardiothoracic Vascular Surgery)

## 2020-05-25 VITALS — BP 128/78 | HR 71 | Resp 20 | Ht 70.0 in | Wt 176.0 lb

## 2020-05-25 DIAGNOSIS — M9903 Segmental and somatic dysfunction of lumbar region: Secondary | ICD-10-CM | POA: Diagnosis not present

## 2020-05-25 DIAGNOSIS — M5137 Other intervertebral disc degeneration, lumbosacral region: Secondary | ICD-10-CM | POA: Diagnosis not present

## 2020-05-25 DIAGNOSIS — Z9889 Other specified postprocedural states: Secondary | ICD-10-CM

## 2020-05-25 DIAGNOSIS — M9905 Segmental and somatic dysfunction of pelvic region: Secondary | ICD-10-CM | POA: Diagnosis not present

## 2020-05-25 DIAGNOSIS — M9904 Segmental and somatic dysfunction of sacral region: Secondary | ICD-10-CM | POA: Diagnosis not present

## 2020-05-25 NOTE — Patient Instructions (Addendum)
Stop taking amiodarone.  Continue all other medications without change.  Notify the pharmacist at the Coumadin clinic that you are no longer taking amiodarone.  Schedule CT angiogram to reevaluate your previous aortic dissection.  You will need IV hydration for the test due to baseline chronic kidney disease.  Please remember the need for life long follow-up and recommendations regarding physical restrictions related to the history of aortic dissection.  These include the need for strict blood pressure control, periodic radiographic follow-up examinations, and avoidance of certain types of physical activity such as heavy lifting or other strenuous physical activities which tend to increase pressure within the chest or abdomen or cause sudden increase in your blood pressure.  Continue to avoid any heavy lifting or strenuous use of your arms or shoulders for at least a total of three months from the time of surgery.  After three months you may gradually increase how much you lift or otherwise use your arms or chest as tolerated, with limits based upon whether or not activities lead to the return of significant discomfort.  You are encouraged to enroll and participate in the outpatient cardiac rehab program beginning as soon as practical.  Endocarditis is a potentially serious infection of heart valves or inside lining of the heart.  It occurs more commonly in patients with diseased heart valves (such as patient's with aortic or mitral valve disease) and in patients who have undergone heart valve repair or replacement.  Certain surgical and dental procedures may put you at risk, such as dental cleaning, other dental procedures, or any surgery involving the respiratory, urinary, gastrointestinal tract, gallbladder or prostate gland.   To minimize your chances for develooping endocarditis, maintain good oral health and seek prompt medical attention for any infections involving the mouth, teeth, gums, skin  or urinary tract.    Always notify your doctor or dentist about your underlying heart valve condition before having any invasive procedures. You will need to take antibiotics before certain procedures, including all routine dental cleanings or other dental procedures.  Your cardiologist or dentist should prescribe these antibiotics for you to be taken ahead of time.

## 2020-05-25 NOTE — Progress Notes (Signed)
[address]       [place] [address]             [phone]     CARDIOTHORACIC SURGERY OFFICE NOTE  Primary Cardiologist is Shelva Majestic, MD PCP is Dettinger, Fransisca Kaufmann, MD   HPI:  Patient is an 80 year old male with history of hypertension, obstructive sleep apnea, hyperlipidemia, GE reflux disease, and remote history of heavy tobacco use who returns to the office today for follow-up status post mitral valve repair on March 11, 2020.  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  The patient's early postoperative recovery was slow and complicated by postoperative atrial fibrillation and the development of pericardial effusion requiring subxiphoid pericardial window on March 22, 2020.  He eventually was discharged to the inpatient rehab service where he continued to recover without further complication.  He was last seen in follow-up in our office on May 04, 2020 at which time he was making progress.  Of note, follow-up thyroid function test revealed hypothyroidism for which his dose of Synthroid has been adjusted.  Routine follow-up echocardiogram performed May 07, 2020 revealed normal left ventricular systolic function with ejection fraction estimated 60 to 65%.  Mitral valve repair appeared intact with trivial residual mitral regurgitation and no functional mitral stenosis.  Similarly, the resuspended native aortic valve was functioning normally with trivial aortic insufficiency.  Patient returns to the office today and reports that he continues to make steady progress.  He no longer has any significant pain in his chest.  His exercise tolerance is  gradually improving.  He is not walking much on a regular basis but he is not having any shortness of breath.  He has not had any palpitations nor other symptoms to suggest a recurrence of atrial fibrillation.  Overall he is pleased with his progress.  He does note that he has always had trouble sleeping dating back to prior to surgery, and recently he was seen by Dr. Claiborne Billings who has encouraged plans to resume CPAP for treatment of sleep apnea.   Current Outpatient Medications  Medication Sig Dispense Refill  . acetaminophen (TYLENOL) 325 MG tablet Take 1-2 tablets (325-650 mg total) by mouth every 4 (four) hours as needed for mild pain.    Marland Kitchen albuterol (PROAIR HFA) 108 (90 Base) MCG/ACT inhaler Inhale 2 puffs into the lungs every 6 (six) hours as needed for wheezing. 8.5 g 2  . amiodarone (PACERONE) 200 MG tablet Take 1 tablet (200 mg total) by mouth daily. 30 tablet 1  . docusate sodium (COLACE) 100 MG capsule Take 2 capsules (200 mg total) by mouth daily. 10 capsule 0  . hydroxypropyl methylcellulose / hypromellose (ISOPTO TEARS / GONIOVISC) 2.5 % ophthalmic solution Place 1-2 drops into both eyes 3 (three) times daily as needed (dry/irritated eyes.).    Marland Kitchen levothyroxine (SYNTHROID) 50 MCG tablet Take 1 tablet (50 mcg total) by mouth daily. 90 tablet 3  . metoprolol succinate (TOPROL-XL) 25 MG 24 hr tablet Take 1 tablet (25 mg total) by mouth daily. 30 tablet 1  . montelukast (SINGULAIR) 10 MG tablet Take 1 tablet (10 mg total) daily by mouth. 90 tablet 3  . Multiple Vitamin (MULTIVITAMIN WITH MINERALS) TABS  tablet Take 1 tablet by mouth daily.    . Olopatadine HCl 0.2 % SOLN Place 1 drop into both eyes daily as needed (allergies).    . pantoprazole (PROTONIX) 40 MG tablet Take 1 tablet (40 mg total) daily by mouth. 90 tablet 3  . simvastatin (ZOCOR) 10 MG tablet Take 1 tablet (10 mg total) by mouth at bedtime. 90 tablet 3  . warfarin (COUMADIN) 5 MG tablet Take 1 tablet (5 mg total) by mouth daily  at 4 PM. 30 tablet 3   No current facility-administered medications for this visit.      Physical Exam:   BP 128/78 (BP Location: Right Arm, Patient Position: Sitting)   Pulse 71   Resp 20   Ht 5\' 10"  (1.778 m)   Wt 176 lb (79.8 kg)   SpO2 98% Comment: RA  BMI 25.25 kg/m   General:  Well-appearing  Chest:   Clear to auscultation  CV:   Regular rate and rhythm without murmur  Incisions:  Healing nicely  Abdomen:  Soft nontender  Extremities:  Warm and well-perfused  Diagnostic Tests:  ECHOCARDIOGRAM REPORT       Patient Name:  Patrick Bell Date of Exam: 05/07/2020  Medical Rec #: [id number]    Height:    70.0 in  Accession #:  [id number]   Weight:    170.4 lb  Date of Birth: 05-Feb-1941    BSA:     1.950 m  Patient Age:  57 years    BP:      127/70 mmHg  Patient Gender: M        HR:      71 bpm.  Exam Location: [address]   Procedure: 2D Echo, Cardiac Doppler and Color Doppler   Indications:  I34.0 Mitral Regurgitation         Z98.890    History:    Patient has prior history of Echocardiogram examinations,  most         recent 03/28/2020. Mitral Valve Disease; Risk  Factors:Family         History of Coronary Artery Disease, Hypertension,  Dyslipidemia         and Former Smoker. Mitral Valve Repair (03/11/20, 34mm  Sorin Memo         Ring) Complicated by type A aortic dissection and  pericardial         effusion. Status Post Repair and pericardial window.           Mitral Valve: 32 mm prosthetic annuloplasty ring valve is         present in the mitral position. Procedure Date: 03/11/2020.    Sonographer:  Deliah Boston RDCS  Referring Phys: [place]    1. Left ventricular ejection fraction, by estimation, is 60 to 65%. The  left ventricle has normal function. The left ventricle has no regional   wall motion abnormalities. Left ventricular diastolic function could not  be evaluated.  2. Right ventricular systolic function is normal. The right ventricular  size is normal. There is normal pulmonary artery systolic pressure.  3. Left atrial size was moderately dilated.  4. Right atrial size was mildly dilated.  5. The mitral valve has been repaired/replaced. Trivial mitral valve  regurgitation. The mean mitral valve gradient is 4.0 mmHg. There is a 32  mm prosthetic annuloplasty ring present in the mitral position. Procedure  Date: 03/11/2020.  6. The aortic valve is tricuspid. There is mild calcification  of the  aortic valve. There is mild thickening of the aortic valve. Aortic valve  regurgitation is trivial.  7. The inferior vena cava is normal in size with greater than 50%  respiratory variability, suggesting right atrial pressure of 3 mmHg.   Comparison(s): No significant change from prior study.   FINDINGS  Left Ventricle: Left ventricular ejection fraction, by estimation, is 60  to 65%. The left ventricle has normal function. The left ventricle has no  regional wall motion abnormalities. The left ventricular internal cavity  size was normal in size. There is  borderline left ventricular hypertrophy. Left ventricular diastolic  function could not be evaluated due to mitral valve repair. Left  ventricular diastolic function could not be evaluated.   Right Ventricle: The right ventricular size is normal. No increase in  right ventricular wall thickness. Right ventricular systolic function is  normal. There is normal pulmonary artery systolic pressure. The tricuspid  regurgitant velocity is 2.28 m/s, and  with an assumed right atrial pressure of 3 mmHg, the estimated right  ventricular systolic pressure is 24.2 mmHg.   Left Atrium: Left atrial size was moderately dilated.   Right Atrium: Right atrial size was mildly dilated.   Pericardium: Trivial pericardial  effusion is present.   Mitral Valve: The mitral valve has been repaired/replaced. Trivial mitral  valve regurgitation. There is a 32 mm prosthetic annuloplasty ring present  in the mitral position. Procedure Date: 03/11/2020. MV peak gradient, 10.0  mmHg. The mean mitral valve  gradient is 4.0 mmHg.   Tricuspid Valve: The tricuspid valve is normal in structure. Tricuspid  valve regurgitation is trivial. No evidence of tricuspid stenosis.   Aortic Valve: The aortic valve is tricuspid. There is mild calcification  of the aortic valve. There is mild thickening of the aortic valve. Aortic  valve regurgitation is trivial.   Pulmonic Valve: The pulmonic valve was not well visualized. Pulmonic valve  regurgitation is mild to moderate.   Aorta: Aortic root could not be assessed and the aortic root, ascending  aorta, aortic arch and descending aorta are all structurally normal, with  no evidence of dilitation or obstruction.   Venous: The inferior vena cava is normal in size with greater than 50%  respiratory variability, suggesting right atrial pressure of 3 mmHg.   IAS/Shunts: The atrial septum is grossly normal.     LEFT VENTRICLE  PLAX 2D  LVIDd:     5.25 cm Diastology  LVIDs:     3.40 cm LV e' medial:  5.33 cm/s  LV PW:     1.20 cm LV E/e' medial: 24.6  LV IVS:    0.75 cm LV e' lateral:  9.36 cm/s  LVOT diam:   2.30 cm LV E/e' lateral: 14.0  LV SV:     96  LV SV Index:  49  LVOT Area:   4.15 cm     RIGHT VENTRICLE  RV S prime:   6.96 cm/s  TAPSE (M-mode): 1.4 cm   LEFT ATRIUM       Index    RIGHT ATRIUM      Index  LA diam:    5.20 cm 2.67 cm/m RA Area:   18.00 cm  LA Vol (A2C):  93.8 ml 48.10 ml/m RA Volume:  46.60 ml 23.90 ml/m  LA Vol (A4C):  47.5 ml 24.36 ml/m  LA Biplane Vol: 73.6 ml 37.74 ml/m  AORTIC VALVE  LVOT Vmax:  95.80 cm/s  LVOT Vmean: 60.700 cm/s  LVOT  VTI:  0.230 m    AORTA   Ao Root diam: 4.10 cm  Ao Asc diam: 3.10 cm   MITRAL VALVE        TRICUSPID VALVE  MV Area (PHT): cm     TR Peak grad:  20.8 mmHg  MV Area VTI:  1.92 cm   TR Vmax:    228.00 cm/s  MV Peak grad: 10.0 mmHg  MV Mean grad: 4.0 mmHg   SHUNTS  MV Vmax:    1.58 m/s   Systemic VTI: 0.23 m  MV Vmean:   86.4 cm/s  Systemic Diam: 2.30 cm  MV Decel Time: 258 msec  MV E velocity: 131.00 cm/s  MV A velocity: 152.00 cm/s  MV E/A ratio: 0.86   Buford Dresser MD  Electronically signed by Buford Dresser MD  Signature Date/Time: 05/07/2020/8:04:27 PM    Impression:  Patient is doing very well nearly 3 months status post mitral valve repair complicated by acute type a aortic dissection requiring surgical repair.  I have personally reviewed the patient's recent follow-up echocardiogram which reveals normal left ventricular function, intact mitral valve repair, and intact repair of the native aortic valve.    Plan:  We plan CT angiography for follow-up imaging of the patient's aortic dissection.  I have also recommended that the patient go ahead and stop taking amiodarone.  If he continues to maintain sinus rhythm off amiodarone it would be reasonable to stop anticoagulation using warfarin within the next few months.  I have encouraged the patient to continue to gradually increase his physical activity and not specifically encouraged him to participate in the cardiac rehab program.  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  We discussed the need for long-term surveillance and attention to be placed on strict control of blood pressure.  All of his questions have been addressed.  The patient will return in 3 months for follow-up CT imaging.  All of his questions have been addressed.      Valentina Gu. Roxy Manns, MD 05/25/2020 12:43 PM

## 2020-05-28 ENCOUNTER — Telehealth (HOSPITAL_COMMUNITY): Payer: Self-pay | Admitting: Student-PharmD

## 2020-05-28 DIAGNOSIS — M9905 Segmental and somatic dysfunction of pelvic region: Secondary | ICD-10-CM | POA: Diagnosis not present

## 2020-05-28 DIAGNOSIS — M5137 Other intervertebral disc degeneration, lumbosacral region: Secondary | ICD-10-CM | POA: Diagnosis not present

## 2020-05-28 DIAGNOSIS — M9903 Segmental and somatic dysfunction of lumbar region: Secondary | ICD-10-CM | POA: Diagnosis not present

## 2020-05-28 DIAGNOSIS — M9904 Segmental and somatic dysfunction of sacral region: Secondary | ICD-10-CM | POA: Diagnosis not present

## 2020-05-28 NOTE — Telephone Encounter (Signed)
Cardiac Rehab Medication Review by a Pharmacist  Does the patient feel that his/her medications are working for him/her?  yes  Has the patient been experiencing any side effects to the medications prescribed?  no  Does the patient measure his/her own blood pressure or blood glucose at home?  Checks BP at home - reports that he hasn't taken it in the last couple of days but that the numbers have been normal.   Does the patient have any problems obtaining medications due to transportation or finances?   no  Understanding of regimen: good Understanding of indications: good Potential of compliance: good  Pharmacist Intervention: No interventions at this time.  Rebbeca Paul, PharmD PGY1 Pharmacy Resident 05/28/2020 4:13 PM  Please check [email or web address] for unit-specific pharmacy phone numbers.

## 2020-06-02 ENCOUNTER — Encounter: Payer: Self-pay | Admitting: Physical Medicine & Rehabilitation

## 2020-06-02 ENCOUNTER — Other Ambulatory Visit: Payer: Self-pay

## 2020-06-02 ENCOUNTER — Encounter: Payer: Medicare Other | Attending: Physical Medicine & Rehabilitation | Admitting: Physical Medicine & Rehabilitation

## 2020-06-02 VITALS — BP 109/77 | HR 72 | Temp 97.7°F | Ht 70.0 in | Wt 180.6 lb

## 2020-06-02 DIAGNOSIS — R5381 Other malaise: Secondary | ICD-10-CM | POA: Diagnosis not present

## 2020-06-02 DIAGNOSIS — G4733 Obstructive sleep apnea (adult) (pediatric): Secondary | ICD-10-CM | POA: Insufficient documentation

## 2020-06-02 DIAGNOSIS — I951 Orthostatic hypotension: Secondary | ICD-10-CM | POA: Diagnosis not present

## 2020-06-02 DIAGNOSIS — N179 Acute kidney failure, unspecified: Secondary | ICD-10-CM | POA: Insufficient documentation

## 2020-06-02 NOTE — Progress Notes (Signed)
Subjective:    Patient ID: Patrick Bell, male    DOB: [birth year], 80 y.o.   MRN: [id number]  HPI Male with history of HTN severe OSA, MVP with severe regurgitation, mild SOB and non-exertional CP who was admitted on 03/11/20 for MVR and repair of acute type A aortic dissection by Dr. Roxy Manns complicated by Afib presents for follow up debility.   Wife supplements history. At discharge, he was instructed to follow up with TSH, which Cards follow up with. Edema has decreased. He followed up with Cards and CTS. Pain is controlled. He is attempting to try to use CPAP more frequently. Denies falls. He has occasional orthostasis. Denies falls.   Therapies: Starts Cardiac next, completed HH Mobility: Not requiring  Pain Inventory Average Pain 1 Pain Right Now 1 My pain is dull  LOCATION OF PAIN chest  BOWEL Number of stools per week:7 Oral laxative use Yes  Type of laxative stool softner Enema or suppository use No  History of colostomy No  Incontinent No   BLADDER Normal In and out cath, frequency n/a Able to self cath No  Bladder incontinence No  Frequent urination Yes  Leakage with coughing No  Difficulty starting stream No  Incomplete bladder emptying No    Mobility walk without assistance use a cane how many minutes can you walk? 15 ability to climb steps?  yes do you drive?  yes  Function Do you have any goals in this area?  yes  Neuro/Psych bladder control problems  Prior Studies Any changes since last visit?  no  Physicians involved in your care Any changes since last visit?  no   Family History  Problem Relation Age of Onset  . Heart disease Mother   . Hip fracture Mother 52  . Osteoporosis Mother   . Dementia Mother 28  . Arthritis Father   . Heart disease Father   . Arthritis Sister   . Arthritis Brother   . Colon cancer Neg Hx    Social History   Socioeconomic History  . Marital status: Married    Spouse name: Wells Guiles  . Number of  children: 2  . Years of education: 39  . Highest education level: Some college, no degree  Occupational History  . 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 Smoking status: Former Smoker    Packs/day: 4.00    Years: 25.00    Pack years: 100.00    Types: Cigarettes    Quit date: 06/23/1982    Years since quitting: 37.9  . Smokeless tobacco: Never Used  . 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 Vaping Use: Never used  Substance and Sexual Activity  . Alcohol use: Yes    Alcohol/week: 5.0 standard drinks    Types: 5 Shots of liquor per week    Comment: brandy a few times a month, beer once a month  . Drug use: No  . Sexual activity: Yes    Birth control/protection: Surgical  Other Topics Concern  . Not on file  Social History Narrative  . Not on file   Social Determinants of Health   Financial Resource Strain: Not on file  Food Insecurity: Not on file  Transportation Needs: Not on file  Physical Activity: Not on file  Stress: Not on file  Social Connections: Not on file   Past Surgical History:  Procedure Laterality Date  . bilateral  inguinal hernia    . CARDIAC CATHETERIZATION Bilateral 02/21/2020  . CHEST TUBE INSERTION N/A 03/22/2020   Procedure: CHEST TUBE INSERTION OF 28 BLAKE DRAIN.;  Surgeon: Grace Isaac, MD;  Location: [facility] OR;  Service: Thoracic;  Laterality: N/A;  . COLONOSCOPY  06/09/2004   [id number] colon diverticulosis, otherwise normal colonoscopy  . EYE SURGERY  08/2014  . HERNIA REPAIR    . MITRAL VALVE REPAIR N/A 03/11/2020   Procedure: MITRAL VALVE REPAIR (MVR) USING MEMO 4D SIZE 32 RING;  Surgeon: Rexene Alberts, MD;  Location: [place];  Service: Open Heart Surgery;  Laterality: N/A;  . PALATE / UVULA BIOPSY / EXCISION    . PERICARDIAL WINDOW N/A 03/22/2020   Procedure: SUBXYPHOID POST-OP PERICARDIAL FLUID DRAINAGE WITH CHEST TUBE INSERTION.;  Surgeon: Grace Isaac, MD;   Location: [place];  Service: Thoracic;  Laterality: N/A;  . REPAIR OF ACUTE ASCENDING THORACIC AORTIC DISSECTION N/A 03/11/2020   Procedure: REPAIR OF ACUTE ASCENDING THORACIC AORTIC DISSECTION USING A HEMASHIELD PLATINUM 26MM STRAIGHT GRAFT AND A HEASHIELD PLATINUM 28X10MM SINGLE ARM GRAFT;  Surgeon: Rexene Alberts, MD;  Location: [place];  Service: Open Heart Surgery;  Laterality: N/A;  . RIGHT/LEFT HEART CATH AND CORONARY ANGIOGRAPHY N/A 02/21/2020   Procedure: RIGHT/LEFT HEART CATH AND CORONARY ANGIOGRAPHY;  Surgeon: Troy Sine, MD;  Location: [place] [facility];  Service: Cardiovascular;  Laterality: N/A;  . TEE WITHOUT CARDIOVERSION N/A 12/19/2019   Procedure: TRANSESOPHAGEAL ECHOCARDIOGRAM (TEE);  Surgeon: Buford Dresser, MD;  Location: [facility] ENDOSCOPY;  Service: Cardiovascular;  Laterality: N/A;  . TEE WITHOUT CARDIOVERSION N/A 03/11/2020   Procedure: TRANSESOPHAGEAL ECHOCARDIOGRAM (TEE);  Surgeon: Rexene Alberts, MD;  Location: [place];  Service: Open Heart Surgery;  Laterality: N/A;  . TEE WITHOUT CARDIOVERSION N/A 03/22/2020   Procedure: TRANSESOPHAGEAL ECHOCARDIOGRAM (TEE);  Surgeon: Grace Isaac, MD;  Location: [facility] OR;  Service: Thoracic;  Laterality: N/A;  . TONSILLECTOMY    . VASECTOMY     Past Medical History:  Diagnosis Date  . Acute thoracic aortic dissection (HCC) 03/11/2020   Type A  . Allergy   . Asthma   . Cataract   . Cataract   . Diverticulitis   . Dyspnea   . GERD (gastroesophageal reflux disease)   . Heart murmur 12/2019  . Hyperlipidemia   . Hypertension   . Inguinal hernia bilateral, non-recurrent   . S/P aortic dissection repair 03/11/2020   supracoronary straight graft repair with resuspension of native aortic valve and open distal anastomosis for intraoperative acute type A aortic dissection   . S/P mitral valve repair 03/11/2020   Complex valvuloplasty including artificial Gore-tex neochord placement x 4, suture plication of posterior leaflet and  posteromedial commissure and 32 mm Sorin Memo 4D ring annuloplasty  . Sleep apnea    BP 109/77   Pulse 72   Temp 97.7 F (36.5 C)   Ht 5\' 10"  (1.778 m)   Wt 180 lb 9.6 oz (81.9 kg)   SpO2 97%   BMI 25.91 kg/m   Opioid Risk Score:   Fall Risk Score:  `1  Depression screen PHQ 2/9  Depression screen [facility] 2/9 06/02/2020 05/08/2020 11/14/2019 09/11/2019 05/17/2019 01/29/2019 08/28/2018  Decreased Interest 0 0 0 0 0 0 0  Down, Depressed, Hopeless 0 0 0 0 0 0 0  PHQ - 2 Score 0 0 0 0 0 0 0  Altered sleeping 1 - - - - - -  Tired, decreased energy 1 - - - - - -  Change in appetite 0 - - - - - -  Feeling bad or failure about yourself  0 - - - - - -  Trouble concentrating 0 - - - - - -  Moving slowly or fidgety/restless 0 - - - - - -  Suicidal thoughts 0 - - - - - -  PHQ-9 Score 2 - - - - - -  Difficult doing work/chores Not difficult at all - - - - - -  Some recent data might be hidden    Review of Systems  Constitutional: Negative.   HENT: Negative.   Eyes: Negative.   Respiratory: Negative.   Cardiovascular: Positive for chest pain.  Gastrointestinal: Negative.   Endocrine: Negative.   Genitourinary:       Bladder control  Musculoskeletal:       Chest  Skin: Negative.   Allergic/Immunologic: Negative.   Neurological: Negative.   Hematological: Bruises/bleeds easily.       Warfarin  Psychiatric/Behavioral: Negative.   All other systems reviewed and are negative.      Objective:   Physical Exam  Constitutional: No distress . Vital signs reviewed. HENT: Normocephalic.  Atraumatic. Eyes: EOMI. No discharge. Cardiovascular: No JVD.   Respiratory: Normal effort.  No stridor.   GI: Non-distended.   Skin: Warm and dry.  Intact. Psych: Normal mood.  Normal behavior. Musc: No edema in extremities.  No tenderness in extremities. Neuro: Alert  Motor: Bilateral upper extremities: 4+/5 proximal distal, improving Bilateral lower extremities: 4+/5 proximal distal, improving     Assessment & Plan:  Male with history of HTN severe OSA, MVP with severe regurgitation, mild SOB and non-exertional CP who was admitted on 03/11/20 for MVR and repair of acute type A aortic dissection by Dr. Roxy Manns complicated by Afib presents for follow up debility.   1.  Impaired mobility secondary to mitral valve repair on 03/11/2020 with pericardial effusion on 90/21/1155, further complicated by atrial fibrillation.   Initiated cardia rehab per Cards  2.  Fluid overload:   Improved  3. Hypothyroid:   Resolved per patient, no longer taking medication  4. Acute kidney injury:    States he had blood work done recently, encouraged follow up regarding results  5. OSA:   Encouraged compliance with CPAP  6.  Orthostatic pressure  Encouraged water intake   Encourage positional changes   Meds reviewed Referrals reviewed All questions answered

## 2020-06-04 DIAGNOSIS — M9905 Segmental and somatic dysfunction of pelvic region: Secondary | ICD-10-CM | POA: Diagnosis not present

## 2020-06-04 DIAGNOSIS — M9904 Segmental and somatic dysfunction of sacral region: Secondary | ICD-10-CM | POA: Diagnosis not present

## 2020-06-04 DIAGNOSIS — M5137 Other intervertebral disc degeneration, lumbosacral region: Secondary | ICD-10-CM | POA: Diagnosis not present

## 2020-06-04 DIAGNOSIS — M9903 Segmental and somatic dysfunction of lumbar region: Secondary | ICD-10-CM | POA: Diagnosis not present

## 2020-06-08 ENCOUNTER — Ambulatory Visit (INDEPENDENT_AMBULATORY_CARE_PROVIDER_SITE_OTHER): Payer: Medicare Other

## 2020-06-08 ENCOUNTER — Other Ambulatory Visit: Payer: Self-pay

## 2020-06-08 DIAGNOSIS — Z5181 Encounter for therapeutic drug level monitoring: Secondary | ICD-10-CM

## 2020-06-08 DIAGNOSIS — Z9889 Other specified postprocedural states: Secondary | ICD-10-CM

## 2020-06-08 DIAGNOSIS — I7101 Dissection of thoracic aorta: Secondary | ICD-10-CM

## 2020-06-08 DIAGNOSIS — I71019 Dissection of thoracic aorta, unspecified: Secondary | ICD-10-CM

## 2020-06-08 LAB — POCT INR: INR: 1.7 — AB (ref 2.0–3.0)

## 2020-06-08 NOTE — Patient Instructions (Signed)
Take 2 tablets tonight only and then increase to 0.5 tablet daily except 1 tablet on Monday, Wednesday and Friday Recheck in 3 weeks

## 2020-06-09 ENCOUNTER — Other Ambulatory Visit: Payer: Self-pay | Admitting: Family Medicine

## 2020-06-10 ENCOUNTER — Telehealth (HOSPITAL_COMMUNITY): Payer: Self-pay | Admitting: *Deleted

## 2020-06-11 ENCOUNTER — Other Ambulatory Visit: Payer: Self-pay

## 2020-06-11 ENCOUNTER — Encounter (HOSPITAL_COMMUNITY)
Admission: RE | Admit: 2020-06-11 | Discharge: 2020-06-11 | Disposition: A | Discharge status: Home / Self Care | Payer: Medicare Other | Source: Ambulatory Visit | Attending: Cardiovascular Disease | Admitting: Cardiovascular Disease

## 2020-06-11 ENCOUNTER — Telehealth (HOSPITAL_COMMUNITY): Payer: Self-pay

## 2020-06-11 ENCOUNTER — Encounter (HOSPITAL_COMMUNITY): Payer: Self-pay

## 2020-06-11 VITALS — BP 102/70 | Ht 69.25 in | Wt 179.5 lb

## 2020-06-11 DIAGNOSIS — Z9889 Other specified postprocedural states: Secondary | ICD-10-CM | POA: Insufficient documentation

## 2020-06-11 NOTE — Progress Notes (Signed)
Cardiac Individual Treatment Plan  Patient Details  Name: Patrick Bell MRN: [id number] Date of Birth: February 29, 1941 Referring Provider:   Flowsheet Row CARDIAC REHAB PHASE II ORIENTATION from 06/11/2020 in [place]  Referring Provider Dr. Shelva Majestic MD      Initial Encounter Date:  [place] from 06/11/2020 in [place]  Date 06/11/20      Visit Diagnosis: 03/11/20 MV Repair with aortic dissection Repair  Patient's Home Medications on Admission:  Current Outpatient Medications:  .  acetaminophen (TYLENOL) 325 MG tablet, Take 1-2 tablets (325-650 mg total) by mouth every 4 (four) hours as needed for mild pain., Disp: , Rfl:  .  albuterol (PROAIR HFA) 108 (90 Base) MCG/ACT inhaler, Inhale 2 puffs into the lungs every 6 (six) hours as needed for wheezing., Disp: 8.5 g, Rfl: 2 .  hydroxypropyl methylcellulose / hypromellose (ISOPTO TEARS / GONIOVISC) 2.5 % ophthalmic solution, Place 1-2 drops into both eyes 3 (three) times daily as needed (dry/irritated eyes.)., Disp: , Rfl:  .  levothyroxine (SYNTHROID) 50 MCG tablet, Take 1 tablet (50 mcg total) by mouth daily., Disp: 90 tablet, Rfl: 3 .  metoprolol succinate (TOPROL-XL) 25 MG 24 hr tablet, TAKE 1 TABLET DAILY, Disp: 30 tablet, Rfl: 0 .  montelukast (SINGULAIR) 10 MG tablet, Take 1 tablet (10 mg total) daily by mouth., Disp: 90 tablet, Rfl: 3 .  Multiple Vitamin (MULTIVITAMIN WITH MINERALS) TABS tablet, Take 1 tablet by mouth daily., Disp: , Rfl:  .  Olopatadine HCl 0.2 % SOLN, Place 1 drop into both eyes daily as needed (allergies)., Disp: , Rfl:  .  pantoprazole (PROTONIX) 40 MG tablet, Take 1 tablet (40 mg total) daily by mouth., Disp: 90 tablet, Rfl: 3 .  simvastatin (ZOCOR) 10 MG tablet, Take 1 tablet (10 mg total) by mouth at bedtime., Disp: 90 tablet, Rfl: 3 .  warfarin (COUMADIN) 5 MG tablet, Take 1 tablet (5 mg total) by  mouth daily at 4 PM., Disp: 30 tablet, Rfl: 3  Past Medical History: Past Medical History:  Diagnosis Date  . Acute thoracic aortic dissection (HCC) 03/11/2020   Type A  . Allergy   . Asthma   . Cataract   . Cataract   . Diverticulitis   . Dyspnea   . GERD (gastroesophageal reflux disease)   . Heart murmur 12/2019  . Hyperlipidemia   . Hypertension   . Inguinal hernia bilateral, non-recurrent   . S/P aortic dissection repair 03/11/2020   supracoronary straight graft repair with resuspension of native aortic valve and open distal anastomosis for intraoperative acute type A aortic dissection   . S/P mitral valve repair 03/11/2020   Complex valvuloplasty including artificial Gore-tex neochord placement x 4, suture plication of posterior leaflet and posteromedial commissure and 32 mm Sorin Memo 4D ring annuloplasty  . Sleep apnea     Tobacco Use: Social History   Tobacco Use  Smoking Status Former Smoker  . Packs/day: 4.00  . Years: 25.00  . Pack years: 100.00  . Types: Cigarettes  . Quit date: 06/23/1982  . Years since quitting: 37.9  Smokeless Tobacco Never Used  Tobacco Comment   pt was a truck Oceanographer: Recent Review [place] for ITP Cardiac and Pulmonary Rehab Latest Ref Rng & Units 03/12/2020 03/12/2020 03/13/2020 03/14/2020 03/15/2020   Cholestrol 100 - 199 mg/dL - - - - -  LDLCALC 0 - 99 mg/dL - - - - -   HDL >39 mg/dL - - - - -   Trlycerides 0 - 149 mg/dL - - - - -   Hemoglobin A1c 4.8 - 5.6 % - - - - -   PHART 7.350 - 7.450 7.373 - - - -   PCO2ART 32.0 - 48.0 mmHg 44.6 - - - -   HCO3 20.0 - 28.0 mmol/L 26.1 - - - -   TCO2 22 - 32 mmol/L 27 23 - - -   ACIDBASEDEF 0.0 - 2.0 mmol/L - - - - -   O2SAT % 100.0 - 66.8 68.9 68.9      Capillary Blood Glucose: Lab Results  Component Value Date   GLUCAP 79 03/31/2020   GLUCAP 93 03/30/2020   GLUCAP 83 03/30/2020   GLUCAP 91 03/30/2020   GLUCAP 142 (H) 03/29/2020     Exercise Target  Goals: Exercise Program Goal: Individual exercise prescription set using results from initial 6 min walk test and THRR while considering  patient's activity barriers and safety.   Exercise Prescription Goal: Starting with aerobic activity 30 plus minutes a day, 3 days per week for initial exercise prescription. Provide home exercise prescription and guidelines that participant acknowledges understanding prior to discharge.  Activity Barriers & Risk Stratification:  Activity Barriers & Cardiac Risk Stratification - 06/11/20 1305      Activity Barriers & Cardiac Risk Stratification   Activity Barriers Balance Concerns;Neck/Spine Problems   Balance: 8 seconds on single leg stand. Spine: chronic Low back pain   Cardiac Risk Stratification High   below 5 MET's          6 Minute Walk:  6 Minute Walk    Row Name 06/11/20 1220         6 Minute Walk   Phase Initial     Distance 1234 feet     Walk Time 6 minutes     # of Rest Breaks 0     MPH 2.34     METS 2.14     RPE 10     Perceived Dyspnea  0     VO2 Peak 7.52     Symptoms No     Resting HR 77 bpm     Resting BP 102/70     Resting Oxygen Saturation  98 %     Exercise Oxygen Saturation  during 6 min walk 99 %     Max Ex. HR 81 bpm     Max Ex. BP 128/84     2 Minute Post BP 126/78            Oxygen Initial Assessment:   Oxygen Re-Evaluation:   Oxygen Discharge (Final Oxygen Re-Evaluation):   Initial Exercise Prescription:  Initial Exercise Prescription - 06/11/20 1300      Date of Initial Exercise RX and Referring Provider   Date 06/11/20    Referring Provider Dr. Shelva Majestic MD    Expected Discharge Date 08/07/20      Recumbant Bike   Level 1.5    RPM 60    Minutes 15    METs 1.5      NuStep   Level 2    SPM 80    Minutes 15    METs 2      Prescription Details   Frequency (times per week) 3    Duration Progress to 30 minutes of continuous aerobic without signs/symptoms of physical distress  Intensity   THRR 40-80% of Max Heartrate 56-112    Ratings of Perceived Exertion 11-13    Perceived Dyspnea 0-4      Progression   Progression Continue progressive overload as per policy without signs/symptoms or physical distress.      Resistance Training   Training Prescription Yes    Weight 3    Reps 10-15           Perform Capillary Blood Glucose checks as needed.  Exercise Prescription Changes:   Exercise Comments:   Exercise Goals and Review:  Exercise Goals    Row Name 06/11/20 1316             Exercise Goals   Increase Physical Activity Yes       Intervention Provide advice, education, support and counseling about physical activity/exercise needs.;Develop an individualized exercise prescription for aerobic and resistive training based on initial evaluation findings, risk stratification, comorbidities and participant's personal goals.       Expected Outcomes Short Term: Attend rehab on a regular basis to increase amount of physical activity.;Long Term: Add in home exercise to make exercise part of routine and to increase amount of physical activity.;Long Term: Exercising regularly at least 3-5 days a week.       Increase Strength and Stamina Yes       Intervention Provide advice, education, support and counseling about physical activity/exercise needs.;Develop an individualized exercise prescription for aerobic and resistive training based on initial evaluation findings, risk stratification, comorbidities and participant's personal goals.       Expected Outcomes Short Term: Increase workloads from initial exercise prescription for resistance, speed, and METs.;Short Term: Perform resistance training exercises routinely during rehab and add in resistance training at home;Long Term: Improve cardiorespiratory fitness, muscular endurance and strength as measured by increased METs and functional capacity (6MWT)       Able to understand and use rate of perceived exertion  (RPE) scale Yes       Intervention Provide education and explanation on how to use RPE scale       Expected Outcomes Short Term: Able to use RPE daily in rehab to express subjective intensity level;Long Term:  Able to use RPE to guide intensity level when exercising independently       Knowledge and understanding of Target Heart Rate Range (THRR) Yes       Intervention Provide education and explanation of THRR including how the numbers were predicted and where they are located for reference       Expected Outcomes Short Term: Able to state/look up THRR;Long Term: Able to use THRR to govern intensity when exercising independently;Short Term: Able to use daily as guideline for intensity in rehab       Understanding of Exercise Prescription Yes       Intervention Provide education, explanation, and written materials on patient's individual exercise prescription       Expected Outcomes Short Term: Able to explain program exercise prescription;Long Term: Able to explain home exercise prescription to exercise independently              Exercise Goals Re-Evaluation :    Discharge Exercise Prescription (Final Exercise Prescription Changes):   Nutrition:  Target Goals: Understanding of nutrition guidelines, daily intake of sodium <1555m, cholesterol <2033m calories 30% from fat and 7% or less from saturated fats, daily to have 5 or more servings of fruits and vegetables.  Biometrics:   Post Biometrics - 06/11/20 1000  Post  Biometrics   Waist Circumference 40 inches    Hip Circumference 42.25 inches    Waist to Hip Ratio 0.95 %    Triceps Skinfold 17 mm    % Body Fat 27.9 %    Grip Strength 39 kg    Flexibility 0 in   pt unable to reach   Single Leg Stand 8 seconds           Nutrition Therapy Plan and Nutrition Goals:   Nutrition Assessments:  MEDIFICTS Score Key:  ?70 Need to make dietary changes   40-70 Heart Healthy Diet  ? 40 Therapeutic Level Cholesterol  Diet   Picture Your Plate Scores:  <53 Unhealthy dietary pattern with much room for improvement.  41-50 Dietary pattern unlikely to meet recommendations for good health and room for improvement.  51-60 More healthful dietary pattern, with some room for improvement.   >60 Healthy dietary pattern, although there may be some specific behaviors that could be improved.    Nutrition Goals Re-Evaluation:   Nutrition Goals Discharge (Final Nutrition Goals Re-Evaluation):   Psychosocial: Target Goals: Acknowledge presence or absence of significant depression and/or stress, maximize coping skills, provide positive support system. Participant is able to verbalize types and ability to use techniques and skills needed for reducing stress and depression.  Initial Review & Psychosocial Screening:  Initial Psych Review & Screening - 06/11/20 1544      Initial Review   Current issues with None Identified      Family Dynamics   Good Support System? Yes   Ray has his wife and his wife's family for support     Barriers   Psychosocial barriers to participate in program There are no identifiable barriers or psychosocial needs.      Screening Interventions   Interventions Encouraged to exercise           Quality of Life Scores:  Quality of Life - 06/11/20 1321      Quality of Life   Select Quality of Life      Quality of Life Scores   Health/Function Pre 27.37 %    Socioeconomic Pre 28.5 %    Psych/Spiritual Pre 27.86 %    Family Pre 27.1 %    GLOBAL Pre 27.61 %          Scores of 19 and below usually indicate a poorer quality of life in these areas.  A difference of  2-3 points is a clinically meaningful difference.  A difference of 2-3 points in the total score of the Quality of Life Index has been associated with significant improvement in overall quality of life, self-image, physical symptoms, and general health in studies assessing change in quality of life.  PHQ-9: Recent  Review Flowsheet Data    Depression screen [facility] 2/9 06/11/2020 06/02/2020 05/08/2020 11/14/2019 09/11/2019   Decreased Interest 0 0 0 0 0   Down, Depressed, Hopeless - 0 0 0 0   PHQ - 2 Score 0 0 0 0 0   Altered sleeping 0 1 - - -   Tired, decreased energy - 1 - - -   Change in appetite - 0 - - -   Feeling bad or failure about yourself  - 0 - - -   Trouble concentrating - 0 - - -   Moving slowly or fidgety/restless - 0 - - -   Suicidal thoughts - 0 - - -   PHQ-9 Score 0 2 - - -  Difficult doing work/chores - Not difficult at all - - -     Interpretation of Total Score  Total Score Depression Severity:  1-4 = Minimal depression, 5-9 = Mild depression, 10-14 = Moderate depression, 15-19 = Moderately severe depression, 20-27 = Severe depression   Psychosocial Evaluation and Intervention:   Psychosocial Re-Evaluation:   Psychosocial Discharge (Final Psychosocial Re-Evaluation):   Vocational Rehabilitation: Provide vocational rehab assistance to qualifying candidates.   Vocational Rehab Evaluation & Intervention:  Vocational Rehab - 06/11/20 1545      Initial Vocational Rehab Evaluation & Intervention   Assessment shows need for Vocational Rehabilitation No   Ray is retired and does not need vocatinal rehab at this time          Education: Education Goals: Education classes will be provided on a weekly basis, covering required topics. Participant will state understanding/return demonstration of topics presented.  Learning Barriers/Preferences:  Learning Barriers/Preferences - 06/11/20 1324      Learning Barriers/Preferences   Learning Barriers Exercise Concerns;Hearing   Balance concerns: dizziness   Learning Preferences Written Material;Video;Computer/Internet;Pictoral           Education Topics: Hypertension, Hypertension Reduction -Define heart disease and high blood pressure. Discus how high blood pressure affects the body and ways to reduce high blood  pressure.   Exercise and Your Heart -Discuss why it is important to exercise, the FITT principles of exercise, normal and abnormal responses to exercise, and how to exercise safely.   Angina -Discuss definition of angina, causes of angina, treatment of angina, and how to decrease risk of having angina.   Cardiac Medications -Review what the following cardiac medications are used for, how they affect the body, and side effects that may occur when taking the medications.  Medications include Aspirin, Beta blockers, calcium channel blockers, ACE Inhibitors, angiotensin receptor blockers, diuretics, digoxin, and antihyperlipidemics.   Congestive Heart Failure -Discuss the definition of CHF, how to live with CHF, the signs and symptoms of CHF, and how keep track of weight and sodium intake.   Heart Disease and Intimacy -Discus the effect sexual activity has on the heart, how changes occur during intimacy as we age, and safety during sexual activity.   Smoking Cessation / COPD -Discuss different methods to quit smoking, the health benefits of quitting smoking, and the definition of COPD.   Nutrition I: Fats -Discuss the types of cholesterol, what cholesterol does to the heart, and how cholesterol levels can be controlled.   Nutrition II: Labels -Discuss the different components of food labels and how to read food label   Heart Parts/Heart Disease and PAD -Discuss the anatomy of the heart, the pathway of blood circulation through the heart, and these are affected by heart disease.   Stress I: Signs and Symptoms -Discuss the causes of stress, how stress may lead to anxiety and depression, and ways to limit stress.   Stress II: Relaxation -Discuss different types of relaxation techniques to limit stress.   Warning Signs of Stroke / TIA -Discuss definition of a stroke, what the signs and symptoms are of a stroke, and how to identify when someone is having stroke.   Knowledge  Questionnaire Score:  Knowledge Questionnaire Score - 06/11/20 1323      Knowledge Questionnaire Score   Pre Score 17/24           Core Components/Risk Factors/Patient Goals at Admission:  Personal Goals and Risk Factors at Admission - 06/11/20 1329      Core Components/Risk  Factors/Patient Goals on Admission    Weight Management Weight Maintenance;Yes    Intervention Weight Management: Develop a combined nutrition and exercise program designed to reach desired caloric intake, while maintaining appropriate intake of nutrient and fiber, sodium and fats, and appropriate energy expenditure required for the weight goal.;Weight Management: Provide education and appropriate resources to help participant work on and attain dietary goals.    Hypertension Yes    Intervention Provide education on lifestyle modifcations including regular physical activity/exercise, weight management, moderate sodium restriction and increased consumption of fresh fruit, vegetables, and low fat dairy, alcohol moderation, and smoking cessation.;Monitor prescription use compliance.    Expected Outcomes Short Term: Continued assessment and intervention until BP is < 140/59m HG in hypertensive participants. < 130/831mHG in hypertensive participants with diabetes, heart failure or chronic kidney disease.;Long Term: Maintenance of blood pressure at goal levels.    Lipids Yes    Intervention Provide education and support for participant on nutrition & aerobic/resistive exercise along with prescribed medications to achieve LDL <706mHDL >56m68m  Expected Outcomes Short Term: Participant states understanding of desired cholesterol values and is compliant with medications prescribed. Participant is following exercise prescription and nutrition guidelines.;Long Term: Cholesterol controlled with medications as prescribed, with individualized exercise RX and with personalized nutrition plan. Value goals: LDL < 70mg49mL > 40 mg.     Personal Goal Other Yes    Personal Goal Pt wants to regain strength in legs and heart health           Core Components/Risk Factors/Patient Goals Review:    Core Components/Risk Factors/Patient Goals at Discharge (Final Review):    ITP Comments:  ITP Comments    Row Name 06/11/20 1537           ITP Comments Dr TraciFransico HimMedical Director              Comments: Ray  attended orientation on 06/11/2020 to review rules and guidelines for program.  Completed 6 minute walk test, Intitial ITP, and exercise prescription.  VSS. Telemetry-Sinus Rhythm occasional PVC.  Asymptomatic. Safety measures and social distancing in place per CDC guidelines.MariaBarnet PallBSN 06/11/2020 3:51 PM

## 2020-06-11 NOTE — Telephone Encounter (Signed)
Returned pt phone call, LMTCB. °

## 2020-06-12 ENCOUNTER — Ambulatory Visit
Admission: RE | Admit: 2020-06-12 | Discharge: 2020-06-12 | Disposition: A | Discharge status: Home / Self Care | Payer: Medicare Other | Source: Ambulatory Visit | Attending: Thoracic Surgery (Cardiothoracic Vascular Surgery) | Admitting: Thoracic Surgery (Cardiothoracic Vascular Surgery)

## 2020-06-12 DIAGNOSIS — K5730 Diverticulosis of large intestine without perforation or abscess without bleeding: Secondary | ICD-10-CM | POA: Diagnosis not present

## 2020-06-12 DIAGNOSIS — I7772 Dissection of iliac artery: Secondary | ICD-10-CM | POA: Diagnosis not present

## 2020-06-12 DIAGNOSIS — Z9889 Other specified postprocedural states: Secondary | ICD-10-CM

## 2020-06-12 DIAGNOSIS — M4186 Other forms of scoliosis, lumbar region: Secondary | ICD-10-CM | POA: Diagnosis not present

## 2020-06-12 DIAGNOSIS — I7101 Dissection of thoracic aorta: Secondary | ICD-10-CM | POA: Diagnosis not present

## 2020-06-12 DIAGNOSIS — I7102 Dissection of abdominal aorta: Secondary | ICD-10-CM | POA: Diagnosis not present

## 2020-06-12 MED ORDER — IOPAMIDOL (ISOVUE-370) INJECTION 76%
75.0000 mL | Freq: Once | INTRAVENOUS | Status: AC | PRN
  Administered 2020-06-12: 75 mL via INTRAVENOUS

## 2020-06-15 ENCOUNTER — Other Ambulatory Visit: Payer: Self-pay

## 2020-06-15 ENCOUNTER — Encounter (HOSPITAL_COMMUNITY)
Admission: RE | Admit: 2020-06-15 | Discharge: 2020-06-15 | Disposition: A | Discharge status: Home / Self Care | Payer: Medicare Other | Source: Ambulatory Visit | Attending: Cardiovascular Disease | Admitting: Cardiovascular Disease

## 2020-06-15 ENCOUNTER — Encounter: Payer: Medicare Other | Admitting: Thoracic Surgery (Cardiothoracic Vascular Surgery)

## 2020-06-15 DIAGNOSIS — Z9889 Other specified postprocedural states: Secondary | ICD-10-CM

## 2020-06-15 NOTE — Progress Notes (Signed)
Daily Session Note  Patient Details  Name: Patrick Bell MRN: [id number] Date of Birth: 02-Sep-1940 Referring Provider:   Flowsheet Row CARDIAC REHAB PHASE II ORIENTATION from 06/11/2020 in [place]  Referring Provider Dr. Shelva Majestic MD      Encounter Date: 06/15/2020  Check In:  Session Check In - 06/15/20 1156      Check-In   Supervising physician immediately available to respond to emergencies [facility] immediately available    Physician(s) Dr. Arbutus Ped    Location [facility]    Staff Present Barnet Pall, RN, Milus Glazier, MS, EP-C, CCRP;Other;Olinty Celesta Aver, MS, ACSM CEP, Exercise Physiologist;Carlette Wilber Oliphant, RN, BSN    Virtual Visit No    Medication changes reported     No    Fall or balance concerns reported    No    Tobacco Cessation No Change    Warm-up and Cool-down Performed on first and last piece of equipment    Resistance Training Performed Yes    VAD Patient? No    PAD/SET Patient? No      Pain Assessment   Currently in Pain? No/denies    Pain Score 0-No pain    Multiple Pain Sites No           Capillary Blood Glucose: No results found for this or any previous visit (from the past 24 hour(s)).   Exercise Prescription Changes - 06/15/20 1105      Response to Exercise   Blood Pressure (Admit) 98/62    Blood Pressure (Exercise) 170/80    Blood Pressure (Exit) 127/83    Heart Rate (Admit) 82 bpm    Heart Rate (Exercise) 94 bpm    Heart Rate (Exit) 86 bpm    Rating of Perceived Exertion (Exercise) 11    Symptoms none    Comments Off to a good start with exercise.    Duration Continue with 30 min of aerobic exercise without signs/symptoms of physical distress.    Intensity THRR unchanged      Progression   Progression Continue to progress workloads to maintain intensity without signs/symptoms of physical distress.    Average METs 1.7      Resistance Training   Training  Prescription Yes    Weight 3    Reps 10-15    Time 10 Minutes      Interval Training   Interval Training No      Recumbant Bike   Level 1.5    RPM 60    Minutes 15    METs 1.8      NuStep   Level 2    SPM 80    Minutes 15    METs 1.7           Social History   Tobacco Use  Smoking Status Former Smoker  . Packs/day: 4.00  . Years: 25.00  . Pack years: 100.00  . Types: Cigarettes  . Quit date: 06/23/1982  . Years since quitting: 38.0  Smokeless Tobacco Never Used  Tobacco Comment   pt was a Administrator    Goals Met:  Exercise tolerated well No report of cardiac concerns or symptoms Strength training completed today  Goals Unmet:  Not Applicable  Comments: Patrick Bell started cardiac rehab today.  Pt tolerated light exercise without difficulty. Systolic BP ranged from 98 systolic to 659/93 via automatic cuff on the nustep. Exit BP 127/83 , telemetry-Sinus Rhythm, asymptomatic.  Medication list reconciled. Pt denies barriers to  medicaiton compliance.  PSYCHOSOCIAL ASSESSMENT:  PHQ-0. Pt exhibits positive coping skills, hopeful outlook with supportive family. No psychosocial needs identified at this time, no psychosocial interventions necessary.    Pt enjoys watching TV and playing with his model railroad.   Pt oriented to exercise equipment and routine.    Understanding verbalized.Barnet Pall, RN,BSN 06/15/2020 4:26 PM   Dr. Fransico Him is Medical Director for Cardiac Rehab at [facility].

## 2020-06-17 ENCOUNTER — Encounter (HOSPITAL_COMMUNITY)
Admission: RE | Admit: 2020-06-17 | Discharge: 2020-06-17 | Disposition: A | Discharge status: Home / Self Care | Payer: Medicare Other | Source: Ambulatory Visit | Attending: Cardiovascular Disease | Admitting: Cardiovascular Disease

## 2020-06-17 ENCOUNTER — Other Ambulatory Visit: Payer: Self-pay

## 2020-06-17 DIAGNOSIS — Z9889 Other specified postprocedural states: Secondary | ICD-10-CM

## 2020-06-19 ENCOUNTER — Other Ambulatory Visit: Payer: Self-pay

## 2020-06-19 ENCOUNTER — Encounter (HOSPITAL_COMMUNITY)
Admission: RE | Admit: 2020-06-19 | Discharge: 2020-06-19 | Disposition: A | Discharge status: Home / Self Care | Payer: Medicare Other | Source: Ambulatory Visit | Attending: Cardiovascular Disease | Admitting: Cardiovascular Disease

## 2020-06-19 DIAGNOSIS — Z9889 Other specified postprocedural states: Secondary | ICD-10-CM

## 2020-06-22 ENCOUNTER — Other Ambulatory Visit: Payer: Self-pay

## 2020-06-22 ENCOUNTER — Encounter (HOSPITAL_COMMUNITY)
Admission: RE | Admit: 2020-06-22 | Discharge: 2020-06-22 | Disposition: A | Discharge status: Home / Self Care | Payer: Medicare Other | Source: Ambulatory Visit | Attending: Cardiovascular Disease | Admitting: Cardiovascular Disease

## 2020-06-22 DIAGNOSIS — Z9889 Other specified postprocedural states: Secondary | ICD-10-CM

## 2020-06-24 ENCOUNTER — Encounter (HOSPITAL_COMMUNITY)
Admission: RE | Admit: 2020-06-24 | Discharge: 2020-06-24 | Disposition: A | Discharge status: Home / Self Care | Payer: Medicare Other | Source: Ambulatory Visit | Attending: Cardiovascular Disease | Admitting: Cardiovascular Disease

## 2020-06-24 ENCOUNTER — Other Ambulatory Visit: Payer: Self-pay

## 2020-06-24 DIAGNOSIS — Z9889 Other specified postprocedural states: Secondary | ICD-10-CM

## 2020-06-24 NOTE — Progress Notes (Signed)
Patrick Bell 80 y.o. male Nutrition Note  Diagnosis:MV Repair with Repair of Aortic Dissection  Past Medical History:  Diagnosis Date  . Acute thoracic aortic dissection (HCC) 03/11/2020   Type A  . Allergy   . Asthma   . Cataract   . Cataract   . Diverticulitis   . Dyspnea   . GERD (gastroesophageal reflux disease)   . Heart murmur 12/2019  . Hyperlipidemia   . Hypertension   . Inguinal hernia bilateral, non-recurrent   . S/P aortic dissection repair 03/11/2020   supracoronary straight graft repair with resuspension of native aortic valve and open distal anastomosis for intraoperative acute type A aortic dissection   . S/P mitral valve repair 03/11/2020   Complex valvuloplasty including artificial Gore-tex neochord placement x 4, suture plication of posterior leaflet and posteromedial commissure and 32 mm Sorin Memo 4D ring annuloplasty  . Sleep apnea      Medications reviewed.   Current Outpatient Medications:  .  acetaminophen (TYLENOL) 325 MG tablet, Take 1-2 tablets (325-650 mg total) by mouth every 4 (four) hours as needed for mild pain., Disp: , Rfl:  .  albuterol (PROAIR HFA) 108 (90 Base) MCG/ACT inhaler, Inhale 2 puffs into the lungs every 6 (six) hours as needed for wheezing., Disp: 8.5 g, Rfl: 2 .  hydroxypropyl methylcellulose / hypromellose (ISOPTO TEARS / GONIOVISC) 2.5 % ophthalmic solution, Place 1-2 drops into both eyes 3 (three) times daily as needed (dry/irritated eyes.)., Disp: , Rfl:  .  levothyroxine (SYNTHROID) 50 MCG tablet, Take 1 tablet (50 mcg total) by mouth daily., Disp: 90 tablet, Rfl: 3 .  metoprolol succinate (TOPROL-XL) 25 MG 24 hr tablet, TAKE 1 TABLET DAILY, Disp: 30 tablet, Rfl: 0 .  montelukast (SINGULAIR) 10 MG tablet, Take 1 tablet (10 mg total) daily by mouth., Disp: 90 tablet, Rfl: 3 .  Multiple Vitamin (MULTIVITAMIN WITH MINERALS) TABS tablet, Take 1 tablet by mouth daily., Disp: , Rfl:  .  Olopatadine HCl 0.2 % SOLN, Place 1 drop into  both eyes daily as needed (allergies)., Disp: , Rfl:  .  pantoprazole (PROTONIX) 40 MG tablet, Take 1 tablet (40 mg total) daily by mouth., Disp: 90 tablet, Rfl: 3 .  simvastatin (ZOCOR) 10 MG tablet, Take 1 tablet (10 mg total) by mouth at bedtime., Disp: 90 tablet, Rfl: 3 .  warfarin (COUMADIN) 5 MG tablet, Take 1 tablet (5 mg total) by mouth daily at 4 PM., Disp: 30 tablet, Rfl: 3   Ht Readings from Last 1 Encounters:  06/11/20 5' 9.25" (1.759 m)     Wt Readings from Last 3 Encounters:  06/11/20 179 lb 7.3 oz (81.4 kg)  06/02/20 180 lb 9.6 oz (81.9 kg)  05/25/20 176 lb (79.8 kg)     There is no height or weight on file to calculate BMI.   Social History   Tobacco Use  Smoking Status Former Smoker  . Packs/day: 4.00  . Years: 25.00  . Pack years: 100.00  . Types: Cigarettes  . Quit date: 06/23/1982  . Years since quitting: 38.0  Smokeless Tobacco Never Used  Tobacco Comment   pt was a truck Visual merchandiser  Component Value Date   CHOL 184 11/14/2019   Lab Results  Component Value Date   HDL 70 11/14/2019   Lab Results  Component Value Date   LDLCALC 98 11/14/2019   Lab Results  Component Value Date   TRIG 87 11/14/2019  Lab Results  Component Value Date   HGBA1C 5.4 03/09/2020     CBG (last 3)  No results for input(s): GLUCAP in the last 72 hours.   Nutrition Note  Spoke with pt. Nutrition Plan and Nutrition Survey goals reviewed with pt. Pt is not following a Heart Healthy diet.  He is in contemplation stage of change. He has been discussing his diet with his wife and is interested in making changes to a more heart healthy diet.  Pt lost about 10 lbs after complicated hospital stay. He has gained 4 lbs back. His current weight is 178 lbs. His goal weight is 179 lbs.  He is eating 3 meals per day.    Per discussion, pt does use canned/convenience foods often. Pt does add salt to food. Pt does eat out frequently.   Pt taking coumadin. We  reviewed vitamin k intake. Pt has been avoiding vitamin k foods. Emphasized consistent intake.  Reviewed fat intake. Pt eats processed meats and read meats often.  Pt reports drinking 2 cans coke, 4 oz juice, 1 glass water, and 1-2 cups coffee daily.  He would like to increase water intake.   Pt expressed understanding of the information reviewed.    Nutrition Diagnosis ? Food-and nutrition-related knowledge deficit related to lack of exposure to information as related to diagnosis of: ? CVD ?   Nutrition Intervention ? Pt's individual nutrition plan reviewed with pt. ? Benefits of adopting Heart Healthy diet discussed when Picture Your Plate reviewed. ? Continue client-centered nutrition education by RD, as part of interdisciplinary care.  Goal(s) ? Pt to carry water bottle to increase water intake  ? Pt to reduce saturated fats by cutting back on processed and red meats ? Pt to eliminate salt shaker to reduce sodium intake   Plan:   Will provide client-centered nutrition education as part of interdisciplinary care  Monitor and evaluate progress toward nutrition goal with team.   Michaele Offer, MS, RDN, LDN

## 2020-06-24 NOTE — Progress Notes (Signed)
Reviewed home exercise guidelines with patient including endpoints, temperature precautions, target heart rate and rate of perceived exertion. Patient is currently walking 30 minutes daily as his mode of home exercise. Patient voices understanding of instructions given.  Sol Passer, MS, ACSM CEP

## 2020-06-26 ENCOUNTER — Other Ambulatory Visit: Payer: Self-pay

## 2020-06-26 ENCOUNTER — Encounter (HOSPITAL_COMMUNITY)
Admission: RE | Admit: 2020-06-26 | Discharge: 2020-06-26 | Disposition: A | Discharge status: Home / Self Care | Payer: Medicare Other | Source: Ambulatory Visit | Attending: Cardiovascular Disease | Admitting: Cardiovascular Disease

## 2020-06-26 DIAGNOSIS — Z9889 Other specified postprocedural states: Secondary | ICD-10-CM | POA: Diagnosis not present

## 2020-06-29 ENCOUNTER — Ambulatory Visit (INDEPENDENT_AMBULATORY_CARE_PROVIDER_SITE_OTHER): Payer: Medicare Other

## 2020-06-29 ENCOUNTER — Other Ambulatory Visit: Payer: Self-pay

## 2020-06-29 ENCOUNTER — Encounter (HOSPITAL_COMMUNITY)
Admission: RE | Admit: 2020-06-29 | Discharge: 2020-06-29 | Disposition: A | Discharge status: Home / Self Care | Payer: Medicare Other | Source: Ambulatory Visit | Attending: Cardiovascular Disease | Admitting: Cardiovascular Disease

## 2020-06-29 DIAGNOSIS — Z5181 Encounter for therapeutic drug level monitoring: Secondary | ICD-10-CM | POA: Diagnosis not present

## 2020-06-29 DIAGNOSIS — Z9889 Other specified postprocedural states: Secondary | ICD-10-CM | POA: Diagnosis not present

## 2020-06-29 DIAGNOSIS — I71019 Dissection of thoracic aorta, unspecified: Secondary | ICD-10-CM

## 2020-06-29 DIAGNOSIS — I7101 Dissection of thoracic aorta: Secondary | ICD-10-CM | POA: Diagnosis not present

## 2020-06-29 LAB — POCT INR: INR: 1.6 — AB (ref 2.0–3.0)

## 2020-06-29 NOTE — Patient Instructions (Signed)
Take 2 tablets tonight only and then increase to 1 tablet daily except 0.5 tablet on Tuesday and Thursday Recheck in 4 weeks

## 2020-06-29 NOTE — Progress Notes (Signed)
Cardiac Individual Treatment Plan  Patient Details  Name: Patrick Bell MRN: [id number] Date of Birth: [birth year] Referring Provider:   Flowsheet Row CARDIAC REHAB PHASE II ORIENTATION from 06/11/2020 in [place]  Referring Provider Dr. Shelva Majestic MD      Initial Encounter Date:  [place] from 06/11/2020 in [facility]  Date 06/11/20      Visit Diagnosis: 03/11/20 MV Repair with aortic dissection Repair  Patient's Home Medications on Admission:  Current Outpatient Medications:  .  acetaminophen (TYLENOL) 325 MG tablet, Take 1-2 tablets (325-650 mg total) by mouth every 4 (four) hours as needed for mild pain., Disp: , Rfl:  .  albuterol (PROAIR HFA) 108 (90 Base) MCG/ACT inhaler, Inhale 2 puffs into the lungs every 6 (six) hours as needed for wheezing., Disp: 8.5 g, Rfl: 2 .  hydroxypropyl methylcellulose / hypromellose (ISOPTO TEARS / GONIOVISC) 2.5 % ophthalmic solution, Place 1-2 drops into both eyes 3 (three) times daily as needed (dry/irritated eyes.)., Disp: , Rfl:  .  levothyroxine (SYNTHROID) 50 MCG tablet, Take 1 tablet (50 mcg total) by mouth daily., Disp: 90 tablet, Rfl: 3 .  metoprolol succinate (TOPROL-XL) 25 MG 24 hr tablet, TAKE 1 TABLET DAILY, Disp: 30 tablet, Rfl: 0 .  montelukast (SINGULAIR) 10 MG tablet, Take 1 tablet (10 mg total) daily by mouth., Disp: 90 tablet, Rfl: 3 .  Multiple Vitamin (MULTIVITAMIN WITH MINERALS) TABS tablet, Take 1 tablet by mouth daily., Disp: , Rfl:  .  Olopatadine HCl 0.2 % SOLN, Place 1 drop into both eyes daily as needed (allergies)., Disp: , Rfl:  .  pantoprazole (PROTONIX) 40 MG tablet, Take 1 tablet (40 mg total) daily by mouth., Disp: 90 tablet, Rfl: 3 .  simvastatin (ZOCOR) 10 MG tablet, Take 1 tablet (10 mg total) by mouth at bedtime., Disp: 90 tablet, Rfl: 3 .  warfarin (COUMADIN) 5 MG tablet, Take 1 tablet (5 mg total) by  mouth daily at 4 PM., Disp: 30 tablet, Rfl: 3  Past Medical History: Past Medical History:  Diagnosis Date  . Acute thoracic aortic dissection (HCC) 03/11/2020   Type A  . Allergy   . Asthma   . Cataract   . Cataract   . Diverticulitis   . Dyspnea   . GERD (gastroesophageal reflux disease)   . Heart murmur 12/2019  . Hyperlipidemia   . Hypertension   . Inguinal hernia bilateral, non-recurrent   . S/P aortic dissection repair 03/11/2020   supracoronary straight graft repair with resuspension of native aortic valve and open distal anastomosis for intraoperative acute type A aortic dissection   . S/P mitral valve repair 03/11/2020   Complex valvuloplasty including artificial Gore-tex neochord placement x 4, suture plication of posterior leaflet and posteromedial commissure and 32 mm Sorin Memo 4D ring annuloplasty  . Sleep apnea     Tobacco Use: Social History   Tobacco Use  Smoking Status Former Smoker  . Packs/day: 4.00  . Years: 25.00  . Pack years: 100.00  . Types: Cigarettes  . Quit date: 06/23/1982  . Years since quitting: 38.0  Smokeless Tobacco Never Used  Tobacco Comment   pt was a truck Oceanographer: Recent Review [facility] for [facility] Cardiac and Pulmonary Rehab Latest Ref Rng & Units 03/12/2020 03/12/2020 03/13/2020 03/14/2020 03/15/2020   Cholestrol 100 - 199 mg/dL - - - - -  LDLCALC 0 - 99 mg/dL - - - - -   HDL >39 mg/dL - - - - -   Trlycerides 0 - 149 mg/dL - - - - -   Hemoglobin A1c 4.8 - 5.6 % - - - - -   PHART 7.350 - 7.450 7.373 - - - -   PCO2ART 32.0 - 48.0 mmHg 44.6 - - - -   HCO3 20.0 - 28.0 mmol/L 26.1 - - - -   TCO2 22 - 32 mmol/L 27 23 - - -   ACIDBASEDEF 0.0 - 2.0 mmol/L - - - - -   O2SAT % 100.0 - 66.8 68.9 68.9      Capillary Blood Glucose: Lab Results  Component Value Date   GLUCAP 79 03/31/2020   GLUCAP 93 03/30/2020   GLUCAP 83 03/30/2020   GLUCAP 91 03/30/2020   GLUCAP 142 (H) 03/29/2020     Exercise Target  Goals: Exercise Program Goal: Individual exercise prescription set using results from initial 6 min walk test and THRR while considering  patient's activity barriers and safety.   Exercise Prescription Goal: Initial exercise prescription builds to 30-45 minutes a day of aerobic activity, 2-3 days per week.  Home exercise guidelines will be given to patient during program as part of exercise prescription that the participant will acknowledge.  Activity Barriers & Risk Stratification:  Activity Barriers & Cardiac Risk Stratification - 06/11/20 1305      Activity Barriers & Cardiac Risk Stratification   Activity Barriers Balance Concerns;Neck/Spine Problems   Balance: 8 seconds on single leg stand. Spine: chronic Low back pain   Cardiac Risk Stratification High   below 5 MET's          6 Minute Walk:  6 Minute Walk    Row Name 06/11/20 1220         6 Minute Walk   Phase Initial     Distance 1234 feet     Walk Time 6 minutes     # of Rest Breaks 0     MPH 2.34     METS 2.14     RPE 10     Perceived Dyspnea  0     VO2 Peak 7.52     Symptoms No     Resting HR 77 bpm     Resting BP 102/70     Resting Oxygen Saturation  98 %     Exercise Oxygen Saturation  during 6 min walk 99 %     Max Ex. HR 81 bpm     Max Ex. BP 128/84     2 Minute Post BP 126/78            Oxygen Initial Assessment:   Oxygen Re-Evaluation:   Oxygen Discharge (Final Oxygen Re-Evaluation):   Initial Exercise Prescription:  Initial Exercise Prescription - 06/11/20 1300      Date of Initial Exercise RX and Referring Provider   Date 06/11/20    Referring Provider Dr. Shelva Majestic MD    Expected Discharge Date 08/07/20      Recumbant Bike   Level 1.5    RPM 60    Minutes 15    METs 1.5      NuStep   Level 2    SPM 80    Minutes 15    METs 2      Prescription Details   Frequency (times per week) 3    Duration Progress to 30 minutes of continuous aerobic without  signs/symptoms of  physical distress      Intensity   THRR 40-80% of Max Heartrate 56-112    Ratings of Perceived Exertion 11-13    Perceived Dyspnea 0-4      Progression   Progression Continue progressive overload as per policy without signs/symptoms or physical distress.      Resistance Training   Training Prescription Yes    Weight 3    Reps 10-15           Perform Capillary Blood Glucose checks as needed.  Exercise Prescription Changes:   Exercise Prescription Changes    Row Name 06/15/20 1105 06/22/20 1059 06/29/20 1046         Response to Exercise   Blood Pressure (Admit) 98/62 130/60 148/70     Blood Pressure (Exercise) 170/80 134/62 138/62     Blood Pressure (Exit) 127/83 116/58 142/72     Heart Rate (Admit) 82 bpm 81 bpm 77 bpm     Heart Rate (Exercise) 94 bpm 85 bpm 84 bpm     Heart Rate (Exit) 86 bpm 82 bpm 81 bpm     Rating of Perceived Exertion (Exercise) 11 10 12      Symptoms none none none     Comments Off to a good start with exercise. -- --     Duration Continue with 30 min of aerobic exercise without signs/symptoms of physical distress. Continue with 30 min of aerobic exercise without signs/symptoms of physical distress. Continue with 30 min of aerobic exercise without signs/symptoms of physical distress.     Intensity THRR unchanged THRR unchanged THRR unchanged           Progression   Progression Continue to progress workloads to maintain intensity without signs/symptoms of physical distress. Continue to progress workloads to maintain intensity without signs/symptoms of physical distress. Continue to progress workloads to maintain intensity without signs/symptoms of physical distress.     Average METs 1.7 2.2 2.2           Resistance Training   Training Prescription Yes Yes Yes     Weight 3 3 3      Reps 10-15 10-15 10-15     Time 10 Minutes 10 Minutes 10 Minutes           Interval Training   Interval Training No No No           Recumbant Bike   Level 1.5 3  4      RPM 60 -- --     Minutes 15 15 15      METs 1.8 2.1 2.2           NuStep   Level 2 3 4      SPM 80 85 85     Minutes 15 15 15      METs 1.7 2.3 2.3           Home Exercise Plan   Plans to continue exercise at -- -- Home (comment)  Walking     Frequency -- -- Add 4 additional days to program exercise sessions.     Initial Home Exercises Provided -- -- 06/24/20            Exercise Comments:   Exercise Comments    Row Name 06/15/20 1211 06/24/20 1050 06/29/20 1141       Exercise Comments Patient tolerated first session of exercise well without symptoms. Reviewed home exercise guidelines, METs, and goals. Increased workload on NuStep and recumbent bike today.  Exercise Goals and Review:   Exercise Goals    Row Name 06/11/20 1316             Exercise Goals   Increase Physical Activity Yes       Intervention Provide advice, education, support and counseling about physical activity/exercise needs.;Develop an individualized exercise prescription for aerobic and resistive training based on initial evaluation findings, risk stratification, comorbidities and participant's personal goals.       Expected Outcomes Short Term: Attend rehab on a regular basis to increase amount of physical activity.;Long Term: Add in home exercise to make exercise part of routine and to increase amount of physical activity.;Long Term: Exercising regularly at least 3-5 days a week.       Increase Strength and Stamina Yes       Intervention Provide advice, education, support and counseling about physical activity/exercise needs.;Develop an individualized exercise prescription for aerobic and resistive training based on initial evaluation findings, risk stratification, comorbidities and participant's personal goals.       Expected Outcomes Short Term: Increase workloads from initial exercise prescription for resistance, speed, and METs.;Short Term: Perform resistance training exercises  routinely during rehab and add in resistance training at home;Long Term: Improve cardiorespiratory fitness, muscular endurance and strength as measured by increased METs and functional capacity (6MWT)       Able to understand and use rate of perceived exertion (RPE) scale Yes       Intervention Provide education and explanation on how to use RPE scale       Expected Outcomes Short Term: Able to use RPE daily in rehab to express subjective intensity level;Long Term:  Able to use RPE to guide intensity level when exercising independently       Knowledge and understanding of Target Heart Rate Range (THRR) Yes       Intervention Provide education and explanation of THRR including how the numbers were predicted and where they are located for reference       Expected Outcomes Short Term: Able to state/look up THRR;Long Term: Able to use THRR to govern intensity when exercising independently;Short Term: Able to use daily as guideline for intensity in rehab       Understanding of Exercise Prescription Yes       Intervention Provide education, explanation, and written materials on patient's individual exercise prescription       Expected Outcomes Short Term: Able to explain program exercise prescription;Long Term: Able to explain home exercise prescription to exercise independently              Exercise Goals Re-Evaluation :  Exercise Goals Re-Evaluation    Row Name 06/15/20 1211 06/24/20 1050           Exercise Goal Re-Evaluation   Exercise Goals Review Able to understand and use rate of perceived exertion (RPE) scale;Increase Physical Activity Able to understand and use rate of perceived exertion (RPE) scale;Increase Physical Activity;Understanding of Exercise Prescription;Able to check pulse independently;Increase Strength and Stamina;Knowledge and understanding of Target Heart Rate Range (THRR)      Comments Patient is able to understand and use RPE scale appropriately. Patient is currently  walking 30 minutes daily as his mode of home exercise. Patient stretches daily and has a total gym at home but hasn't used it because of back pain, per patient L4 & L5 are "collapsed". We discussed some of the weight exercises and stretches to avoid because they hurt his back. His goal is to build muscle and stamina  because now he gets tired quickly.      Expected Outcomes Progress workloads as tolerated to help achieve personal health and fitness goals. Continue to progress workloads as tolerated to help build strength and stamina.             Discharge Exercise Prescription (Final Exercise Prescription Changes):  Exercise Prescription Changes - 06/29/20 1046      Response to Exercise   Blood Pressure (Admit) 148/70    Blood Pressure (Exercise) 138/62    Blood Pressure (Exit) 142/72    Heart Rate (Admit) 77 bpm    Heart Rate (Exercise) 84 bpm    Heart Rate (Exit) 81 bpm    Rating of Perceived Exertion (Exercise) 12    Symptoms none    Duration Continue with 30 min of aerobic exercise without signs/symptoms of physical distress.    Intensity THRR unchanged      Progression   Progression Continue to progress workloads to maintain intensity without signs/symptoms of physical distress.    Average METs 2.2      Resistance Training   Training Prescription Yes    Weight 3    Reps 10-15    Time 10 Minutes      Interval Training   Interval Training No      Recumbant Bike   Level 4    Minutes 15    METs 2.2      NuStep   Level 4    SPM 85    Minutes 15    METs 2.3      Home Exercise Plan   Plans to continue exercise at Home (comment)   Walking   Frequency Add 4 additional days to program exercise sessions.    Initial Home Exercises Provided 06/24/20           Nutrition:  Target Goals: Understanding of nutrition guidelines, daily intake of sodium <1543m, cholesterol <2072m calories 30% from fat and 7% or less from saturated fats, daily to have 5 or more servings of  fruits and vegetables.  Biometrics:   Post Biometrics - 06/11/20 1000       Post  Biometrics   Waist Circumference 40 inches    Hip Circumference 42.25 inches    Waist to Hip Ratio 0.95 %    Triceps Skinfold 17 mm    % Body Fat 27.9 %    Grip Strength 39 kg    Flexibility 0 in   pt unable to reach   Single Leg Stand 8 seconds           Nutrition Therapy Plan and Nutrition Goals:  Nutrition Therapy & Goals - 06/24/20 1207      Nutrition Therapy   Diet TLC    Drug/Food Interactions Statins/Certain Fruits;Coumadin/Vit K      Personal Nutrition Goals   Nutrition Goal Pt to carry water bottle to increase water intake    Personal Goal #2 Pt to reduce saturated fats by cutting back on processed and red meats    Personal Goal #3 Pt to eliminate salt shaker to reduce sodium intake      Intervention Plan   Intervention Prescribe, educate and counsel regarding individualized specific dietary modifications aiming towards targeted core components such as weight, hypertension, lipid management, diabetes, heart failure and other comorbidities.;Nutrition handout(s) given to patient.    Expected Outcomes Short Term Goal: A plan has been developed with personal nutrition goals set during dietitian appointment.;Long Term Goal: Adherence to prescribed nutrition plan.  Nutrition Assessments:  MEDIFICTS Score Key:  ?70 Need to make dietary changes   40-70 Heart Healthy Diet  ? 40 Therapeutic Level Cholesterol Diet   Flowsheet Row CARDIAC REHAB PHASE II EXERCISE from 06/22/2020 in [place]  Picture Your Plate Total Score on Admission 36     Picture Your Plate Scores:  <67 Unhealthy dietary pattern with much room for improvement.  41-50 Dietary pattern unlikely to meet recommendations for good health and room for improvement.  51-60 More healthful dietary pattern, with some room for improvement.   >60 Healthy dietary pattern, although  there may be some specific behaviors that could be improved.    Nutrition Goals Re-Evaluation:  Nutrition Goals Re-Evaluation    [place] Name 06/24/20 1207             Goals   Current Weight 179 lb (81.2 kg)              Nutrition Goals Re-Evaluation:  Nutrition Goals Re-Evaluation    [place] Name 06/24/20 1207             Goals   Current Weight 179 lb (81.2 kg)              Nutrition Goals Discharge (Final Nutrition Goals Re-Evaluation):  Nutrition Goals Re-Evaluation - 06/24/20 1207      Goals   Current Weight 179 lb (81.2 kg)           Psychosocial: Target Goals: Acknowledge presence or absence of significant depression and/or stress, maximize coping skills, provide positive support system. Participant is able to verbalize types and ability to use techniques and skills needed for reducing stress and depression.  Initial Review & Psychosocial Screening:  Initial Psych Review & Screening - 06/11/20 1544      Initial Review   Current issues with None Identified      Family Dynamics   Good Support System? Yes   Ray has his wife and his wife's family for support     Barriers   Psychosocial barriers to participate in program There are no identifiable barriers or psychosocial needs.      Screening Interventions   Interventions Encouraged to exercise           Quality of Life Scores:  Quality of Life - 06/11/20 1321      Quality of Life   Select Quality of Life      Quality of Life Scores   Health/Function Pre 27.37 %    Socioeconomic Pre 28.5 %    Psych/Spiritual Pre 27.86 %    Family Pre 27.1 %    GLOBAL Pre 27.61 %          Scores of 19 and below usually indicate a poorer quality of life in these areas.  A difference of  2-3 points is a clinically meaningful difference.  A difference of 2-3 points in the total score of the Quality of Life Index has been associated with significant improvement in overall quality of life, self-image, physical  symptoms, and general health in studies assessing change in quality of life.  PHQ-9: Recent Review Flowsheet Data    Depression screen [facility] 2/9 06/11/2020 06/02/2020 05/08/2020 11/14/2019 09/11/2019   Decreased Interest 0 0 0 0 0   Down, Depressed, Hopeless - 0 0 0 0   PHQ - 2 Score 0 0 0 0 0   Altered sleeping 0 1 - - -   Tired, decreased energy - 1 - - -  Change in appetite - 0 - - -   Feeling bad or failure about yourself  - 0 - - -   Trouble concentrating - 0 - - -   Moving slowly or fidgety/restless - 0 - - -   Suicidal thoughts - 0 - - -   PHQ-9 Score 0 2 - - -   Difficult doing work/chores - Not difficult at all - - -     Interpretation of Total Score  Total Score Depression Severity:  1-4 = Minimal depression, 5-9 = Mild depression, 10-14 = Moderate depression, 15-19 = Moderately severe depression, 20-27 = Severe depression   Psychosocial Evaluation and Intervention:   Psychosocial Re-Evaluation:  Psychosocial Re-Evaluation    Row Name 06/30/20 0745             Psychosocial Re-Evaluation   Current issues with None Identified       Interventions Encouraged to attend Cardiac Rehabilitation for the exercise       Continue Psychosocial Services  No Follow up required              Psychosocial Discharge (Final Psychosocial Re-Evaluation):  Psychosocial Re-Evaluation - 06/30/20 0745      Psychosocial Re-Evaluation   Current issues with None Identified    Interventions Encouraged to attend Cardiac Rehabilitation for the exercise    Continue Psychosocial Services  No Follow up required           Vocational Rehabilitation: Provide vocational rehab assistance to qualifying candidates.   Vocational Rehab Evaluation & Intervention:  Vocational Rehab - 06/11/20 1545      Initial Vocational Rehab Evaluation & Intervention   Assessment shows need for Vocational Rehabilitation No   Ray is retired and does not need vocatinal rehab at this time           Education: Education Goals: Education classes will be provided on a weekly basis, covering required topics. Participant will state understanding/return demonstration of topics presented.  Learning Barriers/Preferences:  Learning Barriers/Preferences - 06/11/20 1324      Learning Barriers/Preferences   Learning Barriers Exercise Concerns;Hearing   Balance concerns: dizziness   Learning Preferences Written Material;Video;Computer/Internet;Pictoral           Education Topics: Count Your Pulse:  -Group instruction provided by verbal instruction, demonstration, patient participation and written materials to support subject.  Instructors address importance of being able to find your pulse and how to count your pulse when at home without a heart monitor.  Patients get hands on experience counting their pulse with staff help and individually.   Heart Attack, Angina, and Risk Factor Modification:  -Group instruction provided by verbal instruction, video, and written materials to support subject.  Instructors address signs and symptoms of angina and heart attacks.    Also discuss risk factors for heart disease and how to make changes to improve heart health risk factors.   Functional Fitness:  -Group instruction provided by verbal instruction, demonstration, patient participation, and written materials to support subject.  Instructors address safety measures for doing things around the house.  Discuss how to get up and down off the floor, how to pick things up properly, how to safely get out of a chair without assistance, and balance training.   Meditation and Mindfulness:  -Group instruction provided by verbal instruction, patient participation, and written materials to support subject.  Instructor addresses importance of mindfulness and meditation practice to help reduce stress and improve awareness.  Instructor also leads participants through a meditation  exercise.    Stretching for  Flexibility and Mobility:  -Group instruction provided by verbal instruction, patient participation, and written materials to support subject.  Instructors lead participants through series of stretches that are designed to increase flexibility thus improving mobility.  These stretches are additional exercise for major muscle groups that are typically performed during regular warm up and cool down.   Hands Only CPR:  -Group verbal, video, and participation provides a basic overview of AHA guidelines for community CPR. Role-play of emergencies allow participants the opportunity to practice calling for help and chest compression technique with discussion of AED use.   Hypertension: -Group verbal and written instruction that provides a basic overview of hypertension including the most recent diagnostic guidelines, risk factor reduction with self-care instructions and medication management.    Nutrition I class: Heart Healthy Eating:  -Group instruction provided by PowerPoint slides, verbal discussion, and written materials to support subject matter. The instructor gives an explanation and review of the Therapeutic Lifestyle Changes diet recommendations, which includes a discussion on lipid goals, dietary fat, sodium, fiber, plant stanol/sterol esters, sugar, and the components of a well-balanced, healthy diet.   Nutrition II class: Lifestyle Skills:  -Group instruction provided by PowerPoint slides, verbal discussion, and written materials to support subject matter. The instructor gives an explanation and review of label reading, grocery shopping for heart health, heart healthy recipe modifications, and ways to make healthier choices when eating out.   Diabetes Question & Answer:  -Group instruction provided by PowerPoint slides, verbal discussion, and written materials to support subject matter. The instructor gives an explanation and review of diabetes co-morbidities, pre- and post-prandial blood  glucose goals, pre-exercise blood glucose goals, signs, symptoms, and treatment of hypoglycemia and hyperglycemia, and foot care basics.   Diabetes Blitz:  -Group instruction provided by PowerPoint slides, verbal discussion, and written materials to support subject matter. The instructor gives an explanation and review of the physiology behind type 1 and type 2 diabetes, diabetes medications and rational behind using different medications, pre- and post-prandial blood glucose recommendations and Hemoglobin A1c goals, diabetes diet, and exercise including blood glucose guidelines for exercising safely.    Portion Distortion:  -Group instruction provided by PowerPoint slides, verbal discussion, written materials, and food models to support subject matter. The instructor gives an explanation of serving size versus portion size, changes in portions sizes over the last 20 years, and what consists of a serving from each food group.   Stress Management:  -Group instruction provided by verbal instruction, video, and written materials to support subject matter.  Instructors review role of stress in heart disease and how to cope with stress positively.     Exercising on Your Own:  -Group instruction provided by verbal instruction, power point, and written materials to support subject.  Instructors discuss benefits of exercise, components of exercise, frequency and intensity of exercise, and end points for exercise.  Also discuss use of nitroglycerin and activating EMS.  Review options of places to exercise outside of rehab.  Review guidelines for sex with heart disease.   Cardiac Drugs I:  -Group instruction provided by verbal instruction and written materials to support subject.  Instructor reviews cardiac drug classes: antiplatelets, anticoagulants, beta blockers, and statins.  Instructor discusses reasons, side effects, and lifestyle considerations for each drug class.   Cardiac Drugs II:  -Group  instruction provided by verbal instruction and written materials to support subject.  Instructor reviews cardiac drug classes: angiotensin converting enzyme inhibitors (ACE-I), angiotensin  II receptor blockers (ARBs), nitrates, and calcium channel blockers.  Instructor discusses reasons, side effects, and lifestyle considerations for each drug class.   Anatomy and Physiology of the Circulatory System:  Group verbal and written instruction and models provide basic cardiac anatomy and physiology, with the coronary electrical and arterial systems. Review of: AMI, Angina, Valve disease, Heart Failure, Peripheral Artery Disease, Cardiac Arrhythmia, Pacemakers, and the ICD.   Other Education:  -Group or individual verbal, written, or video instructions that support the educational goals of the cardiac rehab program.   Holiday Eating Survival Tips:  -Group instruction provided by PowerPoint slides, verbal discussion, and written materials to support subject matter. The instructor gives patients tips, tricks, and techniques to help them not only survive but enjoy the holidays despite the onslaught of food that accompanies the holidays.   Knowledge Questionnaire Score:  Knowledge Questionnaire Score - 06/11/20 1323      Knowledge Questionnaire Score   Pre Score 17/24           Core Components/Risk Factors/Patient Goals at Admission:  Personal Goals and Risk Factors at Admission - 06/11/20 1329      Core Components/Risk Factors/Patient Goals on Admission    Weight Management Weight Maintenance;Yes    Intervention Weight Management: Develop a combined nutrition and exercise program designed to reach desired caloric intake, while maintaining appropriate intake of nutrient and fiber, sodium and fats, and appropriate energy expenditure required for the weight goal.;Weight Management: Provide education and appropriate resources to help participant work on and attain dietary goals.    Hypertension Yes     Intervention Provide education on lifestyle modifcations including regular physical activity/exercise, weight management, moderate sodium restriction and increased consumption of fresh fruit, vegetables, and low fat dairy, alcohol moderation, and smoking cessation.;Monitor prescription use compliance.    Expected Outcomes Short Term: Continued assessment and intervention until BP is < 140/15m HG in hypertensive participants. < 130/814mHG in hypertensive participants with diabetes, heart failure or chronic kidney disease.;Long Term: Maintenance of blood pressure at goal levels.    Lipids Yes    Intervention Provide education and support for participant on nutrition & aerobic/resistive exercise along with prescribed medications to achieve LDL <7038mHDL >79m70m  Expected Outcomes Short Term: Participant states understanding of desired cholesterol values and is compliant with medications prescribed. Participant is following exercise prescription and nutrition guidelines.;Long Term: Cholesterol controlled with medications as prescribed, with individualized exercise RX and with personalized nutrition plan. Value goals: LDL < 70mg75mL > 40 mg.    Personal Goal Other Yes    Personal Goal Pt wants to regain strength in legs and heart health           Core Components/Risk Factors/Patient Goals Review:   Goals and Risk Factor Review    Row Name 06/30/20 0746             Core Components/Risk Factors/Patient Goals Review   Personal Goals Review Weight Management/Obesity;Hypertension;Lipids       Review Ray has been doing well with exercise at cardiac rehab. Vital signs have been stable       Expected Outcomes Ray will continue to participte in phase 2 cardiac rehab for exercise, nutrition and lifestyle modification              Core Components/Risk Factors/Patient Goals at Discharge (Final Review):   Goals and Risk Factor Review - 06/30/20 0746      Core Components/Risk Factors/Patient  Goals Review   Personal Goals  Review Weight Management/Obesity;Hypertension;Lipids    Review Ray has been doing well with exercise at cardiac rehab. Vital signs have been stable    Expected Outcomes Ray will continue to participte in phase 2 cardiac rehab for exercise, nutrition and lifestyle modification           ITP Comments:  ITP Comments    Row Name 06/11/20 1537 06/30/20 0744         ITP Comments Dr Fransico Him MD, Medical Director 30 Day ITP Review. Ray has good participation and attendance in phase 2 cardiac rehab             Comments: See ITP comments.

## 2020-06-30 DIAGNOSIS — M9904 Segmental and somatic dysfunction of sacral region: Secondary | ICD-10-CM | POA: Diagnosis not present

## 2020-06-30 DIAGNOSIS — M9903 Segmental and somatic dysfunction of lumbar region: Secondary | ICD-10-CM | POA: Diagnosis not present

## 2020-06-30 DIAGNOSIS — M5137 Other intervertebral disc degeneration, lumbosacral region: Secondary | ICD-10-CM | POA: Diagnosis not present

## 2020-06-30 DIAGNOSIS — M9905 Segmental and somatic dysfunction of pelvic region: Secondary | ICD-10-CM | POA: Diagnosis not present

## 2020-07-01 ENCOUNTER — Other Ambulatory Visit: Payer: Self-pay

## 2020-07-01 ENCOUNTER — Encounter (HOSPITAL_COMMUNITY)
Admission: RE | Admit: 2020-07-01 | Discharge: 2020-07-01 | Disposition: A | Discharge status: Home / Self Care | Payer: Medicare Other | Source: Ambulatory Visit | Attending: Cardiovascular Disease | Admitting: Cardiovascular Disease

## 2020-07-01 DIAGNOSIS — Z9889 Other specified postprocedural states: Secondary | ICD-10-CM | POA: Diagnosis not present

## 2020-07-02 DIAGNOSIS — M9905 Segmental and somatic dysfunction of pelvic region: Secondary | ICD-10-CM | POA: Diagnosis not present

## 2020-07-02 DIAGNOSIS — M5137 Other intervertebral disc degeneration, lumbosacral region: Secondary | ICD-10-CM | POA: Diagnosis not present

## 2020-07-02 DIAGNOSIS — M9903 Segmental and somatic dysfunction of lumbar region: Secondary | ICD-10-CM | POA: Diagnosis not present

## 2020-07-02 DIAGNOSIS — M9904 Segmental and somatic dysfunction of sacral region: Secondary | ICD-10-CM | POA: Diagnosis not present

## 2020-07-03 ENCOUNTER — Other Ambulatory Visit: Payer: Self-pay

## 2020-07-03 ENCOUNTER — Encounter (HOSPITAL_COMMUNITY)
Admission: RE | Admit: 2020-07-03 | Discharge: 2020-07-03 | Disposition: A | Discharge status: Home / Self Care | Payer: Medicare Other | Source: Ambulatory Visit | Attending: Cardiovascular Disease | Admitting: Cardiovascular Disease

## 2020-07-03 DIAGNOSIS — Z9889 Other specified postprocedural states: Secondary | ICD-10-CM | POA: Insufficient documentation

## 2020-07-03 NOTE — Progress Notes (Signed)
Ray reported feeling a little lightheaded on the recumbent bike. Telemetry rhythm Sinus rate 79. Blood pressure 120/62. Oxygen saturation 99% on room air. Exercise stopped briefly. Symptoms resolved with rest. Patient also reported that he felt light headed for about 15 minutes at home yesterday. Post stretching BP 122/68 sitting. Standing BP 138/70. No symptoms voiced. Stefani Dama NP paged and notified. Told patient to make sure that he is hydrating enough and to let Dr Claiborne Billings know at his office visit if he is till having symptoms. Will continue to monitor the patient throughout  the program.No complaints upon exit from phase 2 cardiac rehab.Barnet Pall, RN,BSN 07/03/2020 12:25 PM

## 2020-07-06 ENCOUNTER — Encounter: Payer: Self-pay | Admitting: Family Medicine

## 2020-07-06 ENCOUNTER — Other Ambulatory Visit: Payer: Self-pay

## 2020-07-06 ENCOUNTER — Ambulatory Visit (INDEPENDENT_AMBULATORY_CARE_PROVIDER_SITE_OTHER): Payer: Medicare Other | Admitting: Family Medicine

## 2020-07-06 ENCOUNTER — Encounter (HOSPITAL_COMMUNITY)
Admission: RE | Admit: 2020-07-06 | Discharge: 2020-07-06 | Disposition: A | Discharge status: Home / Self Care | Payer: Medicare Other | Source: Ambulatory Visit | Attending: Cardiovascular Disease | Admitting: Cardiovascular Disease

## 2020-07-06 ENCOUNTER — Ambulatory Visit: Payer: Medicare Other | Admitting: Family Medicine

## 2020-07-06 VITALS — BP 148/66 | HR 75 | Ht 69.0 in | Wt 180.0 lb

## 2020-07-06 DIAGNOSIS — M9903 Segmental and somatic dysfunction of lumbar region: Secondary | ICD-10-CM | POA: Diagnosis not present

## 2020-07-06 DIAGNOSIS — I10 Essential (primary) hypertension: Secondary | ICD-10-CM | POA: Diagnosis not present

## 2020-07-06 DIAGNOSIS — Z23 Encounter for immunization: Secondary | ICD-10-CM | POA: Diagnosis not present

## 2020-07-06 DIAGNOSIS — E7800 Pure hypercholesterolemia, unspecified: Secondary | ICD-10-CM

## 2020-07-06 DIAGNOSIS — M9905 Segmental and somatic dysfunction of pelvic region: Secondary | ICD-10-CM | POA: Diagnosis not present

## 2020-07-06 DIAGNOSIS — I480 Paroxysmal atrial fibrillation: Secondary | ICD-10-CM | POA: Diagnosis not present

## 2020-07-06 DIAGNOSIS — M5137 Other intervertebral disc degeneration, lumbosacral region: Secondary | ICD-10-CM | POA: Diagnosis not present

## 2020-07-06 DIAGNOSIS — Z9889 Other specified postprocedural states: Secondary | ICD-10-CM | POA: Diagnosis not present

## 2020-07-06 DIAGNOSIS — E039 Hypothyroidism, unspecified: Secondary | ICD-10-CM | POA: Diagnosis not present

## 2020-07-06 DIAGNOSIS — M9904 Segmental and somatic dysfunction of sacral region: Secondary | ICD-10-CM | POA: Diagnosis not present

## 2020-07-06 LAB — COAGUCHEK XS/INR WAIVED
INR: 1.8 — ABNORMAL HIGH (ref 0.9–1.1)
Prothrombin Time: 22 s

## 2020-07-06 NOTE — Addendum Note (Signed)
Addended by: Alphonzo Dublin on: 07/06/2020 03:24 PM   Modules accepted: Orders

## 2020-07-06 NOTE — Progress Notes (Signed)
BP (!) 148/66   Pulse 75   Ht _0  (1.753 m)   Wt 180 lb (81.6 kg)   SpO2 98%   BMI 26.58 kg/m    Subjective:   Patient ID: Patrick Bell, male    DOB: 1940-10-02, 80 y.o.   MRN: [id number]  HPI: Patrick Bell is a 79 y.o. male presenting on 07/06/2020 for Medical Management of Chronic Issues, Hypothyroidism, Hypertension, and Hyperlipidemia   HPI Hypothyroidism recheck Patient is coming in for thyroid recheck today as well. They deny any issues with hair changes or heat or cold problems or diarrhea or constipation. They deny any chest pain or palpitations. They are currently on levothyroxine 49mcrograms   Hypertension and A. fib Patient is currently on metoprolol, and their blood pressure today is 148/66. Patient denies any lightheadedness or dizziness. Patient denies headaches, blurred vision, chest pains, shortness of breath, or weakness. Denies any side effects from medication and is content with current medication.   Hyperlipidemia Patient is coming in for recheck of his hyperlipidemia. The patient is currently taking simvastatin. They deny any issues with myalgias or history of liver damage from it. They deny any focal numbness or weakness or chest pain.   Patient sees cardiology for his Coumadin and is currently taking 4 days a week a whole tablet of the 5 mg and 3 days a week he is taking 1/2 tablet of the 5 mg.  Relevant past medical, surgical, family and social history reviewed and updated as indicated. Interim medical history since our last visit reviewed. Allergies and medications reviewed and updated.  Review of Systems  Constitutional: Negative for chills and fever.  Eyes: Negative for visual disturbance.  Respiratory: Negative for shortness of breath and wheezing.   Cardiovascular: Negative for chest pain and leg swelling.  Musculoskeletal: Negative for back pain and gait problem.  Skin: Negative for rash.  Neurological: Negative for dizziness, weakness and  light-headedness.  All other systems reviewed and are negative.   Per HPI unless specifically indicated above   Allergies as of 07/06/2020   No Known Allergies     Medication List       Accurate as of July 06, 2020  2:46 PM. If you have any questions, ask your nurse or doctor.        acetaminophen 325 MG tablet Commonly known as: TYLENOL Take 1-2 tablets (325-650 mg total) by mouth every 4 (four) hours as needed for mild pain.   albuterol 108 (90 Base) MCG/ACT inhaler Commonly known as: ProAir HFA Inhale 2 puffs into the lungs every 6 (six) hours as needed for wheezing.   hydroxypropyl methylcellulose / hypromellose 2.5 % ophthalmic solution Commonly known as: ISOPTO TEARS / GONIOVISC Place 1-2 drops into both eyes 3 (three) times daily as needed (dry/irritated eyes.).   levothyroxine 50 MCG tablet Commonly known as: SYNTHROID Take 1 tablet (50 mcg total) by mouth daily.   metoprolol succinate 25 MG 24 hr tablet Commonly known as: TOPROL-XL TAKE 1 TABLET DAILY   montelukast 10 MG tablet Commonly known as: SINGULAIR Take 1 tablet (10 mg total) daily by mouth.   multivitamin with minerals Tabs tablet Take 1 tablet by mouth daily.   Olopatadine HCl 0.2 % Soln Place 1 drop into both eyes daily as needed (allergies).   pantoprazole 40 MG tablet Commonly known as: PROTONIX Take 1 tablet (40 mg total) daily by mouth.   simvastatin 10 MG tablet Commonly known as: ZOCOR Take 1 tablet (10  mg total) by mouth at bedtime.   warfarin 5 MG tablet Commonly known as: COUMADIN Take as directed by the anticoagulation clinic. If you are unsure how to take this medication, talk to your nurse or doctor. Original instructions: Take 1 tablet (5 mg total) by mouth daily at 4 PM.        Objective:   BP (!) 148/66   Pulse 75   Ht _0  (1.753 m)   Wt 180 lb (81.6 kg)   SpO2 98%   BMI 26.58 kg/m   Wt Readings from Last 3 Encounters:  07/06/20 180 lb (81.6 kg)  06/11/20  179 lb 7.3 oz (81.4 kg)  06/02/20 180 lb 9.6 oz (81.9 kg)    Physical Exam Vitals and nursing note reviewed.  Constitutional:      General: He is not in acute distress.    Appearance: He is well-developed. He is not diaphoretic.  Eyes:     General: No scleral icterus.    Conjunctiva/sclera: Conjunctivae normal.  Neck:     Thyroid: No thyromegaly.  Cardiovascular:     Rate and Rhythm: Normal rate and regular rhythm.     Heart sounds: Normal heart sounds. No murmur heard.   Pulmonary:     Effort: Pulmonary effort is normal. No respiratory distress.     Breath sounds: Normal breath sounds. No wheezing.  Musculoskeletal:        General: Normal range of motion.     Cervical back: Neck supple.  Lymphadenopathy:     Cervical: No cervical adenopathy.  Skin:    General: Skin is warm and dry.     Findings: No rash.  Neurological:     Mental Status: He is alert and oriented to person, place, and time.     Coordination: Coordination normal.  Psychiatric:        Behavior: Behavior normal.       Assessment & Plan:   Problem List Items Addressed This Visit      Cardiovascular and Mediastinum   Essential hypertension, benign   PAF (paroxysmal atrial fibrillation) (HCC)   Relevant Orders   CoaguChek XS/INR Waived   CBC with Differential/Platelet     Endocrine   Hypothyroidism - Primary   Relevant Orders   TSH   CMP14+EGFR     Other   Hyperlipidemia      Will check thyroid levels, he wants his INR checked today and then he will discuss with his cardiologist.  They are managing it. Follow up plan: Return in about 6 months (around 01/05/2021), or if symptoms worsen or fail to improve, for Thyroid and hypertension and cholesterol.  Counseling provided for all of the vaccine components No orders of the defined types were placed in this encounter.   Caryl Pina, MD [facility] 07/06/2020, 2:46 PM

## 2020-07-07 ENCOUNTER — Other Ambulatory Visit: Payer: Self-pay | Admitting: Family Medicine

## 2020-07-07 DIAGNOSIS — M5137 Other intervertebral disc degeneration, lumbosacral region: Secondary | ICD-10-CM | POA: Diagnosis not present

## 2020-07-07 DIAGNOSIS — M9904 Segmental and somatic dysfunction of sacral region: Secondary | ICD-10-CM | POA: Diagnosis not present

## 2020-07-07 DIAGNOSIS — M9905 Segmental and somatic dysfunction of pelvic region: Secondary | ICD-10-CM | POA: Diagnosis not present

## 2020-07-07 DIAGNOSIS — M9903 Segmental and somatic dysfunction of lumbar region: Secondary | ICD-10-CM | POA: Diagnosis not present

## 2020-07-07 LAB — CBC WITH DIFFERENTIAL/PLATELET
Basophils Absolute: 0.1 10*3/uL (ref 0.0–0.2)
Basos: 2 %
EOS (ABSOLUTE): 0 10*3/uL (ref 0.0–0.4)
Eos: 1 %
Hematocrit: 42.3 % (ref 37.5–51.0)
Hemoglobin: 13.2 g/dL (ref 13.0–17.7)
Immature Grans (Abs): 0 10*3/uL (ref 0.0–0.1)
Immature Granulocytes: 0 %
Lymphocytes Absolute: 1 10*3/uL (ref 0.7–3.1)
Lymphs: 17 %
MCH: 25.9 pg — ABNORMAL LOW (ref 26.6–33.0)
MCHC: 31.2 g/dL — ABNORMAL LOW (ref 31.5–35.7)
MCV: 83 fL (ref 79–97)
Monocytes Absolute: 0.5 10*3/uL (ref 0.1–0.9)
Monocytes: 9 %
Neutrophils Absolute: 4.3 10*3/uL (ref 1.4–7.0)
Neutrophils: 71 %
Platelets: 275 10*3/uL (ref 150–450)
RBC: 5.09 x10E6/uL (ref 4.14–5.80)
RDW: 15.4 % (ref 11.6–15.4)
WBC: 6 10*3/uL (ref 3.4–10.8)

## 2020-07-07 LAB — CMP14+EGFR
ALT: 6 IU/L (ref 0–44)
AST: 16 IU/L (ref 0–40)
Albumin/Globulin Ratio: 1.7 (ref 1.2–2.2)
Albumin: 4.3 g/dL (ref 3.7–4.7)
Alkaline Phosphatase: 81 IU/L (ref 44–121)
BUN/Creatinine Ratio: 16 (ref 10–24)
BUN: 19 mg/dL (ref 8–27)
Bilirubin Total: 0.3 mg/dL (ref 0.0–1.2)
CO2: 22 mmol/L (ref 20–29)
Calcium: 9.3 mg/dL (ref 8.6–10.2)
Chloride: 103 mmol/L (ref 96–106)
Creatinine, Ser: 1.19 mg/dL (ref 0.76–1.27)
Globulin, Total: 2.5 g/dL (ref 1.5–4.5)
Glucose: 77 mg/dL (ref 65–99)
Potassium: 4.3 mmol/L (ref 3.5–5.2)
Sodium: 142 mmol/L (ref 134–144)
Total Protein: 6.8 g/dL (ref 6.0–8.5)
eGFR: 62 mL/min/{1.73_m2} (ref >59)

## 2020-07-07 LAB — TSH: TSH: 2.93 u[IU]/mL (ref 0.450–4.500)

## 2020-07-08 ENCOUNTER — Encounter (HOSPITAL_COMMUNITY)
Admission: RE | Admit: 2020-07-08 | Discharge: 2020-07-08 | Disposition: A | Discharge status: Home / Self Care | Payer: Medicare Other | Source: Ambulatory Visit | Attending: Cardiovascular Disease | Admitting: Cardiovascular Disease

## 2020-07-08 ENCOUNTER — Other Ambulatory Visit: Payer: Self-pay

## 2020-07-08 DIAGNOSIS — M5137 Other intervertebral disc degeneration, lumbosacral region: Secondary | ICD-10-CM | POA: Diagnosis not present

## 2020-07-08 DIAGNOSIS — M9904 Segmental and somatic dysfunction of sacral region: Secondary | ICD-10-CM | POA: Diagnosis not present

## 2020-07-08 DIAGNOSIS — Z9889 Other specified postprocedural states: Secondary | ICD-10-CM

## 2020-07-08 DIAGNOSIS — M9903 Segmental and somatic dysfunction of lumbar region: Secondary | ICD-10-CM | POA: Diagnosis not present

## 2020-07-08 DIAGNOSIS — M9905 Segmental and somatic dysfunction of pelvic region: Secondary | ICD-10-CM | POA: Diagnosis not present

## 2020-07-10 ENCOUNTER — Other Ambulatory Visit: Payer: Self-pay

## 2020-07-10 ENCOUNTER — Encounter (HOSPITAL_COMMUNITY)
Admission: RE | Admit: 2020-07-10 | Discharge: 2020-07-10 | Disposition: A | Discharge status: Home / Self Care | Payer: Medicare Other | Source: Ambulatory Visit | Attending: Cardiovascular Disease | Admitting: Cardiovascular Disease

## 2020-07-10 DIAGNOSIS — Z9889 Other specified postprocedural states: Secondary | ICD-10-CM | POA: Diagnosis not present

## 2020-07-13 ENCOUNTER — Encounter (HOSPITAL_COMMUNITY)
Admission: RE | Admit: 2020-07-13 | Discharge: 2020-07-13 | Disposition: A | Discharge status: Home / Self Care | Payer: Medicare Other | Source: Ambulatory Visit | Attending: Cardiovascular Disease | Admitting: Cardiovascular Disease

## 2020-07-13 ENCOUNTER — Other Ambulatory Visit: Payer: Self-pay

## 2020-07-13 DIAGNOSIS — M5137 Other intervertebral disc degeneration, lumbosacral region: Secondary | ICD-10-CM | POA: Diagnosis not present

## 2020-07-13 DIAGNOSIS — Z9889 Other specified postprocedural states: Secondary | ICD-10-CM | POA: Diagnosis not present

## 2020-07-13 DIAGNOSIS — M9904 Segmental and somatic dysfunction of sacral region: Secondary | ICD-10-CM | POA: Diagnosis not present

## 2020-07-13 DIAGNOSIS — M9903 Segmental and somatic dysfunction of lumbar region: Secondary | ICD-10-CM | POA: Diagnosis not present

## 2020-07-13 DIAGNOSIS — M9905 Segmental and somatic dysfunction of pelvic region: Secondary | ICD-10-CM | POA: Diagnosis not present

## 2020-07-14 ENCOUNTER — Encounter: Payer: Self-pay | Admitting: Cardiovascular Disease

## 2020-07-14 ENCOUNTER — Ambulatory Visit (INDEPENDENT_AMBULATORY_CARE_PROVIDER_SITE_OTHER): Payer: Medicare Other | Admitting: Pharmacist Clinician (PhC)/ Clinical Pharmacy Specialist

## 2020-07-14 ENCOUNTER — Ambulatory Visit: Payer: Medicare Other | Admitting: Cardiovascular Disease

## 2020-07-14 DIAGNOSIS — I7101 Dissection of thoracic aorta: Secondary | ICD-10-CM

## 2020-07-14 DIAGNOSIS — G4733 Obstructive sleep apnea (adult) (pediatric): Secondary | ICD-10-CM | POA: Diagnosis not present

## 2020-07-14 DIAGNOSIS — Z7901 Long term (current) use of anticoagulants: Secondary | ICD-10-CM

## 2020-07-14 DIAGNOSIS — I71019 Dissection of thoracic aorta, unspecified: Secondary | ICD-10-CM

## 2020-07-14 DIAGNOSIS — E039 Hypothyroidism, unspecified: Secondary | ICD-10-CM

## 2020-07-14 DIAGNOSIS — Z9889 Other specified postprocedural states: Secondary | ICD-10-CM | POA: Diagnosis not present

## 2020-07-14 DIAGNOSIS — E785 Hyperlipidemia, unspecified: Secondary | ICD-10-CM

## 2020-07-14 DIAGNOSIS — I480 Paroxysmal atrial fibrillation: Secondary | ICD-10-CM | POA: Diagnosis not present

## 2020-07-14 DIAGNOSIS — I340 Nonrheumatic mitral (valve) insufficiency: Secondary | ICD-10-CM

## 2020-07-14 DIAGNOSIS — I10 Essential (primary) hypertension: Secondary | ICD-10-CM | POA: Diagnosis not present

## 2020-07-14 LAB — POCT INR: INR: 1.9 — AB (ref 2.0–3.0)

## 2020-07-14 MED ORDER — AMLODIPINE BESYLATE 2.5 MG PO TABS: 2.5000 mg | ORAL_TABLET | Freq: Every day | ORAL | 3 refills | Status: DC

## 2020-07-14 MED ORDER — METOPROLOL SUCCINATE ER 50 MG PO TB24: 50.0000 mg | ORAL_TABLET | Freq: Every day | ORAL | 3 refills | Status: DC

## 2020-07-14 NOTE — Progress Notes (Signed)
Cardiology Office Note    Date:  07/20/2020   ID:  Patrick CARILLO, DOB 1940/07/07, MRN [id number]  PCP:  Dettinger, Fransisca Kaufmann, MD  Cardiologist:  Shelva Majestic, MD   F/U evaluation following his prolonged hospitalization following valve surgery  History of Present Illness:  Patrick Bell is a 80 y.o. male who has a history of hypertension, hyperlipidemia, asthma as well as a history of obstructive sleep apnea initially diagnosed in 2016 .  At that time his AHI was 29.5, supine AHI, however was 56.9 and O2 desaturation was 82%.  He initially used CPAP but essentially stopped using therapy for over 3 years. He has a history of hypertension and remotely was on amlodipine but also has been off treatment for at least a year.  When I saw him for initial evaluation he had recently started to notice blood pressure elevation as well as mild shortness of breath. He was recently evaluated by Dr. Warrick Parisian and referred for an echo Doppler study on November 25, 2019 which revealed normal LV function with an EF of 60 to 65% with indeterminant diastolic parameters. RV systolic function was normal. There was mild LA dilation. He was felt to have severe mitral regurgitation with moderate holosystolic prolapse of the middle scallop of the posterior leaflet of the mitral valve. There was moderate mitral annular calcification in the severe MR had eccentric anteriorly directed jet. There was no evidence for mitral stenosis.  He was referred for cardiology evaluation.    When I initially saw him he denied any awareness of rhythm abnormality.  He was sleeping poorly and was unable to sleep on his back.  An Epworth Sleepiness Scale score calculated in the office was significantly increased at 18 consistent with excessive daytime sleepiness.  At my initial evaluation, I recommended that he undergo a transesophageal echocardiogram to assess his severe mitral regurgitation and possible potential flail leaflet.  I also  recommended he undergo a sleep study.  The patient subsequently underwent TEE which confirmed the presence of mitral valve prolapse with flail segment involving a portion of the posterior leaflet and severe mitral regurgitation.  He was subsequently seen by Almyra Deforest, PA and was referred to Dr. Darylene Price for surgical consultation.  He saw Dr. Roxy Manns on January 09, 2020 who felt he was a good candidate for mitral valve repair.  He subsequently has undergone CT imaging studies and was referred for right and left heart cardiac catheterization prior to planned surgery in early December.  He underwent his diagnostic polysomnogram on January 30, 2020 but did not meet split-night criteria and so his initial study was only diagnostic polysomnogram.  This reconfirmed moderate overall sleep apnea with an AHI of 23.1/h; however, sleep apnea was very severe in the supine position with an AHI of 52.2/h.   He subsequently underwent cardiac catheterization by me on February 21, 2020 prior to his planned valve surgery with Dr. Roxy Manns.  He had normal right heart pressures with a PA mean at 20 mm.  Coronary arteries were normal.  LVEDP was 16 mm.  He underwent elective mitral valve repair with complex valvuloplasty by Dr. Mayer Camel on March 11, 2020.  The procedure was complicated by an acute type a aortic dissection requiring graft repair of his aorta and resuspension of his native aortic valve.  His hospital course was further complicated by pericardial effusion requiring pericardial window, atrial fibrillation treated with amiodarone and Coumadin, anemia requiring transfusion, AKA, hypotension as well as a new  diagnosis of hypothyroidism.  After his pericardial window he converted back to sinus rhythm.  He was discharged to inpatient rehab on March 31, 2020 and was ultimately discharged from the rehab unit on April 08, 2019.  He was seen in follow-up by CT surgery on April 13, 2020 and at that time had complaints  of unintentional weight loss with decreased appetite fatigability and weakness.  He was eating smaller meals.  He was having difficulty with significant fatigability and daytime sleepiness.  There was concern that his hypothyroidism may be playing a role and he was encouraged to follow-up with his PCP.  He was evaluated by [name], PA in a telemedicine visit on April 21, 2020.  At that time, his blood pressure was controlled.  Creatinine was stable at 1.52 as was his anemia with a hemoglobin of 8.8.  I saw him for his initial post operative evaluation with me on May 11, 2020.  At that time he denied any chest pain or awareness of palpitations.  I reviewed his echo Doppler study from May 07, 2020 which showed an EF of 60 to 65%.  There was normal pulmonary artery systolic pressure.  There was moderate left atrial dilatation and mild dilation of his right atrium.  There was a 32 mm prosthetic annuloplasty ring in the mitral position with trivial MR.  The mean mitral gradient was 4.  Aortic sclerosis without stenosis.  He was significantly fatigued.  He was experiencing frequent nocturia up to 4-5 times per night and often was taking 2 naps per day.  He denies any chest pain or arrhythmia.  He is not sleeping well.  He is to start cardiac rehab.  He is always tired.  He has nocturia at least 4-5 times per night and was  taking 2 naps per day.  During that evaluation, reviewed his sleep study was done on January 30, 2020 due to his cardiac surgery he never underwent follow-up titration.  He had moderate overall sleep apnea with an AHI of 23.1 and O2 nadir at 83%.  At that time, since he had a CPAP ResMed air sense 10 AutoSet unit with an original set up date in February 2016 I recommended AutoPap at 7 to 20 cm.  Since I saw him  he was seen by Dr. Roxy Manns on May 25, 2020 and was felt to be doing well 3 months status post mitral valve repair complicated by his acute type a aortic dissection  requiring surgical repair.  He subsequently underwent CT angiography on June 12, 2020 of his chronic dissection involving the remaining transverse and descending thoracic aorta as well as abdominal aorta.  CT imaging revealed persistent aortic dissection flap through the arch, descending thoracic, abdominal aorta, bilateral iliac arteries, and left common femoral artery.  The dissection flap involves bilateral subclavian arteries, celiac, SMA, and bilateral renal arteries.  Since his for reevaluation with me he also initiated CPAP therapy.  He has felt improved since initiating treatment.  A download was obtained from February 10 through June 12, 2020 which shows usage at only 50% of days with usage at 3 hours and 22 minutes.  His CPAP auto range was set initially at 7 to 20 cm and his 95% average was 14.8 with a maximum average of 16.4.  Presently, he is sleeping better.  He continues to be fatigued.  He had undergone repeat laboratory and renal function has improved with a creatinine of 1.64 on April 27, 2020 to July 06, 2020  creatinine at 1.19.  He is unaware of any arrhythmias.  He presents for evaluation.   Past Medical History:  Diagnosis Date  . Acute thoracic aortic dissection (HCC) 03/11/2020   Type A  . Allergy   . Asthma   . Cataract   . Cataract   . Diverticulitis   . Dyspnea   . GERD (gastroesophageal reflux disease)   . Heart murmur 12/2019  . Hyperlipidemia   . Hypertension   . Inguinal hernia bilateral, non-recurrent   . S/P aortic dissection repair 03/11/2020   supracoronary straight graft repair with resuspension of native aortic valve and open distal anastomosis for intraoperative acute type A aortic dissection   . S/P mitral valve repair 03/11/2020   Complex valvuloplasty including artificial Gore-tex neochord placement x 4, suture plication of posterior leaflet and posteromedial commissure and 32 mm Sorin Memo 4D ring annuloplasty  . Sleep apnea     Past Surgical  History:  Procedure Laterality Date  . bilateral inguinal hernia    . CARDIAC CATHETERIZATION Bilateral 02/21/2020  . CHEST TUBE INSERTION N/A 03/22/2020   Procedure: CHEST TUBE INSERTION OF 28 BLAKE DRAIN.;  Surgeon: Grace Isaac, MD;  Location: [facility] OR;  Service: Thoracic;  Laterality: N/A;  . COLONOSCOPY  06/09/2004   VWU:[id number] colon diverticulosis, otherwise normal colonoscopy  . EYE SURGERY  08/2014  . HERNIA REPAIR    . MITRAL VALVE REPAIR N/A 03/11/2020   Procedure: MITRAL VALVE REPAIR (MVR) USING MEMO 4D SIZE 32 RING;  Surgeon: Rexene Alberts, MD;  Location: [place];  Service: Open Heart Surgery;  Laterality: N/A;  . PALATE / UVULA BIOPSY / EXCISION    . PERICARDIAL WINDOW N/A 03/22/2020   Procedure: SUBXYPHOID POST-OP PERICARDIAL FLUID DRAINAGE WITH CHEST TUBE INSERTION.;  Surgeon: Grace Isaac, MD;  Location: [place];  Service: Thoracic;  Laterality: N/A;  . REPAIR OF ACUTE ASCENDING THORACIC AORTIC DISSECTION N/A 03/11/2020   Procedure: REPAIR OF ACUTE ASCENDING THORACIC AORTIC DISSECTION USING A HEMASHIELD PLATINUM 26MM STRAIGHT GRAFT AND A HEASHIELD PLATINUM 28X10MM SINGLE ARM GRAFT;  Surgeon: Rexene Alberts, MD;  Location: [place];  Service: Open Heart Surgery;  Laterality: N/A;  . RIGHT/LEFT HEART CATH AND CORONARY ANGIOGRAPHY N/A 02/21/2020   Procedure: RIGHT/LEFT HEART CATH AND CORONARY ANGIOGRAPHY;  Surgeon: Troy Sine, MD;  Location: [facility];  Service: Cardiovascular;  Laterality: N/A;  . TEE WITHOUT CARDIOVERSION N/A 12/19/2019   Procedure: TRANSESOPHAGEAL ECHOCARDIOGRAM (TEE);  Surgeon: Buford Dresser, MD;  Location: [facility] ENDOSCOPY;  Service: Cardiovascular;  Laterality: N/A;  . TEE WITHOUT CARDIOVERSION N/A 03/11/2020   Procedure: TRANSESOPHAGEAL ECHOCARDIOGRAM (TEE);  Surgeon: Rexene Alberts, MD;  Location: [facility];  Service: Open Heart Surgery;  Laterality: N/A;  . TEE WITHOUT CARDIOVERSION N/A 03/22/2020   Procedure: TRANSESOPHAGEAL  ECHOCARDIOGRAM (TEE);  Surgeon: Grace Isaac, MD;  Location: [facility] OR;  Service: Thoracic;  Laterality: N/A;  . TONSILLECTOMY    . VASECTOMY      Current Medications: Outpatient Medications Prior to Visit  Medication Sig Dispense Refill  . acetaminophen (TYLENOL) 325 MG tablet Take 1-2 tablets (325-650 mg total) by mouth every 4 (four) hours as needed for mild pain.    Marland Kitchen albuterol (PROAIR HFA) 108 (90 Base) MCG/ACT inhaler Inhale 2 puffs into the lungs every 6 (six) hours as needed for wheezing. 8.5 g 2  . levothyroxine (SYNTHROID) 50 MCG tablet Take 1 tablet (50 mcg total) by mouth daily. 90 tablet 3  . montelukast (  SINGULAIR) 10 MG tablet Take 1 tablet (10 mg total) daily by mouth. 90 tablet 3  . Multiple Vitamin (MULTIVITAMIN WITH MINERALS) TABS tablet Take 1 tablet by mouth daily.    . Olopatadine HCl 0.2 % SOLN Place 1 drop into both eyes daily as needed (allergies).    . pantoprazole (PROTONIX) 40 MG tablet Take 1 tablet (40 mg total) daily by mouth. 90 tablet 3  . simvastatin (ZOCOR) 10 MG tablet Take 1 tablet (10 mg total) by mouth at bedtime. 90 tablet 3  . warfarin (COUMADIN) 5 MG tablet Take 1 tablet (5 mg total) by mouth daily at 4 PM. 30 tablet 3  . metoprolol succinate (TOPROL-XL) 25 MG 24 hr tablet TAKE 1 TABLET DAILY 30 tablet 2   No facility-administered medications prior to visit.     Allergies:   Patient has no known allergies.   Social History   Socioeconomic History  . Marital status: Married    Spouse name: Wells Guiles  . Number of children: 2  . Years of education: 44  . Highest education level: Some college, no degree  Occupational History  . 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 Smoking status: Former Smoker    Packs/day: 4.00    Years: 25.00    Pack years: 100.00    Types: Cigarettes    Quit date: 06/23/1982    Years since quitting: 38.1  . Smokeless tobacco: Never Used  . 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 Vaping Use: Never used  Substance and Sexual Activity  . Alcohol use: Yes    Alcohol/week: 5.0 standard drinks    Types: 5 Shots of liquor per week    Comment: brandy a few times a month, beer once a month  . Drug use: No  . Sexual activity: Yes    Birth control/protection: Surgical  Other Topics Concern  . Not on file  Social History Narrative  . Not on file   Social Determinants of Health   Financial Resource Strain: Not on file  Food Insecurity: Not on file  Transportation Needs: Not on file  Physical Activity: Not on file  Stress: Not on file  Social Connections: Not on file     Family History:  The patient's family history includes Arthritis in his brother, father, and sister; Dementia (age of onset: 24) in his mother; Heart disease in his father and mother; Hip fracture (age of onset: 74) in his mother; Osteoporosis in his mother.   ROS General: Negative; No fevers, chills, or night sweats;  HEENT: Negative; No changes in vision or hearing, sinus congestion, difficulty swallowing Pulmonary: Negative; No cough, wheezing, shortness of breath, hemoptysis Cardiovascular: see HPI GI: Negative; No nausea, vomiting, diarrhea, or abdominal pain GU: Negative; No dysuria, hematuria, or difficulty voiding Musculoskeletal: Negative; no myalgias, joint pain, or weakness Hematologic/Oncology: Negative; no easy bruising, bleeding Endocrine: Negative; no heat/cold intolerance; no diabetes Neuro: Negative; no changes in balance, headaches Skin: Negative; No rashes or skin lesions Psychiatric: Negative; No behavioral problems, depression Sleep: Positive for OSA with history of very severe sleep apnea with supine position currently with complaints of snoring, nocturia, daytime sleepiness and marked fatigability; nobruxism, restless legs, hypnogognic hallucinations, no cataplexy Other comprehensive 14 point system review is negative.   PHYSICAL EXAM:    VS:  BP (!) 158/78 (BP Location: Right Arm, Patient Position: Sitting, Cuff Size: Normal)  Pulse 75   Ht 5' 10"  (1.778 m)   Wt 182 lb 3.2 oz (82.6 kg)   BMI 26.14 kg/m     Repeat blood pressure by me was 160/82 in the right arm and 146/80 in the left arm.  Wt Readings from Last 3 Encounters:  07/14/20 182 lb 3.2 oz (82.6 kg)  07/06/20 180 lb (81.6 kg)  06/11/20 179 lb 7.3 oz (81.4 kg)    General: Alert, oriented, no distress.  Skin: normal turgor, no rashes, warm and dry HEENT: Normocephalic, atraumatic. Pupils equal round and reactive to light; sclera anicteric; extraocular muscles intact; Nose without nasal septal hypertrophy Mouth/Parynx benign; Mallinpatti scale 3 Neck: No JVD, no carotid bruits; normal carotid upstroke Lungs: clear to ausculatation and percussion; no wheezing or rales Chest wall: without tenderness to palpitation Heart: PMI not displaced, RRR, s1 s2 normal, 1/6 systolic murmur, no diastolic murmur, no rubs, gallops, thrills, or heaves Abdomen: soft, nontender; no hepatosplenomehaly, BS+; abdominal aorta nontender and not dilated by palpation. Back: no CVA tenderness Pulses 2+ Musculoskeletal: full range of motion, normal strength, no joint deformities Extremities: no clubbing cyanosis or edema, Homan's sign negative  Neurologic: grossly nonfocal; Cranial nerves grossly wnl Psychologic: Normal mood and affect   Studies/Labs Reviewed:   EKG:  EKG is ordered today.  ECG (independently read by me):  NSR at 71; no significant STT changes; QTc 478 msec  Recent Labs: BMP Latest Ref Rng & Units 07/06/2020 04/27/2020 04/06/2020  Glucose 65 - 99 mg/dL 77 109(H) 89  BUN 8 - 27 mg/dL 19 23 23   Creatinine 0.76 - 1.27 mg/dL 1.19 1.64(H) 1.52(H)  BUN/Creat Ratio 10 - 24 16 14  -  Sodium 134 - 144 mmol/L 142 142 134(L)  Potassium 3.5 - 5.2 mmol/L 4.3 4.4 3.8  Chloride 96 - 106 mmol/L 103 104 102  CO2 20 - 29 mmol/L 22 25 24   Calcium 8.6 - 10.2 mg/dL 9.3 9.1 8.5(L)      Hepatic Function Latest Ref Rng & Units 07/06/2020 04/01/2020 03/23/2020  Total Protein 6.0 - 8.5 g/dL 6.8 5.3(L) 4.9(L)  Albumin 3.7 - 4.7 g/dL 4.3 2.0(L) 2.0(L)  AST 0 - 40 IU/L 16 18 35  ALT 0 - 44 IU/L 6 21 32  Alk Phosphatase 44 - 121 IU/L 81 63 62  Total Bilirubin 0.0 - 1.2 mg/dL 0.3 0.5 0.7    CBC Latest Ref Rng & Units 07/06/2020 04/27/2020 04/07/2020  WBC 3.4 - 10.8 x10E3/uL 6.0 6.9 5.4  Hemoglobin 13.0 - 17.7 g/dL 13.2 10.9(L) 8.8(L)  Hematocrit 37.5 - 51.0 % 42.3 33.7(L) 28.4(L)  Platelets 150 - 450 x10E3/uL 275 376 389   Lab Results  Component Value Date   MCV 83 07/06/2020   MCV 85 04/27/2020   MCV 87.9 04/07/2020   Lab Results  Component Value Date   TSH 2.930 07/06/2020   Lab Results  Component Value Date   HGBA1C 5.4 03/09/2020     BNP No results found for: BNP  ProBNP No results found for: PROBNP   Lipid Panel     Component Value Date/Time   CHOL 184 11/14/2019 1004   CHOL 168 06/18/2012 1031   TRIG 87 11/14/2019 1004   TRIG 101 01/14/2013 1218   TRIG 102 06/18/2012 1031   HDL 70 11/14/2019 1004   HDL 65 01/14/2013 1218   HDL 62 06/18/2012 1031   CHOLHDL 2.6 11/14/2019 1004   LDLCALC 98 11/14/2019 1004   LDLCALC 99 01/14/2013 1218   LDLCALC  86 06/18/2012 1031   LABVLDL 16 11/14/2019 1004     RADIOLOGY: No results found.   Additional studies/ records that were reviewed today include:   R/L Heart Cath: 02/21/2020  Normal right heart pressures with PA mean pressure at 20 mmHg.  Normal coronary arteries.  LVEDP: 19mHg  RECOMMENDATION: Mr. RCreg Gilmeris an 80year old gentleman who was recently found to have a cardiac murmur.  He has subsequently been found to have severe mitral regurgitation in the setting of normal LV function with an EF of 60 to 65%.  He is felt to have severe prolapse involving the portion of the posterior leaflet of the mitral valve with at least one ruptured primary chordae tendineae with a flail  segment causing severe mitral regurgitation.  Presently, right heart pressures are essentially normal with borderline increased PA systolic pressure at 31 mm.  Coronary anatomy is normal.  He has undergone CT angiography of his vasculature ordered by Dr. ORoxy Manns  He will be following up with Dr. ORoxy Mannsand is tentatively scheduled for mitral valve repair surgery on March 11, 2020.   ECHO 05/07/2020 IMPRESSIONS  1. Left ventricular ejection fraction, by estimation, is 60 to 65%. The  left ventricle has normal function. The left ventricle has no regional  wall motion abnormalities. Left ventricular diastolic function could not  be evaluated.  2. Right ventricular systolic function is normal. The right ventricular  size is normal. There is normal pulmonary artery systolic pressure.  3. Left atrial size was moderately dilated.  4. Right atrial size was mildly dilated.  5. The mitral valve has been repaired/replaced. Trivial mitral valve  regurgitation. The mean mitral valve gradient is 4.0 mmHg. There is a 32  mm prosthetic annuloplasty ring present in the mitral position. Procedure  Date: 03/11/2020.  6. The aortic valve is tricuspid. There is mild calcification of the  aortic valve. There is mild thickening of the aortic valve. Aortic valve  regurgitation is trivial.  7. The inferior vena cava is normal in size with greater than 50%  respiratory variability, suggesting right atrial pressure of 3 mmHg.    ASSESSMENT:    1. Severe mitral regurgitation by echocardiogram prior to surgery   2. S/P mitral valve repair   3. S/P aortic dissection repair   4. Chronic aortic dissection (HCC)   5. PAF (paroxysmal atrial fibrillation) ([place]   6. Obstructive sleep apnea (adult) (pediatric)   7. Essential hypertension   8. Warfarin anticoagulation   9. Hyperlipidemia LDL goal <70   10. Hypothyroidism, unspecified type     PLAN:  Mr. RMontario Zilkais an 80year old gentleman who has a history  of hypertension, and was found to have severe mitral valve prolapse with a flail segment resulting in severe mitral regurgitation.  He had a remote history of sleep apnea which was very severe during supine sleep and had stopped using CPAP therapy over 3 years ago.  He was found to have normal coronary arteries and was admitted for elective mitral valve repair with complex valvuloplasty and is operation was complicated by acute type a aortic dissection necessitating graft repair of his aorta and resuspension of his native aortic valve.  He also had further complications with pericardial effusion requiring pericardial window, atrial fibrillation, anemia, AKA, and also was diagnosed with hypothyroidism.  His most recent echo of May 07, 2020 was reviewed which showed an EF of 60 to 65%.  The left atrium was moderately dilated and the right atrium was mildly  dilated.  There was trivial MR.  Subsequently, he is doing well regarding his complex valve surgery and complications.  When I saw him for my initial post hospital evaluation with me he was having significant fatigability, was experiencing nocturia at least 4-5 times per night and was requiring at least 2 naps per day.  At that time I reviewed his sleep study from October 2021 revealed moderate overall sleep apnea but this was very severe during REM sleep with an AHI at 52.2/h.  Because of his cardiac surgery he never had CPAP titration.  I instituted AutoPap therapy his initial visit with me in February.  I reviewed his most recent download.  Presently I am increasing his pressure settings since his 95th percentile pressure is 14.8 and maximum average pressure is 60.4.  I will change him and will reduce his ramp time to 15 minutes with ramp start increasing to 6 cm of water.  I will change his auto mode to 11 up to 18 cm of water.  Most recent renal function is improved with creatinine now 1.19 compared to 1.64.  I reviewed his recent CT of his aorta which  continues to show extensive chronic dissection through the arch, descending thoracic, abdominal aorta, bilateral iliac arteries and left common femoral artery.  There also was a dissection flap involving the bilateral subclavian arteries, celiac, SMA and bilateral renal arteries.  His blood pressure today is elevated.  I am adding amlodipine 5 mg to his medical regimen for more optimal blood pressure control particularly with his chronic dissection and have recommended titration of his metoprolol succinate from 25 mg up to 50 mg daily. He is on warfarin anticoagulation he had recently self regulated his dose and I have suggested a follow-up INR today.   He continues to be on low-dose simvastatin and is tolerating this well.  Post to follow-up with Dr. Roxy Manns several months.  I will see him in 3 months for cardiology reevaluation with me.   Medication Adjustments/Labs and Tests Ordered: Current medicines are reviewed at length with the patient today.  Concerns regarding medicines are outlined above.  Medication changes, Labs and Tests ordered today are listed in the Patient Instructions below. Patient Instructions  Medication Instructions:  INCREASE metoprolol succinate (Toprol XL) to 50 mg daily START amlodipine 2.5 mg daily at bedtime  *If you need a refill on your cardiac medications before your next appointment, please call your pharmacy*  Follow-Up: At [facility], you and your health needs are our priority.  As part of our continuing mission to provide you with exceptional heart care, we have created designated Provider Care Teams.  These Care Teams include your primary Cardiologist (physician) and Advanced Practice Providers (APPs -  Physician Assistants and Nurse Practitioners) who all work together to provide you with the care you need, when you need it.  We recommend signing up for the patient portal called "MyChart".  Sign up information is provided on this After Visit Summary.  MyChart is  used to connect with patients for Virtual Visits (Telemedicine).  Patients are able to view lab/test results, encounter notes, upcoming appointments, etc.  Non-urgent messages can be sent to your provider as well.   To learn more about what you can do with MyChart, go to [email or web address].    Your next appointment:   3 month(s)  The format for your next appointment:   In Person  Provider:   Shelva Majestic, MD        Signed, Marcello Moores  Claiborne Billings, MD  07/20/2020 6:32 PM    [facility] [address] [place], [place], [place]  [address] Phone: [phone]

## 2020-07-14 NOTE — Patient Instructions (Signed)
Medication Instructions:  INCREASE metoprolol succinate (Toprol XL) to 50 mg daily START amlodipine 2.5 mg daily at bedtime  *If you need a refill on your cardiac medications before your next appointment, please call your pharmacy*  Follow-Up: At [facility], you and your health needs are our priority.  As part of our continuing mission to provide you with exceptional heart care, we have created designated Provider Care Teams.  These Care Teams include your primary Cardiologist (physician) and Advanced Practice Providers (APPs -  Physician Assistants and Nurse Practitioners) who all work together to provide you with the care you need, when you need it.  We recommend signing up for the patient portal called "MyChart".  Sign up information is provided on this After Visit Summary.  MyChart is used to connect with patients for Virtual Visits (Telemedicine).  Patients are able to view lab/test results, encounter notes, upcoming appointments, etc.  Non-urgent messages can be sent to your provider as well.   To learn more about what you can do with MyChart, go to [email or web address].    Your next appointment:   3 month(s)  The format for your next appointment:   In Person  Provider:   Shelva Majestic, MD

## 2020-07-14 NOTE — Patient Instructions (Signed)
Change dose to 1 tablet daily except 1/2 tablet each Sunday.  Repeat INR in 3 weeks

## 2020-07-15 ENCOUNTER — Other Ambulatory Visit: Payer: Self-pay

## 2020-07-15 ENCOUNTER — Encounter (HOSPITAL_COMMUNITY)
Admission: RE | Admit: 2020-07-15 | Discharge: 2020-07-15 | Disposition: A | Discharge status: Home / Self Care | Payer: Medicare Other | Source: Ambulatory Visit | Attending: Cardiovascular Disease | Admitting: Cardiovascular Disease

## 2020-07-15 DIAGNOSIS — M9903 Segmental and somatic dysfunction of lumbar region: Secondary | ICD-10-CM | POA: Diagnosis not present

## 2020-07-15 DIAGNOSIS — Z9889 Other specified postprocedural states: Secondary | ICD-10-CM

## 2020-07-15 DIAGNOSIS — M5137 Other intervertebral disc degeneration, lumbosacral region: Secondary | ICD-10-CM | POA: Diagnosis not present

## 2020-07-15 DIAGNOSIS — M9905 Segmental and somatic dysfunction of pelvic region: Secondary | ICD-10-CM | POA: Diagnosis not present

## 2020-07-15 DIAGNOSIS — M9904 Segmental and somatic dysfunction of sacral region: Secondary | ICD-10-CM | POA: Diagnosis not present

## 2020-07-17 ENCOUNTER — Encounter (HOSPITAL_COMMUNITY)
Admission: RE | Admit: 2020-07-17 | Discharge: 2020-07-17 | Disposition: A | Discharge status: Home / Self Care | Payer: Medicare Other | Source: Ambulatory Visit | Attending: Cardiovascular Disease | Admitting: Cardiovascular Disease

## 2020-07-17 ENCOUNTER — Other Ambulatory Visit: Payer: Self-pay

## 2020-07-17 DIAGNOSIS — Z9889 Other specified postprocedural states: Secondary | ICD-10-CM

## 2020-07-20 ENCOUNTER — Other Ambulatory Visit: Payer: Self-pay

## 2020-07-20 ENCOUNTER — Encounter: Payer: Self-pay | Admitting: Cardiovascular Disease

## 2020-07-20 ENCOUNTER — Encounter (HOSPITAL_COMMUNITY)
Admission: RE | Admit: 2020-07-20 | Discharge: 2020-07-20 | Disposition: A | Discharge status: Home / Self Care | Payer: Medicare Other | Source: Ambulatory Visit | Attending: Cardiovascular Disease | Admitting: Cardiovascular Disease

## 2020-07-20 DIAGNOSIS — M9905 Segmental and somatic dysfunction of pelvic region: Secondary | ICD-10-CM | POA: Diagnosis not present

## 2020-07-20 DIAGNOSIS — Z9889 Other specified postprocedural states: Secondary | ICD-10-CM

## 2020-07-20 DIAGNOSIS — M5137 Other intervertebral disc degeneration, lumbosacral region: Secondary | ICD-10-CM | POA: Diagnosis not present

## 2020-07-20 DIAGNOSIS — M9903 Segmental and somatic dysfunction of lumbar region: Secondary | ICD-10-CM | POA: Diagnosis not present

## 2020-07-20 DIAGNOSIS — M9904 Segmental and somatic dysfunction of sacral region: Secondary | ICD-10-CM | POA: Diagnosis not present

## 2020-07-22 ENCOUNTER — Other Ambulatory Visit: Payer: Self-pay

## 2020-07-22 ENCOUNTER — Encounter (HOSPITAL_COMMUNITY)
Admission: RE | Admit: 2020-07-22 | Discharge: 2020-07-22 | Disposition: A | Discharge status: Home / Self Care | Payer: Medicare Other | Source: Ambulatory Visit | Attending: Cardiovascular Disease | Admitting: Cardiovascular Disease

## 2020-07-22 DIAGNOSIS — Z9889 Other specified postprocedural states: Secondary | ICD-10-CM

## 2020-07-24 ENCOUNTER — Encounter (HOSPITAL_COMMUNITY)
Admission: RE | Admit: 2020-07-24 | Discharge: 2020-07-24 | Disposition: A | Discharge status: Home / Self Care | Payer: Medicare Other | Source: Ambulatory Visit | Attending: Cardiovascular Disease | Admitting: Cardiovascular Disease

## 2020-07-24 ENCOUNTER — Other Ambulatory Visit: Payer: Self-pay

## 2020-07-24 DIAGNOSIS — Z9889 Other specified postprocedural states: Secondary | ICD-10-CM | POA: Diagnosis not present

## 2020-07-24 NOTE — Progress Notes (Signed)
Nutrition Note: Follow up  Spoke with pt to follow up on nutrition goals.  He is trying to carry water bottle but has not been successful. He thinks accountability from his wife would help. He has not used the salt shaker. He has eliminated sausage and is eating bacon only 1x/week. He is trying to cut back on red meat and fatty pork. He eats out 1-2 times per week.  We discussed vitamin k rich foods and INR.   Pt verbalized understanding.   Nutrition Diagnosis   Food-and nutrition-related knowledge deficit related to lack of exposure to information as related to diagnosis of: ? CVD ?   Nutrition Intervention   Pt's individual nutrition plan reviewed with pt.  Benefits of adopting Heart Healthy diet discussed when Picture Your Plate reviewed.  Continue client-centered nutrition education by RD, as part of interdisciplinary care.  Goal(s)  Pt to carry water bottle to increase water intake   Pt to reduce saturated fats by cutting back on processed and red meats  Pt to eliminate salt shaker to reduce sodium intake   Plan:  Will provide client-centered nutrition education as part of interdisciplinary care  Monitor and evaluate progress toward nutrition goal with team.  Michaele Offer, MS, RDN, LDN

## 2020-07-27 ENCOUNTER — Other Ambulatory Visit: Payer: Self-pay

## 2020-07-27 ENCOUNTER — Encounter (HOSPITAL_COMMUNITY)
Admission: RE | Admit: 2020-07-27 | Discharge: 2020-07-27 | Disposition: A | Discharge status: Home / Self Care | Payer: Medicare Other | Source: Ambulatory Visit | Attending: Cardiovascular Disease | Admitting: Cardiovascular Disease

## 2020-07-27 DIAGNOSIS — Z9889 Other specified postprocedural states: Secondary | ICD-10-CM

## 2020-07-28 NOTE — Progress Notes (Signed)
Cardiac Individual Treatment Plan  Patient Details  Name: Patrick Bell MRN: [id number] Date of Birth: 08-Jun-1940 Referring Provider:   Flowsheet Row CARDIAC REHAB PHASE II ORIENTATION from 06/11/2020 in [facility]  Referring Provider Dr. Shelva Majestic MD      Initial Encounter Date:  [place] from 06/11/2020 in [facility]  Date 06/11/20      Visit Diagnosis: 03/11/20 MV Repair with aortic dissection Repair  Patient's Home Medications on Admission:  Current Outpatient Medications:  .  acetaminophen (TYLENOL) 325 MG tablet, Take 1-2 tablets (325-650 mg total) by mouth every 4 (four) hours as needed for mild pain., Disp: , Rfl:  .  albuterol (PROAIR HFA) 108 (90 Base) MCG/ACT inhaler, Inhale 2 puffs into the lungs every 6 (six) hours as needed for wheezing., Disp: 8.5 g, Rfl: 2 .  amLODipine (NORVASC) 2.5 MG tablet, Take 1 tablet (2.5 mg total) by mouth daily., Disp: 90 tablet, Rfl: 3 .  levothyroxine (SYNTHROID) 50 MCG tablet, Take 1 tablet (50 mcg total) by mouth daily., Disp: 90 tablet, Rfl: 3 .  metoprolol succinate (TOPROL-XL) 50 MG 24 hr tablet, Take 1 tablet (50 mg total) by mouth daily. Take with or immediately following a meal., Disp: 90 tablet, Rfl: 3 .  montelukast (SINGULAIR) 10 MG tablet, Take 1 tablet (10 mg total) daily by mouth., Disp: 90 tablet, Rfl: 3 .  Multiple Vitamin (MULTIVITAMIN WITH MINERALS) TABS tablet, Take 1 tablet by mouth daily., Disp: , Rfl:  .  Olopatadine HCl 0.2 % SOLN, Place 1 drop into both eyes daily as needed (allergies)., Disp: , Rfl:  .  pantoprazole (PROTONIX) 40 MG tablet, Take 1 tablet (40 mg total) daily by mouth., Disp: 90 tablet, Rfl: 3 .  simvastatin (ZOCOR) 10 MG tablet, Take 1 tablet (10 mg total) by mouth at bedtime., Disp: 90 tablet, Rfl: 3 .  warfarin (COUMADIN) 5 MG tablet, Take 1 tablet (5 mg total) by mouth daily at 4 PM., Disp: 30  tablet, Rfl: 3  Past Medical History: Past Medical History:  Diagnosis Date  . Acute thoracic aortic dissection (HCC) 03/11/2020   Type A  . Allergy   . Asthma   . Cataract   . Cataract   . Diverticulitis   . Dyspnea   . GERD (gastroesophageal reflux disease)   . Heart murmur 12/2019  . Hyperlipidemia   . Hypertension   . Inguinal hernia bilateral, non-recurrent   . S/P aortic dissection repair 03/11/2020   supracoronary straight graft repair with resuspension of native aortic valve and open distal anastomosis for intraoperative acute type A aortic dissection   . S/P mitral valve repair 03/11/2020   Complex valvuloplasty including artificial Gore-tex neochord placement x 4, suture plication of posterior leaflet and posteromedial commissure and 32 mm Sorin Memo 4D ring annuloplasty  . Sleep apnea     Tobacco Use: Social History   Tobacco Use  Smoking Status Former Smoker  . Packs/day: 4.00  . Years: 25.00  . Pack years: 100.00  . Types: Cigarettes  . Quit date: 06/23/1982  . Years since quitting: 38.1  Smokeless Tobacco Never Used  Tobacco Comment   pt was a truck Oceanographer: Recent Review [place] for [facility] Cardiac and Pulmonary Rehab Latest Ref Rng & Units 03/12/2020 03/12/2020 03/13/2020 03/14/2020 03/15/2020   Cholestrol 100 - 199 mg/dL - - - - -  LDLCALC 0 - 99 mg/dL - - - - -   HDL >39 mg/dL - - - - -   Trlycerides 0 - 149 mg/dL - - - - -   Hemoglobin A1c 4.8 - 5.6 % - - - - -   PHART 7.350 - 7.450 7.373 - - - -   PCO2ART 32.0 - 48.0 mmHg 44.6 - - - -   HCO3 20.0 - 28.0 mmol/L 26.1 - - - -   TCO2 22 - 32 mmol/L 27 23 - - -   ACIDBASEDEF 0.0 - 2.0 mmol/L - - - - -   O2SAT % 100.0 - 66.8 68.9 68.9      Capillary Blood Glucose: Lab Results  Component Value Date   GLUCAP 79 03/31/2020   GLUCAP 93 03/30/2020   GLUCAP 83 03/30/2020   GLUCAP 91 03/30/2020   GLUCAP 142 (H) 03/29/2020     Exercise Target Goals: Exercise Program  Goal: Individual exercise prescription set using results from initial 6 min walk test and THRR while considering  patient's activity barriers and safety.   Exercise Prescription Goal: Initial exercise prescription builds to 30-45 minutes a day of aerobic activity, 2-3 days per week.  Home exercise guidelines will be given to patient during program as part of exercise prescription that the participant will acknowledge.  Activity Barriers & Risk Stratification:  Activity Barriers & Cardiac Risk Stratification - 06/11/20 1305      Activity Barriers & Cardiac Risk Stratification   Activity Barriers Balance Concerns;Neck/Spine Problems   Balance: 8 seconds on single leg stand. Spine: chronic Low back pain   Cardiac Risk Stratification High   below 5 MET's          6 Minute Walk:  6 Minute Walk    Row Name 06/11/20 1220         6 Minute Walk   Phase Initial     Distance 1234 feet     Walk Time 6 minutes     # of Rest Breaks 0     MPH 2.34     METS 2.14     RPE 10     Perceived Dyspnea  0     VO2 Peak 7.52     Symptoms No     Resting HR 77 bpm     Resting BP 102/70     Resting Oxygen Saturation  98 %     Exercise Oxygen Saturation  during 6 min walk 99 %     Max Ex. HR 81 bpm     Max Ex. BP 128/84     2 Minute Post BP 126/78            Oxygen Initial Assessment:   Oxygen Re-Evaluation:   Oxygen Discharge (Final Oxygen Re-Evaluation):   Initial Exercise Prescription:  Initial Exercise Prescription - 06/11/20 1300      Date of Initial Exercise RX and Referring Provider   Date 06/11/20    Referring Provider Dr. Shelva Majestic MD    Expected Discharge Date 08/07/20      Recumbant Bike   Level 1.5    RPM 60    Minutes 15    METs 1.5      NuStep   Level 2    SPM 80    Minutes 15    METs 2      Prescription Details   Frequency (times per week) 3    Duration Progress to 30 minutes of continuous aerobic without  signs/symptoms of physical distress       Intensity   THRR 40-80% of Max Heartrate 56-112    Ratings of Perceived Exertion 11-13    Perceived Dyspnea 0-4      Progression   Progression Continue progressive overload as per policy without signs/symptoms or physical distress.      Resistance Training   Training Prescription Yes    Weight 3    Reps 10-15           Perform Capillary Blood Glucose checks as needed.  Exercise Prescription Changes:  Exercise Prescription Changes    Row Name 06/15/20 1105 06/22/20 1059 06/29/20 1046 07/13/20 1048 07/27/20 1051     Response to Exercise   Blood Pressure (Admit) 98/62 130/60 148/70 130/60 136/68   Blood Pressure (Exercise) 170/80 134/62 138/62 132/60 128/72   Blood Pressure (Exit) 127/83 116/58 142/72 120/62 128/82   Heart Rate (Admit) 82 bpm 81 bpm 77 bpm 75 bpm 80 bpm   Heart Rate (Exercise) 94 bpm 85 bpm 84 bpm 87 bpm 81 bpm   Heart Rate (Exit) 86 bpm 82 bpm 81 bpm 77 bpm 76 bpm   Rating of Perceived Exertion (Exercise) 11 10 12 13 13    Symptoms none none none none none   Comments Off to a good start with exercise. -- -- -- --   Duration Continue with 30 min of aerobic exercise without signs/symptoms of physical distress. Continue with 30 min of aerobic exercise without signs/symptoms of physical distress. Continue with 30 min of aerobic exercise without signs/symptoms of physical distress. Continue with 30 min of aerobic exercise without signs/symptoms of physical distress. Continue with 30 min of aerobic exercise without signs/symptoms of physical distress.   Intensity THRR unchanged THRR unchanged THRR unchanged THRR unchanged THRR unchanged     Progression   Progression Continue to progress workloads to maintain intensity without signs/symptoms of physical distress. Continue to progress workloads to maintain intensity without signs/symptoms of physical distress. Continue to progress workloads to maintain intensity without signs/symptoms of physical distress. Continue to  progress workloads to maintain intensity without signs/symptoms of physical distress. Continue to progress workloads to maintain intensity without signs/symptoms of physical distress.   Average METs 1.7 2.2 2.2 2.3 2.1     Resistance Training   Training Prescription Yes Yes Yes Yes No   Weight 3 3 3 3  --   Reps 10-15 10-15 10-15 10-15 --   Time 10 Minutes 10 Minutes 10 Minutes 10 Minutes --     Interval Training   Interval Training No No No No No     Recumbant Bike   Level 1.5 3 4 4 5    RPM 60 -- -- -- --   Minutes 15 15 15 15 15    METs 1.8 2.1 2.2 2.2 2     NuStep   Level 2 3 4 4 5    SPM 80 85 85 85 85   Minutes 15 15 15 15 15    METs 1.7 2.3 2.3 2.4 2.3     Home Exercise Plan   Plans to continue exercise at -- -- Home (comment)  Walking Home (comment)  Walking Home (comment)  Walking   Frequency -- -- Add 4 additional days to program exercise sessions. Add 4 additional days to program exercise sessions. Add 4 additional days to program exercise sessions.   Initial Home Exercises Provided -- -- 06/24/20 06/24/20 06/24/20          Exercise Comments:  Exercise Comments  [place] Name 06/15/20 1211 06/24/20 1050 06/29/20 1141 07/15/20 1140 07/27/20 1134   Exercise Comments Patient tolerated first session of exercise well without symptoms. Reviewed home exercise guidelines, METs, and goals. Increased workload on NuStep and recumbent bike today. Reviewed METs with patient. Reviewed METs and goals with patient.          Exercise Goals and Review:  Exercise Goals    Row Name 06/11/20 1316             Exercise Goals   Increase Physical Activity Yes       Intervention Provide advice, education, support and counseling about physical activity/exercise needs.;Develop an individualized exercise prescription for aerobic and resistive training based on initial evaluation findings, risk stratification, comorbidities and participant's personal goals.       Expected Outcomes Short  Term: Attend rehab on a regular basis to increase amount of physical activity.;Long Term: Add in home exercise to make exercise part of routine and to increase amount of physical activity.;Long Term: Exercising regularly at least 3-5 days a week.       Increase Strength and Stamina Yes       Intervention Provide advice, education, support and counseling about physical activity/exercise needs.;Develop an individualized exercise prescription for aerobic and resistive training based on initial evaluation findings, risk stratification, comorbidities and participant's personal goals.       Expected Outcomes Short Term: Increase workloads from initial exercise prescription for resistance, speed, and METs.;Short Term: Perform resistance training exercises routinely during rehab and add in resistance training at home;Long Term: Improve cardiorespiratory fitness, muscular endurance and strength as measured by increased METs and functional capacity (6MWT)       Able to understand and use rate of perceived exertion (RPE) scale Yes       Intervention Provide education and explanation on how to use RPE scale       Expected Outcomes Short Term: Able to use RPE daily in rehab to express subjective intensity level;Long Term:  Able to use RPE to guide intensity level when exercising independently       Knowledge and understanding of Target Heart Rate Range (THRR) Yes       Intervention Provide education and explanation of THRR including how the numbers were predicted and where they are located for reference       Expected Outcomes Short Term: Able to state/look up THRR;Long Term: Able to use THRR to govern intensity when exercising independently;Short Term: Able to use daily as guideline for intensity in rehab       Understanding of Exercise Prescription Yes       Intervention Provide education, explanation, and written materials on patient's individual exercise prescription       Expected Outcomes Short Term: Able to  explain program exercise prescription;Long Term: Able to explain home exercise prescription to exercise independently              Exercise Goals Re-Evaluation :  Exercise Goals Re-Evaluation    Row Name 06/15/20 1211 06/24/20 1050 07/27/20 1134         Exercise Goal Re-Evaluation   Exercise Goals Review Able to understand and use rate of perceived exertion (RPE) scale;Increase Physical Activity Able to understand and use rate of perceived exertion (RPE) scale;Increase Physical Activity;Understanding of Exercise Prescription;Able to check pulse independently;Increase Strength and Stamina;Knowledge and understanding of Target Heart Rate Range (THRR) Able to understand and use rate of perceived exertion (RPE) scale;Increase Physical Activity;Understanding of Exercise Prescription;Able to check pulse independently;Increase  Strength and Stamina;Knowledge and understanding of Target Heart Rate Range (THRR)     Comments Patient is able to understand and use RPE scale appropriately. Patient is currently walking 30 minutes daily as his mode of home exercise. Patient stretches daily and has a total gym at home but hasn't used it because of back pain, per patient L4 & L5 are "collapsed". We discussed some of the weight exercises and stretches to avoid because they hurt his back. His goal is to build muscle and stamina because now he gets tired quickly. Patient continues to walk ~2 miles daily, at least 20 minutes. Patient feels that his legs are getting stronger, although he has some soreness. Encouraged patient to stretch to help alleviate soreness. Patient has gotten back to doing yard work, which was one of his goals. Patient stated that he was sore after raking and working in the yard this weekend. Encouraged patient to verify with his physician that he's cleared to do heavy yard work and to pace himself. Patient verbalizes understanding.     Expected Outcomes Progress workloads as tolerated to help achieve  personal health and fitness goals. Continue to progress workloads as tolerated to help build strength and stamina. Patient will continue current exercise routine to continue to help increase leg strength.            Discharge Exercise Prescription (Final Exercise Prescription Changes):  Exercise Prescription Changes - 07/27/20 1051      Response to Exercise   Blood Pressure (Admit) 136/68    Blood Pressure (Exercise) 128/72    Blood Pressure (Exit) 128/82    Heart Rate (Admit) 80 bpm    Heart Rate (Exercise) 81 bpm    Heart Rate (Exit) 76 bpm    Rating of Perceived Exertion (Exercise) 13    Symptoms none    Duration Continue with 30 min of aerobic exercise without signs/symptoms of physical distress.    Intensity THRR unchanged      Progression   Progression Continue to progress workloads to maintain intensity without signs/symptoms of physical distress.    Average METs 2.1      Resistance Training   Training Prescription No      Interval Training   Interval Training No      Recumbant Bike   Level 5    Minutes 15    METs 2      NuStep   Level 5    SPM 85    Minutes 15    METs 2.3      Home Exercise Plan   Plans to continue exercise at Home (comment)   Walking   Frequency Add 4 additional days to program exercise sessions.    Initial Home Exercises Provided 06/24/20           Nutrition:  Target Goals: Understanding of nutrition guidelines, daily intake of sodium <1586m, cholesterol <2053m calories 30% from fat and 7% or less from saturated fats, daily to have 5 or more servings of fruits and vegetables.  Biometrics:   Post Biometrics - 06/11/20 1000       Post  Biometrics   Waist Circumference 40 inches    Hip Circumference 42.25 inches    Waist to Hip Ratio 0.95 %    Triceps Skinfold 17 mm    % Body Fat 27.9 %    Grip Strength 39 kg    Flexibility 0 in   pt unable to reach   Single Leg Stand 8 seconds  Nutrition Therapy Plan and  Nutrition Goals:  Nutrition Therapy & Goals - 06/24/20 1207      Nutrition Therapy   Diet TLC    Drug/Food Interactions Statins/Certain Fruits;Coumadin/Vit K      Personal Nutrition Goals   Nutrition Goal Pt to carry water bottle to increase water intake    Personal Goal #2 Pt to reduce saturated fats by cutting back on processed and red meats    Personal Goal #3 Pt to eliminate salt shaker to reduce sodium intake      Intervention Plan   Intervention Prescribe, educate and counsel regarding individualized specific dietary modifications aiming towards targeted core components such as weight, hypertension, lipid management, diabetes, heart failure and other comorbidities.;Nutrition handout(s) given to patient.    Expected Outcomes Short Term Goal: A plan has been developed with personal nutrition goals set during dietitian appointment.;Long Term Goal: Adherence to prescribed nutrition plan.           Nutrition Assessments:  MEDIFICTS Score Key:  ?70 Need to make dietary changes   40-70 Heart Healthy Diet  ? 40 Therapeutic Level Cholesterol Diet   Flowsheet Row CARDIAC REHAB PHASE II EXERCISE from 06/22/2020 in [facility]  Picture Your Plate Total Score on Admission 36     Picture Your Plate Scores:  <06 Unhealthy dietary pattern with much room for improvement.  41-50 Dietary pattern unlikely to meet recommendations for good health and room for improvement.  51-60 More healthful dietary pattern, with some room for improvement.   >60 Healthy dietary pattern, although there may be some specific behaviors that could be improved.    Nutrition Goals Re-Evaluation:  Nutrition Goals Re-Evaluation    [place] Name 06/24/20 1207 07/24/20 1332           Goals   Current Weight 179 lb (81.2 kg) 181 lb 7 oz (82.3 kg)      Nutrition Goal -- Pt to carry water bottle to increase water intake      Comment -- Pt has reduced saturated fat and sodium              Personal Goal #2 Re-Evaluation   Personal Goal #2 -- Pt to reduce saturated fats by cutting back on processed and red meats             Personal Goal #3 Re-Evaluation   Personal Goal #3 -- Pt to eliminate salt shaker to reduce sodium intake             Nutrition Goals Re-Evaluation:  Nutrition Goals Re-Evaluation    [place] Name 06/24/20 1207 07/24/20 1332           Goals   Current Weight 179 lb (81.2 kg) 181 lb 7 oz (82.3 kg)      Nutrition Goal -- Pt to carry water bottle to increase water intake      Comment -- Pt has reduced saturated fat and sodium             Personal Goal #2 Re-Evaluation   Personal Goal #2 -- Pt to reduce saturated fats by cutting back on processed and red meats             Personal Goal #3 Re-Evaluation   Personal Goal #3 -- Pt to eliminate salt shaker to reduce sodium intake             Nutrition Goals Discharge (Final Nutrition Goals Re-Evaluation):  Nutrition Goals Re-Evaluation - 07/24/20 1332  Goals   Current Weight 181 lb 7 oz (82.3 kg)    Nutrition Goal Pt to carry water bottle to increase water intake    Comment Pt has reduced saturated fat and sodium      Personal Goal #2 Re-Evaluation   Personal Goal #2 Pt to reduce saturated fats by cutting back on processed and red meats      Personal Goal #3 Re-Evaluation   Personal Goal #3 Pt to eliminate salt shaker to reduce sodium intake           Psychosocial: Target Goals: Acknowledge presence or absence of significant depression and/or stress, maximize coping skills, provide positive support system. Participant is able to verbalize types and ability to use techniques and skills needed for reducing stress and depression.  Initial Review & Psychosocial Screening:  Initial Psych Review & Screening - 06/11/20 1544      Initial Review   Current issues with None Identified      Family Dynamics   Good Support System? Yes   Ray has his wife and his wife's family for support      Barriers   Psychosocial barriers to participate in program There are no identifiable barriers or psychosocial needs.      Screening Interventions   Interventions Encouraged to exercise           Quality of Life Scores:  Quality of Life - 06/11/20 1321      Quality of Life   Select Quality of Life      Quality of Life Scores   Health/Function Pre 27.37 %    Socioeconomic Pre 28.5 %    Psych/Spiritual Pre 27.86 %    Family Pre 27.1 %    GLOBAL Pre 27.61 %          Scores of 19 and below usually indicate a poorer quality of life in these areas.  A difference of  2-3 points is a clinically meaningful difference.  A difference of 2-3 points in the total score of the Quality of Life Index has been associated with significant improvement in overall quality of life, self-image, physical symptoms, and general health in studies assessing change in quality of life.  PHQ-9: Recent Review Flowsheet Data    Depression screen [facility] 2/9 07/06/2020 06/11/2020 06/02/2020 05/08/2020 11/14/2019   Decreased Interest 0 0 0 0 0   Down, Depressed, Hopeless 0 - 0 0 0   PHQ - 2 Score 0 0 0 0 0   Altered sleeping - 0 1 - -   Tired, decreased energy - - 1 - -   Change in appetite - - 0 - -   Feeling bad or failure about yourself  - - 0 - -   Trouble concentrating - - 0 - -   Moving slowly or fidgety/restless - - 0 - -   Suicidal thoughts - - 0 - -   PHQ-9 Score - 0 2 - -   Difficult doing work/chores - - Not difficult at all - -     Interpretation of Total Score  Total Score Depression Severity:  1-4 = Minimal depression, 5-9 = Mild depression, 10-14 = Moderate depression, 15-19 = Moderately severe depression, 20-27 = Severe depression   Psychosocial Evaluation and Intervention:   Psychosocial Re-Evaluation:  Psychosocial Re-Evaluation    [place] Name 06/30/20 0745 07/27/20 1708           Psychosocial Re-Evaluation   Current issues with None Identified None Identified  Interventions  Encouraged to attend Cardiac Rehabilitation for the exercise Encouraged to attend Cardiac Rehabilitation for the exercise      Continue Psychosocial Services  No Follow up required No Follow up required             Psychosocial Discharge (Final Psychosocial Re-Evaluation):  Psychosocial Re-Evaluation - 07/27/20 1708      Psychosocial Re-Evaluation   Current issues with None Identified    Interventions Encouraged to attend Cardiac Rehabilitation for the exercise    Continue Psychosocial Services  No Follow up required           Vocational Rehabilitation: Provide vocational rehab assistance to qualifying candidates.   Vocational Rehab Evaluation & Intervention:  Vocational Rehab - 06/11/20 1545      Initial Vocational Rehab Evaluation & Intervention   Assessment shows need for Vocational Rehabilitation No   Ray is retired and does not need vocatinal rehab at this time          Education: Education Goals: Education classes will be provided on a weekly basis, covering required topics. Participant will state understanding/return demonstration of topics presented.  Learning Barriers/Preferences:  Learning Barriers/Preferences - 06/11/20 1324      Learning Barriers/Preferences   Learning Barriers Exercise Concerns;Hearing   Balance concerns: dizziness   Learning Preferences Written Material;Video;Computer/Internet;Pictoral           Education Topics: Count Your Pulse:  -Group instruction provided by verbal instruction, demonstration, patient participation and written materials to support subject.  Instructors address importance of being able to find your pulse and how to count your pulse when at home without a heart monitor.  Patients get hands on experience counting their pulse with staff help and individually.   Heart Attack, Angina, and Risk Factor Modification:  -Group instruction provided by verbal instruction, video, and written materials to support subject.   Instructors address signs and symptoms of angina and heart attacks.    Also discuss risk factors for heart disease and how to make changes to improve heart health risk factors.   Functional Fitness:  -Group instruction provided by verbal instruction, demonstration, patient participation, and written materials to support subject.  Instructors address safety measures for doing things around the house.  Discuss how to get up and down off the floor, how to pick things up properly, how to safely get out of a chair without assistance, and balance training.   Meditation and Mindfulness:  -Group instruction provided by verbal instruction, patient participation, and written materials to support subject.  Instructor addresses importance of mindfulness and meditation practice to help reduce stress and improve awareness.  Instructor also leads participants through a meditation exercise.    Stretching for Flexibility and Mobility:  -Group instruction provided by verbal instruction, patient participation, and written materials to support subject.  Instructors lead participants through series of stretches that are designed to increase flexibility thus improving mobility.  These stretches are additional exercise for major muscle groups that are typically performed during regular warm up and cool down.   Hands Only CPR:  -Group verbal, video, and participation provides a basic overview of AHA guidelines for community CPR. Role-play of emergencies allow participants the opportunity to practice calling for help and chest compression technique with discussion of AED use.   Hypertension: -Group verbal and written instruction that provides a basic overview of hypertension including the most recent diagnostic guidelines, risk factor reduction with self-care instructions and medication management.    Nutrition I class: Heart Healthy Eating:  -  Group instruction provided by PowerPoint slides, verbal discussion, and  written materials to support subject matter. The instructor gives an explanation and review of the Therapeutic Lifestyle Changes diet recommendations, which includes a discussion on lipid goals, dietary fat, sodium, fiber, plant stanol/sterol esters, sugar, and the components of a well-balanced, healthy diet.   Nutrition II class: Lifestyle Skills:  -Group instruction provided by PowerPoint slides, verbal discussion, and written materials to support subject matter. The instructor gives an explanation and review of label reading, grocery shopping for heart health, heart healthy recipe modifications, and ways to make healthier choices when eating out.   Diabetes Question & Answer:  -Group instruction provided by PowerPoint slides, verbal discussion, and written materials to support subject matter. The instructor gives an explanation and review of diabetes co-morbidities, pre- and post-prandial blood glucose goals, pre-exercise blood glucose goals, signs, symptoms, and treatment of hypoglycemia and hyperglycemia, and foot care basics.   Diabetes Blitz:  -Group instruction provided by PowerPoint slides, verbal discussion, and written materials to support subject matter. The instructor gives an explanation and review of the physiology behind type 1 and type 2 diabetes, diabetes medications and rational behind using different medications, pre- and post-prandial blood glucose recommendations and Hemoglobin A1c goals, diabetes diet, and exercise including blood glucose guidelines for exercising safely.    Portion Distortion:  -Group instruction provided by PowerPoint slides, verbal discussion, written materials, and food models to support subject matter. The instructor gives an explanation of serving size versus portion size, changes in portions sizes over the last 20 years, and what consists of a serving from each food group.   Stress Management:  -Group instruction provided by verbal instruction,  video, and written materials to support subject matter.  Instructors review role of stress in heart disease and how to cope with stress positively.     Exercising on Your Own:  -Group instruction provided by verbal instruction, power point, and written materials to support subject.  Instructors discuss benefits of exercise, components of exercise, frequency and intensity of exercise, and end points for exercise.  Also discuss use of nitroglycerin and activating EMS.  Review options of places to exercise outside of rehab.  Review guidelines for sex with heart disease.   Cardiac Drugs I:  -Group instruction provided by verbal instruction and written materials to support subject.  Instructor reviews cardiac drug classes: antiplatelets, anticoagulants, beta blockers, and statins.  Instructor discusses reasons, side effects, and lifestyle considerations for each drug class.   Cardiac Drugs II:  -Group instruction provided by verbal instruction and written materials to support subject.  Instructor reviews cardiac drug classes: angiotensin converting enzyme inhibitors (ACE-I), angiotensin II receptor blockers (ARBs), nitrates, and calcium channel blockers.  Instructor discusses reasons, side effects, and lifestyle considerations for each drug class.   Anatomy and Physiology of the Circulatory System:  Group verbal and written instruction and models provide basic cardiac anatomy and physiology, with the coronary electrical and arterial systems. Review of: AMI, Angina, Valve disease, Heart Failure, Peripheral Artery Disease, Cardiac Arrhythmia, Pacemakers, and the ICD.   Other Education:  -Group or individual verbal, written, or video instructions that support the educational goals of the cardiac rehab program.   Holiday Eating Survival Tips:  -Group instruction provided by PowerPoint slides, verbal discussion, and written materials to support subject matter. The instructor gives patients tips,  tricks, and techniques to help them not only survive but enjoy the holidays despite the onslaught of food that accompanies the holidays.   Knowledge  Questionnaire Score:  Knowledge Questionnaire Score - 06/11/20 1323      Knowledge Questionnaire Score   Pre Score 17/24           Core Components/Risk Factors/Patient Goals at Admission:  Personal Goals and Risk Factors at Admission - 06/11/20 1329      Core Components/Risk Factors/Patient Goals on Admission    Weight Management Weight Maintenance;Yes    Intervention Weight Management: Develop a combined nutrition and exercise program designed to reach desired caloric intake, while maintaining appropriate intake of nutrient and fiber, sodium and fats, and appropriate energy expenditure required for the weight goal.;Weight Management: Provide education and appropriate resources to help participant work on and attain dietary goals.    Hypertension Yes    Intervention Provide education on lifestyle modifcations including regular physical activity/exercise, weight management, moderate sodium restriction and increased consumption of fresh fruit, vegetables, and low fat dairy, alcohol moderation, and smoking cessation.;Monitor prescription use compliance.    Expected Outcomes Short Term: Continued assessment and intervention until BP is < 140/67m HG in hypertensive participants. < 130/862mHG in hypertensive participants with diabetes, heart failure or chronic kidney disease.;Long Term: Maintenance of blood pressure at goal levels.    Lipids Yes    Intervention Provide education and support for participant on nutrition & aerobic/resistive exercise along with prescribed medications to achieve LDL <7067mHDL >27m30m  Expected Outcomes Short Term: Participant states understanding of desired cholesterol values and is compliant with medications prescribed. Participant is following exercise prescription and nutrition guidelines.;Long Term: Cholesterol  controlled with medications as prescribed, with individualized exercise RX and with personalized nutrition plan. Value goals: LDL < 70mg84mL > 40 mg.    Personal Goal Other Yes    Personal Goal Pt wants to regain strength in legs and heart health           Core Components/Risk Factors/Patient Goals Review:   Goals and Risk Factor Review    Row Name 06/30/20 0746 07/27/20 1708           Core Components/Risk Factors/Patient Goals Review   Personal Goals Review Weight Management/Obesity;Hypertension;Lipids Weight Management/Obesity;Hypertension;Lipids      Review Ray has been doing well with exercise at cardiac rehab. Vital signs have been stable Raycontinues to do well with exercise at cardiac rehab. Vital signs have been stable      Expected Outcomes Ray will continue to participte in phase 2 cardiac rehab for exercise, nutrition and lifestyle modification Ray will continue to participte in phase 2 cardiac rehab for exercise, nutrition and lifestyle modification             Core Components/Risk Factors/Patient Goals at Discharge (Final Review):   Goals and Risk Factor Review - 07/27/20 1708      Core Components/Risk Factors/Patient Goals Review   Personal Goals Review Weight Management/Obesity;Hypertension;Lipids    Review Raycontinues to do well with exercise at cardiac rehab. Vital signs have been stable    Expected Outcomes Ray will continue to participte in phase 2 cardiac rehab for exercise, nutrition and lifestyle modification           ITP Comments:  ITP Comments    Row Name 06/11/20 1537 06/30/20 0744 07/27/20 1707       ITP Comments Dr TraciFransico HimMedical Director 30 Day ITP Review. Ray has good participation and attendance in phase 2 cardiac rehab 30 Day ITP Review. Ray continues  good participation and attendance in phase 2 cardiac rehab. Patient reported having  lower back pain on 07/27/20 did not use weights            Comments: See ITP Comments

## 2020-07-29 ENCOUNTER — Other Ambulatory Visit: Payer: Self-pay

## 2020-07-29 ENCOUNTER — Encounter (HOSPITAL_COMMUNITY)
Admission: RE | Admit: 2020-07-29 | Discharge: 2020-07-29 | Disposition: A | Discharge status: Home / Self Care | Payer: Medicare Other | Source: Ambulatory Visit | Attending: Cardiovascular Disease | Admitting: Cardiovascular Disease

## 2020-07-29 DIAGNOSIS — Z9889 Other specified postprocedural states: Secondary | ICD-10-CM

## 2020-07-29 NOTE — Progress Notes (Signed)
Incomplete Session Note  Patient Details  Name: Patrick Bell MRN: [id number] Date of Birth: Jun 07, 1940 Referring Provider:   Flowsheet Row CARDIAC REHAB PHASE II ORIENTATION from 06/11/2020 in [place]  Referring Provider Dr. Shelva Majestic MD      [name] did not complete his rehab session.  Ray reported feeling lightheaded in the waiting room prior to check. Ray admits to drinking only 8 ounces of water today.  Sitting BP 112/68. Standing BP 108/68. I advised Ray not to exercise today. Patient given 10 ounces of of water and encouraged to hydrate before coming to exercise at cardiac rehab. Patient states understanding and plans to return on Friday. Ray left cardiac rehab without symptoms.Barnet Pall, RN,BSN 07/29/2020 11:37 AM

## 2020-07-31 ENCOUNTER — Other Ambulatory Visit: Payer: Self-pay

## 2020-07-31 ENCOUNTER — Encounter (HOSPITAL_COMMUNITY)
Admission: RE | Admit: 2020-07-31 | Discharge: 2020-07-31 | Disposition: A | Discharge status: Home / Self Care | Payer: Medicare Other | Source: Ambulatory Visit | Attending: Cardiovascular Disease | Admitting: Cardiovascular Disease

## 2020-07-31 DIAGNOSIS — Z9889 Other specified postprocedural states: Secondary | ICD-10-CM | POA: Diagnosis not present

## 2020-08-03 ENCOUNTER — Other Ambulatory Visit: Payer: Self-pay

## 2020-08-03 ENCOUNTER — Encounter (HOSPITAL_COMMUNITY)
Admission: RE | Admit: 2020-08-03 | Discharge: 2020-08-03 | Disposition: A | Discharge status: Home / Self Care | Payer: Medicare Other | Source: Ambulatory Visit | Attending: Cardiovascular Disease | Admitting: Cardiovascular Disease

## 2020-08-03 ENCOUNTER — Ambulatory Visit (INDEPENDENT_AMBULATORY_CARE_PROVIDER_SITE_OTHER): Payer: Medicare Other

## 2020-08-03 DIAGNOSIS — I7101 Dissection of thoracic aorta: Secondary | ICD-10-CM | POA: Diagnosis not present

## 2020-08-03 DIAGNOSIS — Z5181 Encounter for therapeutic drug level monitoring: Secondary | ICD-10-CM | POA: Diagnosis not present

## 2020-08-03 DIAGNOSIS — Z9889 Other specified postprocedural states: Secondary | ICD-10-CM | POA: Diagnosis not present

## 2020-08-03 DIAGNOSIS — I71019 Dissection of thoracic aorta, unspecified: Secondary | ICD-10-CM

## 2020-08-03 LAB — POCT INR: INR: 2.7 (ref 2.0–3.0)

## 2020-08-03 NOTE — Patient Instructions (Signed)
Change dose to 1 tablet daily except 1/2 tablet each Sunday.  Repeat INR in 5 weeks

## 2020-08-05 ENCOUNTER — Encounter (HOSPITAL_COMMUNITY)
Admission: RE | Admit: 2020-08-05 | Discharge: 2020-08-05 | Disposition: A | Discharge status: Home / Self Care | Payer: Medicare Other | Source: Ambulatory Visit | Attending: Cardiovascular Disease | Admitting: Cardiovascular Disease

## 2020-08-05 ENCOUNTER — Other Ambulatory Visit: Payer: Self-pay

## 2020-08-05 DIAGNOSIS — Z9889 Other specified postprocedural states: Secondary | ICD-10-CM

## 2020-08-07 ENCOUNTER — Other Ambulatory Visit: Payer: Self-pay

## 2020-08-07 ENCOUNTER — Encounter (HOSPITAL_COMMUNITY)
Admission: RE | Admit: 2020-08-07 | Discharge: 2020-08-07 | Disposition: A | Discharge status: Home / Self Care | Payer: Medicare Other | Source: Ambulatory Visit | Attending: Cardiovascular Disease | Admitting: Cardiovascular Disease

## 2020-08-07 VITALS — BP 142/76 | HR 79 | Ht 69.25 in | Wt 181.0 lb

## 2020-08-07 DIAGNOSIS — Z9889 Other specified postprocedural states: Secondary | ICD-10-CM | POA: Diagnosis not present
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
## 2020-08-11 DIAGNOSIS — M9904 Segmental and somatic dysfunction of sacral region: Secondary | ICD-10-CM | POA: Diagnosis not present

## 2020-08-11 DIAGNOSIS — M9905 Segmental and somatic dysfunction of pelvic region: Secondary | ICD-10-CM | POA: Diagnosis not present

## 2020-08-11 DIAGNOSIS — M9903 Segmental and somatic dysfunction of lumbar region: Secondary | ICD-10-CM | POA: Diagnosis not present

## 2020-08-11 DIAGNOSIS — M5137 Other intervertebral disc degeneration, lumbosacral region: Secondary | ICD-10-CM | POA: Diagnosis not present

## 2020-08-13 DIAGNOSIS — M9903 Segmental and somatic dysfunction of lumbar region: Secondary | ICD-10-CM | POA: Diagnosis not present

## 2020-08-13 DIAGNOSIS — M9905 Segmental and somatic dysfunction of pelvic region: Secondary | ICD-10-CM | POA: Diagnosis not present

## 2020-08-13 DIAGNOSIS — M9904 Segmental and somatic dysfunction of sacral region: Secondary | ICD-10-CM | POA: Diagnosis not present

## 2020-08-13 DIAGNOSIS — M5137 Other intervertebral disc degeneration, lumbosacral region: Secondary | ICD-10-CM | POA: Diagnosis not present

## 2020-08-18 DIAGNOSIS — M9905 Segmental and somatic dysfunction of pelvic region: Secondary | ICD-10-CM | POA: Diagnosis not present

## 2020-08-18 DIAGNOSIS — M9904 Segmental and somatic dysfunction of sacral region: Secondary | ICD-10-CM | POA: Diagnosis not present

## 2020-08-18 DIAGNOSIS — M9903 Segmental and somatic dysfunction of lumbar region: Secondary | ICD-10-CM | POA: Diagnosis not present

## 2020-08-18 DIAGNOSIS — M5137 Other intervertebral disc degeneration, lumbosacral region: Secondary | ICD-10-CM | POA: Diagnosis not present

## 2020-08-24 ENCOUNTER — Encounter: Payer: Medicare Other | Admitting: Thoracic Surgery (Cardiothoracic Vascular Surgery)

## 2020-08-24 ENCOUNTER — Other Ambulatory Visit: Payer: Self-pay

## 2020-08-24 ENCOUNTER — Telehealth: Payer: Medicare Other | Admitting: Thoracic Surgery (Cardiothoracic Vascular Surgery)

## 2020-08-24 NOTE — Progress Notes (Signed)
      [address]       [place] [address]             [phone]     CARDIOTHORACIC SURGERY TELEPHONE VIRTUAL OFFICE NOTE  Primary Cardiologist is Shelva Majestic, MD PCP is Dettinger, Fransisca Kaufmann, MD   HPI:  I spoke with Patrick Bell (DOB 1940/05/04 ) via telephone on 08/24/2020 at 5:02 PM and verified that I was speaking with the correct person using more than one form of identification.  We discussed the fact that I was contacting them from my office and they were located at home, as well as the reason(s) for conducting our visit virtually instead of in-person.  The patient expressed understanding the circumstances and agreed to proceed as described.   This patient's planned virtual appointment today was to review results of a follow-up CT angiogram which has not yet been performed.  I spoke over the telephone with the patient and offered to make sure that my office got the patient scheduled for follow-up CT angiogram.  We will reschedule his follow-up visit until after the scan has been performed so that we may review the results.  The patient reports that he is otherwise doing quite well at this time.   Current Outpatient Medications  Medication Sig Dispense Refill  . acetaminophen (TYLENOL) 325 MG tablet Take 1-2 tablets (325-650 mg total) by mouth every 4 (four) hours as needed for mild pain.    Marland Kitchen albuterol (PROAIR HFA) 108 (90 Base) MCG/ACT inhaler Inhale 2 puffs into the lungs every 6 (six) hours as needed for wheezing. 8.5 g 2  . amLODipine (NORVASC) 2.5 MG tablet Take 1 tablet (2.5 mg total) by mouth daily. 90 tablet 3  . levothyroxine (SYNTHROID) 50 MCG tablet Take 1 tablet (50 mcg total) by mouth daily. 90 tablet 3  . metoprolol succinate (TOPROL-XL) 50 MG 24 hr tablet Take 1 tablet (50 mg total) by mouth daily. Take with or immediately following a meal. 90 tablet 3  . montelukast (SINGULAIR) 10 MG tablet Take 1 tablet (10 mg total) daily by mouth. 90 tablet 3   . Multiple Vitamin (MULTIVITAMIN WITH MINERALS) TABS tablet Take 1 tablet by mouth daily.    . Olopatadine HCl 0.2 % SOLN Place 1 drop into both eyes daily as needed (allergies).    . pantoprazole (PROTONIX) 40 MG tablet Take 1 tablet (40 mg total) daily by mouth. 90 tablet 3  . simvastatin (ZOCOR) 10 MG tablet Take 1 tablet (10 mg total) by mouth at bedtime. 90 tablet 3  . warfarin (COUMADIN) 5 MG tablet Take 1 tablet (5 mg total) by mouth daily at 4 PM. 30 tablet 3   No current facility-administered medications for this visit.     Diagnostic Tests:  n/a   Impression:  Patient needs follow-up CT angiogram  Plan:  Will schedule CT angiogram and review the results with the patient at a later date.       Valentina Gu. Roxy Manns, MD 08/24/2020 5:02 PM

## 2020-08-25 ENCOUNTER — Other Ambulatory Visit: Payer: Self-pay | Admitting: *Deleted

## 2020-08-25 DIAGNOSIS — I71019 Dissection of thoracic aorta, unspecified: Secondary | ICD-10-CM

## 2020-08-25 DIAGNOSIS — I7101 Dissection of thoracic aorta: Secondary | ICD-10-CM

## 2020-09-07 ENCOUNTER — Other Ambulatory Visit: Payer: Self-pay

## 2020-09-07 ENCOUNTER — Ambulatory Visit (INDEPENDENT_AMBULATORY_CARE_PROVIDER_SITE_OTHER): Payer: Medicare Other

## 2020-09-07 DIAGNOSIS — I71019 Dissection of thoracic aorta, unspecified: Secondary | ICD-10-CM

## 2020-09-07 DIAGNOSIS — I7101 Dissection of thoracic aorta: Secondary | ICD-10-CM

## 2020-09-07 DIAGNOSIS — Z9889 Other specified postprocedural states: Secondary | ICD-10-CM | POA: Diagnosis not present

## 2020-09-07 DIAGNOSIS — Z5181 Encounter for therapeutic drug level monitoring: Secondary | ICD-10-CM

## 2020-09-07 LAB — POCT INR: INR: 1.5 — AB (ref 2.0–3.0)

## 2020-09-07 NOTE — Patient Instructions (Signed)
Take 2 tablets today only and then continue 1 tablet daily except 1/2 tablet each Sunday.  Repeat INR in 4 weeks

## 2020-09-08 DIAGNOSIS — I440 Atrioventricular block, first degree: Secondary | ICD-10-CM | POA: Diagnosis not present

## 2020-09-08 DIAGNOSIS — R0789 Other chest pain: Secondary | ICD-10-CM | POA: Diagnosis not present

## 2020-09-08 DIAGNOSIS — R079 Chest pain, unspecified: Secondary | ICD-10-CM | POA: Diagnosis not present

## 2020-09-09 DIAGNOSIS — G4733 Obstructive sleep apnea (adult) (pediatric): Secondary | ICD-10-CM | POA: Diagnosis not present

## 2020-09-10 ENCOUNTER — Emergency Department (HOSPITAL_COMMUNITY): Payer: Medicare Other

## 2020-09-10 ENCOUNTER — Encounter: Payer: Self-pay | Admitting: Family Medicine

## 2020-09-10 ENCOUNTER — Emergency Department (HOSPITAL_COMMUNITY)
Admission: EM | Admit: 2020-09-10 | Discharge: 2020-09-10 | Disposition: A | Discharge status: Home / Self Care | Payer: Medicare Other | Attending: Emergency Medicine | Admitting: Emergency Medicine

## 2020-09-10 ENCOUNTER — Ambulatory Visit (INDEPENDENT_AMBULATORY_CARE_PROVIDER_SITE_OTHER): Payer: Medicare Other | Admitting: Family Medicine

## 2020-09-10 ENCOUNTER — Other Ambulatory Visit: Payer: Self-pay

## 2020-09-10 VITALS — BP 69/47 | HR 85 | Ht 69.25 in | Wt 179.1 lb

## 2020-09-10 DIAGNOSIS — Z743 Need for continuous supervision: Secondary | ICD-10-CM | POA: Diagnosis not present

## 2020-09-10 DIAGNOSIS — Z79899 Other long term (current) drug therapy: Secondary | ICD-10-CM | POA: Diagnosis not present

## 2020-09-10 DIAGNOSIS — I10 Essential (primary) hypertension: Secondary | ICD-10-CM | POA: Insufficient documentation

## 2020-09-10 DIAGNOSIS — K5750 Diverticulosis of both small and large intestine without perforation or abscess without bleeding: Secondary | ICD-10-CM | POA: Diagnosis not present

## 2020-09-10 DIAGNOSIS — Z7901 Long term (current) use of anticoagulants: Secondary | ICD-10-CM | POA: Insufficient documentation

## 2020-09-10 DIAGNOSIS — I7772 Dissection of iliac artery: Secondary | ICD-10-CM | POA: Diagnosis not present

## 2020-09-10 DIAGNOSIS — J45909 Unspecified asthma, uncomplicated: Secondary | ICD-10-CM | POA: Diagnosis not present

## 2020-09-10 DIAGNOSIS — Z20822 Contact with and (suspected) exposure to covid-19: Secondary | ICD-10-CM | POA: Diagnosis not present

## 2020-09-10 DIAGNOSIS — Z87891 Personal history of nicotine dependence: Secondary | ICD-10-CM | POA: Insufficient documentation

## 2020-09-10 DIAGNOSIS — R42 Dizziness and giddiness: Secondary | ICD-10-CM | POA: Diagnosis not present

## 2020-09-10 DIAGNOSIS — E039 Hypothyroidism, unspecified: Secondary | ICD-10-CM | POA: Diagnosis not present

## 2020-09-10 DIAGNOSIS — I499 Cardiac arrhythmia, unspecified: Secondary | ICD-10-CM | POA: Diagnosis not present

## 2020-09-10 DIAGNOSIS — Z8679 Personal history of other diseases of the circulatory system: Secondary | ICD-10-CM

## 2020-09-10 DIAGNOSIS — R404 Transient alteration of awareness: Secondary | ICD-10-CM | POA: Diagnosis not present

## 2020-09-10 DIAGNOSIS — I440 Atrioventricular block, first degree: Secondary | ICD-10-CM | POA: Diagnosis not present

## 2020-09-10 DIAGNOSIS — R079 Chest pain, unspecified: Secondary | ICD-10-CM

## 2020-09-10 DIAGNOSIS — I7102 Dissection of abdominal aorta: Secondary | ICD-10-CM | POA: Diagnosis not present

## 2020-09-10 DIAGNOSIS — R0789 Other chest pain: Secondary | ICD-10-CM | POA: Diagnosis not present

## 2020-09-10 DIAGNOSIS — I7101 Dissection of thoracic aorta: Secondary | ICD-10-CM | POA: Diagnosis not present

## 2020-09-10 DIAGNOSIS — M549 Dorsalgia, unspecified: Secondary | ICD-10-CM | POA: Diagnosis not present

## 2020-09-10 LAB — RESP PANEL BY RT-PCR (FLU A&B, COVID) ARPGX2
Influenza A by PCR: NEGATIVE
Influenza B by PCR: NEGATIVE
SARS Coronavirus 2 by RT PCR: NEGATIVE

## 2020-09-10 LAB — TYPE AND SCREEN
ABO/RH(D): A POS
Antibody Screen: NEGATIVE

## 2020-09-10 LAB — TROPONIN I (HIGH SENSITIVITY)
Troponin I (High Sensitivity): 9 ng/L (ref <18)
Troponin I (High Sensitivity): 9 ng/L (ref <18)

## 2020-09-10 LAB — BRAIN NATRIURETIC PEPTIDE: B Natriuretic Peptide: 112.7 pg/mL — ABNORMAL HIGH (ref 0.0–100.0)

## 2020-09-10 LAB — CBC WITH DIFFERENTIAL/PLATELET
Abs Immature Granulocytes: 0.01 10*3/uL (ref 0.00–0.07)
Basophils Absolute: 0 10*3/uL (ref 0.0–0.1)
Basophils Relative: 0 %
Eosinophils Absolute: 0 10*3/uL (ref 0.0–0.5)
Eosinophils Relative: 1 %
HCT: 41.2 % (ref 39.0–52.0)
Hemoglobin: 12.9 g/dL — ABNORMAL LOW (ref 13.0–17.0)
Immature Granulocytes: 0 %
Lymphocytes Relative: 22 %
Lymphs Abs: 0.7 10*3/uL (ref 0.7–4.0)
MCH: 27.2 pg (ref 26.0–34.0)
MCHC: 31.3 g/dL (ref 30.0–36.0)
MCV: 86.7 fL (ref 80.0–100.0)
Monocytes Absolute: 0.6 10*3/uL (ref 0.1–1.0)
Monocytes Relative: 20 %
Neutro Abs: 1.7 10*3/uL (ref 1.7–7.7)
Neutrophils Relative %: 57 %
Platelets: 171 10*3/uL (ref 150–400)
RBC: 4.75 MIL/uL (ref 4.22–5.81)
RDW: 15.9 % — ABNORMAL HIGH (ref 11.5–15.5)
WBC: 3 10*3/uL — ABNORMAL LOW (ref 4.0–10.5)
nRBC: 0 % (ref 0.0–0.2)

## 2020-09-10 LAB — I-STAT CHEM 8, ED
BUN: 22 mg/dL (ref 8–23)
Calcium, Ion: 1.1 mmol/L — ABNORMAL LOW (ref 1.15–1.40)
Chloride: 107 mmol/L (ref 98–111)
Creatinine, Ser: 1.2 mg/dL (ref 0.61–1.24)
Glucose, Bld: 78 mg/dL (ref 70–99)
HCT: 39 % (ref 39.0–52.0)
Hemoglobin: 13.3 g/dL (ref 13.0–17.0)
Potassium: 4.1 mmol/L (ref 3.5–5.1)
Sodium: 141 mmol/L (ref 135–145)
TCO2: 25 mmol/L (ref 22–32)

## 2020-09-10 LAB — BASIC METABOLIC PANEL
Anion gap: 6 (ref 5–15)
BUN: 18 mg/dL (ref 8–23)
CO2: 24 mmol/L (ref 22–32)
Calcium: 8.2 mg/dL — ABNORMAL LOW (ref 8.9–10.3)
Chloride: 109 mmol/L (ref 98–111)
Creatinine, Ser: 1.18 mg/dL (ref 0.61–1.24)
GFR, Estimated: >60 mL/min (ref >60)
Glucose, Bld: 80 mg/dL (ref 70–99)
Potassium: 4.2 mmol/L (ref 3.5–5.1)
Sodium: 139 mmol/L (ref 135–145)

## 2020-09-10 LAB — PROTIME-INR
INR: 1.9 — ABNORMAL HIGH (ref 0.8–1.2)
Prothrombin Time: 21.4 seconds — ABNORMAL HIGH (ref 11.4–15.2)

## 2020-09-10 MED ORDER — DOXYCYCLINE HYCLATE 100 MG PO CAPS: 100.0000 mg | ORAL_CAPSULE | Freq: Two times a day (BID) | ORAL | 0 refills | Status: DC

## 2020-09-10 MED ORDER — LACTATED RINGERS IV BOLUS
500.0000 mL | Freq: Once | INTRAVENOUS | Status: AC
  Administered 2020-09-10: 500 mL via INTRAVENOUS

## 2020-09-10 MED ORDER — IOHEXOL 350 MG/ML SOLN
75.0000 mL | Freq: Once | INTRAVENOUS | Status: AC | PRN
  Administered 2020-09-10: 75 mL via INTRAVENOUS

## 2020-09-10 NOTE — Discharge Instructions (Addendum)
You were seen in the ER today for your chest pain and fatigue.  While the exact cause of your symptoms remains unclear, does not appear to be any emergent problem at this time.  Your physical exam, vital signs, blood work, and CT angiogram were very reassuring.  Please call your cardiothoracic surgeon or cardiologist first thing tomorrow morning for close outpatient follow-up, and return to the ER if develop any worsening chest pain, shortness of breath, palpitations, or any other new severe symptoms.  You have been prescribed a medication called doxycycline to take twice a day for the next 10 days to treat any tickborne illness you may have been exposed to after your recent bites.  Please follow-up with your primary care doctor for reevaluation of your symptoms.

## 2020-09-10 NOTE — ED Triage Notes (Signed)
Pt to ED via EMS from doctors office. Apparently pt called EMS yesterday c/o chest pain and dizziness that had been ongoing for two weeks, refused to go to ED at that time, was going to wait for MD apt today., At doctors office BP noted R arm 112 56. Left arm 69/47. EMS called from doctors office., EMS VS: R 160 palp. L. 130 palp. HR 70s, 100%RA. Denies SHOB. Hx: aortic dissection, HTN, #18 RAC, 200ML fluid bolus given by EMS,

## 2020-09-10 NOTE — ED Notes (Signed)
Pt stated he felt dizzy while siting and standing.

## 2020-09-10 NOTE — Progress Notes (Signed)
Acute Office Visit  Subjective:    Patient ID: Patrick Bell, male    DOB: [birth year], 80 y.o.   MRN: [id number]  Chief Complaint  Patient presents with   Dizziness    HPI Patient is in today for dizziness. Patrick Bell reports dizziness, extreme fatigue, and left sided chest discomfort for the last 2 weeks. The chest pain lasts for 2-3 minutes at a time. It does not radiate. It is relieved by rest. He checked his blood pressure last night in both arms and noticed a 20 point difference. He called the [facility] nurse line and was instructed to call 911. EMS came to evaluate him. They wanted him to go to the hospital to have labs drawn. He decided to wait and come to the office today to have the labs drawn. He has a history of acute thoracic aortic dissection during surgery 6 months ago that was surgically repaired. Denies nausea, vomiting, or shortness of breath.   Past Medical History:  Diagnosis Date   Acute thoracic aortic dissection (HCC) 03/11/2020   Type A   Allergy    Asthma    Cataract    Cataract    Diverticulitis    Dyspnea    GERD (gastroesophageal reflux disease)    Heart murmur 12/2019   Hyperlipidemia    Hypertension    Inguinal hernia bilateral, non-recurrent    S/P aortic dissection repair 03/11/2020   supracoronary straight graft repair with resuspension of native aortic valve and open distal anastomosis for intraoperative acute type A aortic dissection    S/P mitral valve repair 03/11/2020   Complex valvuloplasty including artificial Gore-tex neochord placement x 4, suture plication of posterior leaflet and posteromedial commissure and 32 mm Sorin Memo 4D ring annuloplasty   Sleep apnea     Past Surgical History:  Procedure Laterality Date   bilateral inguinal hernia     CARDIAC CATHETERIZATION Bilateral 02/21/2020   CHEST TUBE INSERTION N/A 03/22/2020   Procedure: CHEST TUBE INSERTION OF 28 BLAKE DRAIN.;  Surgeon: Grace Isaac, MD;  Location: [place] [facility];  Service:  Thoracic;  Laterality: N/A;   COLONOSCOPY  06/09/2004   VEH:MCNOBSJ colon diverticulosis, otherwise normal colonoscopy   EYE SURGERY  08/2014   HERNIA REPAIR     MITRAL VALVE REPAIR N/A 03/11/2020   Procedure: MITRAL VALVE REPAIR (MVR) USING MEMO 4D SIZE 32 RING;  Surgeon: Rexene Alberts, MD;  Location: [place];  Service: Open Heart Surgery;  Laterality: N/A;   PALATE / UVULA BIOPSY / EXCISION     PERICARDIAL WINDOW N/A 03/22/2020   Procedure: SUBXYPHOID POST-OP PERICARDIAL FLUID DRAINAGE WITH CHEST TUBE INSERTION.;  Surgeon: Grace Isaac, MD;  Location: [place];  Service: Thoracic;  Laterality: N/A;   REPAIR OF ACUTE ASCENDING THORACIC AORTIC DISSECTION N/A 03/11/2020   Procedure: REPAIR OF ACUTE ASCENDING THORACIC AORTIC DISSECTION USING A HEMASHIELD PLATINUM 26MM STRAIGHT GRAFT AND A HEASHIELD PLATINUM 28X10MM SINGLE ARM GRAFT;  Surgeon: Rexene Alberts, MD;  Location: [place];  Service: Open Heart Surgery;  Laterality: N/A;   RIGHT/LEFT HEART CATH AND CORONARY ANGIOGRAPHY N/A 02/21/2020   Procedure: RIGHT/LEFT HEART CATH AND CORONARY ANGIOGRAPHY;  Surgeon: Troy Sine, MD;  Location: [place] [facility];  Service: Cardiovascular;  Laterality: N/A;   TEE WITHOUT CARDIOVERSION N/A 12/19/2019   Procedure: TRANSESOPHAGEAL ECHOCARDIOGRAM (TEE);  Surgeon: Buford Dresser, MD;  Location: [facility] ENDOSCOPY;  Service: Cardiovascular;  Laterality: N/A;   TEE WITHOUT CARDIOVERSION N/A 03/11/2020   Procedure: TRANSESOPHAGEAL  ECHOCARDIOGRAM (TEE);  Surgeon: Rexene Alberts, MD;  Location: [place];  Service: Open Heart Surgery;  Laterality: N/A;   TEE WITHOUT CARDIOVERSION N/A 03/22/2020   Procedure: TRANSESOPHAGEAL ECHOCARDIOGRAM (TEE);  Surgeon: Grace Isaac, MD;  Location: [facility];  Service: Thoracic;  Laterality: N/A;   TONSILLECTOMY     VASECTOMY      Family History  Problem Relation Age of Onset   Heart disease Mother    Hip fracture Mother 47   Osteoporosis Mother    Dementia Mother  52   Arthritis Father    Heart disease Father    Arthritis Sister    Arthritis Brother    Colon cancer Neg Hx     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  Tobacco Use   Smoking status: Former    Packs/day: 4.00    Years: 25.00    Pack years: 100.00    Types: Cigarettes    Quit date: 06/23/1982    Years since quitting: 38.2   Smokeless tobacco: Never   Tobacco comments:    pt was a truck Land Use: Never used  Substance and Sexual Activity   Alcohol use: Yes    Alcohol/week: 5.0 standard drinks    Types: 5 Shots of liquor per week    Comment: brandy a few times a month, beer once a month   Drug use: No   Sexual activity: Yes    Birth control/protection: Surgical  Other Topics Concern   Not on file  Social History Narrative   Not on file   Social Determinants of Health   Financial Resource Strain: Not on file  Food Insecurity: Not on file  Transportation Needs: Not on file  Physical Activity: Not on file  Stress: Not on file  Social Connections: Not on file  Intimate Partner Violence: Not on file    Outpatient Medications Prior to Visit  Medication Sig Dispense Refill   acetaminophen (TYLENOL) 325 MG tablet Take 1-2 tablets (325-650 mg total) by mouth every 4 (four) hours as needed for mild pain.     albuterol (PROAIR HFA) 108 (90 Base) MCG/ACT inhaler Inhale 2 puffs into the lungs every 6 (six) hours as needed for wheezing. 8.5 g 2   amLODipine (NORVASC) 2.5 MG tablet Take 1 tablet (2.5 mg total) by mouth daily. 90 tablet 3   levothyroxine (SYNTHROID) 50 MCG tablet Take 1 tablet (50 mcg total) by mouth daily. 90 tablet 3   metoprolol succinate (TOPROL-XL) 50 MG 24 hr tablet Take 1 tablet (50  mg total) by mouth daily. Take with or immediately following a meal. 90 tablet 3   montelukast (SINGULAIR) 10 MG tablet Take 1 tablet (10 mg total) daily by mouth. 90 tablet 3   Olopatadine HCl 0.2 % SOLN Place 1 drop into both eyes daily as needed (allergies).     pantoprazole (PROTONIX) 40 MG tablet Take 1 tablet (40 mg total) daily by mouth. 90 tablet 3   simvastatin (ZOCOR) 10 MG tablet Take 1 tablet (10 mg total) by mouth at bedtime. 90 tablet 3   warfarin (COUMADIN) 5 MG tablet Take 1 tablet (5 mg total) by mouth  daily at 4 PM. 30 tablet 3   Multiple Vitamin (MULTIVITAMIN WITH MINERALS) TABS tablet Take 1 tablet by mouth daily. (Patient not taking: Reported on 09/10/2020)     No facility-administered medications prior to visit.    No Known Allergies  Review of Systems As per HPI.     Objective:    Physical Exam Vitals and nursing note reviewed.  Constitutional:      General: He is not in acute distress.    Appearance: He is not ill-appearing, toxic-appearing or diaphoretic.  Eyes:     Pupils: Pupils are equal, round, and reactive to light.  Cardiovascular:     Rate and Rhythm: Normal rate and regular rhythm.     Pulses:          Radial pulses are 2+ on the right side and 1+ on the left side.     Heart sounds: Normal heart sounds. No murmur heard. Pulmonary:     Effort: Pulmonary effort is normal. No respiratory distress.     Breath sounds: Normal breath sounds.  Abdominal:     General: There is no distension.     Palpations: Abdomen is soft.     Tenderness: There is no abdominal tenderness. There is no guarding or rebound.  Musculoskeletal:     Right lower leg: No edema.     Left lower leg: No edema.  Skin:    General: Skin is warm and dry.  Neurological:     General: No focal deficit present.     Mental Status: He is alert and oriented to person, place, and time.  Psychiatric:        Mood and Affect: Mood normal.        Behavior: Behavior normal.    BP (!) 69/47  (BP Location: Left Arm)   Pulse 85   Ht 5' 9.25" (1.759 m)   Wt 179 lb 2 oz (81.3 kg)   BMI 26.26 kg/m  Wt Readings from Last 3 Encounters:  09/10/20 179 lb 2 oz (81.3 kg)  08/07/20 181 lb (82.1 kg)  07/14/20 182 lb 3.2 oz (82.6 kg)    Health Maintenance Due  Topic Date Due   Pneumococcal Vaccine 85-2 Years old (1 - PCV) Never done   Zoster Vaccines- Shingrix (1 of 2) Never done    There are no preventive care reminders to display for this patient.   Lab Results  Component Value Date   TSH 2.930 07/06/2020   Lab Results  Component Value Date   WBC 6.0 07/06/2020   HGB 13.2 07/06/2020   HCT 42.3 07/06/2020   MCV 83 07/06/2020   PLT 275 07/06/2020   Lab Results  Component Value Date   NA 142 07/06/2020   K 4.3 07/06/2020   CO2 22 07/06/2020   GLUCOSE 77 07/06/2020   BUN 19 07/06/2020   CREATININE 1.19 07/06/2020   BILITOT 0.3 07/06/2020   ALKPHOS 81 07/06/2020   AST 16 07/06/2020   ALT 6 07/06/2020   PROT 6.8 07/06/2020   ALBUMIN 4.3 07/06/2020   CALCIUM 9.3 07/06/2020   ANIONGAP 8 04/06/2020   EGFR 62 07/06/2020   Lab Results  Component Value Date   CHOL 184 11/14/2019   Lab Results  Component Value Date   HDL 70 11/14/2019   Lab Results  Component Value Date   LDLCALC 98 11/14/2019   Lab Results  Component Value Date   TRIG 87 11/14/2019   Lab Results  Component Value Date  CHOLHDL 2.6 11/14/2019   Lab Results  Component Value Date   HGBA1C 5.4 03/09/2020       Assessment & Plan:   Patrick Bell was seen today for dizziness.  Diagnoses and all orders for this visit:  Chest pain, unspecified type History of aortic dissection  Patient presents with left sided intermittent chest pain with extreme fatigue and dizziness x 2 weeks. Patient noted 20 point difference in blood pressures in his arms last night. Was evaluated by EMS last night but declined transport to hospital. In office today his right arm BP is 112/56 and left arm BP is 69/47.  Discussed concerns with patient regarding difference in BP between arms and the need to be evaluated in ER emergently. Patient agreed. EMS called. Care of patient transferred to EMS at their arrival.     Gwenlyn Perking, FNP

## 2020-09-10 NOTE — ED Provider Notes (Signed)
[facility] EMERGENCY DEPARTMENT Provider Note   CSN: [id number] Arrival date & time: 09/10/20  1130     History Chief Complaint  Patient presents with   Dizziness   Chest Pain    Patrick Bell is a 80 y.o. male who presents with concern for 2 weeks of dizziness, lightheadedness and left-sided chest pain.  Initially was directed to call EMS by the RN and his health insurance, however refused to come to the emergency department after being evaluated by them.  Did go to his primary care doctor this morning who directed him to the emergency department for significant differential between BP and right and left arms.  EMS was called with SBP in the right arm of 160 and SBP in the left arm 130.  Heart rate is normal, patient is not hypotensive.  Of note patient does have history of aortic dissection which occurred while he was undergoing procedure to repair mitral valve.  He is anticoagulated with Coumadin.  Left-sided chest pain does not radiate into the arms or the neck, has no associated nausea or vomiting or diaphoresis.  CTA 06/12/2020 with the following findings: IMPRESSION: 1. Interval tube graft repair of the ascending aorta. Dilated native segment up to 4.3 cm. 2. Persistent aortic dissection flap through the arch, descending thoracic, abdominal aorta, bilateral iliac arteries and left common femoral artery. 3. Dissection flap involves bilateral subclavian arteries, celiac, SMA, and bilateral renal arteries.  HPI     Past Medical History:  Diagnosis Date   Acute thoracic aortic dissection (HCC) 03/11/2020   Type A   Allergy    Asthma    Cataract    Cataract    Diverticulitis    Dyspnea    GERD (gastroesophageal reflux disease)    Heart murmur 12/2019   Hyperlipidemia    Hypertension    Inguinal hernia bilateral, non-recurrent    S/P aortic dissection repair 03/11/2020   supracoronary straight graft repair with resuspension of native aortic valve and  open distal anastomosis for intraoperative acute type A aortic dissection    S/P mitral valve repair 03/11/2020   Complex valvuloplasty including artificial Gore-tex neochord placement x 4, suture plication of posterior leaflet and posteromedial commissure and 32 mm Sorin Memo 4D ring annuloplasty   Sleep apnea     Patient Active Problem List   Diagnosis Date Noted   Debility 06/02/2020   Orthostasis 06/02/2020   Anticoagulated on Coumadin 04/27/2020   Pericardial effusion 04/27/2020   PAF (paroxysmal atrial fibrillation) ([place]) 04/27/2020   Anemia 04/21/2020   Peripheral edema    Hypoalbuminemia due to protein-calorie malnutrition (HCC)    AKI (acute kidney injury) ([place])    Hypothyroidism    Acute blood loss anemia    Muscle pain    Fatigue 03/31/2020   S/P mitral valve repair 03/11/2020   S/P aortic dissection repair 03/11/2020   Acute thoracic aortic dissection ([place]) 03/11/2020   Severe mitral regurgitation by prior echocardiogram    Choking 02/14/2018   Deviated septum 02/14/2018   Nasal turbinate hypertrophy 02/14/2018   BPH (benign prostatic hyperplasia) 07/29/2014   Low serum vitamin D 03/14/2014   Hyperlipidemia    GERD (gastroesophageal reflux disease) 08/03/2012   Essential hypertension, benign 08/19/2008   DIVERTICULAR DISEASE 08/18/2008   BACK PAIN, CHRONIC 08/18/2008   OSA (obstructive sleep apnea) 08/18/2008   PREMATURE VENTRICULAR CONTRACTIONS 07/23/2008   Asthma 07/23/2008    Past Surgical History:  Procedure Laterality Date   bilateral inguinal hernia  CARDIAC CATHETERIZATION Bilateral 02/21/2020   CHEST TUBE INSERTION N/A 03/22/2020   Procedure: CHEST TUBE INSERTION OF 28 BLAKE DRAIN.;  Surgeon: Grace Isaac, MD;  Location: [facility];  Service: Thoracic;  Laterality: N/A;   COLONOSCOPY  06/09/2004   VQQ:[id number] colon diverticulosis, otherwise normal colonoscopy   EYE SURGERY  08/2014   HERNIA REPAIR     MITRAL VALVE REPAIR N/A 03/11/2020    Procedure: MITRAL VALVE REPAIR (MVR) USING MEMO 4D SIZE 32 RING;  Surgeon: Rexene Alberts, MD;  Location: [facility];  Service: Open Heart Surgery;  Laterality: N/A;   PALATE / UVULA BIOPSY / EXCISION     PERICARDIAL WINDOW N/A 03/22/2020   Procedure: SUBXYPHOID POST-OP PERICARDIAL FLUID DRAINAGE WITH CHEST TUBE INSERTION.;  Surgeon: Grace Isaac, MD;  Location: [place] [facility];  Service: Thoracic;  Laterality: N/A;   REPAIR OF ACUTE ASCENDING THORACIC AORTIC DISSECTION N/A 03/11/2020   Procedure: REPAIR OF ACUTE ASCENDING THORACIC AORTIC DISSECTION USING A HEMASHIELD PLATINUM 26MM STRAIGHT GRAFT AND A HEASHIELD PLATINUM 28X10MM SINGLE ARM GRAFT;  Surgeon: Rexene Alberts, MD;  Location: [facility];  Service: Open Heart Surgery;  Laterality: N/A;   RIGHT/LEFT HEART CATH AND CORONARY ANGIOGRAPHY N/A 02/21/2020   Procedure: RIGHT/LEFT HEART CATH AND CORONARY ANGIOGRAPHY;  Surgeon: Troy Sine, MD;  Location: [facility];  Service: Cardiovascular;  Laterality: N/A;   TEE WITHOUT CARDIOVERSION N/A 12/19/2019   Procedure: TRANSESOPHAGEAL ECHOCARDIOGRAM (TEE);  Surgeon: Buford Dresser, MD;  Location: [facility] ENDOSCOPY;  Service: Cardiovascular;  Laterality: N/A;   TEE WITHOUT CARDIOVERSION N/A 03/11/2020   Procedure: TRANSESOPHAGEAL ECHOCARDIOGRAM (TEE);  Surgeon: Rexene Alberts, MD;  Location: [facility];  Service: Open Heart Surgery;  Laterality: N/A;   TEE WITHOUT CARDIOVERSION N/A 03/22/2020   Procedure: TRANSESOPHAGEAL ECHOCARDIOGRAM (TEE);  Surgeon: Grace Isaac, MD;  Location: [facility] OR;  Service: Thoracic;  Laterality: N/A;   TONSILLECTOMY     VASECTOMY         Family History  Problem Relation Age of Onset   Heart disease Mother    Hip fracture Mother 76   Osteoporosis Mother    Dementia Mother 104   Arthritis Father    Heart disease Father    Arthritis Sister    Arthritis Brother    Colon cancer Neg Hx     Social History   Tobacco Use   Smoking status: Former    Packs/day: 4.00     Years: 25.00    Pack years: 100.00    Types: Cigarettes    Quit date: 06/23/1982    Years since quitting: 38.2   Smokeless tobacco: Never   Tobacco comments:    pt was a truck Land Use: Never used  Substance Use Topics   Alcohol use: Yes    Alcohol/week: 5.0 standard drinks    Types: 5 Shots of liquor per week    Comment: brandy a few times a month, beer once a month   Drug use: No    Home Medications Prior to Admission medications   Medication Sig Start Date End Date Taking? Authorizing Provider  acetaminophen (TYLENOL) 325 MG tablet Take 1-2 tablets (325-650 mg total) by mouth every 4 (four) hours as needed for mild pain. 04/06/20  Yes Angiulli, Lavon Paganini, PA-C  albuterol (PROAIR HFA) 108 (90 Base) MCG/ACT inhaler Inhale 2 puffs into the lungs every 6 (six) hours as needed for wheezing. 04/06/20  Yes Angiulli, Lavon Paganini, PA-C  amLODipine (  NORVASC) 2.5 MG tablet Take 1 tablet (2.5 mg total) by mouth daily. 07/14/20 07/09/21 Yes Troy Sine, MD  doxycycline (VIBRAMYCIN) 100 MG capsule Take 1 capsule (100 mg total) by mouth 2 (two) times daily for 10 days. 09/10/20 09/20/20 Yes Johnetta Sloniker, Gypsy Balsam, PA-C  levothyroxine (SYNTHROID) 50 MCG tablet Take 1 tablet (50 mcg total) by mouth daily. 05/08/20  Yes Dettinger, Fransisca Kaufmann, MD  metoprolol succinate (TOPROL-XL) 50 MG 24 hr tablet Take 1 tablet (50 mg total) by mouth daily. Take with or immediately following a meal. 07/14/20 10/12/20 Yes Troy Sine, MD  montelukast (SINGULAIR) 10 MG tablet Take 1 tablet (10 mg total) daily by mouth. 04/06/20  Yes Angiulli, Lavon Paganini, PA-C  Multiple Vitamin (MULTIVITAMIN WITH MINERALS) TABS tablet Take 1 tablet by mouth daily. 04/06/20  Yes Angiulli, Lavon Paganini, PA-C  Olopatadine HCl 0.2 % SOLN Place 1 drop into both eyes daily as needed (allergies).   Yes [provider]  pantoprazole (PROTONIX) 40 MG tablet Take 1 tablet (40 mg total) daily by mouth. 04/06/20  Yes Angiulli, Lavon Paganini,  PA-C  simvastatin (ZOCOR) 10 MG tablet Take 1 tablet (10 mg total) by mouth at bedtime. 04/06/20  Yes Angiulli, Lavon Paganini, PA-C  warfarin (COUMADIN) 5 MG tablet Take 1 tablet (5 mg total) by mouth daily at 4 PM. 05/08/20  Yes Dettinger, Fransisca Kaufmann, MD    Allergies    Patient has no known allergies.  Review of Systems   Review of Systems  Constitutional:  Positive for activity change and fatigue. Negative for appetite change, chills, diaphoresis and fever.  HENT: Negative.    Respiratory:  Positive for chest tightness. Negative for cough and shortness of breath.   Cardiovascular:  Positive for chest pain and palpitations. Negative for leg swelling.  Gastrointestinal: Negative.   Genitourinary: Negative.   Musculoskeletal: Negative.   Skin: Negative.   Neurological:  Positive for dizziness, weakness and light-headedness. Negative for tremors, syncope and headaches.   Physical Exam Updated Vital Signs BP (!) 151/75   Pulse 79   Temp 98.2 F (36.8 C) (Oral)   Resp 13   Ht 5\' 10"  (1.778 m)   Wt 81.2 kg   SpO2 99%   BMI 25.68 kg/m   Physical Exam Vitals and nursing note reviewed.  Constitutional:      Appearance: He is not ill-appearing or toxic-appearing.  HENT:     Head: Normocephalic and atraumatic.     Nose: Nose normal.     Mouth/Throat:     Mouth: Mucous membranes are moist.     Pharynx: Oropharynx is clear. Uvula midline. No oropharyngeal exudate, posterior oropharyngeal erythema or uvula swelling.     Tonsils: No tonsillar exudate.  Eyes:     General: Lids are normal. Vision grossly intact.        Right eye: No discharge.        Left eye: No discharge.     Extraocular Movements: Extraocular movements intact.     Conjunctiva/sclera: Conjunctivae normal.     Pupils: Pupils are equal, round, and reactive to light.  Neck:     Trachea: Trachea and phonation normal.  Cardiovascular:     Rate and Rhythm: Normal rate and regular rhythm.     Pulses: Normal pulses.     Heart  sounds: No murmur heard.   Gallop present. S3 sounds present.  Pulmonary:     Effort: Pulmonary effort is normal. No tachypnea, bradypnea, accessory muscle usage, prolonged expiration  or respiratory distress.     Breath sounds: Normal breath sounds. No wheezing or rales.  Abdominal:     General: Bowel sounds are normal. There is no distension.     Palpations: Abdomen is soft.     Tenderness: There is no abdominal tenderness. There is no right CVA tenderness, left CVA tenderness, guarding or rebound.  Musculoskeletal:        General: No deformity.     Cervical back: Normal range of motion and neck supple. No edema, rigidity or crepitus. No pain with movement, spinous process tenderness or muscular tenderness.     Right lower leg: No edema.     Left lower leg: No edema.  Lymphadenopathy:     Cervical: No cervical adenopathy.  Skin:    General: Skin is warm and dry.  Neurological:     Mental Status: He is alert. Mental status is at baseline.  Psychiatric:        Mood and Affect: Mood normal.    ED Results / Procedures / Treatments   Labs (all labs ordered are listed, but only abnormal results are displayed) Labs Reviewed  CBC WITH DIFFERENTIAL/PLATELET - Abnormal; Notable for the following components:      Result Value   WBC 3.0 (*)    Hemoglobin 12.9 (*)    RDW 15.9 (*)    All other components within normal limits  BASIC METABOLIC PANEL - Abnormal; Notable for the following components:   Calcium 8.2 (*)    All other components within normal limits  BRAIN NATRIURETIC PEPTIDE - Abnormal; Notable for the following components:   B Natriuretic Peptide 112.7 (*)    All other components within normal limits  PROTIME-INR - Abnormal; Notable for the following components:   Prothrombin Time 21.4 (*)    INR 1.9 (*)    All other components within normal limits  I-STAT CHEM 8, ED - Abnormal; Notable for the following components:   Calcium, Ion 1.10 (*)    All other components within  normal limits  RESP PANEL BY RT-PCR (FLU A&B, COVID) [id number]  TYPE AND SCREEN  TROPONIN I (HIGH SENSITIVITY)  TROPONIN I (HIGH SENSITIVITY)    EKG EKG Interpretation  Date/Time:  Thursday September 10 2020 11:38:40 EDT Ventricular Rate:  80 PR Interval:  205 QRS Duration: 113 QT Interval:  408 QTC Calculation: 471 R Axis:   -28 Text Interpretation: Sinus rhythm Borderline intraventricular conduction delay Confirmed by Dene Gentry [phone]) on 09/10/2020 1:56:02 PM  Radiology CT Angio Chest/Abd/Pel for Dissection W and/or W/WO  Result Date: 09/10/2020 CLINICAL DATA:  Chest and back pain. EXAM: CT ANGIOGRAPHY CHEST, ABDOMEN AND PELVIS TECHNIQUE: Non-contrast CT of the chest was initially obtained. Multidetector CT imaging through the chest, abdomen and pelvis was performed using the standard protocol during bolus administration of intravenous contrast. Multiplanar reconstructed images and MIPs were obtained and reviewed to evaluate the vascular anatomy. CONTRAST:  52mL OMNIPAQUE IOHEXOL 350 MG/ML SOLN COMPARISON:  June 12, 2020. FINDINGS: CTA CHEST FINDINGS Cardiovascular: Status post surgical repair of ascending thoracic aorta. Status post mitral valve repair. Stable appearance of residual dissection is seen that begins at the origin of the innominate artery and extends through the transverse aortic arch and descending thoracic aorta into the abdominal aorta. Stable dissection flap is seen extending into the left subclavian artery. Normal cardiac size. No pericardial effusion. Mediastinum/Nodes: No enlarged mediastinal, hilar, or axillary lymph nodes. Thyroid gland, trachea, and esophagus demonstrate no significant findings. Lungs/Pleura: No pneumothorax or  pleural effusion is noted. 6 mm nodule is noted inferiorly in right middle lobe best seen on image number 114 of series 7. No consolidative process is noted. Musculoskeletal: No chest wall abnormality. No acute or significant osseous findings.  Review of the MIP images confirms the above findings. CTA ABDOMEN AND PELVIS FINDINGS VASCULAR Aorta: Thoracic aortic dissection noted on prior exam extends throughout the abdominal aorta and into the common and external iliac arteries bilaterally no definite aneurysm is noted. Celiac: Patent without evidence of aneurysm, dissection, vasculitis or significant stenosis. SMA: Stable dissection is seen involving the proximal portion of the superior mesenteric artery which is unchanged compared to prior exam. Renals: Stable dissection is seen extending through the left renal artery. Stable dissection is seen involving the proximal portion of the right renal artery. IMA: Patent, but arises from the false lumen. Inflow: As noted above, dissection is seen to extend through the bilateral common and external iliac arteries and into left common femoral artery. This is stable compared to prior exam. Veins: No obvious venous abnormality within the limitations of this arterial phase study. Review of the MIP images confirms the above findings. NON-VASCULAR Hepatobiliary: No focal liver abnormality is seen. No gallstones, gallbladder wall thickening, or biliary dilatation. Pancreas: Unremarkable. No pancreatic ductal dilatation or surrounding inflammatory changes. Spleen: Normal in size without focal abnormality. Adrenals/Urinary Tract: Adrenal glands are unremarkable. Kidneys are normal, without renal calculi, focal lesion, or hydronephrosis. Bladder is unremarkable. Stomach/Bowel: Stomach is within normal limits. Appendix appears normal. No evidence of bowel wall thickening, distention, or inflammatory changes. Diverticulosis of descending and sigmoid colon is noted without inflammation. Lymphatic: No adenopathy is noted. Reproductive: Prostate is unremarkable. Other: No abdominal wall hernia or abnormality. No abdominopelvic ascites. Musculoskeletal: No acute or significant osseous findings. Review of the MIP images confirms  the above findings. IMPRESSION: Status post surgical repair of ascending thoracic aorta as well as mitral valve repair. Grossly stable appearance thoracic aortic dissection that includes the transverse aortic arch and descending thoracic aorta. It extends through the abdominal aorta and into bilateral common and external iliac arteries and left common femoral artery. Dissection flap is seen involving the left subclavian artery, superior mesenteric artery and both renal arteries. 6 mm nodule is noted in right middle lobe. Non-contrast chest CT at 6-12 months is recommended. If the nodule is stable at time of repeat CT, then future CT at 18-24 months (from today's scan) is considered optional for low-risk patients, but is recommended for high-risk patients. This recommendation follows the consensus statement: Guidelines for Management of Incidental Pulmonary Nodules Detected on CT Images: From the Fleischner Society 2017; Radiology 2017; 284:228-243. Diverticulosis of descending and sigmoid colon without inflammation. Electronically Signed   By: Marijo Conception M.D.   On: 09/10/2020 14:10    Procedures Procedures   Medications Ordered in ED Medications  iohexol (OMNIPAQUE) 350 MG/ML injection 75 mL (75 mLs Intravenous Contrast Given 09/10/20 1316)  lactated ringers bolus 500 mL (0 mLs Intravenous Stopped 09/10/20 1648)    ED Course  I have reviewed the triage vital signs and the nursing notes.  Pertinent labs & imaging results that were available during my care of the patient were reviewed by me and considered in my medical decision making (see chart for details).  Clinical Course as of 09/10/20 1817  Thu Sep 10, 2020  1647 Discussed utilization of doxycycline in context of patient on Coumadin with ED pharmacist; recommendation was that it is safe to proceed with Doxy,  as long as patient has close outpatient follow-up for INR.  Patient will call outpatient provider first thing in the morning to schedule  follow-up INR check. [RS]    Clinical Course User Index [RS] Dreama Kuna, Sharlene Dory   MDM Rules/Calculators/A&P                         80 year old male with history of dissection of the entire aorta extending into the iliac arteries who presents with concern for intermittent episodes of dizziness that is worse in the last 3 days now with associated left-sided chest pressure that does not radiate into the arms.  Differential diagnosis is significantly concerning for progression of his dissection or other vascular dissection given his history, valvular disease, ACS, pleural effusion, PE, dysrhythmia, COVID-19.  Hypertensive on intake, vital signs otherwise normal.  Cardiopulmonary exam is significant for S3 gallop and mild systolic murmur, pulmonary exam is otherwise normal.  Abdominal exam is benign.  No pulsatile masses palpable on exam.  Patient is neurovascular intact in all 4 extremities.  There is some differential between the blood pressures obtained in his right and left upper extremities, however this is to be expected given his history of aortic dissection involving the left subclavian.  He is neurovascular intact in the left upper extremity as well.  Patient is neurovascularly intact without deficit on exam.  CBC with mild neutropenia of 3, anemia very mild with hemoglobin of 12.9.  BMP unremarkable, INR 1.9, expected on Coumadin therapy.  BNP mildly elevated to 112.  I-STAT Chem-8 unremarkable.  Troponin is negative, 9.  Delta troponin also 9.  EKG without new ischemic changes.  CT angiogram with stable vascular dissections consistent with prior studies, no new progression.  There is a 6 mm nodule in the right middle lobe which should undergo monitoring with Noncon CTs in the future.  There is no clear cause for the patient's left-sided chest discomfort or episodes of lightheadedness over the last week based on today's work-up, however it is very reassuring.  No concern for progression  of his dissections, no dysrhythmia noted on EKG, and no evidence of ACS with normal troponins.  Additionally no pleural effusion or PE identified on CT angiogram.  Given his impressive cardiac and vascular history, observation admission was offered to this patient, ever he declined and adamantly stated he is ready to be discharged home.  Patient is well-appearing and has had reassuring vital signs throughout his stay in the emergency department.  As patient is adamant he be discharged home, recommend strict close follow-up with his cardiothoracic surgeon and cardiologist.  Additionally patient and his wife did inform this provider that he has had multiple recent tick bites as he walks in the woods with regularity.  No report of rash, however concern for 3 ticks present for an unknown amount of time.  Will cover with doxycycline.  Discussed with pharmacist as above, safe for patient to take Doxy on Coumadin, however recommend close follow-up with PCP or Coumadin clinic for INR recheck early next week.  The patient and his wife voiced understanding of this instruction.  No further work-up warranted in the ED as time.  [place] voiced understanding of his medical evaluation and treatment plan.  Each of his questions was answered to his expressed satisfaction.  Strict return precautions are given.  Patient is well-appearing, stable, and appropriate for discharge.  This chart was dictated using voice recognition software, Dragon. Despite the best efforts of  this provider to proofread and correct errors, errors may still occur which can change documentation meaning.  Final Clinical Impression(s) / ED Diagnoses Final diagnoses:  Chest pain, unspecified type    Rx / DC Orders ED Discharge Orders          Ordered    doxycycline (VIBRAMYCIN) 100 MG capsule  2 times daily        09/10/20 1647             Jaquay Morneault, Gypsy Balsam, PA-C 09/10/20 1817    Valarie Merino, MD 09/13/20 1118

## 2020-09-11 ENCOUNTER — Telehealth: Payer: Self-pay | Admitting: Family Medicine

## 2020-09-14 ENCOUNTER — Other Ambulatory Visit: Payer: Medicare Other

## 2020-09-14 ENCOUNTER — Inpatient Hospital Stay: Admission: RE | Admit: 2020-09-14 | Payer: Medicare Other | Source: Ambulatory Visit

## 2020-09-14 ENCOUNTER — Other Ambulatory Visit: Payer: Self-pay | Admitting: *Deleted

## 2020-09-14 DIAGNOSIS — Z7689 Persons encountering health services in other specified circumstances: Secondary | ICD-10-CM | POA: Diagnosis not present

## 2020-09-14 DIAGNOSIS — W57XXXD Bitten or stung by nonvenomous insect and other nonvenomous arthropods, subsequent encounter: Secondary | ICD-10-CM | POA: Diagnosis not present

## 2020-09-14 NOTE — Telephone Encounter (Signed)
Lab orders placed  Pt was in ER and they started him on DOXY for tick bites - but would rather not have to take the med unless really needed - wants tick titers drawn.

## 2020-09-15 ENCOUNTER — Ambulatory Visit (INDEPENDENT_AMBULATORY_CARE_PROVIDER_SITE_OTHER): Payer: Medicare Other | Admitting: Nurse Practitioner

## 2020-09-15 ENCOUNTER — Encounter: Payer: Self-pay | Admitting: Nurse Practitioner

## 2020-09-15 ENCOUNTER — Other Ambulatory Visit: Payer: Self-pay

## 2020-09-15 VITALS — BP 137/68 | HR 75 | Ht 70.0 in | Wt 179.0 lb

## 2020-09-15 DIAGNOSIS — R6889 Other general symptoms and signs: Secondary | ICD-10-CM | POA: Diagnosis not present

## 2020-09-15 DIAGNOSIS — R5383 Other fatigue: Secondary | ICD-10-CM

## 2020-09-15 LAB — COMPREHENSIVE METABOLIC PANEL
ALT: 7 IU/L (ref 0–44)
AST: 20 IU/L (ref 0–40)
Albumin/Globulin Ratio: 1.9 (ref 1.2–2.2)
Albumin: 3.9 g/dL (ref 3.6–4.6)
Alkaline Phosphatase: 63 IU/L (ref 44–121)
BUN/Creatinine Ratio: 14 (ref 10–24)
BUN: 14 mg/dL (ref 8–27)
Bilirubin Total: 0.4 mg/dL (ref 0.0–1.2)
CO2: 22 mmol/L (ref 20–29)
Calcium: 9 mg/dL (ref 8.6–10.2)
Chloride: 106 mmol/L (ref 96–106)
Creatinine, Ser: 1 mg/dL (ref 0.76–1.27)
Globulin, Total: 2.1 g/dL (ref 1.5–4.5)
Glucose: 84 mg/dL (ref 65–99)
Potassium: 4.3 mmol/L (ref 3.5–5.2)
Sodium: 142 mmol/L (ref 134–144)
Total Protein: 6 g/dL (ref 6.0–8.5)
eGFR: 76 mL/min/{1.73_m2} (ref >59)

## 2020-09-15 NOTE — Progress Notes (Signed)
 Acute Office Visit  Subjective:    Patient ID: Patrick Bell, male    DOB: 07/31/1940, 80 y.o.   MRN: [id number]  Chief Complaint  Patient presents with   Fatigue    Tired everyday. Wants to sleep a lot. Not improving since last visit    HPI Patient is in today for Fatigue  He reports recurrent fatigue which he describes as a lack of energy, feeling exhausted, feeling sleepy, and feeling weak. It began a few weeks ago and occurs every day. It is described as moderate and staying constant. He has not started new medications around the time the fatigue started.   Associated symptoms: No arthralgias No bleeding  No melena No chest discomfort  No heart palpitations No heart racing   No dyspnea No feeling depressed  No feeling anxious or under stress No fevers  No loss of appetite No nausea  No vomiting No sleeping problems    Wt Readings from Last 3 Encounters:  09/15/20 179 lb (81.2 kg)  09/10/20 179 lb (81.2 kg)  09/10/20 179 lb 2 oz (81.3 kg)    Lab Results  Component Value Date   WBC 3.0 (L) 09/10/2020   HGB 13.3 09/10/2020   HCT 39.0 09/10/2020   MCV 86.7 09/10/2020   PLT 171 09/10/2020   Lab Results  Component Value Date   TSH 2.930 07/06/2020   Lab Results  Component Value Date   NA 141 09/10/2020   K 4.1 09/10/2020   CO2 24 09/10/2020   BUN 22 09/10/2020   CREATININE 1.20 09/10/2020   CALCIUM 8.2 (L) 09/10/2020   GLUCOSE 78 09/10/2020     ---------------------------------------------------------------------------------------------------   Past Medical History:  Diagnosis Date   Acute thoracic aortic dissection (HCC) 03/11/2020   Type A   Allergy    Asthma    Cataract    Cataract    Diverticulitis    Dyspnea    GERD (gastroesophageal reflux disease)    Heart murmur 12/2019   Hyperlipidemia    Hypertension    Inguinal hernia bilateral, non-recurrent    S/P aortic dissection repair 03/11/2020   supracoronary straight graft repair with  resuspension of native aortic valve and open distal anastomosis for intraoperative acute type A aortic dissection    S/P mitral valve repair 03/11/2020   Complex valvuloplasty including artificial Gore-tex neochord placement x 4, suture plication of posterior leaflet and posteromedial commissure and 32 mm Sorin Memo 4D ring annuloplasty   Sleep apnea     Past Surgical History:  Procedure Laterality Date   bilateral inguinal hernia     CARDIAC CATHETERIZATION Bilateral 02/21/2020   CHEST TUBE INSERTION N/A 03/22/2020   Procedure: CHEST TUBE INSERTION OF 28 BLAKE DRAIN.;  Surgeon: Gerhardt, Edward B, MD;  Location: [facility] OR;  Service: Thoracic;  Laterality: N/A;   COLONOSCOPY  06/09/2004   NUR:Sigmoid colon diverticulosis, otherwise normal colonoscopy   EYE SURGERY  08/2014   HERNIA REPAIR     MITRAL VALVE REPAIR N/A 03/11/2020   Procedure: MITRAL VALVE REPAIR (MVR) USING MEMO 4D SIZE 32 RING;  Surgeon: Owen, Clarence H, MD;  Location: [facility] OR;  Service: Open Heart Surgery;  Laterality: N/A;   PALATE / UVULA BIOPSY / EXCISION     PERICARDIAL WINDOW N/A 03/22/2020   Procedure: SUBXYPHOID POST-OP PERICARDIAL FLUID DRAINAGE WITH CHEST TUBE INSERTION.;  Surgeon: Gerhardt, Edward B, MD;  Location: [facility] OR;  Service: Thoracic;  Laterality: N/A;   REPAIR OF ACUTE ASCENDING THORACIC AORTIC   DISSECTION N/A 03/11/2020   Procedure: REPAIR OF ACUTE ASCENDING THORACIC AORTIC DISSECTION USING A HEMASHIELD PLATINUM 26MM STRAIGHT GRAFT AND A HEASHIELD PLATINUM 28X10MM SINGLE ARM GRAFT;  Surgeon: Owen, Clarence H, MD;  Location: [facility] OR;  Service: Open Heart Surgery;  Laterality: N/A;   RIGHT/LEFT HEART CATH AND CORONARY ANGIOGRAPHY N/A 02/21/2020   Procedure: RIGHT/LEFT HEART CATH AND CORONARY ANGIOGRAPHY;  Surgeon: Kelly, Thomas A, MD;  Location: [facility];  Service: Cardiovascular;  Laterality: N/A;   TEE WITHOUT CARDIOVERSION N/A 12/19/2019   Procedure: TRANSESOPHAGEAL ECHOCARDIOGRAM (TEE);  Surgeon:  Christopher, Bridgette, MD;  Location: [facility];  Service: Cardiovascular;  Laterality: N/A;   TEE WITHOUT CARDIOVERSION N/A 03/11/2020   Procedure: TRANSESOPHAGEAL ECHOCARDIOGRAM (TEE);  Surgeon: Owen, Clarence H, MD;  Location: [facility];  Service: Open Heart Surgery;  Laterality: N/A;   TEE WITHOUT CARDIOVERSION N/A 03/22/2020   Procedure: TRANSESOPHAGEAL ECHOCARDIOGRAM (TEE);  Surgeon: Gerhardt, Edward B, MD;  Location: [facility];  Service: Thoracic;  Laterality: N/A;   TONSILLECTOMY     VASECTOMY      Family History  Problem Relation Age of Onset   Heart disease Mother    Hip fracture Mother 94   Osteoporosis Mother    Dementia Mother 90   Arthritis Father    Heart disease Father    Arthritis Sister    Arthritis Brother    Colon cancer Neg Hx     Social History   Socioeconomic History   Marital status: Married    Spouse name: Rebecca   Number of children: 2   Years of education: 14   Highest education level: Some college, no degree  Occupational History   Occupation: retired    Employer: [employer]    Comment: truck driving  Tobacco Use   Smoking status: Former    Packs/day: 4.00    Years: 25.00    Pack years: 100.00    Types: Cigarettes    Quit date: 06/23/1982    Years since quitting: 38.2   Smokeless tobacco: Never   Tobacco comments:    pt was a truck driver  Vaping Use   Vaping Use: Never used  Substance and Sexual Activity   Alcohol use: Yes    Alcohol/week: 5.0 standard drinks    Types: 5 Shots of liquor per week    Comment: brandy a few times a month, beer once a month   Drug use: No   Sexual activity: Yes    Birth control/protection: Surgical  Other Topics Concern   Not on file  Social History Narrative   Not on file   Social Determinants of Health   Financial Resource Strain: Not on file  Food Insecurity: Not on file  Transportation Needs: Not on file  Physical Activity: Not on file  Stress: Not on file  Social Connections: Not on file   Intimate Partner Violence: Not on file    Outpatient Medications Prior to Visit  Medication Sig Dispense Refill   acetaminophen (TYLENOL) 325 MG tablet Take 1-2 tablets (325-650 mg total) by mouth every 4 (four) hours as needed for mild pain.     albuterol (PROAIR HFA) 108 (90 Base) MCG/ACT inhaler Inhale 2 puffs into the lungs every 6 (six) hours as needed for wheezing. 8.5 g 2   amLODipine (NORVASC) 2.5 MG tablet Take 1 tablet (2.5 mg total) by mouth daily. 90 tablet 3   levothyroxine (SYNTHROID) 50 MCG tablet Take 1 tablet (50 mcg total) by mouth daily. 90 tablet   3   metoprolol succinate (TOPROL-XL) 50 MG 24 hr tablet Take 1 tablet (50 mg total) by mouth daily. Take with or immediately following a meal. 90 tablet 3   montelukast (SINGULAIR) 10 MG tablet Take 1 tablet (10 mg total) daily by mouth. 90 tablet 3   Multiple Vitamin (MULTIVITAMIN WITH MINERALS) TABS tablet Take 1 tablet by mouth daily.     Olopatadine HCl 0.2 % SOLN Place 1 drop into both eyes daily as needed (allergies).     pantoprazole (PROTONIX) 40 MG tablet Take 1 tablet (40 mg total) daily by mouth. 90 tablet 3   simvastatin (ZOCOR) 10 MG tablet Take 1 tablet (10 mg total) by mouth at bedtime. 90 tablet 3   warfarin (COUMADIN) 5 MG tablet Take 1 tablet (5 mg total) by mouth daily at 4 PM. 30 tablet 3   doxycycline (VIBRAMYCIN) 100 MG capsule Take 1 capsule (100 mg total) by mouth 2 (two) times daily for 10 days. 20 capsule 0   No facility-administered medications prior to visit.    No Known Allergies  Review of Systems  Constitutional: Negative.   HENT: Negative.    Eyes: Negative.   Respiratory: Negative.    Gastrointestinal: Negative.   Skin:  Negative for rash.  All other systems reviewed and are negative.     Objective:    Physical Exam Vitals reviewed.  Constitutional:      Appearance: Normal appearance.  HENT:     Head: Normocephalic.     Nose: Nose normal.  Eyes:     Conjunctiva/sclera:  Conjunctivae normal.  Cardiovascular:     Rate and Rhythm: Normal rate and regular rhythm.  Pulmonary:     Breath sounds: Normal breath sounds.  Abdominal:     General: Bowel sounds are normal.  Musculoskeletal:        General: Normal range of motion.     Cervical back: Normal range of motion.  Skin:    Findings: No rash.  Neurological:     Mental Status: He is alert and oriented to person, place, and time.     Motor: Weakness present.  Psychiatric:        Behavior: Behavior normal.    BP 137/68   Pulse 75   Ht 5' 10" (1.778 m)   Wt 179 lb (81.2 kg)   SpO2 99%   BMI 25.68 kg/m  Wt Readings from Last 3 Encounters:  09/15/20 179 lb (81.2 kg)  09/10/20 179 lb (81.2 kg)  09/10/20 179 lb 2 oz (81.3 kg)    Health Maintenance Due  Topic Date Due   Zoster Vaccines- Shingrix (1 of 2) Never done    There are no preventive care reminders to display for this patient.   Lab Results  Component Value Date   TSH 2.930 07/06/2020   Lab Results  Component Value Date   WBC 3.0 (L) 09/10/2020   HGB 13.3 09/10/2020   HCT 39.0 09/10/2020   MCV 86.7 09/10/2020   PLT 171 09/10/2020   Lab Results  Component Value Date   NA 141 09/10/2020   K 4.1 09/10/2020   CO2 24 09/10/2020   GLUCOSE 78 09/10/2020   BUN 22 09/10/2020   CREATININE 1.20 09/10/2020   BILITOT 0.3 07/06/2020   ALKPHOS 81 07/06/2020   AST 16 07/06/2020   ALT 6 07/06/2020   PROT 6.8 07/06/2020   ALBUMIN 4.3 07/06/2020   CALCIUM 8.2 (L) 09/10/2020   ANIONGAP 6 09/10/2020   EGFR 62  07/06/2020   Lab Results  Component Value Date   CHOL 184 11/14/2019   Lab Results  Component Value Date   HDL 70 11/14/2019   Lab Results  Component Value Date   LDLCALC 98 11/14/2019   Lab Results  Component Value Date   TRIG 87 11/14/2019   Lab Results  Component Value Date   CHOLHDL 2.6 11/14/2019   Lab Results  Component Value Date   HGBA1C 5.4 03/09/2020       Assessment & Plan:   Problem List Items  Addressed This Visit       Other   Other fatigue - Primary    Fatigue not improved the past few weeks.  Patient recently had heart surgery, was treated for tick bite prior to this visit.  Patient reports he started feeling fatigue before his heart surgery and repeat back.  Patient reports he normally takes about a mile walk every day and this has become increasingly difficult to achieve.  No pain, fever, nausea, palpitations or dizziness.  Patient did not take doxycycline that was given in the ER as prophylaxis for tick bite.  Completed Lyme ABS test/Western blot labs yesterday while visiting with different provider.  Today we completed CBC, CMP, and anemia panel.  Advised patient to increase hydration and follow-up with worsening unresolved symptoms.       Relevant Orders   CBC with Differential   Anemia Profile B   Comprehensive metabolic panel     No orders of the defined types were placed in this encounter.    Ivy Lynn, NP

## 2020-09-15 NOTE — Patient Instructions (Signed)

## 2020-09-15 NOTE — Assessment & Plan Note (Signed)
Fatigue not improved the past few weeks.  Patient recently had heart surgery, was treated for tick bite prior to this visit.  Patient reports he started feeling fatigue before his heart surgery and repeat back.  Patient reports he normally takes about a mile walk every day and this has become increasingly difficult to achieve.  No pain, fever, nausea, palpitations or dizziness.  Patient did not take doxycycline that was given in the ER as prophylaxis for tick bite.  Completed Lyme ABS test/Western blot labs yesterday while visiting with different provider.  Today we completed CBC, CMP, and anemia panel.  Advised patient to increase hydration and follow-up with worsening unresolved symptoms.

## 2020-09-16 LAB — ANEMIA PROFILE B
Basophils Absolute: 0 10*3/uL (ref 0.0–0.2)
Basos: 0 %
EOS (ABSOLUTE): 0.1 10*3/uL (ref 0.0–0.4)
Eos: 2 %
Ferritin: 129 ng/mL (ref 30–400)
Folate: >20 ng/mL (ref >3.0)
Hematocrit: 44 % (ref 37.5–51.0)
Hemoglobin: 13.9 g/dL (ref 13.0–17.7)
Immature Grans (Abs): 0 10*3/uL (ref 0.0–0.1)
Immature Granulocytes: 0 %
Iron Saturation: 36 % (ref 15–55)
Iron: 73 ug/dL (ref 38–169)
Lymphocytes Absolute: 0.9 10*3/uL (ref 0.7–3.1)
Lymphs: 32 %
MCH: 26.6 pg (ref 26.6–33.0)
MCHC: 31.6 g/dL (ref 31.5–35.7)
MCV: 84 fL (ref 79–97)
Monocytes Absolute: 0.3 10*3/uL (ref 0.1–0.9)
Monocytes: 11 %
Neutrophils Absolute: 1.5 10*3/uL (ref 1.4–7.0)
Neutrophils: 55 %
Platelets: 185 10*3/uL (ref 150–450)
RBC: 5.22 x10E6/uL (ref 4.14–5.80)
RDW: 15.1 % (ref 11.6–15.4)
Retic Ct Pct: 0.4 % — ABNORMAL LOW (ref 0.6–2.6)
Total Iron Binding Capacity: 205 ug/dL — ABNORMAL LOW (ref 250–450)
UIBC: 132 ug/dL (ref 111–343)
Vitamin B-12: 317 pg/mL (ref 232–1245)
WBC: 2.7 10*3/uL — ABNORMAL LOW (ref 3.4–10.8)

## 2020-09-18 ENCOUNTER — Other Ambulatory Visit: Payer: Medicare Other

## 2020-09-18 LAB — ROCKY MTN SPOTTED FVR ABS PNL(IGG+IGM)
RMSF IgG: UNDETERMINED
RMSF IgM: 0.25 index (ref 0.00–0.89)

## 2020-09-18 LAB — LYME DISEASE SEROLOGY W/REFLEX: Lyme Total Antibody EIA: NEGATIVE

## 2020-09-18 LAB — RMSF, IGG, IFA: RMSF, IGG, IFA: 1:64 {titer} — ABNORMAL HIGH

## 2020-09-21 ENCOUNTER — Other Ambulatory Visit: Payer: Self-pay | Admitting: Family Medicine

## 2020-09-21 DIAGNOSIS — A770 Spotted fever due to Rickettsia rickettsii: Secondary | ICD-10-CM

## 2020-09-21 MED ORDER — DOXYCYCLINE HYCLATE 100 MG PO TABS
100.0000 mg | ORAL_TABLET | Freq: Two times a day (BID) | ORAL | 0 refills | Status: DC
Start: 2020-09-21 — End: 2020-12-04

## 2020-09-23 ENCOUNTER — Telehealth: Payer: Self-pay | Admitting: Family Medicine

## 2020-09-23 NOTE — Telephone Encounter (Signed)
It was negative for Lyme disease but it was positive for [facility] spotted fever and so the doxycycline you are taking is for the [facility] spotted fever which could cause a lot of symptoms like dizziness and fevers and chills and body aches and can be somewhat severe.  So I would recommend taking doxycycline.

## 2020-09-23 NOTE — Telephone Encounter (Signed)
Pt aware of labs - reviewed   He is aware to start DOXY for RMSF - he is questioning the coumadin change.   Please clarify what the doxy does to the coumadin, please.  He is going to take first dose of DOXY now, does want to hear about coumadin, before he take that this evening.

## 2020-09-23 NOTE — Telephone Encounter (Signed)
Doxycycline interacts in the liver with the breakdown of Coumadin so that it builds up in the bloodstream more.  It looks like he normally takes 5 mg tablets and most of the week so I would just take a half of a 5 mg tablet every day while he is on the doxycycline and then go back to his normal dosing after he finishes.

## 2020-09-23 NOTE — Telephone Encounter (Signed)
Pt needs to talk to nurse before taking doxcy abx. Please call back

## 2020-09-23 NOTE — Telephone Encounter (Signed)
Patient aware.

## 2020-09-23 NOTE — Telephone Encounter (Signed)
Patient contacted nurse triage mainly concerned about dizzines that presented Monday afternoon. Patient admits dizzy spell Monday evening resulting in fall, no injury.  Dizzy spells and light headedness has persisted up until present. Patient admits post surgery for mitral valve repair and aortic dissection. Does not believe it is related.  Secondarily, patient was tested for Lyme disease and given doxycycline for antibiotic treatment.  Patient has viewed results and states that he is negative for Lyme. He questions whether or not he really needs to take the antibiotic. He states that he would prefer not too.  Please advise

## 2020-09-24 ENCOUNTER — Other Ambulatory Visit: Payer: Self-pay

## 2020-09-24 ENCOUNTER — Encounter: Payer: Self-pay | Admitting: Thoracic Surgery (Cardiothoracic Vascular Surgery)

## 2020-09-24 ENCOUNTER — Telehealth (INDEPENDENT_AMBULATORY_CARE_PROVIDER_SITE_OTHER): Payer: Medicare Other | Admitting: Thoracic Surgery (Cardiothoracic Vascular Surgery)

## 2020-09-24 DIAGNOSIS — Z9889 Other specified postprocedural states: Secondary | ICD-10-CM

## 2020-09-24 NOTE — Patient Instructions (Signed)
Continue all previous medications without any changes at this time  Please remember the need for life long follow-up and recommendations regarding physical restrictions related to the history of aortic dissection.  These include the need for strict blood pressure control, periodic radiographic follow-up examinations, and avoidance of certain types of physical activity such as heavy lifting or other strenuous physical activities which tend to increase pressure within the chest or abdomen or cause sudden increase in your blood pressure.  You may otherwise resume unrestricted physical activity without any particular limitations at this time.  Endocarditis is a potentially serious infection of heart valves or inside lining of the heart.  It occurs more commonly in patients with diseased heart valves (such as patient's with aortic or mitral valve disease) and in patients who have undergone heart valve repair or replacement.  Certain surgical and dental procedures may put you at risk, such as dental cleaning, other dental procedures, or any surgery involving the respiratory, urinary, gastrointestinal tract, gallbladder or prostate gland.   To minimize your chances for develooping endocarditis, maintain good oral health and seek prompt medical attention for any infections involving the mouth, teeth, gums, skin or urinary tract.    Always notify your doctor or dentist about your underlying heart valve condition before having any invasive procedures. You will need to take antibiotics before certain procedures, including all routine dental cleanings or other dental procedures.  Your cardiologist or dentist should prescribe these antibiotics for you to be taken ahead of time.

## 2020-09-24 NOTE — Progress Notes (Signed)
[address]       [place] [address]             [phone]     CARDIOTHORACIC SURGERY TELEPHONE VIRTUAL OFFICE NOTE  Primary Cardiologist is Shelva Majestic, MD PCP is Dettinger, Fransisca Kaufmann, MD   HPI:  I spoke with Patrick Bell (DOB [birth year] ) via telephone on 09/24/2020 at 10:55 AM and verified that I was speaking with the correct person using more than one form of identification.  We discussed the fact that I was contacting them from my office and they were located at home, as well as the reason(s) for conducting our visit virtually instead of in-person.  The patient expressed understanding the circumstances and agreed to proceed as described.  Patient is an 80 year old male with history of hypertension, obstructive sleep apnea, hyperlipidemia, GE reflux disease, and remote history of heavy tobacco use who underwent mitral valve repair on March 11, 2020.  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  The patient's early postoperative recovery was slow and complicated by postoperative atrial fibrillation and the development of pericardial effusion requiring subxiphoid pericardial window on March 22, 2020.  He eventually was discharged to the inpatient rehab service where he continued to recover without further complication.  Since then he has been followed carefully in our office and by Dr. Claiborne Billings.  Routine follow-up echocardiogram performed May 07, 2020 revealed normal left ventricular function with ejection fraction estimated 60 to 65%.  Mitral valve repair remained intact with trivial mitral regurgitation.  Mean transvalvular gradient across the mitral valve  was reported 4 mmHg.  I spoke with the patient over the telephone today to see how he is doing at this point in time and to discuss results of recent CT angiogram of the chest, abdomen, and pelvis performed because of his previous aortic dissection.  Patient states that he is doing remarkably well.  He states that he is essentially back to close to normal physical activity.  He has still been careful not to do any sort of heavy lifting or straining.  He states that his wife will not let him do too much but overall he is now back doing all of the physical things that he enjoyed doing previously.  He still has some numbness in his right side related to the minithoracotomy incision.  Otherwise everything seems to be doing fine.  He remains anticoagulated using Coumadin.  He is frustrated with how difficult it is to keep the INR under control.  He has not had any palpitations or other symptoms to suggest a recurrence of atrial fibrillation.  He is scheduled to see Dr. Claiborne Billings for routine follow-up early next month.  Overall he is quite pleased with his outcome.   Current Outpatient Medications  Medication Sig Dispense Refill   acetaminophen (TYLENOL) 325 MG tablet Take 1-2 tablets (325-650 mg total) by mouth every 4 (four) hours as needed for mild pain.     albuterol (PROAIR HFA) 108 (90 Base) MCG/ACT inhaler Inhale 2 puffs into the lungs every 6 (six) hours as needed for wheezing. 8.5 g 2   amLODipine (NORVASC) 2.5 MG tablet Take 1 tablet (2.5 mg total) by mouth daily. [place]  tablet 3   doxycycline (VIBRA-TABS) 100 MG tablet Take 1 tablet (100 mg total) by mouth 2 (two) times daily. 1 po bid 28 tablet 0   levothyroxine (SYNTHROID) 50 MCG tablet Take 1 tablet (50 mcg total) by mouth daily. 90 tablet 3   metoprolol succinate (TOPROL-XL) 50 MG 24 hr tablet Take 1 tablet (50 mg total) by mouth daily. Take with or immediately following a meal. 90 tablet 3   montelukast (SINGULAIR) 10 MG tablet Take 1 tablet (10 mg  total) daily by mouth. 90 tablet 3   Multiple Vitamin (MULTIVITAMIN WITH MINERALS) TABS tablet Take 1 tablet by mouth daily.     Olopatadine HCl 0.2 % SOLN Place 1 drop into both eyes daily as needed (allergies).     pantoprazole (PROTONIX) 40 MG tablet Take 1 tablet (40 mg total) daily by mouth. 90 tablet 3   simvastatin (ZOCOR) 10 MG tablet Take 1 tablet (10 mg total) by mouth at bedtime. 90 tablet 3   warfarin (COUMADIN) 5 MG tablet Take 1 tablet (5 mg total) by mouth daily at 4 PM. 30 tablet 3   No current facility-administered medications for this visit.     Diagnostic Tests:     ECHOCARDIOGRAM REPORT         Patient Name:   Patrick Bell Date of Exam: 05/07/2020  Medical Rec #:  [id number]       Height:       70.0 in  Accession #:    [id number]      Weight:       170.4 lb  Date of Birth:  04-12-1940       BSA:          1.950 m  Patient Age:    21 years        BP:           127/70 mmHg  Patient Gender: M               HR:           71 bpm.  Exam Location:  [facility]   Procedure: 2D Echo, Cardiac Doppler and Color Doppler   Indications:    I34.0 Mitral Regurgitation                  Z98.890     History:        Patient has prior history of Echocardiogram examinations,  most                  recent 03/28/2020. Mitral Valve Disease; Risk  Factors:Family                  History of Coronary Artery Disease, Hypertension,  Dyslipidemia                  and Former Smoker. Mitral Valve Repair (03/11/20, 46mm  Sorin Memo                  Ring) Complicated by type A aortic dissection and  pericardial                  effusion. Status Post Repair and pericardial window.                     Mitral Valve: 32 mm prosthetic annuloplasty ring valve is                  present in the mitral  position. Procedure Date: 03/11/2020.     Sonographer:    Deliah Boston RDCS  Referring Phys: [place]     1. Left ventricular ejection fraction, by estimation,  is 60 to 65%. The  left ventricle has normal function. The left ventricle has no regional  wall motion abnormalities. Left ventricular diastolic function could not  be evaluated.   2. Right ventricular systolic function is normal. The right ventricular  size is normal. There is normal pulmonary artery systolic pressure.   3. Left atrial size was moderately dilated.   4. Right atrial size was mildly dilated.   5. The mitral valve has been repaired/replaced. Trivial mitral valve  regurgitation. The mean mitral valve gradient is 4.0 mmHg. There is a 32  mm prosthetic annuloplasty ring present in the mitral position. Procedure  Date: 03/11/2020.   6. The aortic valve is tricuspid. There is mild calcification of the  aortic valve. There is mild thickening of the aortic valve. Aortic valve  regurgitation is trivial.   7. The inferior vena cava is normal in size with greater than 50%  respiratory variability, suggesting right atrial pressure of 3 mmHg.   Comparison(s): No significant change from prior study.   FINDINGS   Left Ventricle: Left ventricular ejection fraction, by estimation, is 60  to 65%. The left ventricle has normal function. The left ventricle has no  regional wall motion abnormalities. The left ventricular internal cavity  size was normal in size. There is   borderline left ventricular hypertrophy. Left ventricular diastolic  function could not be evaluated due to mitral valve repair. Left  ventricular diastolic function could not be evaluated.   Right Ventricle: The right ventricular size is normal. No increase in  right ventricular wall thickness. Right ventricular systolic function is  normal. There is normal pulmonary artery systolic pressure. The tricuspid  regurgitant velocity is 2.28 m/s, and   with an assumed right atrial pressure of 3 mmHg, the estimated right  ventricular systolic pressure is 83.3 mmHg.   Left Atrium: Left atrial size was moderately dilated.    Right Atrium: Right atrial size was mildly dilated.   Pericardium: Trivial pericardial effusion is present.   Mitral Valve: The mitral valve has been repaired/replaced. Trivial mitral  valve regurgitation. There is a 32 mm prosthetic annuloplasty ring present  in the mitral position. Procedure Date: 03/11/2020. MV peak gradient, 10.0  mmHg. The mean mitral valve  gradient is 4.0 mmHg.   Tricuspid Valve: The tricuspid valve is normal in structure. Tricuspid  valve regurgitation is trivial. No evidence of tricuspid stenosis.   Aortic Valve: The aortic valve is tricuspid. There is mild calcification  of the aortic valve. There is mild thickening of the aortic valve. Aortic  valve regurgitation is trivial.   Pulmonic Valve: The pulmonic valve was not well visualized. Pulmonic valve  regurgitation is mild to moderate.   Aorta: Aortic root could not be assessed and the aortic root, ascending  aorta, aortic arch and descending aorta are all structurally normal, with  no evidence of dilitation or obstruction.   Venous: The inferior vena cava is normal in size with greater than 50%  respiratory variability, suggesting right atrial pressure of 3 mmHg.   IAS/Shunts: The atrial septum is grossly normal.      LEFT VENTRICLE  PLAX 2D  LVIDd:         5.25 cm  Diastology  LVIDs:  3.40 cm  LV e' medial:    5.33 cm/s  LV PW:         1.20 cm  LV E/e' medial:  24.6  LV IVS:        0.75 cm  LV e' lateral:   9.36 cm/s  LVOT diam:     2.30 cm  LV E/e' lateral: 14.0  LV SV:         96  LV SV Index:   49  LVOT Area:     4.15 cm      RIGHT VENTRICLE  RV S prime:     6.96 cm/s  TAPSE (M-mode): 1.4 cm   LEFT ATRIUM             Index       RIGHT ATRIUM           Index  LA diam:        5.20 cm 2.67 cm/m  RA Area:     18.00 cm  LA Vol (A2C):   93.8 ml 48.10 ml/m RA Volume:   46.60 ml  23.90 ml/m  LA Vol (A4C):   47.5 ml 24.36 ml/m  LA Biplane Vol: 73.6 ml 37.74 ml/m   AORTIC  VALVE  LVOT Vmax:   95.80 cm/s  LVOT Vmean:  60.700 cm/s  LVOT VTI:    0.230 m     AORTA  Ao Root diam: 4.10 cm  Ao Asc diam:  3.10 cm   MITRAL VALVE                TRICUSPID VALVE  MV Area (PHT): cm          TR Peak grad:   20.8 mmHg  MV Area VTI:   1.92 cm     TR Vmax:        228.00 cm/s  MV Peak grad:  10.0 mmHg  MV Mean grad:  4.0 mmHg     SHUNTS  MV Vmax:       1.58 m/s     Systemic VTI:  0.23 m  MV Vmean:      86.4 cm/s    Systemic Diam: 2.30 cm  MV Decel Time: 258 msec  MV E velocity: 131.00 cm/s  MV A velocity: 152.00 cm/s  MV E/A ratio:  0.86   Buford Dresser MD  Electronically signed by Buford Dresser MD  Signature Date/Time: 05/07/2020/8:04:27 PM         CT ANGIOGRAPHY CHEST, ABDOMEN AND PELVIS   TECHNIQUE: Non-contrast CT of the chest was initially obtained.   Multidetector CT imaging through the chest, abdomen and pelvis was performed using the standard protocol during bolus administration of intravenous contrast. Multiplanar reconstructed images and MIPs were obtained and reviewed to evaluate the vascular anatomy.   CONTRAST:  93mL OMNIPAQUE IOHEXOL 350 MG/ML SOLN   COMPARISON:  June 12, 2020.   FINDINGS: CTA CHEST FINDINGS   Cardiovascular: Status post surgical repair of ascending thoracic aorta. Status post mitral valve repair. Stable appearance of residual dissection is seen that begins at the origin of the innominate artery and extends through the transverse aortic arch and descending thoracic aorta into the abdominal aorta. Stable dissection flap is seen extending into the left subclavian artery. Normal cardiac size. No pericardial effusion.   Mediastinum/Nodes: No enlarged mediastinal, hilar, or axillary lymph nodes. Thyroid gland, trachea, and esophagus demonstrate no significant findings.   Lungs/Pleura: No pneumothorax or pleural effusion is noted. 6 mm nodule is noted  inferiorly in right middle lobe best seen on  image number 114 of series 7. No consolidative process is noted.   Musculoskeletal: No chest wall abnormality. No acute or significant osseous findings.   Review of the MIP images confirms the above findings.   CTA ABDOMEN AND PELVIS FINDINGS   VASCULAR   Aorta: Thoracic aortic dissection noted on prior exam extends throughout the abdominal aorta and into the common and external iliac arteries bilaterally no definite aneurysm is noted.   Celiac: Patent without evidence of aneurysm, dissection, vasculitis or significant stenosis.   SMA: Stable dissection is seen involving the proximal portion of the superior mesenteric artery which is unchanged compared to prior exam.   Renals: Stable dissection is seen extending through the left renal artery. Stable dissection is seen involving the proximal portion of the right renal artery.   IMA: Patent, but arises from the false lumen.   Inflow: As noted above, dissection is seen to extend through the bilateral common and external iliac arteries and into left common femoral artery. This is stable compared to prior exam.   Veins: No obvious venous abnormality within the limitations of this arterial phase study.   Review of the MIP images confirms the above findings.   NON-VASCULAR   Hepatobiliary: No focal liver abnormality is seen. No gallstones, gallbladder wall thickening, or biliary dilatation.   Pancreas: Unremarkable. No pancreatic ductal dilatation or surrounding inflammatory changes.   Spleen: Normal in size without focal abnormality.   Adrenals/Urinary Tract: Adrenal glands are unremarkable. Kidneys are normal, without renal calculi, focal lesion, or hydronephrosis. Bladder is unremarkable.   Stomach/Bowel: Stomach is within normal limits. Appendix appears normal. No evidence of bowel wall thickening, distention, or inflammatory changes. Diverticulosis of descending and sigmoid colon is noted without inflammation.    Lymphatic: No adenopathy is noted.   Reproductive: Prostate is unremarkable.   Other: No abdominal wall hernia or abnormality. No abdominopelvic ascites.   Musculoskeletal: No acute or significant osseous findings.   Review of the MIP images confirms the above findings.   IMPRESSION: Status post surgical repair of ascending thoracic aorta as well as mitral valve repair.   Grossly stable appearance thoracic aortic dissection that includes the transverse aortic arch and descending thoracic aorta. It extends through the abdominal aorta and into bilateral common and external iliac arteries and left common femoral artery.   Dissection flap is seen involving the left subclavian artery, superior mesenteric artery and both renal arteries.   6 mm nodule is noted in right middle lobe. Non-contrast chest CT at 6-12 months is recommended. If the nodule is stable at time of repeat CT, then future CT at 18-24 months (from today's scan) is considered optional for low-risk patients, but is recommended for high-risk patients. This recommendation follows the consensus statement: Guidelines for Management of Incidental Pulmonary Nodules Detected on CT Images: From the Fleischner Society 2017; Radiology 2017; 284:228-243.   Diverticulosis of descending and sigmoid colon without inflammation.     Electronically Signed   By: Marijo Conception M.D.   On: 09/10/2020 14:10   Impression:  Patient is doing well approximately 6 months status post mitral valve repair which was complicated by acute type a aortic dissection.  I personally reviewed the patient's recent follow-up CT angiogram which revealed stable anatomical appearance and diameter of the chronic dissection involving the transverse aortic arch, descending thoracic and abdominal aorta.    Plan:  We have not recommended any changes to the patient's current medications  or therapy.  However, it might be reasonable to stop anticoagulation  using warfarin as long as there are no signs to suggest that the patient continues to have episodes of paroxysmal atrial fibrillation.  We will defer this decision to the discretion of Dr. Claiborne Billings.  We will plan for the patient undergo follow-up CT angiogram in approximately 6 months.  The patient has been advised regarding the need for long-term follow-up surveillance imaging because of the chronic dissection of his aorta.  The patient understands that I will be leaving the practice in [place] and he will see a different surgeon in follow-up after his next scan.  He has also requested an office appointment to see either Lars Pinks or Nicholes Rough in the office in the near future for an in person visit for wound check.  We will make sure that this occurs.  All of his questions have been answered.   I discussed limitations of evaluation and management via telephone.  The patient was advised to call back for repeat telephone consultation or to seek an in-person evaluation if questions arise or the patient's clinical condition changes in any significant manner.  I spent in excess of 10 minutes of non-face-to-face time during the conduct of this telephone virtual office consultation, including pre-visit review of the patient's records and direct conversation with the patient.      Valentina Gu. Roxy Manns, MD 09/24/2020 10:55 AM

## 2020-09-28 ENCOUNTER — Ambulatory Visit: Payer: Medicare Other

## 2020-09-28 ENCOUNTER — Telehealth: Payer: Medicare Other | Admitting: Thoracic Surgery (Cardiothoracic Vascular Surgery)

## 2020-09-29 ENCOUNTER — Other Ambulatory Visit: Payer: Self-pay | Admitting: Family Medicine

## 2020-09-29 DIAGNOSIS — H43813 Vitreous degeneration, bilateral: Secondary | ICD-10-CM | POA: Diagnosis not present

## 2020-09-29 DIAGNOSIS — H52201 Unspecified astigmatism, right eye: Secondary | ICD-10-CM | POA: Diagnosis not present

## 2020-09-29 DIAGNOSIS — H0100A Unspecified blepharitis right eye, upper and lower eyelids: Secondary | ICD-10-CM | POA: Diagnosis not present

## 2020-09-29 DIAGNOSIS — H02403 Unspecified ptosis of bilateral eyelids: Secondary | ICD-10-CM | POA: Diagnosis not present

## 2020-09-29 DIAGNOSIS — Z961 Presence of intraocular lens: Secondary | ICD-10-CM | POA: Diagnosis not present

## 2020-09-29 DIAGNOSIS — H0100B Unspecified blepharitis left eye, upper and lower eyelids: Secondary | ICD-10-CM | POA: Diagnosis not present

## 2020-09-29 DIAGNOSIS — H527 Unspecified disorder of refraction: Secondary | ICD-10-CM | POA: Diagnosis not present

## 2020-09-29 DIAGNOSIS — H1045 Other chronic allergic conjunctivitis: Secondary | ICD-10-CM | POA: Diagnosis not present

## 2020-09-29 DIAGNOSIS — D3132 Benign neoplasm of left choroid: Secondary | ICD-10-CM | POA: Diagnosis not present

## 2020-09-29 DIAGNOSIS — H26491 Other secondary cataract, right eye: Secondary | ICD-10-CM | POA: Diagnosis not present

## 2020-10-06 ENCOUNTER — Ambulatory Visit: Payer: Medicare Other | Admitting: Cardiovascular Disease

## 2020-10-06 ENCOUNTER — Other Ambulatory Visit: Payer: Self-pay

## 2020-10-06 ENCOUNTER — Encounter: Payer: Self-pay | Admitting: Cardiovascular Disease

## 2020-10-06 ENCOUNTER — Ambulatory Visit (INDEPENDENT_AMBULATORY_CARE_PROVIDER_SITE_OTHER): Payer: Medicare Other | Admitting: Pharmacist Clinician (PhC)/ Clinical Pharmacy Specialist

## 2020-10-06 DIAGNOSIS — I480 Paroxysmal atrial fibrillation: Secondary | ICD-10-CM | POA: Diagnosis not present

## 2020-10-06 DIAGNOSIS — I340 Nonrheumatic mitral (valve) insufficiency: Secondary | ICD-10-CM | POA: Diagnosis not present

## 2020-10-06 DIAGNOSIS — I7101 Dissection of thoracic aorta: Secondary | ICD-10-CM

## 2020-10-06 DIAGNOSIS — E785 Hyperlipidemia, unspecified: Secondary | ICD-10-CM

## 2020-10-06 DIAGNOSIS — Z9889 Other specified postprocedural states: Secondary | ICD-10-CM

## 2020-10-06 DIAGNOSIS — E039 Hypothyroidism, unspecified: Secondary | ICD-10-CM

## 2020-10-06 DIAGNOSIS — I71019 Dissection of thoracic aorta, unspecified: Secondary | ICD-10-CM

## 2020-10-06 DIAGNOSIS — G4733 Obstructive sleep apnea (adult) (pediatric): Secondary | ICD-10-CM | POA: Diagnosis not present

## 2020-10-06 DIAGNOSIS — D699 Hemorrhagic condition, unspecified: Secondary | ICD-10-CM | POA: Diagnosis not present

## 2020-10-06 DIAGNOSIS — Z7901 Long term (current) use of anticoagulants: Secondary | ICD-10-CM

## 2020-10-06 DIAGNOSIS — I10 Essential (primary) hypertension: Secondary | ICD-10-CM

## 2020-10-06 LAB — POCT INR: INR: 1.3 — AB (ref 2.0–3.0)

## 2020-10-06 NOTE — Patient Instructions (Addendum)
Medication Instructions:  Resume Metoprolol. Resume Synthroid *If you need a refill on your cardiac medications before your next appointment, please call your pharmacy*   Lab Work:  No Labs If you have labs (blood work) drawn today and your tests are completely normal, you will receive your results only by: [place] (if you have MyChart) OR A paper copy in the mail If you have any lab test that is abnormal or we need to change your treatment, we will call you to review the results.   Testing/Procedures: [facility], [place] [employer] Your physician has recommended that you have a sleep study. This test records several body functions during sleep, including: brain activity, eye movement, oxygen and carbon dioxide blood levels, heart rate and rhythm, breathing rate and rhythm, the flow of air through your mouth and nose, snoring, body muscle movements, and chest and belly movement.    Follow-Up: At [facility], you and your health needs are our priority.  As part of our continuing mission to provide you with exceptional heart care, we have created designated Provider Care Teams.  These Care Teams include your primary Cardiologist (physician) and Advanced Practice Providers (APPs -  Physician Assistants and Nurse Practitioners) who all work together to provide you with the care you need, when you need it.  We recommend signing up for the patient portal called "MyChart".  Sign up information is provided on this After Visit Summary.  MyChart is used to connect with patients for Virtual Visits (Telemedicine).  Patients are able to view lab/test results, encounter notes, upcoming appointments, etc.  Non-urgent messages can be sent to your provider as well.   To learn more about what you can do with MyChart, go to [email or web address].    Your next appointment:   6 month(s)  The format for your next appointment:   In Person  Provider:   Shelva Majestic, MD

## 2020-10-06 NOTE — Progress Notes (Signed)
Cardiology Office Note    Date:  10/06/2020   ID:  NOEMI ISHMAEL, DOB Jul 16, 1940, MRN [id number]  PCP:  Dettinger, Fransisca Kaufmann, MD  Cardiologist:  Shelva Majestic, MD   3 month F/U evaluation   History of Present Illness:  JOHNPAUL GILLENTINE is a 80 y.o. male who has a history of hypertension, hyperlipidemia, asthma as well as a history of obstructive sleep apnea initially diagnosed in 2016 .  At that time his AHI was 29.5, supine AHI, however was 56.9 and O2 desaturation was 82%.  He initially used CPAP but essentially stopped using therapy for over 3 years.  He has a history of hypertension and remotely was on amlodipine but also has been off treatment for at least a year.  When I saw him for initial evaluation he had recently started to notice blood pressure elevation as well as mild shortness of breath.  He was recently evaluated by Dr. Warrick Parisian and referred for an echo Doppler study on November 25, 2019 which revealed normal LV function with an EF of 60 to 65% with indeterminant diastolic parameters.  RV systolic function was normal.  There was mild LA dilation.  He was felt to have severe mitral regurgitation with moderate holosystolic prolapse of the middle scallop of the posterior leaflet of the mitral valve.  There was moderate mitral annular calcification in the severe MR had eccentric anteriorly directed jet.  There was no evidence for mitral stenosis.   He was referred for cardiology evaluation.    When I initially saw him he denied any awareness of rhythm abnormality.  He was sleeping poorly and was unable to sleep on his back.  An Epworth Sleepiness Scale score calculated in the office was significantly increased at 18 consistent with excessive daytime sleepiness.  At my initial evaluation, I recommended that he undergo a transesophageal echocardiogram to assess his severe mitral regurgitation and possible potential flail leaflet.  I also recommended he undergo a sleep study.   The patient  subsequently underwent TEE which confirmed the presence of mitral valve prolapse with flail segment involving a portion of the posterior leaflet and severe mitral regurgitation.   He was subsequently seen by Almyra Deforest, PA and was referred to Dr. Darylene Price for surgical consultation.  He saw Dr. Roxy Manns on January 09, 2020 who felt he was a good candidate for mitral valve repair.  He subsequently has undergone CT imaging studies and was referred for right and left heart cardiac catheterization prior to planned surgery in early December.   He underwent his diagnostic polysomnogram on January 30, 2020 but did not meet split-night criteria and so his initial study was only diagnostic polysomnogram.  This reconfirmed moderate overall sleep apnea with an AHI of 23.1/h; however, sleep apnea was very severe in the supine position with an AHI of 52.2/h.   He subsequently underwent cardiac catheterization by me on February 21, 2020 prior to his planned valve surgery with Dr. Roxy Manns.  He had normal right heart pressures with a PA mean at 20 mm.  Coronary arteries were normal.  LVEDP was 16 mm.  He underwent elective mitral valve repair with complex valvuloplasty by Dr. Mayer Camel on March 11, 2020.  The procedure was complicated by an acute type a aortic dissection requiring graft repair of his aorta and resuspension of his native aortic valve.  His hospital course was further complicated by pericardial effusion requiring pericardial window, atrial fibrillation treated with amiodarone and Coumadin, anemia requiring transfusion,  AKA, hypotension as well as a new diagnosis of hypothyroidism.  After his pericardial window he converted back to sinus rhythm.  He was discharged to inpatient rehab on March 31, 2020 and was ultimately discharged from the rehab unit on April 08, 2019.  He was seen in follow-up by CT surgery on April 13, 2020 and at that time had complaints of unintentional weight loss with decreased appetite  fatigability and weakness.  He was eating smaller meals.  He was having difficulty with significant fatigability and daytime sleepiness.  There was concern that his hypothyroidism may be playing a role and he was encouraged to follow-up with his PCP.  He was evaluated by [name], PA in a telemedicine visit on April 21, 2020.  At that time, his blood pressure was controlled.  Creatinine was stable at 1.52 as was his anemia with a hemoglobin of 8.8.  I saw him for his initial post operative evaluation with me on May 11, 2020.  At that time he denied any chest pain or awareness of palpitations.  I reviewed his echo Doppler study from May 07, 2020 which showed an EF of 60 to 65%.  There was normal pulmonary artery systolic pressure.  There was moderate left atrial dilatation and mild dilation of his right atrium.  There was a 32 mm prosthetic annuloplasty ring in the mitral position with trivial MR.  The mean mitral gradient was 4.  Aortic sclerosis without stenosis.  He was significantly fatigued.  He was experiencing frequent nocturia up to 4-5 times per night and often was taking 2 naps per day.  He denies any chest pain or arrhythmia.  He is not sleeping well.  He is to start cardiac rehab.  He is always tired.  He has nocturia at least 4-5 times per night and was  taking 2 naps per day.  During that evaluation, reviewed his sleep study was done on January 30, 2020 due to his cardiac surgery he never underwent follow-up titration.  He had moderate overall sleep apnea with an AHI of 23.1 and O2 nadir at 83%.  At that time, since he had a CPAP ResMed air sense 10 AutoSet unit with an original set up date in February 2016 I recommended AutoPap at 7 to 20 cm.  He  was seen by Dr. Roxy Manns on May 25, 2020 and was felt to be doing well 3 months status post mitral valve repair complicated by his acute type a aortic dissection requiring surgical repair.  He subsequently underwent CT angiography on June 12, 2020 of his chronic dissection involving the remaining transverse and descending thoracic aorta as well as abdominal aorta.  CT imaging revealed persistent aortic dissection flap through the arch, descending thoracic, abdominal aorta, bilateral iliac arteries, and left common femoral artery.  The dissection flap involves bilateral subclavian arteries, celiac, SMA, and bilateral renal arteries.  I saw him for reevaluation on July 14, 2020.  Since his prior evaluation he had  initiated CPAP therapy.  He has felt improved since initiating treatment.  A download was obtained from February 10 through June 12, 2020 which shows usage at only 50% of days with usage at 3 hours and 22 minutes.  His CPAP auto range was set initially at 7 to 20 cm and his 95% average was 14.8 with a maximum average of 16.4.  Presently, he is sleeping better.  He continues to be fatigued.  He had undergone repeat laboratory and renal function has improved with  a creatinine of 1.64 on April 27, 2020 to July 06, 2020 creatinine at 1.19.  He was unaware of any arrhythmias.  During his evaluation, I reviewed his recent CT with him in detail as noted above.  His blood pressure was elevated and I added amlodipine 5 mg to his medical regimen for more optimal blood pressure control and with his chronic dissection recommended titration of metoprolol succinate 25 mg up to 50 mg daily.  He was on warfarin and he recently had self regulated his dose and close INR follow-up was recommended.  He was referred to the emergency room for apparent bilateral blood pressure differential on September 24, 2020.  A follow-up CT of abdomen and pelvis was performed which again confirmed grossly stable thoracic aortic dissection that includes the transverse arch, descending thoracic aorta and extends through the abdominal aorta into the bilateral common and external iliac arteries and left common femoral artery.  Dissection flap was also seen involving the left  subclavian artery superior mesenteric and both renal arteries.  He was evaluated in a telemedicine phone evaluation with Dr. Roxy Manns on September 24, 2020 so reviewed CT findings.  He is aware that Dr. Roxy Manns is leaving the [facility] this month.  It was advised that he have 73-month follow-up CT imaging.  Patient presents to the office today for follow-up evaluation.  Apparently since I saw him, he essentially stopped using CPAP therapy.  He also self discontinued his levothyroxine, and apparently over the past week self discontinued his metoprolol succinate.  He has been on doxycycline for [facility] spotted fever and is completing a doxycycline course.  He was concerned about his need to continue warfarin.  He also continues to experience fatigue but has not been using CPAP but believes he is sleeping okay.  He presents for evaluation.   Past Medical History:  Diagnosis Date   Acute thoracic aortic dissection (HCC) 03/11/2020   Type A   Allergy    Asthma    Cataract    Cataract    Diverticulitis    Dyspnea    GERD (gastroesophageal reflux disease)    Heart murmur 12/2019   Hyperlipidemia    Hypertension    Inguinal hernia bilateral, non-recurrent    S/P aortic dissection repair 03/11/2020   supracoronary straight graft repair with resuspension of native aortic valve and open distal anastomosis for intraoperative acute type A aortic dissection    S/P mitral valve repair 03/11/2020   Complex valvuloplasty including artificial Gore-tex neochord placement x 4, suture plication of posterior leaflet and posteromedial commissure and 32 mm Sorin Memo 4D ring annuloplasty   Sleep apnea     Past Surgical History:  Procedure Laterality Date   bilateral inguinal hernia     CARDIAC CATHETERIZATION Bilateral 02/21/2020   CHEST TUBE INSERTION N/A 03/22/2020   Procedure: CHEST TUBE INSERTION OF 28 BLAKE DRAIN.;  Surgeon: Grace Isaac, MD;  Location: [place] OR;  Service: Thoracic;  Laterality:  N/A;   COLONOSCOPY  06/09/2004   LNL:[id number] colon diverticulosis, otherwise normal colonoscopy   EYE SURGERY  08/2014   HERNIA REPAIR     MITRAL VALVE REPAIR N/A 03/11/2020   Procedure: MITRAL VALVE REPAIR (MVR) USING MEMO 4D SIZE 32 RING;  Surgeon: Rexene Alberts, MD;  Location: [place];  Service: Open Heart Surgery;  Laterality: N/A;   PALATE / UVULA BIOPSY / EXCISION     PERICARDIAL WINDOW N/A 03/22/2020   Procedure: SUBXYPHOID POST-OP PERICARDIAL FLUID DRAINAGE WITH CHEST  TUBE INSERTION.;  Surgeon: Grace Isaac, MD;  Location: [place];  Service: Thoracic;  Laterality: N/A;   REPAIR OF ACUTE ASCENDING THORACIC AORTIC DISSECTION N/A 03/11/2020   Procedure: REPAIR OF ACUTE ASCENDING THORACIC AORTIC DISSECTION USING A HEMASHIELD PLATINUM 26MM STRAIGHT GRAFT AND A HEASHIELD PLATINUM 28X10MM SINGLE ARM GRAFT;  Surgeon: Rexene Alberts, MD;  Location: [place];  Service: Open Heart Surgery;  Laterality: N/A;   RIGHT/LEFT HEART CATH AND CORONARY ANGIOGRAPHY N/A 02/21/2020   Procedure: RIGHT/LEFT HEART CATH AND CORONARY ANGIOGRAPHY;  Surgeon: Troy Sine, MD;  Location: [place] [facility];  Service: Cardiovascular;  Laterality: N/A;   TEE WITHOUT CARDIOVERSION N/A 12/19/2019   Procedure: TRANSESOPHAGEAL ECHOCARDIOGRAM (TEE);  Surgeon: Buford Dresser, MD;  Location[facility] ENDOSCOPY;  Service: Cardiovascular;  Laterality: N/A;   TEE WITHOUT CARDIOVERSION N/A 03/11/2020   Procedure: TRANSESOPHAGEAL ECHOCARDIOGRAM (TEE);  Surgeon: Rexene Alberts, MD;  Location: [place];  Service: Open Heart Surgery;  Laterality: N/A;   TEE WITHOUT CARDIOVERSION N/A 03/22/2020   Procedure: TRANSESOPHAGEAL ECHOCARDIOGRAM (TEE);  Surgeon: Grace Isaac, MD;  Location: [facility] OR;  Service: Thoracic;  Laterality: N/A;   TONSILLECTOMY     VASECTOMY      Current Medications: Outpatient Medications Prior to Visit  Medication Sig Dispense Refill   acetaminophen (TYLENOL) 325 MG tablet Take 1-2 tablets (325-650 mg  total) by mouth every 4 (four) hours as needed for mild pain.     albuterol (PROAIR HFA) 108 (90 Base) MCG/ACT inhaler Inhale 2 puffs into the lungs every 6 (six) hours as needed for wheezing. 8.5 g 2   amLODipine (NORVASC) 2.5 MG tablet Take 1 tablet (2.5 mg total) by mouth daily. 90 tablet 3   doxycycline (VIBRA-TABS) 100 MG tablet Take 1 tablet (100 mg total) by mouth 2 (two) times daily. 1 po bid 28 tablet 0   levothyroxine (SYNTHROID) 50 MCG tablet Take 1 tablet (50 mcg total) by mouth daily. 90 tablet 3   metoprolol succinate (TOPROL-XL) 50 MG 24 hr tablet Take 1 tablet (50 mg total) by mouth daily. Take with or immediately following a meal. 90 tablet 3   montelukast (SINGULAIR) 10 MG tablet Take 1 tablet (10 mg total) daily by mouth. 90 tablet 3   Multiple Vitamin (MULTIVITAMIN WITH MINERALS) TABS tablet Take 1 tablet by mouth daily.     Olopatadine HCl 0.2 % SOLN Place 1 drop into both eyes daily as needed (allergies).     pantoprazole (PROTONIX) 40 MG tablet Take 1 tablet (40 mg total) daily by mouth. 90 tablet 3   simvastatin (ZOCOR) 10 MG tablet Take 1 tablet (10 mg total) by mouth at bedtime. 90 tablet 3   warfarin (COUMADIN) 5 MG tablet Take 1 tablet daily at 4 EVENING. 30 tablet 2   No facility-administered medications prior to visit.     Allergies:   Patient has no known allergies.   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  Tobacco Use   Smoking status: Former    Packs/day: 4.00    Years: 25.00    Pack years: 100.00    Types: Cigarettes    Quit date: 06/23/1982    Years since quitting: 80.3  Smokeless tobacco: Never   Tobacco comments:    pt was a truck Land Use: Never used  Substance and  Sexual Activity   Alcohol use: Yes    Alcohol/week: 5.0 standard drinks    Types: 5 Shots of liquor per week    Comment: brandy a few times a month, beer once a month   Drug use: No   Sexual activity: Yes    Birth control/protection: Surgical  Other Topics Concern   Not on file  Social History Narrative   Not on file   Social Determinants of Health   Financial Resource Strain: Not on file  Food Insecurity: Not on file  Transportation Needs: Not on file  Physical Activity: Not on file  Stress: Not on file  Social Connections: Not on file     Family History:  The patient's family history includes Arthritis in his brother, father, and sister; Dementia (age of onset: 30) in his mother; Heart disease in his father and mother; Hip fracture (age of onset: 56) in his mother; Osteoporosis in his mother.   ROS General: Negative; No fevers, chills, or night sweats;  HEENT: Negative; No changes in vision or hearing, sinus congestion, difficulty swallowing Pulmonary: Negative; No cough, wheezing, shortness of breath, hemoptysis Cardiovascular: see HPI GI: Negative; No nausea, vomiting, diarrhea, or abdominal pain GU: Negative; No dysuria, hematuria, or difficulty voiding Musculoskeletal: Negative; no myalgias, joint pain, or weakness Hematologic/Oncology: Negative; no easy bruising, bleeding Endocrine: Negative; no heat/cold intolerance; no diabetes Neuro: Negative; no changes in balance, headaches Skin: Negative; No rashes or skin lesions Psychiatric: Negative; No behavioral problems, depression Sleep: Positive for OSA with history of very severe sleep apnea with supine position with prior complaints of snoring, nocturia, daytime sleepiness and marked fatigability; nobruxism, restless legs, hypnogognic hallucinations, no cataplexy Other comprehensive 14 point system review is negative.   PHYSICAL EXAM:   VS:  BP 132/74   Pulse 91   Ht $R'5\' 10"'iQ$  (1.778 m)   Wt 181 lb 6.4 oz (82.3 kg)    SpO2 97%   BMI 26.03 kg/m     Today I did not detect any significant blood pressure differential compared to his prior evaluation with blood pressure 136/78 in the right arm and 140/76 in the left arm.  Wt Readings from Last 3 Encounters:  10/06/20 181 lb 6.4 oz (82.3 kg)  09/15/20 179 lb (81.2 kg)  09/10/20 179 lb (81.2 kg)    General: Alert, oriented, no distress.  Skin: normal turgor, no rashes, warm and dry HEENT: Normocephalic, atraumatic. Pupils equal round and reactive to light; sclera anicteric; extraocular muscles intact;  Nose without nasal septal hypertrophy Mouth/Parynx benign; Mallinpatti scale 3 Neck: No JVD, no carotid bruits; normal carotid upstroke Lungs: clear to ausculatation and percussion; no wheezing or rales Chest wall: without tenderness to palpitation Heart: PMI not displaced, RRR, s1 s2 normal, 1/6 systolic murmur, no diastolic murmur, no rubs, gallops, thrills, or heaves Abdomen: soft, nontender; no hepatosplenomehaly, BS+; abdominal aorta nontender and not dilated by palpation. Back: no CVA tenderness Pulses 2+ Musculoskeletal: full range of motion, normal strength, no joint deformities Extremities: no clubbing cyanosis or edema, Homan's sign negative  Neurologic: grossly nonfocal; Cranial nerves grossly wnl Psychologic: Normal mood and affect   Studies/Labs Reviewed:   EKG:  EKG is ordered today.  ECG (independently read by me): Sinus rhythm at 91 bpm; first-degree AV block with a period of 1 to 30 ms.  Nonspecific  ST-T wave abnormality.  Mildly prolonged QTc interval at 460 ms.   ECG (independently read by me):  NSR at 71; no significant STT changes; QTc 478 msec  Recent Labs: BMP Latest Ref Rng & Units 09/15/2020 09/10/2020 09/10/2020  Glucose 65 - 99 mg/dL 84 78 80  BUN 8 - 27 mg/dL $Remove'14 22 18  'YysmQnu$ Creatinine 0.76 - 1.27 mg/dL 1.00 1.20 1.18  BUN/Creat Ratio 10 - 24 14 - -  Sodium 134 - 144 mmol/L 142 141 139  Potassium 3.5 - 5.2 mmol/L 4.3 4.1 4.2   Chloride 96 - 106 mmol/L 106 107 109  CO2 20 - 29 mmol/L 22 - 24  Calcium 8.6 - 10.2 mg/dL 9.0 - 8.2(L)     Hepatic Function Latest Ref Rng & Units 09/15/2020 07/06/2020 04/01/2020  Total Protein 6.0 - 8.5 g/dL 6.0 6.8 5.3(L)  Albumin 3.6 - 4.6 g/dL 3.9 4.3 2.0(L)  AST 0 - 40 IU/L $Remov'20 16 18  'vTPxNd$ ALT 0 - 44 IU/L $Remov'7 6 21  'qiPXsY$ Alk Phosphatase 44 - 121 IU/L 63 81 63  Total Bilirubin 0.0 - 1.2 mg/dL 0.4 0.3 0.5    CBC Latest Ref Rng & Units 09/15/2020 09/10/2020 09/10/2020  WBC 3.4 - 10.8 x10E3/uL 2.7(L) - 3.0(L)  Hemoglobin 13.0 - 17.7 g/dL 13.9 13.3 12.9(L)  Hematocrit 37.5 - 51.0 % 44.0 39.0 41.2  Platelets 150 - 450 x10E3/uL 185 - 171   Lab Results  Component Value Date   MCV 84 09/15/2020   MCV 86.7 09/10/2020   MCV 83 07/06/2020   Lab Results  Component Value Date   TSH 2.930 07/06/2020   Lab Results  Component Value Date   HGBA1C 5.4 03/09/2020     BNP    Component Value Date/Time   BNP 112.7 (H) 09/10/2020 1154    ProBNP No results found for: PROBNP   Lipid Panel     Component Value Date/Time   CHOL 184 11/14/2019 1004   CHOL 168 06/18/2012 1031   TRIG 87 11/14/2019 1004   TRIG 101 01/14/2013 1218   TRIG 102 06/18/2012 1031   HDL 70 11/14/2019 1004   HDL 65 01/14/2013 1218   HDL 62 06/18/2012 1031   CHOLHDL 2.6 11/14/2019 1004   LDLCALC 98 11/14/2019 1004   LDLCALC 99 01/14/2013 1218   LDLCALC 86 06/18/2012 1031   LABVLDL 16 11/14/2019 1004    INR today, subtherapeutic at 1.3.   RADIOLOGY: CT Angio Chest/Abd/Pel for Dissection W and/or W/WO  Result Date: 09/10/2020 CLINICAL DATA:  Chest and back pain. EXAM: CT ANGIOGRAPHY CHEST, ABDOMEN AND PELVIS TECHNIQUE: Non-contrast CT of the chest was initially obtained. Multidetector CT imaging through the chest, abdomen and pelvis was performed using the standard protocol during bolus administration of intravenous contrast. Multiplanar reconstructed images and MIPs were obtained and reviewed to evaluate the vascular  anatomy. CONTRAST:  52mL OMNIPAQUE IOHEXOL 350 MG/ML SOLN COMPARISON:  June 12, 2020. FINDINGS: CTA CHEST FINDINGS Cardiovascular: Status post surgical repair of ascending thoracic aorta. Status post mitral valve repair. Stable appearance of residual dissection is seen that begins at the origin of the innominate artery and extends through the transverse aortic arch and descending thoracic aorta into the abdominal aorta. Stable dissection flap is seen extending into the left subclavian artery. Normal cardiac size. No pericardial effusion. Mediastinum/Nodes: No enlarged mediastinal, hilar, or axillary lymph nodes. Thyroid gland, trachea, and esophagus demonstrate no significant findings. Lungs/Pleura: No pneumothorax or pleural effusion is noted. 6 mm nodule is noted inferiorly  in right middle lobe best seen on image number 114 of series 7. No consolidative process is noted. Musculoskeletal: No chest wall abnormality. No acute or significant osseous findings. Review of the MIP images confirms the above findings. CTA ABDOMEN AND PELVIS FINDINGS VASCULAR Aorta: Thoracic aortic dissection noted on prior exam extends throughout the abdominal aorta and into the common and external iliac arteries bilaterally no definite aneurysm is noted. Celiac: Patent without evidence of aneurysm, dissection, vasculitis or significant stenosis. SMA: Stable dissection is seen involving the proximal portion of the superior mesenteric artery which is unchanged compared to prior exam. Renals: Stable dissection is seen extending through the left renal artery. Stable dissection is seen involving the proximal portion of the right renal artery. IMA: Patent, but arises from the false lumen. Inflow: As noted above, dissection is seen to extend through the bilateral common and external iliac arteries and into left common femoral artery. This is stable compared to prior exam. Veins: No obvious venous abnormality within the limitations of this  arterial phase study. Review of the MIP images confirms the above findings. NON-VASCULAR Hepatobiliary: No focal liver abnormality is seen. No gallstones, gallbladder wall thickening, or biliary dilatation. Pancreas: Unremarkable. No pancreatic ductal dilatation or surrounding inflammatory changes. Spleen: Normal in size without focal abnormality. Adrenals/Urinary Tract: Adrenal glands are unremarkable. Kidneys are normal, without renal calculi, focal lesion, or hydronephrosis. Bladder is unremarkable. Stomach/Bowel: Stomach is within normal limits. Appendix appears normal. No evidence of bowel wall thickening, distention, or inflammatory changes. Diverticulosis of descending and sigmoid colon is noted without inflammation. Lymphatic: No adenopathy is noted. Reproductive: Prostate is unremarkable. Other: No abdominal wall hernia or abnormality. No abdominopelvic ascites. Musculoskeletal: No acute or significant osseous findings. Review of the MIP images confirms the above findings. IMPRESSION: Status post surgical repair of ascending thoracic aorta as well as mitral valve repair. Grossly stable appearance thoracic aortic dissection that includes the transverse aortic arch and descending thoracic aorta. It extends through the abdominal aorta and into bilateral common and external iliac arteries and left common femoral artery. Dissection flap is seen involving the left subclavian artery, superior mesenteric artery and both renal arteries. 6 mm nodule is noted in right middle lobe. Non-contrast chest CT at 6-12 months is recommended. If the nodule is stable at time of repeat CT, then future CT at 18-24 months (from today's scan) is considered optional for low-risk patients, but is recommended for high-risk patients. This recommendation follows the consensus statement: Guidelines for Management of Incidental Pulmonary Nodules Detected on CT Images: From the Fleischner Society 2017; Radiology 2017; 284:228-243.  Diverticulosis of descending and sigmoid colon without inflammation. Electronically Signed   By: Marijo Conception M.D.   On: 09/10/2020 14:10     Additional studies/ records that were reviewed today include:   R/L Heart Cath: 02/21/2020  Normal right heart pressures with PA mean pressure at 20 mmHg.   Normal coronary arteries.   LVEDP: 56mmHg   RECOMMENDATION: Mr. Darel Ricketts is an 80 year old gentleman who was recently found to have a cardiac murmur.  He has subsequently been found to have severe mitral regurgitation in the setting of normal LV function with an EF of 60 to 65%.  He is felt to have severe prolapse involving the portion of the posterior leaflet of the mitral valve with at least one ruptured primary chordae tendineae with a flail segment causing severe mitral regurgitation.  Presently, right heart pressures are essentially normal with borderline increased PA systolic pressure at  31 mm.  Coronary anatomy is normal.  He has undergone CT angiography of his vasculature ordered by Dr. Roxy Manns.  He will be following up with Dr. Roxy Manns and is tentatively scheduled for mitral valve repair surgery on March 11, 2020.   ECHO 05/07/2020 IMPRESSIONS   1. Left ventricular ejection fraction, by estimation, is 60 to 65%. The  left ventricle has normal function. The left ventricle has no regional  wall motion abnormalities. Left ventricular diastolic function could not  be evaluated.   2. Right ventricular systolic function is normal. The right ventricular  size is normal. There is normal pulmonary artery systolic pressure.   3. Left atrial size was moderately dilated.   4. Right atrial size was mildly dilated.   5. The mitral valve has been repaired/replaced. Trivial mitral valve  regurgitation. The mean mitral valve gradient is 4.0 mmHg. There is a 32  mm prosthetic annuloplasty ring present in the mitral position. Procedure  Date: 03/11/2020.   6. The aortic valve is tricuspid. There is  mild calcification of the  aortic valve. There is mild thickening of the aortic valve. Aortic valve  regurgitation is trivial.   7. The inferior vena cava is normal in size with greater than 50%  respiratory variability, suggesting right atrial pressure of 3 mmHg.    09/10/2020 FINDINGS: CTA CHEST FINDINGS Cardiovascular: Status post surgical repair of ascending thoracic aorta. Status post mitral valve repair. Stable appearance of residual dissection is seen that begins at the origin of the innominate artery and extends through the transverse aortic arch and descending thoracic aorta into the abdominal aorta. Stable dissection flap is seen extending into the left subclavian artery. Normal cardiac size. No pericardial effusion.   Mediastinum/Nodes: No enlarged mediastinal, hilar, or axillary lymph nodes. Thyroid gland, trachea, and esophagus demonstrate no significant findings.   Lungs/Pleura: No pneumothorax or pleural effusion is noted. 6 mm nodule is noted inferiorly in right middle lobe best seen on image number 114 of series 7. No consolidative process is noted.   Musculoskeletal: No chest wall abnormality. No acute or significant osseous findings.   Review of the MIP images confirms the above findings.   CTA ABDOMEN AND PELVIS FINDINGS   VASCULAR   Aorta: Thoracic aortic dissection noted on prior exam extends throughout the abdominal aorta and into the common and external iliac arteries bilaterally no definite aneurysm is noted.   Celiac: Patent without evidence of aneurysm, dissection, vasculitis or significant stenosis.   SMA: Stable dissection is seen involving the proximal portion of the superior mesenteric artery which is unchanged compared to prior exam.   Renals: Stable dissection is seen extending through the left renal artery. Stable dissection is seen involving the proximal portion of the right renal artery.   IMA: Patent, but arises from the false  lumen.   Inflow: As noted above, dissection is seen to extend through the bilateral common and external iliac arteries and into left common femoral artery. This is stable compared to prior exam.   Veins: No obvious venous abnormality within the limitations of this arterial phase study.   Review of the MIP images confirms the above findings.   NON-VASCULAR   Hepatobiliary: No focal liver abnormality is seen. No gallstones, gallbladder wall thickening, or biliary dilatation.   Pancreas: Unremarkable. No pancreatic ductal dilatation or surrounding inflammatory changes.   Spleen: Normal in size without focal abnormality.   Adrenals/Urinary Tract: Adrenal glands are unremarkable. Kidneys are normal, without renal calculi, focal lesion, or  hydronephrosis. Bladder is unremarkable.   Stomach/Bowel: Stomach is within normal limits. Appendix appears normal. No evidence of bowel wall thickening, distention, or inflammatory changes. Diverticulosis of descending and sigmoid colon is noted without inflammation.   Lymphatic: No adenopathy is noted.   Reproductive: Prostate is unremarkable.   Other: No abdominal wall hernia or abnormality. No abdominopelvic ascites.   Musculoskeletal: No acute or significant osseous findings.   Review of the MIP images confirms the above findings.   IMPRESSION: Status post surgical repair of ascending thoracic aorta as well as mitral valve repair.   Grossly stable appearance thoracic aortic dissection that includes the transverse aortic arch and descending thoracic aorta. It extends through the abdominal aorta and into bilateral common and external iliac arteries and left common femoral artery.   Dissection flap is seen involving the left subclavian artery, superior mesenteric artery and both renal arteries.   6 mm nodule is noted in right middle lobe. Non-contrast chest CT at 6-12 months is recommended. If the nodule is stable at time  of repeat CT, then future CT at 18-24 months (from today's scan) is considered optional for low-risk patients, but is recommended for high-risk patients. This recommendation follows the consensus statement: Guidelines for Management of Incidental Pulmonary Nodules Detected on CT Images: From the Fleischner Society 2017; Radiology 2017; 284:228-243.  ASSESSMENT:    1. Severe mitral regurgitation by echocardiogram prior to surgery   2. S/P mitral valve repair   3. S/P aortic dissection repair   4. Chronic thoracic and descending  aortic dissection ([place])   5. Obstructive sleep apnea (adult) (pediatric)   6. PAF (paroxysmal atrial fibrillation) ([place])   7. Anticoagulated on Coumadin   8. Anticoagulant disorder ([place])   9. Essential hypertension   10. Hyperlipidemia LDL goal <70   11. Hypothyroidism, unspecified type     PLAN:  Mr. Rashun Grattan is an 80 year old gentleman who has a history of hypertension, and was found to have severe mitral valve prolapse with a flail segment resulting in severe mitral regurgitation.  He had a remote history of sleep apnea which was very severe during supine sleep and had stopped using CPAP therapy over 3 years ago.  He was found to have normal coronary arteries and was admitted for elective mitral valve repair with complex valvuloplasty. His operation was complicated by acute type A aortic dissection necessitating graft repair of his aorta and resuspension of his native aortic valve.  He also had further complications with pericardial effusion requiring pericardial window, atrial fibrillation, anemia, AKA, and also was diagnosed with hypothyroidism.  An echo of May 07, 2020 showed an EF of 60 to 65%.  The left atrium was moderately dilated and the right atrium was mildly dilated.  There was trivial MR.  Subsequently, he is doing well regarding his complex valve surgery and complications.  When I saw him for my initial post hospital evaluation with me he was  having significant fatigability, was experiencing nocturia at least 4-5 times per night and was requiring at least 2 naps per day.  At that time I reviewed his sleep study from October 2021 revealed moderate overall sleep apnea but this was very severe during REM sleep with an AHI at 52.2/h.  Because of his cardiac surgery he never had CPAP titration.  I instituted AutoPap therapy his initial visit with me in February.  At follow-up in April 2022, I reviewed his most recent download increased his pressure settings since his 95th percentile pressure is 14.8  and maximum average pressure is 60.4.  I reduced his ramp time to 15 minutes with ramp start increasing to 6 cm of water and changed his auto mode to 11 up to 18 cm of water.  Most recent renal function is improved with creatinine now 1.19 compared to 1.64.  His CT of his aorta shows extensive chronic dissection through the arch, descending thoracic, abdominal aorta, bilateral iliac arteries and left common femoral artery.  There also was a dissection flap involving the bilateral subclavian arteries, celiac, SMA and bilateral renal arteries.  During his April 2022 evaluation his blood pressure was elevated and amlodipine 5 mg was added to his medical regimen for optimal blood pressure control and I further titrated metoprolol succinate to 50 mg.  Apparently, since I last saw him he completely discontinued CPAP use because he felt he was sleeping well without it and did have some mask leak.  However upon further questioning today he still is fatigued and cannot sleep on his back.  I have strongly recommended that we pursue reinitiation of therapy.  Unfortunately he is never been compliant.  He had his most recent study in October 2021 and is more than 6 months beyond this evaluation.  As result after much discussion he has agreed for reevaluation.  If we can schedule him for CPAP titration that would be optimal but if we need to perform a split-night study  initially this will then be performed and hopefully CPAP instituted during the same setting.  Discussed with him the fact that he has stopped CPAP therapy he has increased risk for recurrent atrial fibrillation.  He has been adjusting his own medications.  Apparently he completely stopped taking levothyroxine several weeks ago and over the past week completely stopped taking metoprolol succinate.  He has continued to take low-dose amlodipine 2.5 mg daily.  His resting pulse today is increased in the 90s up to 100 most likely due to discontinuance of metoprolol succinate.  I again have strongly urged that he not adjust his own medications.  With his extensive aortic dissection he is at significant risk for dissection progression off beta-blocker therapy.  I have resume metoprolol succinate 50 mg daily.  Target blood pressure ideally is less than 120/80.  I also have resumed levothyroxine 50 mcg daily.  He also typically has self adjusted his INR.  His INR today was subtherapeutic.  He was advised to take 10 mg of warfarin today, 7.5 mg for the next 2 days, and then resume his previous dose of 5 mg daily with the exception of Sunday where he takes 2.5 mg.  He will undergo repeat INR check on October 19, 2020.  Presently he continues to be on low-dose simvastatin.  I will see him in 4 months for follow-up evaluation.     Medication Adjustments/Labs and Tests Ordered: Current medicines are reviewed at length with the patient today.  Concerns regarding medicines are outlined above.  Medication changes, Labs and Tests ordered today are listed in the Patient Instructions below. Patient Instructions  Medication Instructions:  Resume Metoprolol. Resume Synthroid *If you need a refill on your cardiac medications before your next appointment, please call your pharmacy*   Lab Work:  No Labs If you have labs (blood work) drawn today and your tests are completely normal, you will receive your results only  by: [place] (if you have MyChart) OR A paper copy in the mail If you have any lab test that is abnormal or we need  to change your treatment, we will call you to review the results.   Testing/Procedures: [facility], [place] [employer] Your physician has recommended that you have a sleep study. This test records several body functions during sleep, including: brain activity, eye movement, oxygen and carbon dioxide blood levels, heart rate and rhythm, breathing rate and rhythm, the flow of air through your mouth and nose, snoring, body muscle movements, and chest and belly movement.    Follow-Up: At [facility], you and your health needs are our priority.  As part of our continuing mission to provide you with exceptional heart care, we have created designated Provider Care Teams.  These Care Teams include your primary Cardiologist (physician) and Advanced Practice Providers (APPs -  Physician Assistants and Nurse Practitioners) who all work together to provide you with the care you need, when you need it.  We recommend signing up for the patient portal called "MyChart".  Sign up information is provided on this After Visit Summary.  MyChart is used to connect with patients for Virtual Visits (Telemedicine).  Patients are able to view lab/test results, encounter notes, upcoming appointments, etc.  Non-urgent messages can be sent to your provider as well.   To learn more about what you can do with MyChart, go to [email or web address].    Your next appointment:   6 month(s)  The format for your next appointment:   In Person  Provider:   Shelva Majestic, MD    Signed, Shelva Majestic, MD  10/06/2020 6:38 PM    [facility] [address], [place], [place], [place]  [address] Phone: [phone]

## 2020-10-16 ENCOUNTER — Ambulatory Visit (INDEPENDENT_AMBULATORY_CARE_PROVIDER_SITE_OTHER): Payer: Medicare Other

## 2020-10-16 VITALS — Ht 70.0 in | Wt 181.0 lb

## 2020-10-16 DIAGNOSIS — Z0000 Encounter for general adult medical examination without abnormal findings: Secondary | ICD-10-CM

## 2020-10-16 NOTE — Patient Instructions (Signed)
[name] , Thank you for taking time to come for your Medicare Wellness Visit. I appreciate your ongoing commitment to your health goals. Please review the following plan we discussed and let me know if I can assist you in the future.   Screening recommendations/referrals: Colonoscopy: No longer required Recommended yearly ophthalmology/optometry visit for glaucoma screening and checkup Recommended yearly dental visit for hygiene and checkup  Vaccinations: Influenza vaccine: Done 01/06/2020 - Repeat annually  Pneumococcal vaccine: Done 01/14/2013 & 03/14/2014 Tdap vaccine: Done 07/06/2020 Shingles vaccine: Shingrix discussed. Please contact your pharmacy for coverage information.     Covid-19: Done 05/10/19, 06/07/19, & 04/14/2020  Advanced directives: Please bring a copy of your health care power of attorney and living will to the office to be added to your chart at your convenience.   Conditions/risks identified: Work on balance and strength exercises. Continue fall prevention measures. Aim for 30 minutes of exercise or brisk walking each day, drink 6-8 glasses of water and eat lots of fruits and vegetables.   Next appointment: Follow up in one year for your annual wellness visit.   Preventive Care 42 Years and Older, Male  Preventive care refers to lifestyle choices and visits with your health care provider that can promote health and wellness. What does preventive care include? A yearly physical exam. This is also called an annual well check. Dental exams once or twice a year. Routine eye exams. Ask your health care provider how often you should have your eyes checked. Personal lifestyle choices, including: Daily care of your teeth and gums. Regular physical activity. Eating a healthy diet. Avoiding tobacco and drug use. Limiting alcohol use. Practicing safe sex. Taking low doses of aspirin every day. Taking vitamin and mineral supplements as recommended by your health care  provider. What happens during an annual well check? The services and screenings done by your health care provider during your annual well check will depend on your age, overall health, lifestyle risk factors, and family history of disease. Counseling  Your health care provider may ask you questions about your: Alcohol use. Tobacco use. Drug use. Emotional well-being. Home and relationship well-being. Sexual activity. Eating habits. History of falls. Memory and ability to understand (cognition). Work and work Statistician. Screening  You may have the following tests or measurements: Height, weight, and BMI. Blood pressure. Lipid and cholesterol levels. These may be checked every 5 years, or more frequently if you are over 35 years old. Skin check. Lung cancer screening. You may have this screening every year starting at age 41 if you have a 30-pack-year history of smoking and currently smoke or have quit within the past 15 years. Fecal occult blood test (FOBT) of the stool. You may have this test every year starting at age 22. Flexible sigmoidoscopy or colonoscopy. You may have a sigmoidoscopy every 5 years or a colonoscopy every 10 years starting at age 20. Prostate cancer screening. Recommendations will vary depending on your family history and other risks. Hepatitis C blood test. Hepatitis B blood test. Sexually transmitted disease (STD) testing. Diabetes screening. This is done by checking your blood sugar (glucose) after you have not eaten for a while (fasting). You may have this done every 1-3 years. Abdominal aortic aneurysm (AAA) screening. You may need this if you are a current or former smoker. Osteoporosis. You may be screened starting at age 75 if you are at high risk. Talk with your health care provider about your test results, treatment options, and if necessary,  the need for more tests. Vaccines  Your health care provider may recommend certain vaccines, such  as: Influenza vaccine. This is recommended every year. Tetanus, diphtheria, and acellular pertussis (Tdap, Td) vaccine. You may need a Td booster every 10 years. Zoster vaccine. You may need this after age 6. Pneumococcal 13-valent conjugate (PCV13) vaccine. One dose is recommended after age 58. Pneumococcal polysaccharide (PPSV23) vaccine. One dose is recommended after age 62. Talk to your health care provider about which screenings and vaccines you need and how often you need them. This information is not intended to replace advice given to you by your health care provider. Make sure you discuss any questions you have with your health care provider. Document Released: 04/17/2015 Document Revised: 12/09/2015 Document Reviewed: 01/20/2015 Elsevier Interactive Patient Education  2017 [place] Prevention in the Home Falls can cause injuries. They can happen to people of all ages. There are many things you can do to make your home safe and to help prevent falls. What can I do on the outside of my home? Regularly fix the edges of walkways and driveways and fix any cracks. Remove anything that might make you trip as you walk through a door, such as a raised step or threshold. Trim any bushes or trees on the path to your home. Use bright outdoor lighting. Clear any walking paths of anything that might make someone trip, such as rocks or tools. Regularly check to see if handrails are loose or broken. Make sure that both sides of any steps have handrails. Any raised decks and porches should have guardrails on the edges. Have any leaves, snow, or ice cleared regularly. Use sand or salt on walking paths during winter. Clean up any spills in your garage right away. This includes oil or grease spills. What can I do in the bathroom? Use night lights. Install grab bars by the toilet and in the tub and shower. Do not use towel bars as grab bars. Use non-skid mats or decals in the tub or  shower. If you need to sit down in the shower, use a plastic, non-slip stool. Keep the floor dry. Clean up any water that spills on the floor as soon as it happens. Remove soap buildup in the tub or shower regularly. Attach bath mats securely with double-sided non-slip rug tape. Do not have throw rugs and other things on the floor that can make you trip. What can I do in the bedroom? Use night lights. Make sure that you have a light by your bed that is easy to reach. Do not use any sheets or blankets that are too big for your bed. They should not hang down onto the floor. Have a firm chair that has side arms. You can use this for support while you get dressed. Do not have throw rugs and other things on the floor that can make you trip. What can I do in the kitchen? Clean up any spills right away. Avoid walking on wet floors. Keep items that you use a lot in easy-to-reach places. If you need to reach something above you, use a strong step stool that has a grab bar. Keep electrical cords out of the way. Do not use floor polish or wax that makes floors slippery. If you must use wax, use non-skid floor wax. Do not have throw rugs and other things on the floor that can make you trip. What can I do with my stairs? Do not leave any items on  the stairs. Make sure that there are handrails on both sides of the stairs and use them. Fix handrails that are broken or loose. Make sure that handrails are as long as the stairways. Check any carpeting to make sure that it is firmly attached to the stairs. Fix any carpet that is loose or worn. Avoid having throw rugs at the top or bottom of the stairs. If you do have throw rugs, attach them to the floor with carpet tape. Make sure that you have a light switch at the top of the stairs and the bottom of the stairs. If you do not have them, ask someone to add them for you. What else can I do to help prevent falls? Wear shoes that: Do not have high heels. Have  rubber bottoms. Are comfortable and fit you well. Are closed at the toe. Do not wear sandals. If you use a stepladder: Make sure that it is fully opened. Do not climb a closed stepladder. Make sure that both sides of the stepladder are locked into place. Ask someone to hold it for you, if possible. Clearly mark and make sure that you can see: Any grab bars or handrails. First and last steps. Where the edge of each step is. Use tools that help you move around (mobility aids) if they are needed. These include: Canes. Walkers. Scooters. Crutches. Turn on the lights when you go into a dark area. Replace any light bulbs as soon as they burn out. Set up your furniture so you have a clear path. Avoid moving your furniture around. If any of your floors are uneven, fix them. If there are any pets around you, be aware of where they are. Review your medicines with your doctor. Some medicines can make you feel dizzy. This can increase your chance of falling. Ask your doctor what other things that you can do to help prevent falls. This information is not intended to replace advice given to you by your health care provider. Make sure you discuss any questions you have with your health care provider. Document Released: 01/15/2009 Document Revised: 08/27/2015 Document Reviewed: 04/25/2014 Elsevier Interactive Patient Education  2017 [employer].

## 2020-10-16 NOTE — Progress Notes (Signed)
Subjective:   Patrick Bell is a 80 y.o. male who presents for Medicare Annual/Subsequent preventive examination.  Virtual Visit via Telephone Note  I connected with  Patrick Bell on 10/16/20 at 11:15 AM EDT by telephone and verified that I am speaking with the correct person using two identifiers.  Location: Patient: Home Provider: [facility] Persons participating in the virtual visit: patient and his wife, Microbiologist   I discussed the limitations, risks, security and privacy concerns of performing an evaluation and management service by telephone and the availability of in person appointments. The patient expressed understanding and agreed to proceed.  Interactive audio and video telecommunications were attempted between this nurse and patient, however failed, due to patient having technical difficulties OR patient did not have access to video capability.  We continued and completed visit with audio only.  Some vital signs may be absent or patient reported.   Bradleigh Sonnen E Fey Coghill, LPN   Review of Systems     Cardiac Risk Factors include: advanced age (>8men, >77 women);male gender;sedentary lifestyle;dyslipidemia;hypertension;Other (see comment), Risk factor comments: Atrial Fibrillation, atherosclerosis     Objective:    Today's Vitals   10/16/20 1039  Weight: 181 lb (82.1 kg)  Height: 5\' 10"  (1.778 m)   Body mass index is 25.97 kg/m.  Advanced Directives 10/16/2020 03/31/2020 03/13/2020 03/09/2020 02/21/2020 12/19/2019 01/29/2019  Does Patient Have a Medical Advance Directive? No No No No No No No  Type of Advance Directive - - - - - - -  Does patient want to make changes to medical advance directive? - - - - - - -  Would patient like information on creating a medical advance directive? No - Patient declined No - Patient declined No - Patient declined Yes (MAU/Ambulatory/Procedural Areas - Information given) No - Patient declined Yes (Inpatient - patient defers  creating a medical advance directive at this time - Information given) No - Patient declined    Current Medications (verified) Outpatient Encounter Medications as of 10/16/2020  Medication Sig   acetaminophen (TYLENOL) 325 MG tablet Take 1-2 tablets (325-650 mg total) by mouth every 4 (four) hours as needed for mild pain.   albuterol (PROAIR HFA) 108 (90 Base) MCG/ACT inhaler Inhale 2 puffs into the lungs every 6 (six) hours as needed for wheezing.   amLODipine (NORVASC) 2.5 MG tablet Take 1 tablet (2.5 mg total) by mouth daily.   doxycycline (VIBRA-TABS) 100 MG tablet Take 1 tablet (100 mg total) by mouth 2 (two) times daily. 1 po bid   levothyroxine (SYNTHROID) 50 MCG tablet Take 1 tablet (50 mcg total) by mouth daily.   metoprolol succinate (TOPROL-XL) 50 MG 24 hr tablet Take 1 tablet (50 mg total) by mouth daily. Take with or immediately following a meal.   montelukast (SINGULAIR) 10 MG tablet Take 1 tablet (10 mg total) daily by mouth.   Multiple Vitamin (MULTIVITAMIN WITH MINERALS) TABS tablet Take 1 tablet by mouth daily.   Olopatadine HCl 0.2 % SOLN Place 1 drop into both eyes daily as needed (allergies).   pantoprazole (PROTONIX) 40 MG tablet Take 1 tablet (40 mg total) daily by mouth.   simvastatin (ZOCOR) 10 MG tablet Take 1 tablet (10 mg total) by mouth at bedtime.   warfarin (COUMADIN) 5 MG tablet Take 1 tablet daily at 4 EVENING.   No facility-administered encounter medications on file as of 10/16/2020.    Allergies (verified) Patient has no known allergies.   History: Past Medical History:  Diagnosis Date   Acute thoracic aortic dissection (HCC) 03/11/2020   Type A   Allergy    Asthma    Cataract    Cataract    Diverticulitis    Dyspnea    GERD (gastroesophageal reflux disease)    Heart murmur 12/2019   Hyperlipidemia    Hypertension    Inguinal hernia bilateral, non-recurrent    S/P aortic dissection repair 03/11/2020   supracoronary straight graft repair with  resuspension of native aortic valve and open distal anastomosis for intraoperative acute type A aortic dissection    S/P mitral valve repair 03/11/2020   Complex valvuloplasty including artificial Gore-tex neochord placement x 4, suture plication of posterior leaflet and posteromedial commissure and 32 mm Sorin Memo 4D ring annuloplasty   Sleep apnea    Past Surgical History:  Procedure Laterality Date   bilateral inguinal hernia     CARDIAC CATHETERIZATION Bilateral 02/21/2020   CHEST TUBE INSERTION N/A 03/22/2020   Procedure: CHEST TUBE INSERTION OF 28 BLAKE DRAIN.;  Surgeon: Grace Isaac, MD;  Location: [place] [facility];  Service: Thoracic;  Laterality: N/A;   COLONOSCOPY  06/09/2004   ZTI:[id number] colon diverticulosis, otherwise normal colonoscopy   EYE SURGERY  08/2014   HERNIA REPAIR     MITRAL VALVE REPAIR N/A 03/11/2020   Procedure: MITRAL VALVE REPAIR (MVR) USING MEMO 4D SIZE 32 RING;  Surgeon: Rexene Alberts, MD;  Location: [place];  Service: Open Heart Surgery;  Laterality: N/A;   PALATE / UVULA BIOPSY / EXCISION     PERICARDIAL WINDOW N/A 03/22/2020   Procedure: SUBXYPHOID POST-OP PERICARDIAL FLUID DRAINAGE WITH CHEST TUBE INSERTION.;  Surgeon: Grace Isaac, MD;  Location: [place];  Service: Thoracic;  Laterality: N/A;   REPAIR OF ACUTE ASCENDING THORACIC AORTIC DISSECTION N/A 03/11/2020   Procedure: REPAIR OF ACUTE ASCENDING THORACIC AORTIC DISSECTION USING A HEMASHIELD PLATINUM 26MM STRAIGHT GRAFT AND A HEASHIELD PLATINUM 28X10MM SINGLE ARM GRAFT;  Surgeon: Rexene Alberts, MD;  Location: [place];  Service: Open Heart Surgery;  Laterality: N/A;   RIGHT/LEFT HEART CATH AND CORONARY ANGIOGRAPHY N/A 02/21/2020   Procedure: RIGHT/LEFT HEART CATH AND CORONARY ANGIOGRAPHY;  Surgeon: Troy Sine, MD;  Location: [place] [facility];  Service: Cardiovascular;  Laterality: N/A;   TEE WITHOUT CARDIOVERSION N/A 12/19/2019   Procedure: TRANSESOPHAGEAL ECHOCARDIOGRAM (TEE);  Surgeon:  Buford Dresser, MD;  Location: [facility] ENDOSCOPY;  Service: Cardiovascular;  Laterality: N/A;   TEE WITHOUT CARDIOVERSION N/A 03/11/2020   Procedure: TRANSESOPHAGEAL ECHOCARDIOGRAM (TEE);  Surgeon: Rexene Alberts, MD;  Location: [place];  Service: Open Heart Surgery;  Laterality: N/A;   TEE WITHOUT CARDIOVERSION N/A 03/22/2020   Procedure: TRANSESOPHAGEAL ECHOCARDIOGRAM (TEE);  Surgeon: Grace Isaac, MD;  Location: [facility] OR;  Service: Thoracic;  Laterality: N/A;   TONSILLECTOMY     VASECTOMY     Family History  Problem Relation Age of Onset   Heart disease Mother    Hip fracture Mother 29   Osteoporosis Mother    Dementia Mother 43   Arthritis Father    Heart disease Father    Arthritis Sister    Arthritis Brother    Colon cancer Neg Hx    Social History   Socioeconomic History   Marital status: Married    Spouse name: Wells Guiles   Number of children: 2   Years of education: 14   Highest education level: Some college, no degree  Occupational History   Occupation: retired    Fish farm manager: [employer]    Comment: truck driving  Tobacco Use   Smoking status: Former    Packs/day: 4.00    Years: 25.00    Pack years: 100.00    Types: Cigarettes    Quit date: 06/23/1982    Years since quitting: 38.3   Smokeless tobacco: Never   Tobacco comments:    pt was a truck Land Use: Never used  Substance and Sexual Activity   Alcohol use: Yes    Alcohol/week: 5.0 standard drinks    Types: 5 Shots of liquor per week    Comment: brandy a few times a month, beer once a month   Drug use: No   Sexual activity: Yes    Birth control/protection: Surgical  Other Topics Concern   Not on file  Social History Narrative   Not on file   Social Determinants of Health   Financial Resource Strain: Not on file  Food Insecurity: Not on file  Transportation Needs: Not on file  Physical Activity: Insufficiently Active   Days of Exercise per Week: 4 days   Minutes of  Exercise per Session: 30 min  Stress: No Stress Concern Present   Feeling of Stress : Not at all  Social Connections: Not on file    Tobacco Counseling Counseling given: Not Answered 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 No  Interpreter Needed?: No  Information entered by :: Xaivier Malay, LPN   Activities of Daily Living In your present state of health, do you have any difficulty performing the following activities: 10/16/2020  Hearing? Y  Vision? N  Difficulty concentrating or making decisions? N  Walking or climbing stairs? N  Dressing or bathing? N  Doing errands, shopping? Y  Comment not cleared to drive yet from cardiologist  Preparing Food and eating ? N  Using the Toilet? N  In the past six months, have you accidently leaked urine? Y  Comment urgency - has leakage once a week  Do you have problems with loss of bowel control? N  Managing your Medications? N  Managing your Finances? N  Housekeeping or managing your Housekeeping? N  Some recent data might be hidden    Patient Care Team: Dettinger, Fransisca Kaufmann, MD as PCP - General (Family Medicine) Troy Sine, MD as PCP - Cardiology (Cardiology) Danie Binder, MD (Inactive) as Attending Physician (Gastroenterology) Ralene Muskrat as Physician Assistant (Chiropractic Medicine) Druscilla Brownie, MD as Consulting Physician (Dermatology)  Indicate any recent Medical Services you may have received from other than [facility] providers in the past year (date may be approximate).     Assessment:   This is a routine wellness examination for  Patrick Bell.  Hearing/Vision screen Hearing Screening - Comments:: Wears hearing aids from [place]:: Wears eyeglasses - up to date with annual eye exams with Dr August Albino in [place]  Dietary issues and exercise activities discussed: Current Exercise Habits: Home exercise routine, Type of exercise: walking, Time (Minutes): 30, Frequency (Times/Week): 4, Weekly Exercise (Minutes/Week): 120, Intensity: Mild, Exercise limited by: cardiac condition(s)   Goals Addressed  This Visit's Progress    Exercise 150 minutes per week (moderate activity)   Not on track      Depression Screen PHQ 2/9 Scores 10/16/2020 09/15/2020 09/15/2020 08/07/2020 07/06/2020 06/11/2020 06/02/2020  PHQ - 2 Score 0 0 0 0 0 0 0  PHQ- 9 Score 5 - - - - 0 2    Fall Risk Fall Risk  10/16/2020 09/15/2020 09/15/2020 07/06/2020 06/11/2020  Falls in the past year? 1 0 0 0 0  Comment - - - - -  Number falls in past yr: 1 - - - 0  Injury with Fall? 0 - - - 0  Comment - - - - -  Risk for fall due to : Impaired balance/gait;History of fall(s);Impaired vision;Orthopedic patient - - - Impaired balance/gait  Follow up Education provided;Falls prevention discussed - - - Falls evaluation completed  Comment - - - - -    FALL RISK PREVENTION PERTAINING TO THE HOME:  Any stairs in or around the home? Yes  If so, are there any without handrails? No  Home free of loose throw rugs in walkways, pet beds, electrical cords, etc? Yes  Adequate lighting in your home to reduce risk of falls? Yes   ASSISTIVE DEVICES UTILIZED TO PREVENT FALLS:  Life alert? No  Use of a cane, walker or w/c? Yes  Grab bars in the bathroom? Yes  Shower chair or bench in shower? No  Elevated toilet seat or a handicapped toilet? No   TIMED UP AND GO:  Was the test performed? No . Telephonic visit.  Cognitive Function: MMSE - Mini Mental State Exam 01/23/2018 01/18/2017 01/19/2016 03/11/2015  Orientation to time 5 5 5  5   Orientation to Place 5 5 5 5   Registration 3 3 3 3   Attention/ Calculation 5 3 5 5   Recall 2 3 3 2   Language- name 2 objects 2 2 2 2   Language- repeat 1 1 1 1   Language- follow 3 step command 3 3 3 3   Language- read & follow direction 1 1 1 1   Write a sentence 1 1 1 1   Copy design 1 1 1 1   Total score 29 28 30 29      6CIT Screen 10/16/2020 01/29/2019  What Year? 0 points 0 points  What month? 0 points 0 points  What time? 0 points 0 points  Count back from 20 0 points 0 points  Months in reverse 0 points 0 points  Repeat phrase 2 points 0 points  Total Score 2 0    Immunizations Immunization History  Administered Date(s) Administered   Fluad Quad(high Dose 65+) 02/13/2019, 01/06/2020   Influenza, High Dose Seasonal PF 01/23/2018   Influenza,inj,Quad PF,6+ Mos 01/14/2013, 03/14/2014, 03/11/2015, 01/19/2016, 01/18/2017   Moderna Sars-Covid-2 Vaccination 05/10/2019, 06/07/2019   Pneumococcal Conjugate-13 03/14/2014   Pneumococcal Polysaccharide-23 01/14/2013   Tdap 07/06/2020   Zoster, Live 06/14/2011    TDAP status: Up to date  Flu Vaccine status: Up to date  Pneumococcal vaccine status: Up to date  Covid-19 vaccine status: Completed vaccines  Qualifies for Shingles Vaccine? Yes   Zostavax completed Yes   Shingrix Completed?: No.    Education has been provided regarding the importance of this vaccine. Patient has been advised to call insurance company to determine out of pocket expense if they have not yet received this vaccine. Advised may also receive vaccine at local pharmacy or Health Dept. Verbalized acceptance and understanding.  Screening Tests Health Maintenance  Topic Date Due  Zoster Vaccines- Shingrix (1 of 2) Never done   COVID-19 Vaccine (3 - Moderna risk series) 07/12/2021 (Originally 07/05/2019)   INFLUENZA VACCINE  11/02/2020   TETANUS/TDAP  07/07/2030   PNA vac Low Risk Adult  Completed   HPV VACCINES  Aged Out    Health  Maintenance  Health Maintenance Due  Topic Date Due   Zoster Vaccines- Shingrix (1 of 2) Never done    Colorectal cancer screening: No longer required.   Lung Cancer Screening: (Low Dose CT Chest recommended if Age 68-80 years, 30 pack-year currently smoking OR have quit w/in 15years.) does not qualify.   Additional Screening:  Hepatitis C Screening: does not qualify  Vision Screening: Recommended annual ophthalmology exams for early detection of glaucoma and other disorders of the eye. Is the patient up to date with their annual eye exam?  Yes  Who is the provider or what is the name of the office in which the patient attends annual eye exams? [facility] If pt is not established with a provider, would they like to be referred to a provider to establish care? No .   Dental Screening: Recommended annual dental exams for proper oral hygiene  Community Resource Referral / Chronic Care Management: CRR required this visit?  No   CCM required this visit?  No      Plan:     I have personally reviewed and noted the following in the patient's chart:   Medical and social history Use of alcohol, tobacco or illicit drugs  Current medications and supplements including opioid prescriptions. Patient is not currently taking opioid prescriptions. Functional ability and status Nutritional status Physical activity Advanced directives List of other physicians Hospitalizations, surgeries, and ER visits in previous 12 months Vitals Screenings to include cognitive, depression, and falls Referrals and appointments  In addition, I have reviewed and discussed with patient certain preventive protocols, quality metrics, and best practice recommendations. A written personalized care plan for preventive services as well as general preventive health recommendations were provided to patient.     Sandrea Hammond, LPN   0/21/1155   Nurse Notes: None

## 2020-10-19 ENCOUNTER — Ambulatory Visit (INDEPENDENT_AMBULATORY_CARE_PROVIDER_SITE_OTHER): Payer: Medicare Other

## 2020-10-19 ENCOUNTER — Other Ambulatory Visit: Payer: Self-pay

## 2020-10-19 DIAGNOSIS — I7101 Dissection of thoracic aorta: Secondary | ICD-10-CM

## 2020-10-19 DIAGNOSIS — Z9889 Other specified postprocedural states: Secondary | ICD-10-CM

## 2020-10-19 DIAGNOSIS — Z5181 Encounter for therapeutic drug level monitoring: Secondary | ICD-10-CM | POA: Diagnosis not present

## 2020-10-19 DIAGNOSIS — I71019 Dissection of thoracic aorta, unspecified: Secondary | ICD-10-CM

## 2020-10-19 LAB — POCT INR: INR: 2.5 (ref 2.0–3.0)

## 2020-10-19 NOTE — Patient Instructions (Signed)
Continue taking 1 tablet daily except 1/2 tablet each Sunday.  Repeat INR in 6 weeks

## 2020-11-10 ENCOUNTER — Other Ambulatory Visit: Payer: Self-pay

## 2020-11-10 ENCOUNTER — Encounter: Payer: Self-pay | Admitting: Nurse Practitioner

## 2020-11-10 ENCOUNTER — Ambulatory Visit (INDEPENDENT_AMBULATORY_CARE_PROVIDER_SITE_OTHER): Payer: Medicare Other | Admitting: Nurse Practitioner

## 2020-11-10 VITALS — BP 124/68 | HR 86 | Temp 97.0°F | Ht 70.0 in | Wt 176.0 lb

## 2020-11-10 DIAGNOSIS — T300 Burn of unspecified body region, unspecified degree: Secondary | ICD-10-CM | POA: Insufficient documentation

## 2020-11-10 DIAGNOSIS — T22131A Burn of first degree of right upper arm, initial encounter: Secondary | ICD-10-CM | POA: Diagnosis not present

## 2020-11-10 DIAGNOSIS — T22132A Burn of first degree of left upper arm, initial encounter: Secondary | ICD-10-CM | POA: Diagnosis not present

## 2020-11-10 MED ORDER — HYDROCORTISONE 1 % EX LOTN: 1.0000 "application " | TOPICAL_LOTION | Freq: Two times a day (BID) | CUTANEOUS | 0 refills | Status: DC

## 2020-11-10 NOTE — Patient Instructions (Signed)
Burn Care, Adult A burn is an injury to the skin or the tissues under the skin. There are three types of burns: First degree. These burns may cause the skin to be red and a bit swollen. Second degree. These burns are very painful and cause the skin to be very red. The skin may also swell, leak fluid, look shiny, and start to have blisters. Third degree. These burns cause lasting damage. They turn the skin white or black and make it look charred, dry, and leathery. Treatment for your burn will depend on the type of burn you have. Taking good care of your burn can help to prevent pain and infection. It can also help theburn heal quickly. How to care for a first-degree burn Right after a burn: Rinse or soak the burn under cool water for 5 minutes or more. Do not put ice on your burn. That can cause more damage. Put a cool, clean, wet cloth on your burn. Put lotion or gel with aloe vera on your burn. Caring for the burn Clean and care for your burn. Your doctor may tell you: To clean the burn using soap and water. To pat the burn dry using a clean cloth. Do not rub or scrub the burn. To put lotion or gel with aloe vera on your burn. How to care for a second-degree burn Right after a burn: Rinse or soak the burn under cool water. Do this for 5 to 10 minutes. Do not put ice on your burn. This can cause more damage. Remove any jewelry near the burned area. Cover the burn with a clean cloth. Caring for the burn Raise (elevate) the burned area above the level of your heart while sitting or lying down. Clean and care for your burn. Your doctor may tell you: To clean or rinse your burn. To put a cream or ointment on the burn. To place a germ-free (sterile) dressing over the burn. A dressing is a material that is placed on a burn to help it heal. How to care for a third-degree burn Right after a burn: Cover the burn with a clean, dry cloth. Seek treatment right away if you have this kind of burn.  You may: Need to stay in the hospital. Have surgery to remove burned tissue. Have surgery to put new skin on the burned area. Be given fluids through an IV tube. Caring for the burn Clean and care for your burn. Your doctor may tell you: To clean or rinse your burn. To put a cream or ointment on the burn. To put a sterile dressing in the burn. This is called packing. To place a sterile dressing over the burn. Other things to do Raise the burned area above the level of your heart while sitting or lying down. Wear splints or immobilizers if told by your doctor. Rest as told by your doctor. Do not do sports or other activities until your doctor approves. How to prevent infection when caring for a burn  Take these steps to prevent infection: Wash your hands with soap and water for at least 20 seconds before and after caring for your burn. If you cannot use soap and water, use hand sanitizer. Wear clean or sterile gloves as told by your doctor. Do not put butter, oil, toothpaste, or other home remedies on the burn. Do not scratch or pick at the burn. Do not break any blisters. Do not peel the skin. Do not rub your burn, even when you  are cleaning it. Check your burn every day for these signs of infection: More redness, swelling, or pain. Warmth. Pus or a bad smell. Red streaks around the burn. Follow these instructions at home Medicines Take over-the-counter and prescription medicines only as told by your doctor. If you were prescribed an antibiotic medicine, use it as told by your doctor. Do not stop using the antibiotic even if your condition gets better. Your doctor may ask you to take medicine for pain before you change your dressing. General instructions  Protect your burn from the sun. Drink enough fluid to keep your pee (urine) pale yellow. Do not use any products that contain nicotine or tobacco, such as cigarettes, e-cigarettes, and chewing tobacco. These can delay healing.  If you need help quitting, ask your doctor. Keep all follow-up visits as told by your doctor. This is important.  Contact a doctor if: Your condition does not get better. Your condition gets worse. You have a fever or chills. Your burn feels warm to the touch. You have more redness, swelling, or pain on your burn. Your burn looks different or starts to have black or red spots on it. Your pain does not get better with medicine. Get help right away if: You have more fluid, blood, or pus coming from your burn. You have red streaks near the burn. You have very bad pain. Summary There are three types of burns. They are first degree, second degree, and third degree. Of these, a third-degree burn is most serious. This must be treated right away. Treatment for your burn will depend on the type of burn you have. Do not put butter, oil, toothpaste, or other home remedies on the burn. These things can damage your skin. Follow instructions from your doctor about how to clean and take care of your burn. This information is not intended to replace advice given to you by your health care provider. Make sure you discuss any questions you have with your healthcare provider. Document Revised: 05/10/2019 Document Reviewed: 01/08/2019 Elsevier Patient Education  [place].
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
## 2020-11-30 ENCOUNTER — Encounter: Payer: Self-pay | Admitting: Family Medicine

## 2020-11-30 ENCOUNTER — Ambulatory Visit (INDEPENDENT_AMBULATORY_CARE_PROVIDER_SITE_OTHER): Payer: Medicare Other | Admitting: Family Medicine

## 2020-11-30 DIAGNOSIS — R509 Fever, unspecified: Secondary | ICD-10-CM | POA: Diagnosis not present

## 2020-11-30 DIAGNOSIS — R058 Other specified cough: Secondary | ICD-10-CM | POA: Diagnosis not present

## 2020-11-30 DIAGNOSIS — U071 COVID-19: Secondary | ICD-10-CM | POA: Diagnosis not present

## 2020-11-30 MED ORDER — MOLNUPIRAVIR EUA 200MG CAPSULE: 4.0000 | ORAL_CAPSULE | Freq: Two times a day (BID) | ORAL | 0 refills | Status: AC

## 2020-11-30 NOTE — Progress Notes (Signed)
Virtual Visit via telephone Note Due to COVID-19 pandemic this visit was conducted virtually. This visit type was conducted due to national recommendations for restrictions regarding the COVID-19 Pandemic (e.g. social distancing, sheltering in place) in an effort to limit this patient's exposure and mitigate transmission in our community. All issues noted in this document were discussed and addressed.  A physical exam was not performed with this format.   I connected with Patrick Bell on 11/30/2020 at 1133 by telephone and verified that I am speaking with the correct person using two identifiers. Patrick Bell is currently located at home and family is currently with them during visit. The provider, Monia Pouch, FNP is located in their office at time of visit.  I discussed the limitations, risks, security and privacy concerns of performing an evaluation and management service by telephone and the availability of in person appointments. I also discussed with the patient that there may be a patient responsible charge related to this service. The patient expressed understanding and agreed to proceed.  Subjective:  Patient ID: Patrick Bell, male    DOB: 12-25-1940, 80 y.o.   MRN: [id number]  Chief Complaint:  Covid Positive   HPI: Patrick Bell is a 80 y.o. male presenting on 11/30/2020 for Covid Positive   Pt reports two days of cough, congestion, low grade fever, and chills. He went to the pharmacy today and tested positive for COVID-19. He has been taking Mucinex and tylenol without complete relief of symptoms. He is high risk due to significant cardiovascular disease and age.     Relevant past medical, surgical, family, and social history reviewed and updated as indicated.  Allergies and medications reviewed and updated.   Past Medical History:  Diagnosis Date   Acute thoracic aortic dissection (HCC) 03/11/2020   Type A   Allergy    Asthma    Cataract    Cataract     Diverticulitis    Dyspnea    GERD (gastroesophageal reflux disease)    Heart murmur 12/2019   Hyperlipidemia    Hypertension    Inguinal hernia bilateral, non-recurrent    S/P aortic dissection repair 03/11/2020   supracoronary straight graft repair with resuspension of native aortic valve and open distal anastomosis for intraoperative acute type A aortic dissection    S/P mitral valve repair 03/11/2020   Complex valvuloplasty including artificial Gore-tex neochord placement x 4, suture plication of posterior leaflet and posteromedial commissure and 32 mm Sorin Memo 4D ring annuloplasty   Sleep apnea     Past Surgical History:  Procedure Laterality Date   bilateral inguinal hernia     CARDIAC CATHETERIZATION Bilateral 02/21/2020   CHEST TUBE INSERTION N/A 03/22/2020   Procedure: CHEST TUBE INSERTION OF 28 BLAKE DRAIN.;  Surgeon: Grace Isaac, MD;  Location: [facility];  Service: Thoracic;  Laterality: N/A;   COLONOSCOPY  06/09/2004   [id number] colon diverticulosis, otherwise normal colonoscopy   EYE SURGERY  08/2014   HERNIA REPAIR     MITRAL VALVE REPAIR N/A 03/11/2020   Procedure: MITRAL VALVE REPAIR (MVR) USING MEMO 4D SIZE 32 RING;  Surgeon: Rexene Alberts, MD;  Location: [place];  Service: Open Heart Surgery;  Laterality: N/A;   PALATE / UVULA BIOPSY / EXCISION     PERICARDIAL WINDOW N/A 03/22/2020   Procedure: SUBXYPHOID POST-OP PERICARDIAL FLUID DRAINAGE WITH CHEST TUBE INSERTION.;  Surgeon: Grace Isaac, MD;  Location: [place];  Service: Thoracic;  Laterality: N/A;  REPAIR OF ACUTE ASCENDING THORACIC AORTIC DISSECTION N/A 03/11/2020   Procedure: REPAIR OF ACUTE ASCENDING THORACIC AORTIC DISSECTION USING A HEMASHIELD PLATINUM 26MM STRAIGHT GRAFT AND A HEASHIELD PLATINUM 28X10MM SINGLE ARM GRAFT;  Surgeon: Rexene Alberts, MD;  Location: [place];  Service: Open Heart Surgery;  Laterality: N/A;   RIGHT/LEFT HEART CATH AND CORONARY ANGIOGRAPHY N/A 02/21/2020   Procedure:  RIGHT/LEFT HEART CATH AND CORONARY ANGIOGRAPHY;  Surgeon: Troy Sine, MD;  Location: [place] [facility];  Service: Cardiovascular;  Laterality: N/A;   TEE WITHOUT CARDIOVERSION N/A 12/19/2019   Procedure: TRANSESOPHAGEAL ECHOCARDIOGRAM (TEE);  Surgeon: Buford Dresser, MD;  Location[facility];  Service: Cardiovascular;  Laterality: N/A;   TEE WITHOUT CARDIOVERSION N/A 03/11/2020   Procedure: TRANSESOPHAGEAL ECHOCARDIOGRAM (TEE);  Surgeon: Rexene Alberts, MD;  Location: [place];  Service: Open Heart Surgery;  Laterality: N/A;   TEE WITHOUT CARDIOVERSION N/A 03/22/2020   Procedure: TRANSESOPHAGEAL ECHOCARDIOGRAM (TEE);  Surgeon: Grace Isaac, MD;  [facility];  Service: Thoracic;  Laterality: N/A;   TONSILLECTOMY     VASECTOMY      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  Tobacco Use   Smoking status: Former    Packs/day: 4.00    Years: 25.00    Pack years: 100.00    Types: Cigarettes    Quit date: 06/23/1982    Years since quitting: 38.4   Smokeless tobacco: Never   Tobacco comments:    pt was a truck Land Use: Never used  Substance and Sexual Activity   Alcohol use: Yes    Alcohol/week: 5.0 standard drinks    Types: 5 Shots of liquor per week    Comment: brandy a few times a month, beer once a month   Drug use: No   Sexual activity: Yes    Birth control/protection: Surgical  Other Topics Concern   Not on file  Social History Narrative   Not on file   Social Determinants of Health   Financial Resource Strain: Not on file  Food Insecurity: Not on file  Transportation Needs: Not on file  Physical Activity: Insufficiently Active   Days of Exercise per Week: 4 days   Minutes of  Exercise per Session: 30 min  Stress: No Stress Concern Present   Feeling of Stress : Not at all  Social Connections: Not on file  Intimate Partner Violence: Not on file    Outpatient Encounter Medications as of 11/30/2020  Medication Sig   molnupiravir EUA 200 mg CAPS Take 4 capsules (800 mg total) by mouth 2 (two) times daily for 5 days.   acetaminophen (TYLENOL) 325 MG tablet Take 1-2 tablets (325-650 mg total) by mouth every 4 (four) hours as needed for mild pain.   albuterol (PROAIR HFA) 108 (90 Base) MCG/ACT inhaler Inhale 2 puffs into the lungs every 6 (six) hours as needed for wheezing.   amLODipine (NORVASC) 2.5 MG tablet Take 1 tablet (2.5 mg total) by mouth daily.   doxycycline (VIBRA-TABS) 100 MG tablet Take 1 tablet (100 mg total) by mouth 2 (two) times daily. 1 po bid   hydrocortisone 1 % lotion Apply 1 application topically 2 (  two) times daily.   levothyroxine (SYNTHROID) 50 MCG tablet Take 1 tablet (50 mcg total) by mouth daily.   metoprolol succinate (TOPROL-XL) 50 MG 24 hr tablet Take 1 tablet (50 mg total) by mouth daily. Take with or immediately following a meal.   montelukast (SINGULAIR) 10 MG tablet Take 1 tablet (10 mg total) daily by mouth.   Multiple Vitamin (MULTIVITAMIN WITH MINERALS) TABS tablet Take 1 tablet by mouth daily.   Olopatadine HCl 0.2 % SOLN Place 1 drop into both eyes daily as needed (allergies).   pantoprazole (PROTONIX) 40 MG tablet Take 1 tablet (40 mg total) daily by mouth.   simvastatin (ZOCOR) 10 MG tablet Take 1 tablet (10 mg total) by mouth at bedtime.   warfarin (COUMADIN) 5 MG tablet Take 1 tablet daily at 4 EVENING.   No facility-administered encounter medications on file as of 11/30/2020.    No Known Allergies  Review of Systems  Constitutional:  Positive for fatigue and fever. Negative for activity change, appetite change, chills, diaphoresis and unexpected weight change.  HENT:  Positive for congestion, postnasal drip, rhinorrhea and  sore throat. Negative for dental problem, drooling, ear discharge, ear pain, facial swelling, hearing loss, mouth sores, nosebleeds, sinus pressure, sinus pain, sneezing, tinnitus, trouble swallowing and voice change.   Eyes: Negative.   Respiratory:  Positive for cough. Negative for choking, chest tightness, shortness of breath, wheezing and stridor.   Cardiovascular:  Negative for chest pain, palpitations and leg swelling.  Gastrointestinal:  Negative for abdominal pain, blood in stool, constipation, diarrhea, nausea and vomiting.  Endocrine: Negative.   Genitourinary:  Negative for decreased urine volume, difficulty urinating, dysuria, frequency and urgency.  Musculoskeletal:  Negative for arthralgias and myalgias.  Skin: Negative.   Allergic/Immunologic: Negative.   Neurological:  Positive for headaches. Negative for dizziness, tremors, seizures, syncope, facial asymmetry, speech difficulty, weakness, light-headedness and numbness.  Hematological: Negative.   Psychiatric/Behavioral:  Negative for confusion, hallucinations, sleep disturbance and suicidal ideas.   All other systems reviewed and are negative.       Observations/Objective: No vital signs or physical exam, this was a telephone or virtual health encounter.  Pt alert and oriented, answers all questions appropriately, and able to speak in full sentences.    Assessment and Plan: Patrick Bell was seen today for covid positive.  Diagnoses and all orders for this visit:  [id number] virus detected Respiratory tract congestion with cough Fever and chills COVID-19 positive, 2 days of symptoms. High risk for significant illness due to underlying cardiovascular history and age. Will start antivirals. Continue symptomatic care as discussed. Report any new, worsening, or persistent symptoms.  -     molnupiravir EUA 200 mg CAPS; Take 4 capsules (800 mg total) by mouth 2 (two) times daily for 5 days.    Follow Up Instructions: Return if  symptoms worsen or fail to improve.    I discussed the assessment and treatment plan with the patient. The patient was provided an opportunity to ask questions and all were answered. The patient agreed with the plan and demonstrated an understanding of the instructions.   The patient was advised to call back or seek an in-person evaluation if the symptoms worsen or if the condition fails to improve as anticipated.  The above assessment and management plan was discussed with the patient. The patient verbalized understanding of and has agreed to the management plan. Patient is aware to call the clinic if they develop any new symptoms or if symptoms persist  or worsen. Patient is aware when to return to the clinic for a follow-up visit. Patient educated on when it is appropriate to go to the emergency department.    I provided 11 minutes of non-face-to-face time during this encounter. The call started at 1133. The call ended at 1140. The other time was used for coordination of care.    Monia Pouch, FNP-C [facility] [address] [place], [place] [address] [phone] 11/30/2020

## 2020-12-02 ENCOUNTER — Other Ambulatory Visit: Payer: Self-pay

## 2020-12-02 ENCOUNTER — Encounter (HOSPITAL_COMMUNITY): Payer: Self-pay

## 2020-12-02 ENCOUNTER — Telehealth: Payer: Self-pay | Admitting: Family Medicine

## 2020-12-02 ENCOUNTER — Emergency Department (HOSPITAL_COMMUNITY): Payer: Medicare Other

## 2020-12-02 ENCOUNTER — Inpatient Hospital Stay (HOSPITAL_COMMUNITY)
Admission: EM | Admit: 2020-12-02 | Discharge: 2020-12-04 | DRG: 177 | Disposition: A | Discharge status: Home / Self Care | Payer: Medicare Other | Attending: Family Medicine | Admitting: Family Medicine

## 2020-12-02 DIAGNOSIS — Z7901 Long term (current) use of anticoagulants: Secondary | ICD-10-CM | POA: Diagnosis not present

## 2020-12-02 DIAGNOSIS — E039 Hypothyroidism, unspecified: Secondary | ICD-10-CM | POA: Diagnosis present

## 2020-12-02 DIAGNOSIS — I7101 Dissection of thoracic aorta: Secondary | ICD-10-CM | POA: Diagnosis not present

## 2020-12-02 DIAGNOSIS — I10 Essential (primary) hypertension: Secondary | ICD-10-CM | POA: Diagnosis not present

## 2020-12-02 DIAGNOSIS — Z8262 Family history of osteoporosis: Secondary | ICD-10-CM | POA: Diagnosis not present

## 2020-12-02 DIAGNOSIS — Z87891 Personal history of nicotine dependence: Secondary | ICD-10-CM

## 2020-12-02 DIAGNOSIS — E785 Hyperlipidemia, unspecified: Secondary | ICD-10-CM | POA: Diagnosis not present

## 2020-12-02 DIAGNOSIS — J449 Chronic obstructive pulmonary disease, unspecified: Secondary | ICD-10-CM | POA: Diagnosis present

## 2020-12-02 DIAGNOSIS — N401 Enlarged prostate with lower urinary tract symptoms: Secondary | ICD-10-CM | POA: Diagnosis present

## 2020-12-02 DIAGNOSIS — U071 COVID-19: Secondary | ICD-10-CM | POA: Diagnosis not present

## 2020-12-02 DIAGNOSIS — I480 Paroxysmal atrial fibrillation: Secondary | ICD-10-CM | POA: Diagnosis present

## 2020-12-02 DIAGNOSIS — R059 Cough, unspecified: Secondary | ICD-10-CM | POA: Diagnosis not present

## 2020-12-02 DIAGNOSIS — K219 Gastro-esophageal reflux disease without esophagitis: Secondary | ICD-10-CM | POA: Diagnosis not present

## 2020-12-02 DIAGNOSIS — R351 Nocturia: Secondary | ICD-10-CM | POA: Diagnosis present

## 2020-12-02 DIAGNOSIS — K4020 Bilateral inguinal hernia, without obstruction or gangrene, not specified as recurrent: Secondary | ICD-10-CM | POA: Diagnosis not present

## 2020-12-02 DIAGNOSIS — R0689 Other abnormalities of breathing: Secondary | ICD-10-CM | POA: Diagnosis not present

## 2020-12-02 DIAGNOSIS — J1282 Pneumonia due to coronavirus disease 2019: Secondary | ICD-10-CM | POA: Diagnosis not present

## 2020-12-02 DIAGNOSIS — Z8249 Family history of ischemic heart disease and other diseases of the circulatory system: Secondary | ICD-10-CM

## 2020-12-02 DIAGNOSIS — J384 Edema of larynx: Secondary | ICD-10-CM | POA: Diagnosis present

## 2020-12-02 DIAGNOSIS — R0602 Shortness of breath: Secondary | ICD-10-CM | POA: Diagnosis not present

## 2020-12-02 DIAGNOSIS — R1310 Dysphagia, unspecified: Secondary | ICD-10-CM | POA: Diagnosis present

## 2020-12-02 DIAGNOSIS — Z79899 Other long term (current) drug therapy: Secondary | ICD-10-CM

## 2020-12-02 DIAGNOSIS — Z79890 Hormone replacement therapy (postmenopausal): Secondary | ICD-10-CM

## 2020-12-02 DIAGNOSIS — K5792 Diverticulitis of intestine, part unspecified, without perforation or abscess without bleeding: Secondary | ICD-10-CM | POA: Diagnosis present

## 2020-12-02 DIAGNOSIS — Z8261 Family history of arthritis: Secondary | ICD-10-CM

## 2020-12-02 DIAGNOSIS — J45909 Unspecified asthma, uncomplicated: Secondary | ICD-10-CM | POA: Diagnosis present

## 2020-12-02 DIAGNOSIS — Z743 Need for continuous supervision: Secondary | ICD-10-CM | POA: Diagnosis not present

## 2020-12-02 DIAGNOSIS — E7800 Pure hypercholesterolemia, unspecified: Secondary | ICD-10-CM | POA: Diagnosis not present

## 2020-12-02 DIAGNOSIS — I4891 Unspecified atrial fibrillation: Secondary | ICD-10-CM | POA: Diagnosis present

## 2020-12-02 LAB — CBC WITH DIFFERENTIAL/PLATELET
Abs Immature Granulocytes: 0.04 10*3/uL (ref 0.00–0.07)
Basophils Absolute: 0 10*3/uL (ref 0.0–0.1)
Basophils Relative: 0 %
Eosinophils Absolute: 0 10*3/uL (ref 0.0–0.5)
Eosinophils Relative: 0 %
HCT: 46.7 % (ref 39.0–52.0)
Hemoglobin: 14.8 g/dL (ref 13.0–17.0)
Immature Granulocytes: 1 %
Lymphocytes Relative: 5 %
Lymphs Abs: 0.4 10*3/uL — ABNORMAL LOW (ref 0.7–4.0)
MCH: 27.7 pg (ref 26.0–34.0)
MCHC: 31.7 g/dL (ref 30.0–36.0)
MCV: 87.5 fL (ref 80.0–100.0)
Monocytes Absolute: 0.6 10*3/uL (ref 0.1–1.0)
Monocytes Relative: 8 %
Neutro Abs: 7.2 10*3/uL (ref 1.7–7.7)
Neutrophils Relative %: 86 %
Platelets: 200 10*3/uL (ref 150–400)
RBC: 5.34 MIL/uL (ref 4.22–5.81)
RDW: 14.8 % (ref 11.5–15.5)
WBC: 8.3 10*3/uL (ref 4.0–10.5)
nRBC: 0 % (ref 0.0–0.2)

## 2020-12-02 LAB — COMPREHENSIVE METABOLIC PANEL
ALT: 10 U/L (ref 0–44)
AST: 19 U/L (ref 15–41)
Albumin: 4 g/dL (ref 3.5–5.0)
Alkaline Phosphatase: 58 U/L (ref 38–126)
Anion gap: 9 (ref 5–15)
BUN: 18 mg/dL (ref 8–23)
CO2: 23 mmol/L (ref 22–32)
Calcium: 8.5 mg/dL — ABNORMAL LOW (ref 8.9–10.3)
Chloride: 102 mmol/L (ref 98–111)
Creatinine, Ser: 1.11 mg/dL (ref 0.61–1.24)
GFR, Estimated: >60 mL/min (ref >60)
Glucose, Bld: 105 mg/dL — ABNORMAL HIGH (ref 70–99)
Potassium: 3.9 mmol/L (ref 3.5–5.1)
Sodium: 134 mmol/L — ABNORMAL LOW (ref 135–145)
Total Bilirubin: 0.8 mg/dL (ref 0.3–1.2)
Total Protein: 7.3 g/dL (ref 6.5–8.1)

## 2020-12-02 LAB — PROTIME-INR
INR: 1.8 — ABNORMAL HIGH (ref 0.8–1.2)
Prothrombin Time: 20.8 seconds — ABNORMAL HIGH (ref 11.4–15.2)

## 2020-12-02 MED ORDER — ONDANSETRON HCL 4 MG/2ML IJ SOLN: 4.0000 mg | Freq: Four times a day (QID) | INTRAMUSCULAR | Status: DC | PRN

## 2020-12-02 MED ORDER — WARFARIN SODIUM 7.5 MG PO TABS
7.5000 mg | ORAL_TABLET | ORAL | Status: AC
  Administered 2020-12-02: 7.5 mg via ORAL
  Filled 2020-12-02: qty 1

## 2020-12-02 MED ORDER — SIMVASTATIN 20 MG PO TABS
10.0000 mg | ORAL_TABLET | Freq: Every day | ORAL | Status: DC
  Administered 2020-12-02 – 2020-12-03 (×2): 10 mg via ORAL
  Filled 2020-12-02 (×2): qty 1

## 2020-12-02 MED ORDER — MOLNUPIRAVIR EUA 200MG CAPSULE
4.0000 | ORAL_CAPSULE | Freq: Two times a day (BID) | ORAL | Status: DC
  Administered 2020-12-03: 800 mg via ORAL

## 2020-12-02 MED ORDER — SODIUM CHLORIDE 0.9 % IV BOLUS
1000.0000 mL | Freq: Once | INTRAVENOUS | Status: AC
  Administered 2020-12-02: 1000 mL via INTRAVENOUS

## 2020-12-02 MED ORDER — MONTELUKAST SODIUM 10 MG PO TABS
10.0000 mg | ORAL_TABLET | Freq: Every day | ORAL | Status: DC
  Administered 2020-12-03 – 2020-12-04 (×2): 10 mg via ORAL
  Filled 2020-12-02 (×2): qty 1

## 2020-12-02 MED ORDER — AMLODIPINE BESYLATE 5 MG PO TABS
2.5000 mg | ORAL_TABLET | Freq: Every day | ORAL | Status: DC
  Administered 2020-12-03 – 2020-12-04 (×2): 2.5 mg via ORAL
  Filled 2020-12-02 (×2): qty 1

## 2020-12-02 MED ORDER — ACETAMINOPHEN 325 MG PO TABS: 650.0000 mg | ORAL_TABLET | Freq: Four times a day (QID) | ORAL | Status: DC | PRN

## 2020-12-02 MED ORDER — PANTOPRAZOLE SODIUM 40 MG PO TBEC
40.0000 mg | DELAYED_RELEASE_TABLET | Freq: Every day | ORAL | Status: DC
  Administered 2020-12-03 – 2020-12-04 (×2): 40 mg via ORAL
  Filled 2020-12-02 (×2): qty 1

## 2020-12-02 MED ORDER — LEVOTHYROXINE SODIUM 50 MCG PO TABS
50.0000 ug | ORAL_TABLET | Freq: Every day | ORAL | Status: DC
  Administered 2020-12-03 – 2020-12-04 (×2): 50 ug via ORAL
  Filled 2020-12-02 (×2): qty 1

## 2020-12-02 MED ORDER — GUAIFENESIN ER 600 MG PO TB12: 1200.0000 mg | ORAL_TABLET | Freq: Two times a day (BID) | ORAL | Status: DC | PRN

## 2020-12-02 MED ORDER — ACETAMINOPHEN 650 MG RE SUPP: 650.0000 mg | Freq: Four times a day (QID) | RECTAL | Status: DC | PRN

## 2020-12-02 MED ORDER — ENOXAPARIN SODIUM 40 MG/0.4ML IJ SOSY: 40.0000 mg | PREFILLED_SYRINGE | Freq: Every day | INTRAMUSCULAR | Status: DC

## 2020-12-02 MED ORDER — IOHEXOL 350 MG/ML SOLN
100.0000 mL | Freq: Once | INTRAVENOUS | Status: AC | PRN
  Administered 2020-12-02: 60 mL via INTRAVENOUS

## 2020-12-02 MED ORDER — METOPROLOL SUCCINATE ER 50 MG PO TB24
50.0000 mg | ORAL_TABLET | Freq: Every day | ORAL | Status: DC
  Administered 2020-12-02 – 2020-12-03 (×2): 50 mg via ORAL
  Filled 2020-12-02 (×2): qty 1

## 2020-12-02 MED ORDER — ONDANSETRON HCL 4 MG PO TABS: 4.0000 mg | ORAL_TABLET | Freq: Four times a day (QID) | ORAL | Status: DC | PRN

## 2020-12-02 MED ORDER — WARFARIN - PHARMACIST DOSING INPATIENT: Freq: Every day | Status: DC

## 2020-12-02 NOTE — Telephone Encounter (Signed)
Patient had a tele visit with Selinda Eon  Please review and advise  Send back to pools

## 2020-12-02 NOTE — Telephone Encounter (Signed)
Wife aware and verbalizes understanding.  

## 2020-12-02 NOTE — H&P (Signed)
History and Physical    Patrick Bell [id number] DOB: [birth year] DOA: 12/02/2020  PCP: Dettinger, Fransisca Kaufmann, MD   Patient coming from: Home.  I have personally briefly reviewed patient's old medical records in [place]  Chief Complaint: Shortness of breath and difficulty swallowing.  HPI: Patrick Bell is a 80 y.o. male with medical history significant of type aortic dissection, arctic dissection repair allergy, asthma, cataracts, diverticulosis, history of diverticulitis, GERD, hyperlipidemia, hypertension, bilateral inguinal hernia who is coming with a history of sore throat, dyspnea and difficulty swallowing since earlier in the day in the setting of COVID-19 infection that became symptomatic on Sunday.  On Monday he went to [facility], tested positive for COVID-19 and was given oral antiviral.  However since then he continues to have URI symptoms and started having difficulty swallowing since yesterday.  Denies fever, complains of chills, decreased appetite, fatigue and malaise.  No chest pain, palpitations, diaphoresis, PND, orthopnea or recent pitting edema of the lower extremities.  Denied abdominal pain, nausea, emesis, diarrhea, constipation, melena or hematochezia.  No dysuria, frequency or hematuria.  However he has nocturia due to BPH.  ED Course:  Initial vital signs were temperature 98.6 F, pulse 88, respiration 20, BP 122/72 mmHg O2 sat 93% on 2 LPM via nasal cannula.  Lab work: CBC showed a white count of 8.3 with 86% neutrophils, hemoglobin 14.8 g/dL platelets 200.  PT 20.8 and INR 1.8.  Coronavirus PCR was positive.  CMP showed a sodium of 134 mmol/L, glucose of 105 and calcium of 8.5 mg/dL.  Imaging: Portable 1 view showed stable left basilar linear atelectasis.  CT neck soft tissue with contrast showed mild circumferential laryngeal soft tissue thickening, possibly due to edema.  Laryngoscopy recommended if symptoms persist.  Please see images and full radiology report  for further detail.  Review of Systems: As per HPI otherwise all other systems reviewed and are negative.  Past Medical History:  Diagnosis Date   Acute thoracic aortic dissection (HCC) 03/11/2020   Type A   Allergy    Asthma    Cataract    Cataract    Diverticulitis    Dyspnea    GERD (gastroesophageal reflux disease)    Heart murmur 12/2019   Hyperlipidemia    Hypertension    Inguinal hernia bilateral, non-recurrent    S/P aortic dissection repair 03/11/2020   supracoronary straight graft repair with resuspension of native aortic valve and open distal anastomosis for intraoperative acute type A aortic dissection    S/P mitral valve repair 03/11/2020   Complex valvuloplasty including artificial Gore-tex neochord placement x 4, suture plication of posterior leaflet and posteromedial commissure and 32 mm Sorin Memo 4D ring annuloplasty   Sleep apnea    Past Surgical History:  Procedure Laterality Date   bilateral inguinal hernia     CARDIAC CATHETERIZATION Bilateral 02/21/2020   CHEST TUBE INSERTION N/A 03/22/2020   Procedure: CHEST TUBE INSERTION OF 28 BLAKE DRAIN.;  Surgeon: Grace Isaac, MD;  Location: [facility];  Service: Thoracic;  Laterality: N/A;   COLONOSCOPY  06/09/2004   [id number] colon diverticulosis, otherwise normal colonoscopy   EYE SURGERY  08/2014   HERNIA REPAIR     MITRAL VALVE REPAIR N/A 03/11/2020   Procedure: MITRAL VALVE REPAIR (MVR) USING MEMO 4D SIZE 32 RING;  Surgeon: Rexene Alberts, MD;  Location: [facility];  Service: Open Heart Surgery;  Laterality: N/A;   PALATE / UVULA BIOPSY / EXCISION  PERICARDIAL WINDOW N/A 03/22/2020   Procedure: SUBXYPHOID POST-OP PERICARDIAL FLUID DRAINAGE WITH CHEST TUBE INSERTION.;  Surgeon: Grace Isaac, MD;  Location: [facility];  Service: Thoracic;  Laterality: N/A;   REPAIR OF ACUTE ASCENDING THORACIC AORTIC DISSECTION N/A 03/11/2020   Procedure: REPAIR OF ACUTE ASCENDING THORACIC AORTIC DISSECTION USING A HEMASHIELD  PLATINUM 26MM STRAIGHT GRAFT AND A HEASHIELD PLATINUM 28X10MM SINGLE ARM GRAFT;  Surgeon: Rexene Alberts, MD;  Location: [facility];  Service: Open Heart Surgery;  Laterality: N/A;   RIGHT/LEFT HEART CATH AND CORONARY ANGIOGRAPHY N/A 02/21/2020   Procedure: RIGHT/LEFT HEART CATH AND CORONARY ANGIOGRAPHY;  Surgeon: Troy Sine, MD;  Location: [facility];  Service: Cardiovascular;  Laterality: N/A;   TEE WITHOUT CARDIOVERSION N/A 12/19/2019   Procedure: TRANSESOPHAGEAL ECHOCARDIOGRAM (TEE);  Surgeon: Buford Dresser, MD;  Location: [facility];  Service: Cardiovascular;  Laterality: N/A;   TEE WITHOUT CARDIOVERSION N/A 03/11/2020   Procedure: TRANSESOPHAGEAL ECHOCARDIOGRAM (TEE);  Surgeon: Rexene Alberts, MD;  Location: [facility];  Service: Open Heart Surgery;  Laterality: N/A;   TEE WITHOUT CARDIOVERSION N/A 03/22/2020   Procedure: TRANSESOPHAGEAL ECHOCARDIOGRAM (TEE);  Surgeon: Grace Isaac, MD;  Location: [facility];  Service: Thoracic;  Laterality: N/A;   TONSILLECTOMY     VASECTOMY     Social History  reports that he quit smoking about 38 years ago. His smoking use included cigarettes. He has a 100.00 pack-year smoking history. He has never used smokeless tobacco. He reports current alcohol use of about 5.0 standard drinks per week. He reports that he does not use drugs.  No Known Allergies  Family History  Problem Relation Age of Onset   Heart disease Mother    Hip fracture Mother 82   Osteoporosis Mother    Dementia Mother 56   Arthritis Father    Heart disease Father    Arthritis Sister    Arthritis Brother    Colon cancer Neg Hx    Prior to Admission medications   Medication Sig Start Date End Date Taking? Authorizing Provider  acetaminophen (TYLENOL) 325 MG tablet Take 1-2 tablets (325-650 mg total) by mouth every 4 (four) hours as needed for mild pain. 04/06/20  Yes Angiulli, Lavon Paganini, PA-C  albuterol (PROAIR HFA) 108 (90 Base) MCG/ACT inhaler Inhale 2 puffs into  the lungs every 6 (six) hours as needed for wheezing. 04/06/20  Yes Angiulli, Lavon Paganini, PA-C  amLODipine (NORVASC) 2.5 MG tablet Take 1 tablet (2.5 mg total) by mouth daily. 07/14/20 07/09/21 Yes Troy Sine, MD  guaiFENesin (MUCINEX) 600 MG 12 hr tablet Take 600 mg by mouth every 4 (four) hours as needed for cough or to loosen phlegm.   Yes [provider]  levothyroxine (SYNTHROID) 50 MCG tablet Take 1 tablet (50 mcg total) by mouth daily. 05/08/20  Yes Dettinger, Fransisca Kaufmann, MD  metoprolol succinate (TOPROL-XL) 50 MG 24 hr tablet Take 1 tablet (50 mg total) by mouth daily. Take with or immediately following a meal. 07/14/20 12/02/20 Yes Troy Sine, MD  molnupiravir EUA 200 mg CAPS Take 4 capsules (800 mg total) by mouth 2 (two) times daily for 5 days. 11/30/20 12/05/20 Yes Rakes, Connye Burkitt, FNP  montelukast (SINGULAIR) 10 MG tablet Take 1 tablet (10 mg total) daily by mouth. 04/06/20  Yes Angiulli, Lavon Paganini, PA-C  Olopatadine HCl 0.2 % SOLN Place 1 drop into both eyes daily as needed (allergies).   Yes [provider]  pantoprazole (PROTONIX) 40 MG tablet  Take 1 tablet (40 mg total) daily by mouth. 04/06/20  Yes Angiulli, Lavon Paganini, PA-C  simvastatin (ZOCOR) 10 MG tablet Take 1 tablet (10 mg total) by mouth at bedtime. 04/06/20  Yes Angiulli, Lavon Paganini, PA-C  warfarin (COUMADIN) 5 MG tablet Take 1 tablet daily at 4 EVENING. Patient taking differently: Take 5 mg by mouth daily at 4 PM. 09/30/20  Yes Dettinger, Fransisca Kaufmann, MD  doxycycline (VIBRA-TABS) 100 MG tablet Take 1 tablet (100 mg total) by mouth 2 (two) times daily. 1 po bid Patient not taking: No sig reported 09/21/20   Dettinger, Fransisca Kaufmann, MD  hydrocortisone 1 % lotion Apply 1 application topically 2 (two) times daily. Patient not taking: No sig reported 11/10/20   Ivy Lynn, NP  Multiple Vitamin (MULTIVITAMIN WITH MINERALS) TABS tablet Take 1 tablet by mouth daily. Patient not taking: No sig reported 04/06/20   Cathlyn Parsons, PA-C     Physical Exam: Vitals:   12/02/20 2030 12/02/20 2100 12/02/20 2130 12/02/20 2200  BP: (!) 160/70 (!) 144/65 (!) 157/78 127/68  Pulse: 84 78 81 78  Resp: (!) 21 (!) 23 (!) 24 (!) 23  Temp:      TempSrc:      SpO2: 98% 95% 97% 97%  Weight:      Height:        Constitutional: NAD, calm, comfortable Eyes: PERRL, lids and conjunctivae normal.  Injected sclera. ENMT: Mucous membranes are moist. Posterior pharynx edematous and erythematous. Neck: normal, supple, no masses, no thyromegaly Respiratory: Mild rhonchi bilaterally, no wheezing, no crackles. Normal respiratory effort. No accessory muscle use.  Cardiovascular: Regular rate and rhythm, no murmurs / rubs / gallops. No extremity edema. 2+ pedal pulses. No carotid bruits.  Abdomen: No distention.  Bowel sounds positive.  Soft, no tenderness, no masses palpated. No hepatosplenomegaly.  Musculoskeletal: no clubbing / cyanosis. Good ROM, no contractures. Normal muscle tone.  Skin: Has some small areas of ecchymosis otherwise no rashes, lesions, ulcers on limited dermatological examination. Neurologic: CN 2-12 grossly intact. Sensation intact, DTR normal. Strength 5/5 in all 4.  Psychiatric: Normal judgment and insight. Alert and oriented x 3. Normal mood.   Labs on Admission: I have personally reviewed following labs and imaging studies  CBC: Recent Labs  Lab 12/02/20 1851  WBC 8.3  NEUTROABS 7.2  HGB 14.8  HCT 46.7  MCV 87.5  PLT [id number]    Basic Metabolic Panel: Recent Labs  Lab 12/02/20 1851  NA 134*  K 3.9  CL 102  CO2 23  GLUCOSE 105*  BUN 18  CREATININE 1.11  CALCIUM 8.5*    GFR: Estimated Creatinine Clearance: 53.9 mL/min (by C-G formula based on SCr of 1.11 mg/dL).  Liver Function Tests: Recent Labs  Lab 12/02/20 1851  AST 19  ALT 10  ALKPHOS 58  BILITOT 0.8  PROT 7.3  ALBUMIN 4.0   Radiological Exams on Admission: CT Soft Tissue Neck W Contrast  Result Date: 12/02/2020 CLINICAL DATA:  Cough  and difficulty swallowing EXAM: CT NECK WITH CONTRAST TECHNIQUE: Multidetector CT imaging of the neck was performed using the standard protocol following the bolus administration of intravenous contrast. CONTRAST:  30m OMNIPAQUE IOHEXOL 350 MG/ML SOLN COMPARISON:  None. FINDINGS: PHARYNX AND LARYNX: Mild circumferential laryngeal soft tissue thickening, possibly due to edema. Otherwise normal pharynx and larynx. Retropharyngeal space is clear. SALIVARY GLANDS: Normal parotid, submandibular and sublingual glands. THYROID: Normal. LYMPH NODES: No enlarged or abnormal density lymph nodes. VASCULAR: Major cervical  vessels are patent. LIMITED INTRACRANIAL: Normal. VISUALIZED ORBITS: Normal. MASTOIDS AND VISUALIZED PARANASAL SINUSES: No fluid levels or advanced mucosal thickening. No mastoid effusion. SKELETON: No bony spinal canal stenosis. No lytic or blastic lesions. UPPER CHEST: Clear. OTHER: None. IMPRESSION: Mild circumferential laryngeal soft tissue thickening, possibly due to edema. Consider correlation with laryngoscopy if symptoms persist. Otherwise normal pharynx and larynx. Electronically Signed   By: Ulyses Jarred M.D.   On: 12/02/2020 20:55   DG Chest Portable 1 View  Result Date: 12/02/2020 CLINICAL DATA:  COVID positive with cough and shortness of breath. EXAM: PORTABLE CHEST 1 VIEW COMPARISON:  April 13, 2020 FINDINGS: Mild linear atelectasis and/or scarring is again seen within the left lung base. There is no evidence of acute infiltrate, pleural effusion or pneumothorax. The heart size and mediastinal contours are within normal limits. The visualized skeletal structures are unremarkable. IMPRESSION: Stable left basilar linear atelectasis and/or scarring. Electronically Signed   By: Virgina Norfolk M.D.   On: 12/02/2020 19:54    EKG: Independently reviewed.   Assessment/Plan Principal Problem:   Laryngeal edema In the setting of   COVID-19 disease Observation/telemetry. Continue  supplemental oxygen. Bronchodilators as needed. Continue glucocorticoids. Continue molnupiravir 800 mg p.o. twice daily.  Active Problems:    PAF (paroxysmal atrial fibrillation) (HCC) CHA?DS?-VASc Score of at least 4. Continue beta-blocker for rate control. Warfarin per pharmacy.    Essential hypertension, benign Continue amlodipine 2.5 mg p.o. daily. Continue Toprol-XL 50 mg p.o. bedtime. Monitor blood pressure and heart rate.    Asthma Bronchodilators as needed. Currently on glucocorticoids.    GERD (gastroesophageal reflux disease) Continue pantoprazole 40 mg p.o. daily.    Hyperlipidemia Continue simvastatin 10 mg p.o. daily.    Hypothyroidism Continue levothyroxine 50 mcg p.o. daily.    DVT prophylaxis: Lovenox SQ. Code Status:   Full code. Family Communication:   Disposition Plan:   Patient is from:  Home.  Anticipated DC to:  Home.  Anticipated DC date:  12/03/2020 or 12/04/2020.  Anticipated DC barriers: Clinical status.  Consults called:   Admission status:  Observation/telemetry.  Severity of Illness:  High severity due to significant pharyngeal and laryngeal edema necessitating high-dose steroids and close airway monitoring.  Reubin Milan MD [facility]  How to contact the [facility] Attending or Consulting provider [place] or covering provider during after hours [place], for this patient?   Check the care team in [facility] and look for a) attending/consulting [facility] provider listed and b) the [facility] team listed Log into [email or web address] and use [place]'s universal password to access. If you do not have the password, please contact the hospital operator. Locate the [facility] provider you are looking for under [facility] and page to a number that you can be directly reached. If you still have difficulty reaching the provider, please page the [facility] (Director on Call) for the Hospitalists listed on amion for assistance.  12/02/2020, 10:58 PM   This document  was prepared using Dragon voice recognition software and may contain some unintended transcription errors.
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
## 2020-12-02 NOTE — ED Notes (Signed)
Pt given banana and water per Dr Olevia Bowens ok

## 2020-12-02 NOTE — Telephone Encounter (Signed)
  Incoming Patient Call  12/02/2020  What symptoms do you have? Now a bad Cough and sore throat  How long have you been sick? 11/30/2020  Have you been seen for this problem? 11/30/2020 televisit  If your provider decides to give you a prescription, which pharmacy would you like for it to be sent to? [facility]   Patient informed that this information will be sent to the clinical staff for review and that they should receive a follow up call.

## 2020-12-02 NOTE — Telephone Encounter (Signed)
Mucinex twice daily with plenty of water, tylenol for fever and pain control. Continue antiviral.

## 2020-12-02 NOTE — ED Triage Notes (Signed)
Pt arrived via [facility] c/o SOB, COVID + since 11/30/2020

## 2020-12-02 NOTE — Progress Notes (Signed)
ANTICOAGULATION CONSULT NOTE - Initial Consult  Pharmacy Consult for Warfarin Indication: atrial fibrillation  No Known Allergies  Patient Measurements: Height: '5\' 10"'$  (177.8 cm) Weight: 79.8 kg (176 lb) IBW/kg (Calculated) : 73  Vital Signs: Temp: 98.6 F (37 C) (08/31 1815) Temp Source: Oral (08/31 1815) BP: 127/68 (08/31 2200) Pulse Rate: 78 (08/31 2200)  Labs: Recent Labs    12/02/20 1851  HGB 14.8  HCT 46.7  PLT 200  LABPROT 20.8*  INR 1.8*  CREATININE 1.11    Estimated Creatinine Clearance: 53.9 mL/min (by C-G formula based on SCr of 1.11 mg/dL).   Medical History: Past Medical History:  Diagnosis Date   Acute thoracic aortic dissection (HCC) 03/11/2020   Type A   Allergy    Asthma    Cataract    Cataract    Diverticulitis    Dyspnea    GERD (gastroesophageal reflux disease)    Heart murmur 12/2019   Hyperlipidemia    Hypertension    Inguinal hernia bilateral, non-recurrent    S/P aortic dissection repair 03/11/2020   supracoronary straight graft repair with resuspension of native aortic valve and open distal anastomosis for intraoperative acute type A aortic dissection    S/P mitral valve repair 03/11/2020   Complex valvuloplasty including artificial Gore-tex neochord placement x 4, suture plication of posterior leaflet and posteromedial commissure and 32 mm Sorin Memo 4D ring annuloplasty   Sleep apnea     Medications:  See electronic med rec  Assessment: 80 y.o. M presents with known COVID infection and swallowing issues. Pt on warfarin PTA for afib. Admission INR 1.8 (slightly subtherapeutic). CBC ok on admission. Home dose: '5mg'$  daily   Goal of Therapy:  INR 2-3 Monitor platelets by anticoagulation protocol: Yes   Plan:  Daily INR Coumadin 7.'5mg'$  tonight  Sherlon Handing, PharmD, BCPS Please see amion for complete clinical pharmacist phone list 12/02/2020,10:48 PM

## 2020-12-03 DIAGNOSIS — I7101 Dissection of thoracic aorta: Secondary | ICD-10-CM | POA: Diagnosis present

## 2020-12-03 DIAGNOSIS — E785 Hyperlipidemia, unspecified: Secondary | ICD-10-CM | POA: Diagnosis present

## 2020-12-03 DIAGNOSIS — I10 Essential (primary) hypertension: Secondary | ICD-10-CM

## 2020-12-03 DIAGNOSIS — U071 COVID-19: Secondary | ICD-10-CM | POA: Diagnosis present

## 2020-12-03 DIAGNOSIS — J1282 Pneumonia due to coronavirus disease 2019: Secondary | ICD-10-CM | POA: Diagnosis present

## 2020-12-03 DIAGNOSIS — J384 Edema of larynx: Secondary | ICD-10-CM

## 2020-12-03 DIAGNOSIS — J45909 Unspecified asthma, uncomplicated: Secondary | ICD-10-CM | POA: Diagnosis present

## 2020-12-03 DIAGNOSIS — R1310 Dysphagia, unspecified: Secondary | ICD-10-CM | POA: Diagnosis present

## 2020-12-03 DIAGNOSIS — N401 Enlarged prostate with lower urinary tract symptoms: Secondary | ICD-10-CM | POA: Diagnosis present

## 2020-12-03 DIAGNOSIS — I480 Paroxysmal atrial fibrillation: Secondary | ICD-10-CM

## 2020-12-03 DIAGNOSIS — Z7901 Long term (current) use of anticoagulants: Secondary | ICD-10-CM | POA: Diagnosis not present

## 2020-12-03 DIAGNOSIS — Z8261 Family history of arthritis: Secondary | ICD-10-CM | POA: Diagnosis not present

## 2020-12-03 DIAGNOSIS — Z8262 Family history of osteoporosis: Secondary | ICD-10-CM | POA: Diagnosis not present

## 2020-12-03 DIAGNOSIS — Z79899 Other long term (current) drug therapy: Secondary | ICD-10-CM | POA: Diagnosis not present

## 2020-12-03 DIAGNOSIS — K5792 Diverticulitis of intestine, part unspecified, without perforation or abscess without bleeding: Secondary | ICD-10-CM | POA: Diagnosis present

## 2020-12-03 DIAGNOSIS — K4020 Bilateral inguinal hernia, without obstruction or gangrene, not specified as recurrent: Secondary | ICD-10-CM | POA: Diagnosis present

## 2020-12-03 DIAGNOSIS — K219 Gastro-esophageal reflux disease without esophagitis: Secondary | ICD-10-CM

## 2020-12-03 DIAGNOSIS — E039 Hypothyroidism, unspecified: Secondary | ICD-10-CM

## 2020-12-03 DIAGNOSIS — Z79890 Hormone replacement therapy (postmenopausal): Secondary | ICD-10-CM | POA: Diagnosis not present

## 2020-12-03 DIAGNOSIS — R351 Nocturia: Secondary | ICD-10-CM | POA: Diagnosis present

## 2020-12-03 DIAGNOSIS — R0602 Shortness of breath: Secondary | ICD-10-CM | POA: Diagnosis present

## 2020-12-03 DIAGNOSIS — Z87891 Personal history of nicotine dependence: Secondary | ICD-10-CM | POA: Diagnosis not present

## 2020-12-03 DIAGNOSIS — Z8249 Family history of ischemic heart disease and other diseases of the circulatory system: Secondary | ICD-10-CM | POA: Diagnosis not present

## 2020-12-03 LAB — RESP PANEL BY RT-PCR (FLU A&B, COVID) ARPGX2
Influenza A by PCR: NEGATIVE
Influenza B by PCR: NEGATIVE
SARS Coronavirus 2 by RT PCR: POSITIVE — AB

## 2020-12-03 LAB — PROTIME-INR
INR: 2.1 — ABNORMAL HIGH (ref 0.8–1.2)
Prothrombin Time: 23.9 seconds — ABNORMAL HIGH (ref 11.4–15.2)

## 2020-12-03 LAB — GROUP A STREP BY PCR: Group A Strep by PCR: NOT DETECTED

## 2020-12-03 MED ORDER — MOLNUPIRAVIR EUA 200MG CAPSULE
4.0000 | ORAL_CAPSULE | Freq: Two times a day (BID) | ORAL | Status: DC
  Administered 2020-12-03 – 2020-12-04 (×2): 800 mg via ORAL

## 2020-12-03 MED ORDER — GUAIFENESIN-DM 100-10 MG/5ML PO SYRP
5.0000 mL | ORAL_SOLUTION | ORAL | Status: DC | PRN
  Administered 2020-12-03 (×2): 5 mL via ORAL
  Filled 2020-12-03 (×3): qty 5

## 2020-12-03 MED ORDER — DEXAMETHASONE SODIUM PHOSPHATE 10 MG/ML IJ SOLN
6.0000 mg | Freq: Once | INTRAMUSCULAR | Status: AC
  Administered 2020-12-03: 6 mg via INTRAVENOUS
  Filled 2020-12-03: qty 1

## 2020-12-03 MED ORDER — PREDNISONE 20 MG PO TABS
40.0000 mg | ORAL_TABLET | Freq: Every day | ORAL | Status: DC
  Administered 2020-12-04: 40 mg via ORAL
  Filled 2020-12-03: qty 2

## 2020-12-03 MED ORDER — METHYLPREDNISOLONE SODIUM SUCC 40 MG IJ SOLR
40.0000 mg | Freq: Two times a day (BID) | INTRAMUSCULAR | Status: AC
  Administered 2020-12-03 (×2): 40 mg via INTRAVENOUS
  Filled 2020-12-03 (×2): qty 1

## 2020-12-03 MED ORDER — WARFARIN SODIUM 5 MG PO TABS
5.0000 mg | ORAL_TABLET | Freq: Once | ORAL | Status: AC
  Administered 2020-12-03: 5 mg via ORAL
  Filled 2020-12-03: qty 1

## 2020-12-03 NOTE — Evaluation (Signed)
Clinical/Bedside Swallow Evaluation Patient Details  Name: Patrick Bell MRN: [id number] Date of Birth: 1940-04-22  Today's Date: 12/03/2020 Time: SLP Start Time (ACUTE ONLY): [id number] SLP Stop Time (ACUTE ONLY): [id number] SLP Time Calculation (min) (ACUTE ONLY): 25 min  Past Medical History:  Past Medical History:  Diagnosis Date   Acute thoracic aortic dissection ([place]) 03/11/2020   Type A   Allergy    Asthma    Cataract    Cataract    Diverticulitis    Dyspnea    GERD (gastroesophageal reflux disease)    Heart murmur 12/2019   Hyperlipidemia    Hypertension    Inguinal hernia bilateral, non-recurrent    S/P aortic dissection repair 03/11/2020   supracoronary straight graft repair with resuspension of native aortic valve and open distal anastomosis for intraoperative acute type A aortic dissection    S/P mitral valve repair 03/11/2020   Complex valvuloplasty including artificial Gore-tex neochord placement x 4, suture plication of posterior leaflet and posteromedial commissure and 32 mm Sorin Memo 4D ring annuloplasty   Sleep apnea    Past Surgical History:  Past Surgical History:  Procedure Laterality Date   bilateral inguinal hernia     CARDIAC CATHETERIZATION Bilateral 02/21/2020   CHEST TUBE INSERTION N/A 03/22/2020   Procedure: CHEST TUBE INSERTION OF 28 BLAKE DRAIN.;  Surgeon: Grace Isaac, MD;  Location: [place] OR;  Service: Thoracic;  Laterality: N/A;   COLONOSCOPY  06/09/2004   [id number] colon diverticulosis, otherwise normal colonoscopy   EYE SURGERY  08/2014   HERNIA REPAIR     MITRAL VALVE REPAIR N/A 03/11/2020   Procedure: MITRAL VALVE REPAIR (MVR) USING MEMO 4D SIZE 32 RING;  Surgeon: Rexene Alberts, MD;  Location: [place];  Service: Open Heart Surgery;  Laterality: N/A;   PALATE / UVULA BIOPSY / EXCISION     PERICARDIAL WINDOW N/A 03/22/2020   Procedure: SUBXYPHOID POST-OP PERICARDIAL FLUID DRAINAGE WITH CHEST TUBE INSERTION.;  Surgeon: Grace Isaac, MD;   Location: [place];  Service: Thoracic;  Laterality: N/A;   REPAIR OF ACUTE ASCENDING THORACIC AORTIC DISSECTION N/A 03/11/2020   Procedure: REPAIR OF ACUTE ASCENDING THORACIC AORTIC DISSECTION USING A HEMASHIELD PLATINUM 26MM STRAIGHT GRAFT AND A HEASHIELD PLATINUM 28X10MM SINGLE ARM GRAFT;  Surgeon: Rexene Alberts, MD;  Location: [place];  Service: Open Heart Surgery;  Laterality: N/A;   RIGHT/LEFT HEART CATH AND CORONARY ANGIOGRAPHY N/A 02/21/2020   Procedure: RIGHT/LEFT HEART CATH AND CORONARY ANGIOGRAPHY;  Surgeon: Troy Sine, MD;  Location: [place] CV LAB;  Service: Cardiovascular;  Laterality: N/A;   TEE WITHOUT CARDIOVERSION N/A 12/19/2019   Procedure: TRANSESOPHAGEAL ECHOCARDIOGRAM (TEE);  Surgeon: Buford Dresser, MD;  Location: [facility] ENDOSCOPY;  Service: Cardiovascular;  Laterality: N/A;   TEE WITHOUT CARDIOVERSION N/A 03/11/2020   Procedure: TRANSESOPHAGEAL ECHOCARDIOGRAM (TEE);  Surgeon: Rexene Alberts, MD;  Location: [place];  Service: Open Heart Surgery;  Laterality: N/A;   TEE WITHOUT CARDIOVERSION N/A 03/22/2020   Procedure: TRANSESOPHAGEAL ECHOCARDIOGRAM (TEE);  Surgeon: Grace Isaac, MD;  Location: [facility] OR;  Service: Thoracic;  Laterality: N/A;   TONSILLECTOMY     VASECTOMY     HPI:  Patrick Bell is a 80 y.o. male with medical history significant of type aortic dissection, arctic dissection repair allergy, asthma, cataracts, diverticulosis, history of diverticulitis, GERD, hyperlipidemia, hypertension, bilateral inguinal hernia who is coming with a history of sore throat, dyspnea and difficulty swallowing since earlier in the day in the setting of COVID-19  infection that became symptomatic on Sunday.  On Monday he went to [facility], tested positive for COVID-19 and was given oral antiviral.  However since then he continues to have URI symptoms and started having difficulty swallowing since yesterday.  Denies fever, Portable 1 view showed stable left basilar linear  atelectasis.  CT neck soft tissue with contrast showed mild circumferential laryngeal soft tissue thickening, possibly due to edema. BSE requested.   Assessment / Plan / Recommendation Clinical Impression  Clinical swallow evaluation completed at bedside. Oral motor examination is WNL with the exception of noted previous surgery for his uvulae (Pt reports 20 years ago for sleep apnea). Pt consumed ice chips, thin water, puree, and regular textures during assessment with a slight facial grimace with regular textures. Pt reports discomfort with swallow, likely due to laryngeal edema from excessive coughing. Pt is now on steroids and hopefully swelling will decrease. Pt has been able to select appropriate soft solids to mitigate discomfort at this time. Pt without overt signs and symptoms of aspiration. Recommend regular textures and thin liquids and Pt to self regulate which regular textures he is comfortable eating. No further SLP services indicated at this time.   SLP Visit Diagnosis: Dysphagia, unspecified (R13.10)    Aspiration Risk  No limitations    Diet Recommendation Regular;Thin liquid (Pt to self regulate regular textures due to pain with swallow)   Liquid Administration via: Cup;Straw Medication Administration: Whole meds with liquid Supervision: Patient able to self feed Postural Changes: Seated upright at 90 degrees;Remain upright for at least 30 minutes after po intake    Other  Recommendations Oral Care Recommendations: Oral care BID Other Recommendations: Clarify dietary restrictions   Follow up Recommendations None      Frequency and Duration            Prognosis Prognosis for Safe Diet Advancement: Good      Swallow Study   General Date of Onset: 12/02/20 HPI: Patrick Bell is a 80 y.o. male with medical history significant of type aortic dissection, arctic dissection repair allergy, asthma, cataracts, diverticulosis, history of diverticulitis, GERD,  hyperlipidemia, hypertension, bilateral inguinal hernia who is coming with a history of sore throat, dyspnea and difficulty swallowing since earlier in the day in the setting of COVID-19 infection that became symptomatic on Sunday.  On [facility], tested [facility] for [facility]-19 and was given oral antiviral.  However since then he continues to have URI symptoms and started having difficulty swallowing since yesterday.  Denies fever, Portable 1 view showed stable left basilar linear atelectasis.  CT neck soft tissue with contrast showed mild circumferential laryngeal soft tissue thickening, possibly due to edema. BSE requested. Type of Study: Bedside Swallow Evaluation Diet Prior to this Study: Regular;Thin liquids;Dysphagia 3 (soft) Temperature Spikes Noted: No Respiratory Status: Room air History of Recent Intubation: No Behavior/Cognition: Alert;Cooperative;Pleasant mood Oral Cavity Assessment: Within Functional Limits (evidence of previous surgery to uvula) Oral Care Completed by SLP: No Oral Cavity - Dentition: Adequate natural dentition Vision: Functional for self-feeding Self-Feeding Abilities: Able to feed self Patient Positioning: Upright in bed Baseline Vocal Quality: Normal Volitional Cough: Strong Volitional Swallow: Able to elicit    Oral/Motor/Sensory Function Overall Oral Motor/Sensory Function: Within functional limits   Ice Chips Ice chips: Within functional limits Presentation: Spoon   Thin Liquid Thin Liquid: Within functional limits Presentation: Cup;Straw;Self Fed    Nectar Thick Nectar Thick Liquid: Not tested   Honey Thick Honey Thick Liquid: Not tested  Puree Puree: Within functional limits Presentation: Self Fed;Spoon   Solid     Solid: Within functional limits Presentation: Self Fed (graham crackers, some grimace with swallow)     Thank you,  Genene Churn, [place] 12/03/2020,3:50 PM

## 2020-12-03 NOTE — Progress Notes (Signed)
ANTICOAGULATION CONSULT NOTE -   Pharmacy Consult for Warfarin Indication: atrial fibrillation  No Known Allergies  Patient Measurements: Height: '5\' 10"'$  (177.8 cm) Weight: 79.8 kg (176 lb) IBW/kg (Calculated) : 73  Vital Signs: Temp: 98.2 F (36.8 C) (09/01 0600) Temp Source: Oral (09/01 0600) BP: 135/66 (09/01 0630) Pulse Rate: 65 (09/01 0630)  Labs: Recent Labs    12/02/20 1851 12/03/20 0422  HGB 14.8  --   HCT 46.7  --   PLT 200  --   LABPROT 20.8* 23.9*  INR 1.8* 2.1*  CREATININE 1.11  --      Estimated Creatinine Clearance: 53.9 mL/min (by C-G formula based on SCr of 1.11 mg/dL).   Medical History: Past Medical History:  Diagnosis Date   Acute thoracic aortic dissection (HCC) 03/11/2020   Type A   Allergy    Asthma    Cataract    Cataract    Diverticulitis    Dyspnea    GERD (gastroesophageal reflux disease)    Heart murmur 12/2019   Hyperlipidemia    Hypertension    Inguinal hernia bilateral, non-recurrent    S/P aortic dissection repair 03/11/2020   supracoronary straight graft repair with resuspension of native aortic valve and open distal anastomosis for intraoperative acute type A aortic dissection    S/P mitral valve repair 03/11/2020   Complex valvuloplasty including artificial Gore-tex neochord placement x 4, suture plication of posterior leaflet and posteromedial commissure and 32 mm Sorin Memo 4D ring annuloplasty   Sleep apnea     Medications:  See electronic med rec  Assessment: 80 y.o. M presents with known COVID infection and swallowing issues. Pt on warfarin PTA for afib.   Home dose: '5mg'$  daily  INR 1.8 > 2.1  Goal of Therapy:  INR 2-3 Monitor platelets by anticoagulation protocol: Yes   Plan:  Warfarin 5 mg x 1 dose. Monitor daily INR and s/s of bleeding.   Margot Ables, PharmD Clinical Pharmacist 12/03/2020 8:30 AM

## 2020-12-03 NOTE — Progress Notes (Signed)
PROGRESS NOTE   Patrick Bell  [id number] DOB: 1940/07/08 DOA: 12/02/2020 PCP: Dettinger, Fransisca Kaufmann, MD   Chief Complaint  Patient presents with   Shortness of Breath   Level of care: Telemetry  Brief Admission History:  80 y.o. male with medical history significant of type aortic dissection, arctic dissection repair allergy, asthma, cataracts, diverticulosis, history of diverticulitis, GERD, hyperlipidemia, hypertension, bilateral inguinal hernia who is coming with a history of sore throat, dyspnea and difficulty swallowing since earlier in the day in the setting of COVID-19 infection that became symptomatic on Sunday.  On Monday he went to [facility], tested positive for COVID-19 and was given oral antiviral.  However since then he continues to have URI symptoms and started having difficulty swallowing since yesterday.  Denies fever, complains of chills, decreased appetite, fatigue and malaise.  No chest pain, palpitations, diaphoresis, PND, orthopnea or recent pitting edema of the lower extremities.  Denied abdominal pain, nausea, emesis, diarrhea, constipation, melena or hematochezia.  No dysuria, frequency or hematuria.  However he has nocturia due to BPH.  Assessment & Plan:   Principal Problem:   Laryngeal edema Active Problems:   Essential hypertension, benign   Asthma   GERD (gastroesophageal reflux disease)   Hyperlipidemia   Hypothyroidism   PAF (paroxysmal atrial fibrillation) (HCC)   Pneumonia due to COVID-19 virus   Covid infection - continue supportive measures as ordered.  He has been started on molnupiravir 800 mg BID.  Laryngeal edema - likely from excessive coughing. He has been started on IV steroids and will continue.  Requested SLP evaluation.   PAF - continue warfarin per pharm dosing and rate control with home beta blocker.   Essential hypertension - well controlled on home toprol XL.   Asthma - stable, bronchodilators as ordered and IV steroids.  GERD -  protonix has been ordered for GI protection.    Hyperlipidemia - resumed home simvastatin.   Hypothyroidism - resumed home levothyroxine.    DVT prophylaxis: enoxaparin  Code Status: Full  Family Communication: telephone call update 9/1  Disposition: anticipate home in 1-2 days  Status is: Inpatient  Remains inpatient appropriate because:IV treatments appropriate due to intensity of illness or inability to take PO and Inpatient level of care appropriate due to severity of illness  Dispo: The patient is from: Home              Anticipated d/c is to: Home              Patient currently is not medically stable to d/c.   Difficult to place patient No       Consultants:  Speech pathologist   Procedures:  N/a   Antimicrobials:  N/a    Subjective: Pt reports that his throat is starting to feel better and he would like to try eating something.    Objective: Vitals:   12/03/20 1100 12/03/20 1130 12/03/20 1200 12/03/20 1355  BP: 136/60 (!) 147/67 (!) 155/62 130/72  Pulse: 72 73 71 70  Resp: (!) '21 15 19 18  '$ Temp:    98.4 F (36.9 C)  TempSrc:    Oral  SpO2: 99% 97% 100% 99%  Weight:      Height:        Intake/Output Summary (Last 24 hours) at 12/03/2020 1607 Last data filed at 12/02/2020 2208 Gross per 24 hour  Intake 1000 ml  Output --  Net 1000 ml   Filed Weights   12/02/20 1801  Weight: 79.8  kg    Examination:  General exam: Appears calm and comfortable  Respiratory system: Clear to auscultation. Respiratory effort normal. Cardiovascular system: normal S1 & S2 heard. No JVD, murmurs, rubs, gallops or clicks. No pedal edema. Gastrointestinal system: Abdomen is nondistended, soft and nontender. No organomegaly or masses felt. Normal bowel sounds heard. Central nervous system: Alert and oriented. No focal neurological deficits. Extremities: Symmetric 5 x 5 power. Skin: No rashes, lesions or ulcers Psychiatry: Judgement and insight appear normal. Mood & affect  appropriate.   Data Reviewed: I have personally reviewed following labs and imaging studies  CBC: Recent Labs  Lab 12/02/20 1851  WBC 8.3  NEUTROABS 7.2  HGB 14.8  HCT 46.7  MCV 87.5  PLT [id number]    Basic Metabolic Panel: Recent Labs  Lab 12/02/20 1851  NA 134*  K 3.9  CL 102  CO2 23  GLUCOSE 105*  BUN 18  CREATININE 1.11  CALCIUM 8.5*    GFR: Estimated Creatinine Clearance: 53.9 mL/min (by C-G formula based on SCr of 1.11 mg/dL).  Liver Function Tests: Recent Labs  Lab 12/02/20 1851  AST 19  ALT 10  ALKPHOS 58  BILITOT 0.8  PROT 7.3  ALBUMIN 4.0    CBG: No results for input(s): GLUCAP in the last 168 hours.  Recent Results (from the past 240 hour(s))  Resp Panel by RT-PCR (Flu A&B, Covid) Nasopharyngeal Swab     Status: Abnormal   Collection Time: 12/02/20 11:01 PM   Specimen: Nasopharyngeal Swab; Nasopharyngeal(NP) swabs in vial transport medium  Result Value Ref Range Status   SARS Coronavirus 2 by RT PCR POSITIVE (A) NEGATIVE Final    Comment: RESULT CALLED TO, READ BACK BY AND VERIFIED WITH: turner,c'@0020'$  by matthews, b 9.1.22 (NOTE) SARS-CoV-2 target nucleic acids are DETECTED.  The SARS-CoV-2 RNA is generally detectable in upper respiratory specimens during the acute phase of infection. Positive results are indicative of the presence of the identified virus, but do not rule out bacterial infection or co-infection with other pathogens not detected by the test. Clinical correlation with patient history and other diagnostic information is necessary to determine patient infection status. The expected result is Negative.  Fact Sheet for Patients: EntrepreneurPulse.com.au  Fact Sheet for Healthcare Providers: IncredibleEmployment.be  This test is not yet approved or cleared by the [place] FDA and  has been authorized for detection and/or diagnosis of SARS-CoV-2 by FDA under an Emergency Use Authorization  (EUA).  This EUA will remain in effect (meaning this test can  be used) for the duration of  the COVID-19 declaration under Section 564(b)(1) of the Act, 21 U.S.C. section 360bbb-3(b)(1), unless the authorization is terminated or revoked sooner.     Influenza A by PCR NEGATIVE NEGATIVE Final   Influenza B by PCR NEGATIVE NEGATIVE Final    Comment: (NOTE) The Xpert Xpress SARS-CoV-2/FLU/RSV plus assay is intended as an aid in the diagnosis of influenza from Nasopharyngeal swab specimens and should not be used as a sole basis for treatment. Nasal washings and aspirates are unacceptable for Xpert Xpress SARS-CoV-2/FLU/RSV testing.  Fact Sheet for Patients: EntrepreneurPulse.com.au  Fact Sheet for Healthcare Providers: IncredibleEmployment.be  This test is not yet approved or cleared by the [place] FDA and has been authorized for detection and/or diagnosis of SARS-CoV-2 by FDA under an Emergency Use Authorization (EUA). This EUA will remain in effect (meaning this test can be used) for the duration of the COVID-19 declaration under Section 564(b)(1) of the Act, 21 U.S.C.  section 360bbb-3(b)(1), unless the authorization is terminated or revoked.  Performed at [facility], [address], [place], [place] [address]      Radiology Studies: CT Soft Tissue Neck W Contrast  Result Date: 12/02/2020 CLINICAL DATA:  Cough and difficulty swallowing EXAM: CT NECK WITH CONTRAST TECHNIQUE: Multidetector CT imaging of the neck was performed using the standard protocol following the bolus administration of intravenous contrast. CONTRAST:  28m OMNIPAQUE IOHEXOL 350 MG/ML SOLN COMPARISON:  None. FINDINGS: PHARYNX AND LARYNX: Mild circumferential laryngeal soft tissue thickening, possibly due to edema. Otherwise normal pharynx and larynx. Retropharyngeal space is clear. SALIVARY GLANDS: Normal parotid, submandibular and sublingual glands. THYROID: Normal.  LYMPH NODES: No enlarged or abnormal density lymph nodes. VASCULAR: Major cervical vessels are patent. LIMITED INTRACRANIAL: Normal. VISUALIZED ORBITS: Normal. MASTOIDS AND VISUALIZED PARANASAL SINUSES: No fluid levels or advanced mucosal thickening. No mastoid effusion. SKELETON: No bony spinal canal stenosis. No lytic or blastic lesions. UPPER CHEST: Clear. OTHER: None. IMPRESSION: Mild circumferential laryngeal soft tissue thickening, possibly due to edema. Consider correlation with laryngoscopy if symptoms persist. Otherwise normal pharynx and larynx. Electronically Signed   By: KUlyses JarredM.D.   On: 12/02/2020 20:55   DG Chest Portable 1 View  Result Date: 12/02/2020 CLINICAL DATA:  COVID positive with cough and shortness of breath. EXAM: PORTABLE CHEST 1 VIEW COMPARISON:  April 13, 2020 FINDINGS: Mild linear atelectasis and/or scarring is again seen within the left lung base. There is no evidence of acute infiltrate, pleural effusion or pneumothorax. The heart size and mediastinal contours are within normal limits. The visualized skeletal structures are unremarkable. IMPRESSION: Stable left basilar linear atelectasis and/or scarring. Electronically Signed   By: TVirgina NorfolkM.D.   On: 12/02/2020 19:54    Scheduled Meds:  amLODipine  2.5 mg Oral Daily   levothyroxine  50 mcg Oral Q0600   methylPREDNISolone (SOLU-MEDROL) injection  40 mg Intravenous Q12H   metoprolol succinate  50 mg Oral QHS   molnupiravir EUA  4 capsule Oral BID   montelukast  10 mg Oral Daily   pantoprazole  40 mg Oral Daily   simvastatin  10 mg Oral QHS   warfarin  5 mg Oral ONCE-1600   Warfarin - Pharmacist Dosing Inpatient   Does not apply q1600   Continuous Infusions:   LOS: 0 days   Time spent: 37 minutes   Keylen Eckenrode JWynetta Emery MD How to contact the [facility] CampusAttending or Consulting provider [place] covering provider during after hours [place] for this patient?  Check the care team in [facility]and look for a)  attending/consulting [facility] provider listed and b) the TFlor[facility] Center Enterprises LLCteam listed Log into [email or web address] and use [place]'s universal password to access. If you do not have the password, please contact the hospital operator. Locate the TG[facility] Hospital At Florenceprovider you are looking for under [facility] and page to a number that you can be directly reached. If you still have difficulty reaching the provider, please page the [facility](Director on Call) for the Hospitalists listed on amion for assistance.  12/03/2020, 4:07 PM

## 2020-12-03 NOTE — Care Management Obs Status (Signed)
[place] NOTIFICATION   Patient Details  Name: CY BARRISH MRN: [id number] Date of Birth: 1941/01/02   Medicare Observation Status Notification Given:  Yes    Ihor Gully, LCSW 12/03/2020, 3:22 PM

## 2020-12-04 DIAGNOSIS — E7800 Pure hypercholesterolemia, unspecified: Secondary | ICD-10-CM

## 2020-12-04 LAB — PROTIME-INR
INR: 3.4 — ABNORMAL HIGH (ref 0.8–1.2)
Prothrombin Time: 34.4 seconds — ABNORMAL HIGH (ref 11.4–15.2)

## 2020-12-04 MED ORDER — DEXTROMETHORPHAN POLISTIREX ER 30 MG/5ML PO SUER: 30.0000 mg | Freq: Two times a day (BID) | ORAL | 0 refills | Status: DC | PRN

## 2020-12-04 MED ORDER — PREDNISONE 20 MG PO TABS: 20.0000 mg | ORAL_TABLET | Freq: Every day | ORAL | 0 refills | Status: AC

## 2020-12-04 NOTE — Discharge Instructions (Signed)
IMPORTANT INFORMATION: PAY CLOSE ATTENTION   PHYSICIAN DISCHARGE INSTRUCTIONS  Follow with Primary care provider  Dettinger, Joshua A, MD  and other consultants as instructed by your Hospitalist Physician  SEEK MEDICAL CARE OR RETURN TO EMERGENCY ROOM IF SYMPTOMS COME BACK, WORSEN OR NEW PROBLEM DEVELOPS   Please note: You were cared for by a hospitalist during your hospital stay. Every effort will be made to forward records to your primary care provider.  You can request that your primary care provider send for your hospital records if they have not received them.  Once you are discharged, your primary care physician will handle any further medical issues. Please note that NO REFILLS for any discharge medications will be authorized once you are discharged, as it is imperative that you return to your primary care physician (or establish a relationship with a primary care physician if you do not have one) for your post hospital discharge needs so that they can reassess your need for medications and monitor your lab values.  Please get a complete blood count and chemistry panel checked by your Primary MD at your next visit, and again as instructed by your Primary MD.  Get Medicines reviewed and adjusted: Please take all your medications with you for your next visit with your Primary MD  Laboratory/radiological data: Please request your Primary MD to go over all hospital tests and procedure/radiological results at the follow up, please ask your primary care provider to get all Hospital records sent to his/her office.  In some cases, they will be blood work, cultures and biopsy results pending at the time of your discharge. Please request that your primary care provider follow up on these results.  If you are diabetic, please bring your blood sugar readings with you to your follow up appointment with primary care.    Please call and make your follow up appointments as soon as possible.    Also  Note the following: If you experience worsening of your admission symptoms, develop shortness of breath, life threatening emergency, suicidal or homicidal thoughts you must seek medical attention immediately by calling 911 or calling your MD immediately  if symptoms less severe.  You must read complete instructions/literature along with all the possible adverse reactions/side effects for all the Medicines you take and that have been prescribed to you. Take any new Medicines after you have completely understood and accpet all the possible adverse reactions/side effects.   Do not drive when taking Pain medications or sleeping medications (Benzodiazepines)  Do not take more than prescribed Pain, Sleep and Anxiety Medications. It is not advisable to combine anxiety,sleep and pain medications without talking with your primary care practitioner  Special Instructions: If you have smoked or chewed Tobacco  in the last 2 yrs please stop smoking, stop any regular Alcohol  and or any Recreational drug use.  Wear Seat belts while driving.  Do not drive if taking any narcotic, mind altering or controlled substances or recreational drugs or alcohol.        

## 2020-12-04 NOTE — Progress Notes (Signed)
ANTICOAGULATION CONSULT NOTE -   Pharmacy Consult for Warfarin Indication: atrial fibrillation  No Known Allergies  Patient Measurements: Height: '5\' 10"'$  (177.8 cm) Weight: 79.8 kg (176 lb) IBW/kg (Calculated) : 73  Vital Signs: Temp: 97.8 F (36.6 C) (09/02 0522) Temp Source: Oral (09/02 0522) BP: 139/81 (09/02 0806) Pulse Rate: 64 (09/02 0806)  Labs: Recent Labs    12/02/20 1851 12/03/20 0422 12/04/20 0558  HGB 14.8  --   --   HCT 46.7  --   --   PLT 200  --   --   LABPROT 20.8* 23.9* 34.4*  INR 1.8* 2.1* 3.4*  CREATININE 1.11  --   --      Estimated Creatinine Clearance: 53.9 mL/min (by C-G formula based on SCr of 1.11 mg/dL).   Medical History: Past Medical History:  Diagnosis Date   Acute thoracic aortic dissection (HCC) 03/11/2020   Type A   Allergy    Asthma    Cataract    Cataract    Diverticulitis    Dyspnea    GERD (gastroesophageal reflux disease)    Heart murmur 12/2019   Hyperlipidemia    Hypertension    Inguinal hernia bilateral, non-recurrent    S/P aortic dissection repair 03/11/2020   supracoronary straight graft repair with resuspension of native aortic valve and open distal anastomosis for intraoperative acute type A aortic dissection    S/P mitral valve repair 03/11/2020   Complex valvuloplasty including artificial Gore-tex neochord placement x 4, suture plication of posterior leaflet and posteromedial commissure and 32 mm Sorin Memo 4D ring annuloplasty   Sleep apnea     Medications:  See electronic med rec  Assessment: 80 y.o. M presents with known COVID infection and swallowing issues. Pt on warfarin PTA for afib.   Home dose: '5mg'$  daily  INR 1.8 > 2.1> 3.4 (big increase in INR with usual home dose)  Goal of Therapy:  INR 2-3 Monitor platelets by anticoagulation protocol: Yes   Plan:  No warfarin today Monitor daily INR and s/s of bleeding.  Isac Sarna, BS Pharm D, [place] Clinical Pharmacist Pager [phone] 12/04/2020  9:18 AM

## 2020-12-04 NOTE — Care Management Important Message (Signed)
Important Message  Patient Details  Name: Patrick Bell MRN: [id number] Date of Birth: 05-14-1940   Medicare Important Message Given:  Yes - Important Message mailed due to current National Emergency     Tommy Medal 12/04/2020, 11:31 AM

## 2020-12-04 NOTE — Discharge Summary (Signed)
Physician Discharge Summary  LAWAYNE FRAGOSO [id number] DOB: 1940/04/26 DOA: 12/02/2020  PCP: Dettinger, Fransisca Kaufmann, MD  Admit date: 12/02/2020 Discharge date: 12/04/2020  Admitted From:  HOME  Disposition:  HOME   Recommendations for Outpatient Follow-up:  Follow up with PCP in 2 weeks  Discharge Condition: STABLE   CODE STATUS: FULL  DIET: regular thin   Brief Hospitalization Summary: Please see all hospital notes, images, labs for full details of the hospitalization. ADMISSION HPI: Patrick Bell is a 80 y.o. male with medical history significant of type aortic dissection, arctic dissection repair allergy, asthma, cataracts, diverticulosis, history of diverticulitis, GERD, hyperlipidemia, hypertension, bilateral inguinal hernia who is coming with a history of sore throat, dyspnea and difficulty swallowing since earlier in the day in the setting of COVID-19 infection that became symptomatic on Sunday.  On Monday he went to [facility], tested positive for COVID-19 and was given oral antiviral.  However since then he continues to have URI symptoms and started having difficulty swallowing since yesterday.  Denies fever, complains of chills, decreased appetite, fatigue and malaise.  No chest pain, palpitations, diaphoresis, PND, orthopnea or recent pitting edema of the lower extremities.  Denied abdominal pain, nausea, emesis, diarrhea, constipation, melena or hematochezia.  No dysuria, frequency or hematuria.  However he has nocturia due to BPH.   ED Course:  Initial vital signs were temperature 98.6 F, pulse 88, respiration 20, BP 122/72 mmHg O2 sat 93% on 2 LPM via nasal cannula.  Hospital Course by problem list     Laryngeal edema In the setting of   COVID-19 disease Pt weaned down to room air  Continue glucocorticoids for 7 more days to complete course Complete course of molnupiravir 800 mg p.o. twice daily. Pt was seen by SLP and recommended for regular diet thin liquids. Pt reports no  difficulty with swallowing at this time.      PAF (paroxysmal atrial fibrillation) (HCC) CHA?DS?-VASc Score of at least 4. Continue beta-blocker for rate control. Warfarin per pharmacy.  INR 3.4. no hemorrhaging seen.      Essential hypertension, benign Continue amlodipine 2.5 mg p.o. daily. Continue Toprol-XL 50 mg p.o. bedtime.     Asthma Bronchodilators as needed. Currently on glucocorticoids.     GERD (gastroesophageal reflux disease) Continue pantoprazole 40 mg p.o. daily.     Hyperlipidemia Continue simvastatin 10 mg p.o. daily.     Hypothyroidism Continue levothyroxine 50 mcg p.o. daily.     DVT prophylaxis:      Lovenox SQ. Code Status:              Full code.  Discharge Diagnoses:  Principal Problem:   Laryngeal edema Active Problems:   Essential hypertension, benign   Asthma   GERD (gastroesophageal reflux disease)   Hyperlipidemia   Hypothyroidism   PAF (paroxysmal atrial fibrillation) (HCC)   Pneumonia due to COVID-19 virus  Discharge Instructions:  Allergies as of 12/04/2020   No Known Allergies      Medication List     STOP taking these medications    doxycycline 100 MG tablet Commonly known as: VIBRA-TABS   hydrocortisone 1 % lotion   multivitamin with minerals Tabs tablet       TAKE these medications    acetaminophen 325 MG tablet Commonly known as: TYLENOL Take 1-2 tablets (325-650 mg total) by mouth every 4 (four) hours as needed for mild pain.   albuterol 108 (90 Base) MCG/ACT inhaler Commonly known as: ProAir HFA Inhale 2 puffs  into the lungs every 6 (six) hours as needed for wheezing.   amLODipine 2.5 MG tablet Commonly known as: NORVASC Take 1 tablet (2.5 mg total) by mouth daily.   dextromethorphan 30 MG/5ML liquid Commonly known as: Delsym Take 5 mLs (30 mg total) by mouth 2 (two) times daily as needed for cough.   guaiFENesin 600 MG 12 hr tablet Commonly known as: MUCINEX Take 600 mg by mouth every 4 (four) hours  as needed for cough or to loosen phlegm.   levothyroxine 50 MCG tablet Commonly known as: SYNTHROID Take 1 tablet (50 mcg total) by mouth daily.   metoprolol succinate 50 MG 24 hr tablet Commonly known as: TOPROL-XL Take 1 tablet (50 mg total) by mouth daily. Take with or immediately following a meal.   molnupiravir EUA 200 mg Caps Take 4 capsules (800 mg total) by mouth 2 (two) times daily for 5 days.   montelukast 10 MG tablet Commonly known as: SINGULAIR Take 1 tablet (10 mg total) daily by mouth.   Olopatadine HCl 0.2 % Soln Place 1 drop into both eyes daily as needed (allergies).   pantoprazole 40 MG tablet Commonly known as: PROTONIX Take 1 tablet (40 mg total) daily by mouth.   predniSONE 20 MG tablet Commonly known as: DELTASONE Take 1 tablet (20 mg total) by mouth daily before breakfast for 7 days. Start taking on: December 05, 2020   simvastatin 10 MG tablet Commonly known as: ZOCOR Take 1 tablet (10 mg total) by mouth at bedtime.   warfarin 5 MG tablet Commonly known as: COUMADIN Take as directed. If you are unsure how to take this medication, talk to your nurse or doctor. Original instructions: Take 1 tablet daily at 4 EVENING. What changed: See the new instructions.        Follow-up Information     Dettinger, Fransisca Kaufmann, MD. Schedule an appointment as soon as possible for a visit in 2 week(s).   Specialties: Family Medicine, Cardiology Why: Hospital Follow Up Contact information: [place] [address] [phone]         Troy Sine, MD .   Specialty: Cardiology Contact information: [address] [place] [address] [place] [address] [phone]                No Known Allergies Allergies as of 12/04/2020   No Known Allergies      Medication List     STOP taking these medications    doxycycline 100 MG tablet Commonly known as: VIBRA-TABS   hydrocortisone 1 % lotion   multivitamin with minerals Tabs  tablet       TAKE these medications    acetaminophen 325 MG tablet Commonly known as: TYLENOL Take 1-2 tablets (325-650 mg total) by mouth every 4 (four) hours as needed for mild pain.   albuterol 108 (90 Base) MCG/ACT inhaler Commonly known as: ProAir HFA Inhale 2 puffs into the lungs every 6 (six) hours as needed for wheezing.   amLODipine 2.5 MG tablet Commonly known as: NORVASC Take 1 tablet (2.5 mg total) by mouth daily.   dextromethorphan 30 MG/5ML liquid Commonly known as: Delsym Take 5 mLs (30 mg total) by mouth 2 (two) times daily as needed for cough.   guaiFENesin 600 MG 12 hr tablet Commonly known as: MUCINEX Take 600 mg by mouth every 4 (four) hours as needed for cough or to loosen phlegm.   levothyroxine 50 MCG tablet Commonly known as: SYNTHROID Take 1 tablet (  50 mcg total) by mouth daily.   metoprolol succinate 50 MG 24 hr tablet Commonly known as: TOPROL-XL Take 1 tablet (50 mg total) by mouth daily. Take with or immediately following a meal.   molnupiravir EUA 200 mg Caps Take 4 capsules (800 mg total) by mouth 2 (two) times daily for 5 days.   montelukast 10 MG tablet Commonly known as: SINGULAIR Take 1 tablet (10 mg total) daily by mouth.   Olopatadine HCl 0.2 % Soln Place 1 drop into both eyes daily as needed (allergies).   pantoprazole 40 MG tablet Commonly known as: PROTONIX Take 1 tablet (40 mg total) daily by mouth.   predniSONE 20 MG tablet Commonly known as: DELTASONE Take 1 tablet (20 mg total) by mouth daily before breakfast for 7 days. Start taking on: December 05, 2020   simvastatin 10 MG tablet Commonly known as: ZOCOR Take 1 tablet (10 mg total) by mouth at bedtime.   warfarin 5 MG tablet Commonly known as: COUMADIN Take as directed. If you are unsure how to take this medication, talk to your nurse or doctor. Original instructions: Take 1 tablet daily at 4 EVENING. What changed: See the new instructions.         Procedures/Studies: CT Soft Tissue Neck W Contrast  Result Date: 12/02/2020 CLINICAL DATA:  Cough and difficulty swallowing EXAM: CT NECK WITH CONTRAST TECHNIQUE: Multidetector CT imaging of the neck was performed using the standard protocol following the bolus administration of intravenous contrast. CONTRAST:  2m OMNIPAQUE IOHEXOL 350 MG/ML SOLN COMPARISON:  None. FINDINGS: PHARYNX AND LARYNX: Mild circumferential laryngeal soft tissue thickening, possibly due to edema. Otherwise normal pharynx and larynx. Retropharyngeal space is clear. SALIVARY GLANDS: Normal parotid, submandibular and sublingual glands. THYROID: Normal. LYMPH NODES: No enlarged or abnormal density lymph nodes. VASCULAR: Major cervical vessels are patent. LIMITED INTRACRANIAL: Normal. VISUALIZED ORBITS: Normal. MASTOIDS AND VISUALIZED PARANASAL SINUSES: No fluid levels or advanced mucosal thickening. No mastoid effusion. SKELETON: No bony spinal canal stenosis. No lytic or blastic lesions. UPPER CHEST: Clear. OTHER: None. IMPRESSION: Mild circumferential laryngeal soft tissue thickening, possibly due to edema. Consider correlation with laryngoscopy if symptoms persist. Otherwise normal pharynx and larynx. Electronically Signed   By: KUlyses JarredM.D.   On: 12/02/2020 20:55   DG Chest Portable 1 View  Result Date: 12/02/2020 CLINICAL DATA:  COVID positive with cough and shortness of breath. EXAM: PORTABLE CHEST 1 VIEW COMPARISON:  April 13, 2020 FINDINGS: Mild linear atelectasis and/or scarring is again seen within the left lung base. There is no evidence of acute infiltrate, pleural effusion or pneumothorax. The heart size and mediastinal contours are within normal limits. The visualized skeletal structures are unremarkable. IMPRESSION: Stable left basilar linear atelectasis and/or scarring. Electronically Signed   By: TVirgina NorfolkM.D.   On: 12/02/2020 19:54     Subjective: Pt says he feels much better, he is having no  problems with swallowing.    Discharge Exam: Vitals:   12/04/20 0522 12/04/20 0806  BP: 121/74 139/81  Pulse: 73 64  Resp: 18   Temp: 97.8 F (36.6 C)   SpO2: 97% 95%   Vitals:   12/03/20 1900 12/03/20 2231 12/04/20 0522 12/04/20 0806  BP: (!) 128/57 (!) 120/56 121/74 139/81  Pulse: 76 66 73 64  Resp: '16 16 18   '$ Temp:  98 F (36.7 C) 97.8 F (36.6 C)   TempSrc:  Oral Oral   SpO2: 98% 98% 97% 95%  Weight:  Height:       General: Pt is alert, awake, not in acute distress Cardiovascular: normal S1/S2 +, no rubs, no gallops Respiratory: CTA bilaterally, no wheezing, no rhonchi Abdominal: Soft, NT, ND, bowel sounds + Extremities: no edema, no cyanosis   The results of significant diagnostics from this hospitalization (including imaging, microbiology, ancillary and laboratory) are listed below for reference.     Microbiology: Recent Results (from the past 240 hour(s))  Resp Panel by RT-PCR (Flu A&B, Covid) Nasopharyngeal Swab     Status: Abnormal   Collection Time: 12/02/20 11:01 PM   Specimen: Nasopharyngeal Swab; Nasopharyngeal(NP) swabs in vial transport medium  Result Value Ref Range Status   SARS Coronavirus 2 by RT PCR POSITIVE (A) NEGATIVE Final    Comment: RESULT CALLED TO, READ BACK BY AND VERIFIED WITH: turner,c'@0020'$  by matthews, b 9.1.22 (NOTE) SARS-CoV-2 target nucleic acids are DETECTED.  The SARS-CoV-2 RNA is generally detectable in upper respiratory specimens during the acute phase of infection. Positive results are indicative of the presence of the identified virus, but do not rule out bacterial infection or co-infection with other pathogens not detected by the test. Clinical correlation with patient history and other diagnostic information is necessary to determine patient infection status. The expected result is Negative.  Fact Sheet for Patients: EntrepreneurPulse.com.au  Fact Sheet for Healthcare  Providers: IncredibleEmployment.be  This test is not yet approved or cleared by the [place] FDA and  has been authorized for detection and/or diagnosis of SARS-CoV-2 by FDA under an Emergency Use Authorization (EUA).  This EUA will remain in effect (meaning this test can  be used) for the duration of  the COVID-19 declaration under Section 564(b)(1) of the Act, 21 U.S.C. section 360bbb-3(b)(1), unless the authorization is terminated or revoked sooner.     Influenza A by PCR NEGATIVE NEGATIVE Final   Influenza B by PCR NEGATIVE NEGATIVE Final    Comment: (NOTE) The Xpert Xpress SARS-CoV-2/FLU/RSV plus assay is intended as an aid in the diagnosis of influenza from Nasopharyngeal swab specimens and should not be used as a sole basis for treatment. Nasal washings and aspirates are unacceptable for Xpert Xpress SARS-CoV-2/FLU/RSV testing.  Fact Sheet for Patients: EntrepreneurPulse.com.au  Fact Sheet for Healthcare Providers: IncredibleEmployment.be  This test is not yet approved or cleared by the [place] FDA and has been authorized for detection and/or diagnosis of SARS-CoV-2 by FDA under an Emergency Use Authorization (EUA). This EUA will remain in effect (meaning this test can be used) for the duration of the COVID-19 declaration under Section 564(b)(1) of the Act, 21 U.S.C. section 360bbb-3(b)(1), unless the authorization is terminated or revoked.  Performed at [facility], [address] [place],  [address]   Group A Strep by PCR     Status: None   Collection Time: 12/03/20 10:24 PM   Specimen: Throat; Sterile Swab  Result Value Ref Range Status   Group A Strep by PCR NOT DETECTED NOT DETECTED Final    Comment: Performed at [facility], [address], [place],  [address]     Labs: BNP (last 3 results) Recent Labs    09/10/20 1154  BNP 123456*   Basic Metabolic Panel: Recent Labs  Lab  12/02/20 1851  NA 134*  K 3.9  CL 102  CO2 23  GLUCOSE 105*  BUN 18  CREATININE 1.11  CALCIUM 8.5*   Liver Function Tests: Recent Labs  Lab 12/02/20 1851  AST 19  ALT 10  ALKPHOS 58  BILITOT 0.8  PROT 7.3  ALBUMIN 4.0   No results for input(s): LIPASE, AMYLASE in the last 168 hours. No results for input(s): AMMONIA in the last 168 hours. CBC: Recent Labs  Lab 12/02/20 1851  WBC 8.3  NEUTROABS 7.2  HGB 14.8  HCT 46.7  MCV 87.5  PLT 200   Cardiac Enzymes: No results for input(s): CKTOTAL, CKMB, CKMBINDEX, TROPONINI in the last 168 hours. BNP: Invalid input(s): POCBNP CBG: No results for input(s): GLUCAP in the last 168 hours. D-Dimer No results for input(s): DDIMER in the last 72 hours. Hgb A1c No results for input(s): HGBA1C in the last 72 hours. Lipid Profile No results for input(s): CHOL, HDL, LDLCALC, TRIG, CHOLHDL, LDLDIRECT in the last 72 hours. Thyroid function studies No results for input(s): TSH, T4TOTAL, T3FREE, THYROIDAB in the last 72 hours.  Invalid input(s): FREET3 Anemia work up No results for input(s): VITAMINB12, FOLATE, FERRITIN, TIBC, IRON, RETICCTPCT in the last 72 hours. Urinalysis    Component Value Date/Time   COLORURINE YELLOW 03/09/2020 1143   APPEARANCEUR CLEAR 03/09/2020 1143   LABSPEC 1.021 03/09/2020 1143   PHURINE 5.0 03/09/2020 1143   GLUCOSEU NEGATIVE 03/09/2020 1143   HGBUR SMALL (A) 03/09/2020 1143   BILIRUBINUR NEGATIVE 03/09/2020 1143   KETONESUR NEGATIVE 03/09/2020 1143   PROTEINUR NEGATIVE 03/09/2020 1143   NITRITE NEGATIVE 03/09/2020 1143   LEUKOCYTESUR NEGATIVE 03/09/2020 1143   Sepsis Labs Invalid input(s): PROCALCITONIN,  WBC,  LACTICIDVEN Microbiology Recent Results (from the past 240 hour(s))  Resp Panel by RT-PCR (Flu A&B, Covid) Nasopharyngeal Swab     Status: Abnormal   Collection Time: 12/02/20 11:01 PM   Specimen: Nasopharyngeal Swab; Nasopharyngeal(NP) swabs in vial transport medium  Result  Value Ref Range Status   SARS Coronavirus 2 by RT PCR POSITIVE (A) NEGATIVE Final    Comment: RESULT CALLED TO, READ BACK BY AND VERIFIED WITH: turner,c'@0020'$  by matthews, b 9.1.22 (NOTE) SARS-CoV-2 target nucleic acids are DETECTED.  The SARS-CoV-2 RNA is generally detectable in upper respiratory specimens during the acute phase of infection. Positive results are indicative of the presence of the identified virus, but do not rule out bacterial infection or co-infection with other pathogens not detected by the test. Clinical correlation with patient history and other diagnostic information is necessary to determine patient infection status. The expected result is Negative.  Fact Sheet for Patients: EntrepreneurPulse.com.au  Fact Sheet for Healthcare Providers: IncredibleEmployment.be  This test is not yet approved or cleared by the [place] FDA and  has been authorized for detection and/or diagnosis of SARS-CoV-2 by FDA under an Emergency Use Authorization (EUA).  This EUA will remain in effect (meaning this test can  be used) for the duration of  the COVID-19 declaration under Section 564(b)(1) of the Act, 21 U.S.C. section 360bbb-3(b)(1), unless the authorization is terminated or revoked sooner.     Influenza A by PCR NEGATIVE NEGATIVE Final   Influenza B by PCR NEGATIVE NEGATIVE Final    Comment: (NOTE) The Xpert Xpress SARS-CoV-2/FLU/RSV plus assay is intended as an aid in the diagnosis of influenza from Nasopharyngeal swab specimens and should not be used as a sole basis for treatment. Nasal washings and aspirates are unacceptable for Xpert Xpress SARS-CoV-2/FLU/RSV testing.  Fact Sheet for Patients: EntrepreneurPulse.com.au  Fact Sheet for Healthcare Providers: IncredibleEmployment.be  This test is not yet approved or cleared by the [place] FDA and has been authorized for detection  and/or diagnosis of SARS-CoV-2 by FDA under an Emergency Use  Authorization (EUA). This EUA will remain in effect (meaning this test can be used) for the duration of the COVID-19 declaration under Section 564(b)(1) of the Act, 21 U.S.C. section 360bbb-3(b)(1), unless the authorization is terminated or revoked.  Performed at [facility], [address], [place], [place] [address]   Group A Strep by PCR     Status: None   Collection Time: 12/03/20 10:24 PM   Specimen: Throat; Sterile Swab  Result Value Ref Range Status   Group A Strep by PCR NOT DETECTED NOT DETECTED Final    Comment: Performed at [facility], [address] [place], [place] [address]   Time coordinating discharge:   SIGNED:  Irwin Brakeman, MD  [facility] 12/04/2020, 11:49 AM How to contact the [facility] Attending or Consulting provider [place] or covering provider during after hours [place], for this patient?  Check the care team in [facility] and look for a) attending/consulting [facility] provider listed and b) the [facility] team listed Log into [email or web address] and use [place]'s universal password to access. If you do not have the password, please contact the hospital operator. Locate the [facility] provider you are looking for under [facility] and page to a number that you can be directly reached. If you still have difficulty reaching the provider, please page the [facility] (Director on Call) for the Hospitalists listed on amion for assistance.

## 2020-12-08 ENCOUNTER — Telehealth: Payer: Self-pay

## 2020-12-08 NOTE — Telephone Encounter (Signed)
Transition Care Management Follow-up Telephone Call Date of discharge and from where: [facility] on 12/04/2020 Diagnosis: Laryngeal Edema due to Covid How have you been since you were released from the hospital? Pt states he is doing much better.  Any questions or concerns? No  Items Reviewed: Did the pt receive and understand the discharge instructions provided? Yes  Medications obtained and verified? Yes  Other? No  Any new allergies since your discharge? No  Dietary orders reviewed? Yes Do you have support at home? Yes   Home Care and Equipment/Supplies: Were home health services ordered? no If so, what is the name of the agency? N/A  Has the agency set up a time to come to the patient's home? not applicable Were any new equipment or medical supplies ordered?  No What is the name of the medical supply agency? N/A Were you able to get the supplies/equipment? not applicable Do you have any questions related to the use of the equipment or supplies? No  Functional Questionnaire: (I = Independent and D = Dependent) ADLs: I  Bathing/Dressing- I  Meal Prep- I  Eating- I  Maintaining continence- I  Transferring/Ambulation- I  Managing Meds- I  Follow up appointments reviewed:  PCP Hospital f/u appt confirmed? Yes  Scheduled to see Onyeje on 12/14/20 @ 8 am. [facility] f/u appt confirmed? Yes  Scheduled to see Dr. Claiborne Billings on 12/17/20 @ 8 pm. Are transportation arrangements needed? No  If their condition worsens, is the pt aware to call PCP or go to the Emergency Dept.? Yes Was the patient provided with contact information for the PCP's office or ED? Yes Was to pt encouraged to call back with questions or concerns? Yes

## 2020-12-10 ENCOUNTER — Other Ambulatory Visit: Payer: Self-pay

## 2020-12-10 ENCOUNTER — Ambulatory Visit: Payer: Medicare Other

## 2020-12-10 DIAGNOSIS — Z5181 Encounter for therapeutic drug level monitoring: Secondary | ICD-10-CM

## 2020-12-10 DIAGNOSIS — I7101 Dissection of thoracic aorta: Secondary | ICD-10-CM | POA: Diagnosis not present

## 2020-12-10 DIAGNOSIS — Z9889 Other specified postprocedural states: Secondary | ICD-10-CM | POA: Diagnosis not present

## 2020-12-10 DIAGNOSIS — I71019 Dissection of thoracic aorta, unspecified: Secondary | ICD-10-CM

## 2020-12-10 LAB — POCT INR: INR: 3.9 — AB (ref 2.0–3.0)

## 2020-12-10 NOTE — Patient Instructions (Signed)
-   skip warfarin tonight, - take 1/2 tablet warfarin tomorrow, then - on Saturday resume taking 1 tablet daily except 1/2 tablet each Sunday.   - Repeat INR in 4 weeks

## 2020-12-14 ENCOUNTER — Ambulatory Visit (INDEPENDENT_AMBULATORY_CARE_PROVIDER_SITE_OTHER): Payer: Medicare Other | Admitting: Nurse Practitioner

## 2020-12-14 ENCOUNTER — Encounter: Payer: Self-pay | Admitting: Nurse Practitioner

## 2020-12-14 ENCOUNTER — Other Ambulatory Visit: Payer: Self-pay

## 2020-12-14 VITALS — BP 138/75 | HR 75 | Temp 97.5°F | Ht 70.0 in | Wt 175.1 lb

## 2020-12-14 DIAGNOSIS — Z8616 Personal history of COVID-19: Secondary | ICD-10-CM | POA: Diagnosis not present

## 2020-12-14 DIAGNOSIS — U071 COVID-19: Secondary | ICD-10-CM

## 2020-12-14 DIAGNOSIS — Z09 Encounter for follow-up examination after completed treatment for conditions other than malignant neoplasm: Secondary | ICD-10-CM

## 2020-12-14 NOTE — Progress Notes (Signed)
Established Patient Office Visit  Subjective:  Patient ID: Patrick Bell, male    DOB: 1940/12/01  Age: 80 y.o. MRN: [id number]  CC:  Chief Complaint  Patient presents with   Transitions Of Care    HPI Patrick Bell presents for Today's visit was for Transitional Care Management.  The patient was discharged from [facility] on 12/04/2020 with a primary diagnosis of Laryngeal Edema due to covid.   Contact with the patient and/or caregiver, by a clinical staff member, was made on 12/08/2020 and was documented as a telephone encounter within the EMR.  Through chart review and discussion with the patient I have determined that management of their condition is of moderate complexity.    Patient is a 80 year old male who presented to the emergency department after testing +2 days prior with COVID-19.  Patient was treated from clinic with an oral antiviral however he continues to experience worsening upper respiratory symptoms and difficulty swallowing and so he went to the emergency department.  Patient was treated, stabilized and discharged to follow-up with PCP.  Patient is feeling stronger today, he reports only very mild shortness of breath,/tightness.  Intermittent coughing and overall feels good.    Past Medical History:  Diagnosis Date   Acute thoracic aortic dissection (HCC) 03/11/2020   Type A   Allergy    Asthma    Cataract    Cataract    Diverticulitis    Dyspnea    GERD (gastroesophageal reflux disease)    Heart murmur 12/2019   Hyperlipidemia    Hypertension    Inguinal hernia bilateral, non-recurrent    S/P aortic dissection repair 03/11/2020   supracoronary straight graft repair with resuspension of native aortic valve and open distal anastomosis for intraoperative acute type A aortic dissection    S/P mitral valve repair 03/11/2020   Complex valvuloplasty including artificial Gore-tex neochord placement x 4, suture plication of posterior leaflet and posteromedial  commissure and 32 mm Sorin Memo 4D ring annuloplasty   Sleep apnea     Past Surgical History:  Procedure Laterality Date   bilateral inguinal hernia     CARDIAC CATHETERIZATION Bilateral 02/21/2020   CHEST TUBE INSERTION N/A 03/22/2020   Procedure: CHEST TUBE INSERTION OF 28 BLAKE DRAIN.;  Surgeon: Grace Isaac, MD;  Location: [place] OR;  Service: Thoracic;  Laterality: N/A;   COLONOSCOPY  06/09/2004   WIO:XBDZHGD colon diverticulosis, otherwise normal colonoscopy   EYE SURGERY  08/2014   HERNIA REPAIR     MITRAL VALVE REPAIR N/A 03/11/2020   Procedure: MITRAL VALVE REPAIR (MVR) USING MEMO 4D SIZE 32 RING;  Surgeon: Rexene Alberts, MD;  Location: [place];  Service: Open Heart Surgery;  Laterality: N/A;   PALATE / UVULA BIOPSY / EXCISION     PERICARDIAL WINDOW N/A 03/22/2020   Procedure: SUBXYPHOID POST-OP PERICARDIAL FLUID DRAINAGE WITH CHEST TUBE INSERTION.;  Surgeon: Grace Isaac, MD;  Location: [place];  Service: Thoracic;  Laterality: N/A;   REPAIR OF ACUTE ASCENDING THORACIC AORTIC DISSECTION N/A 03/11/2020   Procedure: REPAIR OF ACUTE ASCENDING THORACIC AORTIC DISSECTION USING A HEMASHIELD PLATINUM 26MM STRAIGHT GRAFT AND A HEASHIELD PLATINUM 28X10MM SINGLE ARM GRAFT;  Surgeon: Rexene Alberts, MD;  Location: [place];  Service: Open Heart Surgery;  Laterality: N/A;   RIGHT/LEFT HEART CATH AND CORONARY ANGIOGRAPHY N/A 02/21/2020   Procedure: RIGHT/LEFT HEART CATH AND CORONARY ANGIOGRAPHY;  Surgeon: Troy Sine, MD;  Location: [place] [facility];  Service: Cardiovascular;  Laterality: N/A;   TEE WITHOUT CARDIOVERSION N/A 12/19/2019   Procedure: TRANSESOPHAGEAL ECHOCARDIOGRAM (TEE);  Surgeon: Buford Dresser, MD;  Location: [facility] ENDOSCOPY;  Service: Cardiovascular;  Laterality: N/A;   TEE WITHOUT CARDIOVERSION N/A 03/11/2020   Procedure: TRANSESOPHAGEAL ECHOCARDIOGRAM (TEE);  Surgeon: Rexene Alberts, MD;  Location: [place];  Service: Open Heart Surgery;  Laterality: N/A;   TEE  WITHOUT CARDIOVERSION N/A 03/22/2020   Procedure: TRANSESOPHAGEAL ECHOCARDIOGRAM (TEE);  Surgeon: Grace Isaac, MD;  Location: [facility] OR;  Service: Thoracic;  Laterality: N/A;   TONSILLECTOMY     VASECTOMY      Family History  Problem Relation Age of Onset   Heart disease Mother    Hip fracture Mother 62   Osteoporosis Mother    Dementia Mother 89   Arthritis Father    Heart disease Father    Arthritis Sister    Arthritis Brother    Colon cancer Neg Hx     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  Tobacco Use   Smoking status: Former    Packs/day: 4.00    Years: 25.00    Pack years: 100.00    Types: Cigarettes    Quit date: 06/23/1982    Years since quitting: 38.5   Smokeless tobacco: Never   Tobacco comments:    pt was a truck Land Use: Never used  Substance and Sexual Activity   Alcohol use: Yes    Alcohol/week: 5.0 standard drinks    Types: 5 Shots of liquor per week    Comment: brandy a few times a month, beer once a month   Drug use: No   Sexual activity: Yes    Birth control/protection: Surgical  Other Topics Concern   Not on file  Social History Narrative   Not on file   Social Determinants of Health   Financial Resource Strain: Not on file  Food Insecurity: Not on file  Transportation Needs: Not on file  Physical Activity: Insufficiently Active   Days of Exercise per Week: 4 days   Minutes of Exercise per Session: 30 min  Stress: No Stress Concern Present   Feeling of Stress : Not at all  Social Connections: Not on file  Intimate Partner Violence: Not on file    Outpatient Medications Prior to Visit  Medication Sig Dispense Refill   acetaminophen (TYLENOL) 325 MG tablet  Take 1-2 tablets (325-650 mg total) by mouth every 4 (four) hours as needed for mild pain.     albuterol (PROAIR HFA) 108 (90 Base) MCG/ACT inhaler Inhale 2 puffs into the lungs every 6 (six) hours as needed for wheezing. 8.5 g 2   amLODipine (NORVASC) 2.5 MG tablet Take 1 tablet (2.5 mg total) by mouth daily. 90 tablet 3   guaiFENesin (MUCINEX) 600 MG 12 hr tablet Take 600 mg by mouth every 4 (four) hours as needed for cough or to loosen phlegm.     levothyroxine (SYNTHROID) 50 MCG tablet Take 1 tablet (50 mcg total) by mouth daily. 90 tablet 3   metoprolol succinate (TOPROL-XL) 50 MG 24 hr tablet Take 1 tablet (50 mg total) by mouth daily. Take with or immediately following a  meal. 90 tablet 3   montelukast (SINGULAIR) 10 MG tablet Take 1 tablet (10 mg total) daily by mouth. 90 tablet 3   Olopatadine HCl 0.2 % SOLN Place 1 drop into both eyes daily as needed (allergies).     pantoprazole (PROTONIX) 40 MG tablet Take 1 tablet (40 mg total) daily by mouth. 90 tablet 3   simvastatin (ZOCOR) 10 MG tablet Take 1 tablet (10 mg total) by mouth at bedtime. 90 tablet 3   warfarin (COUMADIN) 5 MG tablet Take 1 tablet daily at 4 EVENING. (Patient taking differently: Take 5 mg by mouth daily at 4 PM.) 30 tablet 2   dextromethorphan (DELSYM) 30 MG/5ML liquid Take 5 mLs (30 mg total) by mouth 2 (two) times daily as needed for cough. 89 mL 0   No facility-administered medications prior to visit.    No Known Allergies  ROS Review of Systems  Constitutional: Negative.   HENT:  Positive for postnasal drip.   Eyes: Negative.   Respiratory:  Positive for cough.   Cardiovascular: Negative.   Gastrointestinal: Negative.   Genitourinary: Negative.   Musculoskeletal: Negative.   Skin:  Negative for rash.  All other systems reviewed and are negative.    Objective:    Physical Exam Vitals and nursing note reviewed.  Constitutional:      Appearance: Normal appearance.  HENT:     Head: Normocephalic.      Nose: Rhinorrhea present. No congestion.     Mouth/Throat:     Mouth: Mucous membranes are moist.     Pharynx: Oropharynx is clear.  Eyes:     Conjunctiva/sclera: Conjunctivae normal.  Cardiovascular:     Rate and Rhythm: Normal rate and regular rhythm.     Pulses: Normal pulses.     Heart sounds: Normal heart sounds.  Pulmonary:     Effort: Pulmonary effort is normal.     Breath sounds: Normal breath sounds.  Abdominal:     General: Bowel sounds are normal.  Skin:    Findings: No rash.  Neurological:     Mental Status: He is alert and oriented to person, place, and time.  Psychiatric:        Behavior: Behavior normal.    BP 138/75   Pulse 75   Temp (!) 97.5 F (36.4 C) (Temporal)   Ht 5' 10"  (1.778 m)   Wt 175 lb 2 oz (79.4 kg)   BMI 25.13 kg/m  Wt Readings from Last 3 Encounters:  12/14/20 175 lb 2 oz (79.4 kg)  12/02/20 176 lb (79.8 kg)  11/10/20 176 lb (79.8 kg)     Health Maintenance Due  Topic Date Due   Zoster Vaccines- Shingrix (1 of 2) Never done    There are no preventive care reminders to display for this patient.  Lab Results  Component Value Date   TSH 2.930 07/06/2020   Lab Results  Component Value Date   WBC 8.3 12/02/2020   HGB 14.8 12/02/2020   HCT 46.7 12/02/2020   MCV 87.5 12/02/2020   PLT 200 12/02/2020   Lab Results  Component Value Date   NA 134 (L) 12/02/2020   K 3.9 12/02/2020   CO2 23 12/02/2020   GLUCOSE 105 (H) 12/02/2020   BUN 18 12/02/2020   CREATININE 1.11 12/02/2020   BILITOT 0.8 12/02/2020   ALKPHOS 58 12/02/2020   AST 19 12/02/2020   ALT 10 12/02/2020   PROT 7.3 12/02/2020   ALBUMIN 4.0 12/02/2020   CALCIUM 8.5 (  L) 12/02/2020   ANIONGAP 9 12/02/2020   EGFR 76 09/15/2020   Lab Results  Component Value Date   CHOL 184 11/14/2019   Lab Results  Component Value Date   HDL 70 11/14/2019   Lab Results  Component Value Date   LDLCALC 98 11/14/2019   Lab Results  Component Value Date   TRIG 87  11/14/2019   Lab Results  Component Value Date   CHOLHDL 2.6 11/14/2019   Lab Results  Component Value Date   HGBA1C 5.4 03/09/2020      Assessment & Plan:   Problem List Items Addressed This Visit       Other   COVID-19 virus detected    Patient is following up for COVID-19 pain infection in the last 14 days.  Patient is stable with no new signs and symptoms of infection.  Except for mild cough and postnasal drip. Advised patient to follow-up with worsening symptoms.  Patient provided printed handouts given to patient.      Hospital discharge follow-up - Primary    Patient is following up after admission in the hospital for COVID-19 14 days ago.  Symptoms resolved except for mild cough and postnasal drip.  Completed hospital discharge instructions with medication reconciliation, patient verbalized understanding.  Follow-up with worsening or unresolved symptoms.       No orders of the defined types were placed in this encounter.   Follow-up: Return if symptoms worsen or fail to improve.    Ivy Lynn, NP

## 2020-12-14 NOTE — Assessment & Plan Note (Signed)
Patient is following up after admission in the hospital for COVID-19 14 days ago.  Symptoms resolved except for mild cough and postnasal drip.  Completed hospital discharge instructions with medication reconciliation, patient verbalized understanding.  Follow-up with worsening or unresolved symptoms.

## 2020-12-14 NOTE — Patient Instructions (Signed)
10 Things You Can Do to Manage Your COVID-19 Symptoms at Home If you have possible or confirmed COVID-19 Stay home except to get medical care. Monitor your symptoms carefully. If your symptoms get worse, call your healthcare provider immediately. Get rest and stay hydrated. If you have a medical appointment, call the healthcare provider ahead of time and tell them that you have or may have COVID-19. For medical emergencies, call 911 and notify the dispatch personnel that you have or may have COVID-19. Cover your cough and sneezes with a tissue or use the inside of your elbow. Wash your hands often with soap and water for at least 20 seconds or clean your hands with an alcohol-based hand sanitizer that contains at least 60% alcohol. As much as possible, stay in a specific room and away from other people in your home. Also, you should use a separate bathroom, if available. If you need to be around other people in or outside of the home, wear a mask. Avoid sharing personal items with other people in your household, like dishes, towels, and bedding. Clean all surfaces that are touched often, like counters, tabletops, and doorknobs. Use household cleaning sprays or wipes according to the label instructions. cdc.gov/coronavirus 10/18/2019 This information is not intended to replace advice given to you by your health care provider. Make sure you discuss any questions you have with your health care provider. Document Revised: 08/06/2020 Document Reviewed: 08/06/2020 Elsevier Patient Education  2022 Elsevier Inc.  

## 2020-12-14 NOTE — Assessment & Plan Note (Signed)
Patient is following up for COVID-19 pain infection in the last 14 days.  Patient is stable with no new signs and symptoms of infection.  Except for mild cough and postnasal drip. Advised patient to follow-up with worsening symptoms.  Patient provided printed handouts given to patient.

## 2020-12-17 ENCOUNTER — Encounter (HOSPITAL_BASED_OUTPATIENT_CLINIC_OR_DEPARTMENT_OTHER): Payer: Medicare Other | Admitting: Cardiovascular Disease

## 2021-01-07 ENCOUNTER — Ambulatory Visit: Payer: Medicare Other

## 2021-01-07 ENCOUNTER — Encounter: Payer: Self-pay | Admitting: Family Medicine

## 2021-01-07 ENCOUNTER — Other Ambulatory Visit: Payer: Self-pay

## 2021-01-07 ENCOUNTER — Ambulatory Visit (INDEPENDENT_AMBULATORY_CARE_PROVIDER_SITE_OTHER): Payer: Medicare Other | Admitting: Family Medicine

## 2021-01-07 VITALS — BP 128/77 | HR 79 | Temp 98.1°F | Ht 70.0 in | Wt 180.2 lb

## 2021-01-07 DIAGNOSIS — E7800 Pure hypercholesterolemia, unspecified: Secondary | ICD-10-CM

## 2021-01-07 DIAGNOSIS — Z9889 Other specified postprocedural states: Secondary | ICD-10-CM | POA: Diagnosis not present

## 2021-01-07 DIAGNOSIS — I71019 Dissection of thoracic aorta, unspecified: Secondary | ICD-10-CM

## 2021-01-07 DIAGNOSIS — I10 Essential (primary) hypertension: Secondary | ICD-10-CM

## 2021-01-07 DIAGNOSIS — Z5181 Encounter for therapeutic drug level monitoring: Secondary | ICD-10-CM

## 2021-01-07 DIAGNOSIS — E039 Hypothyroidism, unspecified: Secondary | ICD-10-CM

## 2021-01-07 DIAGNOSIS — Z125 Encounter for screening for malignant neoplasm of prostate: Secondary | ICD-10-CM

## 2021-01-07 LAB — POCT INR: INR: 2.6 (ref 2.0–3.0)

## 2021-01-07 NOTE — Progress Notes (Signed)
 BP 128/77   Pulse 79   Temp 98.1 F (36.7 C)   Ht 5' 10" (1.778 m)   Wt 180 lb 3.2 oz (81.7 kg)   SpO2 97%   BMI 25.86 kg/m    Subjective:   Patient ID: Patrick Bell, male    DOB: 07/09/1940, 80 y.o.   MRN: [id number]  HPI: Patrick Bell is a 80 y.o. male presenting on 01/07/2021 for Medical Management of Chronic Issues   HPI Hypothyroidism recheck Patient is coming in for thyroid recheck today as well. They deny any issues with hair changes or heat or cold problems or diarrhea or constipation. They deny any chest pain or palpitations. They are currently on levothyroxine 50 micrograms   Hyperlipidemia Patient is coming in for recheck of his hyperlipidemia. The patient is currently taking simvastatin. They deny any issues with myalgias or history of liver damage from it. They deny any focal numbness or weakness or chest pain.   Hypertension Patient is currently on metoprolol and amlodipine, and their blood pressure today is 128/77. Patient denies any lightheadedness or dizziness. Patient denies headaches, blurred vision, chest pains, shortness of breath, or weakness. Denies any side effects from medication and is content with current medication.   Right groin pain Patient is coming in with right groin pain.  He says he was bending over and felt something pop in 1 make sure it was not a hernia.  It does hurt on the inside of his right leg as well.  Likely pulled muscle and recommend stretching and exercise.  Relevant past medical, surgical, family and social history reviewed and updated as indicated. Interim medical history since our last visit reviewed. Allergies and medications reviewed and updated.  Review of Systems  Constitutional:  Negative for chills and fever.  Eyes:  Negative for visual disturbance.  Respiratory:  Negative for shortness of breath and wheezing.   Cardiovascular:  Negative for chest pain and leg swelling.  Musculoskeletal:  Positive for myalgias.  Negative for back pain and gait problem.  Skin:  Negative for rash.  Neurological:  Negative for dizziness, weakness and light-headedness.  All other systems reviewed and are negative.  Per HPI unless specifically indicated above   Allergies as of 01/07/2021   No Known Allergies      Medication List        Accurate as of January 07, 2021 10:16 AM. If you have any questions, ask your nurse or doctor.          acetaminophen 325 MG tablet Commonly known as: TYLENOL Take 1-2 tablets (325-650 mg total) by mouth every 4 (four) hours as needed for mild pain.   albuterol 108 (90 Base) MCG/ACT inhaler Commonly known as: ProAir HFA Inhale 2 puffs into the lungs every 6 (six) hours as needed for wheezing.   amLODipine 2.5 MG tablet Commonly known as: NORVASC Take 1 tablet (2.5 mg total) by mouth daily.   guaiFENesin 600 MG 12 hr tablet Commonly known as: MUCINEX Take 600 mg by mouth every 4 (four) hours as needed for cough or to loosen phlegm.   levothyroxine 50 MCG tablet Commonly known as: SYNTHROID Take 1 tablet (50 mcg total) by mouth daily.   metoprolol succinate 50 MG 24 hr tablet Commonly known as: TOPROL-XL Take 1 tablet (50 mg total) by mouth daily. Take with or immediately following a meal.   montelukast 10 MG tablet Commonly known as: SINGULAIR Take 1 tablet (10 mg total) daily by mouth.     Olopatadine HCl 0.2 % Soln Place 1 drop into both eyes daily as needed (allergies).   pantoprazole 40 MG tablet Commonly known as: PROTONIX Take 1 tablet (40 mg total) daily by mouth.   simvastatin 10 MG tablet Commonly known as: ZOCOR Take 1 tablet (10 mg total) by mouth at bedtime.   warfarin 5 MG tablet Commonly known as: COUMADIN Take as directed by the anticoagulation clinic. If you are unsure how to take this medication, talk to your nurse or doctor. Original instructions: Take 1 tablet daily at 4 EVENING. What changed: See the new instructions.          Objective:   BP 128/77   Pulse 79   Temp 98.1 F (36.7 C)   Ht 5' 10" (1.778 m)   Wt 180 lb 3.2 oz (81.7 kg)   SpO2 97%   BMI 25.86 kg/m   Wt Readings from Last 3 Encounters:  01/07/21 180 lb 3.2 oz (81.7 kg)  12/14/20 175 lb 2 oz (79.4 kg)  12/02/20 176 lb (79.8 kg)    Physical Exam Vitals and nursing note reviewed.  Constitutional:      General: He is not in acute distress.    Appearance: He is well-developed. He is not diaphoretic.  Eyes:     General: No scleral icterus.    Conjunctiva/sclera: Conjunctivae normal.  Neck:     Thyroid: No thyromegaly.  Cardiovascular:     Rate and Rhythm: Normal rate and regular rhythm.     Heart sounds: Normal heart sounds. No murmur heard. Pulmonary:     Effort: Pulmonary effort is normal. No respiratory distress.     Breath sounds: Normal breath sounds. No wheezing.  Musculoskeletal:        General: Normal range of motion.     Cervical back: Neck supple.       Legs:  Lymphadenopathy:     Cervical: No cervical adenopathy.  Skin:    General: Skin is warm and dry.     Findings: No rash.  Neurological:     Mental Status: He is alert and oriented to person, place, and time.     Coordination: Coordination normal.  Psychiatric:        Behavior: Behavior normal.    Results for orders placed or performed in visit on 12/10/20  POCT INR  Result Value Ref Range   INR 3.9 (A) 2.0 - 3.0    Assessment & Plan:   Problem List Items Addressed This Visit       Cardiovascular and Mediastinum   Essential hypertension, benign   Relevant Orders   CBC with Differential/Platelet   CMP14+EGFR     Endocrine   Hypothyroidism - Primary   Relevant Orders   TSH + free T4     Other   Hyperlipidemia   Relevant Orders   Lipid panel   Other Visit Diagnoses     Prostate cancer screening       Relevant Orders   PSA, total and free       Continue current medicine seems to be doing well. Follow up plan: Return in about 6  months (around 07/08/2021), or if symptoms worsen or fail to improve, for Hypertension hypothyroidism hyperlipidemia.  Counseling provided for all of the vaccine components Orders Placed This Encounter  Procedures   CBC with Differential/Platelet   CMP14+EGFR   Lipid panel   PSA, total and free   TSH + free T4    Joshua Dettinger, MD [facility]   Family Medicine 01/07/2021, 10:16 AM

## 2021-01-07 NOTE — Patient Instructions (Signed)
-   resume taking 1 tablet daily except 1/2 tablet each Sunday.   - Repeat INR in 6 weeks

## 2021-01-08 ENCOUNTER — Other Ambulatory Visit: Payer: Self-pay | Admitting: Family Medicine

## 2021-01-08 LAB — CMP14+EGFR
ALT: 7 IU/L (ref 0–44)
AST: 15 IU/L (ref 0–40)
Albumin/Globulin Ratio: 2 (ref 1.2–2.2)
Albumin: 4.2 g/dL (ref 3.6–4.6)
Alkaline Phosphatase: 68 IU/L (ref 44–121)
BUN/Creatinine Ratio: 11 (ref 10–24)
BUN: 12 mg/dL (ref 8–27)
Bilirubin Total: 0.4 mg/dL (ref 0.0–1.2)
CO2: 24 mmol/L (ref 20–29)
Calcium: 9.1 mg/dL (ref 8.6–10.2)
Chloride: 104 mmol/L (ref 96–106)
Creatinine, Ser: 1.05 mg/dL (ref 0.76–1.27)
Globulin, Total: 2.1 g/dL (ref 1.5–4.5)
Glucose: 85 mg/dL (ref 70–99)
Potassium: 4.3 mmol/L (ref 3.5–5.2)
Sodium: 142 mmol/L (ref 134–144)
Total Protein: 6.3 g/dL (ref 6.0–8.5)
eGFR: 71 mL/min/{1.73_m2} (ref >59)

## 2021-01-08 LAB — CBC WITH DIFFERENTIAL/PLATELET
Basophils Absolute: 0.1 10*3/uL (ref 0.0–0.2)
Basos: 2 %
EOS (ABSOLUTE): 0.1 10*3/uL (ref 0.0–0.4)
Eos: 2 %
Hematocrit: 43.1 % (ref 37.5–51.0)
Hemoglobin: 14.1 g/dL (ref 13.0–17.7)
Immature Grans (Abs): 0 10*3/uL (ref 0.0–0.1)
Immature Granulocytes: 1 %
Lymphocytes Absolute: 1 10*3/uL (ref 0.7–3.1)
Lymphs: 20 %
MCH: 27.6 pg (ref 26.6–33.0)
MCHC: 32.7 g/dL (ref 31.5–35.7)
MCV: 84 fL (ref 79–97)
Monocytes Absolute: 0.5 10*3/uL (ref 0.1–0.9)
Monocytes: 11 %
Neutrophils Absolute: 3 10*3/uL (ref 1.4–7.0)
Neutrophils: 64 %
Platelets: 225 10*3/uL (ref 150–450)
RBC: 5.11 x10E6/uL (ref 4.14–5.80)
RDW: 14.4 % (ref 11.6–15.4)
WBC: 4.7 10*3/uL (ref 3.4–10.8)

## 2021-01-08 LAB — PSA, TOTAL AND FREE
PSA, Free Pct: 13.3 %
PSA, Free: 0.93 ng/mL
Prostate Specific Ag, Serum: 7 ng/mL — ABNORMAL HIGH (ref 0.0–4.0)

## 2021-01-08 LAB — LIPID PANEL
Chol/HDL Ratio: 2.6 ratio (ref 0.0–5.0)
Cholesterol, Total: 193 mg/dL (ref 100–199)
HDL: 74 mg/dL (ref >39)
LDL Chol Calc (NIH): 107 mg/dL — ABNORMAL HIGH (ref 0–99)
Triglycerides: 63 mg/dL (ref 0–149)
VLDL Cholesterol Cal: 12 mg/dL (ref 5–40)

## 2021-01-08 LAB — TSH+FREE T4
Free T4: 1.67 ng/dL (ref 0.82–1.77)
TSH: 0.824 u[IU]/mL (ref 0.450–4.500)

## 2021-01-11 ENCOUNTER — Other Ambulatory Visit: Payer: Self-pay | Admitting: Family Medicine

## 2021-01-15 ENCOUNTER — Telehealth: Payer: Self-pay | Admitting: Family Medicine

## 2021-01-18 ENCOUNTER — Other Ambulatory Visit: Payer: Self-pay | Admitting: Family Medicine

## 2021-01-18 DIAGNOSIS — R972 Elevated prostate specific antigen [PSA]: Secondary | ICD-10-CM

## 2021-01-18 NOTE — Progress Notes (Signed)
Placed referral to urology.

## 2021-01-21 ENCOUNTER — Ambulatory Visit (INDEPENDENT_AMBULATORY_CARE_PROVIDER_SITE_OTHER): Payer: Medicare Other | Admitting: Family

## 2021-01-21 ENCOUNTER — Encounter: Payer: Self-pay | Admitting: Family

## 2021-01-21 DIAGNOSIS — Z1152 Encounter for screening for COVID-19: Secondary | ICD-10-CM | POA: Diagnosis not present

## 2021-01-21 NOTE — Progress Notes (Signed)
   Virtual Visit  Note Due to COVID-19 pandemic this visit was conducted virtually. This visit type was conducted due to national recommendations for restrictions regarding the COVID-19 Pandemic (e.g. social distancing, sheltering in place) in an effort to limit this patient's exposure and mitigate transmission in our community. All issues noted in this document were discussed and addressed.  A physical exam was not performed with this format.  I connected with [name] on 01/21/21 at 8:16 AM  by telephone and verified that I am speaking with the correct person using two identifiers. [name] is currently located at car and no one is currently with him  during visit. The provider, Evelina Dun, FNP is located in their office at time of visit.  I discussed the limitations, risks, security and privacy concerns of performing an evaluation and management service by telephone and the availability of in person appointments. I also discussed with the patient that there may be a patient responsible charge related to this service. The patient expressed understanding and agreed to proceed.   History and Present Illness:  HPI PT calls the office today requesting a COVID test. He reports he has a sleep study scheduled for next week and has to get a COVID test to rule out before his procedure. Denies any fever, cough, or congestion.   Review of Systems  All other systems reviewed and are negative.   Observations/Objective: No SOB or distress noted   Assessment and Plan: 1. Encounter for screening for COVID-19 COVID test pending  Social distance until procedure  Report any s/s of infection  - Novel Coronavirus, NAA ([facility])  I discussed the assessment and treatment plan with the patient. The patient was provided an opportunity to ask questions and all were answered. The patient agreed with the plan and demonstrated an understanding of the instructions.   The patient was advised to  call back or seek an in-person evaluation if the symptoms worsen or if the condition fails to improve as anticipated.  The above assessment and management plan was discussed with the patient. The patient verbalized understanding of and has agreed to the management plan. Patient is aware to call the clinic if symptoms persist or worsen. Patient is aware when to return to the clinic for a follow-up visit. Patient educated on when it is appropriate to go to the emergency department.   Time call ended:  8:27 Am   I provided 11 minutes of  non face-to-face time during this encounter.    Evelina Dun, FNP

## 2021-01-22 LAB — NOVEL CORONAVIRUS, NAA: SARS-CoV-2, NAA: NOT DETECTED

## 2021-01-22 LAB — SARS-COV-2, NAA 2 DAY TAT

## 2021-01-26 ENCOUNTER — Ambulatory Visit (HOSPITAL_BASED_OUTPATIENT_CLINIC_OR_DEPARTMENT_OTHER): Payer: Medicare Other | Attending: Cardiovascular Disease | Admitting: Cardiovascular Disease

## 2021-01-26 ENCOUNTER — Other Ambulatory Visit: Payer: Self-pay

## 2021-01-26 DIAGNOSIS — G4733 Obstructive sleep apnea (adult) (pediatric): Secondary | ICD-10-CM | POA: Insufficient documentation

## 2021-01-26 DIAGNOSIS — Z7901 Long term (current) use of anticoagulants: Secondary | ICD-10-CM

## 2021-02-03 DIAGNOSIS — L570 Actinic keratosis: Secondary | ICD-10-CM | POA: Diagnosis not present

## 2021-02-03 DIAGNOSIS — D044 Carcinoma in situ of skin of scalp and neck: Secondary | ICD-10-CM | POA: Diagnosis not present

## 2021-02-03 DIAGNOSIS — D0462 Carcinoma in situ of skin of left upper limb, including shoulder: Secondary | ICD-10-CM | POA: Diagnosis not present

## 2021-02-04 ENCOUNTER — Telehealth: Payer: Self-pay | Admitting: *Deleted

## 2021-02-04 NOTE — Telephone Encounter (Signed)
MyChart message sent to the patient to bring his CPAP card in when he comes in to the coumadin clinic on 11/17 for download.

## 2021-02-04 NOTE — Telephone Encounter (Signed)
-----   Message from [name], [place] sent at 07/14/2020 10:01 AM EDT ----- Patrick Bell

## 2021-02-05 ENCOUNTER — Ambulatory Visit (INDEPENDENT_AMBULATORY_CARE_PROVIDER_SITE_OTHER): Payer: Medicare Other

## 2021-02-05 ENCOUNTER — Other Ambulatory Visit: Payer: Self-pay

## 2021-02-05 DIAGNOSIS — Z23 Encounter for immunization: Secondary | ICD-10-CM | POA: Diagnosis not present

## 2021-02-12 ENCOUNTER — Other Ambulatory Visit: Payer: Self-pay | Admitting: Surgery

## 2021-02-12 DIAGNOSIS — Z9889 Other specified postprocedural states: Secondary | ICD-10-CM

## 2021-02-12 DIAGNOSIS — I7101 Dissection of ascending aorta: Secondary | ICD-10-CM

## 2021-02-15 ENCOUNTER — Encounter (HOSPITAL_BASED_OUTPATIENT_CLINIC_OR_DEPARTMENT_OTHER): Payer: Self-pay | Admitting: Cardiovascular Disease

## 2021-02-15 NOTE — Procedures (Signed)
Patient Name: Patrick, Bell Date: 01/26/2021 Gender: Male D.O.B: 11-12-1940 Age (years): 8 Referring Provider: Shelva Majestic MD, ABSM Height (inches): 70 Interpreting Physician: Shelva Majestic MD, ABSM Weight (lbs): 180 RPSGT: Zadie Rhine BMI: 26 MRN: [id number] Neck Size: 16.00  CLINICAL INFORMATION Sleep Study Type: Split Night CPAP  Indication for sleep study: Fatigue, Snoring  Epworth Sleepiness Score: 18  SLEEP STUDY TECHNIQUE As per the AASM Manual for the Scoring of Sleep and Associated Events v2.3 (April 2016) with a hypopnea requiring 4% desaturations.  The channels recorded and monitored were frontal, central and occipital EEG, electrooculogram (EOG), submentalis EMG (chin), nasal and oral airflow, thoracic and abdominal wall motion, anterior tibialis EMG, snore microphone, electrocardiogram, and pulse oximetry. Continuous positive airway pressure (CPAP) was initiated when the patient met split night criteria and was titrated according to treat sleep-disordered breathing.  MEDICATIONS acetaminophen (TYLENOL) 325 MG tablet albuterol (PROAIR HFA) 108 (90 Base) MCG/ACT inhaler amLODipine (NORVASC) 2.5 MG tablet guaiFENesin (MUCINEX) 600 MG 12 hr tablet levothyroxine (SYNTHROID) 50 MCG tablet metoprolol succinate (TOPROL-XL) 50 MG 24 hr tablet (Expired) montelukast (SINGULAIR) 10 MG tablet Olopatadine HCl 0.2 % SOLN pantoprazole (PROTONIX) 40 MG tablet simvastatin (ZOCOR) 10 MG tablet warfarin (COUMADIN) 5 MG tablet  Medications self-administered by patient taken the night of the study : N/A  RESPIRATORY PARAMETERS Diagnostic Total AHI (/hr): 25.3 RDI (/hr): 29.8 OA Index (/hr): 0.4 CA Index (/hr): 0.0 REM AHI (/hr): 52.7 NREM AHI (/hr): 20.4 Supine AHI (/hr): 35.3 Non-supine AHI (/hr): 0 Min O2 Sat (%): 82.0 Mean O2 (%): 91.9 Time below 88% (min): 8.4   Titration Optimal Pressure (cm): 18 AHI at Optimal Pressure (/hr): 0 Min O2 at Optimal  Pressure (%): 95.0 Supine % at Optimal (%): 0 Sleep % at Optimal (%): 62   SLEEP ARCHITECTURE The recording time for the entire night was 413.4 minutes.  During a baseline period of 167.0 minutes, the patient slept for 135.0 minutes in REM and nonREM, yielding a sleep efficiency of 80.8%%. Sleep onset after lights out was 15.2 minutes with a REM latency of 84.5 minutes. The patient spent 7.8%% of the night in stage N1 sleep, 77.0%% in stage N2 sleep, 0.0%% in stage N3 and 15.2% in REM.  During the titration period of 240.9 minutes, the patient slept for 185.0 minutes in REM and nonREM, yielding a sleep efficiency of 76.8%%. Sleep onset after CPAP initiation was 38.1 minutes with a REM latency of 15.5 minutes. The patient spent 3.5%% of the night in stage N1 sleep, 48.6%% in stage N2 sleep, 0.0%% in stage N3 and 47.8% in REM.  CARDIAC DATA The 2 lead EKG demonstrated sinus rhythm. The mean heart rate was 100.0 beats per minute. Other EKG findings include: None.  LEG MOVEMENT DATA The total Periodic Limb Movements of Sleep (PLMS) were 0. The PLMS index was 0.0 .  IMPRESSIONS - Moderate obstructive sleep apnea overall during the diagnostic portion of the study (AHI 25.3/h; RDI 29.8/h); however, sleep apnea was severe with supine sleep (AHI 35.3) and during REM sleep (AHI 52.7/h).   CPAP was initiated at 6 cm and was titrated to 18 cm of water; AHI 0, O2 nadir 95. - No significant central sleep apnea occurred during the diagnostic portion of the study (CAI  0.0/hour). - Moderate oxygen desaturation was noted during the diagnostic study to a nadir of 82%. - Snoring was audible during the diagnostic portion of the study.  - No cardiac abnormalities were noted  during this study. - Clinically significant periodic limb movements did not occur during sleep.  DIAGNOSIS - Obstructive Sleep Apnea (G47.33)  RECOMMENDATIONS - Recommend an initial trial of CPAP Auto with EPR of 3 at 17 - 20 cm H2O with  heated humidification. A Large size [employer]kel Full Face Mask Simplus mask was used for the titration.  - Effort should be made to optimize nasal and oropharyngeal patency.  - Avoid alcohol, sedatives and other CNS depressants that may worsen sleep apnea and disrupt normal sleep architecture. - Sleep hygiene should be reviewed to assess factors that may improve sleep quality. - Weight management and regular exercise should be initiated or continued. - Recommend a download and sleep clinic evaluation after 4 weeks of therapy.  [Electronically signed] 02/15/2021 06:56 AM  Thomas Kelly MD, FACC, ABSM Diplomate, American Board of Sleep Medicine   NPI: [id number]  [facility] PH: [phone]   FX: [phone] ACCREDITED BY THE AMERICAN ACADEMY OF SLEEP MEDICINE  

## 2021-02-17 ENCOUNTER — Telehealth: Payer: Self-pay | Admitting: *Deleted

## 2021-02-17 NOTE — Telephone Encounter (Signed)
-----   Message from Troy Sine, MD sent at 02/15/2021  7:02 AM EST ----- Mariann Laster, please notify patient and set up with DME company to initiate CPAP therapy

## 2021-02-17 NOTE — Telephone Encounter (Signed)
Spoke with patient to give him his sleep study results and recommendations. He has a INR appointment tomorrow and would like to discuss whether he even wants to restart his therapy. I asked hat he also bring in his machine so that if he wants to do so I can possibly set the machine to the pressures currently ordered by Dr Claiborne Billings until he can get his new machine.

## 2021-02-18 ENCOUNTER — Telehealth: Payer: Self-pay | Admitting: *Deleted

## 2021-02-18 ENCOUNTER — Other Ambulatory Visit: Payer: Self-pay

## 2021-02-18 ENCOUNTER — Ambulatory Visit: Payer: Medicare Other

## 2021-02-18 DIAGNOSIS — I71019 Dissection of thoracic aorta, unspecified: Secondary | ICD-10-CM

## 2021-02-18 DIAGNOSIS — Z9889 Other specified postprocedural states: Secondary | ICD-10-CM | POA: Diagnosis not present

## 2021-02-18 DIAGNOSIS — Z5181 Encounter for therapeutic drug level monitoring: Secondary | ICD-10-CM | POA: Diagnosis not present

## 2021-02-18 LAB — POCT INR: INR: 2.3 (ref 2.0–3.0)

## 2021-02-18 NOTE — Patient Instructions (Signed)
-   resume taking 1 tablet daily except 1/2 tablet each Sunday.   - Repeat INR in 8 weeks

## 2021-02-18 NOTE — Telephone Encounter (Signed)
Patient came into the office today and we went over his sleep study results. After visibly seeing the report and discussing it in person he now understands why he needs to continue his therapy. He currently has a [employer] model that is now not able to transmit data due to the 5G upgrade. I manually set the pressures to the  requested settings per Dr Evette Georges sleep report. Patient was also given a new mask. I am sending order for his new machine to [facility]. Patient does not want to go back to [facility].

## 2021-03-03 DIAGNOSIS — R8271 Bacteriuria: Secondary | ICD-10-CM | POA: Diagnosis not present

## 2021-03-03 DIAGNOSIS — R3915 Urgency of urination: Secondary | ICD-10-CM | POA: Diagnosis not present

## 2021-03-23 ENCOUNTER — Ambulatory Visit: Payer: Medicare Other | Admitting: Physician Assistant

## 2021-03-23 ENCOUNTER — Ambulatory Visit
Admission: RE | Admit: 2021-03-23 | Discharge: 2021-03-23 | Disposition: A | Discharge status: Home / Self Care | Payer: Medicare Other | Source: Ambulatory Visit | Attending: Surgery | Admitting: Surgery

## 2021-03-23 ENCOUNTER — Other Ambulatory Visit: Payer: Self-pay | Admitting: Surgery

## 2021-03-23 ENCOUNTER — Other Ambulatory Visit: Payer: Medicare Other

## 2021-03-23 ENCOUNTER — Other Ambulatory Visit: Payer: Self-pay

## 2021-03-23 VITALS — BP 154/83 | HR 87 | Resp 20 | Ht 70.0 in | Wt 180.0 lb

## 2021-03-23 DIAGNOSIS — I2510 Atherosclerotic heart disease of native coronary artery without angina pectoris: Secondary | ICD-10-CM | POA: Diagnosis not present

## 2021-03-23 DIAGNOSIS — Z9889 Other specified postprocedural states: Secondary | ICD-10-CM

## 2021-03-23 DIAGNOSIS — K5730 Diverticulosis of large intestine without perforation or abscess without bleeding: Secondary | ICD-10-CM | POA: Diagnosis not present

## 2021-03-23 MED ORDER — IOPAMIDOL (ISOVUE-370) INJECTION 76%
75.0000 mL | Freq: Once | INTRAVENOUS | Status: AC | PRN
  Administered 2021-03-23: 13:00:00 75 mL via INTRAVENOUS

## 2021-03-23 NOTE — Patient Instructions (Signed)
Continue to avoid any strenuous activity until cleared by our office. Okay to continue walking 20 to 30 minutes a day as desired.  We will arrange for an echocardiogram and subsequent follow-up in our office with one of the surgeons.

## 2021-03-23 NOTE — Progress Notes (Signed)
[address]       [place] [address]             [phone]       HPI: [name] is an 80 year old with a history of hypertension, dyslipidemia, obstructive sleep apnea, gastroesophageal reflux disease, and tobacco abuse.  He was referred to Dr Roxy Manns last year for surgical management of mitral valve insufficiency.  While undergoing minimally invasive mitral valve repair on 03/11/2020, he developed an acute aortic dissection after initiation of cardiopulmonary bypass.  The patient was converted to sternotomy and repair of the dissection was accomplished with resuspension of the native aortic valve and replacement of the ascending aorta using a supra coronary straight graft.  The mitral valve was also repaired using standard techniques.  The patient's postoperative course was significant for development of a pericardial effusion requiring suggesting pericardial window in the postoperative phase.  He also developed atrial fibrillation postoperatively.  He was discharged to inpatient rehab and eventually to home.  Follow-up echo obtained in February of this year showed stable function of the mitral valve post repair with minimal regurgitation.  He also had a follow-up CTA in June of this year and straining stable chronic dissection of the ascending aorta, aortic arch, ascending aorta, abdominal aorta.    [name] returns for scheduled follow-up after having repeat CTA of the chest abdomen and pelvis today. He reports having [facility] spotted fever after multiple tick bites in April of this year.  A few months later, he had COVID-19.  He is concerned that he is becoming more and more short of breath with minimal activity.  He has also had several episodes of dizziness when going up and down steps in his house.  He strongly believes that this is most likely due to deconditioning since he is not being very active since his surgery a year ago and is asking to resume an exercise  program.  He has been carefully adhering to Dr. Guy Sandifer advice to avoid strenuous activity over the past year.   Current Outpatient Medications  Medication Sig Dispense Refill   acetaminophen (TYLENOL) 325 MG tablet Take 1-2 tablets (325-650 mg total) by mouth every 4 (four) hours as needed for mild pain.     albuterol (PROAIR HFA) 108 (90 Base) MCG/ACT inhaler Inhale 2 puffs into the lungs every 6 (six) hours as needed for wheezing. 8.5 g 2   amLODipine (NORVASC) 2.5 MG tablet Take 1 tablet (2.5 mg total) by mouth daily. 90 tablet 3   guaiFENesin (MUCINEX) 600 MG 12 hr tablet Take 600 mg by mouth every 4 (four) hours as needed for cough or to loosen phlegm.     levothyroxine (SYNTHROID) 50 MCG tablet Take 1 tablet (50 mcg total) by mouth daily. 90 tablet 3   metoprolol succinate (TOPROL-XL) 50 MG 24 hr tablet Take 1 tablet (50 mg total) by mouth daily. Take with or immediately following a meal. 90 tablet 3   montelukast (SINGULAIR) 10 MG tablet Take 1 tablet (10 mg total) daily by mouth. 90 tablet 3   Olopatadine HCl 0.2 % SOLN Place 1 drop into both eyes daily as needed (allergies).     pantoprazole (PROTONIX) 40 MG tablet Take 1 tablet (40 mg total) daily by mouth. 90 tablet 3   simvastatin (ZOCOR) 10 MG tablet Take 1 tablet (10 mg total) by mouth at bedtime. 90 tablet 3   warfarin (COUMADIN) 5 MG tablet TAKE ONE  TABLET ONCE DAILY AT 4PM 90 tablet 0   No current facility-administered medications for this visit.    Physical Exam:  Vital signs BP 154/83 Pulse 87 Respirations 20 SPO2 97% on room air   Diagnostic Tests: CLINICAL DATA:  Follow up thoracic aortic dissection. Surgical repair of the ascending aorta 03/11/2020.   EXAM: CT ANGIOGRAPHY CHEST, ABDOMEN AND PELVIS   TECHNIQUE: Non-contrast CT of the chest was initially obtained.   Multidetector CT imaging through the chest, abdomen and pelvis was performed using the standard protocol during bolus administration  of intravenous contrast. Multiplanar reconstructed images and MIPs were obtained and reviewed to evaluate the vascular anatomy.   CONTRAST:  66mL ISOVUE-370 IOPAMIDOL (ISOVUE-370) INJECTION 76%   COMPARISON:  Prior CT 09/10/2020   FINDINGS: CTA CHEST FINDINGS   Cardiovascular: Stable postsurgical changes related to repair of the ascending thoracic aorta and mitral valve replacement. The underlying aortic dissection appears unchanged, extending from just proximal to the innominate artery origin into the abdominal aorta. Dissection flap extends into the left subclavian artery as before. The left vertebral artery and additional great vessels are patent. The true lumen within the aortic arch and descending aorta demonstrates preferential enhancement and is unchanged in size. There is no progressive aortic dilatation or evidence of mediastinal hematoma. Underlying mild coronary artery atherosclerosis noted.   Mediastinum/Nodes: There are no enlarged mediastinal, hilar or axillary lymph nodes. Small mediastinal lymph nodes are unchanged. There is no mediastinal hematoma. The thyroid gland, trachea and esophagus demonstrate no significant findings.   Lungs/Pleura: No pleural effusion or pneumothorax. Underlying mild centrilobular emphysema with scattered subpleural reticulation and scarring. Stable 7 mm right middle lobe nodule on image 124/7. No new or enlarging nodules are identified.   Musculoskeletal: No acute or suspicious osseous findings. Stable appearance of the median sternotomy. Mild thoracic spondylosis.   Review of the MIP images confirms the above findings.   CTA ABDOMEN AND PELVIS FINDINGS   VASCULAR   Aorta: Stable inferior extension of the aortic dissection flap into abdominal aorta, and both common and external iliac arteries. Stable size of the true lumen which remains mildly compressed and located on the left within the upper abdomen and posteriorly below the  renal arteries. There is persistent opacification of both lumens. No evidence of abdominal aortic aneurysm.   Celiac: Patent without evidence of aneurysm, dissection, vasculitis or significant stenosis.   SMA: Extension of the dissection flap into the superior mesenteric artery is no longer visualized. There is mild luminal narrowing of the proximal SMA without significant stenosis.   Renals: Improvement in the previously demonstrated extension of the dissection into both renal arteries. No significant vascular stenosis.   IMA: Remains patent, arising from the false lumen.   Inflow: Stable extension of the dissection flap into both common and external iliac arteries. The flap extends into the left common femoral artery. No evidence of large vessel occlusion or retroperitoneal hemorrhage.   Veins: Limited venous opacification without acute abnormality.   Review of the MIP images confirms the above findings.   NON-VASCULAR   Hepatobiliary: No focal liver abnormality is seen. No gallstones, gallbladder wall thickening, or biliary dilatation.   Pancreas: Unremarkable. No pancreatic ductal dilatation or surrounding inflammatory changes.   Spleen: Normal in size without focal abnormality.   Adrenals/Urinary Tract: Both adrenal glands appear normal. No evidence of renal mass, urinary tract calculus or hydronephrosis. The bladder appears unremarkable.   Stomach/Bowel: No enteric contrast administered. The stomach appears normal for its degree  of distention. No evidence of bowel wall thickening, distention or surrounding inflammation. Diffuse diverticular changes are again noted throughout the descending and sigmoid colon. The appendix appears normal.   Lymphatic: No enlarged abdominopelvic lymph nodes.   Reproductive: Stable mild enlargement of the prostate gland. The seminal vesicles appear unremarkable.   Other: Intact abdominal wall. No ascites, free air or  focal extraluminal fluid collection.   Musculoskeletal: No acute osseous findings. Stable degenerative changes in the spine associated with a convex left lumbar scoliosis.   Review of the MIP images confirms the above findings.   IMPRESSION: 1. Stable extent of the residual thoracic aortic dissection status post tube graft repair of the ascending aorta. No progressive aortic dilatation or evidence of acute hemorrhage. 2. Extension of the dissection flap into the superior mesenteric and bilateral renal arteries has improved compared with the prior study. The distal extension into the bilateral iliac and left common femoral arteries is unchanged. No evidence of large vessel occlusion. 3. Stable heart size without pericardial effusion. 4. Diffuse distal colonic diverticulosis, as before.     Electronically Signed   By: Richardean Sale M.D.   On: 03/23/2021 15:08    Impression / Plan: 80 year old male now 1 year status post mitral valve repair complicated by intraoperative ascending aortic dissection that was repaired with a supra coronary straight graft.  [name] presented today for scheduled follow-up after repeat CTA to reevaluate the dissection.  He has had an episode of [facility] spotted fever as well as COVID infection this past year.  He describes worsening dyspnea on exertion and dizziness with going up and down steps.  He believes this is mainly due to deconditioning since he is not been ill active in the past 12 months due to restrictions recommended to him following the aortic dissection.  He is very eager to begin an an exercise program which he is convinced will lead to resolution of his shortness and breath dizziness.  I explained would not be comfortable releasing him to advance activity to that degree without evaluating his valvular function with an echocardiogram.  CTA shows stability of the dissection with no new areas of dilation or hemorrhage.  Flow through the  superior mesenteric and renal arteries has improved.  His CT angiogram had not resulted while he waited here in the office for over an hour.  For this reason, I recommended he proceed with an echocardiogram and return to the office once that has been accomplished and allow Dr. Cyndia Bent to review the studies with him.  He and his wife agreed to this plan.  Antony Odea, PA-C [facility] [phone]

## 2021-04-05 ENCOUNTER — Other Ambulatory Visit (HOSPITAL_BASED_OUTPATIENT_CLINIC_OR_DEPARTMENT_OTHER): Payer: Self-pay | Admitting: Cardiovascular Disease

## 2021-04-05 DIAGNOSIS — E782 Mixed hyperlipidemia: Secondary | ICD-10-CM

## 2021-04-15 ENCOUNTER — Ambulatory Visit (HOSPITAL_COMMUNITY): Payer: Medicare Other | Attending: Cardiovascular Disease

## 2021-04-15 ENCOUNTER — Other Ambulatory Visit: Payer: Self-pay

## 2021-04-15 ENCOUNTER — Ambulatory Visit: Payer: Medicare Other

## 2021-04-15 DIAGNOSIS — Z954 Presence of other heart-valve replacement: Secondary | ICD-10-CM

## 2021-04-15 DIAGNOSIS — I119 Hypertensive heart disease without heart failure: Secondary | ICD-10-CM | POA: Insufficient documentation

## 2021-04-15 DIAGNOSIS — I71019 Dissection of thoracic aorta, unspecified: Secondary | ICD-10-CM

## 2021-04-15 DIAGNOSIS — I351 Nonrheumatic aortic (valve) insufficiency: Secondary | ICD-10-CM | POA: Diagnosis not present

## 2021-04-15 DIAGNOSIS — Z87891 Personal history of nicotine dependence: Secondary | ICD-10-CM | POA: Diagnosis not present

## 2021-04-15 DIAGNOSIS — Z5181 Encounter for therapeutic drug level monitoring: Secondary | ICD-10-CM | POA: Diagnosis not present

## 2021-04-15 DIAGNOSIS — R5383 Other fatigue: Secondary | ICD-10-CM | POA: Insufficient documentation

## 2021-04-15 DIAGNOSIS — Z9889 Other specified postprocedural states: Secondary | ICD-10-CM | POA: Diagnosis not present

## 2021-04-15 DIAGNOSIS — I2510 Atherosclerotic heart disease of native coronary artery without angina pectoris: Secondary | ICD-10-CM | POA: Diagnosis not present

## 2021-04-15 DIAGNOSIS — Z8249 Family history of ischemic heart disease and other diseases of the circulatory system: Secondary | ICD-10-CM | POA: Insufficient documentation

## 2021-04-15 DIAGNOSIS — E785 Hyperlipidemia, unspecified: Secondary | ICD-10-CM | POA: Diagnosis not present

## 2021-04-15 DIAGNOSIS — R42 Dizziness and giddiness: Secondary | ICD-10-CM | POA: Insufficient documentation

## 2021-04-15 DIAGNOSIS — G4730 Sleep apnea, unspecified: Secondary | ICD-10-CM | POA: Insufficient documentation

## 2021-04-15 DIAGNOSIS — R0602 Shortness of breath: Secondary | ICD-10-CM | POA: Diagnosis not present

## 2021-04-15 LAB — ECHOCARDIOGRAM COMPLETE
Area-P 1/2: 3.48 cm2
P 1/2 time: 595 msec
S' Lateral: 3.1 cm

## 2021-04-15 LAB — POCT INR: INR: 2.3 (ref 2.0–3.0)

## 2021-04-15 NOTE — Patient Instructions (Signed)
-   resume taking 1 tablet daily except 1/2 tablet each Sunday.   - Repeat INR in 8 weeks

## 2021-05-03 ENCOUNTER — Telehealth: Payer: Self-pay | Admitting: Cardiovascular Disease

## 2021-05-03 NOTE — Progress Notes (Signed)
Cardiology Office Note:    Date:  05/05/2021   ID:  Patrick Bell, DOB Aug 25, 1940, MRN [id number]  PCP:  Dettinger, Fransisca Kaufmann, MD   [facility] Providers Cardiologist:  Shelva Majestic, MD {  Referring MD: Dettinger, Fransisca Kaufmann, MD   Chief Complaint  Patient presents with   Follow-up    dizziness    History of Present Illness:    Patrick Bell is a 81 y.o. male with a hx of severe MR, PAF, HTN, HLD, OSA, and asthma. Echo 11/2019 with normal EF 60-65%, and normal RV function, severe MR wit moderate holosystolic prolapse of the middle scallop of the posterior leaflet of the mitral valve. TEE confirmed mitral valve prolapse wit flail segment involving a portion of the posterior leaflet and severe MR. CT surgery was consulted and he underwent right and left heart cath that showed normal coronary arteries. He underwent mitral valve repair 03/11/20 with Dr. Roxy Manns, which was complicated by an acute type A aortic dissection requiring graft repair of his aorta and resuspension of his native aortic valve. Hospital course further complicated by pericardial effusion requiring a window, Afib treated with amiodarone and coumadin, anemia requiring transfusion, AKI, hypotension, and a new diagnosis of hypothyroidism. He converted back to sinus rhythm after pericardial window. He was discharged to [facility] 03/31/20, discharged home 04/07/20. Follow up CT imaging with CT surgery showed persistent aortic dissection flap through the arch, descending thoracic, abdominal aorta, bilateral iliac arteries, and left common femoral artery.  The dissection flap involves bilateral subclavian arteries, celiac, SMA, and bilateral renal arteries.  Given chronic dissection, Dr. Claiborne Billings increased metoprolol to 50 mg daily.  He has a history of BP differential and had follow up CT imaging 09/2020 that showed stable dissection as above with flap involving the left subclavian artery superior mesenteric and both renal arteries.  He was last  seen in clinic by Dr. Claiborne Billings 10/06/20. Repeat CT imaging 03/2021 with stable chronic dissection. Pt has a history of self-adjusting or self-discontinuing medications (synthroid, coumadin, BB).  In response to pt self-discontinuing metoprolol: Dr. Claiborne Billings noted  I again have strongly urged that he not adjust his own medications.  With his extensive aortic dissection he is at significant risk for dissection progression off beta-blocker therapy.  I have resume metoprolol succinate 50 mg daily.    He called our office yesterday with reports of dizziness for the last 2 weeks. He stopped his metoprolol but dizziness has persisted.   He presents for office visit for dizziness. Dizziness started 8 weeks ago after walking around the house. He reports less dizziness when sitting, but increased dizziness when standing up to walk. He stopped his metoprolol and dizziness improved. He reports being dizzy for most of the day. He stopped metoprolol 4 days ago, was still dizzy for the first 3 days, but has not been dizzy on the fourth day. I do not suspect dizziness is related to the metoprolol.   He is significantly orthostatic - dropped from 140 lying to 108 standing after 3 min. He suspects the metoprolol is causing his dizziness, confirmed with his online research. We had a long discussion regarding his chronic dissection and need for BB therapy and to control his resting blood pressure. He reports diarrhea monthly and admits to not hydrating well. He denies recent illness.    Past Medical History:  Diagnosis Date   Acute thoracic aortic dissection 03/11/2020   Type A   Allergy    Asthma  Cataract    Cataract    Diverticulitis    Dyspnea    GERD (gastroesophageal reflux disease)    Heart murmur 12/2019   Hyperlipidemia    Hypertension    Inguinal hernia bilateral, non-recurrent    S/P aortic dissection repair 03/11/2020   supracoronary straight graft repair with resuspension of native aortic valve and open  distal anastomosis for intraoperative acute type A aortic dissection    S/P mitral valve repair 03/11/2020   Complex valvuloplasty including artificial Gore-tex neochord placement x 4, suture plication of posterior leaflet and posteromedial commissure and 32 mm Sorin Memo 4D ring annuloplasty   Sleep apnea     Past Surgical History:  Procedure Laterality Date   bilateral inguinal hernia     CARDIAC CATHETERIZATION Bilateral 02/21/2020   CHEST TUBE INSERTION N/A 03/22/2020   Procedure: CHEST TUBE INSERTION OF 28 BLAKE DRAIN.;  Surgeon: Grace Isaac, MD;  Location: [place] OR;  Service: Thoracic;  Laterality: N/A;   COLONOSCOPY  06/09/2004   BHA:[id number] colon diverticulosis, otherwise normal colonoscopy   EYE SURGERY  08/2014   HERNIA REPAIR     MITRAL VALVE REPAIR N/A 03/11/2020   Procedure: MITRAL VALVE REPAIR (MVR) USING MEMO 4D SIZE 32 RING;  Surgeon: Rexene Alberts, MD;  Location: [place];  Service: Open Heart Surgery;  Laterality: N/A;   PALATE / UVULA BIOPSY / EXCISION     PERICARDIAL WINDOW N/A 03/22/2020   Procedure: SUBXYPHOID POST-OP PERICARDIAL FLUID DRAINAGE WITH CHEST TUBE INSERTION.;  Surgeon: Grace Isaac, MD;  Location: [place];  Service: Thoracic;  Laterality: N/A;   REPAIR OF ACUTE ASCENDING THORACIC AORTIC DISSECTION N/A 03/11/2020   Procedure: REPAIR OF ACUTE ASCENDING THORACIC AORTIC DISSECTION USING A HEMASHIELD PLATINUM 26MM STRAIGHT GRAFT AND A HEASHIELD PLATINUM 28X10MM SINGLE ARM GRAFT;  Surgeon: Rexene Alberts, MD;  Location: [place];  Service: Open Heart Surgery;  Laterality: N/A;   RIGHT/LEFT HEART CATH AND CORONARY ANGIOGRAPHY N/A 02/21/2020   Procedure: RIGHT/LEFT HEART CATH AND CORONARY ANGIOGRAPHY;  Surgeon: Troy Sine, MD;  Location: [place] [facility];  Service: Cardiovascular;  Laterality: N/A;   TEE WITHOUT CARDIOVERSION N/A 12/19/2019   Procedure: TRANSESOPHAGEAL ECHOCARDIOGRAM (TEE);  Surgeon: Buford Dresser, MD;  Location: [facility];   Service: Cardiovascular;  Laterality: N/A;   TEE WITHOUT CARDIOVERSION N/A 03/11/2020   Procedure: TRANSESOPHAGEAL ECHOCARDIOGRAM (TEE);  Surgeon: Rexene Alberts, MD;  Location: [place];  Service: Open Heart Surgery;  Laterality: N/A;   TEE WITHOUT CARDIOVERSION N/A 03/22/2020   Procedure: TRANSESOPHAGEAL ECHOCARDIOGRAM (TEE);  Surgeon: Grace Isaac, MD;  Location: [facility] OR;  Service: Thoracic;  Laterality: N/A;   TONSILLECTOMY     VASECTOMY      Current Medications: Current Meds  Medication Sig   acetaminophen (TYLENOL) 325 MG tablet Take 1-2 tablets (325-650 mg total) by mouth every 4 (four) hours as needed for mild pain.   albuterol (PROAIR HFA) 108 (90 Base) MCG/ACT inhaler Inhale 2 puffs into the lungs every 6 (six) hours as needed for wheezing.   guaiFENesin (MUCINEX) 600 MG 12 hr tablet Take 600 mg by mouth every 4 (four) hours as needed for cough or to loosen phlegm.   levothyroxine (SYNTHROID) 50 MCG tablet Take 1 tablet (50 mcg total) by mouth daily.   montelukast (SINGULAIR) 10 MG tablet TAKE 1 TABLET DAILY   Olopatadine HCl 0.2 % SOLN Place 1 drop into both eyes daily as needed (allergies).   pantoprazole (PROTONIX) 40 MG tablet  Take 1 tablet (40 mg total) daily by mouth.   simvastatin (ZOCOR) 10 MG tablet TAKE ONE TABLET AT BEDTIME   warfarin (COUMADIN) 5 MG tablet TAKE ONE TABLET ONCE DAILY AT 4PM   [DISCONTINUED] amLODipine (NORVASC) 2.5 MG tablet Take 1 tablet (2.5 mg total) by mouth daily.     Allergies:   Patient has no known allergies.   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  Tobacco Use   Smoking status: Former    Packs/day: 4.00    Years: 25.00    Pack years: 100.00    Types: Cigarettes    Quit date:  06/23/1982    Years since quitting: 38.8   Smokeless tobacco: Never   Tobacco comments:    pt was a truck Land Use: Never used  Substance and Sexual Activity   Alcohol use: Yes    Alcohol/week: 5.0 standard drinks    Types: 5 Shots of liquor per week    Comment: brandy a few times a month, beer once a month   Drug use: No   Sexual activity: Yes    Birth control/protection: Surgical  Other Topics Concern   Not on file  Social History Narrative   Not on file   Social Determinants of Health   Financial Resource Strain: Not on file  Food Insecurity: Not on file  Transportation Needs: Not on file  Physical Activity: Insufficiently Active   Days of Exercise per Week: 4 days   Minutes of Exercise per Session: 30 min  Stress: No Stress Concern Present   Feeling of Stress : Not at all  Social Connections: Not on file     Family History: The patient's family history includes Arthritis in his brother, father, and sister; Dementia (age of onset: 8) in his mother; Heart disease in his father and mother; Hip fracture (age of onset: 11) in his mother; Osteoporosis in his mother. There is no history of Colon cancer.  ROS:   Please see the history of present illness.     All other systems reviewed and are negative.  EKGs/Labs/Other Studies Reviewed:    The following studies were reviewed today:  Echo 04/15/21:  1. Left ventricular ejection fraction, by estimation, is 60 to 65%. Left  ventricular ejection fraction by 3D volume is 63 %. The left ventricle has  normal function. The left ventricle has no regional wall motion  abnormalities. There is mild asymmetric  left ventricular hypertrophy. Left ventricular diastolic parameters are  consistent with Grade I diastolic dysfunction (impaired relaxation).   2. Right ventricular systolic function is moderately reduced. The right  ventricular size is normal. There is normal pulmonary artery systolic  pressure.    3. Left atrial size was moderately dilated.   4. Right atrial size was mildly dilated.   5. The mitral valve has been repaired/replaced. Mild mitral valve  regurgitation.   6. The aortic valve is grossly normal. Aortic valve regurgitation is  mild.   EKG:  EKG is  ordered today.  The ekg ordered today demonstrates sinus rhythm with HR 86, first degree heart block  Recent Labs: 09/10/2020: B Natriuretic Peptide 112.7 01/07/2021: ALT 7; BUN 12;  Creatinine, Ser 1.05; Hemoglobin 14.1; Platelets 225; Potassium 4.3; Sodium 142; TSH 0.824  Recent Lipid Panel    Component Value Date/Time   CHOL 193 01/07/2021 1023   CHOL 168 06/18/2012 1031   TRIG 63 01/07/2021 1023   TRIG 101 01/14/2013 1218   TRIG 102 06/18/2012 1031   HDL 74 01/07/2021 1023   HDL 65 01/14/2013 1218   HDL 62 06/18/2012 1031   CHOLHDL 2.6 01/07/2021 1023   LDLCALC 107 (H) 01/07/2021 1023   LDLCALC 99 01/14/2013 1218   LDLCALC 86 06/18/2012 1031     Risk Assessment/Calculations:    CHA2DS2-VASc Score = 3   This indicates a 3.2% annual risk of stroke. The patient's score is based upon: CHF History: 0 HTN History: 1 Diabetes History: 0 Stroke History: 0 Vascular Disease History: 0 Age Score: 2 Gender Score: 0       Physical Exam:    VS:  Ht 5\' 9"  (1.753 m)    Wt 182 lb 6.4 oz (82.7 kg)    SpO2 96%    BMI 26.94 kg/m     Wt Readings from Last 3 Encounters:  05/05/21 182 lb 6.4 oz (82.7 kg)  03/23/21 180 lb (81.6 kg)  01/26/21 180 lb (81.6 kg)     GEN:  Well nourished, well developed in no acute distress HEENT: Normal NECK: No JVD; No carotid bruits LYMPHATICS: No lymphadenopathy CARDIAC: RRR, no murmurs, rubs, gallops RESPIRATORY:  Clear to auscultation without rales, wheezing or rhonchi  ABDOMEN: Soft, non-tender, non-distended MUSCULOSKELETAL:  No edema; No deformity  SKIN: Warm and dry NEUROLOGIC:  Alert and oriented x 3 PSYCHIATRIC:  Normal affect   ASSESSMENT:    1. Dizziness   2. Essential  hypertension   3. Orthostatic hypotension   4. Chronic thoracic and descending  aortic dissection ([place])   5. S/P aortic dissection repair   6. S/P mitral valve repair   7. PAF (paroxysmal atrial fibrillation) ([place])   8. Hyperlipidemia LDL goal <100   9. Anticoagulated on Coumadin   10. Obstructive sleep apnea (adult) (pediatric)    PLAN:    In order of problems listed above:  Dizziness - etiology somewhat unclear - differential may include dehydration, orthostatic hypotension, PAF, arrhythmia, bradycardia/pauses, changes in thyroid hormone, inner ear problems - he was significantly orthostatic in the office and I suspect this is driving his dizziness - difficult situation - need to control BB for chronic dissection, but need to control his orthostatic hypotension - he is only on 2.5 mg amlodipine and no BB - SBP was 140 - I will stop amlodipine and restart 50 mg toprol - I will collect TSH/fee T4, CBC, CMP, A1c, and lipid profile today - if labs unrevealing, will consider 14 day zio patch - I will also send an ambulatory referral to ENT - I have also recommended an abdominal binder and thigh sleeves - strongly encouraged to start using his CPAP   Hypertension Orthostatic hypotension - SBP 140 on 2.5 mg amlodipine and no BB - will D/C amlodipine and restart 50 mg toprol - he will continue to monitor symptoms - encouraged CPAP   Chronic thoracic and descending aortic dissection Type A aortic dissection s/p graft repair - strongly encouraged to resume BB - will stop amlodipine   MVP s/p mitral valve repair - stable on last echo   OSA  not on CPAP - encouraged him to restart CPAP - this may help his resting hypertension - info given from [place]  W.   PAF Chronic anticoagulation He does not think that he is having PAF. No palpitations. If no improvement in symptoms with medication changes and labs unrevealing, will place heart monitor.    Follow up with Dr. Claiborne Billings.          Medication Adjustments/Labs and Tests Ordered: Current medicines are reviewed at length with the patient today.  Concerns regarding medicines are outlined above.  Orders Placed This Encounter  Procedures   TSH + free T4   CBC   Comprehensive metabolic panel   Hemoglobin A1c   Lipid panel   Ambulatory referral to ENT   EKG 12-Lead   Meds ordered this encounter  Medications   metoprolol succinate (TOPROL-XL) 50 MG 24 hr tablet    Sig: Take 1 tablet by mouth at night    Dispense:  90 tablet    Refill:  3    Patient Instructions  Medication Instructions:  Your physician recommends that you continue on your current medications as directed. Please refer to the Current Medication list given to you today.  *If you need a refill on your cardiac medications before your next appointment, please call your pharmacy*  Lab Work: Your physician recommends that you return for lab work AT [place]:  A1c CBC CMET TSH+Free T4 Fasting Lipid Panel-DO NOT EAT OR DRINK PAST MIDNIGHT. OKAY TO HAVE WATER.  If you have labs (blood work) drawn today and your tests are completely normal, you will receive your results only by: [place] (if you have MyChart) OR A paper copy in the mail If you have any lab test that is abnormal or we need to change your treatment, we will call you to review the results.  Testing/Procedures: You have been referred to ENT   Follow-Up: At [facility], you and your health needs are our priority.  As part of our continuing mission to provide you with exceptional heart care, we have created designated Provider Care Teams.  These Care Teams include your primary Cardiologist (physician) and Advanced Practice Providers (APPs -  Physician Assistants and Nurse Practitioners) who all work together to provide you with the care you need, when you need it.   Your next appointment:   1st available    The format for your next appointment:   In  Person  Provider:   Shelva Majestic, MD    Other Instructions Fabian Sharp, PA-C has recommended that you purchase an abdominal binder.     Signed, Ledora Bottcher, PA  05/05/2021 11:30 AM    [facility]

## 2021-05-03 NOTE — Telephone Encounter (Signed)
Patient called stating he is dizzy most of the time. He would like to discuss his medications with Dr. Claiborne Billings.

## 2021-05-03 NOTE — Telephone Encounter (Signed)
Returned call to pt he states that he has been dizzy for at least the last 2 weeks and would like Dr Claiborne Billings to call him back. Informed pt that this would need to be done at an appointment. He states that he has stopped his metoprolol. He states that he just stopped and his dizziness continues. He states that he will "only take if BP>150" tried to convince pt to continue taking the metoprolol, he will not. He is convinced this is what is causing. He states that he is still taking his amlodipine. Found appt 05-05-20 to discuss. He states that he will arrive early to discuss

## 2021-05-05 ENCOUNTER — Encounter: Payer: Self-pay | Admitting: Physician Assistant

## 2021-05-05 ENCOUNTER — Other Ambulatory Visit: Payer: Self-pay

## 2021-05-05 ENCOUNTER — Ambulatory Visit: Payer: Medicare Other | Admitting: Physician Assistant

## 2021-05-05 VITALS — Ht 69.0 in | Wt 182.4 lb

## 2021-05-05 DIAGNOSIS — Z9889 Other specified postprocedural states: Secondary | ICD-10-CM

## 2021-05-05 DIAGNOSIS — I951 Orthostatic hypotension: Secondary | ICD-10-CM | POA: Diagnosis not present

## 2021-05-05 DIAGNOSIS — I480 Paroxysmal atrial fibrillation: Secondary | ICD-10-CM | POA: Diagnosis not present

## 2021-05-05 DIAGNOSIS — I10 Essential (primary) hypertension: Secondary | ICD-10-CM | POA: Diagnosis not present

## 2021-05-05 DIAGNOSIS — I71019 Dissection of thoracic aorta, unspecified: Secondary | ICD-10-CM | POA: Diagnosis not present

## 2021-05-05 DIAGNOSIS — G4733 Obstructive sleep apnea (adult) (pediatric): Secondary | ICD-10-CM | POA: Diagnosis not present

## 2021-05-05 DIAGNOSIS — E785 Hyperlipidemia, unspecified: Secondary | ICD-10-CM | POA: Diagnosis not present

## 2021-05-05 DIAGNOSIS — Z7901 Long term (current) use of anticoagulants: Secondary | ICD-10-CM

## 2021-05-05 DIAGNOSIS — R42 Dizziness and giddiness: Secondary | ICD-10-CM

## 2021-05-05 MED ORDER — METOPROLOL SUCCINATE ER 50 MG PO TB24: ORAL_TABLET | ORAL | 3 refills | Status: DC

## 2021-05-05 NOTE — Patient Instructions (Addendum)
Medication Instructions:  STOP Amlodipine   *If you need a refill on your cardiac medications before your next appointment, please call your pharmacy*  Lab Work: Your physician recommends that you return for lab work AT [place]:  A1c CBC CMET TSH+Free T4 Fasting Lipid Panel-DO NOT EAT OR DRINK PAST MIDNIGHT. OKAY TO HAVE WATER.  If you have labs (blood work) drawn today and your tests are completely normal, you will receive your results only by: [place] (if you have MyChart) OR A paper copy in the mail If you have any lab test that is abnormal or we need to change your treatment, we will call you to review the results.  Testing/Procedures: You have been referred to ENT   Follow-Up: At [facility], you and your health needs are our priority.  As part of our continuing mission to provide you with exceptional heart care, we have created designated Provider Care Teams.  These Care Teams include your primary Cardiologist (physician) and Advanced Practice Providers (APPs -  Physician Assistants and Nurse Practitioners) who all work together to provide you with the care you need, when you need it.   Your next appointment:   1st available    The format for your next appointment:   In Person  Provider:   Shelva Majestic, MD    Other Instructions Fabian Sharp, PA-C has recommended that you purchase an abdominal binder.

## 2021-05-06 ENCOUNTER — Other Ambulatory Visit: Payer: Medicare Other

## 2021-05-06 ENCOUNTER — Other Ambulatory Visit: Payer: Self-pay

## 2021-05-06 DIAGNOSIS — I480 Paroxysmal atrial fibrillation: Secondary | ICD-10-CM | POA: Diagnosis not present

## 2021-05-06 DIAGNOSIS — E785 Hyperlipidemia, unspecified: Secondary | ICD-10-CM | POA: Diagnosis not present

## 2021-05-06 DIAGNOSIS — Z9889 Other specified postprocedural states: Secondary | ICD-10-CM | POA: Diagnosis not present

## 2021-05-06 DIAGNOSIS — I951 Orthostatic hypotension: Secondary | ICD-10-CM | POA: Diagnosis not present

## 2021-05-06 DIAGNOSIS — R42 Dizziness and giddiness: Secondary | ICD-10-CM | POA: Diagnosis not present

## 2021-05-06 DIAGNOSIS — I71019 Dissection of thoracic aorta, unspecified: Secondary | ICD-10-CM | POA: Diagnosis not present

## 2021-05-06 LAB — LIPID PANEL

## 2021-05-07 LAB — COMPREHENSIVE METABOLIC PANEL
ALT: 7 IU/L (ref 0–44)
AST: 17 IU/L (ref 0–40)
Albumin/Globulin Ratio: 2.2 (ref 1.2–2.2)
Albumin: 4.2 g/dL (ref 3.6–4.6)
Alkaline Phosphatase: 69 IU/L (ref 44–121)
BUN/Creatinine Ratio: 13 (ref 10–24)
BUN: 14 mg/dL (ref 8–27)
Bilirubin Total: 0.6 mg/dL (ref 0.0–1.2)
CO2: 23 mmol/L (ref 20–29)
Calcium: 9.2 mg/dL (ref 8.6–10.2)
Chloride: 105 mmol/L (ref 96–106)
Creatinine, Ser: 1.12 mg/dL (ref 0.76–1.27)
Globulin, Total: 1.9 g/dL (ref 1.5–4.5)
Glucose: 87 mg/dL (ref 70–99)
Potassium: 4.2 mmol/L (ref 3.5–5.2)
Sodium: 141 mmol/L (ref 134–144)
Total Protein: 6.1 g/dL (ref 6.0–8.5)
eGFR: 66 mL/min/{1.73_m2} (ref >59)

## 2021-05-07 LAB — CBC
Hematocrit: 45.3 % (ref 37.5–51.0)
Hemoglobin: 14.6 g/dL (ref 13.0–17.7)
MCH: 26.9 pg (ref 26.6–33.0)
MCHC: 32.2 g/dL (ref 31.5–35.7)
MCV: 84 fL (ref 79–97)
Platelets: 212 10*3/uL (ref 150–450)
RBC: 5.42 x10E6/uL (ref 4.14–5.80)
RDW: 14.3 % (ref 11.6–15.4)
WBC: 4.7 10*3/uL (ref 3.4–10.8)

## 2021-05-07 LAB — LIPID PANEL
Chol/HDL Ratio: 2.4 ratio (ref 0.0–5.0)
Cholesterol, Total: 190 mg/dL (ref 100–199)
HDL: 78 mg/dL (ref >39)
LDL Chol Calc (NIH): 102 mg/dL — ABNORMAL HIGH (ref 0–99)
Triglycerides: 53 mg/dL (ref 0–149)
VLDL Cholesterol Cal: 10 mg/dL (ref 5–40)

## 2021-05-07 LAB — HEMOGLOBIN A1C
Est. average glucose Bld gHb Est-mCnc: 120 mg/dL
Hgb A1c MFr Bld: 5.8 % — ABNORMAL HIGH (ref 4.8–5.6)

## 2021-05-07 LAB — TSH+FREE T4
Free T4: 1.54 ng/dL (ref 0.82–1.77)
TSH: 1.44 u[IU]/mL (ref 0.450–4.500)

## 2021-05-11 ENCOUNTER — Telehealth: Payer: Self-pay | Admitting: Physician Assistant

## 2021-05-11 MED ORDER — ABDOMINAL BINDER/ELASTIC LARGE MISC: 1.0000 | Freq: Every day | 0 refills | Status: DC

## 2021-05-11 NOTE — Telephone Encounter (Signed)
Patient states he was not able to respond on mychart to his lab results question. He states the dizziness has lessened by 90%. He also says he has a question about his AVS. He says it mentioned her should get a belt and would like to know if that will be prescribed/ He also says he was supposed to be referred to an ENT doctor and would like to know if that has been done.

## 2021-05-11 NOTE — Telephone Encounter (Signed)
Spoke with pt, aware referral was placed and if he does not hear from the ENT office to let us know. Order for abdominal binder mailed to the patient per his request to confirmed home address.

## 2021-05-13 ENCOUNTER — Other Ambulatory Visit: Payer: Self-pay | Admitting: Family Medicine

## 2021-05-14 ENCOUNTER — Other Ambulatory Visit: Payer: Self-pay | Admitting: Family Medicine

## 2021-05-14 DIAGNOSIS — K219 Gastro-esophageal reflux disease without esophagitis: Secondary | ICD-10-CM

## 2021-05-19 ENCOUNTER — Ambulatory Visit: Payer: Medicare Other | Admitting: Surgery

## 2021-05-19 ENCOUNTER — Other Ambulatory Visit: Payer: Self-pay

## 2021-05-19 ENCOUNTER — Encounter: Payer: Self-pay | Admitting: Surgery

## 2021-05-19 ENCOUNTER — Telehealth: Payer: Self-pay | Admitting: Physician Assistant

## 2021-05-19 VITALS — BP 170/86 | HR 72 | Resp 20 | Ht 69.0 in | Wt 182.0 lb

## 2021-05-19 DIAGNOSIS — I7101 Dissection of ascending aorta: Secondary | ICD-10-CM | POA: Diagnosis not present

## 2021-05-19 NOTE — Telephone Encounter (Signed)
LMTCB

## 2021-05-19 NOTE — Telephone Encounter (Signed)
Called patient, he states he seen Dr.Bartle today- note in epic, patients BP was elevated. Dr.Bartle suggested to start back on 2.5 mg amlodipine in addition with the metoprolol 50 mg daily.  He just wanted to make his team here aware. I updated med list, and added the amlodipine 2.5 mg daily back to list.

## 2021-05-19 NOTE — Progress Notes (Signed)
HPI:  The patient is an 81 year old gentleman who underwent minimally invasive mitral valve repair on 03/11/2020 by Dr. Roxy Manns which was complicated by an acute aortic dissection after initiation of cardiopulmonary bypass requiring conversion to sternotomy and replacement of the ascending aorta with resuspension of the native aortic valve followed by mitral valve repair.  He developed a significant pericardial effusion postoperatively and had to undergo pericardial window.  His recovery over the past two years was also slow due to development of [facility] spotted fever last April followed by COVID-19 a few months later.  He has been walking daily but gets some shortness of breath.  He has been followed by Dr. Claiborne Billings and according to the notes has a history of self adjusting and self discontinuing medications.  The patient tells me that he stopped taking his amlodipine 2.5 mg because he was getting dizzy and he thought that that may be causing it.  He has discontinued his beta-blocker in the past but is now taking Toprol-XL 50 mg daily.  His most recent echo on 04/15/2021 showed an ejection fraction of 60 to 65%.  There is mild mitral valve regurgitation.  There is mild aortic valve insufficiency.  Both of these look trivial to me.  Current Outpatient Medications  Medication Sig Dispense Refill   acetaminophen (TYLENOL) 325 MG tablet Take 1-2 tablets (325-650 mg total) by mouth every 4 (four) hours as needed for mild pain.     albuterol (PROAIR HFA) 108 (90 Base) MCG/ACT inhaler Inhale 2 puffs into the lungs every 6 (six) hours as needed for wheezing. 8.5 g 2   Elastic Bandages & Supports (ABDOMINAL BINDER/ELASTIC LARGE) MISC 1 each by Does not apply route daily. 1 each 0   guaiFENesin (MUCINEX) 600 MG 12 hr tablet Take 600 mg by mouth every 4 (four) hours as needed for cough or to loosen phlegm.     levothyroxine (SYNTHROID) 50 MCG tablet TAKE 1 TABLET DAILY 90 tablet 0   metoprolol succinate  (TOPROL-XL) 50 MG 24 hr tablet Take 1 tablet by mouth at night 90 tablet 3   montelukast (SINGULAIR) 10 MG tablet TAKE 1 TABLET DAILY 90 tablet 3   Olopatadine HCl 0.2 % SOLN Place 1 drop into both eyes daily as needed (allergies).     pantoprazole (PROTONIX) 40 MG tablet TAKE 1 TABLET DAILY 90 tablet 0   simvastatin (ZOCOR) 10 MG tablet TAKE ONE TABLET AT BEDTIME 90 tablet 3   warfarin (COUMADIN) 5 MG tablet TAKE ONE TABLET ONCE DAILY AT 4PM 90 tablet 0   No current facility-administered medications for this visit.     Physical Exam: BP (!) 170/86    Pulse 72    Resp 20    Ht 5\' 9"  (1.753 m)    Wt 182 lb (82.6 kg)    SpO2 97%    BMI 26.88 kg/m  He looks well. Cardiac exam shows regular rate and rhythm with a 2/6 systolic murmur along the right sternal border.  There is no diastolic murmur. Lungs are clear. There is no peripheral edema.  Diagnostic Tests:  Narrative & Impression  CLINICAL DATA:  Follow up thoracic aortic dissection. Surgical repair of the ascending aorta 03/11/2020.   EXAM: CT ANGIOGRAPHY CHEST, ABDOMEN AND PELVIS   TECHNIQUE: Non-contrast CT of the chest was initially obtained.   Multidetector CT imaging through the chest, abdomen and pelvis was performed using the standard protocol during bolus administration of intravenous contrast. Multiplanar reconstructed images and  MIPs were obtained and reviewed to evaluate the vascular anatomy.   CONTRAST:  39mL ISOVUE-370 IOPAMIDOL (ISOVUE-370) INJECTION 76%   COMPARISON:  Prior CT 09/10/2020   FINDINGS: CTA CHEST FINDINGS   Cardiovascular: Stable postsurgical changes related to repair of the ascending thoracic aorta and mitral valve replacement. The underlying aortic dissection appears unchanged, extending from just proximal to the innominate artery origin into the abdominal aorta. Dissection flap extends into the left subclavian artery as before. The left vertebral artery and additional great vessels are  patent. The true lumen within the aortic arch and descending aorta demonstrates preferential enhancement and is unchanged in size. There is no progressive aortic dilatation or evidence of mediastinal hematoma. Underlying mild coronary artery atherosclerosis noted.   Mediastinum/Nodes: There are no enlarged mediastinal, hilar or axillary lymph nodes. Small mediastinal lymph nodes are unchanged. There is no mediastinal hematoma. The thyroid gland, trachea and esophagus demonstrate no significant findings.   Lungs/Pleura: No pleural effusion or pneumothorax. Underlying mild centrilobular emphysema with scattered subpleural reticulation and scarring. Stable 7 mm right middle lobe nodule on image 124/7. No new or enlarging nodules are identified.   Musculoskeletal: No acute or suspicious osseous findings. Stable appearance of the median sternotomy. Mild thoracic spondylosis.   Review of the MIP images confirms the above findings.   CTA ABDOMEN AND PELVIS FINDINGS   VASCULAR   Aorta: Stable inferior extension of the aortic dissection flap into abdominal aorta, and both common and external iliac arteries. Stable size of the true lumen which remains mildly compressed and located on the left within the upper abdomen and posteriorly below the renal arteries. There is persistent opacification of both lumens. No evidence of abdominal aortic aneurysm.   Celiac: Patent without evidence of aneurysm, dissection, vasculitis or significant stenosis.   SMA: Extension of the dissection flap into the superior mesenteric artery is no longer visualized. There is mild luminal narrowing of the proximal SMA without significant stenosis.   Renals: Improvement in the previously demonstrated extension of the dissection into both renal arteries. No significant vascular stenosis.   IMA: Remains patent, arising from the false lumen.   Inflow: Stable extension of the dissection flap into both common  and external iliac arteries. The flap extends into the left common femoral artery. No evidence of large vessel occlusion or retroperitoneal hemorrhage.   Veins: Limited venous opacification without acute abnormality.   Review of the MIP images confirms the above findings.   NON-VASCULAR   Hepatobiliary: No focal liver abnormality is seen. No gallstones, gallbladder wall thickening, or biliary dilatation.   Pancreas: Unremarkable. No pancreatic ductal dilatation or surrounding inflammatory changes.   Spleen: Normal in size without focal abnormality.   Adrenals/Urinary Tract: Both adrenal glands appear normal. No evidence of renal mass, urinary tract calculus or hydronephrosis. The bladder appears unremarkable.   Stomach/Bowel: No enteric contrast administered. The stomach appears normal for its degree of distention. No evidence of bowel wall thickening, distention or surrounding inflammation. Diffuse diverticular changes are again noted throughout the descending and sigmoid colon. The appendix appears normal.   Lymphatic: No enlarged abdominopelvic lymph nodes.   Reproductive: Stable mild enlargement of the prostate gland. The seminal vesicles appear unremarkable.   Other: Intact abdominal wall. No ascites, free air or focal extraluminal fluid collection.   Musculoskeletal: No acute osseous findings. Stable degenerative changes in the spine associated with a convex left lumbar scoliosis.   Review of the MIP images confirms the above findings.   IMPRESSION: 1. Stable  extent of the residual thoracic aortic dissection status post tube graft repair of the ascending aorta. No progressive aortic dilatation or evidence of acute hemorrhage. 2. Extension of the dissection flap into the superior mesenteric and bilateral renal arteries has improved compared with the prior study. The distal extension into the bilateral iliac and left common femoral arteries is unchanged. No  evidence of large vessel occlusion. 3. Stable heart size without pericardial effusion. 4. Diffuse distal colonic diverticulosis, as before.     Electronically Signed   By: Richardean Sale M.D.   On: 03/23/2021 15:08     ECHOCARDIOGRAM REPORT         Patient Name:   Patrick Bell Date of Exam: 04/15/2021  Medical Rec #:  [id number]       Height:       70.0 in  Accession #:    [id number]      Weight:       180.0 lb  Date of Birth:  Dec 15, 1940       BSA:          1.996 m  Patient Age:    73 years        BP:           154/83 mmHg  Patient Gender: M               HR:           97 bpm.  Exam Location:  [facility]   Procedure: 2D Echo, 3D Echo, Cardiac Doppler and Color Doppler   Indications:    I05.9 Mitral Valve Disease                  Z98.890 Status Post Aortic Dissection Repair     History:        Patient has prior history of Echocardiogram examinations,  most                  recent 05/07/2020. Signs/Symptoms:Murmur,                  Dizziness/Lightheadedness, Fatigue and Shortness of  Breath; Risk                  Factors:Sleep Apnea, Hypertension, Dyslipidemia, Family  History                  of Coronary Artery Disease and Former Smoker. Status Post  Mitral                  Valve Repair (03-11-20, 70mm Sorin Memo 4D Ring) with Acute                  Aortic Dissection (Type A) after bypass initiation. Repair  with                  a Supracoronary Straight Graft and resuspension of native                  Coronary Arteries.     Sonographer:    Deliah Boston RDCS  Referring Phys: Gaye Pollack   IMPRESSIONS     1. Left ventricular ejection fraction, by estimation, is 60 to 65%. Left  ventricular ejection fraction by 3D volume is 63 %. The left ventricle has  normal function. The left ventricle has no regional wall motion  abnormalities. There is mild asymmetric  left ventricular hypertrophy. Left ventricular diastolic parameters are  consistent with Grade I  diastolic dysfunction (  impaired relaxation).   2. Right ventricular systolic function is moderately reduced. The right  ventricular size is normal. There is normal pulmonary artery systolic  pressure.   3. Left atrial size was moderately dilated.   4. Right atrial size was mildly dilated.   5. The mitral valve has been repaired/replaced. Mild mitral valve  regurgitation.   6. The aortic valve is grossly normal. Aortic valve regurgitation is  mild.   FINDINGS   Left Ventricle: Left ventricular ejection fraction, by estimation, is 60  to 65%. Left ventricular ejection fraction by 3D volume is 63 %. The left  ventricle has normal function. The left ventricle has no regional wall  motion abnormalities. The left  ventricular internal cavity size was normal in size. There is mild  asymmetric left ventricular hypertrophy. Left ventricular diastolic  parameters are consistent with Grade I diastolic dysfunction (impaired  relaxation).   Right Ventricle: The right ventricular size is normal. Right vetricular  wall thickness was not well visualized. Right ventricular systolic  function is moderately reduced. There is normal pulmonary artery systolic  pressure. The tricuspid regurgitant  velocity is 2.23 m/s, and with an assumed right atrial pressure of 3 mmHg,  the estimated right ventricular systolic pressure is 56.9 mmHg.   Left Atrium: Left atrial size was moderately dilated.   Right Atrium: Right atrial size was mildly dilated.   Pericardium: There is no evidence of pericardial effusion.   Mitral Valve: The mitral valve has been repaired/replaced. Mild mitral  valve regurgitation.   Tricuspid Valve: The tricuspid valve is normal in structure. Tricuspid  valve regurgitation is trivial.   Aortic Valve: The aortic valve is grossly normal. Aortic valve  regurgitation is mild. Aortic regurgitation PHT measures 595 msec.   Pulmonic Valve: The pulmonic valve was normal in structure.  Pulmonic valve  regurgitation is mild.   Aorta: The aortic root and ascending aorta are structurally normal, with  no evidence of dilitation.   IAS/Shunts: The atrial septum is grossly normal.      LEFT VENTRICLE  PLAX 2D  LVIDd:         5.20 cm         Diastology  LVIDs:         3.10 cm         LV e' medial:    7.51 cm/s  LV PW:         1.50 cm         LV E/e' medial:  15.4  LV IVS:        0.90 cm         LV e' lateral:   15.20 cm/s  LVOT diam:     2.20 cm         LV E/e' lateral: 7.6  LV SV:         63  LV SV Index:   32  LVOT Area:     3.80 cm        3D Volume EF                                 LV 3D EF:    Left  ventricul                                              ar                                              ejection                                              fraction                                              by 3D                                              volume is                                              63 %.                                    3D Volume EF:                                 3D EF:        63 %                                 LV EDV:       132 ml                                 LV ESV:       49 ml                                 LV SV:        82 ml   RIGHT VENTRICLE  RV S prime:     7.72 cm/s  TAPSE (M-mode): 1.2 cm   LEFT ATRIUM             Index        RIGHT ATRIUM           Index  LA diam:        4.70 cm 2.35 cm/m   RA Area:     18.90 cm  LA Vol (A2C):   72.8 ml 36.47 ml/m  RA Volume:   52.40 ml  26.25 ml/m  LA Vol (A4C):   88.0 ml 44.09 ml/m  LA  Biplane Vol: 84.8 ml 42.49 ml/m   AORTIC VALVE  LVOT Vmax:   78.30 cm/s  LVOT Vmean:  54.350 cm/s  LVOT VTI:    0.166 m  AI PHT:      595 msec     AORTA  Ao Root diam: 3.80 cm  Ao Asc diam:  3.20 cm   MITRAL VALVE                TRICUSPID VALVE  MV Area (PHT): cm          TR Peak grad:   19.9 mmHg  MV Decel Time: 218 msec     TR  Vmax:        223.00 cm/s  MV E velocity: 116.00 cm/s  MV A velocity: 144.50 cm/s  SHUNTS  MV E/A ratio:  0.80         Systemic VTI:  0.17 m                              Systemic Diam: 2.20 cm   Mertie Moores MD  Electronically signed by Mertie Moores MD  Signature Date/Time: 04/15/2021/3:20:46 PM         Final     Impression:  He is a little over 2 years out from his surgery.  Recent echocardiogram shows stable aortic and mitral valve repairs with trivial residual aortic insufficiency and mitral regurgitation.  Left ventricular function is normal.  CTA of the chest/abdomen/pelvis in December 2022 showed stable ascending aortic repair.  The residual dissection flap is visible just beyond the repair extending through the aortic arch and down into the abdominal aorta into bilateral iliac arteries and left common femoral artery.  The dissection flap extends into the SMA and bilateral renal arteries.  The maximum diameter of the aorta in the aortic arch is 4.5 cm.  The residual dissection overall looks stable.  I reviewed the CTA images and echo images with the patient and his wife and answered all their questions.  I told him he could return to normal activity as long as he was not doing very heavy lifting that would require a Valsalva maneuver.  I stressed the importance of good blood pressure control in preventing further enlargement of his aorta and acute aortic dissection.  His blood pressure is elevated today but he said that he does check it at home and is usually in the 140s maximum.  I advised him to continue checking his blood pressure periodically.  I also told him to resume taking the Norvasc 2.5 mg daily in addition to his Toprol-XL 50 mg daily.  I strongly advised him against discontinuing or changing doses of medications without the input of his physicians.  Plan:  I will plan to see him back in 1 year with a CTA of the chest, abdomen, and pelvis to follow-up on his chronic residual  aortic dissection.  I spent 20 minutes performing this established patient evaluation and > 50% of this time was spent face to face counseling and coordinating the care of this patient's aortic dissection.   Gaye Pollack, MD [facility] [phone]

## 2021-05-19 NOTE — Telephone Encounter (Signed)
See other note- updated med list.   Sent to MD/PA.

## 2021-05-19 NOTE — Telephone Encounter (Signed)
Patient called stating he is going back on amlodipine 2.5mg , because his BP was over 130's.  His thoracic surgeon wants him to go back on it because he was concerned about his BP.  He wanted to make [place] aware he was back on amlodipine.

## 2021-05-19 NOTE — Telephone Encounter (Signed)
Patient returning call.

## 2021-05-21 NOTE — Telephone Encounter (Signed)
ok 

## 2021-06-10 ENCOUNTER — Encounter: Payer: Self-pay | Admitting: Urology

## 2021-06-10 ENCOUNTER — Other Ambulatory Visit: Payer: Self-pay

## 2021-06-10 ENCOUNTER — Ambulatory Visit: Payer: Medicare Other

## 2021-06-10 ENCOUNTER — Ambulatory Visit: Payer: Medicare Other | Admitting: Urology

## 2021-06-10 VITALS — BP 117/66 | HR 78

## 2021-06-10 DIAGNOSIS — Z5181 Encounter for therapeutic drug level monitoring: Secondary | ICD-10-CM

## 2021-06-10 DIAGNOSIS — R350 Frequency of micturition: Secondary | ICD-10-CM | POA: Diagnosis not present

## 2021-06-10 DIAGNOSIS — N401 Enlarged prostate with lower urinary tract symptoms: Secondary | ICD-10-CM | POA: Diagnosis not present

## 2021-06-10 DIAGNOSIS — R972 Elevated prostate specific antigen [PSA]: Secondary | ICD-10-CM | POA: Diagnosis not present

## 2021-06-10 DIAGNOSIS — N138 Other obstructive and reflux uropathy: Secondary | ICD-10-CM | POA: Diagnosis not present

## 2021-06-10 DIAGNOSIS — R3912 Poor urinary stream: Secondary | ICD-10-CM

## 2021-06-10 DIAGNOSIS — I71019 Dissection of thoracic aorta, unspecified: Secondary | ICD-10-CM

## 2021-06-10 DIAGNOSIS — R3915 Urgency of urination: Secondary | ICD-10-CM

## 2021-06-10 DIAGNOSIS — Z9889 Other specified postprocedural states: Secondary | ICD-10-CM

## 2021-06-10 LAB — POCT INR: INR: 1.8 — AB (ref 2.0–3.0)

## 2021-06-10 LAB — BLADDER SCAN AMB NON-IMAGING: Scan Result: 0

## 2021-06-10 MED ORDER — GEMTESA 75 MG PO TABS: 75.0000 mg | ORAL_TABLET | Freq: Every day | ORAL | 0 refills | Status: DC

## 2021-06-10 NOTE — Patient Instructions (Signed)
-  TAKE 2 TABLETS TONIGHT ONLY and then ?- resume taking 1 tablet daily except 1/2 tablet each Sunday.   ?- Repeat INR in 2 weeks ? ?

## 2021-06-10 NOTE — Progress Notes (Signed)
Subjective: 1. Elevated PSA   2. BPH with urinary obstruction   3. Urgency of urination   4. Urinary frequency   5. Weak urinary stream       [name] is an 81 yo male who I saw in [place] on 02/21/21 with a PSA of 7.1 following a bout of COVID.  A repeat level on 04/28/21 was down to 2.91 which is around his prior baseline level.  He has BPH with BOO and was given tamsulosin to try but he doesn't think he took it.  He had frequency, urgency, nocturia and a sensation of incomplete emptying with an IPSS of 20 at his last visit.  He had some bacteriuria but a negative culture at his visit on 02/21/21.  A PVR today is minimal and his PF is 3m/sec with only 559mvoided.  He has some issues with urgency. ROS:  Review of Systems  Constitutional:  Positive for malaise/fatigue.  HENT:  Positive for sinus pain.   Respiratory:  Positive for shortness of breath.   Gastrointestinal:  Positive for diarrhea.  Musculoskeletal:  Positive for back pain.  Neurological:  Positive for dizziness.   No Known Allergies  Past Medical History:  Diagnosis Date   Acute thoracic aortic dissection 03/11/2020   Type A   Allergy    Asthma    Cataract    Cataract    Diverticulitis    Dyspnea    GERD (gastroesophageal reflux disease)    Heart murmur 12/2019   Hyperlipidemia    Hypertension    Inguinal hernia bilateral, non-recurrent    S/P aortic dissection repair 03/11/2020   supracoronary straight graft repair with resuspension of native aortic valve and open distal anastomosis for intraoperative acute type A aortic dissection    S/P mitral valve repair 03/11/2020   Complex valvuloplasty including artificial Gore-tex neochord placement x 4, suture plication of posterior leaflet and posteromedial commissure and 32 mm Sorin Memo 4D ring annuloplasty   Sleep apnea     Past Surgical History:  Procedure Laterality Date   bilateral inguinal hernia     CARDIAC CATHETERIZATION Bilateral 02/21/2020    CHEST TUBE INSERTION N/A 03/22/2020   Procedure: CHEST TUBE INSERTION OF 28 BLAKE DRAIN.;  Surgeon: GeGrace IsaacMD;  [facility];  Service: Thoracic;  Laterality: N/A;   COLONOSCOPY  06/09/2004   NUTMH:[id number] diverticulosis, otherwise normal colonoscopy   EYE SURGERY  08/2014   HERNIA REPAIR     MITRAL VALVE REPAIR N/A 03/11/2020   Procedure: MITRAL VALVE REPAIR (MVR) USING MEMO 4D SIZE 32 RING;  Surgeon: OwRexene AlbertsMD;  Location: [facility];  Service: Open Heart Surgery;  Laterality: N/A;   PALATE / UVULA BIOPSY / EXCISION     PERICARDIAL WINDOW N/A 03/22/2020   Procedure: SUBXYPHOID POST-OP PERICARDIAL FLUID DRAINAGE WITH CHEST TUBE INSERTION.;  Surgeon: GeGrace IsaacMD;  [facility] Service: Thoracic;  Laterality: N/A;   REPAIR OF ACUTE ASCENDING THORACIC AORTIC DISSECTION N/A 03/11/2020   Procedure: REPAIR OF ACUTE ASCENDING THORACIC AORTIC DISSECTION USING A HEMASHIELD PLATINUM 26MM STRAIGHT GRAFT AND A HEASHIELD PLATINUM 28X10MM SINGLE ARM GRAFT;  Surgeon: OwRexene AlbertsMD;  [facility] Service: Open Heart Surgery;  Laterality: N/A;   RIGHT/LEFT HEART CATH AND CORONARY ANGIOGRAPHY N/A 02/21/2020   Procedure: RIGHT/LEFT HEART CATH AND CORONARY ANGIOGRAPHY;  Surgeon: KeTroy SineMD;  [facility];  Service: Cardiovascular;  Laterality: N/A;   TEE  WITHOUT CARDIOVERSION N/A 12/19/2019   Procedure: TRANSESOPHAGEAL ECHOCARDIOGRAM (TEE);  Surgeon: Buford Dresser, MD;  [facility] ENDOSCOPY;  Service: Cardiovascular;  Laterality: N/A;   TEE WITHOUT CARDIOVERSION N/A 03/11/2020   Procedure: TRANSESOPHAGEAL ECHOCARDIOGRAM (TEE);  Surgeon: Rexene Alberts, MD;  Location: [place];  Service: Open Heart Surgery;  Laterality: N/A;   TEE WITHOUT CARDIOVERSION N/A 03/22/2020   Procedure: TRANSESOPHAGEAL ECHOCARDIOGRAM (TEE);  Surgeon: Grace Isaac, MD;  Location: [facility] OR;  Service: Thoracic;  Laterality: N/A;   TONSILLECTOMY      VASECTOMY      Social History   Socioeconomic History   Marital status: Married    Spouse name: Patrick Bell   Number of children: 2   Years of education: 14   Highest education level: Some college, no degree  Occupational History   Occupation: retired    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    Comment: truck driving  Tobacco Use   Smoking status: Former    Packs/day: 4.00    Years: 25.00    Pack years: 100.00    Types: Cigarettes    Quit date: 06/23/1982    Years since quitting: 38.9   Smokeless tobacco: Never   Tobacco comments:    pt was a truck Land Use: Never used  Substance and Sexual Activity   Alcohol use: Yes    Alcohol/week: 5.0 standard drinks    Types: 5 Shots of liquor per week    Comment: brandy a few times a month, beer once a month   Drug use: No   Sexual activity: Yes    Birth control/protection: Surgical  Other Topics Concern   Not on file  Social History Narrative   Not on file   Social Determinants of Health   Financial Resource Strain: Not on file  Food Insecurity: Not on file  Transportation Needs: Not on file  Physical Activity: Insufficiently Active   Days of Exercise per Week: 4 days   Minutes of Exercise per Session: 30 min  Stress: No Stress Concern Present   Feeling of Stress : Not at all  Social Connections: Not on file  Intimate Partner Violence: Not on file    Family History  Problem Relation Age of Onset   Heart disease Mother    Hip fracture Mother 23   Osteoporosis Mother    Dementia Mother 18   Arthritis Father    Heart disease Father    Arthritis Sister    Arthritis Brother    Colon cancer Neg Hx     Anti-infectives: Anti-infectives (From admission, onward)    None       Current Outpatient Medications  Medication Sig Dispense Refill   Vibegron (GEMTESA) 75 MG TABS Take 75 mg by mouth daily. 42 tablet 0   acetaminophen (TYLENOL) 325 MG tablet Take 1-2 tablets (325-650 mg total) by mouth every 4  (four) hours as needed for mild pain.     albuterol (PROAIR HFA) 108 (90 Base) MCG/ACT inhaler Inhale 2 puffs into the lungs every 6 (six) hours as needed for wheezing. 8.5 g 2   amLODipine (NORVASC) 2.5 MG tablet Take 2.5 mg by mouth daily.     Elastic Bandages & Supports (ABDOMINAL BINDER/ELASTIC LARGE) MISC 1 each by Does not apply route daily. 1 each 0   guaiFENesin (MUCINEX) 600 MG 12 hr tablet Take 600 mg by mouth every 4 (four) hours as needed for cough or to loosen phlegm.     levothyroxine (  SYNTHROID) 50 MCG tablet TAKE 1 TABLET DAILY 90 tablet 0   metoprolol succinate (TOPROL-XL) 50 MG 24 hr tablet Take 1 tablet by mouth at night 90 tablet 3   montelukast (SINGULAIR) 10 MG tablet TAKE 1 TABLET DAILY 90 tablet 3   Olopatadine HCl 0.2 % SOLN Place 1 drop into both eyes daily as needed (allergies).     pantoprazole (PROTONIX) 40 MG tablet TAKE 1 TABLET DAILY 90 tablet 0   simvastatin (ZOCOR) 10 MG tablet TAKE ONE TABLET AT BEDTIME 90 tablet 3   warfarin (COUMADIN) 5 MG tablet TAKE ONE TABLET ONCE DAILY AT 4PM 90 tablet 0   No current facility-administered medications for this visit.     Objective: Vital signs in last 24 hours: BP 117/66    Pulse 78   Intake/Output from previous day: No intake/output data recorded. Intake/Output this shift: '@IOTHISSHIFT'$ @   Physical Exam  Lab Results:  Results for orders placed or performed in visit on 06/10/21 (from the past 24 hour(s))  Urinalysis, Routine w reflex microscopic     Status: Abnormal   Collection Time: 06/10/21  2:14 PM  Result Value Ref Range   Specific Gravity, UA >1.030 (H) 1.005 - 1.030   pH, UA 5.5 5.0 - 7.5   Color, UA Yellow Yellow   Appearance Ur Clear Clear   Leukocytes,UA Negative Negative   Protein,UA Trace (A) Negative/Trace   Glucose, UA Negative Negative   Ketones, UA Negative Negative   RBC, UA Negative Negative   Bilirubin, UA Negative Negative   Urobilinogen, Ur 0.2 0.2 - 1.0 mg/dL   Nitrite, UA  Negative Negative   Microscopic Examination Comment    Narrative   Performed at:  [facility] [address] [place], [place]  [id number] Lab Director: Danielson, Phone:  [phone]    BMET No results for input(s): NA, K, CL, CO2, GLUCOSE, BUN, CREATININE, CALCIUM in the last 72 hours. PT/INR Recent Labs    06/10/21 1004  INR 1.8*   ABG No results for input(s): PHART, HCO3 in the last 72 hours.  Invalid input(s): PCO2, PO2 PSA from [facility] reviewed and noted above.   Studies/Results: No results found.   Assessment/Plan: BPH with BOO and primarily OAB symptoms.   I discussed options including alpha blockers, anticholinergics, beta 3 agonists and PTNS.   I will give him samples of Gemtesa as that has the lowest side effect profile.  He will return in 4-6wks for a PVR.    Elevated PSA.   This dropped from 7.1 to 2.91 after he recovered from [place].   He needs no further PSA screening.    Meds ordered this encounter  Medications   Vibegron (GEMTESA) 75 MG TABS    Sig: Take 75 mg by mouth daily.    Dispense:  42 tablet    Refill:  0     Orders Placed This Encounter  Procedures   Urinalysis, Routine w reflex microscopic   BLADDER SCAN AMB NON-IMAGING     Return in about 4 weeks (around 07/08/2021) for 4-6 weeks with PVR.    CC: Dr. Vonna Kotyk Dettinger.       Irine Seal 06/11/2021 [phone]

## 2021-06-10 NOTE — Progress Notes (Signed)
Uroflow ? ?Peak Flow: 20m ?Average Flow: 467m?Voided Volume: 543mVoiding Time: 11sec ?Flow Time: 11sec ?Time to Peak Flow: 3sec ? ?PVR Volume: 0ml58m

## 2021-06-11 LAB — URINALYSIS, ROUTINE W REFLEX MICROSCOPIC
Bilirubin, UA: NEGATIVE
Glucose, UA: NEGATIVE
Ketones, UA: NEGATIVE
Leukocytes,UA: NEGATIVE
Nitrite, UA: NEGATIVE
RBC, UA: NEGATIVE
Specific Gravity, UA: >1.03 — ABNORMAL HIGH (ref 1.005–1.030)
Urobilinogen, Ur: 0.2 mg/dL (ref 0.2–1.0)
pH, UA: 5.5 (ref 5.0–7.5)

## 2021-06-14 DIAGNOSIS — M25511 Pain in right shoulder: Secondary | ICD-10-CM | POA: Diagnosis not present

## 2021-06-23 ENCOUNTER — Ambulatory Visit: Payer: Medicare Other | Admitting: Cardiovascular Disease

## 2021-06-28 ENCOUNTER — Other Ambulatory Visit: Payer: Self-pay

## 2021-06-28 ENCOUNTER — Ambulatory Visit: Payer: Medicare Other

## 2021-06-28 DIAGNOSIS — Z5181 Encounter for therapeutic drug level monitoring: Secondary | ICD-10-CM | POA: Diagnosis not present

## 2021-06-28 DIAGNOSIS — Z9889 Other specified postprocedural states: Secondary | ICD-10-CM

## 2021-06-28 DIAGNOSIS — I71019 Dissection of thoracic aorta, unspecified: Secondary | ICD-10-CM

## 2021-06-28 LAB — POCT INR: INR: 3 (ref 2.0–3.0)

## 2021-06-28 NOTE — Patient Instructions (Signed)
-   resume taking 1 tablet daily except 1/2 tablet each Sunday.   ?- Repeat INR in 8 weeks ? ?

## 2021-07-01 ENCOUNTER — Telehealth: Payer: Self-pay | Admitting: Cardiovascular Disease

## 2021-07-01 NOTE — Telephone Encounter (Signed)
Patient is calling requesting Mariann Laster send in a prescription for his CPAP and supplies to [facility]. He says he no longer wishes to go with [facility].  ?

## 2021-07-02 DIAGNOSIS — G4733 Obstructive sleep apnea (adult) (pediatric): Secondary | ICD-10-CM | POA: Diagnosis not present

## 2021-07-02 DIAGNOSIS — R0683 Snoring: Secondary | ICD-10-CM | POA: Diagnosis not present

## 2021-07-02 NOTE — Telephone Encounter (Signed)
Patient following up from call from yesterday.  ?

## 2021-07-02 NOTE — Telephone Encounter (Signed)
Returned a call to the patient. He states that he is not satisfied with [facility] and asks that  I send a order for his replacement CPAP  to [facility]. The patient was currently at [facility] and I was given a fax # to send the request to. [phone].) ?

## 2021-07-06 DIAGNOSIS — M25511 Pain in right shoulder: Secondary | ICD-10-CM | POA: Diagnosis not present

## 2021-07-07 ENCOUNTER — Other Ambulatory Visit: Payer: Self-pay | Admitting: Cardiovascular Disease

## 2021-07-07 ENCOUNTER — Other Ambulatory Visit: Payer: Self-pay | Admitting: Family Medicine

## 2021-07-08 ENCOUNTER — Ambulatory Visit (INDEPENDENT_AMBULATORY_CARE_PROVIDER_SITE_OTHER): Payer: Medicare Other | Admitting: Family Medicine

## 2021-07-08 ENCOUNTER — Encounter: Payer: Self-pay | Admitting: Family Medicine

## 2021-07-08 VITALS — BP 110/67 | HR 78 | Ht 69.0 in | Wt 183.0 lb

## 2021-07-08 DIAGNOSIS — Z23 Encounter for immunization: Secondary | ICD-10-CM

## 2021-07-08 DIAGNOSIS — I480 Paroxysmal atrial fibrillation: Secondary | ICD-10-CM

## 2021-07-08 DIAGNOSIS — N400 Enlarged prostate without lower urinary tract symptoms: Secondary | ICD-10-CM

## 2021-07-08 DIAGNOSIS — I10 Essential (primary) hypertension: Secondary | ICD-10-CM | POA: Diagnosis not present

## 2021-07-08 DIAGNOSIS — E7800 Pure hypercholesterolemia, unspecified: Secondary | ICD-10-CM

## 2021-07-08 DIAGNOSIS — K219 Gastro-esophageal reflux disease without esophagitis: Secondary | ICD-10-CM | POA: Diagnosis not present

## 2021-07-08 DIAGNOSIS — E039 Hypothyroidism, unspecified: Secondary | ICD-10-CM | POA: Diagnosis not present

## 2021-07-08 MED ORDER — WARFARIN SODIUM 5 MG PO TABS: ORAL_TABLET | ORAL | 3 refills | Status: DC

## 2021-07-08 MED ORDER — PANTOPRAZOLE SODIUM 40 MG PO TBEC: DELAYED_RELEASE_TABLET | ORAL | 3 refills | Status: DC

## 2021-07-08 MED ORDER — LEVOTHYROXINE SODIUM 50 MCG PO TABS: 50.0000 ug | ORAL_TABLET | Freq: Every day | ORAL | 3 refills | Status: DC

## 2021-07-08 NOTE — Progress Notes (Signed)
? ?BP 110/67   Pulse 78   Ht 5' 9"  (1.753 m)   Wt 183 lb (83 kg)   SpO2 98%   BMI 27.02 kg/m?   ? ?Subjective:  ? ?Patient ID: Patrick Bell, male    DOB: 11-29-1940, 81 y.o.   MRN: [id number] ? ?HPI: ?Patrick Bell is a 81 y.o. male presenting on 07/08/2021 for Medical Management of Chronic Issues, Hypertension, Hypothyroidism, and Hyperlipidemia ? ? ?HPI ?Hypothyroidism recheck ?Patient is coming in for thyroid recheck today as well. They deny any issues with hair changes or heat or cold problems or diarrhea or constipation. They deny any chest pain or palpitations. They are currently on levothyroxine 50 micrograms  ? ?Hyperlipidemia ?Patient is coming in for recheck of his hyperlipidemia. The patient is currently taking simvastatin. They deny any issues with myalgias or history of liver damage from it. They deny any focal numbness or weakness or chest pain.  ? ?GERD ?Patient is currently on pantoprazole.  She denies any major symptoms or abdominal pain or belching or burping. She denies any blood in her stool or lightheadedness or dizziness.  ? ?Hypertension ?Patient is currently on amlodipine and metoprolol, and their blood pressure today is 110/60. Patient denies any lightheadedness or dizziness. Patient denies headaches, blurred vision, chest pains, shortness of breath, or weakness. Denies any side effects from medication and is content with current medication.  ? ?A-fib ?Patient is on Coumadin for A-fib and sees cardiology for this. ? ?Relevant past medical, surgical, family and social history reviewed and updated as indicated. Interim medical history since our last visit reviewed. ?Allergies and medications reviewed and updated. ? ?Review of Systems  ?Constitutional:  Negative for chills and fever.  ?Eyes:  Negative for visual disturbance.  ?Respiratory:  Negative for shortness of breath and wheezing.   ?Cardiovascular:  Negative for chest pain and leg swelling.  ?Musculoskeletal:  Negative for back  pain and gait problem.  ?Skin:  Negative for rash.  ?Neurological:  Negative for dizziness and weakness.  ?All other systems reviewed and are negative. ? ?Per HPI unless specifically indicated above ? ? ?Allergies as of 07/08/2021   ?No Known Allergies ?  ? ?  ?Medication List  ?  ? ?  ? Accurate as of July 08, 2021 11:12 AM. If you have any questions, ask your nurse or doctor.  ?  ?  ? ?  ? ?STOP taking these medications   ? ?Gemtesa 75 MG Tabs ?Generic drug: Vibegron ?Stopped by: Worthy Rancher, MD ?  ? ?  ? ?TAKE these medications   ? ?Abdominal Binder/Elastic Large Misc ?1 each by Does not apply route daily. ?  ?acetaminophen 325 MG tablet ?Commonly known as: TYLENOL ?Take 1-2 tablets (325-650 mg total) by mouth every 4 (four) hours as needed for mild pain. ?  ?albuterol 108 (90 Base) MCG/ACT inhaler ?Commonly known as: ProAir HFA ?Inhale 2 puffs into the lungs every 6 (six) hours as needed for wheezing. ?  ?amLODipine 2.5 MG tablet ?Commonly known as: NORVASC ?Take 2.5 mg by mouth daily. ?  ?guaiFENesin 600 MG 12 hr tablet ?Commonly known as: [place] ?Take 600 mg by mouth every 4 (four) hours as needed for cough or to loosen phlegm. ?  ?levothyroxine 50 MCG tablet ?Commonly known as: SYNTHROID ?Take 1 tablet (50 mcg total) by mouth daily. ?  ?metoprolol succinate 50 MG 24 hr tablet ?Commonly known as: TOPROL-XL ?Take 1 tablet by mouth at night ?  ?montelukast  10 MG tablet ?Commonly known as: SINGULAIR ?TAKE 1 TABLET DAILY ?  ?Olopatadine HCl 0.2 % Soln ?Place 1 drop into both eyes daily as needed (allergies). ?  ?pantoprazole 40 MG tablet ?Commonly known as: PROTONIX ?TAKE 1 TABLET DAILY ?  ?simvastatin 10 MG tablet ?Commonly known as: ZOCOR ?TAKE ONE TABLET AT BEDTIME ?  ?warfarin 5 MG tablet ?Commonly known as: COUMADIN ?Take as directed by the anticoagulation clinic. If you are unsure how to take this medication, talk to your nurse or doctor. ?Original instructions: TAKE ONE TABLET ONCE DAILY AT 4PM ?  ? ?   ? ? ? ?Objective:  ? ?BP 110/67   Pulse 78   Ht 5' 9"  (1.753 m)   Wt 183 lb (83 kg)   SpO2 98%   BMI 27.02 kg/m?   ?Wt Readings from Last 3 Encounters:  ?07/08/21 183 lb (83 kg)  ?05/19/21 182 lb (82.6 kg)  ?05/05/21 182 lb 6.4 oz (82.7 kg)  ?  ?Physical Exam ?Vitals and nursing note reviewed.  ?Constitutional:   ?   General: He is not in acute distress. ?   Appearance: He is well-developed. He is not diaphoretic.  ?Eyes:  ?   General: No scleral icterus. ?   Conjunctiva/sclera: Conjunctivae normal.  ?Neck:  ?   Thyroid: No thyromegaly.  ?Cardiovascular:  ?   Rate and Rhythm: Normal rate and regular rhythm.  ?   Heart sounds: Normal heart sounds. No murmur heard. ?Pulmonary:  ?   Effort: Pulmonary effort is normal. No respiratory distress.  ?   Breath sounds: Normal breath sounds. No wheezing.  ?Musculoskeletal:     ?   General: Normal range of motion.  ?   Cervical back: Neck supple.  ?Lymphadenopathy:  ?   Cervical: No cervical adenopathy.  ?Skin: ?   General: Skin is warm and dry.  ?   Findings: No rash.  ?Neurological:  ?   Mental Status: He is alert and oriented to person, place, and time.  ?   Coordination: Coordination normal.  ?Psychiatric:     ?   Behavior: Behavior normal.  ? ? ? ? ?Assessment & Plan:  ? ?Problem List Items Addressed This Visit   ? ?  ? Cardiovascular and Mediastinum  ? Essential hypertension, benign  ? Relevant Medications  ? warfarin (COUMADIN) 5 MG tablet  ? Other Relevant Orders  ? CMP14+EGFR  ? PAF (paroxysmal atrial fibrillation) ([place])  ? Relevant Medications  ? warfarin (COUMADIN) 5 MG tablet  ?  ? Digestive  ? GERD (gastroesophageal reflux disease) - Primary  ? Relevant Medications  ? pantoprazole (PROTONIX) 40 MG tablet  ? Other Relevant Orders  ? CBC with Differential/Platelet  ? CMP14+EGFR  ?  ? Endocrine  ? Hypothyroidism  ? Relevant Medications  ? levothyroxine (SYNTHROID) 50 MCG tablet  ? Other Relevant Orders  ? TSH  ?  ? Genitourinary  ? BPH (benign prostatic  hyperplasia)  ? Relevant Orders  ? PSA, total and free  ?  ? Other  ? Hyperlipidemia  ? Relevant Medications  ? warfarin (COUMADIN) 5 MG tablet  ? Other Relevant Orders  ? Lipid panel  ? ?Other Visit Diagnoses   ? ? Need for shingles vaccine      ? Relevant Orders  ? Varicella-zoster vaccine IM (Shingrix) (Completed)  ? ?  ?  ?Continue current medicine, sees cardiology for Coumadin management.  We will check blood work today.  He has some fatigue and  discussed that it may be due to metoprolol and to discuss that with cardiology. ?Follow up plan: ?Return in about 6 months (around 01/07/2022), or if symptoms worsen or fail to improve, for Thyroid cholesterol and hypertension. ? ?Counseling provided for all of the vaccine components ?Orders Placed This Encounter  ?Procedures  ? Varicella-zoster vaccine IM (Shingrix)  ? CBC with Differential/Platelet  ? CMP14+EGFR  ? Lipid panel  ? PSA, total and free  ? TSH  ? ? ?Caryl Pina, MD ?Reader ?07/08/2021, 11:12 AM ? ? ? ? ?

## 2021-07-09 LAB — CMP14+EGFR
ALT: 10 IU/L (ref 0–44)
AST: 19 IU/L (ref 0–40)
Albumin/Globulin Ratio: 2.9 — ABNORMAL HIGH (ref 1.2–2.2)
Albumin: 4.3 g/dL (ref 3.6–4.6)
Alkaline Phosphatase: 63 IU/L (ref 44–121)
BUN/Creatinine Ratio: 14 (ref 10–24)
BUN: 16 mg/dL (ref 8–27)
Bilirubin Total: 0.6 mg/dL (ref 0.0–1.2)
CO2: 25 mmol/L (ref 20–29)
Calcium: 9 mg/dL (ref 8.6–10.2)
Chloride: 105 mmol/L (ref 96–106)
Creatinine, Ser: 1.12 mg/dL (ref 0.76–1.27)
Globulin, Total: 1.5 g/dL (ref 1.5–4.5)
Glucose: 84 mg/dL (ref 70–99)
Potassium: 4.3 mmol/L (ref 3.5–5.2)
Sodium: 144 mmol/L (ref 134–144)
Total Protein: 5.8 g/dL — ABNORMAL LOW (ref 6.0–8.5)
eGFR: 66 mL/min/{1.73_m2} (ref >59)

## 2021-07-09 LAB — CBC WITH DIFFERENTIAL/PLATELET
Basophils Absolute: 0.1 10*3/uL (ref 0.0–0.2)
Basos: 1 %
EOS (ABSOLUTE): 0.1 10*3/uL (ref 0.0–0.4)
Eos: 1 %
Hematocrit: 43.6 % (ref 37.5–51.0)
Hemoglobin: 14 g/dL (ref 13.0–17.7)
Immature Grans (Abs): 0 10*3/uL (ref 0.0–0.1)
Immature Granulocytes: 1 %
Lymphocytes Absolute: 0.9 10*3/uL (ref 0.7–3.1)
Lymphs: 18 %
MCH: 27 pg (ref 26.6–33.0)
MCHC: 32.1 g/dL (ref 31.5–35.7)
MCV: 84 fL (ref 79–97)
Monocytes Absolute: 0.5 10*3/uL (ref 0.1–0.9)
Monocytes: 9 %
Neutrophils Absolute: 3.5 10*3/uL (ref 1.4–7.0)
Neutrophils: 70 %
Platelets: 176 10*3/uL (ref 150–450)
RBC: 5.18 x10E6/uL (ref 4.14–5.80)
RDW: 14.9 % (ref 11.6–15.4)
WBC: 5 10*3/uL (ref 3.4–10.8)

## 2021-07-09 LAB — TSH: TSH: 0.88 u[IU]/mL (ref 0.450–4.500)

## 2021-07-09 LAB — PSA, TOTAL AND FREE
PSA, Free Pct: 14.6 %
PSA, Free: 0.57 ng/mL
Prostate Specific Ag, Serum: 3.9 ng/mL (ref 0.0–4.0)

## 2021-07-09 LAB — LIPID PANEL
Chol/HDL Ratio: 2.4 ratio (ref 0.0–5.0)
Cholesterol, Total: 177 mg/dL (ref 100–199)
HDL: 73 mg/dL (ref >39)
LDL Chol Calc (NIH): 92 mg/dL (ref 0–99)
Triglycerides: 65 mg/dL (ref 0–149)
VLDL Cholesterol Cal: 12 mg/dL (ref 5–40)

## 2021-07-19 DIAGNOSIS — M25511 Pain in right shoulder: Secondary | ICD-10-CM | POA: Diagnosis not present

## 2021-07-20 NOTE — Progress Notes (Signed)
?Cardiology Office Note:   ? ?Date:  07/27/2021  ? ?ID:  Patrick Bell, DOB 1940/07/21, MRN [id number] ? ?PCP:  Dettinger, Fransisca Kaufmann, MD ?  ?[facility] Providers ?Cardiologist:  Shelva Majestic, MD ?Cardiology APP:  [place]ora Bott[place], [place] { ?Referring MD: Dettinger, Fransisca Kaufmann, MD  ? ?Chief Complaint  ?Patient presents with  ? Follow-up  ?  HTN  ?dizziness ? ?History of Present Illness:   ? ?Patrick Bell is a 81 y.o. male with a hx of severe MR, PAF, HTN, HLD, OSA, and asthma. Echo 11/2019 with normal EF 60-65%, and normal RV function, severe MR wit moderate holosystolic prolapse of the middle scallop of the posterior leaflet of the mitral valve. TEE confirmed mitral valve prolapse with flail segment involving a portion of the posterior leaflet and severe MR. CT surgery was consulted and he underwent right and left heart cath that showed normal coronary arteries. He underwent mitral valve repair 03/11/20 with Dr. Roxy Manns, which was complicated by an acute type A aortic dissection requiring graft repair of his aorta and resuspension of his native aortic valve. Hospital course further complicated by pericardial effusion requiring a window, Afib treated with amiodarone and coumadin, anemia requiring transfusion, AKI, hypotension, and a new diagnosis of hypothyroidism. He converted back to sinus rhythm after pericardial window. He was discharged to [facility] 03/31/20, discharged home 04/07/20. Follow up CT imaging with CT surgery showed persistent aortic dissection flap through the arch, descending thoracic, abdominal aorta, bilateral iliac arteries, and left common femoral artery.  The dissection flap involves bilateral subclavian arteries, celiac, SMA, and bilateral renal arteries. ? ?Given chronic dissection, Dr. Claiborne Billings increased metoprolol to 50 mg daily. He has a history of BP differential and had follow up CT imaging 09/2020 that showed stable dissection as above with flap involving the left subclavian artery superior  mesenteric and both renal arteries.  ?He was last seen in clinic by Dr. Claiborne Billings 10/06/20. Repeat CT imaging 03/2021 with stable chronic dissection. Pt has a history of self-adjusting or self-discontinuing medications (synthroid, coumadin, BB). ? ?In response to pt self-discontinuing metoprolol: Dr. Claiborne Billings noted  I again have strongly urged that he not adjust his own medications.  With his extensive aortic dissection he is at significant risk for dissection progression off beta-blocker therapy.  I have resume metoprolol succinate 50 mg daily.   ? ?He called our office with reports of dizziness for the last 2 weeks. He stopped his metoprolol but dizziness has persisted for another three days. He was significantly orthostatic 140 --> 108. I felt he was dehydrated and had diarrhea monthly.  Difficult situation as he needs BP control for chronic thoracic and descending aortic dissection to include BB therapy. I opted to stop amlodipine 2.5 mg and restart 50 mg toprol. Labs were largely unremarkable. I also referred to ENT for dizziness. I also encouraged CPAP use. If no improvement with these measures, consider placing heart monitor.  ? ?In the interim, he saw Dr. Cyndia Bent who restarted the 2.5 mg amlodipine for hypertension.  ? ?He returns today for follow up.  He states he received a call from ENT but did not schedule. He is back on amlodipine. He continues to have dizziness every day: standing from sitting but also when walking around. BP mildly up today. BP at home 120-142 / 70-80. I mentioned a heart monitor and he will not wear a heart monitor.  He reports exertional dyspnea and fatigue. Echo 04/2021 reviewed. He has an 80 pack  year history, no longer smoking. Echo does have moderately reduced RV function. He has just received CPAP but mask doesn't fit. He also likely has a component of COPD. He has seen urology for trouble urinating, but he has not started taking the prescribed medication - gemtesa - due to side effects  he read online.  ? ? ?Past Medical History:  ?Diagnosis Date  ? Acute thoracic aortic dissection ([place]) 03/11/2020  ? Type A  ? Allergy   ? Asthma   ? Cataract   ? Cataract   ? Diverticulitis   ? Dyspnea   ? GERD (gastroesophageal reflux disease)   ? Heart murmur 12/2019  ? Hyperlipidemia   ? Hypertension   ? Inguinal hernia bilateral, non-recurrent   ? S/P aortic dissection repair 03/11/2020  ? supracoronary straight graft repair with resuspension of native aortic valve and open distal anastomosis for intraoperative acute type A aortic dissection   ? S/P mitral valve repair 03/11/2020  ? Complex valvuloplasty including artificial Gore-tex neochord placement x 4, suture plication of posterior leaflet and posteromedial commissure and 32 mm Sorin Memo 4D ring annuloplasty  ? Sleep apnea   ? ? ?Past Surgical History:  ?Procedure Laterality Date  ? bilateral inguinal hernia    ? CARDIAC CATHETERIZATION Bilateral 02/21/2020  ? CHEST TUBE INSERTION N/A 03/22/2020  ? Procedure: CHEST TUBE INSERTION OF 28 BLAKE DRAIN.;  Surgeon: Grace Isaac, MD;  Location: [place];  Service: Thoracic;  Laterality: N/A;  ? COLONOSCOPY  06/09/2004  ? [id number] colon diverticulosis, otherwise normal colonoscopy  ? EYE SURGERY  08/2014  ? HERNIA REPAIR    ? MITRAL VALVE REPAIR N/A 03/11/2020  ? Procedure: MITRAL VALVE REPAIR (MVR) USING MEMO 4D SIZE 32 RING;  Surgeon: Rexene Alberts, MD;  Location: [place];  Service: Open Heart Surgery;  Laterality: N/A;  ? PALATE / UVULA BIOPSY / EXCISION    ? PERICARDIAL WINDOW N/A 03/22/2020  ? Procedure: SUBXYPHOID POST-OP PERICARDIAL FLUID DRAINAGE WITH CHEST TUBE INSERTION.;  Surgeon: Grace Isaac, MD;  Location: [place];  Service: Thoracic;  Laterality: N/A;  ? REPAIR OF ACUTE ASCENDING THORACIC AORTIC DISSECTION N/A 03/11/2020  ? Procedure: REPAIR OF ACUTE ASCENDING THORACIC AORTIC DISSECTION USING A HEMASHIELD PLATINUM 26MM STRAIGHT GRAFT AND A HEASHIELD PLATINUM 28X10MM SINGLE ARM GRAFT;   Surgeon: Rexene Alberts, MD;  Location: [place];  Service: Open Heart Surgery;  Laterality: N/A;  ? RIGHT/LEFT HEART CATH AND CORONARY ANGIOGRAPHY N/A 02/21/2020  ? Procedure: RIGHT/LEFT HEART CATH AND CORONARY ANGIOGRAPHY;  Surgeon: Troy Sine, MD;  Location: [place] CV LAB;  Service: Cardiovascular;  Laterality: N/A;  ? TEE WITHOUT CARDIOVERSION N/A 12/19/2019  ? Procedure: TRANSESOPHAGEAL ECHOCARDIOGRAM (TEE);  Surgeon: Buford Dresser, MD;  Location[facility];  Service: Cardiovascular;  Laterality: N/A;  ? TEE WITHOUT CARDIOVERSION N/A 03/11/2020  ? Procedure: TRANSESOPHAGEAL ECHOCARDIOGRAM (TEE);  Surgeon: Rexene Alberts, MD;  Location: [place];  Service: Open Heart Surgery;  Laterality: N/A;  ? TEE WITHOUT CARDIOVERSION N/A 03/22/2020  ? Procedure: TRANSESOPHAGEAL ECHOCARDIOGRAM (TEE);  Surgeon: Grace Isaac, MD;  Location: [facility] OR;  Service: Thoracic;  Laterality: N/A;  ? TONSILLECTOMY    ? VASECTOMY    ? ? ?Current Medications: ?Current Meds  ?Medication Sig  ? acetaminophen (TYLENOL) 325 MG tablet Take 1-2 tablets (325-650 mg total) by mouth every 4 (four) hours as needed for mild pain.  ? albuterol (PROAIR HFA) 108 (90 Base) MCG/ACT inhaler Inhale 2 puffs into the  lungs every 6 (six) hours as needed for wheezing.  ? amLODipine (NORVASC) 2.5 MG tablet Take 2.5 mg by mouth daily.  ? Elastic Bandages & Supports (ABDOMINAL BINDER/ELASTIC LARGE) MISC 1 each by Does not apply route daily.  ? guaiFENesin (MUCINEX) 600 MG 12 hr tablet Take 600 mg by mouth every 4 (four) hours as needed for cough or to loosen phlegm.  ? levothyroxine (SYNTHROID) 50 MCG tablet Take 1 tablet (50 mcg total) by mouth daily.  ? metoprolol succinate (TOPROL-XL) 50 MG 24 hr tablet Take 1 tablet by mouth at night  ? montelukast (SINGULAIR) 10 MG tablet TAKE 1 TABLET DAILY  ? Olopatadine HCl 0.2 % SOLN Place 1 drop into both eyes daily as needed (allergies).  ? pantoprazole (PROTONIX) 40 MG tablet TAKE 1 TABLET DAILY   ? simvastatin (ZOCOR) 10 MG tablet TAKE ONE TABLET AT BEDTIME  ? warfarin (COUMADIN) 5 MG tablet TAKE ONE TABLET ONCE DAILY AT 4PM  ?  ? ?Allergies:   Patient has no known allergies.  ? ?Social History  ? ?[place]

## 2021-07-21 DIAGNOSIS — R0683 Snoring: Secondary | ICD-10-CM | POA: Diagnosis not present

## 2021-07-21 DIAGNOSIS — G4733 Obstructive sleep apnea (adult) (pediatric): Secondary | ICD-10-CM | POA: Diagnosis not present

## 2021-07-22 ENCOUNTER — Telehealth: Payer: Self-pay | Admitting: *Deleted

## 2021-07-22 NOTE — Telephone Encounter (Signed)
Patient called in to say that he got his new CPAP machine yesterday. He states that he did not sleep but 21 minutes with it last night due to the mask leaking. He has a Runner, broadcasting/film/video. He also had concerns that "air is coming out of the holes in the front of the mask." I told him that all masks have the holes so as to keep the patients from breathing in carbon monoxide from the mask covering the face. This is a escape port. Recommendations were made to help him with the mask leak. He was instructed to try it again tonight. If he is still having issues with the mask, he can come by the office tomorrow and I will work with him to try to get him a sample mask to try. Patient agrees with this plan and will call me tomorrow. ?

## 2021-07-27 ENCOUNTER — Ambulatory Visit: Payer: Medicare Other | Admitting: Physician Assistant

## 2021-07-27 ENCOUNTER — Encounter: Payer: Self-pay | Admitting: Physician Assistant

## 2021-07-27 VITALS — BP 148/72 | HR 73 | Ht 70.0 in | Wt 186.6 lb

## 2021-07-27 DIAGNOSIS — K219 Gastro-esophageal reflux disease without esophagitis: Secondary | ICD-10-CM | POA: Diagnosis not present

## 2021-07-27 DIAGNOSIS — Z9889 Other specified postprocedural states: Secondary | ICD-10-CM | POA: Diagnosis not present

## 2021-07-27 DIAGNOSIS — I519 Heart disease, unspecified: Secondary | ICD-10-CM

## 2021-07-27 DIAGNOSIS — I480 Paroxysmal atrial fibrillation: Secondary | ICD-10-CM | POA: Diagnosis not present

## 2021-07-27 DIAGNOSIS — I951 Orthostatic hypotension: Secondary | ICD-10-CM | POA: Diagnosis not present

## 2021-07-27 DIAGNOSIS — R42 Dizziness and giddiness: Secondary | ICD-10-CM

## 2021-07-27 DIAGNOSIS — I10 Essential (primary) hypertension: Secondary | ICD-10-CM

## 2021-07-27 NOTE — Addendum Note (Signed)
Addended by: Merri Ray A on: 07/27/2021 10:38 AM ? ? Modules accepted: Orders ? ?

## 2021-07-27 NOTE — Patient Instructions (Signed)
Medication Instructions:  ?None ?*If you need a refill on your cardiac medications before your next appointment, please call your pharmacy* ? ? ?Lab Work: ?None ?If you have labs (blood work) drawn today and your tests are completely normal, you will receive your results only by: ?MyChart Message (if you have MyChart) OR ?A paper copy in the mail ?If you have any lab test that is abnormal or we need to change your treatment, we will call you to review the results. ? ? ?Testing/Procedures: ?None ? ? ?Follow-Up: ?At [facility], you and your health needs are our priority.  As part of our continuing mission to provide you with exceptional heart care, we have created designated Provider Care Teams.  These Care Teams include your primary Cardiologist (physician) and Advanced Practice Providers (APPs -  Physician Assistants and Nurse Practitioners) who all work together to provide you with the care you need, when you need it. ? ?We recommend signing up for the patient portal called "MyChart".  Sign up information is provided on this After Visit Summary.  MyChart is used to connect with patients for Virtual Visits (Telemedicine).  Patients are able to view lab/test results, encounter notes, upcoming appointments, etc.  Non-urgent messages can be sent to your provider as well.   ?To learn more about what you can do with MyChart, go to [email or web address].   ? ?Your next appointment:   ?6 month(s) ? ?The format for your next appointment:   ?In Person ? ?Provider:   ?Shelva Majestic, MD   ? ? ?Other Instructions: ? ?Please Call Office if Systolic Bllod Pressure Greater Than 140. ? ?Important Information About Sugar ? ? ? ? ?  ?

## 2021-07-28 DIAGNOSIS — M25511 Pain in right shoulder: Secondary | ICD-10-CM | POA: Diagnosis not present

## 2021-07-29 ENCOUNTER — Ambulatory Visit: Payer: Medicare Other | Admitting: Urology

## 2021-07-29 ENCOUNTER — Encounter: Payer: Self-pay | Admitting: Urology

## 2021-07-29 VITALS — BP 138/67 | HR 76

## 2021-07-29 DIAGNOSIS — N401 Enlarged prostate with lower urinary tract symptoms: Secondary | ICD-10-CM

## 2021-07-29 DIAGNOSIS — M9905 Segmental and somatic dysfunction of pelvic region: Secondary | ICD-10-CM | POA: Diagnosis not present

## 2021-07-29 DIAGNOSIS — M9903 Segmental and somatic dysfunction of lumbar region: Secondary | ICD-10-CM | POA: Diagnosis not present

## 2021-07-29 DIAGNOSIS — R3915 Urgency of urination: Secondary | ICD-10-CM

## 2021-07-29 DIAGNOSIS — N138 Other obstructive and reflux uropathy: Secondary | ICD-10-CM

## 2021-07-29 DIAGNOSIS — M5137 Other intervertebral disc degeneration, lumbosacral region: Secondary | ICD-10-CM | POA: Diagnosis not present

## 2021-07-29 DIAGNOSIS — M9904 Segmental and somatic dysfunction of sacral region: Secondary | ICD-10-CM | POA: Diagnosis not present

## 2021-07-29 LAB — MICROSCOPIC EXAMINATION
Epithelial Cells (non renal): NONE SEEN /hpf (ref 0–10)
Renal Epithel, UA: NONE SEEN /hpf
WBC, UA: NONE SEEN /hpf (ref 0–5)

## 2021-07-29 LAB — URINALYSIS, ROUTINE W REFLEX MICROSCOPIC
Bilirubin, UA: NEGATIVE
Glucose, UA: NEGATIVE
Ketones, UA: NEGATIVE
Leukocytes,UA: NEGATIVE
Nitrite, UA: NEGATIVE
Specific Gravity, UA: >1.03 — ABNORMAL HIGH (ref 1.005–1.030)
Urobilinogen, Ur: 0.2 mg/dL (ref 0.2–1.0)
pH, UA: 6 (ref 5.0–7.5)

## 2021-07-29 LAB — BLADDER SCAN AMB NON-IMAGING: Scan Result: 0

## 2021-07-29 NOTE — Progress Notes (Signed)
?Subjective: ?1. BPH with urinary obstruction   ?2. Urgency of urination   ?  ?07/29/21: Patrick Bell returns today in f/u.  He never tried the [place] because of concerns about side effects, in particular headache.  He continues to have issues with urgency.  His PVR today is 2 ml.   ? ?06/10/21: Patrick Bell is an 81 yo male who I saw in [place] on 02/21/21 with a PSA of 7.1 following a bout of COVID.  A repeat level on 04/28/21 was down to 2.91 which is around his prior baseline level.  He has BPH with BOO and was given tamsulosin to try but he doesn't think he took it.  He had frequency, urgency, nocturia and a sensation of incomplete emptying with an IPSS of 20 at his last visit.  He had some bacteriuria but a negative culture at his visit on 02/21/21.  A PVR today is minimal and his PF is 44m/sec with only 531mvoided.  He has some issues with urgency. ?ROS: ? ?Review of Systems  ?Respiratory:  Positive for shortness of breath.   ?Musculoskeletal:  Positive for back pain.  ?Neurological:  Positive for dizziness.  ?Psychiatric/Behavioral:  Positive for memory loss.   ? ?No Known Allergies ? ?Past Medical History:  ?Diagnosis Date  ? [place]  ? Type A  ? Allergy   ? Asthma   ? Cataract   ? Cataract   ? Diverticulitis   ? Dyspnea   ? GERD (gastroesophageal reflux disease)   ? Heart murmur 12/2019  ? Hyperlipidemia   ? Hypertension   ? Inguinal hernia bilateral, non-recurrent   ? S/P aortic dissection repair 03/11/2020  ? supracoronary straight graft repair with resuspension of native aortic valve and open distal anastomosis for intraoperative acute type A aortic dissection   ? S/P mitral valve repair 03/11/2020  ? Complex valvuloplasty including artificial Gore-tex neochord placement x 4, suture plication of posterior leaflet and posteromedial commissure and 32 mm Sorin Memo 4D ring annuloplasty  ? Sleep apnea   ? ? ?Past Surgical History:  ?Procedure Laterality Date  ? bilateral  inguinal hernia    ? CARDIAC CATHETERIZATION Bilateral 02/21/2020  ? CHEST TUBE INSERTION N/A 03/22/2020  ? Procedure: CHEST TUBE INSERTION OF 28 BLAKE DRAIN.;  Surgeon: GeGrace IsaacMD;  [facility] Thor[facility];  Laterality: N/A;  ? COLONOSCOPY  06/09/2004  ? NUFFM:BWGYKZLolon diverticulosis, otherwise normal colonoscopy  ? EYE SURGERY  08/2014  ? HERNIA REPAIR    ? MITRAL VALVE REPAIR N/A 03/11/2020  ? Procedure: MITRAL VALVE REPAIR (MVR) USING MEMO 4D SIZE 32 RING;  Surgeon: OwRexene AlbertsMD;  [facility] Service: Open [facility] Surgery;  Laterality: N/A;  ? PALATE / UVULA BIOPSY / EXCISION    ? PERICARDIAL WINDOW N/A 03/22/2020  ? Procedure: SUBXYPHOID POST-OP PERICARDIAL FLUID DRAINAGE WITH CHEST TUBE INSERTION.;  Surgeon: GeGrace IsaacMD;  [place] [facility] Service: Thoracic;  Laterality: N/A;  ? REPAIR OF ACUTE ASCENDING THORACIC AORTIC DISSECTION N/A 03/11/2020  ? Procedure: REPAIR OF ACUTE ASCENDING THORACIC AORTIC DISSECTION USING A HEMASHIELD PLATINUM 26MM STRAIGHT GRAFT AND A HEASHIELD PLATINUM 28X10MM SINGLE ARM GRAFT;  Surgeon: OwRexene AlbertsMD;  [place] [facility] Service: Open Heart Surgery;  Laterality: N/A;  ? RIGHT/LEFT HEART CATH AND CORONARY ANGIOGRAPHY N/A 02/21/2020  ? Procedure: RIGHT/LEFT HEART CATH AND CORONARY ANGIOGRAPHY;  Surgeon: KeTroy SineMD;  [facility];  Service: Cardiovascular;  Laterality: N/A;  ? TEE WITHOUT CARDIOVERSION N/A 12/19/2019  ? Procedure: TRANSESOPHAGEAL ECHOCARDIOGRAM (TEE);  Surgeon: Buford Dresser, MD;  Location: [facility] ENDOSCOPY;  Service: Cardiovascular;  Laterality: N/A;  ? TEE WITHOUT CARDIOVERSION N/A 03/11/2020  ? Procedure: TRANSESOPHAGEAL ECHOCARDIOGRAM (TEE);  Surgeon: Rexene Alberts, MD;  Location: [place];  Service: Open Heart Surgery;  Laterality: N/A;  ? TEE WITHOUT CARDIOVERSION N/A 03/22/2020  ? Procedure: TRANSESOPHAGEAL ECHOCARDIOGRAM (TEE);  Surgeon: Grace Isaac, MD;  Location: [facility]  OR;  Service: Thoracic;  Laterality: N/A;  ? TONSILLECTOMY    ? VASECTOMY    ? ? ?Social History  ? ?Socioeconomic History  ? Marital status: Married  ?  Spouse name: Wells Guiles  ? Number of children: 2  ? Years of education: 54  ? Highest education level: Some college, no degree  ?Occupational History  ? Occupation: retired  ?  Employer: [employer]  ?  Comment: truck driving  ?Tobacco Use  ? Smoking status: Former  ?  Packs/day: 4.00  ?  Years: 25.00  ?  Pack years: 100.00  ?  Types: Cigarettes  ?  Quit date: 06/23/1982  ?  Years since quitting: 39.1  ? Smokeless tobacco: Never  ? Tobacco comments:  ?  pt was a truck driver  ?Vaping Use  ? Vaping Use: Never used  ?Substance and Sexual Activity  ? Alcohol use: Yes  ?  Alcohol/week: 5.0 standard drinks  ?  Types: 5 Shots of liquor per week  ?  Comment: brandy a few times a month, beer once a month  ? Drug use: No  ? Sexual activity: Yes  ?  Birth control/protection: Surgical  ?Other Topics Concern  ? Not on file  ?Social History Narrative  ? Not on file  ? ?Social Determinants of Health  ? ?Financial Resource Strain: Not on file  ?Food Insecurity: Not on file  ?Transportation Needs: Not on file  ?Physical Activity: Insufficiently Active  ? Days of Exercise per Week: 4 days  ? Minutes of Exercise per Session: 30 min  ?Stress: No Stress Concern Present  ? Feeling of Stress : Not at all  ?Social Connections: Not on file  ?Intimate Partner Violence: Not on file  ? ? ?Family History  ?Problem Relation Age of Onset  ? Heart disease Mother   ? Hip fracture Mother 77  ? Osteoporosis Mother   ? Dementia Mother 50  ? Arthritis Father   ? Heart disease Father   ? Arthritis Sister   ? Arthritis Brother   ? Colon cancer Neg Hx   ? ? ?Anti-infectives: ?Anti-infectives (From admission, onward)  ? ? None  ? ?  ? ? ?Current Outpatient Medications  ?Medication Sig Dispense Refill  ? acetaminophen (TYLENOL) 325 MG tablet Take 1-2 tablets (325-650 mg total) by mouth every 4 (four) hours  as needed for mild pain.    ? albuterol (PROAIR HFA) 108 (90 Base) MCG/ACT inhaler Inhale 2 puffs into the lungs every 6 (six) hours as needed for wheezing. 8.5 g 2  ? amLODipine (NORVASC) 2.5 MG tablet Take 2.5 mg by mouth daily.    ? Elastic Bandages & Supports (ABDOMINAL BINDER/ELASTIC LARGE) MISC 1 each by Does not apply route daily. 1 each 0  ? guaiFENesin (MUCINEX) 600 MG 12 hr tablet Take 600 mg by mouth every 4 (four) hours as needed for cough or to loosen phlegm.    ? levothyroxine (SYNTHROID) 50 MCG tablet  Take 1 tablet (50 mcg total) by mouth daily. 90 tablet 3  ? metoprolol succinate (TOPROL-XL) 50 MG 24 hr tablet Take 1 tablet by mouth at night 90 tablet 3  ? montelukast (SINGULAIR) 10 MG tablet TAKE 1 TABLET DAILY 90 tablet 3  ? Olopatadine HCl 0.2 % SOLN Place 1 drop into both eyes daily as needed (allergies).    ? pantoprazole (PROTONIX) 40 MG tablet TAKE 1 TABLET DAILY 90 tablet 3  ? simvastatin (ZOCOR) 10 MG tablet TAKE ONE TABLET AT BEDTIME 90 tablet 3  ? warfarin (COUMADIN) 5 MG tablet TAKE ONE TABLET ONCE DAILY AT 4PM 90 tablet 3  ? ?No current facility-administered medications for this visit.  ? ? ? ?Objective: ?Vital signs in last 24 hours: ?BP 138/67   Pulse 76  ? ?Intake/Output from previous day: ?No intake/output data recorded. ?Intake/Output this shift: ?'@IOTHISSHIFT'$ @ ? ? ?Physical Exam ? ?Lab Results:  ?Results for orders placed or performed in visit on 07/29/21 (from the past 24 hour(s))  ?Urinalysis, Routine w reflex microscopic     Status: Abnormal  ? Collection Time: 07/29/21 10:43 AM  ?Result Value Ref Range  ? Specific Gravity, UA >1.030 (H) 1.005 - 1.030  ? pH, UA 6.0 5.0 - 7.5  ? Color, UA Amber (A) Yellow  ? Appearance Ur Clear Clear  ? Leukocytes,UA Negative Negative  ? Protein,UA Trace (A) Negative/Trace  ? Glucose, UA Negative Negative  ? Ketones, UA Negative Negative  ? RBC, UA Trace (A) Negative  ? Bilirubin, UA Negative Negative  ? Urobilinogen, Ur 0.2 0.2 - 1.0 mg/dL  ?  Nitrite, UA Negative Negative  ? Microscopic Examination See below:   ? Narrative  ? Performed at:  [facility] ?[address], [place], [place]  [id number] ?Lab Director: Mina Marble

## 2021-07-29 NOTE — Progress Notes (Signed)
post void residual=0 ?

## 2021-07-30 ENCOUNTER — Telehealth: Payer: Self-pay | Admitting: Cardiovascular Disease

## 2021-07-30 NOTE — Telephone Encounter (Signed)
Returned call to pt he states that he does not know who the company is that provided him his CPAP machine. He states that he is unable to sleep more than a couple hours because the pressure is too high on his machine and his mask leaks. Mariann Laster told him to call anytime to discuss a new mask. He states that "all he needs" is either get a new mask or adjust his pressures, he tried to adjust but he is unable. Informed him that only TK can adjust these settings. Informed pt that TK is on vacation this week and Monday he is in the [place] lab. Verbalized understanding. ?

## 2021-07-30 NOTE — Telephone Encounter (Signed)
Pt states that he has a new CPAP machine and is having a hard time getting it set up. Pt also stated that he would like to see Dr. Claiborne Billings next week is possible. Please advise ?

## 2021-08-02 ENCOUNTER — Other Ambulatory Visit: Payer: Self-pay | Admitting: Family Medicine

## 2021-08-02 DIAGNOSIS — M9905 Segmental and somatic dysfunction of pelvic region: Secondary | ICD-10-CM | POA: Diagnosis not present

## 2021-08-02 DIAGNOSIS — M5137 Other intervertebral disc degeneration, lumbosacral region: Secondary | ICD-10-CM | POA: Diagnosis not present

## 2021-08-02 DIAGNOSIS — M9903 Segmental and somatic dysfunction of lumbar region: Secondary | ICD-10-CM | POA: Diagnosis not present

## 2021-08-02 DIAGNOSIS — M9904 Segmental and somatic dysfunction of sacral region: Secondary | ICD-10-CM | POA: Diagnosis not present

## 2021-08-02 NOTE — Telephone Encounter (Signed)
Last OV 07/08/21. Last RF historical provider. Next OV 01/07/22 ? ?

## 2021-08-03 NOTE — Telephone Encounter (Signed)
Called the patient to see what issues he is having with his CPAP. He informs me that he is experiencing a mask leak. States that he slept 6 hours last night, however it didn't start to leak until about 15 mins before he was getting ready to turn it off. A D/L report was pulled and it does not really show a leak it does however show that he is having 7.3 AHI"s in which 2.4 are centrals. Download was shown to Dr Claiborne Billings and he ordered his pressure to be changed to 15-20 CmH2O. Patient notified of the change made. ?

## 2021-08-04 ENCOUNTER — Telehealth: Payer: Self-pay | Admitting: Physician Assistant

## 2021-08-04 DIAGNOSIS — M9904 Segmental and somatic dysfunction of sacral region: Secondary | ICD-10-CM | POA: Diagnosis not present

## 2021-08-04 DIAGNOSIS — M9905 Segmental and somatic dysfunction of pelvic region: Secondary | ICD-10-CM | POA: Diagnosis not present

## 2021-08-04 DIAGNOSIS — M5137 Other intervertebral disc degeneration, lumbosacral region: Secondary | ICD-10-CM | POA: Diagnosis not present

## 2021-08-04 DIAGNOSIS — M9903 Segmental and somatic dysfunction of lumbar region: Secondary | ICD-10-CM | POA: Diagnosis not present

## 2021-08-04 NOTE — Telephone Encounter (Signed)
?*  STAT* If patient is at the pharmacy, call can be transferred to refill team. ? ? ?1. Which medications need to be refilled? (please list name of each medication and dose if known) amLODipine (NORVASC) 2.5 MG tablet ? ?2. Which pharmacy/location (including street and city if local pharmacy) is medication to be sent to? [place], [place] ? ?3. Do they need a 30 day or 90 day supply? 90  ?

## 2021-08-04 NOTE — Telephone Encounter (Signed)
ALREADY SENT BY PCP TODAY ?

## 2021-08-06 ENCOUNTER — Encounter: Payer: Self-pay | Admitting: Family Medicine

## 2021-08-06 ENCOUNTER — Ambulatory Visit (INDEPENDENT_AMBULATORY_CARE_PROVIDER_SITE_OTHER): Payer: Medicare Other | Admitting: Family Medicine

## 2021-08-06 VITALS — BP 129/66 | HR 77 | Ht 70.0 in | Wt 184.0 lb

## 2021-08-06 DIAGNOSIS — I10 Essential (primary) hypertension: Secondary | ICD-10-CM | POA: Diagnosis not present

## 2021-08-06 NOTE — Progress Notes (Signed)
? ?BP 129/66   Pulse 77   Ht '5\' 10"'$  (1.778 m)   Wt 184 lb (83.5 kg)   SpO2 99%   BMI 26.40 kg/m?   ? ?Subjective:  ? ?Patient ID: Patrick Bell, male    DOB: Dec 13, 1940, 81 y.o.   MRN: [id number] ? ?HPI: ?Patrick Bell is a 81 y.o. male presenting on 08/06/2021 for Discuss blood pressure (Cardio would like to be contacted if pts BP is over 140. Pt feels that he feels better when his BP is a little elevated. He believes when it is in the 120's he does not feel well. Pt does feel dizzy at times and off balance. ) ? ? ?HPI ?Hypertension ?Patient is currently on amlodipine and metoprolol, and their blood pressure today is 129/66. Patient has lightheadedness or dizziness. Patient denies headaches, blurred vision, chest pains, shortness of breath, or weakness. Denies any side effects from medication and is content with current medication.  He had discussion with cardiology and review monitoring.  Blood pressure because of his previous aortic problems and he is getting little bit dizzy with this and wants to know if to continue it or not.  He is taking it and has noticed more over the past 3 days that he has had the dizziness.  He does admit to some congestion as well and recommended trying some allergy stuff as well. ? ?Relevant past medical, surgical, family and social history reviewed and updated as indicated. Interim medical history since our last visit reviewed. ?Allergies and medications reviewed and updated. ? ?Review of Systems  ?Constitutional:  Negative for chills and fever.  ?Eyes:  Negative for visual disturbance.  ?Respiratory:  Negative for shortness of breath and wheezing.   ?Cardiovascular:  Negative for chest pain and leg swelling.  ?Musculoskeletal:  Negative for back pain and gait problem.  ?Skin:  Negative for rash.  ?Neurological:  Positive for dizziness and light-headedness. Negative for weakness.  ?All other systems reviewed and are negative. ? ?Per HPI unless specifically indicated  above ? ? ?Allergies as of 08/06/2021   ?No Known Allergies ?  ? ?  ?Medication List  ?  ? ?  ? Accurate as of Aug 06, 2021  2:57 PM. If you have any questions, ask your nurse or doctor.  ?  ?  ? ?  ? ?Abdominal Binder/Elastic Large Misc ?1 each by Does not apply route daily. ?  ?acetaminophen 325 MG tablet ?Commonly known as: TYLENOL ?Take 1-2 tablets (325-650 mg total) by mouth every 4 (four) hours as needed for mild pain. ?  ?albuterol 108 (90 Base) MCG/ACT inhaler ?Commonly known as: ProAir HFA ?Inhale 2 puffs into the lungs every 6 (six) hours as needed for wheezing. ?  ?amLODipine 2.5 MG tablet ?Commonly known as: NORVASC ?TAKE 1 TABLET DAILY ?  ?guaiFENesin 600 MG 12 hr tablet ?Commonly known as: Elnora ?Take 600 mg by mouth every 4 (four) hours as needed for cough or to loosen phlegm. ?  ?levothyroxine 50 MCG tablet ?Commonly known as: SYNTHROID ?Take 1 tablet (50 mcg total) by mouth daily. ?  ?metoprolol succinate 50 MG 24 hr tablet ?Commonly known as: TOPROL-XL ?Take 1 tablet by mouth at night ?  ?montelukast 10 MG tablet ?Commonly known as: SINGULAIR ?TAKE 1 TABLET DAILY ?  ?Olopatadine HCl 0.2 % Soln ?Place 1 drop into both eyes daily as needed (allergies). ?  ?pantoprazole 40 MG tablet ?Commonly known as: PROTONIX ?TAKE 1 TABLET DAILY ?  ?simvastatin 10  MG tablet ?Commonly known as: ZOCOR ?TAKE ONE TABLET AT BEDTIME ?  ?warfarin 5 MG tablet ?Commonly known as: COUMADIN ?Take as directed by the anticoagulation clinic. If you are unsure how to take this medication, talk to your nurse or doctor. ?Original instructions: TAKE ONE TABLET ONCE DAILY AT 4PM ?  ? ?  ? ? ? ?Objective:  ? ?BP 129/66   Pulse 77   Ht '5\' 10"'$  (1.778 m)   Wt 184 lb (83.5 kg)   SpO2 99%   BMI 26.40 kg/m?   ?Wt Readings from Last 3 Encounters:  ?08/06/21 184 lb (83.5 kg)  ?07/27/21 186 lb 9.6 oz (84.6 kg)  ?07/08/21 183 lb (83 kg)  ?  ?Physical Exam ?Vitals and nursing note reviewed.  ?Constitutional:   ?   General: He is not in acute  distress. ?   Appearance: He is well-developed. He is not diaphoretic.  ?Eyes:  ?   General: No scleral icterus. ?   Conjunctiva/sclera: Conjunctivae normal.  ?Neck:  ?   Thyroid: No thyromegaly.  ?Cardiovascular:  ?   Rate and Rhythm: Normal rate and regular rhythm.  ?   Heart sounds: Normal heart sounds. No murmur heard. ?Pulmonary:  ?   Effort: Pulmonary effort is normal. No respiratory distress.  ?   Breath sounds: Normal breath sounds. No wheezing.  ?Musculoskeletal:  ?   Cervical back: Neck supple.  ?Lymphadenopathy:  ?   Cervical: No cervical adenopathy.  ?Skin: ?   General: Skin is warm and dry.  ?   Findings: No rash.  ?Neurological:  ?   Mental Status: He is alert and oriented to person, place, and time.  ?   Coordination: Coordination normal.  ?Psychiatric:     ?   Behavior: Behavior normal.  ? ? ? ? ?Assessment & Plan:  ? ?Problem List Items Addressed This Visit   ? ?  ? Cardiovascular and Mediastinum  ? Essential hypertension, benign - Primary  ?  ?Recommended continue current medicine, if dizziness persists then we can consider ENT but for now continue blood pressure medicine.  Can also add Flonase to see if that helps with allergies. ?Follow up plan: ?Return if symptoms worsen or fail to improve. ? ?Counseling provided for all of the vaccine components ?No orders of the defined types were placed in this encounter. ? ? ?Caryl Pina, MD ?[place] ?08/06/2021, 2:57 PM ? ? ? ? ?

## 2021-08-09 DIAGNOSIS — M5137 Other intervertebral disc degeneration, lumbosacral region: Secondary | ICD-10-CM | POA: Diagnosis not present

## 2021-08-09 DIAGNOSIS — M9903 Segmental and somatic dysfunction of lumbar region: Secondary | ICD-10-CM | POA: Diagnosis not present

## 2021-08-09 DIAGNOSIS — M9905 Segmental and somatic dysfunction of pelvic region: Secondary | ICD-10-CM | POA: Diagnosis not present

## 2021-08-09 DIAGNOSIS — M9904 Segmental and somatic dysfunction of sacral region: Secondary | ICD-10-CM | POA: Diagnosis not present

## 2021-08-11 DIAGNOSIS — M9903 Segmental and somatic dysfunction of lumbar region: Secondary | ICD-10-CM | POA: Diagnosis not present

## 2021-08-11 DIAGNOSIS — M5137 Other intervertebral disc degeneration, lumbosacral region: Secondary | ICD-10-CM | POA: Diagnosis not present

## 2021-08-11 DIAGNOSIS — M9905 Segmental and somatic dysfunction of pelvic region: Secondary | ICD-10-CM | POA: Diagnosis not present

## 2021-08-11 DIAGNOSIS — M9904 Segmental and somatic dysfunction of sacral region: Secondary | ICD-10-CM | POA: Diagnosis not present

## 2021-08-12 ENCOUNTER — Other Ambulatory Visit: Payer: Self-pay

## 2021-08-12 ENCOUNTER — Telehealth: Payer: Self-pay | Admitting: Cardiovascular Disease

## 2021-08-12 DIAGNOSIS — M9905 Segmental and somatic dysfunction of pelvic region: Secondary | ICD-10-CM | POA: Diagnosis not present

## 2021-08-12 DIAGNOSIS — M9904 Segmental and somatic dysfunction of sacral region: Secondary | ICD-10-CM | POA: Diagnosis not present

## 2021-08-12 DIAGNOSIS — M9903 Segmental and somatic dysfunction of lumbar region: Secondary | ICD-10-CM | POA: Diagnosis not present

## 2021-08-12 DIAGNOSIS — M5137 Other intervertebral disc degeneration, lumbosacral region: Secondary | ICD-10-CM | POA: Diagnosis not present

## 2021-08-12 DIAGNOSIS — I10 Essential (primary) hypertension: Secondary | ICD-10-CM

## 2021-08-12 MED ORDER — AMLODIPINE BESYLATE 5 MG PO TABS: 5.0000 mg | ORAL_TABLET | Freq: Every day | ORAL | 3 refills | Status: DC

## 2021-08-12 NOTE — Telephone Encounter (Signed)
Returned a call to the patient .he states that his CPAP mask has been "leaking" the last 2 nights. He can feel the air in his eyes. I pulled a report from [place] and informed the patient that it does not show that he is having a mask leak. Some suggestions were made to the patient to get a better seal. I.e. washing his face before use, which he has not been doing. Patient states that he will try this to see if it helps. He then asked if Judson Roch, the nurse will send in Amlodipine 2.5 mg tablets for him to double instead of '5mg'$  tablets because he does not plan to continue with the 5 mg. ?He feels that his BP is currently elevated due to stress. Message will be sent to Judson Roch, RN. ?

## 2021-08-12 NOTE — Telephone Encounter (Signed)
Informed patient of the recommendation from Fabian Sharp, [place].Marland KitchenMarland Kitchen"Please increase amlodipine to 5 mg. Continue BP log 2 hrs after medications." Patient will take an extra 2.5 mg amlodipine now and start the 5 mg tablet tomorrow and continue to monitor his BP. New prescription sent to patient's pharmacy. ?
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
## 2021-08-12 NOTE — Telephone Encounter (Signed)
Pt c/o BP issue: STAT if pt c/o blurred vision, one-sided weakness or slurred speech ? ?1. What are your last 5 BP readings? 178/78: 189/107 ? ?2. Are you having any other symptoms (ex. Dizziness, headache, blurred vision, passed out)? lightheadedness ? ?3. What is your BP issue? High  ? ?

## 2021-08-12 NOTE — Telephone Encounter (Signed)
Questions about his cpap, would like a call back  ?

## 2021-08-13 NOTE — Telephone Encounter (Signed)
-  Pt called to provide updated blood pressure reading (listed below). ?-Nurse informed pt that it can take up to a week to see any changes in BP since recently increasing amlodipine to 5 mg yesterday and to continue to keep a log. ?-Pt verbalized understanding. ? ? ?Noon 187/93 yesterday ?171/82 afternoon ?161/88 9 another pill ?  ?This morning 158/86 ?830 132/80 ?157/76 15 minutes  ?

## 2021-08-13 NOTE — Telephone Encounter (Signed)
Pt c/o BP issue: STAT if pt c/o blurred vision, one-sided weakness or slurred speech ? ?1. What are your last 5 BP readings?  ? ?5/11:    187/93 around 12:00 PM ? 171/82 later in the afternoon ? 161/88 around 9:00 PM, after meds ? ?5/12:    158/86 early? this morning ? 132/80 around 8:30 AM ? 157/76 about 15 minutes ago (11:45 AM) ? ?2. Are you having any other symptoms (ex. Dizziness, headache, blurred vision, passed out)?  ?No symptoms  ? ?3. What is your BP issue?  ?Patient is following up to report BP readings ? ? ?Nonn 187/93 yesterda ?171/82 afternoon ?161/88 9 another pill ? ?This morning 158/86 ?830 132/80 ?157/76 15 minutes  ?

## 2021-08-16 DIAGNOSIS — M9905 Segmental and somatic dysfunction of pelvic region: Secondary | ICD-10-CM | POA: Diagnosis not present

## 2021-08-16 DIAGNOSIS — M9904 Segmental and somatic dysfunction of sacral region: Secondary | ICD-10-CM | POA: Diagnosis not present

## 2021-08-16 DIAGNOSIS — M5137 Other intervertebral disc degeneration, lumbosacral region: Secondary | ICD-10-CM | POA: Diagnosis not present

## 2021-08-16 DIAGNOSIS — M9903 Segmental and somatic dysfunction of lumbar region: Secondary | ICD-10-CM | POA: Diagnosis not present

## 2021-08-18 ENCOUNTER — Encounter: Payer: Self-pay | Admitting: Pulmonary Disease

## 2021-08-18 ENCOUNTER — Ambulatory Visit: Payer: Medicare Other | Admitting: Pulmonary Disease

## 2021-08-18 ENCOUNTER — Ambulatory Visit (INDEPENDENT_AMBULATORY_CARE_PROVIDER_SITE_OTHER): Payer: Medicare Other

## 2021-08-18 VITALS — BP 126/68 | HR 74 | Temp 98.2°F | Ht 70.0 in | Wt 187.8 lb

## 2021-08-18 DIAGNOSIS — R0609 Other forms of dyspnea: Secondary | ICD-10-CM

## 2021-08-18 DIAGNOSIS — M9905 Segmental and somatic dysfunction of pelvic region: Secondary | ICD-10-CM | POA: Diagnosis not present

## 2021-08-18 DIAGNOSIS — M5137 Other intervertebral disc degeneration, lumbosacral region: Secondary | ICD-10-CM | POA: Diagnosis not present

## 2021-08-18 DIAGNOSIS — M9903 Segmental and somatic dysfunction of lumbar region: Secondary | ICD-10-CM | POA: Diagnosis not present

## 2021-08-18 DIAGNOSIS — M9904 Segmental and somatic dysfunction of sacral region: Secondary | ICD-10-CM | POA: Diagnosis not present

## 2021-08-18 DIAGNOSIS — R0600 Dyspnea, unspecified: Secondary | ICD-10-CM | POA: Diagnosis not present

## 2021-08-18 NOTE — Progress Notes (Signed)
? ?'@Patient'$  ID: Patrick Bell, male    DOB: 03-09-1941, 81 y.o.   MRN: [id number] ? ?Chief Complaint  ?Patient presents with  ? Consult  ?  Pt is here due to a mitral valve repair than lead to an aorta repair. Pt is needing to be seen due to his constant dizzy. Pt states he feels worn out a lot. He states that it started 3 months ago. He does feel SOB due to asthma.   ? ? ?Referring provider: ?Dettinger, Fransisca Kaufmann, MD ? ?HPI:  ? ?81 y.o. man whom we are seeing in consultation for evaluation of dyspnea on exertion.  Note from referring provider reviewed. ? ?Chief complaint is really his lightheadedness, dizziness.  Sometimes room spinning or vertigo.  Most times just lightheaded, not that he is going to pass out.  But does feel lightheaded.  Rarely feels sensation of presyncope.  Occurs at rest and with exertion but most often with exertion. ? ?He endorses worsening dyspnea on exertion over the last month.  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  Overall he is much less active than prior.  After his prolonged hospitalization he completed due to mitral valve repair and aortic dissection repair he got back to walking.  Over an hour it sounds like.  On the trails.  Now hardly walking at all.  No environmental factors he can identify that make things better or worse.  No times a day with things are better or worse.  No position make things better or worse.  No real alleviating or exacerbating factors.  Started a month ago.  Out of the blue.  No clear trigger. ? ?Endorses a history of asthma well-controlled.  When asked he does have albuterol inhaler.  It helps mildly with symptoms described above.  Not on maintenance inhaler. ? ?PMH: Hypertension, mitral valve repair, sleep apnea, asthma ?Surgical history: mitral valve repair, aortic dissection repair ?Family history: Reviewed, no pertinent family history ?Social history: Former smoker, 100+ pack year.  Quit many years ago.  Lives in [place] ? ?Questionaires  / Pulmonary Flowsheets:  ? ?ACT:  ?   ? View : No data to display.  ?  ?  ?  ? ? ?MMRC: ?   ? View : No data to display.  ?  ?  ?  ? ? ?[place]:  ?   ? View : No data to display.  ?  ?  ?  ? ? ?Tests:  ? ?FENO:  ?No results found for: NITRICOXIDE ? ?PFT: ?   ? View : No data to display.  ?  ?  ?  ? ? ?WALK:  ?   ? View : No data to display.  ?  ?  ?  ? ? ?Imaging: ?Personally reviewed and as per EMR discussion this note ?No results found. ? ?Lab Results: ?Personally reviewed ?CBC ?   ?Component Value Date/Time  ? WBC 5.0 07/08/2021 1104  ? WBC 8.3 12/02/2020 1851  ? RBC 5.18 07/08/2021 1104  ? RBC 5.34 12/02/2020 1851  ? HGB 14.0 07/08/2021 1104  ? HCT 43.6 07/08/2021 1104  ? PLT 176 07/08/2021 1104  ? MCV 84 07/08/2021 1104  ? MCH 27.0 07/08/2021 1104  ? MCH 27.7 12/02/2020 1851  ? MCHC 32.1 07/08/2021 1104  ? MCHC 31.7 12/02/2020 1851  ? RDW 14.9 07/08/2021 1104  ? LYMPHSABS 0.9 07/08/2021 1104  ? MONOABS 0.6 12/02/2020 1851  ? EOSABS 0.1 07/08/2021 1104  ?  BASOSABS 0.1 07/08/2021 1104  ? ? ?BMET ?   ?Component Value Date/Time  ? NA 144 07/08/2021 1104  ? K 4.3 07/08/2021 1104  ? CL 105 07/08/2021 1104  ? CO2 25 07/08/2021 1104  ? GLUCOSE 84 07/08/2021 1104  ? GLUCOSE 105 (H) 12/02/2020 1851  ? BUN 16 07/08/2021 1104  ? CREATININE 1.12 07/08/2021 1104  ? CREATININE 0.91 06/18/2012 1031  ? CALCIUM 9.0 07/08/2021 1104  ? GFRNONAA >60 12/02/2020 1851  ? GFRNONAA 84 06/18/2012 1031  ? GFRAA 45 (L) 04/27/2020 1705  ? GFRAA >89 06/18/2012 1031  ? ? ?BNP ?   ?Component Value Date/Time  ? BNP 112.7 (H) 09/10/2020 1154  ? ? ?ProBNP ?No results found for: PROBNP ? ?Specialty Problems   ? ?  ? Pulmonary Problems  ? Asthma  ?  Qualifier: Diagnosis of  ?By: Burnett Kanaris  ? ? ?  ?  ? OSA (obstructive sleep apnea)  ?  Qualifier: Diagnosis of  ?By: Burnett Kanaris  ? ? ?  ?  ? ? ?No Known Allergies ? ?Immunization History  ?Administered Date(s) Administered  ? Fluad Quad(high Dose 65+) 02/13/2019, 01/06/2020, 02/05/2021  ?  Influenza, High Dose Seasonal PF 01/23/2018  ? Influenza,inj,Quad PF,6+ Mos 01/14/2013, 03/14/2014, 03/11/2015, 01/19/2016, 01/18/2017  ? Moderna Sars-Covid-2 Vaccination 05/10/2019, 06/07/2019  ? Pneumococcal Conjugate-13 03/14/2014  ? Pneumococcal Polysaccharide-23 01/14/2013  ? Tdap 07/06/2020  ? Zoster Recombinat (Shingrix) 07/08/2021  ? Zoster, Live 06/14/2011  ? ? ?Past Medical History:  ?Diagnosis Date  ? Acute thoracic aortic dissection ([place]) 03/11/2020  ? Type A  ? Allergy   ? Asthma   ? Cataract   ? Cataract   ? Diverticulitis   ? Dyspnea   ? GERD (gastroesophageal reflux disease)   ? Heart murmur 12/2019  ? Hyperlipidemia   ? Hypertension   ? Inguinal hernia bilateral, non-recurrent   ? S/P aortic dissection repair 03/11/2020  ? supracoronary straight graft repair with resuspension of native aortic valve and open distal anastomosis for intraoperative acute type A aortic dissection   ? S/P mitral valve repair 03/11/2020  ? Complex valvuloplasty including artificial Gore-tex neochord placement x 4, suture plication of posterior leaflet and posteromedial commissure and 32 mm Sorin Memo 4D ring annuloplasty  ? Sleep apnea   ? ? ?Tobacco History: ?Social History  ? ?Tobacco Use  ?Smoking Status Former  ? Packs/day: 4.00  ? Years: 25.00  ? Pack years: 100.00  ? Types: Cigarettes  ? Quit date: 06/23/1982  ? Years since quitting: 39.1  ?Smokeless Tobacco Never  ?Tobacco Comments  ? pt was a truck driver  ? ?Counseling given: Not Answered ?Tobacco comments: pt was a truck driver ? ? ?Continue to not smoke ? ?Outpatient Encounter Medications as of 08/18/2021  ?Medication Sig  ? acetaminophen (TYLENOL) 325 MG tablet Take 1-2 tablets (325-650 mg total) by mouth every 4 (four) hours as needed for mild pain.  ? albuterol (PROAIR HFA) 108 (90 Base) MCG/ACT inhaler Inhale 2 puffs into the lungs every 6 (six) hours as needed for wheezing.  ? amLODipine (NORVASC) 5 MG tablet Take 1 tablet (5 mg total) by mouth daily.  ? Elastic  Bandages & Supports (ABDOMINAL BINDER/ELASTIC LARGE) MISC 1 each by Does not apply route daily.  ? guaiFENesin (MUCINEX) 600 MG 12 hr tablet Take 600 mg by mouth every 4 (four) hours as needed for cough or to loosen phlegm.  ? levothyroxine (SYNTHROID) 50 MCG tablet Take 1 tablet (50 mcg  total) by mouth daily.  ? metoprolol succinate (TOPROL-XL) 50 MG 24 hr tablet Take 1 tablet by mouth at night  ? montelukast (SINGULAIR) 10 MG tablet TAKE 1 TABLET DAILY  ? Olopatadine HCl 0.2 % SOLN Place 1 drop into both eyes daily as needed (allergies).  ? pantoprazole (PROTONIX) 40 MG tablet TAKE 1 TABLET DAILY  ? simvastatin (ZOCOR) 10 MG tablet TAKE ONE TABLET AT BEDTIME  ? warfarin (COUMADIN) 5 MG tablet TAKE ONE TABLET ONCE DAILY AT 4PM  ? ?No facility-administered encounter medications on file as of 08/18/2021.  ? ? ? ?Review of Systems ? ?Review of Systems  ?No chest pain with exertion.  No orthopnea or PND.  No lower extremity swelling.  Comprehensive review of systems otherwise negative. ?Physical Exam ? ?BP 126/68 (BP Location: Left Arm, Patient Position: Sitting, Cuff Size: Normal)   Pulse 74   Temp 98.2 ?F (36.8 ?C) (Oral)   Ht '5\' 10"'$  (1.778 m)   Wt 187 lb 12.8 oz (85.2 kg)   SpO2 97%   BMI 26.95 kg/m?  ? ?Wt Readings from Last 5 Encounters:  ?08/18/21 187 lb 12.8 oz (85.2 kg)  ?08/06/21 184 lb (83.5 kg)  ?07/27/21 186 lb 9.6 oz (84.6 kg)  ?07/08/21 183 lb (83 kg)  ?05/19/21 182 lb (82.6 kg)  ? ? ?BMI Readings from Last 5 Encounters:  ?08/18/21 26.95 kg/m?  ?08/06/21 26.40 kg/m?  ?07/27/21 26.77 kg/m?  ?07/08/21 27.02 kg/m?  ?05/19/21 26.88 kg/m?  ? ? ? ?Physical Exam ?General: Well-appearing, no acute distress ?Eyes: EOMI, icterus ?Neck: Supple, JVP ?Pulmonary: Clear, no work of breathing, good excursion ?Cardiovascular: Regular rhythm, warm ?Abdomen: Nondistended, bowel sounds present ?MSK: No synovitis, joint effusion ?Neuro: Normal gait, no weakness ?Psych: Normal mood, full affect ? ? ?Assessment & Plan:   ? ?Dyspnea on exertion: Really on the problem of the last month or so he thinks.  Much less active overall compared to postoperatively when he was walking up to an hour a day.  Suspect component of decondi

## 2021-08-18 NOTE — Patient Instructions (Signed)
Nice to meet you ? ?I ordered pulmonary function tests (PFTs) to attempt to get more information about your symptoms ? ?Return to clinic based on result of those tests  ?

## 2021-08-19 DIAGNOSIS — M9903 Segmental and somatic dysfunction of lumbar region: Secondary | ICD-10-CM | POA: Diagnosis not present

## 2021-08-19 DIAGNOSIS — M9905 Segmental and somatic dysfunction of pelvic region: Secondary | ICD-10-CM | POA: Diagnosis not present

## 2021-08-19 DIAGNOSIS — M5137 Other intervertebral disc degeneration, lumbosacral region: Secondary | ICD-10-CM | POA: Diagnosis not present

## 2021-08-19 DIAGNOSIS — M9904 Segmental and somatic dysfunction of sacral region: Secondary | ICD-10-CM | POA: Diagnosis not present

## 2021-08-20 DIAGNOSIS — G4733 Obstructive sleep apnea (adult) (pediatric): Secondary | ICD-10-CM | POA: Diagnosis not present

## 2021-08-20 DIAGNOSIS — R0683 Snoring: Secondary | ICD-10-CM | POA: Diagnosis not present

## 2021-08-20 NOTE — Progress Notes (Signed)
CXR clear

## 2021-08-23 ENCOUNTER — Ambulatory Visit (INDEPENDENT_AMBULATORY_CARE_PROVIDER_SITE_OTHER): Payer: Medicare Other

## 2021-08-23 DIAGNOSIS — I71019 Dissection of thoracic aorta, unspecified: Secondary | ICD-10-CM

## 2021-08-23 DIAGNOSIS — Z5181 Encounter for therapeutic drug level monitoring: Secondary | ICD-10-CM | POA: Diagnosis not present

## 2021-08-23 DIAGNOSIS — Z9889 Other specified postprocedural states: Secondary | ICD-10-CM | POA: Diagnosis not present

## 2021-08-23 DIAGNOSIS — M9904 Segmental and somatic dysfunction of sacral region: Secondary | ICD-10-CM | POA: Diagnosis not present

## 2021-08-23 DIAGNOSIS — M9905 Segmental and somatic dysfunction of pelvic region: Secondary | ICD-10-CM | POA: Diagnosis not present

## 2021-08-23 DIAGNOSIS — M9903 Segmental and somatic dysfunction of lumbar region: Secondary | ICD-10-CM | POA: Diagnosis not present

## 2021-08-23 DIAGNOSIS — M5137 Other intervertebral disc degeneration, lumbosacral region: Secondary | ICD-10-CM | POA: Diagnosis not present

## 2021-08-23 LAB — POCT INR: INR: 2.8 (ref 2.0–3.0)

## 2021-08-23 NOTE — Patient Instructions (Signed)
-   resume taking 1 tablet daily except 1/2 tablet each Sunday.   - Repeat INR in 8 weeks

## 2021-08-24 ENCOUNTER — Other Ambulatory Visit: Payer: Self-pay | Admitting: Family Medicine

## 2021-08-24 DIAGNOSIS — J4520 Mild intermittent asthma, uncomplicated: Secondary | ICD-10-CM

## 2021-08-26 DIAGNOSIS — M5137 Other intervertebral disc degeneration, lumbosacral region: Secondary | ICD-10-CM | POA: Diagnosis not present

## 2021-08-26 DIAGNOSIS — M9904 Segmental and somatic dysfunction of sacral region: Secondary | ICD-10-CM | POA: Diagnosis not present

## 2021-08-26 DIAGNOSIS — M9905 Segmental and somatic dysfunction of pelvic region: Secondary | ICD-10-CM | POA: Diagnosis not present

## 2021-08-26 DIAGNOSIS — M9903 Segmental and somatic dysfunction of lumbar region: Secondary | ICD-10-CM | POA: Diagnosis not present

## 2021-09-02 DIAGNOSIS — M9904 Segmental and somatic dysfunction of sacral region: Secondary | ICD-10-CM | POA: Diagnosis not present

## 2021-09-02 DIAGNOSIS — M9903 Segmental and somatic dysfunction of lumbar region: Secondary | ICD-10-CM | POA: Diagnosis not present

## 2021-09-02 DIAGNOSIS — M9905 Segmental and somatic dysfunction of pelvic region: Secondary | ICD-10-CM | POA: Diagnosis not present

## 2021-09-02 DIAGNOSIS — M5137 Other intervertebral disc degeneration, lumbosacral region: Secondary | ICD-10-CM | POA: Diagnosis not present

## 2021-09-06 DIAGNOSIS — M5137 Other intervertebral disc degeneration, lumbosacral region: Secondary | ICD-10-CM | POA: Diagnosis not present

## 2021-09-06 DIAGNOSIS — M9904 Segmental and somatic dysfunction of sacral region: Secondary | ICD-10-CM | POA: Diagnosis not present

## 2021-09-06 DIAGNOSIS — M9905 Segmental and somatic dysfunction of pelvic region: Secondary | ICD-10-CM | POA: Diagnosis not present

## 2021-09-06 DIAGNOSIS — M9903 Segmental and somatic dysfunction of lumbar region: Secondary | ICD-10-CM | POA: Diagnosis not present

## 2021-09-07 ENCOUNTER — Ambulatory Visit (INDEPENDENT_AMBULATORY_CARE_PROVIDER_SITE_OTHER): Payer: Medicare Other | Admitting: Pulmonary Disease

## 2021-09-07 ENCOUNTER — Encounter: Payer: Self-pay | Admitting: Cardiovascular Disease

## 2021-09-07 ENCOUNTER — Ambulatory Visit: Payer: Medicare Other | Admitting: Cardiovascular Disease

## 2021-09-07 DIAGNOSIS — G4733 Obstructive sleep apnea (adult) (pediatric): Secondary | ICD-10-CM

## 2021-09-07 DIAGNOSIS — R0609 Other forms of dyspnea: Secondary | ICD-10-CM | POA: Diagnosis not present

## 2021-09-07 DIAGNOSIS — I480 Paroxysmal atrial fibrillation: Secondary | ICD-10-CM

## 2021-09-07 DIAGNOSIS — Z7901 Long term (current) use of anticoagulants: Secondary | ICD-10-CM | POA: Diagnosis not present

## 2021-09-07 DIAGNOSIS — I10 Essential (primary) hypertension: Secondary | ICD-10-CM

## 2021-09-07 DIAGNOSIS — Z9889 Other specified postprocedural states: Secondary | ICD-10-CM

## 2021-09-07 DIAGNOSIS — E785 Hyperlipidemia, unspecified: Secondary | ICD-10-CM

## 2021-09-07 DIAGNOSIS — I71019 Dissection of thoracic aorta, unspecified: Secondary | ICD-10-CM

## 2021-09-07 LAB — PULMONARY FUNCTION TEST
DL/VA % pred: 84 %
DL/VA: 3.27 ml/min/mmHg/L
DLCO cor % pred: 89 %
DLCO cor: 21.7 ml/min/mmHg
DLCO unc % pred: 89 %
DLCO unc: 21.7 ml/min/mmHg
FEF 25-75 Post: 1.48 L/sec
FEF 25-75 Pre: 1.14 L/sec
FEF2575-%Change-Post: 29 %
FEF2575-%Pred-Post: 78 %
FEF2575-%Pred-Pre: 60 %
FEV1-%Change-Post: 6 %
FEV1-%Pred-Post: 84 %
FEV1-%Pred-Pre: 79 %
FEV1-Post: 2.36 L
FEV1-Pre: 2.21 L
FEV1FVC-%Change-Post: -1 %
FEV1FVC-%Pred-Pre: 88 %
FEV6-%Change-Post: 7 %
FEV6-%Pred-Post: 101 %
FEV6-%Pred-Pre: 93 %
FEV6-Post: 3.71 L
FEV6-Pre: 3.44 L
FEV6FVC-%Change-Post: 0 %
FEV6FVC-%Pred-Post: 104 %
FEV6FVC-%Pred-Pre: 105 %
FVC-%Change-Post: 8 %
FVC-%Pred-Post: 96 %
FVC-%Pred-Pre: 88 %
FVC-Post: 3.8 L
FVC-Pre: 3.5 L
Post FEV1/FVC ratio: 62 %
Post FEV6/FVC ratio: 98 %
Pre FEV1/FVC ratio: 63 %
Pre FEV6/FVC Ratio: 98 %
RV % pred: 116 %
RV: 3.13 L
TLC % pred: 96 %
TLC: 6.83 L

## 2021-09-07 NOTE — Patient Instructions (Signed)
Medication Instructions:  The current medical regimen is effective;  continue present plan and medications.  *If you need a refill on your cardiac medications before your next appointment, please call your pharmacy*   Follow-Up: At [facility], you and your health needs are our priority.  As part of our continuing mission to provide you with exceptional heart care, we have created designated Provider Care Teams.  These Care Teams include your primary Cardiologist (physician) and Advanced Practice Providers (APPs -  Physician Assistants and Nurse Practitioners) who all work together to provide you with the care you need, when you need it.  We recommend signing up for the patient portal called "MyChart".  Sign up information is provided on this After Visit Summary.  MyChart is used to connect with patients for Virtual Visits (Telemedicine).  Patients are able to view lab/test results, encounter notes, upcoming appointments, etc.  Non-urgent messages can be sent to your provider as well.   To learn more about what you can do with MyChart, go to [email or web address].    Your next appointment:   3 month(s)  The format for your next appointment:   In Person  Provider:   Doreene Adas PA-C then you will see Dr.Kelly back in 6 months.  {

## 2021-09-07 NOTE — Progress Notes (Signed)
Cardiology Office Note    Date:  09/20/2021   ID:  Patrick Bell, DOB March 11, 1941, MRN [id number]  PCP:  Dettinger, Elige Radon, MD  Cardiologist:  Nicki Guadalajara, MD   11 month F/U evaluation   History of Present Illness:  Patrick Bell is a 81 y.o. male who has a history of hypertension, hyperlipidemia, asthma as well as a history of obstructive sleep apnea initially diagnosed in 2016 .  At that time his AHI was 29.5, supine AHI, however was 56.9 and O2 desaturation was 82%.  He initially used CPAP but essentially stopped using therapy for over 3 years.  He has a history of hypertension and remotely was on amlodipine but also has been off treatment for at least a year.  When I saw him for initial evaluation he had recently started to notice blood pressure elevation as well as mild shortness of breath.  He was recently evaluated by Dr. Louanne Skye and referred for an echo Doppler study on November 25, 2019 which revealed normal LV function with an EF of 60 to 65% with indeterminant diastolic parameters.  RV systolic function was normal.  There was mild LA dilation.  He was felt to have severe mitral regurgitation with moderate holosystolic prolapse of the middle scallop of the posterior leaflet of the mitral valve.  There was moderate mitral annular calcification in the severe MR had eccentric anteriorly directed jet.  There was no evidence for mitral stenosis.   He was referred for cardiology evaluation.    When I initially saw him he denied any awareness of rhythm abnormality.  He was sleeping poorly and was unable to sleep on his back.  An Epworth Sleepiness Scale score calculated in the office was significantly increased at 18 consistent with excessive daytime sleepiness.  At my initial evaluation, I recommended that he undergo a transesophageal echocardiogram to assess his severe mitral regurgitation and possible potential flail leaflet.  I also recommended he undergo a sleep study.   The patient  subsequently underwent TEE which confirmed the presence of mitral valve prolapse with flail segment involving a portion of the posterior leaflet and severe mitral regurgitation.   He was subsequently seen by Azalee Course, PA and was referred to Dr. Tressie Stalker for surgical consultation.  He saw Dr. Cornelius Moras on January 09, 2020 who felt he was a good candidate for mitral valve repair.  He subsequently has undergone CT imaging studies and was referred for right and left heart cardiac catheterization prior to planned surgery in early December.   He underwent his diagnostic polysomnogram on January 30, 2020 but did not meet split-night criteria and so his initial study was only diagnostic polysomnogram.  This reconfirmed moderate overall sleep apnea with an AHI of 23.1/h; however, sleep apnea was very severe in the supine position with an AHI of 52.2/h.   He subsequently underwent cardiac catheterization by me on February 21, 2020 prior to his planned valve surgery with Dr. Cornelius Moras.  He had normal right heart pressures with a PA mean at 20 mm.  Coronary arteries were normal.  LVEDP was 16 mm.  He underwent elective mitral valve repair with complex valvuloplasty by Dr. Cornelius Moras on March 11, 2020.  The procedure was complicated by an acute type A aortic dissection requiring graft repair of his aorta and resuspension of his native aortic valve.  His hospital course was further complicated by pericardial effusion requiring pericardial window, atrial fibrillation treated with amiodarone and Coumadin, anemia requiring transfusion,  AKA, hypotension as well as a new diagnosis of hypothyroidism.  After his pericardial window he converted back to sinus rhythm.  He was discharged to inpatient rehab on March 31, 2020 and was ultimately discharged from the rehab unit on April 08, 2019.  He was seen in follow-up by CT surgery on April 13, 2020 and at that time had complaints of unintentional weight loss with decreased appetite  fatigability and weakness.  He was eating smaller meals.  He was having difficulty with significant fatigability and daytime sleepiness.  There was concern that his hypothyroidism may be playing a role and he was encouraged to follow-up with his PCP.  He was evaluated by [name], PA in a telemedicine visit on April 21, 2020.  At that time, his blood pressure was controlled.  Creatinine was stable at 1.52 as was his anemia with a hemoglobin of 8.8.  I saw him for his initial post operative evaluation with me on May 11, 2020.  At that time he denied any chest pain or awareness of palpitations.  I reviewed his echo Doppler study from May 07, 2020 which showed an EF of 60 to 65%.  There was normal pulmonary artery systolic pressure.  There was moderate left atrial dilatation and mild dilation of his right atrium.  There was a 32 mm prosthetic annuloplasty ring in the mitral position with trivial MR.  The mean mitral gradient was 4.  Aortic sclerosis without stenosis.  He was significantly fatigued.  He was experiencing frequent nocturia up to 4-5 times per night and often was taking 2 naps per day.  He denies any chest pain or arrhythmia.  He is not sleeping well.  He is to start cardiac rehab.  He is always tired.  He has nocturia at least 4-5 times per night and was  taking 2 naps per day.  During that evaluation, reviewed his sleep study was done on January 30, 2020 due to his cardiac surgery he never underwent follow-up titration.  He had moderate overall sleep apnea with an AHI of 23.1 and O2 nadir at 83%.  At that time, since he had a CPAP ResMed air sense 10 AutoSet unit with an original set up date in February 2016 I recommended AutoPap at 7 to 20 cm.  He  was seen by Dr. Roxy Manns on May 25, 2020 and was felt to be doing well 3 months status post mitral valve repair complicated by his acute type a aortic dissection requiring surgical repair.  He subsequently underwent CT angiography on June 12, 2020 of his chronic dissection involving the remaining transverse and descending thoracic aorta as well as abdominal aorta.  CT imaging revealed persistent aortic dissection flap through the arch, descending thoracic, abdominal aorta, bilateral iliac arteries, and left common femoral artery.  The dissection flap involves bilateral subclavian arteries, celiac, SMA, and bilateral renal arteries.  I saw him for reevaluation on July 14, 2020.  Since his prior evaluation he had  initiated CPAP therapy.  He has felt improved since initiating treatment.  A download was obtained from February 10 through June 12, 2020 which shows usage at only 50% of days with usage at 3 hours and 22 minutes.  His CPAP auto range was set initially at 7 to 20 cm and his 95% average was 14.8 with a maximum average of 16.4.  Presently, he is sleeping better.  He continues to be fatigued.  He had undergone repeat laboratory and renal function has improved with  a creatinine of 1.64 on April 27, 2020 to July 06, 2020 creatinine at 1.19.  He was unaware of any arrhythmias.  During his evaluation, I reviewed his recent CT with him in detail as noted above.  His blood pressure was elevated and I added amlodipine 5 mg to his medical regimen for more optimal blood pressure control and with his chronic dissection recommended titration of metoprolol succinate 25 mg up to 50 mg daily.  He was on warfarin and he recently had self regulated his dose and close INR follow-up was recommended.  He was referred to the emergency room for apparent bilateral blood pressure differential on September 24, 2020.  A follow-up CT of abdomen and pelvis was performed which again confirmed grossly stable thoracic aortic dissection that includes the transverse arch, descending thoracic aorta and extends through the abdominal aorta into the bilateral common and external iliac arteries and left common femoral artery.  Dissection flap was also seen involving the left  subclavian artery superior mesenteric and both renal arteries.  He was evaluated in a telemedicine phone evaluation with Dr. Roxy Manns on September 24, 2020 so reviewed CT findings.  He is aware that Dr. Roxy Manns is leaving the [place] community this month.  It was advised that he have 97-monthfollow-up CT imaging.  I last saw him in July 2022.  Since his prior evaluation he had essentially stopped using CPAP therapy and also self discontinued his levothyroxine.  O over the prior week he also  self discontinued his metoprolol succinate.  He has been on [facility] and is completing a doxycycline course.  He was concerned about his need to continue warfarin.  He also continues to experience fatigue but has not been using CPAP but believes he is sleeping okay.  During that evaluation, I again discussed potential adverse cardiovascular consequences associated with not using his CPAP and in particular the increased risk for recurrent atrial fibrillation.  With his extensive aortic dissection I stressed with him the importance of optimally treating his blood pressure and recommended he resume metoprolol succinate 50 mg daily with target blood pressure ideally less than 120/80.  I also resume levothyroxine 50 mcg daily.  Since I last saw him, he was evaluated by Dr. BCyndia Benton May 19, 2021 his most recent echo had shown an EF of 60 to 65% with mild MR and AR.  The CTA of his chest/abdomen/pelvis in March 05, 2021 showed stable ascending aortic repair and the residual dissection flap was visible just beyond the repair extending through the aortic arch and down into the abdominal aorta into the bilateral iliac arteries and left common femoral artery.  The dissection flap extended into the SMA and bilateral renal arteries.  Optimal blood pressure was again stressed.  He had undergone a repeat sleep study on January 26, 2021 which again showed moderate obstructive sleep apnea with AHI 25.3 but  sleep apnea was severe with supine sleep with an AHI of 35.3 and during REM sleep with an AHI of 42.7.  CPAP was initiated at 6 cm and titrated to 18 cm of water.  He received a new ResMed AirSense 11 AutoSet unit on July 21, 2021.  A download from April 25 through Aug 25, 2021 showed that he was just meeting compliance standards at 70% use and usage greater than 4 hours.  However daily use still remains suboptimal at only 4 hours and 47 minutes.  At a pressure range of 16 to 20  cm, AHI is 4.8-hour.  Presently, he denies chest pain.  His blood pressure has improved and he is now back on amlodipine 5 mg, metoprolol succinate 50 mg daily.  He is on simvastatin 10 mg at bedtime and is anticoagulated on warfarin.  He is on levothyroxine for hypothyroidism.  He presents for reevaluation.  Past Medical History:  Diagnosis Date   Acute thoracic aortic dissection (HCC) 03/11/2020   Type A   Allergy    Asthma    Cataract    Cataract    Diverticulitis    Dyspnea    GERD (gastroesophageal reflux disease)    Heart murmur 12/2019   Hyperlipidemia    Hypertension    Inguinal hernia bilateral, non-recurrent    S/P aortic dissection repair 03/11/2020   supracoronary straight graft repair with resuspension of native aortic valve and open distal anastomosis for intraoperative acute type A aortic dissection    S/P mitral valve repair 03/11/2020   Complex valvuloplasty including artificial Gore-tex neochord placement x 4, suture plication of posterior leaflet and posteromedial commissure and 32 mm Sorin Memo 4D ring annuloplasty   Sleep apnea     Past Surgical History:  Procedure Laterality Date   bilateral inguinal hernia     CARDIAC CATHETERIZATION Bilateral 02/21/2020   CHEST TUBE INSERTION N/A 03/22/2020   Procedure: CHEST TUBE INSERTION OF 28 BLAKE DRAIN.;  Surgeon: Grace Isaac, MD;  Location: [place] [facility];  Service: Thoracic;  Laterality: N/A;   COLONOSCOPY  06/09/2004   WNU:[id number] colon  diverticulosis, otherwise normal colonoscopy   EYE SURGERY  08/2014   HERNIA REPAIR     MITRAL VALVE REPAIR N/A 03/11/2020   Procedure: MITRAL VALVE REPAIR (MVR) USING MEMO 4D SIZE 32 RING;  Surgeon: Rexene Alberts, MD;  Location: [place];  Service: Open Heart Surgery;  Laterality: N/A;   PALATE / UVULA BIOPSY / EXCISION     PERICARDIAL WINDOW N/A 03/22/2020   Procedure: SUBXYPHOID POST-OP PERICARDIAL FLUID DRAINAGE WITH CHEST TUBE INSERTION.;  Surgeon: Grace Isaac, MD;  Location: [place];  Service: Thoracic;  Laterality: N/A;   REPAIR OF ACUTE ASCENDING THORACIC AORTIC DISSECTION N/A 03/11/2020   Procedure: REPAIR OF ACUTE ASCENDING THORACIC AORTIC DISSECTION USING A HEMASHIELD PLATINUM 26MM STRAIGHT GRAFT AND A HEASHIELD PLATINUM 28X10MM SINGLE ARM GRAFT;  Surgeon: Rexene Alberts, MD;  Location: [place];  Service: Open Heart Surgery;  Laterality: N/A;   RIGHT/LEFT HEART CATH AND CORONARY ANGIOGRAPHY N/A 02/21/2020   Procedure: RIGHT/LEFT HEART CATH AND CORONARY ANGIOGRAPHY;  Surgeon: Troy Sine, MD;  Location: [place] [facility];  Service: Cardiovascular;  Laterality: N/A;   TEE WITHOUT CARDIOVERSION N/A 12/19/2019   Procedure: TRANSESOPHAGEAL ECHOCARDIOGRAM (TEE);  Surgeon: Buford Dresser, MD;  Location: [facility];  Service: Cardiovascular;  Laterality: N/A;   TEE WITHOUT CARDIOVERSION N/A 03/11/2020   Procedure: TRANSESOPHAGEAL ECHOCARDIOGRAM (TEE);  Surgeon: Rexene Alberts, MD;  Location: [place];  Service: Open Heart Surgery;  Laterality: N/A;   TEE WITHOUT CARDIOVERSION N/A 03/22/2020   Procedure: TRANSESOPHAGEAL ECHOCARDIOGRAM (TEE);  Surgeon: Grace Isaac, MD;  Location: [facility] OR;  Service: Thoracic;  Laterality: N/A;   TONSILLECTOMY     VASECTOMY      Current Medications: Outpatient Medications Prior to Visit  Medication Sig Dispense Refill   acetaminophen (TYLENOL) 325 MG tablet Take 1-2 tablets (325-650 mg total) by mouth every 4 (four) hours as needed for  mild pain.     albuterol (VENTOLIN HFA) 108 (90 Base) MCG/ACT  inhaler INHALE 2 PUFFS EVERY 6 HOURS AS NEEDED FOR WHEEZING OR SHORTNESS OF BREATH 8.5 g 2   amLODipine (NORVASC) 5 MG tablet Take 1 tablet (5 mg total) by mouth daily. 90 tablet 3   Elastic Bandages & Supports (ABDOMINAL BINDER/ELASTIC LARGE) MISC 1 each by Does not apply route daily. 1 each 0   guaiFENesin (MUCINEX) 600 MG 12 hr tablet Take 600 mg by mouth every 4 (four) hours as needed for cough or to loosen phlegm.     levothyroxine (SYNTHROID) 50 MCG tablet Take 1 tablet (50 mcg total) by mouth daily. 90 tablet 3   metoprolol succinate (TOPROL-XL) 50 MG 24 hr tablet Take 1 tablet by mouth at night 90 tablet 3   montelukast (SINGULAIR) 10 MG tablet TAKE 1 TABLET DAILY 90 tablet 3   Olopatadine HCl 0.2 % SOLN Place 1 drop into both eyes daily as needed (allergies).     pantoprazole (PROTONIX) 40 MG tablet TAKE 1 TABLET DAILY 90 tablet 3   simvastatin (ZOCOR) 10 MG tablet TAKE ONE TABLET AT BEDTIME 90 tablet 3   warfarin (COUMADIN) 5 MG tablet TAKE ONE TABLET ONCE DAILY AT 4PM 90 tablet 3   No facility-administered medications prior to visit.     Allergies:   Patient has no known allergies.   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  Tobacco Use   Smoking status: Former    Packs/day: 4.00    Years: 25.00    Total pack years: 100.00    Types: Cigarettes    Quit date: 06/23/1982    Years since quitting: 39.2   Smokeless tobacco: Never   Tobacco comments:    pt was a truck Land Use: Never used  Substance and Sexual Activity   Alcohol use: Yes    Alcohol/week: 5.0 standard drinks of alcohol    Types: 5 Shots of liquor per week    Comment: brandy a  few times a month, beer once a month   Drug use: No   Sexual activity: Yes    Birth control/protection: Surgical  Other Topics Concern   Not on file  Social History Narrative   Not on file   Social Determinants of Health   Financial Resource Strain: Low Risk  (01/23/2018)   Overall Financial Resource Strain (CARDIA)    Difficulty of Paying Living Expenses: Not hard at all  Food Insecurity: No Food Insecurity (01/23/2018)   Hunger Vital Sign    Worried About Running Out of Food in the Last Year: Never true    Ran Out of Food in the Last Year: Never true  Transportation Needs: No Transportation Needs (01/23/2018)   PRAPARE - Hydrologist (Medical): No    Lack of Transportation (Non-Medical): No  Physical Activity: Insufficiently Active (10/16/2020)   Exercise Vital Sign    Days of Exercise per Week: 4 days    Minutes of Exercise per Session: 30 min  Stress: No Stress Concern Present (10/16/2020)   [place]    Feeling of Stress : Not at all  Social Connections: [place] (01/23/2018)   Social  Connection and Isolation Panel [NHANES]    Frequency of Communication with Friends and Family: More than three times a week    Frequency of Social Gatherings with Friends and Family: More than three times a week    Attends Religious Services: More than 4 times per year    Active Member of [employer] or Organizations: Yes    Attends Music therapist: More than 4 times per year    Marital Status: Married     Family History:  The patient's family history includes Arthritis in his brother, father, and sister; Dementia (age of onset: 50) in his mother; Heart disease in his father and mother; Hip fracture (age of onset: 51) in his mother; Osteoporosis in his mother.   ROS General: Negative; No fevers, chills, or night sweats;  HEENT: Negative; No changes in vision or hearing, sinus  congestion, difficulty swallowing Pulmonary: Negative; No cough, wheezing, shortness of breath, hemoptysis Cardiovascular: see HPI GI: Negative; No nausea, vomiting, diarrhea, or abdominal pain GU: Negative; No dysuria, hematuria, or difficulty voiding Musculoskeletal: Negative; no myalgias, joint pain, or weakness Hematologic/Oncology: Negative; no easy bruising, bleeding Endocrine: Negative; no heat/cold intolerance; no diabetes Neuro: Negative; no changes in balance, headaches Skin: Negative; No rashes or skin lesions Psychiatric: Negative; No behavioral problems, depression Sleep: Positive for OSA with history of very severe sleep apnea with supine position with prior complaints of snoring, nocturia, daytime sleepiness and marked fatigability; nobruxism, restless legs, hypnogognic hallucinations, no cataplexy Other comprehensive 14 point system review is negative.   PHYSICAL EXAM:   VS:  BP (!) 100/54   Pulse 81   Ht $R'5\' 10"'Mp$  (1.778 m)   Wt 188 lb 3.2 oz (85.4 kg)   SpO2 98%   BMI 27.00 kg/m     Repeat blood pressure by me was 110/56.  Wt Readings from Last 3 Encounters:  09/07/21 188 lb 3.2 oz (85.4 kg)  08/18/21 187 lb 12.8 oz (85.2 kg)  08/06/21 184 lb (83.5 kg)    General: Alert, oriented, no distress.  Skin: normal turgor, no rashes, warm and dry HEENT: Normocephalic, atraumatic. Pupils equal round and reactive to light; sclera anicteric; extraocular muscles intact;  Nose without nasal septal hypertrophy Mouth/Parynx benign; Mallinpatti scale 3 Neck: No JVD, no carotid bruits; normal carotid upstroke Lungs: clear to ausculatation and percussion; no wheezing or rales Chest wall: without tenderness to palpitation Heart: PMI not displaced, RRR, s1 s2 normal, 1/6 systolic murmur, no diastolic murmur, no rubs, gallops, thrills, or heaves Abdomen: soft, nontender; no hepatosplenomehaly, BS+; abdominal aorta nontender and not dilated by palpation. Back: no CVA  tenderness Pulses 2+ Musculoskeletal: full range of motion, normal strength, no joint deformities Extremities: no clubbing cyanosis or edema, Homan's sign negative  Neurologic: grossly nonfocal; Cranial nerves grossly wnl Psychologic: Normal mood and affect  Studies/Labs Reviewed:   September 07, 2021 ECG (independently read by me): Sinus rhythm at 71, 1st degree AV block, inferior Q waves  October 06, 2020 ECG (independently read by me): Sinus rhythm at 91 bpm; first-degree AV block with a period of 1 to 30 ms.  Nonspecific ST-T wave abnormality.  Mildly prolonged QTc interval at 460 ms.   ECG (independently read by me):  NSR at 71; no significant STT changes; QTc 478 msec  Recent Labs:    Latest Ref Rng & Units 07/08/2021   11:04 AM 05/06/2021    8:17 AM 01/07/2021   10:23 AM  BMP  Glucose 70 - 99 mg/dL 84  87  85   BUN 8 - 27 mg/dL $Remove'16  14  12   'BmxRPcD$ Creatinine 0.76 - 1.27 mg/dL 1.12  1.12  1.05   BUN/Creat Ratio 10 - $Re'24 14  13  11   'gRa$ Sodium 134 - 144 mmol/L 144  141  142   Potassium 3.5 - 5.2 mmol/L 4.3  4.2  4.3   Chloride 96 - 106 mmol/L 105  105  104   CO2 20 - 29 mmol/L $RemoveB'25  23  24   'ybyKYcwg$ Calcium 8.6 - 10.2 mg/dL 9.0  9.2  9.1         Latest Ref Rng & Units 07/08/2021   11:04 AM 05/06/2021    8:17 AM 01/07/2021   10:23 AM  Hepatic Function  Total Protein 6.0 - 8.5 g/dL 5.8  6.1  6.3   Albumin 3.6 - 4.6 g/dL 4.3  4.2  4.2   AST 0 - 40 IU/L $Remov'19  17  15   'RJkcBF$ ALT 0 - 44 IU/L $Remov'10  7  7   'AVfQSx$ Alk Phosphatase 44 - 121 IU/L 63  69  68   Total Bilirubin 0.0 - 1.2 mg/dL 0.6  0.6  0.4        Latest Ref Rng & Units 07/08/2021   11:04 AM 05/06/2021    8:17 AM 01/07/2021   10:23 AM  CBC  WBC 3.4 - 10.8 x10E3/uL 5.0  4.7  4.7   Hemoglobin 13.0 - 17.7 g/dL 14.0  14.6  14.1   Hematocrit 37.5 - 51.0 % 43.6  45.3  43.1   Platelets 150 - 450 x10E3/uL 176  212  225    Lab Results  Component Value Date   MCV 84 07/08/2021   MCV 84 05/06/2021   MCV 84 01/07/2021   Lab Results  Component Value Date   TSH 0.880  07/08/2021   Lab Results  Component Value Date   HGBA1C 5.8 (H) 05/06/2021     BNP    Component Value Date/Time   BNP 112.7 (H) 09/10/2020 1154    ProBNP No results found for: "PROBNP"   Lipid Panel     Component Value Date/Time   CHOL 177 07/08/2021 1104   CHOL 168 06/18/2012 1031   TRIG 65 07/08/2021 1104   TRIG 101 01/14/2013 1218   TRIG 102 06/18/2012 1031   HDL 73 07/08/2021 1104   HDL 65 01/14/2013 1218   HDL 62 06/18/2012 1031   CHOLHDL 2.4 07/08/2021 1104   LDLCALC 92 07/08/2021 1104   LDLCALC 99 01/14/2013 1218   LDLCALC 86 06/18/2012 1031   LABVLDL 12 07/08/2021 1104    INR today, subtherapeutic at 1.3.   RADIOLOGY: No results found.   Additional studies/ records that were reviewed today include:   R/L Heart Cath: 02/21/2020  Normal right heart pressures with PA mean pressure at 20 mmHg.   Normal coronary arteries.   LVEDP: 62mmHg   RECOMMENDATION: Mr. Hy Swiatek is an 81 year old gentleman who was recently found to have a cardiac murmur.  He has subsequently been found to have severe mitral regurgitation in the setting of normal LV function with an EF of 60 to 65%.  He is felt to have severe prolapse involving the portion of the posterior leaflet of the mitral valve with at least one ruptured primary chordae tendineae with a flail segment causing severe mitral regurgitation.  Presently, right heart pressures are essentially normal with borderline increased PA systolic pressure at 31 mm.  Coronary anatomy  is normal.  He has undergone CT angiography of his vasculature ordered by Dr. Cornelius Moras.  He will be following up with Dr. Cornelius Moras and is tentatively scheduled for mitral valve repair surgery on March 11, 2020.   ECHO 05/07/2020 IMPRESSIONS   1. Left ventricular ejection fraction, by estimation, is 60 to 65%. The  left ventricle has normal function. The left ventricle has no regional  wall motion abnormalities. Left ventricular diastolic function  could not  be evaluated.   2. Right ventricular systolic function is normal. The right ventricular  size is normal. There is normal pulmonary artery systolic pressure.   3. Left atrial size was moderately dilated.   4. Right atrial size was mildly dilated.   5. The mitral valve has been repaired/replaced. Trivial mitral valve  regurgitation. The mean mitral valve gradient is 4.0 mmHg. There is a 32  mm prosthetic annuloplasty ring present in the mitral position. Procedure  Date: 03/11/2020.   6. The aortic valve is tricuspid. There is mild calcification of the  aortic valve. There is mild thickening of the aortic valve. Aortic valve  regurgitation is trivial.   7. The inferior vena cava is normal in size with greater than 50%  respiratory variability, suggesting right atrial pressure of 3 mmHg.    09/10/2020 FINDINGS: CTA CHEST FINDINGS Cardiovascular: Status post surgical repair of ascending thoracic aorta. Status post mitral valve repair. Stable appearance of residual dissection is seen that begins at the origin of the innominate artery and extends through the transverse aortic arch and descending thoracic aorta into the abdominal aorta. Stable dissection flap is seen extending into the left subclavian artery. Normal cardiac size. No pericardial effusion.   Mediastinum/Nodes: No enlarged mediastinal, hilar, or axillary lymph nodes. Thyroid gland, trachea, and esophagus demonstrate no significant findings.   Lungs/Pleura: No pneumothorax or pleural effusion is noted. 6 mm nodule is noted inferiorly in right middle lobe best seen on image number 114 of series 7. No consolidative process is noted.   Musculoskeletal: No chest wall abnormality. No acute or significant osseous findings.   Review of the MIP images confirms the above findings.   CTA ABDOMEN AND PELVIS FINDINGS   VASCULAR   Aorta: Thoracic aortic dissection noted on prior exam extends throughout the abdominal  aorta and into the common and external iliac arteries bilaterally no definite aneurysm is noted.   Celiac: Patent without evidence of aneurysm, dissection, vasculitis or significant stenosis.   SMA: Stable dissection is seen involving the proximal portion of the superior mesenteric artery which is unchanged compared to prior exam.   Renals: Stable dissection is seen extending through the left renal artery. Stable dissection is seen involving the proximal portion of the right renal artery.   IMA: Patent, but arises from the false lumen.   Inflow: As noted above, dissection is seen to extend through the bilateral common and external iliac arteries and into left common femoral artery. This is stable compared to prior exam.   Veins: No obvious venous abnormality within the limitations of this arterial phase study.   Review of the MIP images confirms the above findings.   NON-VASCULAR   Hepatobiliary: No focal liver abnormality is seen. No gallstones, gallbladder wall thickening, or biliary dilatation.   Pancreas: Unremarkable. No pancreatic ductal dilatation or surrounding inflammatory changes.   Spleen: Normal in size without focal abnormality.   Adrenals/Urinary Tract: Adrenal glands are unremarkable. Kidneys are normal, without renal calculi, focal lesion, or hydronephrosis. Bladder is unremarkable.  Stomach/Bowel: Stomach is within normal limits. Appendix appears normal. No evidence of bowel wall thickening, distention, or inflammatory changes. Diverticulosis of descending and sigmoid colon is noted without inflammation.   Lymphatic: No adenopathy is noted.   Reproductive: Prostate is unremarkable.   Other: No abdominal wall hernia or abnormality. No abdominopelvic ascites.   Musculoskeletal: No acute or significant osseous findings.   Review of the MIP images confirms the above findings.   IMPRESSION: Status post surgical repair of ascending thoracic aorta as  well as mitral valve repair.   Grossly stable appearance thoracic aortic dissection that includes the transverse aortic arch and descending thoracic aorta. It extends through the abdominal aorta and into bilateral common and external iliac arteries and left common femoral artery.   Dissection flap is seen involving the left subclavian artery, superior mesenteric artery and both renal arteries.   6 mm nodule is noted in right middle lobe. Non-contrast chest CT at 6-12 months is recommended. If the nodule is stable at time of repeat CT, then future CT at 18-24 months (from today's scan) is considered optional for low-risk patients, but is recommended for high-risk patients. This recommendation follows the consensus statement: Guidelines for Management of Incidental Pulmonary Nodules Detected on CT Images: From the Fleischner Society 2017; Radiology 2017; 284:228-243.   01/26/2021 CLINICAL INFORMATION Sleep Study Type: Split Night CPAP   Indication for sleep study: Fatigue, Snoring   Epworth Sleepiness Score: 18   SLEEP STUDY TECHNIQUE As per the AASM Manual for the Scoring of Sleep and Associated Events v2.3 (April 2016) with a hypopnea requiring 4% desaturations.   The channels recorded and monitored were frontal, central and occipital EEG, electrooculogram (EOG), submentalis EMG (chin), nasal and oral airflow, thoracic and abdominal wall motion, anterior tibialis EMG, snore microphone, electrocardiogram, and pulse oximetry. Continuous positive airway pressure (CPAP) was initiated when the patient met split night criteria and was titrated according to treat sleep-disordered breathing.   MEDICATIONS acetaminophen (TYLENOL) 325 MG tablet albuterol (PROAIR HFA) 108 (90 Base) MCG/ACT inhaler amLODipine (NORVASC) 2.5 MG tablet guaiFENesin (MUCINEX) 600 MG 12 hr tablet levothyroxine (SYNTHROID) 50 MCG tablet metoprolol succinate (TOPROL-XL) 50 MG 24 hr tablet (Expired) montelukast  (SINGULAIR) 10 MG tablet Olopatadine HCl 0.2 % SOLN pantoprazole (PROTONIX) 40 MG tablet simvastatin (ZOCOR) 10 MG tablet warfarin (COUMADIN) 5 MG tablet  Medications self-administered by patient taken the night of the study : N/A   RESPIRATORY PARAMETERS Diagnostic Total AHI (/hr):            25.3     RDI (/hr):         29.8     OA Index (/hr):            0.4       CA Index (/hr):      0.0 REM AHI (/hr):            52.7     NREM AHI (/hr):          20.4     Supine AHI (/hr):         35.3     Non-supine AHI (/hr):        0 Min O2 Sat (%):          82.0     Mean O2 (%):  91.9     Time below 88% (min):           8.4          Titration Optimal Pressure (cm):  18        AHI at Optimal Pressure (/hr):            0          Min O2 at Optimal Pressure (%):       95.0 Supine % at Optimal (%):       0          Sleep % at Optimal (%):         62           SLEEP ARCHITECTURE The recording time for the entire night was 413.4 minutes.   During a baseline period of 167.0 minutes, the patient slept for 135.0 minutes in REM and nonREM, yielding a sleep efficiency of 80.8%%. Sleep onset after lights out was 15.2 minutes with a REM latency of 84.5 minutes. The patient spent 7.8%% of the night in stage N1 sleep, 77.0%% in stage N2 sleep, 0.0%% in stage N3 and 15.2% in REM.   During the titration period of 240.9 minutes, the patient slept for 185.0 minutes in REM and nonREM, yielding a sleep efficiency of 76.8%%. Sleep onset after CPAP initiation was 38.1 minutes with a REM latency of 15.5 minutes. The patient spent 3.5%% of the night in stage N1 sleep, 48.6%% in stage N2 sleep, 0.0%% in stage N3 and 47.8% in REM.   CARDIAC DATA The 2 lead EKG demonstrated sinus rhythm. The mean heart rate was 100.0 beats per minute. Other EKG findings include: None.   LEG MOVEMENT DATA The total Periodic Limb Movements of Sleep (PLMS) were 0. The PLMS index was 0.0 .   IMPRESSIONS - Moderate obstructive sleep  apnea overall during the diagnostic portion of the study (AHI 25.3/h; RDI 29.8/h); however, sleep apnea was severe with supine sleep (AHI 35.3) and during REM sleep (AHI 52.7/h).   CPAP was initiated at 6 cm and was titrated to 18 cm of water; AHI 0, O2 nadir 95. - No significant central sleep apnea occurred during the diagnostic portion of the study (CAI  0.0/hour). - Moderate oxygen desaturation was noted during the diagnostic study to a nadir of 82%. - Snoring was audible during the diagnostic portion of the study.  - No cardiac abnormalities were noted during this study. - Clinically significant periodic limb movements did not occur during sleep.   DIAGNOSIS - Obstructive Sleep Apnea (G47.33)   RECOMMENDATIONS - Recommend an initial trial of CPAP Auto with EPR of 3 at 17 - 20 cm H2O with heated humidification. A Large size Fisher&Paykel Full Face Mask Simplus mask was used for the titration.  - Effort should be made to optimize nasal and oropharyngeal patency.  - Avoid alcohol, sedatives and other CNS depressants that may worsen sleep apnea and disrupt normal sleep architecture. - Sleep hygiene should be reviewed to assess factors that may improve sleep quality. - Weight management and regular exercise should be initiated or continued. - Recommend a download and sleep clinic evaluation after 4 weeks of therapy.    ECHO: 04/15/2021  1. Left ventricular ejection fraction, by estimation, is 60 to 65%. Left  ventricular ejection fraction by 3D volume is 63 %. The left ventricle has  normal function. The left ventricle has no regional wall motion  abnormalities. There is mild asymmetric  left ventricular hypertrophy. Left ventricular diastolic parameters are  consistent with Grade I diastolic dysfunction (impaired relaxation).   2. Right ventricular systolic function is moderately reduced. The right  ventricular size is normal. There  is normal pulmonary artery systolic  pressure.   3.  Left atrial size was moderately dilated.   4. Right atrial size was mildly dilated.   5. The mitral valve has been repaired/replaced. Mild mitral valve  regurgitation.   6. The aortic valve is grossly normal. Aortic valve regurgitation is  mild.   ASSESSMENT:    1. Primary hypertension   2. S/P mitral valve repair   3. S/P aortic dissection repair   4. Chronic thoracic and descending  aortic dissection ([place])   5. OSA (obstructive sleep apnea)   6. PAF (paroxysmal atrial fibrillation) ([place])   7. Anticoagulated on Coumadin   8. Hyperlipidemia LDL goal <100     PLAN:  Mr. Merrel Crabbe is an 81 year old gentleman who has a history of hypertension, and was found to have severe mitral valve prolapse with a flail segment resulting in severe mitral regurgitation.  He had a remote history of sleep apnea which was very severe during supine sleep and had stopped using CPAP therapy over 4 years ago.  He was found to have normal coronary arteries and was admitted for elective mitral valve repair with complex valvuloplasty. His operation was complicated by acute type A aortic dissection necessitating graft repair of his aorta and resuspension of his native aortic valve.  He also had further complications with pericardial effusion requiring pericardial window, atrial fibrillation, anemia, AKA, and also was diagnosed with hypothyroidism.  An echo of May 07, 2020 showed an EF of 60 to 65%.  The left atrium was moderately dilated and the right atrium was mildly dilated.  There was trivial MR.  Subsequently, he is doing well regarding his complex valve surgery and complications.  When I saw him for my initial post hospital evaluation with me he was having significant fatigability, was experiencing nocturia at least 4-5 times per night and was requiring at least 2 naps per day.  At that time I reviewed his sleep study from October 2021 revealed moderate overall sleep apnea but this was very severe during REM  sleep with an AHI at 52.2/h.  Because of his cardiac surgery he never had CPAP titration.  I instituted AutoPap therapy his initial visit with me in February.  At follow-up in April 2022, I reviewed his most recent download increased his pressure settings since his 95th percentile pressure is 14.8 and maximum average pressure is 60.4.  I reduced his ramp time to 15 minutes with ramp start increasing to 6 cm of water and changed his auto mode to 11 up to 18 cm of water.  Most recent renal function is improved with creatinine now 1.19 compared to 1.64.  His CT of his aorta shows extensive chronic dissection through the arch, descending thoracic, abdominal aorta, bilateral iliac arteries and left common femoral artery.  There also was a dissection flap involving the bilateral subclavian arteries, celiac, SMA and bilateral renal arteries.  During his April 2022 evaluation his blood pressure was elevated and amlodipine 5 mg was added to his medical regimen for optimal blood pressure control and I further titrated metoprolol succinate to 50 mg.  At a subsequent evaluation he had completely discontinued CPAP therapy and was also self discontinuing several of his cardiac medications.  When last seen by me in July 2022 off beta-blocker his heart rate had significantly increased to the 90-100 range and metoprolol succinate was reinstituted and he was advised to further titrate amlodipine for more optimal blood pressure control.  He agreed to undergo reevaluation  of his sleep apnea and I thoroughly reviewed this with him today.  On his sleep study on January 26, 2021 he had moderate overall sleep apnea 25.3 but sleep apnea was severe with supine sleep with an AHI of 35.3 and more severe during REM sleep with an AHI of 52.7.  O2 decreased to 82%.  He is now back on CPAP therapy with initial pressure range at 16 to 20 cm of water.  Although he is technically meeting compliance standards he is still not using CPAP with adequate  duration per night averaging only 4 hours and 47 minutes.  I discussed with him that preponderance of REM sleep occurs in the second half of the night and with his very severe sleep apnea it is most important that he use therapy for the nights duration.  Untreated sleep apnea during REM sleep is also associated with increased risk for resistant hypertension.  He continues to be sleepy as endorsed by his Epworth Sleepiness Scale score today at 20.  I provided him with a new mask in the office today to see if this can improve his compliance. He continues to see Dr. Cyndia Bent and at his February 2023 evaluation blood pressure was increased at 170/86.  Optimal blood pressure control is less than 120/80.  Fortunately today blood pressure is optimal on amlodipine 5 mg and metoprolol 50 mg daily.  I have recommended continued close follow-up and we will schedule him to see Fabian Sharp, PA in 3 months and me in 6 months   Medication Adjustments/Labs and Tests Ordered: Current medicines are reviewed at length with the patient today.  Concerns regarding medicines are outlined above.  Medication changes, Labs and Tests ordered today are listed in the Patient Instructions below. Patient Instructions  Medication Instructions:  The current medical regimen is effective;  continue present plan and medications.  *If you need a refill on your cardiac medications before your next appointment, please call your pharmacy*   Follow-Up: At [facility], you and your health needs are our priority.  As part of our continuing mission to provide you with exceptional heart care, we have created designated Provider Care Teams.  These Care Teams include your primary Cardiologist (physician) and Advanced Practice Providers (APPs -  Physician Assistants and Nurse Practitioners) who all work together to provide you with the care you need, when you need it.  We recommend signing up for the patient portal called "MyChart".  Sign up  information is provided on this After Visit Summary.  MyChart is used to connect with patients for Virtual Visits (Telemedicine).  Patients are able to view lab/test results, encounter notes, upcoming appointments, etc.  Non-urgent messages can be sent to your provider as well.   To learn more about what you can do with MyChart, go to [email or web address].    Your next appointment:   3 month(s)  The format for your next appointment:   In Person  Provider:   Doreene Adas PA-C then you will see Dr.Zaccheus Edmister back in 6 months.  {            Signed, Shelva Majestic, MD  09/20/2021 4:23 PM    [facility] [address], [place], [place], [place]  [address] Phone: [phone]

## 2021-09-07 NOTE — Progress Notes (Signed)
PFTs are normal 

## 2021-09-07 NOTE — Progress Notes (Signed)
PFT done today. 

## 2021-09-09 ENCOUNTER — Ambulatory Visit: Payer: Medicare Other | Admitting: Urology

## 2021-09-09 DIAGNOSIS — M5137 Other intervertebral disc degeneration, lumbosacral region: Secondary | ICD-10-CM | POA: Diagnosis not present

## 2021-09-09 DIAGNOSIS — M9905 Segmental and somatic dysfunction of pelvic region: Secondary | ICD-10-CM | POA: Diagnosis not present

## 2021-09-09 DIAGNOSIS — M9904 Segmental and somatic dysfunction of sacral region: Secondary | ICD-10-CM | POA: Diagnosis not present

## 2021-09-09 DIAGNOSIS — M9903 Segmental and somatic dysfunction of lumbar region: Secondary | ICD-10-CM | POA: Diagnosis not present

## 2021-09-10 DIAGNOSIS — M79671 Pain in right foot: Secondary | ICD-10-CM | POA: Diagnosis not present

## 2021-09-10 DIAGNOSIS — M79672 Pain in left foot: Secondary | ICD-10-CM | POA: Diagnosis not present

## 2021-09-10 DIAGNOSIS — L600 Ingrowing nail: Secondary | ICD-10-CM | POA: Diagnosis not present

## 2021-09-13 DIAGNOSIS — M5137 Other intervertebral disc degeneration, lumbosacral region: Secondary | ICD-10-CM | POA: Diagnosis not present

## 2021-09-13 DIAGNOSIS — M9903 Segmental and somatic dysfunction of lumbar region: Secondary | ICD-10-CM | POA: Diagnosis not present

## 2021-09-13 DIAGNOSIS — M9905 Segmental and somatic dysfunction of pelvic region: Secondary | ICD-10-CM | POA: Diagnosis not present

## 2021-09-13 DIAGNOSIS — M9904 Segmental and somatic dysfunction of sacral region: Secondary | ICD-10-CM | POA: Diagnosis not present

## 2021-09-14 DIAGNOSIS — G4733 Obstructive sleep apnea (adult) (pediatric): Secondary | ICD-10-CM | POA: Diagnosis not present

## 2021-09-16 DIAGNOSIS — M5137 Other intervertebral disc degeneration, lumbosacral region: Secondary | ICD-10-CM | POA: Diagnosis not present

## 2021-09-16 DIAGNOSIS — M9904 Segmental and somatic dysfunction of sacral region: Secondary | ICD-10-CM | POA: Diagnosis not present

## 2021-09-16 DIAGNOSIS — M9905 Segmental and somatic dysfunction of pelvic region: Secondary | ICD-10-CM | POA: Diagnosis not present

## 2021-09-16 DIAGNOSIS — M9903 Segmental and somatic dysfunction of lumbar region: Secondary | ICD-10-CM | POA: Diagnosis not present

## 2021-09-20 ENCOUNTER — Encounter: Payer: Self-pay | Admitting: Cardiovascular Disease

## 2021-09-20 DIAGNOSIS — G4733 Obstructive sleep apnea (adult) (pediatric): Secondary | ICD-10-CM | POA: Diagnosis not present

## 2021-09-20 DIAGNOSIS — R0683 Snoring: Secondary | ICD-10-CM | POA: Diagnosis not present

## 2021-09-27 DIAGNOSIS — M9904 Segmental and somatic dysfunction of sacral region: Secondary | ICD-10-CM | POA: Diagnosis not present

## 2021-09-27 DIAGNOSIS — M9903 Segmental and somatic dysfunction of lumbar region: Secondary | ICD-10-CM | POA: Diagnosis not present

## 2021-09-27 DIAGNOSIS — M9905 Segmental and somatic dysfunction of pelvic region: Secondary | ICD-10-CM | POA: Diagnosis not present

## 2021-09-27 DIAGNOSIS — M5137 Other intervertebral disc degeneration, lumbosacral region: Secondary | ICD-10-CM | POA: Diagnosis not present

## 2021-09-29 DIAGNOSIS — M9903 Segmental and somatic dysfunction of lumbar region: Secondary | ICD-10-CM | POA: Diagnosis not present

## 2021-09-29 DIAGNOSIS — M9905 Segmental and somatic dysfunction of pelvic region: Secondary | ICD-10-CM | POA: Diagnosis not present

## 2021-09-29 DIAGNOSIS — M9904 Segmental and somatic dysfunction of sacral region: Secondary | ICD-10-CM | POA: Diagnosis not present

## 2021-09-29 DIAGNOSIS — M5137 Other intervertebral disc degeneration, lumbosacral region: Secondary | ICD-10-CM | POA: Diagnosis not present

## 2021-09-30 DIAGNOSIS — M79675 Pain in left toe(s): Secondary | ICD-10-CM | POA: Diagnosis not present

## 2021-09-30 DIAGNOSIS — L600 Ingrowing nail: Secondary | ICD-10-CM | POA: Diagnosis not present

## 2021-09-30 DIAGNOSIS — M79674 Pain in right toe(s): Secondary | ICD-10-CM | POA: Diagnosis not present

## 2021-09-30 DIAGNOSIS — B351 Tinea unguium: Secondary | ICD-10-CM | POA: Diagnosis not present

## 2021-10-04 DIAGNOSIS — M9905 Segmental and somatic dysfunction of pelvic region: Secondary | ICD-10-CM | POA: Diagnosis not present

## 2021-10-04 DIAGNOSIS — M9903 Segmental and somatic dysfunction of lumbar region: Secondary | ICD-10-CM | POA: Diagnosis not present

## 2021-10-04 DIAGNOSIS — M5137 Other intervertebral disc degeneration, lumbosacral region: Secondary | ICD-10-CM | POA: Diagnosis not present

## 2021-10-04 DIAGNOSIS — M9904 Segmental and somatic dysfunction of sacral region: Secondary | ICD-10-CM | POA: Diagnosis not present

## 2021-10-11 DIAGNOSIS — M9905 Segmental and somatic dysfunction of pelvic region: Secondary | ICD-10-CM | POA: Diagnosis not present

## 2021-10-11 DIAGNOSIS — M9903 Segmental and somatic dysfunction of lumbar region: Secondary | ICD-10-CM | POA: Diagnosis not present

## 2021-10-11 DIAGNOSIS — M5137 Other intervertebral disc degeneration, lumbosacral region: Secondary | ICD-10-CM | POA: Diagnosis not present

## 2021-10-11 DIAGNOSIS — M9904 Segmental and somatic dysfunction of sacral region: Secondary | ICD-10-CM | POA: Diagnosis not present

## 2021-10-18 ENCOUNTER — Ambulatory Visit: Payer: Medicare Other

## 2021-10-18 DIAGNOSIS — M9905 Segmental and somatic dysfunction of pelvic region: Secondary | ICD-10-CM | POA: Diagnosis not present

## 2021-10-18 DIAGNOSIS — Z9889 Other specified postprocedural states: Secondary | ICD-10-CM

## 2021-10-18 DIAGNOSIS — M9904 Segmental and somatic dysfunction of sacral region: Secondary | ICD-10-CM | POA: Diagnosis not present

## 2021-10-18 DIAGNOSIS — I71019 Dissection of thoracic aorta, unspecified: Secondary | ICD-10-CM

## 2021-10-18 DIAGNOSIS — M9903 Segmental and somatic dysfunction of lumbar region: Secondary | ICD-10-CM | POA: Diagnosis not present

## 2021-10-18 DIAGNOSIS — M5137 Other intervertebral disc degeneration, lumbosacral region: Secondary | ICD-10-CM | POA: Diagnosis not present

## 2021-10-18 LAB — POCT INR: INR: 2.5 (ref 2.0–3.0)

## 2021-10-18 NOTE — Patient Instructions (Signed)
Description   Continue taking 1 tablet daily except 1/2 tablet each Sunday.   - Repeat INR in 8 weeks

## 2021-10-19 ENCOUNTER — Ambulatory Visit: Payer: Medicare Other

## 2021-10-20 DIAGNOSIS — R0683 Snoring: Secondary | ICD-10-CM | POA: Diagnosis not present

## 2021-10-20 DIAGNOSIS — G4733 Obstructive sleep apnea (adult) (pediatric): Secondary | ICD-10-CM | POA: Diagnosis not present

## 2021-10-21 DIAGNOSIS — H9113 Presbycusis, bilateral: Secondary | ICD-10-CM | POA: Diagnosis not present

## 2021-10-21 DIAGNOSIS — R42 Dizziness and giddiness: Secondary | ICD-10-CM | POA: Diagnosis not present

## 2021-10-21 DIAGNOSIS — J300 Vasomotor rhinitis: Secondary | ICD-10-CM | POA: Diagnosis not present

## 2021-10-25 DIAGNOSIS — M9903 Segmental and somatic dysfunction of lumbar region: Secondary | ICD-10-CM | POA: Diagnosis not present

## 2021-10-25 DIAGNOSIS — M9904 Segmental and somatic dysfunction of sacral region: Secondary | ICD-10-CM | POA: Diagnosis not present

## 2021-10-25 DIAGNOSIS — M9905 Segmental and somatic dysfunction of pelvic region: Secondary | ICD-10-CM | POA: Diagnosis not present

## 2021-10-25 DIAGNOSIS — M5137 Other intervertebral disc degeneration, lumbosacral region: Secondary | ICD-10-CM | POA: Diagnosis not present

## 2021-10-27 DIAGNOSIS — G4733 Obstructive sleep apnea (adult) (pediatric): Secondary | ICD-10-CM | POA: Diagnosis not present

## 2021-10-28 DIAGNOSIS — B351 Tinea unguium: Secondary | ICD-10-CM | POA: Diagnosis not present

## 2021-10-28 DIAGNOSIS — L600 Ingrowing nail: Secondary | ICD-10-CM | POA: Diagnosis not present

## 2021-10-28 DIAGNOSIS — M79674 Pain in right toe(s): Secondary | ICD-10-CM | POA: Diagnosis not present

## 2021-10-28 DIAGNOSIS — M79675 Pain in left toe(s): Secondary | ICD-10-CM | POA: Diagnosis not present

## 2021-11-01 ENCOUNTER — Telehealth: Payer: Self-pay | Admitting: Pulmonary Disease

## 2021-11-01 NOTE — Telephone Encounter (Signed)
Called patient and he states he thinks he needs a bipap machine. He states he saw Dr Halford Chessman in 2016. Sir do you want me to refer him back to Dr Halford Chessman in regards to sleep. He states he is on cpap. Or can I order a cpap titration study to see if he needs a bipap machine.  Please advise sir

## 2021-11-01 NOTE — Telephone Encounter (Signed)
Called patent and advised him that he needs to contact Dr Claiborne Billings office in regards to his cpap machine. Patient states that he has attempted to call Dr Claiborne Billings office last week and is unhappy. Saw Dr Halford Chessman in 2016. Wants to know if he can see Dr Halford Chessman again.   Sir are you willing to see patient again or have him stick with Dr Claiborne Billings.  Please advise

## 2021-11-01 NOTE — Telephone Encounter (Signed)
Looks like Dr. Claiborne Billings with Cardiology is managing the CPAP (at least read the sleep study in fall 2022). Would direct patient back to Dr. Claiborne Billings for recommendations.

## 2021-11-03 ENCOUNTER — Telehealth: Payer: Self-pay | Admitting: *Deleted

## 2021-11-03 ENCOUNTER — Encounter: Payer: Self-pay | Admitting: Cardiovascular Disease

## 2021-11-03 NOTE — Telephone Encounter (Signed)
Error

## 2021-11-03 NOTE — Telephone Encounter (Signed)
To Dr Claiborne Billings: Patient called in with concerns about his cpap. He has an Airsense 11 that is running at max capacity and it still wakes him up at night. He is only sleeping 3-4 hours. He would like to discuss other options at this point.

## 2021-11-04 DIAGNOSIS — H903 Sensorineural hearing loss, bilateral: Secondary | ICD-10-CM | POA: Diagnosis not present

## 2021-11-04 NOTE — Telephone Encounter (Signed)
Please schedule an ROV with me or one of the NPs to assess current status of his sleep apnea and determine best treatment options.

## 2021-11-04 NOTE — Telephone Encounter (Signed)
Can we call and schedule pt with either Dr Halford Chessman or an NP for sleep.   This is to access how he is doing in regards to sleep and best treatment options.   NEW PATIENT APPT PLEASE!!!  Thank you

## 2021-11-08 ENCOUNTER — Ambulatory Visit: Payer: Medicare Other | Admitting: Nurse Practitioner

## 2021-11-08 ENCOUNTER — Encounter: Payer: Self-pay | Admitting: Nurse Practitioner

## 2021-11-08 VITALS — BP 146/70 | HR 71 | Temp 97.7°F | Ht 70.0 in | Wt 185.2 lb

## 2021-11-08 DIAGNOSIS — J309 Allergic rhinitis, unspecified: Secondary | ICD-10-CM | POA: Insufficient documentation

## 2021-11-08 DIAGNOSIS — G4733 Obstructive sleep apnea (adult) (pediatric): Secondary | ICD-10-CM | POA: Diagnosis not present

## 2021-11-08 DIAGNOSIS — J449 Chronic obstructive pulmonary disease, unspecified: Secondary | ICD-10-CM | POA: Diagnosis not present

## 2021-11-08 DIAGNOSIS — J3089 Other allergic rhinitis: Secondary | ICD-10-CM | POA: Diagnosis not present

## 2021-11-08 MED ORDER — BUDESONIDE-FORMOTEROL FUMARATE 80-4.5 MCG/ACT IN AERO: 2.0000 | INHALATION_SPRAY | Freq: Two times a day (BID) | RESPIRATORY_TRACT | 5 refills | Status: DC

## 2021-11-08 NOTE — Progress Notes (Signed)
Reviewed and agree with assessment/plan.   Chesley Mires, MD [facility] Pulmonary/Critical Care 11/08/2021, 1:30 PM Pager:  [phone]

## 2021-11-08 NOTE — Assessment & Plan Note (Addendum)
He is having breakthrough events with residual AHI of 7.3 and persistent symptoms as a result. Pressure settings appear to be set appropriately and he is not having any significant mask leaks; he is requiring 19.5 the majority of the time so I would not adjust him down even though he feels as though there is some air leaking out. I am concerned he may need BiPAP therapy to appropriately control him. CPAP titration ordered today with instructions to transition to BiPAP. He was happy with this plan.  Patient Instructions  Start Symbicort 2 puffs Twice daily. Brush tongue and rinse mouth afterwards  Continue Albuterol inhaler 2 puffs every 6 hours as needed for shortness of breath or wheezing. Notify if symptoms persist despite rescue inhaler/neb use.  Continue singulair 1 tab At bedtime   Continue to use CPAP every night, minimum of 4-6 hours a night.  CPAP titration ordered to see if you need to switch to BiPAP - someone will contact you to get this scheduled  Follow up after CPAP titration in 6-8 weeks with Katie Destine Ambroise,NP. We will schedule you a new patient appt with Dr. Halford Chessman in 3 months to get re-established with him for sleep. If symptoms do not improve or worsen, please contact office for sooner follow up or seek emergency care.

## 2021-11-08 NOTE — Assessment & Plan Note (Signed)
Recommended he start intranasal steroid with flonase - he already has this at home and agreeable to restarting. Suspect this is contributing to his cough. Continue singulair for trigger prevention. Advised that he may need to use OTC antihistamine during allergy season.

## 2021-11-08 NOTE — Patient Instructions (Addendum)
Start Symbicort 2 puffs Twice daily. Brush tongue and rinse mouth afterwards  Continue Albuterol inhaler 2 puffs every 6 hours as needed for shortness of breath or wheezing. Notify if symptoms persist despite rescue inhaler/neb use.  Continue singulair 1 tab At bedtime   Continue to use CPAP every night, minimum of 4-6 hours a night.  CPAP titration ordered to see if you need to switch to BiPAP - someone will contact you to get this scheduled  Follow up after CPAP titration in 6-8 weeks with Katie Jonanthan Bolender,NP. We will schedule you a new patient appt with Dr. Halford Chessman in 3 months to get re-established with him for sleep. If symptoms do not improve or worsen, please contact office for sooner follow up or seek emergency care.

## 2021-11-08 NOTE — Assessment & Plan Note (Signed)
History of childhood and adult asthma. Off controller therapies for many years. Recent PFTs with mild to moderate obstruction and midflow reversibility. He seems to have an allergic component and environmental triggers as well. Still having daily symptoms and requiring SABA multiple times a week. We will start him on ICS/LABA therapy with Symbicort 80 2 puffs Twice daily. Continue PRN albuterol. Continue singulair for trigger prevention.

## 2021-11-08 NOTE — Progress Notes (Signed)
$'@Patient'B$  ID: Patrick Bell, male    DOB: Aug 14, 1940, 81 y.o.   MRN: [id number]  Chief Complaint  Patient presents with   Follow-up    Patient states he is having trouble with his cpap machine.     Referring provider: Dettinger, Fransisca Kaufmann, MD  HPI: 81 year old male, former smoker followed for DOE.  He is a patient Dr. Kavin Leech and last seen in office 08/18/2021 for consultation for DOE.  He also has a history of OSA and is on CPAP, which is managed by Dr. Claiborne Billings.  Previously followed by Dr. Halford Chessman. Past medical history significant for hypertension, severe MR status post repair, PAF on Coumadin, hypothyroidism, BPH, HLD, history of aortic dissection status postrepair.  TEST/EVENTS:  01/26/2021 split-night: Total AHI 25.3/h, supine AHI 35.3.  SPO2 low 82%. >>>  CPAP 18 cmH2O 09/07/2021 PFTs: FVC 88, FEV1 79, ratio 62, TLC 96, DLCOunc 89.  No significant BD in FEV1 but did have mid flow reversibility.  Mild to moderate obstructive disease  11/08/2021: Today - follow up Patient presents today with his wife for follow up and to reestablish management for his OSA on CPAP. He is currently followed by Dr. Claiborne Billings for management of this but he would like to switch back to Dr. Halford Chessman, who he saw in the past around 4-5 years ago. He has been having some issues with his CPAP machine and is still having symptoms, despite wearing it most nights. He does admit that he doesn't always wear it the whole night because he constantly gets woken up due to air blowing around his mask and into his eyes, even though his machine does not read any significant leaks. He feels like the mask doesn't fit him well and just feels as though he is not currently well controlled. He does not feel rested in the morning and has to take a nap about 2 hours after waking up in the morning. He denies morning headaches or drowsy driving.  Regarding his breathing, he was seen by Dr. Silas Flood in May for consultation of DOE s/p mitral valve  repair. He underwent pulmonary function testing, which showed mild to moderate obstruction with midflow reversibility. He does have a history of asthma but has not been on a maintenance inhaler in years. He continues to have trouble with his breathing. He gets winded with shopping and climbing. Can walk short distances without difficulty. He notices it's worse outside, especially during allergy season. Also notices an occasional wheeze. He does have a dry cough, which he attributes to his postnasal drainage. Not currently using any nasal sprays. He denies orthopnea, PND, leg swelling, hemoptysis, anorexia, weight loss. He uses his albuterol rescue at least twice a week. He takes singulair at bedtime.   09/30/2021-10/29/2021: CPAP 16-20 cmH2O 30/30 days used; 73% >4 hr, average usage 4 hours 41 min Pressure median 16.8, 95th 19.5 Leaks median 0.1, 95th 5.4 AHI 7.3  No Known Allergies  Immunization History  Administered Date(s) Administered   Fluad Quad(high Dose 65+) 02/13/2019, 01/06/2020, 02/05/2021   Influenza, High Dose Seasonal PF 01/23/2018   Influenza,inj,Quad PF,6+ Mos 01/14/2013, 03/14/2014, 03/11/2015, 01/19/2016, 01/18/2017   Moderna Sars-Covid-2 Vaccination 05/10/2019, 06/07/2019   Pneumococcal Conjugate-13 03/14/2014   Pneumococcal Polysaccharide-23 01/14/2013   Tdap 07/06/2020   Zoster Recombinat (Shingrix) 07/08/2021   Zoster, Live 06/14/2011    Past Medical History:  Diagnosis Date   Acute thoracic aortic dissection ([place]) 03/11/2020   Type A   Allergy    Asthma  Cataract    Cataract    Diverticulitis    Dyspnea    GERD (gastroesophageal reflux disease)    Heart murmur 12/2019   Hyperlipidemia    Hypertension    Inguinal hernia bilateral, non-recurrent    S/P aortic dissection repair 03/11/2020   supracoronary straight graft repair with resuspension of native aortic valve and open distal anastomosis for intraoperative acute type A aortic dissection    S/P mitral  valve repair 03/11/2020   Complex valvuloplasty including artificial Gore-tex neochord placement x 4, suture plication of posterior leaflet and posteromedial commissure and 32 mm Sorin Memo 4D ring annuloplasty   Sleep apnea     Tobacco History: Social History   Tobacco Use  Smoking Status Former   Packs/day: 4.00   Years: 25.00   Total pack years: 100.00   Types: Cigarettes   Quit date: 06/23/1982   Years since quitting: 39.4  Smokeless Tobacco Never  Tobacco Comments   pt was a truck Technical sales engineer given: Not 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     albuterol (VENTOLIN HFA) 108 (90 Base) MCG/ACT inhaler INHALE 2 PUFFS EVERY 6 HOURS AS NEEDED FOR WHEEZING OR SHORTNESS OF BREATH 8.5 g 2   amLODipine (NORVASC) 5 MG tablet Take 1 tablet (5 mg total) by mouth daily. 90 tablet 3   Elastic Bandages & Supports (ABDOMINAL BINDER/ELASTIC LARGE) MISC 1 each by Does not apply route daily. 1 each 0   guaiFENesin (MUCINEX) 600 MG 12 hr tablet Take 600 mg by mouth every 4 (four) hours as needed for cough or to loosen phlegm.     levothyroxine (SYNTHROID) 50 MCG tablet Take 1 tablet (50 mcg total) by mouth daily. 90 tablet 3   metoprolol succinate (TOPROL-XL) 50 MG 24 hr tablet Take 1 tablet by mouth at night 90 tablet 3   montelukast (SINGULAIR) 10 MG tablet TAKE 1 TABLET DAILY 90 tablet 3   Olopatadine HCl 0.2 % SOLN Place 1 drop into both eyes daily as needed (allergies).     pantoprazole (PROTONIX) 40 MG tablet TAKE 1 TABLET DAILY 90 tablet 3   simvastatin (ZOCOR) 10 MG tablet TAKE ONE TABLET AT BEDTIME 90 tablet 3   warfarin (COUMADIN) 5 MG tablet TAKE ONE TABLET ONCE DAILY AT 4PM 90 tablet 3   No facility-administered medications prior to visit.     Review of Systems:    Constitutional: No weight loss or gain, night sweats, fevers, chills,or lassitude. +excessive daytime fatigue HEENT: No headaches, difficulty swallowing, tooth/dental problems, or sore throat. No sneezing, itching, ear ache. +nasal congestion, post nasal drip CV:  No chest pain, orthopnea, PND, swelling in lower extremities, anasarca, dizziness, palpitations, syncope Resp: +shortness of breath with exertion (unchanged); wheezing; dry cough. No excess mucus or change in color of mucus.  No hemoptysis.  No chest wall deformity GI:  No heartburn, indigestion, abdominal pain, nausea, vomiting, diarrhea, change in bowel habits, loss of appetite, bloody stools.  GU: No dysuria, change in color of urine, urgency or frequency.  No flank pain, no hematuria  Skin: No rash, lesions, ulcerations MSK:  No joint pain or swelling.  No decreased range of motion.  No back pain. Neuro: No dizziness or lightheadedness.  Psych: No depression or anxiety. Mood  stable.     Physical Exam:  BP (!) 146/70 (BP Location: Right Arm, Patient Position: Sitting, Cuff Size: Normal)   Pulse 71   Temp 97.7 F (36.5 C) (Oral)   Ht '5\' 10"'$  (1.778 m)   Wt 185 lb 3.2 oz (84 kg)   SpO2 98%   BMI 26.57 kg/m   GEN: Pleasant, interactive, well-appearing; in no acute distress. HEENT:  Normocephalic and atraumatic.  PERRLA. Sclera white. Nasal turbinates pink, moist and patent bilaterally. Clear rhinorrhea present. Oropharynx pink and moist, without exudate or edema. No lesions, ulcerations, or postnasal drip.  NECK:  Supple w/ fair ROM. No JVD present. Normal carotid impulses w/o bruits. Thyroid symmetrical with no goiter or nodules palpated. No lymphadenopathy.   CV: RRR, no m/r/g, no peripheral edema. Pulses intact, +2 bilaterally. No cyanosis, pallor or clubbing. PULMONARY:  Unlabored, regular breathing. Clear bilaterally A&P w/o wheezes/rales/rhonchi. No accessory muscle use. No dullness to percussion. GI: BS present and  normoactive. Soft, non-tender to palpation. No organomegaly or masses detected. No CVA tenderness. MSK: No erythema, warmth or tenderness. Cap refil <2 sec all extrem. No deformities or joint swelling noted.  Neuro: A/Ox3. No focal deficits noted.   Skin: Warm, no lesions or rashe Psych: Normal affect and behavior. Judgement and thought content appropriate.     Lab Results:  CBC    Component Value Date/Time   WBC 5.0 07/08/2021 1104   WBC 8.3 12/02/2020 1851   RBC 5.18 07/08/2021 1104   RBC 5.34 12/02/2020 1851   HGB 14.0 07/08/2021 1104   HCT 43.6 07/08/2021 1104   PLT 176 07/08/2021 1104   MCV 84 07/08/2021 1104   MCH 27.0 07/08/2021 1104   MCH 27.7 12/02/2020 1851   MCHC 32.1 07/08/2021 1104   MCHC 31.7 12/02/2020 1851   RDW 14.9 07/08/2021 1104   LYMPHSABS 0.9 07/08/2021 1104   MONOABS 0.6 12/02/2020 1851   EOSABS 0.1 07/08/2021 1104   BASOSABS 0.1 07/08/2021 1104    BMET    Component Value Date/Time   NA 144 07/08/2021 1104   K 4.3 07/08/2021 1104   CL 105 07/08/2021 1104   CO2 25 07/08/2021 1104   GLUCOSE 84 07/08/2021 1104   GLUCOSE 105 (H) 12/02/2020 1851   BUN 16 07/08/2021 1104   CREATININE 1.12 07/08/2021 1104   CREATININE 0.91 06/18/2012 1031   CALCIUM 9.0 07/08/2021 1104   GFRNONAA >60 12/02/2020 1851   GFRNONAA 84 06/18/2012 1031   GFRAA 45 (L) 04/27/2020 1705   GFRAA >89 06/18/2012 1031    BNP    Component Value Date/Time   BNP 112.7 (H) 09/10/2020 1154     Imaging:  No results found.       Latest Ref Rng & Units 09/07/2021   11:54 AM  PFT Results  FVC-Pre L 3.50   FVC-Predicted Pre % 88   FVC-Post L 3.80   FVC-Predicted Post % 96   Pre FEV1/FVC % % 63   Post FEV1/FCV % % 62   FEV1-Pre L 2.21   FEV1-Predicted Pre % 79   FEV1-Post L 2.36   DLCO uncorrected ml/min/mmHg 21.70   DLCO UNC% % 89   DLCO corrected ml/min/mmHg 21.70   DLCO COR %Predicted % 89   DLVA Predicted % 84   TLC L 6.83   TLC % Predicted % 96   RV %  Predicted % 116     No results found for: "NITRICOXIDE"      Assessment & Plan:  OSA (obstructive sleep apnea) He is having breakthrough events with residual AHI of 7.3 and persistent symptoms as a result. Pressure settings appear to be set appropriately and he is not having any significant mask leaks; he is requiring 19.5 the majority of the time so I would not adjust him down even though he feels as though there is some air leaking out. I am concerned he may need BiPAP therapy to appropriately control him. CPAP titration ordered today with instructions to transition to BiPAP. He was happy with this plan.  Patient Instructions  Start Symbicort 2 puffs Twice daily. Brush tongue and rinse mouth afterwards  Continue Albuterol inhaler 2 puffs every 6 hours as needed for shortness of breath or wheezing. Notify if symptoms persist despite rescue inhaler/neb use.  Continue singulair 1 tab At bedtime   Continue to use CPAP every night, minimum of 4-6 hours a night.  CPAP titration ordered to see if you need to switch to BiPAP - someone will contact you to get this scheduled  Follow up after CPAP titration in 6-8 weeks with Katie Jozsef Wescoat,NP. We will schedule you a new patient appt with Dr. Halford Chessman in 3 months to get re-established with him for sleep. If symptoms do not improve or worsen, please contact office for sooner follow up or seek emergency care.     Asthma-COPD overlap syndrome ([place]) History of childhood and adult asthma. Off controller therapies for many years. Recent PFTs with mild to moderate obstruction and midflow reversibility. He seems to have an allergic component and environmental triggers as well. Still having daily symptoms and requiring SABA multiple times a week. We will start him on ICS/LABA therapy with Symbicort 80 2 puffs Twice daily. Continue PRN albuterol. Continue singulair for trigger prevention.  Allergic rhinitis Recommended he start intranasal steroid with flonase -  he already has this at home and agreeable to restarting. Suspect this is contributing to his cough. Continue singulair for trigger prevention. Advised that he may need to use OTC antihistamine during allergy season.    I spent 41 minutes of dedicated to the care of this patient on the date of this encounter to include pre-visit review of records, face-to-face time with the patient discussing conditions above, post visit ordering of testing, clinical documentation with the electronic health record, making appropriate referrals as documented, and communicating necessary findings to members of the patients care team.  Clayton Bibles, NP 11/08/2021  Pt aware and understands NP's role.

## 2021-11-15 DIAGNOSIS — D3132 Benign neoplasm of left choroid: Secondary | ICD-10-CM | POA: Diagnosis not present

## 2021-11-15 DIAGNOSIS — H26491 Other secondary cataract, right eye: Secondary | ICD-10-CM | POA: Diagnosis not present

## 2021-11-15 DIAGNOSIS — H527 Unspecified disorder of refraction: Secondary | ICD-10-CM | POA: Diagnosis not present

## 2021-11-15 DIAGNOSIS — H524 Presbyopia: Secondary | ICD-10-CM | POA: Diagnosis not present

## 2021-11-19 ENCOUNTER — Ambulatory Visit (INDEPENDENT_AMBULATORY_CARE_PROVIDER_SITE_OTHER): Payer: Medicare Other

## 2021-11-19 VITALS — Wt 185.0 lb

## 2021-11-19 DIAGNOSIS — Z0000 Encounter for general adult medical examination without abnormal findings: Secondary | ICD-10-CM

## 2021-11-19 NOTE — Progress Notes (Signed)
Subjective:   Patrick Bell is a 81 y.o. male who presents for Medicare Annual/Subsequent preventive examination.  Virtual Visit via Telephone Note  I connected with  Patrick Bell on 11/19/21 at  2:00 PM EDT by telephone and verified that I am speaking with the correct person using two identifiers.  Location: Patient: Home Provider: [facility] Persons participating in the virtual visit: patient/Nurse Health Advisor   I discussed the limitations, risks, security and privacy concerns of performing an evaluation and management service by telephone and the availability of in person appointments. The patient expressed understanding and agreed to proceed.  Interactive audio and video telecommunications were attempted between this nurse and patient, however failed, due to patient having technical difficulties OR patient did not have access to video capability.  We continued and completed visit with audio only.  Some vital signs may be absent or patient reported.   Destani Wamser E Sophina Mitten, LPN   Review of Systems     Cardiac Risk Factors include: advanced age (>60mn, >>50women);dyslipidemia;hypertension;male gender;Other (see comment), Risk factor comments: A.Fib, OSA on CPAP, COPD, Anemia     Objective:    Today's Vitals   11/19/21 1407 11/19/21 1408  Weight: 185 lb (83.9 kg)   PainSc:  3    Body mass index is 26.54 kg/m.     01/26/2021    8:18 PM 12/02/2020    6:04 PM 10/16/2020   11:19 AM 03/31/2020    5:13 PM 03/13/2020    3:00 PM 03/09/2020   11:37 AM 02/21/2020    6:04 AM  Advanced Directives  Does Patient Have a Medical Advance Directive? No No No No No No No  Would patient like information on creating a medical advance directive? No - Patient declined No - Patient declined No - Patient declined No - Patient declined No - Patient declined Yes (MAU/Ambulatory/Procedural Areas - Information given) No - Patient declined    Current Medications (verified) Outpatient Encounter  Medications as of 11/19/2021  Medication Sig   acetaminophen (TYLENOL) 325 MG tablet Take 1-2 tablets (325-650 mg total) by mouth every 4 (four) hours as needed for mild pain.   albuterol (VENTOLIN HFA) 108 (90 Base) MCG/ACT inhaler INHALE 2 PUFFS EVERY 6 HOURS AS NEEDED FOR WHEEZING OR SHORTNESS OF BREATH   amLODipine (NORVASC) 5 MG tablet Take 1 tablet (5 mg total) by mouth daily.   budesonide-formoterol (SYMBICORT) 80-4.5 MCG/ACT inhaler Inhale 2 puffs into the lungs in the morning and at bedtime.   Elastic Bandages & Supports (ABDOMINAL BINDER/ELASTIC LARGE) MISC 1 each by Does not apply route daily.   guaiFENesin (MUCINEX) 600 MG 12 hr tablet Take 600 mg by mouth every 4 (four) hours as needed for cough or to loosen phlegm.   ipratropium (ATROVENT) 0.03 % nasal spray SMARTSIG:2 Puff(s) Both Nares 2-3 Times Daily   levothyroxine (SYNTHROID) 50 MCG tablet Take 1 tablet (50 mcg total) by mouth daily.   metoprolol succinate (TOPROL-XL) 50 MG 24 hr tablet Take 1 tablet by mouth at night   montelukast (SINGULAIR) 10 MG tablet TAKE 1 TABLET DAILY   Olopatadine HCl 0.2 % SOLN Place 1 drop into both eyes daily as needed (allergies).   pantoprazole (PROTONIX) 40 MG tablet TAKE 1 TABLET DAILY   simvastatin (ZOCOR) 10 MG tablet TAKE ONE TABLET AT BEDTIME   terbinafine (LAMISIL) 250 MG tablet Take 250 mg by mouth daily.   warfarin (COUMADIN) 5 MG tablet TAKE ONE TABLET ONCE DAILY AT 4PM  No facility-administered encounter medications on file as of 11/19/2021.    Allergies (verified) Patient has no known allergies.   History: Past Medical History:  Diagnosis Date   Acute thoracic aortic dissection (HCC) 03/11/2020   Type A   Allergy    Asthma    Cataract    Cataract    Diverticulitis    Dyspnea    GERD (gastroesophageal reflux disease)    Heart murmur 12/2019   Hyperlipidemia    Hypertension    Inguinal hernia bilateral, non-recurrent    S/P aortic dissection repair 03/11/2020    supracoronary straight graft repair with resuspension of native aortic valve and open distal anastomosis for intraoperative acute type A aortic dissection    S/P mitral valve repair 03/11/2020   Complex valvuloplasty including artificial Gore-tex neochord placement x 4, suture plication of posterior leaflet and posteromedial commissure and 32 mm Sorin Memo 4D ring annuloplasty   Sleep apnea    Past Surgical History:  Procedure Laterality Date   bilateral inguinal hernia     CARDIAC CATHETERIZATION Bilateral 02/21/2020   CHEST TUBE INSERTION N/A 03/22/2020   Procedure: CHEST TUBE INSERTION OF 28 BLAKE DRAIN.;  Surgeon: Grace Isaac, MD;  Location: [facility];  Service: Thoracic;  Laterality: N/A;   COLONOSCOPY  06/09/2004   [facility]:[id number] colon diverticulosis, otherwise normal colonoscopy   EYE SURGERY  08/2014   HERNIA REPAIR     MITRAL VALVE REPAIR N/A 03/11/2020   Procedure: MITRAL VALVE REPAIR (MVR) USING MEMO 4D SIZE 32 RING;  Surgeon: Rexene Alberts, MD;  Location: [facility];  Service: Open Heart Surgery;  Laterality: N/A;   PALATE / UVULA BIOPSY / EXCISION     PERICARDIAL WINDOW N/A 03/22/2020   Procedure: SUBXYPHOID POST-OP PERICARDIAL FLUID DRAINAGE WITH CHEST TUBE INSERTION.;  Surgeon: Grace Isaac, MD;  Location: [place];  Service: Thoracic;  Laterality: N/A;   REPAIR OF ACUTE ASCENDING THORACIC AORTIC DISSECTION N/A 03/11/2020   Procedure: REPAIR OF ACUTE ASCENDING THORACIC AORTIC DISSECTION USING A HEMASHIELD PLATINUM 26MM STRAIGHT GRAFT AND A HEASHIELD PLATINUM 28X10MM SINGLE ARM GRAFT;  Surgeon: Rexene Alberts, MD;  Location: [place];  Service: Open Heart Surgery;  Laterality: N/A;   RIGHT/LEFT HEART CATH AND CORONARY ANGIOGRAPHY N/A 02/21/2020   Procedure: RIGHT/LEFT HEART CATH AND CORONARY ANGIOGRAPHY;  Surgeon: Troy Sine, MD;  Location: [facility];  Service: Cardiovascular;  Laterality: N/A;   TEE WITHOUT CARDIOVERSION N/A 12/19/2019   Procedure: TRANSESOPHAGEAL  ECHOCARDIOGRAM (TEE);  Surgeon: Buford Dresser, MD;  Location: [facility];  Service: Cardiovascular;  Laterality: N/A;   TEE WITHOUT CARDIOVERSION N/A 03/11/2020   Procedure: TRANSESOPHAGEAL ECHOCARDIOGRAM (TEE);  Surgeon: Rexene Alberts, MD;  Location: [place];  Service: Open Heart Surgery;  Laterality: N/A;   TEE WITHOUT CARDIOVERSION N/A 03/22/2020   Procedure: TRANSESOPHAGEAL ECHOCARDIOGRAM (TEE);  Surgeon: Grace Isaac, MD;  Location: [facility];  Service: Thoracic;  Laterality: N/A;   TONSILLECTOMY     VASECTOMY     Family History  Problem Relation Age of Onset   Heart disease Mother    Hip fracture Mother 62   Osteoporosis Mother    Dementia Mother 46   Arthritis Father    Heart disease Father    Arthritis Sister    Arthritis Brother    Colon cancer Neg Hx    Social History   Socioeconomic History   Marital status: Married    Spouse name: Wells Guiles   Number of children: 2  Years of education: 19   Highest education level: Some college, no degree  Occupational History   Occupation: retired    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    Comment: truck driving  Tobacco Use   Smoking status: Former    Packs/day: 4.00    Years: 25.00    Total pack years: 100.00    Types: Cigarettes    Quit date: 06/23/1982    Years since quitting: 39.4   Smokeless tobacco: Never   Tobacco comments:    pt was a truck Land Use: Never used  Substance and Sexual Activity   Alcohol use: Yes    Alcohol/week: 5.0 standard drinks of alcohol    Types: 5 Shots of liquor per week    Comment: brandy a few times a month, beer once a month   Drug use: No   Sexual activity: Yes    Birth control/protection: Surgical  Other Topics Concern   Not on file  Social History Narrative   Not on file   Social Determinants of Health   Financial Resource Strain: Low Risk  (11/19/2021)   Overall Financial Resource Strain (CARDIA)    Difficulty of Paying Living Expenses: Not hard at  all  Food Insecurity: No Food Insecurity (11/19/2021)   Hunger Vital Sign    Worried About Running Out of Food in the Last Year: Never true    Ran Out of Food in the Last Year: Never true  Transportation Needs: No Transportation Needs (11/19/2021)   PRAPARE - Hydrologist (Medical): No    Lack of Transportation (Non-Medical): No  Physical Activity: Sufficiently Active (11/19/2021)   Exercise Vital Sign    Days of Exercise per Week: 5 days    Minutes of Exercise per Session: 40 min  Stress: No Stress Concern Present (11/19/2021)   [place]    Feeling of Stress : Not at all  Social Connections: [place] (11/19/2021)   Social Connection and Isolation Panel [NHANES]    Frequency of Communication with Friends and Family: More than three times a week    Frequency of Social Gatherings with Friends and Family: More than three times a week    Attends Religious Services: More than 4 times per year    Active Member of [employer] or Organizations: Yes    Attends Music therapist: More than 4 times per year    Marital Status: Married    Tobacco Counseling Counseling given: Not Answered 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 no  Interpreter Needed?: No  Information entered by :: Maliq Pilley, LPN   Activities of Daily Living    11/19/2021    2:11 PM 12/03/2020    2:18 PM   In your present state of health, do you have any difficulty performing the following activities:  Hearing? 0   Vision? 0   Difficulty concentrating or making decisions? 0  Walking or climbing stairs? 0   Dressing or bathing? 0   Doing errands, shopping? 0 0  Preparing Food and eating ? N   Using the Toilet? N   In the past six months, have you accidently leaked urine? N   Do you have problems with loss of bowel control? N   Managing your Medications? N   Managing your Finances? N   Housekeeping or managing your Housekeeping? N     Patient Care Team: Dettinger, Fransisca Kaufmann, MD as PCP - General (Family Medicine) Troy Sine, MD as PCP - Cardiology (Cardiology) Danie Binder, MD (Inactive) as Attending Physician (Gastroenterology) Ralene Muskrat as Physician Assistant (Chiropractic Medicine) Druscilla Brownie, MD as Consulting Physician (Dermatology) Duke, Tami Lin, [place] as Physician Assistant (Cardiology)  Indicate any recent Medical Services you may have received from other than [facility] providers in the past year (date may be approximate).     Assessment:   This is a routine wellness examination for Patrick Bell.  Hearing/Vision screen Hearing Screening - Comments:: Wears hearing aids - from [place]:: Wears eyeglasses - up to date with annual eye exams with Dr August Albino in [place]  Dietary issues and exercise activities discussed: Current Exercise Habits: Home exercise routine, Type of exercise: walking;Other - see comments (yard work), Time (Minutes): 40, Frequency (Times/Week): 5, Weekly Exercise (Minutes/Week): 200, Intensity: Moderate, Exercise limited by: respiratory conditions(s)   Goals Addressed             This Visit's Progress    Exercise 150 minutes per week (moderate activity)   On track      Depression Screen    11/19/2021    2:10 PM 08/06/2021    2:24 PM 07/08/2021   10:26 AM 01/07/2021    9:30 AM 12/14/2020     7:59 AM 11/10/2020    9:31 AM 10/16/2020   10:45 AM  PHQ 2/9 Scores  PHQ - 2 Score 1 2 0 0 0 0 0  PHQ- 9 Score  '9  1 1  5    '$ Fall Risk    11/19/2021    2:08 PM 08/06/2021    2:24 PM 07/08/2021   10:26 AM 01/07/2021    9:23 AM 12/14/2020    7:58 AM  Fall Risk   Falls in the past year? 1 0 0 1 1  Number falls in past yr: 0   0 0  Injury with Fall? 0   0 0  Risk for fall due to : No Fall Risks   History of fall(s) History of fall(s)  Follow up Falls prevention discussed   Falls evaluation completed Falls evaluation completed    [place]:  Any stairs in or around the home? Yes  If so, are there any without handrails? No  Home free of loose throw rugs in walkways, pet beds, electrical cords, etc? Yes  Adequate lighting in your home to reduce risk of falls? Yes   ASSISTIVE DEVICES UTILIZED TO PREVENT FALLS:  Life alert? No  Use of a cane, walker or w/c? No  Grab bars in the bathroom? Yes  Shower chair or bench in shower? No  Elevated toilet seat or a handicapped toilet? Yes   TIMED UP AND GO:  Was the test performed? No . Telephonic visit  Cognitive Function:    01/23/2018    3:50 PM 01/18/2017    9:08 AM 01/19/2016    1:54 PM 03/11/2015  11:38 AM  MMSE - Mini Mental State Exam  Orientation to time '5 5 5 5  '$ Orientation to Place '5 5 5 5  '$ Registration '3 3 3 3  '$ Attention/ Calculation '5 3 5 5  '$ Recall '2 3 3 2  '$ Language- name 2 objects '2 2 2 2  '$ Language- repeat '1 1 1 1  '$ Language- follow 3 step command '3 3 3 3  '$ Language- read & follow direction '1 1 1 1  '$ Write a sentence '1 1 1 1  '$ Copy design '1 1 1 1  '$ Total score '29 28 30 29        '$ 11/19/2021    2:15 PM 10/16/2020   10:46 AM 01/29/2019    9:43 AM  6CIT Screen  What Year? 0 points 0 points 0 points  What month? 0 points 0 points 0 points  What time? 0 points 0 points 0 points  Count back from 20 0 points 0 points 0 points  Months in reverse 0 points 0 points 0 points  Repeat phrase 2  points 2 points 0 points  Total Score 2 points 2 points 0 points    Immunizations Immunization History  Administered Date(s) Administered   Fluad Quad(high Dose 65+) 02/13/2019, 01/06/2020, 02/05/2021   Influenza, High Dose Seasonal PF 01/23/2018   Influenza,inj,Quad PF,6+ Mos 01/14/2013, 03/14/2014, 03/11/2015, 01/19/2016, 01/18/2017   Moderna Sars-Covid-2 Vaccination 05/10/2019, 06/07/2019, 03/05/2020   Pneumococcal Conjugate-13 03/14/2014   Pneumococcal Polysaccharide-23 01/14/2013   Tdap 07/06/2020   Zoster Recombinat (Shingrix) 07/08/2021   Zoster, Live 06/14/2011    TDAP status: Up to date  Flu Vaccine status: Up to date  Pneumococcal vaccine status: Up to date  Covid-19 vaccine status: Completed vaccines  Qualifies for Shingles Vaccine? Yes   Zostavax completed Yes   Shingrix Completed?: No.    Education has been provided regarding the importance of this vaccine. Patient has been advised to call insurance company to determine out of pocket expense if they have not yet received this vaccine. Advised may also receive vaccine at local pharmacy or Health Dept. Verbalized acceptance and understanding.  Screening Tests Health Maintenance  Topic Date Due   COVID-19 Vaccine (4 - Moderna risk series) 04/30/2020   Zoster Vaccines- Shingrix (2 of 2) 09/02/2021   INFLUENZA VACCINE  11/02/2021   TETANUS/TDAP  07/07/2030   Pneumonia Vaccine 57+ Years old  Completed   HPV VACCINES  Aged Out    Health Maintenance  Health Maintenance Due  Topic Date Due   COVID-19 Vaccine (4 - Moderna risk series) 04/30/2020   Zoster Vaccines- Shingrix (2 of 2) 09/02/2021   INFLUENZA VACCINE  11/02/2021    Colorectal cancer screening: No longer required.   Lung Cancer Screening: (Low Dose CT Chest recommended if Age 62-80 years, 30 pack-year currently smoking OR have quit w/in 15years.) does not qualify.   Additional Screening:  Hepatitis C Screening: does not qualify  Vision  Screening: Recommended annual ophthalmology exams for early detection of glaucoma and other disorders of the eye. Is the patient up to date with their annual eye exam?  Yes  Who is the provider or what is the name of the office in which the patient attends annual eye exams? Dr August Albino in [place]  If pt is not established with a provider, would they like to be referred to a provider to establish care? No .   Dental Screening: Recommended annual dental exams for proper oral hygiene  Community Resource Referral / Chronic Care Management:  CRR required this visit?  No   CCM required this visit?  No      Plan:     I have personally reviewed and noted the following in the patient's chart:   Medical and social history Use of alcohol, tobacco or illicit drugs  Current medications and supplements including opioid prescriptions. Patient is not currently taking opioid prescriptions. Functional ability and status Nutritional status Physical activity Advanced directives List of other physicians Hospitalizations, surgeries, and ER visits in previous 12 months Vitals Screenings to include cognitive, depression, and falls Referrals and appointments  In addition, I have reviewed and discussed with patient certain preventive protocols, quality metrics, and best practice recommendations. A written personalized care plan for preventive services as well as general preventive health recommendations were provided to patient.     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

## 2021-11-19 NOTE — Patient Instructions (Signed)
[name] , Thank you for taking time to come for your Medicare Wellness Visit. I appreciate your ongoing commitment to your health goals. Please review the following plan we discussed and let me know if I can assist you in the future.   Screening recommendations/referrals: Colonoscopy: Done 06/09/2004 - no repeat due to age Recommended yearly ophthalmology/optometry visit for glaucoma screening and checkup Recommended yearly dental visit for hygiene and checkup  Vaccinations: Influenza vaccine: Done 02/05/2021 - Repeat annually Pneumococcal vaccine: Done  01/14/2013 & 03/14/2014     Tdap vaccine: Done 07/06/2020 - Repeat in 10 years  Shingles vaccine: Done 07/08/2021 - get second dose at next visit   Covid-19: Done  05/10/2019, 06/07/2019, 03/05/2020  Advanced directives: Please bring a copy of your health care power of attorney and living will to the office to be added to your chart at your convenience.   Conditions/risks identified: Aim for 30 minutes of exercise or brisk walking, 6-8 glasses of water, and 5 servings of fruits and vegetables each day.   Next appointment: Follow up in one year for your annual wellness visit.  Preventive Care 3 Years and Older, Male  Preventive care refers to lifestyle choices and visits with your health care provider that can promote health and wellness. What does preventive care include? A yearly physical exam. This is also called an annual well check. Dental exams once or twice a year. Routine eye exams. Ask your health care provider how often you should have your eyes checked. Personal lifestyle choices, including: Daily care of your teeth and gums. Regular physical activity. Eating a healthy diet. Avoiding tobacco and drug use. Limiting alcohol use. Practicing safe sex. Taking low doses of aspirin every day. Taking vitamin and mineral supplements as recommended by your health care provider. What happens during an annual well check? The services and  screenings done by your health care provider during your annual well check will depend on your age, overall health, lifestyle risk factors, and family history of disease. Counseling  Your health care provider may ask you questions about your: Alcohol use. Tobacco use. Drug use. Emotional well-being. Home and relationship well-being. Sexual activity. Eating habits. History of falls. Memory and ability to understand (cognition). Work and work Statistician. Screening  You may have the following tests or measurements: Height, weight, and BMI. Blood pressure. Lipid and cholesterol levels. These may be checked every 5 years, or more frequently if you are over 79 years old. Skin check. Lung cancer screening. You may have this screening every year starting at age 9 if you have a 30-pack-year history of smoking and currently smoke or have quit within the past 15 years. Fecal occult blood test (FOBT) of the stool. You may have this test every year starting at age 36. Flexible sigmoidoscopy or colonoscopy. You may have a sigmoidoscopy every 5 years or a colonoscopy every 10 years starting at age 33. Prostate cancer screening. Recommendations will vary depending on your family history and other risks. Hepatitis C blood test. Hepatitis B blood test. Sexually transmitted disease (STD) testing. Diabetes screening. This is done by checking your blood sugar (glucose) after you have not eaten for a while (fasting). You may have this done every 1-3 years. Abdominal aortic aneurysm (AAA) screening. You may need this if you are a current or former smoker. Osteoporosis. You may be screened starting at age 66 if you are at high risk. Talk with your health care provider about your test results, treatment options, and if  necessary, the need for more tests. Vaccines  Your health care provider may recommend certain vaccines, such as: Influenza vaccine. This is recommended every year. Tetanus, diphtheria, and  acellular pertussis (Tdap, Td) vaccine. You may need a Td booster every 10 years. Zoster vaccine. You may need this after age 2. Pneumococcal 13-valent conjugate (PCV13) vaccine. One dose is recommended after age 65. Pneumococcal polysaccharide (PPSV23) vaccine. One dose is recommended after age 71. Talk to your health care provider about which screenings and vaccines you need and how often you need them. This information is not intended to replace advice given to you by your health care provider. Make sure you discuss any questions you have with your health care provider. Document Released: 04/17/2015 Document Revised: 12/09/2015 Document Reviewed: 01/20/2015 Elsevier Interactive Patient Education  2017 [place] Prevention in the Home Falls can cause injuries. They can happen to people of all ages. There are many things you can do to make your home safe and to help prevent falls. What can I do on the outside of my home? Regularly fix the edges of walkways and driveways and fix any cracks. Remove anything that might make you trip as you walk through a door, such as a raised step or threshold. Trim any bushes or trees on the path to your home. Use bright outdoor lighting. Clear any walking paths of anything that might make someone trip, such as rocks or tools. Regularly check to see if handrails are loose or broken. Make sure that both sides of any steps have handrails. Any raised decks and porches should have guardrails on the edges. Have any leaves, snow, or ice cleared regularly. Use sand or salt on walking paths during winter. Clean up any spills in your garage right away. This includes oil or grease spills. What can I do in the bathroom? Use night lights. Install grab bars by the toilet and in the tub and shower. Do not use towel bars as grab bars. Use non-skid mats or decals in the tub or shower. If you need to sit down in the shower, use a plastic, non-slip stool. Keep  the floor dry. Clean up any water that spills on the floor as soon as it happens. Remove soap buildup in the tub or shower regularly. Attach bath mats securely with double-sided non-slip rug tape. Do not have throw rugs and other things on the floor that can make you trip. What can I do in the bedroom? Use night lights. Make sure that you have a light by your bed that is easy to reach. Do not use any sheets or blankets that are too big for your bed. They should not hang down onto the floor. Have a firm chair that has side arms. You can use this for support while you get dressed. Do not have throw rugs and other things on the floor that can make you trip. What can I do in the kitchen? Clean up any spills right away. Avoid walking on wet floors. Keep items that you use a lot in easy-to-reach places. If you need to reach something above you, use a strong step stool that has a grab bar. Keep electrical cords out of the way. Do not use floor polish or wax that makes floors slippery. If you must use wax, use non-skid floor wax. Do not have throw rugs and other things on the floor that can make you trip. What can I do with my stairs? Do not leave any items  on the stairs. Make sure that there are handrails on both sides of the stairs and use them. Fix handrails that are broken or loose. Make sure that handrails are as long as the stairways. Check any carpeting to make sure that it is firmly attached to the stairs. Fix any carpet that is loose or worn. Avoid having throw rugs at the top or bottom of the stairs. If you do have throw rugs, attach them to the floor with carpet tape. Make sure that you have a light switch at the top of the stairs and the bottom of the stairs. If you do not have them, ask someone to add them for you. What else can I do to help prevent falls? Wear shoes that: Do not have high heels. Have rubber bottoms. Are comfortable and fit you well. Are closed at the toe. Do not  wear sandals. If you use a stepladder: Make sure that it is fully opened. Do not climb a closed stepladder. Make sure that both sides of the stepladder are locked into place. Ask someone to hold it for you, if possible. Clearly mark and make sure that you can see: Any grab bars or handrails. First and last steps. Where the edge of each step is. Use tools that help you move around (mobility aids) if they are needed. These include: Canes. Walkers. Scooters. Crutches. Turn on the lights when you go into a dark area. Replace any light bulbs as soon as they burn out. Set up your furniture so you have a clear path. Avoid moving your furniture around. If any of your floors are uneven, fix them. If there are any pets around you, be aware of where they are. Review your medicines with your doctor. Some medicines can make you feel dizzy. This can increase your chance of falling. Ask your doctor what other things that you can do to help prevent falls. This information is not intended to replace advice given to you by your health care provider. Make sure you discuss any questions you have with your health care provider. Document Released: 01/15/2009 Document Revised: 08/27/2015 Document Reviewed: 04/25/2014 Elsevier Interactive Patient Education  2017 [employer].

## 2021-11-20 DIAGNOSIS — G4733 Obstructive sleep apnea (adult) (pediatric): Secondary | ICD-10-CM | POA: Diagnosis not present

## 2021-11-20 DIAGNOSIS — R0683 Snoring: Secondary | ICD-10-CM | POA: Diagnosis not present

## 2021-11-25 DIAGNOSIS — B351 Tinea unguium: Secondary | ICD-10-CM | POA: Diagnosis not present

## 2021-11-25 DIAGNOSIS — L600 Ingrowing nail: Secondary | ICD-10-CM | POA: Diagnosis not present

## 2021-11-25 DIAGNOSIS — M79672 Pain in left foot: Secondary | ICD-10-CM | POA: Diagnosis not present

## 2021-11-25 DIAGNOSIS — M79671 Pain in right foot: Secondary | ICD-10-CM | POA: Diagnosis not present

## 2021-12-01 ENCOUNTER — Ambulatory Visit: Payer: Medicare Other | Admitting: Cardiovascular Disease

## 2021-12-02 ENCOUNTER — Ambulatory Visit (HOSPITAL_BASED_OUTPATIENT_CLINIC_OR_DEPARTMENT_OTHER): Payer: Medicare Other | Admitting: Pulmonary Disease

## 2021-12-05 ENCOUNTER — Ambulatory Visit (HOSPITAL_BASED_OUTPATIENT_CLINIC_OR_DEPARTMENT_OTHER): Payer: Medicare Other | Attending: Nurse Practitioner | Admitting: Pulmonary Disease

## 2021-12-05 DIAGNOSIS — G4733 Obstructive sleep apnea (adult) (pediatric): Secondary | ICD-10-CM | POA: Diagnosis not present

## 2021-12-07 DIAGNOSIS — G4733 Obstructive sleep apnea (adult) (pediatric): Secondary | ICD-10-CM

## 2021-12-07 NOTE — Procedures (Signed)
    Patient Name: Patrick, Bell Date: 12/05/2021 Gender: Male D.O.B: [birth year] Age (years): 67 Referring Provider: Clayton Bibles NP Height (inches): 9 Interpreting Physician: Chesley Mires MD, ABSM Weight (lbs): 185 RPSGT: Patrick Bell, Patrick Bell BMI: 27 MRN: [id number] Neck Size: 16.00  CLINICAL INFORMATION The patient has a history of obstructive sleep apnea.  He has been using CPAP with relatively high pressure settings with a full face mask, and continues to have elevated AHI of his CPAP download.  He is referred for a CPAP/BiPAP titration to treat sleep apnea.  Date of Split Night 01/26/21:  AHI 25.3, SpO2 low 82%.  SLEEP STUDY TECHNIQUE As per the AASM Manual for the Scoring of Sleep and Associated Events v2.3 (April 2016) with a hypopnea requiring 4% desaturations.  The channels recorded and monitored were frontal, central and occipital EEG, electrooculogram (EOG), submentalis EMG (chin), nasal and oral airflow, thoracic and abdominal wall motion, anterior tibialis EMG, snore microphone, electrocardiogram, and pulse oximetry. Bilevel positive airway pressure (BPAP) was initiated at the beginning of the study and titrated to treat sleep-disordered breathing.  MEDICATIONS Medications self-administered by patient taken the night of the study : N/A  RESPIRATORY PARAMETERS Optimal IPAP Pressure (cm): 11 AHI at Optimal Pressure (/hr) 3.1 Optimal EPAP Pressure (cm): 11   Overall Minimal O2 (%): 89.00 Minimal O2 at Optimal Pressure (%): 90.0  He had persistent respiratory events with high pressure settings on CPAP and Bipap while wearing a full face mask.  He was switched to a nasal mask with significant improvement in the frequency of respiratory events, and he was able to tolerate a much lower CPAP pressure setting.  SLEEP ARCHITECTURE Start Time: 10:07:11 PM Stop Time: 5:03:09 AM Total Time (min): 416 Total Sleep Time (min): 251.4 Sleep Latency (min): 10.0 Sleep  Efficiency (%): 60.4 REM Latency (min): 61.0 WASO (min): 154.5 Stage N1 (%): 21.65 Stage N2 (%): 61.25 Stage N3 (%): 4.57 Stage R (%): 12.5 Supine (%): 41.76 Arousal Index (/hr): 18.6   CARDIAC DATA The 2 lead EKG demonstrated sinus rhythm. The mean heart rate was 61.90 beats per minute. Other EKG findings include: PVCs.  LEG MOVEMENT DATA The total Periodic Limb Movements of Sleep (PLMS) were 103. The PLMS index was 24.58. A PLMS index of <15 is considered normal in adults.  IMPRESSIONS - He did best with CPAP 11 cm H2O in combination with a nasal CPAP mask.  He likely developed worsening obstruction from higher PAP pressures with full face mask due to retropharygneal collapse with this combination. - He did not require supplemental oxygen during this study.  DIAGNOSIS - Obstructive Sleep Apnea (G47.33)  RECOMMENDATIONS - Trial of CPAP therapy on 11 cm H2O with a Medium size Resmed Nasal Cradle AirFit N30 mask and heated humidification. - Avoid alcohol, sedatives and other CNS depressants that may worsen sleep apnea and disrupt normal sleep architecture. - Sleep hygiene should be reviewed to assess factors that may improve sleep quality. - Weight management and regular exercise should be initiated or continued.  [Electronically signed] 12/07/2021 04:11 PM  Chesley Mires MD, ABSM Diplomate, American Board of Sleep Medicine NPI: [id number]  [place] PH: [phone]   FX: [phone] [place]

## 2021-12-07 NOTE — Progress Notes (Deleted)
Cardiology Office Note:    Date:  12/07/2021   ID:  Patrick Bell, DOB [birth year], MRN [id number]  PCP:  Patrick Bell, Patrick Kaufmann, Bell   [place] Providers Cardiologist:  Patrick Majestic, Bell Cardiology APP:  Patrick Bell, [place] { Referring Bell: Patrick Bell, Patrick Kaufmann, Bell   No chief complaint on file. ***  History of Present Illness:    Patrick Bell is a 81 y.o. male with a hx of severe mitral regurgitation s/p mitral valve repair and s/p aortic dissection repair, PAF, hypertension, OSA, asthma, COPD, GERD, and hyperlipidemia.  Echo 11/2019 with normal EF 60-65%, and normal RV function, severe MR with moderate holosystolic prolapse of the middle scallop of the posterior leaflet of the mitral valve. TEE confirmed mitral valve prolapse with flail segment involving a portion of the posterior leaflet and severe MR. CT surgery was consulted and he underwent right and left heart cath that showed normal coronary arteries. He underwent mitral valve repair 03/11/20 with Dr. Roxy Bell, which was complicated by an acute type A aortic dissection requiring graft repair of his aorta and resuspension of his native aortic valve. Hospital course further complicated by pericardial effusion requiring a window, Afib treated with amiodarone and coumadin, anemia requiring transfusion, AKI, hypotension, and a new diagnosis of hypothyroidism. He converted back to sinus rhythm after pericardial window. He was discharged to [facility] 03/31/20, discharged home 04/07/20. Follow up CT imaging with CT surgery showed persistent aortic dissection flap through the arch, descending thoracic, abdominal aorta, bilateral iliac arteries, and left common femoral artery.  The dissection flap involves bilateral subclavian arteries, celiac, SMA, and bilateral renal arteries.  Given chronic dissection, Dr. Claiborne Bell increased metoprolol to 50 mg daily. He has a history of BP differential and had follow up CT imaging 09/2020 that showed stable  dissection as above with flap involving the left subclavian artery superior mesenteric and both renal arteries. He was last seen in clinic by Dr. Claiborne Bell 10/06/20. Repeat CT imaging 03/2021 with stable chronic dissection. Pt has a history of self-adjusting or self-discontinuing medications (synthroid, coumadin, BB).  In response to pt self-discontinuing metoprolol: Dr. Claiborne Bell noted  I again have strongly urged that he not adjust his own medications.  With his extensive aortic dissection he is at significant risk for dissection progression off beta-blocker therapy.  I have resume metoprolol succinate 50 mg daily.    He called our office with reports of dizziness for the last 2 weeks. He stopped his metoprolol but dizziness has persisted for another three days. He was significantly orthostatic 140 --> 108. I felt he was dehydrated and had diarrhea monthly.  Difficult situation as he needs BP control for chronic thoracic and descending aortic dissection to include BB therapy. I opted to stop amlodipine 2.5 mg and restart 50 mg toprol. Labs were largely unremarkable. I also referred to ENT for dizziness. I encouraged CPAP use. If no improvement with these measures, consider placing heart monitor.   He saw Dr. Caffie Bell 05/19/2021 with a recent echo showing LVEF 60 to 65% with mild MR and AI.  CTA chest abdomen pelvis 03/05/2021 showed stable ascending aortic aneurysm repair and residual dissection flap visible just beyond the repair extending through the aortic arch and down into the abdominal aorta into the bilateral iliac arteries and left common femoral artery.  The dissection flap extended into the SMA and bilateral renal arteries.  Optimal blood pressure was again stressed.  He was last seen by Dr. Claiborne Bell 09/07/2021.  His  blood pressure improved and he is now back on amlodipine 5 mg, metoprolol succinate 50 mg daily.  Continued on 10 mg simvastatin anticoagulated with warfarin.  He presents today for 52-month follow-up.      Chronic thoracic and descending aortic dissection Type A aortic dissection s/p graft repair It is imperative that he stay on beta-blocker therapy with good blood pressure control   Hypertension Orthostatic hypotension Dizziness and hypotension felt related to dehydration. This has improved and is now back on amlodipine and BB therapy. No further dizziness.    MVP s/p mitral valve repair - stable on last echo   OSA on CPAP - ?compliance   PAF Chronic anticoagulation - no palpitations, no bleeding issues          Past Medical History:  Diagnosis Date   Acute thoracic aortic dissection (HCC) 03/11/2020   Type A   Allergy    Asthma    Cataract    Cataract    Diverticulitis    Dyspnea    GERD (gastroesophageal reflux disease)    Heart murmur 12/2019   Hyperlipidemia    Hypertension    Inguinal hernia bilateral, non-recurrent    S/P aortic dissection repair 03/11/2020   supracoronary straight graft repair with resuspension of native aortic valve and open distal anastomosis for intraoperative acute type A aortic dissection    S/P mitral valve repair 03/11/2020   Complex valvuloplasty including artificial Gore-tex neochord placement x 4, suture plication of posterior leaflet and posteromedial commissure and 32 mm Sorin Memo 4D ring annuloplasty   Sleep apnea     Past Surgical History:  Procedure Laterality Date   bilateral inguinal hernia     CARDIAC CATHETERIZATION Bilateral 02/21/2020   CHEST TUBE INSERTION N/A 03/22/2020   Procedure: CHEST TUBE INSERTION OF 28 BLAKE DRAIN.;  Surgeon: Patrick Bell;  [facility];  Service: Thoracic;  Laterality: N/A;   COLONOSCOPY  06/09/2004   NKKX:FGHWEXHcolon diverticulosis, otherwise normal colonoscopy   EYE SURGERY  08/2014   HERNIA REPAIR     MITRAL VALVE REPAIR N/A 03/11/2020   Procedure: MITRAL VALVE REPAIR (MVR) USING MEMO 4D SIZE 32 RING;  Surgeon: Patrick Alberts Bell;  [facility];  Service: Open Heart Surgery;  Laterality: N/A;   PALATE / UVULA BIOPSY / EXCISION     PERICARDIAL WINDOW N/A 03/22/2020   Procedure: SUBXYPHOID POST-OP PERICARDIAL FLUID DRAINAGE WITH CHEST TUBE INSERTION.;  Surgeon: Patrick Bell;  [place] [facility]  Service: Thoracic;  Laterality: N/A;   REPAIR OF ACUTE ASCENDING THORACIC AORTIC DISSECTION N/A 03/11/2020   Procedure: REPAIR OF ACUTE ASCENDING THORACIC AORTIC DISSECTION USING A HEMASHIELD PLATINUM 26MM STRAIGHT GRAFT AND A HEASHIELD PLATINUM 28X10MM SINGLE ARM GRAFT;  Surgeon: Patrick Alberts Bell;  [facility]  Service: Open Heart Surgery;  Laterality: N/A;   RIGHT/LEFT HEART CATH AND CORONARY ANGIOGRAPHY N/A 02/21/2020   Procedure: RIGHT/LEFT HEART CATH AND CORONARY ANGIOGRAPHY;  Surgeon: KTroy Sine Bell;  [facility];  Service: Cardiovascular;  Laterality: N/A;   TEE WITHOUT CARDIOVERSION N/A 12/19/2019   Procedure: TRANSESOPHAGEAL ECHOCARDIOGRAM (TEE);  Surgeon: CBuford Dresser Bell;  [facility];  Service: Cardiovascular;  Laterality: N/A;   TEE WITHOUT CARDIOVERSION N/A 03/11/2020   Procedure: TRANSESOPHAGEAL ECHOCARDIOGRAM (TEE);  Surgeon: Patrick Alberts Bell;  [facility]  Service: Open Heart Surgery;  Laterality: N/A;   TEE WITHOUT CARDIOVERSION N/A 03/22/2020   Procedure: TRANSESOPHAGEAL ECHOCARDIOGRAM (TEE);  Surgeon: Patrick Isaac, Bell;  Location: [facility] OR;  Service: Thoracic;  Laterality: N/A;   TONSILLECTOMY     VASECTOMY      Current Medications: No outpatient medications have been marked as taking for the 12/08/21 encounter (Appointment) with Patrick Bell, [place].     Allergies:   Patient has no known allergies.   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    Comment: truck driving  Tobacco Use   Smoking status: Former    Packs/day: 4.00    Years: 25.00    Total pack years: 100.00    Types: Cigarettes    Quit date: 06/23/1982    Years since quitting: 39.4   Smokeless tobacco: Never   Tobacco comments:    pt was a truck Land Use: Never used  Substance and Sexual Activity   Alcohol use: Yes    Alcohol/week: 5.0 standard drinks of alcohol    Types: 5 Shots of liquor per week    Comment: brandy a few times a month, beer once a month   Drug use: No   Sexual activity: Yes    Birth control/protection: Surgical  Other Topics Concern   Not on file  Social History Narrative   Not on file   Social Determinants of Health   Financial Resource Strain: Low Risk  (11/19/2021)   Overall Financial Resource Strain (CARDIA)    Difficulty of Paying Living Expenses: Not hard at all  Food Insecurity: No Food Insecurity (11/19/2021)   Hunger Vital Sign    Worried About Running Out of Food in the Last Year: Never true    Ran Out of Food in the Last Year: Never true  Transportation Needs: No Transportation Needs (11/19/2021)   PRAPARE - Hydrologist (Medical): No    Lack of Transportation (Non-Medical): No  Physical Activity: Sufficiently Active (11/19/2021)   Exercise Vital Sign    Days of Exercise per Week: 5 days    Minutes of Exercise per Session: 40 min  Stress: No Stress Concern Present (11/19/2021)   [place]    Feeling of Stress : Not at all  Social Connections: [place] (11/19/2021)   Social Connection and Isolation Panel [NHANES]    Frequency of Communication with Friends and Family: More than three times a week    Frequency of Social Gatherings with Friends and Family: More than three times a week    Attends Religious Services: More than 4 times per year    Active Member of [employer] or  Organizations: Yes    Attends Music therapist: More than 4 times per year    Marital Status: Married     Family History: The patient's ***family history includes Arthritis in his brother, father, and sister; Dementia (age of onset: 67) in his mother; Heart disease in his father and mother; Hip fracture (age of onset: 64) in his mother; Osteoporosis in his mother. There is no history of Colon cancer.  ROS:   Please see the history of present illness.    *** All other systems reviewed and are negative.  EKGs/Labs/Other Studies Reviewed:    The  following studies were reviewed today:  Echo 04/2021:  1. Left ventricular ejection fraction, by estimation, is 60 to 65%. Left  ventricular ejection fraction by 3D volume is 63 %. The left ventricle has  normal function. The left ventricle has no regional wall motion  abnormalities. There is mild asymmetric  left ventricular hypertrophy. Left ventricular diastolic parameters are  consistent with Grade I diastolic dysfunction (impaired relaxation).   2. Right ventricular systolic function is moderately reduced. The right  ventricular size is normal. There is normal pulmonary artery systolic  pressure.   3. Left atrial size was moderately dilated.   4. Right atrial size was mildly dilated.   5. The mitral valve has been repaired/replaced. Mild mitral valve  regurgitation.   6. The aortic valve is grossly normal. Aortic valve regurgitation is  mild.   EKG:  EKG is *** ordered today.  The ekg ordered today demonstrates ***  Recent Labs: 07/08/2021: ALT 10; BUN 16; Creatinine, Ser 1.12; Hemoglobin 14.0; Platelets 176; Potassium 4.3; Sodium 144; TSH 0.880  Recent Lipid Panel    Component Value Date/Time   CHOL 177 07/08/2021 1104   CHOL 168 06/18/2012 1031   TRIG 65 07/08/2021 1104   TRIG 101 01/14/2013 1218   TRIG 102 06/18/2012 1031   HDL 73 07/08/2021 1104   HDL 65 01/14/2013 1218   HDL 62 06/18/2012 1031   CHOLHDL 2.4  07/08/2021 1104   LDLCALC 92 07/08/2021 1104   LDLCALC 99 01/14/2013 1218   LDLCALC 86 06/18/2012 1031     Risk Assessment/Calculations:   {Does this patient have ATRIAL FIBRILLATION?:[phone]}  No BP recorded.  {Refresh Note OR Click here to enter BP  :1}***         Physical Exam:    VS:  There were no vitals taken for this visit.    Wt Readings from Last 3 Encounters:  12/05/21 185 lb (83.9 kg)  11/19/21 185 lb (83.9 kg)  11/08/21 185 lb 3.2 oz (84 kg)     GEN: *** Well nourished, well developed in no acute distress HEENT: Normal NECK: No JVD; No carotid bruits LYMPHATICS: No lymphadenopathy CARDIAC: ***RRR, no murmurs, rubs, gallops RESPIRATORY:  Clear to auscultation without rales, wheezing or rhonchi  ABDOMEN: Soft, non-tender, non-distended MUSCULOSKELETAL:  No edema; No deformity  SKIN: Warm and dry NEUROLOGIC:  Alert and oriented x 3 PSYCHIATRIC:  Normal affect   ASSESSMENT:    No diagnosis found. PLAN:    In order of problems listed above:  ***      {Are you ordering a CV Procedure (e.g. stress test, cath, DCCV, TEE, etc)?   Press F2        :[id number]}    Medication Adjustments/Labs and Tests Ordered: Current medicines are reviewed at length with the patient today.  Concerns regarding medicines are outlined above.  No orders of the defined types were placed in this encounter.  No orders of the defined types were placed in this encounter.   There are no Patient Instructions on file for this visit.   Signed, Patrick Bell, [place]  12/07/2021 7:47 PM    [facility]

## 2021-12-08 ENCOUNTER — Encounter: Payer: Self-pay | Admitting: Family Medicine

## 2021-12-08 ENCOUNTER — Telehealth (INDEPENDENT_AMBULATORY_CARE_PROVIDER_SITE_OTHER): Payer: Medicare Other | Admitting: Family Medicine

## 2021-12-08 ENCOUNTER — Ambulatory Visit: Payer: Medicare Other | Admitting: Physician Assistant

## 2021-12-08 ENCOUNTER — Telehealth: Payer: Self-pay | Admitting: Nurse Practitioner

## 2021-12-08 ENCOUNTER — Ambulatory Visit: Payer: Medicare Other

## 2021-12-08 DIAGNOSIS — J441 Chronic obstructive pulmonary disease with (acute) exacerbation: Secondary | ICD-10-CM | POA: Diagnosis not present

## 2021-12-08 DIAGNOSIS — U071 COVID-19: Secondary | ICD-10-CM

## 2021-12-08 MED ORDER — PREDNISONE 20 MG PO TABS: ORAL_TABLET | ORAL | 0 refills | Status: DC

## 2021-12-08 MED ORDER — NIRMATRELVIR/RITONAVIR (PAXLOVID)TABLET: 3.0000 | ORAL_TABLET | Freq: Two times a day (BID) | ORAL | 0 refills | Status: AC

## 2021-12-08 NOTE — Progress Notes (Signed)
Virtual Visit via telephone Note  I connected with Patrick Bell on 12/08/21 at 1626 by telephone and verified that I am speaking with the correct person using two identifiers. Patrick Bell is currently located at home and patient are currently with her during visit. The provider, Fransisca Kaufmann Neenah Canter, MD is located in their office at time of visit.  Call ended at 1634  I discussed the limitations, risks, security and privacy concerns of performing an evaluation and management service by telephone and the availability of in person appointments. I also discussed with the patient that there may be a patient responsible charge related to this service. The patient expressed understanding and agreed to proceed.  140/77, 73 hr, 99.1 History and Present Illness: Patient started with congestion and has used nebulizer and flonase.  He did a home tests for covid and were positive. Fever or 99.2.  he is having shortness of breath and wheezing.  He is using tylenol and coricidin and is not feeling so well he has pain in chest and back with coughing.   1. COVID-19 virus infection   2. COPD exacerbation [place])     Outpatient Encounter Medications as of 12/08/2021  Medication Sig   nirmatrelvir/ritonavir EUA (PAXLOVID) 20 x 150 MG & 10 x '100MG'$  TABS Take 3 tablets by mouth 2 (two) times daily for 5 days. (Take nirmatrelvir 150 mg two tablets twice daily for 5 days and ritonavir 100 mg one tablet twice daily for 5 days) Patient GFR is 66   predniSONE (DELTASONE) 20 MG tablet 2 po at same time daily for 5 days   acetaminophen (TYLENOL) 325 MG tablet Take 1-2 tablets (325-650 mg total) by mouth every 4 (four) hours as needed for mild pain.   albuterol (VENTOLIN HFA) 108 (90 Base) MCG/ACT inhaler INHALE 2 PUFFS EVERY 6 HOURS AS NEEDED FOR WHEEZING OR SHORTNESS OF BREATH   amLODipine (NORVASC) 5 MG tablet Take 1 tablet (5 mg total) by mouth daily.   budesonide-formoterol (SYMBICORT) 80-4.5 MCG/ACT inhaler  Inhale 2 puffs into the lungs in the morning and at bedtime.   Elastic Bandages & Supports (ABDOMINAL BINDER/ELASTIC LARGE) MISC 1 each by Does not apply route daily.   guaiFENesin (MUCINEX) 600 MG 12 hr tablet Take 600 mg by mouth every 4 (four) hours as needed for cough or to loosen phlegm.   ipratropium (ATROVENT) 0.03 % nasal spray SMARTSIG:2 Puff(s) Both Nares 2-3 Times Daily   levothyroxine (SYNTHROID) 50 MCG tablet Take 1 tablet (50 mcg total) by mouth daily.   metoprolol succinate (TOPROL-XL) 50 MG 24 hr tablet Take 1 tablet by mouth at night   montelukast (SINGULAIR) 10 MG tablet TAKE 1 TABLET DAILY   Olopatadine HCl 0.2 % SOLN Place 1 drop into both eyes daily as needed (allergies).   pantoprazole (PROTONIX) 40 MG tablet TAKE 1 TABLET DAILY   simvastatin (ZOCOR) 10 MG tablet TAKE ONE TABLET AT BEDTIME   terbinafine (LAMISIL) 250 MG tablet Take 250 mg by mouth daily.   warfarin (COUMADIN) 5 MG tablet TAKE ONE TABLET ONCE DAILY AT 4PM   No facility-administered encounter medications on file as of 12/08/2021.    Review of Systems  Constitutional:  Positive for fever. Negative for chills.  HENT:  Positive for congestion, postnasal drip, rhinorrhea, sinus pressure and sneezing. Negative for ear discharge, ear pain, sore throat and voice change.   Eyes:  Negative for pain, discharge, redness and visual disturbance.  Respiratory:  Positive for cough, shortness  of breath and wheezing.   Cardiovascular:  Negative for chest pain and leg swelling.  Musculoskeletal:  Negative for gait problem.  Skin:  Negative for rash.  All other systems reviewed and are negative.   Observations/Objective: Patient sounds comfortable and in no acute distress  Assessment and Plan: Problem List Items Addressed This Visit   None Visit Diagnoses     COVID-19 virus infection    -  Primary   Relevant Medications   predniSONE (DELTASONE) 20 MG tablet   nirmatrelvir/ritonavir EUA (PAXLOVID) 20 x 150 MG & 10  x '100MG'$  TABS   COPD exacerbation (HCC)       Relevant Medications   predniSONE (DELTASONE) 20 MG tablet   nirmatrelvir/ritonavir EUA (PAXLOVID) 20 x 150 MG & 10 x '100MG'$  TABS       We will send some prednisone and Paxlovid and continue his albuterol treatments and if anything worsens call us or go to the emergency department.  With his home test positive and his symptoms it is highly suspicious that he does actually have the COVID so we will go ahead and treat like COVID. Follow up plan: Return if symptoms worsen or fail to improve.     I discussed the assessment and treatment plan with the patient. The patient was provided an opportunity to ask questions and all were answered. The patient agreed with the plan and demonstrated an understanding of the instructions.   The patient was advised to call back or seek an in-person evaluation if the symptoms worsen or if the condition fails to improve as anticipated.  The above assessment and management plan was discussed with the patient. The patient verbalized understanding of and has agreed to the management plan. Patient is aware to call the clinic if symptoms persist or worsen. Patient is aware when to return to the clinic for a follow-up visit. Patient educated on when it is appropriate to go to the emergency department.    I provided 8 minutes of non-face-to-face time during this encounter.    Worthy Rancher, MD

## 2021-12-09 ENCOUNTER — Other Ambulatory Visit: Payer: Self-pay | Admitting: *Deleted

## 2021-12-09 ENCOUNTER — Telehealth: Payer: Self-pay | Admitting: *Deleted

## 2021-12-09 DIAGNOSIS — G4733 Obstructive sleep apnea (adult) (pediatric): Secondary | ICD-10-CM

## 2021-12-09 NOTE — Telephone Encounter (Signed)
Patrick Bell, this is just and FYI:  Called and spoke with patient regarding results per Merita Norton NP, he stated that he tested positive for covid yesterday.  He contacted his pcp and is on an antiviral.  Nothing further needed.

## 2021-12-09 NOTE — Telephone Encounter (Signed)
Called and spoke with patient, provided results/recommendations per Roxan Diesel NP.  He verbalized understanding.  Orders sent to [facility].  Nothing further needed.

## 2021-12-09 NOTE — Telephone Encounter (Signed)
Hope he feels better soon. Thank you!

## 2021-12-10 IMAGING — CT CT ANGIO CHEST
1 of 4 series · 16 of 32 positions shown · IV contrast (iopamidol)
Comparison: 03/02/2018 CT abdomen pelvis

CLINICAL DATA: Abdominal pain, concern for aortic dissection,
preoperative evaluation mitral valve replacement. History of severe
mitral valve regurgitation. Remote bilateral inguinal hernia
repairs.

EXAM:
CT ANGIOGRAPHY CHEST, ABDOMEN AND PELVIS
TECHNIQUE: Multidetector CT imaging through the chest, abdomen and pelvis was
performed using the standard protocol during bolus administration of
intravenous contrast. Multiplanar reconstructed images and MIPs were
obtained and reviewed to evaluate the vascular anatomy.
CONTRAST:  100mL NUEYL9-WFF IOPAMIDOL (NUEYL9-WFF) INJECTION 61%

[Series 6: pre stent angio · axial · non-contrast · 0.74mm/px · z∈[-668,-83]mm · 16 of 663 slices shown]
[im 39/663  lung]
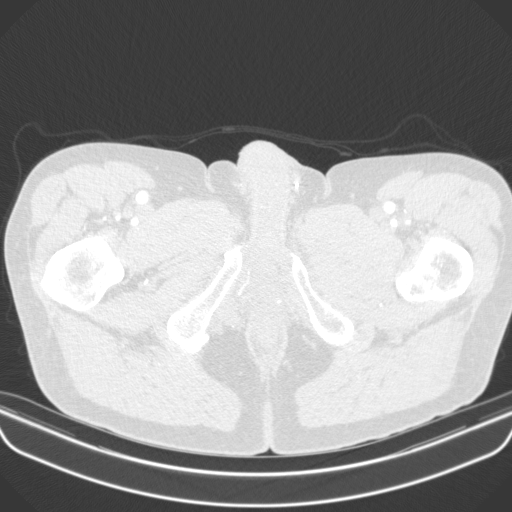
[im 78/663  soft-tissue]
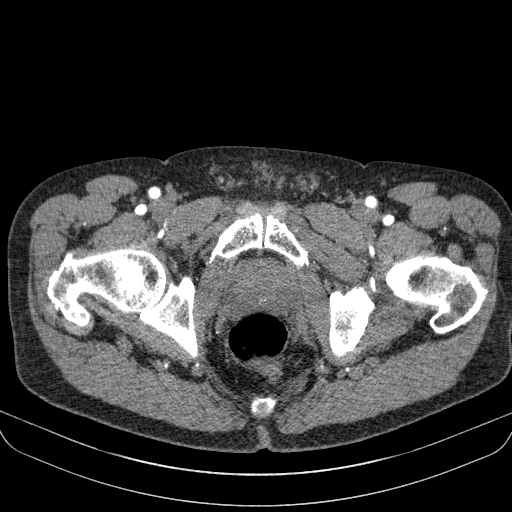
[im 117/663  lung]
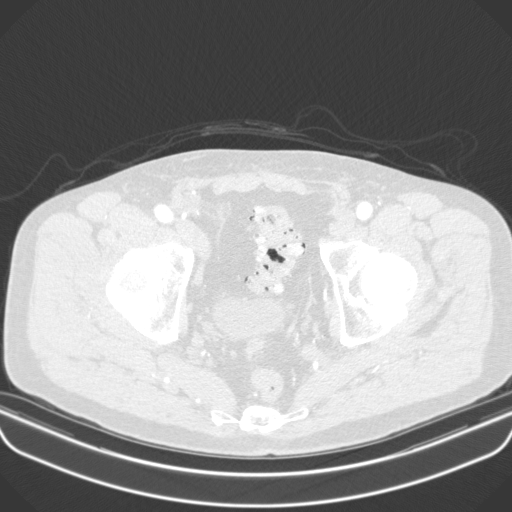
[im 156/663  soft-tissue]
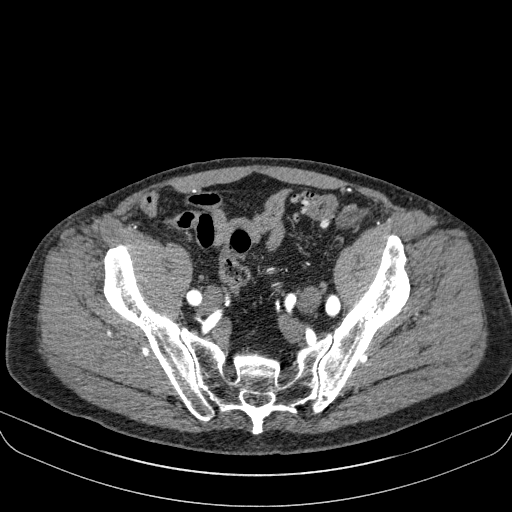
[im 195/663  lung]
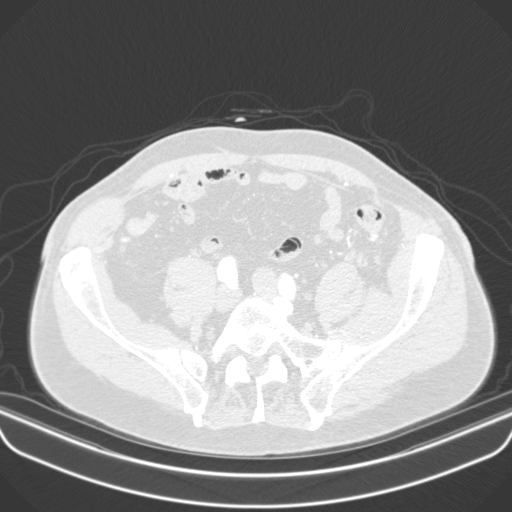
[im 234/663  soft-tissue]
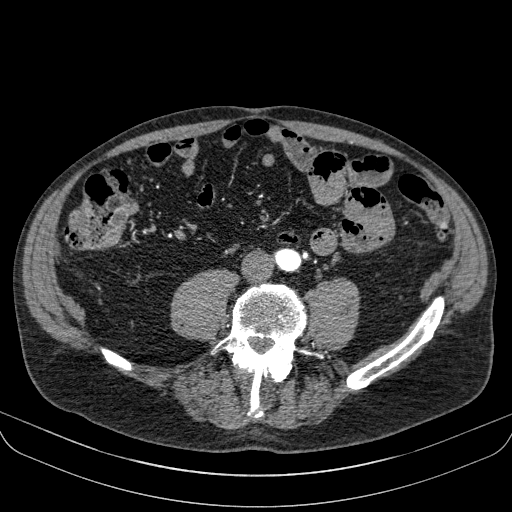
[im 273/663  lung]
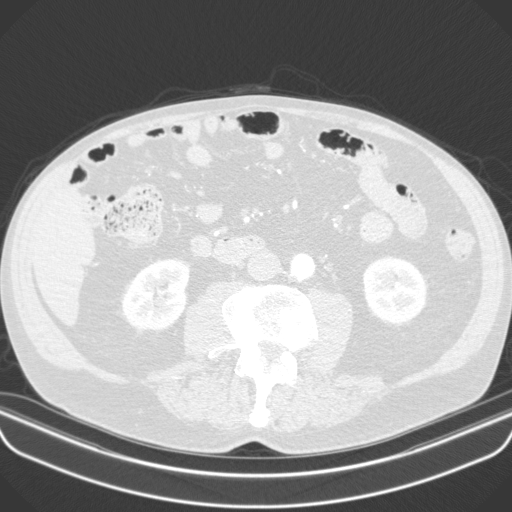
[im 312/663  soft-tissue]
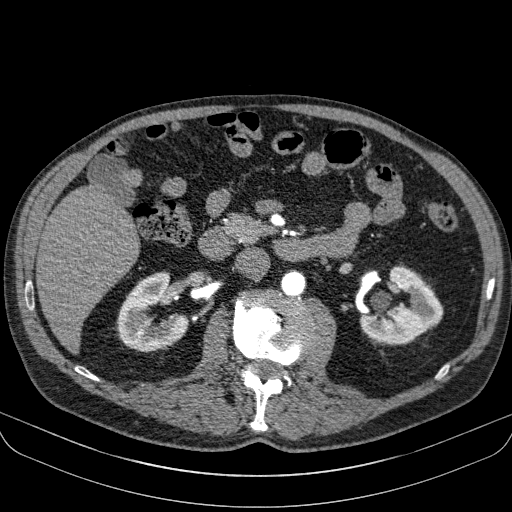
[im 351/663  lung]
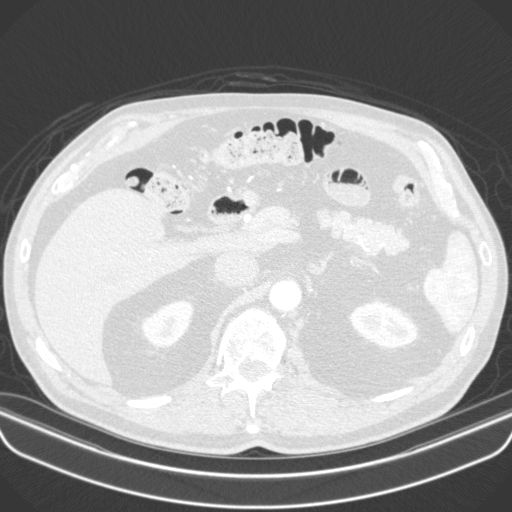
[im 390/663  soft-tissue]
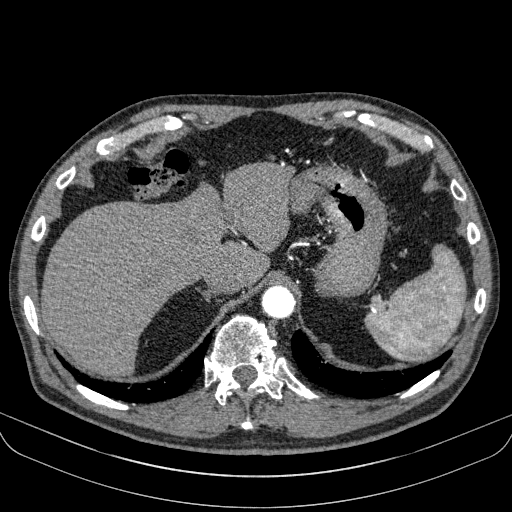
[im 429/663  lung]
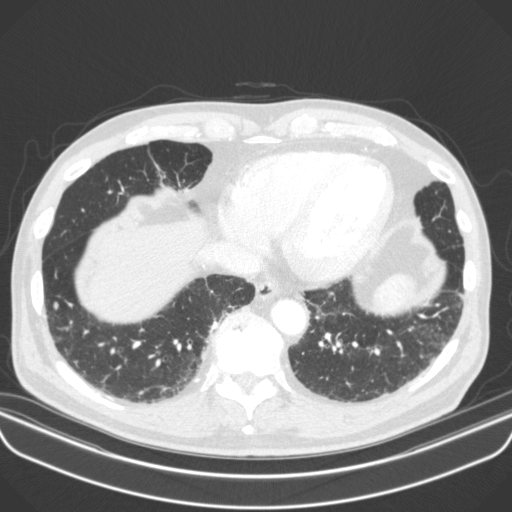
[im 468/663  soft-tissue]
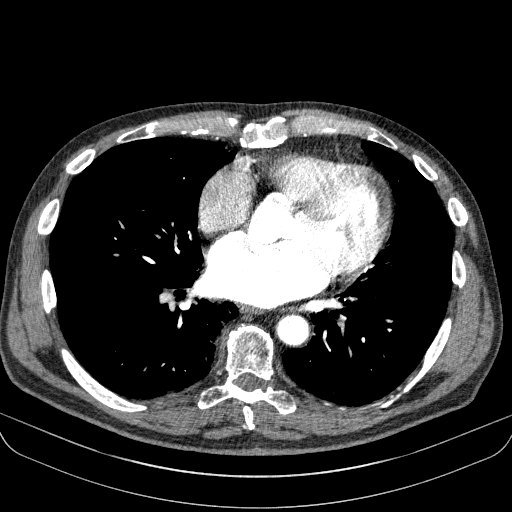
[im 507/663  lung]
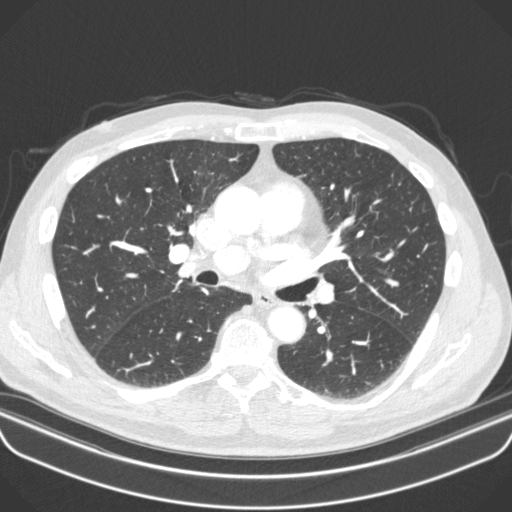
[im 546/663  soft-tissue]
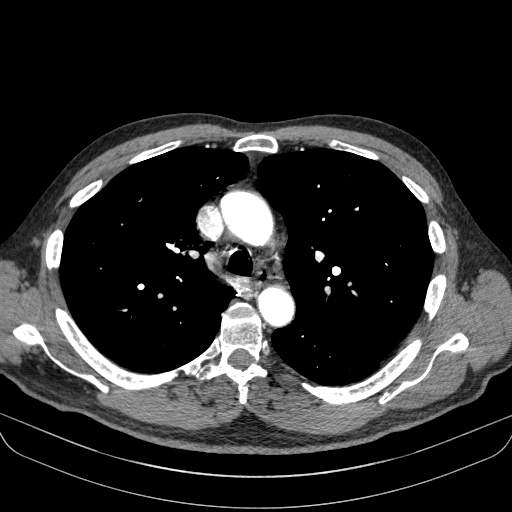
[im 585/663  lung]
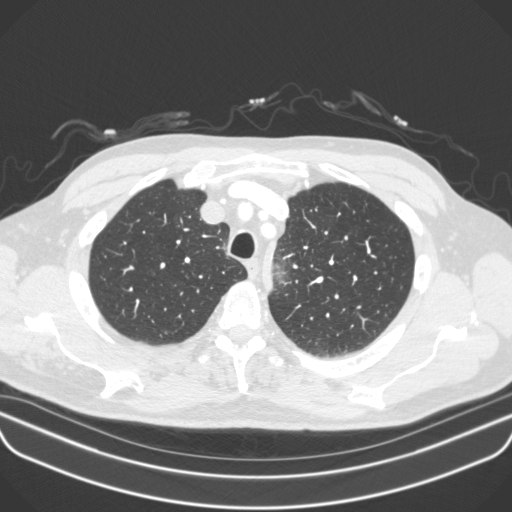
[im 624/663  soft-tissue]
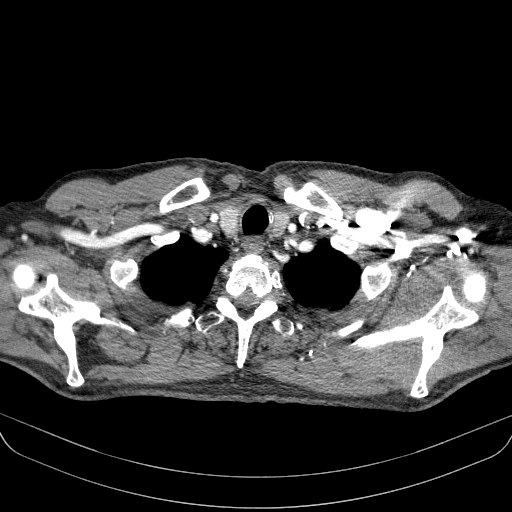

[16 of 32 positions shown; findings below may reference images not displayed]

FINDINGS: CTA CHEST FINDINGS

Cardiovascular: Minor thoracic aortic atherosclerosis without acute
vascular finding. No aneurysm or dissection. Patent 3 vessel arch
anatomy.

Visualized central pulmonary arteries are patent. No significant
filling defect or pulmonary embolus.

Heart is enlarged. There is mild dilatation of the left atrium and
left ventricle compatible with known severe mitral valvular disease.

Pulmonary veins all appear patent. No significant left atrial
appendage abnormality or thrombus.

Innominate vein and SVC appear patent. No central Lateisha
process.

Mediastinum/Nodes: No enlarged mediastinal, hilar, or axillary lymph
nodes. Thyroid gland, trachea, and esophagus demonstrate no
significant findings.

Lungs/Pleura: Minor centrilobular emphysema in the upper lobes.

Minimal bibasilar atelectasis/scarring. Minimal inferior lingula and
right middle lobe atelectasis/scarring. Peripheral left upper lobe
minor tree in bud opacity, images 54 through 58 series 8.

No interstitial process or edema.

Trachea and central airways are patent.

No pleural abnormality, effusion or pneumothorax.

Musculoskeletal: No acute osseous finding. Degenerative changes of
the spine. Intact sternum.

Review of the MIP images confirms the above findings.

CTA ABDOMEN AND PELVIS FINDINGS

VASCULAR

Aorta: Minor abdominal aortic atherosclerosis without aneurysm,
occlusive process, or dissection. No acute aortic abnormality.
Normal caliber.

Celiac: Widely patent origin including its branches.

SMA: Widely patent including its branches.

Renals: Both main renal arteries appear patent. No renal vascular
occlusive process. No accessory renal vasculature.

IMA: Remains patent off the distal aorta including its branches.

Inflow: Minor iliac atherosclerosis and tortuosity without inflow
disease or occlusion. No acute iliac arterial abnormality. The
common, internal and external iliac arteries all remain patent and
normal in caliber.

Visualized common femoral, proximal profunda femoral, proximal
superficial femoral arteries are widely patent and normal in
caliber.

Veins: Dedicated venous imaging not performed.

Review of the MIP images confirms the above findings.

NON-VASCULAR

Hepatobiliary: No focal liver abnormality is seen. No gallstones,
gallbladder wall thickening, or biliary dilatation.

Pancreas: Unremarkable. No pancreatic ductal dilatation or
surrounding inflammatory changes.

Spleen: Normal in size without focal abnormality.

Adrenals/Urinary Tract: Adrenal glands are unremarkable. Kidneys are
normal, without renal calculi, focal lesion, or hydronephrosis.
Bladder is unremarkable.

Stomach/Bowel: Limited without oral contrast. No significant
dilatation, ileus, obstruction pattern, free air. Normal appendix.
Diverticulosis noted, most pronounced in the sigmoid region. No
acute inflammatory process.

No free fluid, fluid collection, hemorrhage, hematoma, or abscess.

Lymphatic: No bulky adenopathy.

Reproductive: Remote vasectomy changes.

Other: Stable postop changes of inguinal hernia repairs. No
recurrent hernia. Intact abdominal wall.

No ascites.

Musculoskeletal: Degenerative changes noted spine. Mild associated
scoliosis. With no acute compression fracture.

Review of the MIP images confirms the above findings.
IMPRESSION: Intact thoracoabdominal aorta with minor atherosclerotic change. No
acute aortic vascular process, occlusive disease, or dissection.

No significant acute central pulmonary embolus.

Left atrial and left ventricular chamber dilatation compatible with
chronic mitral valvular disease.

No other acute thoracic, abdominal or pelvic finding. additional
incidental and postoperative findings as above.

Aortic Atherosclerosis ([id number].H).

## 2021-12-12 NOTE — Progress Notes (Deleted)
Cardiology Office Note:    Date:  12/12/2021   ID:  Patrick Bell, DOB Sep 24, 1940, MRN [id number]  PCP:  Dettinger, Fransisca Kaufmann, MD   [place] Providers Cardiologist:  Shelva Majestic, MD Cardiology APP:  Ledora Bottcher, PA { Click to update primary MD,subspecialty MD or APP then REFRESH:1}    Referring MD: Dettinger, Fransisca Kaufmann, MD   No chief complaint on file. ***  History of Present Illness:    Patrick Bell is a 81 y.o. male with a hx of  severe MR, PAF, HTN, HLD, OSA, and asthma. Echo 11/2019 with normal EF 60-65%, and normal RV function, severe MR wit moderate holosystolic prolapse of the middle scallop of the posterior leaflet of the mitral valve. TEE confirmed mitral valve prolapse wit flail segment involving a portion of the posterior leaflet and severe MR. CT surgery was consulted and he underwent right and left heart cath that showed normal coronary arteries. He underwent mitral valve repair 03/11/20 with Dr. Roxy Manns, which was complicated by an acute type A aortic dissection requiring graft repair of his aorta and resuspension of his native aortic valve. Hospital course further complicated by pericardial effusion requiring a window, Afib treated with amiodarone and coumadin, anemia requiring transfusion, AKI, hypotension, and a new diagnosis of hypothyroidism. He converted back to sinus rhythm after pericardial window. He was discharged to [facility] 03/31/20, discharged home 04/07/20. Follow up CT imaging with CT surgery showed persistent aortic dissection flap through the arch, descending thoracic, abdominal aorta, bilateral iliac arteries, and left common femoral artery.  The dissection flap involves bilateral subclavian arteries, celiac, SMA, and bilateral renal arteries.   Given chronic dissection, Dr. Claiborne Billings increased metoprolol to 50 mg daily.  He has a history of BP differential and had follow up CT imaging 09/2020 that showed stable dissection as above with flap involving the  left subclavian artery superior mesenteric and both renal arteries. He was seen in clinic by Dr. Claiborne Billings 10/06/20. Repeat CT imaging 03/2021 with stable chronic dissection. Pt has a history of self-adjusting or self-discontinuing medications (synthroid, coumadin, BB).   In response to pt self-discontinuing metoprolol: Dr. Claiborne Billings noted  I again have strongly urged that he not adjust his own medications.  With his extensive aortic dissection he is at significant risk for dissection progression off beta-blocker therapy.  I have resume metoprolol succinate 50 mg daily.     He called our office with reports of dizziness for 2 weeks. He stopped his metoprolol but dizziness persisted. I saw him in follow up and recommended restarting his BB. He was orthostatic - dropped from 140 lying to 108 standing after 3 min. He suspects the metoprolol was causing his dizziness, confirmed with his online research. We had a long discussion regarding his chronic dissection and need for BB therapy and to control his resting blood pressure. He reported diarrhea monthly and admits to not hydrating well.  We discussed that his dizziness may be from dehydration.  He was last seen by Dr. Claiborne Billings 09/07/21 and was taking BB, amlodipine, statin, and coumadin. He presents today for routine follow up.      Chronic thoracic and descending aortic dissection Type aortic dissections s/p graft repair Hypertension Continue beta-blocker and amlodipine It is important to control his blood pressure   MVP s/p mitral valve repair Stable on last echo Is now following with Dr. Cyndia Bent    OSA on CPAP Compliant ?   PAF Chronic coumadin therapy Discussed need for CPAP  to prevent Afib Continue coumadin   Moderate RV dysfunction Suspect driven by untreated OSA and likely COPD I referred to pulmonology - did he see?     Past Medical History:  Diagnosis Date   Acute thoracic aortic dissection (HCC) 03/11/2020   Type A   Allergy     Asthma    Cataract    Cataract    Diverticulitis    Dyspnea    GERD (gastroesophageal reflux disease)    Heart murmur 12/2019   Hyperlipidemia    Hypertension    Inguinal hernia bilateral, non-recurrent    S/P aortic dissection repair 03/11/2020   supracoronary straight graft repair with resuspension of native aortic valve and open distal anastomosis for intraoperative acute type A aortic dissection    S/P mitral valve repair 03/11/2020   Complex valvuloplasty including artificial Gore-tex neochord placement x 4, suture plication of posterior leaflet and posteromedial commissure and 32 mm Sorin Memo 4D ring annuloplasty   Sleep apnea     Past Surgical History:  Procedure Laterality Date   bilateral inguinal hernia     CARDIAC CATHETERIZATION Bilateral 02/21/2020   CHEST TUBE INSERTION N/A 03/22/2020   Procedure: CHEST TUBE INSERTION OF 28 BLAKE DRAIN.;  Surgeon: Grace Isaac, MD;  Location: [place] [facility];  Service: Thoracic;  Laterality: N/A;   COLONOSCOPY  06/09/2004   XLK:[id number] colon diverticulosis, otherwise normal colonoscopy   EYE SURGERY  08/2014   HERNIA REPAIR     MITRAL VALVE REPAIR N/A 03/11/2020   Procedure: MITRAL VALVE REPAIR (MVR) USING MEMO 4D SIZE 32 RING;  Surgeon: Rexene Alberts, MD;  Location: [place];  Service: Open Heart Surgery;  Laterality: N/A;   PALATE / UVULA BIOPSY / EXCISION     PERICARDIAL WINDOW N/A 03/22/2020   Procedure: SUBXYPHOID POST-OP PERICARDIAL FLUID DRAINAGE WITH CHEST TUBE INSERTION.;  Surgeon: Grace Isaac, MD;  Location: [place];  Service: Thoracic;  Laterality: N/A;   REPAIR OF ACUTE ASCENDING THORACIC AORTIC DISSECTION N/A 03/11/2020   Procedure: REPAIR OF ACUTE ASCENDING THORACIC AORTIC DISSECTION USING A HEMASHIELD PLATINUM 26MM STRAIGHT GRAFT AND A HEASHIELD PLATINUM 28X10MM SINGLE ARM GRAFT;  Surgeon: Rexene Alberts, MD;  Location: [place];  Service: Open Heart Surgery;  Laterality: N/A;   RIGHT/LEFT HEART CATH AND CORONARY  ANGIOGRAPHY N/A 02/21/2020   Procedure: RIGHT/LEFT HEART CATH AND CORONARY ANGIOGRAPHY;  Surgeon: Troy Sine, MD;  Location: [place] [facility];  Service: Cardiovascular;  Laterality: N/A;   TEE WITHOUT CARDIOVERSION N/A 12/19/2019   Procedure: TRANSESOPHAGEAL ECHOCARDIOGRAM (TEE);  Surgeon: Buford Dresser, MD;  Location: [facility] ENDOSCOPY;  Service: Cardiovascular;  Laterality: N/A;   TEE WITHOUT CARDIOVERSION N/A 03/11/2020   Procedure: TRANSESOPHAGEAL ECHOCARDIOGRAM (TEE);  Surgeon: Rexene Alberts, MD;  Location: [place];  Service: Open Heart Surgery;  Laterality: N/A;   TEE WITHOUT CARDIOVERSION N/A 03/22/2020   Procedure: TRANSESOPHAGEAL ECHOCARDIOGRAM (TEE);  Surgeon: Grace Isaac, MD;  [facility];  Service: Thoracic;  Laterality: N/A;   TONSILLECTOMY     VASECTOMY      Current Medications: No outpatient medications have been marked as taking for the 12/14/21 encounter (Appointment) with Ledora Bottcher, [place].     Allergies:   Patient has no known allergies.   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  Tobacco Use   Smoking status: Former    Packs/day: 4.00    Years: 25.00    Total pack years: 100.00    Types: Cigarettes    Quit date: 06/23/1982    Years since quitting: 39.4   Smokeless tobacco: Never   Tobacco comments:    pt was a truck Land Use: Never used  Substance and Sexual Activity   Alcohol use: Yes    Alcohol/week: 5.0 standard drinks of alcohol    Types: 5 Shots of liquor per week    Comment: brandy a few times a month, beer once a month   Drug use: No   Sexual activity: Yes    Birth control/protection: Surgical  Other Topics Concern   Not on file  Social History  Narrative   Not on file   Social Determinants of Health   Financial Resource Strain: Low Risk  (11/19/2021)   Overall Financial Resource Strain (CARDIA)    Difficulty of Paying Living Expenses: Not hard at all  Food Insecurity: No Food Insecurity (11/19/2021)   Hunger Vital Sign    Worried About Running Out of Food in the Last Year: Never true    Ran Out of Food in the Last Year: Never true  Transportation Needs: No Transportation Needs (11/19/2021)   PRAPARE - Hydrologist (Medical): No    Lack of Transportation (Non-Medical): No  Physical Activity: Sufficiently Active (11/19/2021)   Exercise Vital Sign    Days of Exercise per Week: 5 days    Minutes of Exercise per Session: 40 min  Stress: No Stress Concern Present (11/19/2021)   [place]    Feeling of Stress : Not at all  Social Connections: [place] (11/19/2021)   Social Connection and Isolation Panel [NHANES]    Frequency of Communication with Friends and Family: More than three times a week    Frequency of Social Gatherings with Friends and Family: More than three times a week    Attends Religious Services: More than 4 times per year    Active Member of [employer] or Organizations: Yes    Attends Music therapist: More than 4 times per year    Marital Status: Married     Family History: The patient's ***family history includes Arthritis in his brother, father, and sister; Dementia (age of onset: 43) in his mother; Heart disease in his father and mother; Hip fracture (age of onset: 38) in his mother; Osteoporosis in his mother. There is no history of Colon cancer.  ROS:   Please see the history of present illness.    *** All other systems reviewed and are negative.  EKGs/Labs/Other Studies Reviewed:    The following studies were reviewed today:  Echo 04/15/21:  1. Left ventricular ejection fraction, by  estimation, is 60 to 65%. Left  ventricular ejection fraction by 3D volume is 63 %. The left ventricle has  normal function. The left ventricle has no regional wall motion  abnormalities. There is mild asymmetric  left ventricular hypertrophy. Left ventricular diastolic parameters are  consistent with Grade I diastolic dysfunction (impaired relaxation).   2. Right ventricular systolic function is moderately reduced. The right  ventricular size is normal. There is normal pulmonary artery systolic  pressure.   3. Left atrial size was  moderately dilated.   4. Right atrial size was mildly dilated.   5. The mitral valve has been repaired/replaced. Mild mitral valve  regurgitation.   6. The aortic valve is grossly normal. Aortic valve regurgitation is  mild.   EKG:  EKG is *** ordered today.  The ekg ordered today demonstrates ***  Recent Labs: 07/08/2021: ALT 10; BUN 16; Creatinine, Ser 1.12; Hemoglobin 14.0; Platelets 176; Potassium 4.3; Sodium 144; TSH 0.880  Recent Lipid Panel    Component Value Date/Time   CHOL 177 07/08/2021 1104   CHOL 168 06/18/2012 1031   TRIG 65 07/08/2021 1104   TRIG 101 01/14/2013 1218   TRIG 102 06/18/2012 1031   HDL 73 07/08/2021 1104   HDL 65 01/14/2013 1218   HDL 62 06/18/2012 1031   CHOLHDL 2.4 07/08/2021 1104   LDLCALC 92 07/08/2021 1104   LDLCALC 99 01/14/2013 1218   LDLCALC 86 06/18/2012 1031     Risk Assessment/Calculations:   {Does this patient have ATRIAL FIBRILLATION?:[id number]}  No BP recorded.  {Refresh Note OR Click here to enter BP  :1}***         Physical Exam:    VS:  There were no vitals taken for this visit.    Wt Readings from Last 3 Encounters:  12/05/21 185 lb (83.9 kg)  11/19/21 185 lb (83.9 kg)  11/08/21 185 lb 3.2 oz (84 kg)     GEN: *** Well nourished, well developed in no acute distress HEENT: Normal NECK: No JVD; No carotid bruits LYMPHATICS: No lymphadenopathy CARDIAC: ***RRR, no murmurs, rubs,  gallops RESPIRATORY:  Clear to auscultation without rales, wheezing or rhonchi  ABDOMEN: Soft, non-tender, non-distended MUSCULOSKELETAL:  No edema; No deformity  SKIN: Warm and dry NEUROLOGIC:  Alert and oriented x 3 PSYCHIATRIC:  Normal affect   ASSESSMENT:    No diagnosis found. PLAN:    In order of problems listed above:  ***      {Are you ordering a CV Procedure (e.g. stress test, cath, DCCV, TEE, etc)?   Press F2        :[id number]}    Medication Adjustments/Labs and Tests Ordered: Current medicines are reviewed at length with the patient today.  Concerns regarding medicines are outlined above.  No orders of the defined types were placed in this encounter.  No orders of the defined types were placed in this encounter.   There are no Patient Instructions on file for this visit.   Signed, Ledora Bottcher, [place]  12/12/2021 7:47 PM    [facility]

## 2021-12-14 ENCOUNTER — Ambulatory Visit: Payer: Medicare Other

## 2021-12-14 ENCOUNTER — Ambulatory Visit: Payer: Medicare Other | Admitting: Physician Assistant

## 2021-12-19 NOTE — Progress Notes (Unsigned)
Cardiology Office Note:    Date:  12/19/2021   ID:  [name], DOB 05-13-1940, MRN [id number]  PCP:  Dettinger, Fransisca Kaufmann, MD   [place] Providers Cardiologist:  Shelva Majestic, MD Cardiology APP:  Ledora Bottcher, PA { Click to update primary MD,subspecialty MD or APP then REFRESH:1}    Referring MD: Dettinger, Fransisca Kaufmann, MD   No chief complaint on file. ***  History of Present Illness:    Patrick Bell is a 81 y.o. male with a hx of ***  Past Medical History:  Diagnosis Date   Acute thoracic aortic dissection (HCC) 03/11/2020   Type A   Allergy    Asthma    Cataract    Cataract    Diverticulitis    Dyspnea    GERD (gastroesophageal reflux disease)    Heart murmur 12/2019   Hyperlipidemia    Hypertension    Inguinal hernia bilateral, non-recurrent    S/P aortic dissection repair 03/11/2020   supracoronary straight graft repair with resuspension of native aortic valve and open distal anastomosis for intraoperative acute type A aortic dissection    S/P mitral valve repair 03/11/2020   Complex valvuloplasty including artificial Gore-tex neochord placement x 4, suture plication of posterior leaflet and posteromedial commissure and 32 mm Sorin Memo 4D ring annuloplasty   Sleep apnea     Past Surgical History:  Procedure Laterality Date   bilateral inguinal hernia     CARDIAC CATHETERIZATION Bilateral 02/21/2020   CHEST TUBE INSERTION N/A 03/22/2020   Procedure: CHEST TUBE INSERTION OF 28 BLAKE DRAIN.;  Surgeon: Grace Isaac, MD;  Location: [place] [facility];  Service: Thoracic;  Laterality: N/A;   COLONOSCOPY  06/09/2004   DXA:JOINOMV colon diverticulosis, otherwise normal colonoscopy   EYE SURGERY  08/2014   HERNIA REPAIR     MITRAL VALVE REPAIR N/A 03/11/2020   Procedure: MITRAL VALVE REPAIR (MVR) USING MEMO 4D SIZE 32 RING;  Surgeon: Rexene Alberts, MD;  Location: [place];  Service: Open Heart Surgery;  Laterality: N/A;   PALATE / UVULA BIOPSY /  EXCISION     PERICARDIAL WINDOW N/A 03/22/2020   Procedure: SUBXYPHOID POST-OP PERICARDIAL FLUID DRAINAGE WITH CHEST TUBE INSERTION.;  Surgeon: Grace Isaac, MD;  Location: [place];  Service: Thoracic;  Laterality: N/A;   REPAIR OF ACUTE ASCENDING THORACIC AORTIC DISSECTION N/A 03/11/2020   Procedure: REPAIR OF ACUTE ASCENDING THORACIC AORTIC DISSECTION USING A HEMASHIELD PLATINUM 26MM STRAIGHT GRAFT AND A HEASHIELD PLATINUM 28X10MM SINGLE ARM GRAFT;  Surgeon: Rexene Alberts, MD;  Location: [place];  Service: Open Heart Surgery;  Laterality: N/A;   RIGHT/LEFT HEART CATH AND CORONARY ANGIOGRAPHY N/A 02/21/2020   Procedure: RIGHT/LEFT HEART CATH AND CORONARY ANGIOGRAPHY;  Surgeon: Troy Sine, MD;  Location: [place] [facility];  Service: Cardiovascular;  Laterality: N/A;   TEE WITHOUT CARDIOVERSION N/A 12/19/2019   Procedure: TRANSESOPHAGEAL ECHOCARDIOGRAM (TEE);  Surgeon: Buford Dresser, MD;  Location: [facility] ENDOSCOPY;  Service: Cardiovascular;  Laterality: N/A;   TEE WITHOUT CARDIOVERSION N/A 03/11/2020   Procedure: TRANSESOPHAGEAL ECHOCARDIOGRAM (TEE);  Surgeon: Rexene Alberts, MD;  Location: [place];  Service: Open Heart Surgery;  Laterality: N/A;   TEE WITHOUT CARDIOVERSION N/A 03/22/2020   Procedure: TRANSESOPHAGEAL ECHOCARDIOGRAM (TEE);  Surgeon: Grace Isaac, MD;  Location: [facility] OR;  Service: Thoracic;  Laterality: N/A;   TONSILLECTOMY     VASECTOMY      Current Medications: No outpatient medications have been marked as taking  for the 12/21/21 encounter (Appointment) with Ledora Bottcher, [place].     Allergies:   Patient has no known allergies.   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  Tobacco Use   Smoking status:  Former    Packs/day: 4.00    Years: 25.00    Total pack years: 100.00    Types: Cigarettes    Quit date: 06/23/1982    Years since quitting: 39.5   Smokeless tobacco: Never   Tobacco comments:    pt was a truck Land Use: Never used  Substance and Sexual Activity   Alcohol use: Yes    Alcohol/week: 5.0 standard drinks of alcohol    Types: 5 Shots of liquor per week    Comment: brandy a few times a month, beer once a month   Drug use: No   Sexual activity: Yes    Birth control/protection: Surgical  Other Topics Concern   Not on file  Social History Narrative   Not on file   Social Determinants of Health   Financial Resource Strain: Low Risk  (11/19/2021)   Overall Financial Resource Strain (CARDIA)    Difficulty of Paying Living Expenses: Not hard at all  Food Insecurity: No Food Insecurity (11/19/2021)   Hunger Vital Sign    Worried About Running Out of Food in the Last Year: Never true    Ran Out of Food in the Last Year: Never true  Transportation Needs: No Transportation Needs (11/19/2021)   PRAPARE - Hydrologist (Medical): No    Lack of Transportation (Non-Medical): No  Physical Activity: Sufficiently Active (11/19/2021)   Exercise Vital Sign    Days of Exercise per Week: 5 days    Minutes of Exercise per Session: 40 min  Stress: No Stress Concern Present (11/19/2021)   [place]    Feeling of Stress : Not at all  Social Connections: [place] (11/19/2021)   Social Connection and Isolation Panel [NHANES]    Frequency of Communication with Friends and Family: More than three times a week    Frequency of Social Gatherings with Friends and Family: More than three times a week    Attends Religious Services: More than 4 times per year    Active Member of [employer] or Organizations: Yes    Attends Music therapist: More than 4 times per  year    Marital Status: Married     Family History: The patient's ***family history includes Arthritis in his brother, father, and sister; Dementia (age of onset: 52) in his mother; Heart disease in his father and mother; Hip fracture (age of onset: 55) in his mother; Osteoporosis in his mother. There is no history of Colon cancer.  ROS:   Please see the history of present illness.    *** All other systems reviewed and are negative.  EKGs/Labs/Other Studies Reviewed:    The following studies were reviewed today: ***  EKG:  EKG is *** ordered today.  The ekg ordered today demonstrates ***  Recent Labs: 07/08/2021: ALT 10; BUN 16; Creatinine, Ser 1.12; Hemoglobin 14.0; Platelets 176; Potassium 4.3;  Sodium 144; TSH 0.880  Recent Lipid Panel    Component Value Date/Time   CHOL 177 07/08/2021 1104   CHOL 168 06/18/2012 1031   TRIG 65 07/08/2021 1104   TRIG 101 01/14/2013 1218   TRIG 102 06/18/2012 1031   HDL 73 07/08/2021 1104   HDL 65 01/14/2013 1218   HDL 62 06/18/2012 1031   CHOLHDL 2.4 07/08/2021 1104   LDLCALC 92 07/08/2021 1104   LDLCALC 99 01/14/2013 1218   LDLCALC 86 06/18/2012 1031     Risk Assessment/Calculations:   {Does this patient have ATRIAL FIBRILLATION?:[phone]}  No BP recorded.  {Refresh Note OR Click here to enter BP  :1}***         Physical Exam:    VS:  There were no vitals taken for this visit.    Wt Readings from Last 3 Encounters:  12/05/21 185 lb (83.9 kg)  11/19/21 185 lb (83.9 kg)  11/08/21 185 lb 3.2 oz (84 kg)     GEN: *** Well nourished, well developed in no acute distress HEENT: Normal NECK: No JVD; No carotid bruits LYMPHATICS: No lymphadenopathy CARDIAC: ***RRR, no murmurs, rubs, gallops RESPIRATORY:  Clear to auscultation without rales, wheezing or rhonchi  ABDOMEN: Soft, non-tender, non-distended MUSCULOSKELETAL:  No edema; No deformity  SKIN: Warm and dry NEUROLOGIC:  Alert and oriented x 3 PSYCHIATRIC:  Normal affect    ASSESSMENT:    No diagnosis found. PLAN:    In order of problems listed above:  ***      {Are you ordering a CV Procedure (e.g. stress test, cath, DCCV, TEE, etc)?   Press F2        :[id number]}    Medication Adjustments/Labs and Tests Ordered: Current medicines are reviewed at length with the patient today.  Concerns regarding medicines are outlined above.  No orders of the defined types were placed in this encounter.  No orders of the defined types were placed in this encounter.   There are no Patient Instructions on file for this visit.   Signed, Ledora Bottcher, [place]  12/19/2021 8:56 PM    [facility]

## 2021-12-21 ENCOUNTER — Ambulatory Visit: Payer: Medicare Other

## 2021-12-21 ENCOUNTER — Encounter: Payer: Self-pay | Admitting: Physician Assistant

## 2021-12-21 ENCOUNTER — Ambulatory Visit: Payer: Medicare Other | Attending: Physician Assistant | Admitting: Physician Assistant

## 2021-12-21 VITALS — BP 132/74 | HR 72 | Ht 70.0 in | Wt 183.6 lb

## 2021-12-21 DIAGNOSIS — I480 Paroxysmal atrial fibrillation: Secondary | ICD-10-CM | POA: Diagnosis not present

## 2021-12-21 DIAGNOSIS — Z5181 Encounter for therapeutic drug level monitoring: Secondary | ICD-10-CM

## 2021-12-21 DIAGNOSIS — R0683 Snoring: Secondary | ICD-10-CM | POA: Diagnosis not present

## 2021-12-21 DIAGNOSIS — I71019 Dissection of thoracic aorta, unspecified: Secondary | ICD-10-CM | POA: Diagnosis not present

## 2021-12-21 DIAGNOSIS — I519 Heart disease, unspecified: Secondary | ICD-10-CM | POA: Diagnosis not present

## 2021-12-21 DIAGNOSIS — G4733 Obstructive sleep apnea (adult) (pediatric): Secondary | ICD-10-CM

## 2021-12-21 DIAGNOSIS — Z7901 Long term (current) use of anticoagulants: Secondary | ICD-10-CM

## 2021-12-21 DIAGNOSIS — Z9889 Other specified postprocedural states: Secondary | ICD-10-CM | POA: Diagnosis not present

## 2021-12-21 DIAGNOSIS — I10 Essential (primary) hypertension: Secondary | ICD-10-CM | POA: Diagnosis not present

## 2021-12-21 LAB — POCT INR: INR: 2 (ref 2.0–3.0)

## 2021-12-21 MED ORDER — AMLODIPINE BESYLATE 5 MG PO TABS: 5.0000 mg | ORAL_TABLET | Freq: Two times a day (BID) | ORAL | 3 refills | Status: DC

## 2021-12-21 NOTE — Patient Instructions (Signed)
Continue taking 1 tablet daily except 1/2 tablet each Sunday.   - Repeat INR in 8 weeks

## 2021-12-21 NOTE — Patient Instructions (Signed)
Medication Instructions:  Your physician has recommended you make the following change in your medication INCREASE: Amlodipine '5mg'$  Twice Daily  Start with 2.'5mg'$  of amlodipine in the AM and '5mg'$  in the PM  If your blood pressure systolic number is consistently above 130 please take '5mg'$  in the AM and 5 in the PM.  *If you need a refill on your cardiac medications before your next appointment, please call your pharmacy*  Please take your blood pressure daily and please include heart rates.   HOW TO TAKE YOUR BLOOD PRESSURE: Rest 5 minutes before taking your blood pressure. Don't smoke or drink caffeinated beverages for at least 30 minutes before. Take your blood pressure before (not after) you eat. Sit comfortably with your back supported and both feet on the floor (don't cross your legs). Elevate your arm to heart level on a table or a desk. Use the proper sized cuff. It should fit smoothly and snugly around your bare upper arm. There should be enough room to slip a fingertip under the cuff. The bottom edge of the cuff should be 1 inch above the crease of the elbow. Ideally, take 3 measurements at one sitting and record the average.    Lab Work: NONE ordered at this time of appointment   If you have labs (blood work) drawn today and your tests are completely normal, you will receive your results only by: [place] (if you have MyChart) OR A paper copy in the mail If you have any lab test that is abnormal or we need to change your treatment, we will call you to review the results.   Testing/Procedures: NONE ordered at this time of appointment     Follow-Up: At [facility], you and your health needs are our priority.  As part of our continuing mission to provide you with exceptional heart care, we have created designated Provider Care Teams.  These Care Teams include your primary Cardiologist (physician) and Advanced Practice Providers (APPs -  Physician Assistants and  Nurse Practitioners) who all work together to provide you with the care you need, when you need it.  We recommend signing up for the patient portal called "MyChart".  Sign up information is provided on this After Visit Summary.  MyChart is used to connect with patients for Virtual Visits (Telemedicine).  Patients are able to view lab/test results, encounter notes, upcoming appointments, etc.  Non-urgent messages can be sent to your provider as well.   To learn more about what you can do with MyChart, go to [email or web address].    Other Instructions Keep upcoming appointment with Dr. Claiborne Billings   Important Information About Sugar

## 2021-12-27 DIAGNOSIS — M79674 Pain in right toe(s): Secondary | ICD-10-CM | POA: Diagnosis not present

## 2021-12-27 DIAGNOSIS — M79675 Pain in left toe(s): Secondary | ICD-10-CM | POA: Diagnosis not present

## 2021-12-27 DIAGNOSIS — L600 Ingrowing nail: Secondary | ICD-10-CM | POA: Diagnosis not present

## 2021-12-27 DIAGNOSIS — B351 Tinea unguium: Secondary | ICD-10-CM | POA: Diagnosis not present

## 2022-01-05 ENCOUNTER — Encounter: Payer: Self-pay | Admitting: Family Medicine

## 2022-01-05 ENCOUNTER — Ambulatory Visit (INDEPENDENT_AMBULATORY_CARE_PROVIDER_SITE_OTHER): Payer: Medicare Other | Admitting: Family Medicine

## 2022-01-05 VITALS — BP 136/71 | HR 65 | Temp 98.0°F | Ht 70.0 in | Wt 183.0 lb

## 2022-01-05 DIAGNOSIS — I480 Paroxysmal atrial fibrillation: Secondary | ICD-10-CM

## 2022-01-05 DIAGNOSIS — E039 Hypothyroidism, unspecified: Secondary | ICD-10-CM | POA: Diagnosis not present

## 2022-01-05 DIAGNOSIS — Z23 Encounter for immunization: Secondary | ICD-10-CM | POA: Diagnosis not present

## 2022-01-05 DIAGNOSIS — E7800 Pure hypercholesterolemia, unspecified: Secondary | ICD-10-CM | POA: Diagnosis not present

## 2022-01-05 DIAGNOSIS — I10 Essential (primary) hypertension: Secondary | ICD-10-CM

## 2022-01-05 NOTE — Progress Notes (Signed)
BP 136/71   Pulse 65   Temp 98 F (36.7 C)   Ht 5' 10"  (1.778 m)   Wt 183 lb (83 kg)   SpO2 98%   BMI 26.26 kg/m    Subjective:   Patient ID: Patrick Bell, male    DOB: 1941-01-09, 81 y.o.   MRN: [id number]  HPI: Patrick Bell is a 81 y.o. male presenting on 01/05/2022 for Medical Management of Chronic Issues, Hypertension, Hypothyroidism, and Gastroesophageal Reflux   HPI Hypertension and A-fib with a history of dissection. Patient is currently on metoprolol and amlodipine, and their blood pressure today is 136/71. Patient does have some lightheadedness and dizziness, orthostatic. Patient denies headaches, blurred vision, chest pains, shortness of breath, or weakness. Denies any side effects from medication and is content with current medication.   Hypothyroidism recheck Patient is coming in for thyroid recheck today as well. They deny any issues with hair changes or heat or cold problems or diarrhea or constipation. They deny any chest pain or palpitations. They are currently on levothyroxine 50 micrograms   Hyperlipidemia Patient is coming in for recheck of his hyperlipidemia. The patient is currently taking simvastatin. They deny any issues with myalgias or history of liver damage from it. They deny any focal numbness or weakness or chest pain.   Relevant past medical, surgical, family and social history reviewed and updated as indicated. Interim medical history since our last visit reviewed. Allergies and medications reviewed and updated.  Review of Systems  Constitutional:  Negative for chills and fever.  Eyes:  Negative for visual disturbance.  Respiratory:  Negative for shortness of breath and wheezing.   Cardiovascular:  Negative for chest pain and leg swelling.  Musculoskeletal:  Negative for back pain and gait problem.  Skin:  Negative for rash.  Neurological:  Negative for dizziness, weakness and light-headedness.  All other systems reviewed and are  negative.   Per HPI unless specifically indicated above   Allergies as of 01/05/2022   No Known Allergies      Medication List        Accurate as of January 05, 2022 10:42 AM. If you have any questions, ask your nurse or doctor.          STOP taking these medications    budesonide-formoterol 80-4.5 MCG/ACT inhaler Commonly known as: Symbicort Stopped by: Worthy Rancher, MD   predniSONE 20 MG tablet Commonly known as: DELTASONE Stopped by: Fransisca Kaufmann Melisha Eggleton, MD   terbinafine 250 MG tablet Commonly known as: LAMISIL Stopped by: Fransisca Kaufmann Vincent Streater, MD       TAKE these medications    Abdominal Binder/Elastic Large Misc 1 each by Does not apply route daily.   acetaminophen 325 MG tablet Commonly known as: TYLENOL Take 1-2 tablets (325-650 mg total) by mouth every 4 (four) hours as needed for mild pain.   albuterol 108 (90 Base) MCG/ACT inhaler Commonly known as: VENTOLIN HFA INHALE 2 PUFFS EVERY 6 HOURS AS NEEDED FOR WHEEZING OR SHORTNESS OF BREATH   amLODipine 5 MG tablet Commonly known as: NORVASC Take 1 tablet (5 mg total) by mouth 2 (two) times daily.   guaiFENesin 600 MG 12 hr tablet Commonly known as: MUCINEX Take 600 mg by mouth every 4 (four) hours as needed for cough or to loosen phlegm.   ipratropium 0.03 % nasal spray Commonly known as: ATROVENT SMARTSIG:2 Puff(s) Both Nares 2-3 Times Daily   levothyroxine 50 MCG tablet Commonly known as: SYNTHROID Take  1 tablet (50 mcg total) by mouth daily.   metoprolol succinate 50 MG 24 hr tablet Commonly known as: TOPROL-XL Take 1 tablet by mouth at night   montelukast 10 MG tablet Commonly known as: SINGULAIR TAKE 1 TABLET DAILY   Olopatadine HCl 0.2 % Soln Place 1 drop into both eyes daily as needed (allergies).   pantoprazole 40 MG tablet Commonly known as: PROTONIX TAKE 1 TABLET DAILY   simvastatin 10 MG tablet Commonly known as: ZOCOR TAKE ONE TABLET AT BEDTIME   warfarin 5 MG  tablet Commonly known as: COUMADIN Take as directed by the anticoagulation clinic. If you are unsure how to take this medication, talk to your nurse or doctor. Original instructions: TAKE ONE TABLET ONCE DAILY AT 4PM         Objective:   BP 136/71   Pulse 65   Temp 98 F (36.7 C)   Ht 5' 10"  (1.778 m)   Wt 183 lb (83 kg)   SpO2 98%   BMI 26.26 kg/m   Wt Readings from Last 3 Encounters:  01/05/22 183 lb (83 kg)  12/21/21 183 lb 9.6 oz (83.3 kg)  12/05/21 185 lb (83.9 kg)    Physical Exam Vitals and nursing note reviewed.  Constitutional:      General: He is not in acute distress.    Appearance: He is well-developed. He is not diaphoretic.  Eyes:     General: No scleral icterus.    Conjunctiva/sclera: Conjunctivae normal.  Neck:     Thyroid: No thyromegaly.  Cardiovascular:     Rate and Rhythm: Normal rate and regular rhythm.     Heart sounds: Normal heart sounds. No murmur heard. Pulmonary:     Effort: Pulmonary effort is normal. No respiratory distress.     Breath sounds: Normal breath sounds. No wheezing.  Musculoskeletal:        General: Normal range of motion.     Cervical back: Neck supple.  Lymphadenopathy:     Cervical: No cervical adenopathy.  Skin:    General: Skin is warm and dry.     Findings: No rash.  Neurological:     Mental Status: He is alert and oriented to person, place, and time.     Coordination: Coordination normal.  Psychiatric:        Behavior: Behavior normal.       Assessment & Plan:   Problem List Items Addressed This Visit       Cardiovascular and Mediastinum   Essential hypertension, benign - Primary   Relevant Orders   CBC with Differential/Platelet   CMP14+EGFR   Lipid panel   PAF (paroxysmal atrial fibrillation) (HCC)   Relevant Orders   CBC with Differential/Platelet     Endocrine   Hypothyroidism   Relevant Orders   TSH     Other   Hyperlipidemia   Relevant Orders   Lipid panel    This was  cardiology's last recommendations on the amlodipine Your physician has recommended you make the following change in your medication INCREASE: Amlodipine 70m Twice Daily  Start with 2.556mof amlodipine in the AM and 73m10mn the PM  If your blood pressure systolic number is consistently above 130 please take 73mg59m the AM and 5 in the PM.   He was recommended to keep taking metoprolol 50 mg daily as well.  He will have to tolerate some amount of dizziness at first but then he will get used to this per their recommendations.  Follow up plan: Return in about 6 months (around 07/07/2022), or if symptoms worsen or fail to improve, for Hypertension and cholesterol hyperlipidemia.  Counseling provided for all of the vaccine components No orders of the defined types were placed in this encounter.   Caryl Pina, MD [facility] 01/05/2022, 10:42 AM

## 2022-01-05 NOTE — Addendum Note (Signed)
Addended by: Alphonzo Dublin on: 01/05/2022 01:49 PM   Modules accepted: Orders

## 2022-01-05 NOTE — Patient Instructions (Signed)
This was cardiology's last recommendations on the amlodipine Your physician has recommended you make the following change in your medication INCREASE: Amlodipine '5mg'$  Twice Daily  Start with 2.'5mg'$  of amlodipine in the AM and '5mg'$  in the PM  If your blood pressure systolic number is consistently above 130 please take '5mg'$  in the AM and 5 in the PM.   He was recommended to keep taking metoprolol 50 mg daily as well.  He will have to tolerate some amount of dizziness at first but then he will get used to this per their recommendations.

## 2022-01-06 LAB — CMP14+EGFR
ALT: 14 IU/L (ref 0–44)
AST: 34 IU/L (ref 0–40)
Albumin/Globulin Ratio: 2.1 (ref 1.2–2.2)
Albumin: 4.2 g/dL (ref 3.7–4.7)
Alkaline Phosphatase: 61 IU/L (ref 44–121)
BUN/Creatinine Ratio: 20 (ref 10–24)
BUN: 21 mg/dL (ref 8–27)
Bilirubin Total: 0.5 mg/dL (ref 0.0–1.2)
CO2: 23 mmol/L (ref 20–29)
Calcium: 9.6 mg/dL (ref 8.6–10.2)
Chloride: 104 mmol/L (ref 96–106)
Creatinine, Ser: 1.05 mg/dL (ref 0.76–1.27)
Globulin, Total: 2 g/dL (ref 1.5–4.5)
Glucose: 85 mg/dL (ref 70–99)
Potassium: 4.3 mmol/L (ref 3.5–5.2)
Sodium: 143 mmol/L (ref 134–144)
Total Protein: 6.2 g/dL (ref 6.0–8.5)
eGFR: 71 mL/min/{1.73_m2} (ref >59)

## 2022-01-06 LAB — CBC WITH DIFFERENTIAL/PLATELET
Basophils Absolute: 0.1 10*3/uL (ref 0.0–0.2)
Basos: 2 %
EOS (ABSOLUTE): 0 10*3/uL (ref 0.0–0.4)
Eos: 1 %
Hematocrit: 41.2 % (ref 37.5–51.0)
Hemoglobin: 13.7 g/dL (ref 13.0–17.7)
Immature Grans (Abs): 0 10*3/uL (ref 0.0–0.1)
Immature Granulocytes: 0 %
Lymphocytes Absolute: 0.8 10*3/uL (ref 0.7–3.1)
Lymphs: 21 %
MCH: 28 pg (ref 26.6–33.0)
MCHC: 33.3 g/dL (ref 31.5–35.7)
MCV: 84 fL (ref 79–97)
Monocytes Absolute: 0.5 10*3/uL (ref 0.1–0.9)
Monocytes: 12 %
Neutrophils Absolute: 2.6 10*3/uL (ref 1.4–7.0)
Neutrophils: 64 %
Platelets: 214 10*3/uL (ref 150–450)
RBC: 4.89 x10E6/uL (ref 4.14–5.80)
RDW: 13.2 % (ref 11.6–15.4)
WBC: 4 10*3/uL (ref 3.4–10.8)

## 2022-01-06 LAB — LIPID PANEL
Chol/HDL Ratio: 2.6 ratio (ref 0.0–5.0)
Cholesterol, Total: 184 mg/dL (ref 100–199)
HDL: 70 mg/dL (ref >39)
LDL Chol Calc (NIH): 101 mg/dL — ABNORMAL HIGH (ref 0–99)
Triglycerides: 68 mg/dL (ref 0–149)
VLDL Cholesterol Cal: 13 mg/dL (ref 5–40)

## 2022-01-06 LAB — TSH: TSH: 1.16 u[IU]/mL (ref 0.450–4.500)

## 2022-01-07 ENCOUNTER — Ambulatory Visit: Payer: Medicare Other | Admitting: Family Medicine

## 2022-01-13 NOTE — Progress Notes (Signed)
Patient r/c. Please call back.

## 2022-01-19 DIAGNOSIS — M9903 Segmental and somatic dysfunction of lumbar region: Secondary | ICD-10-CM | POA: Diagnosis not present

## 2022-01-19 DIAGNOSIS — M9904 Segmental and somatic dysfunction of sacral region: Secondary | ICD-10-CM | POA: Diagnosis not present

## 2022-01-19 DIAGNOSIS — M5137 Other intervertebral disc degeneration, lumbosacral region: Secondary | ICD-10-CM | POA: Diagnosis not present

## 2022-01-19 DIAGNOSIS — M9905 Segmental and somatic dysfunction of pelvic region: Secondary | ICD-10-CM | POA: Diagnosis not present

## 2022-01-20 DIAGNOSIS — R0683 Snoring: Secondary | ICD-10-CM | POA: Diagnosis not present

## 2022-01-20 DIAGNOSIS — G4733 Obstructive sleep apnea (adult) (pediatric): Secondary | ICD-10-CM | POA: Diagnosis not present

## 2022-01-21 ENCOUNTER — Inpatient Hospital Stay: Payer: Medicare Other | Admitting: Nurse Practitioner

## 2022-01-24 DIAGNOSIS — M9903 Segmental and somatic dysfunction of lumbar region: Secondary | ICD-10-CM | POA: Diagnosis not present

## 2022-01-24 DIAGNOSIS — M9905 Segmental and somatic dysfunction of pelvic region: Secondary | ICD-10-CM | POA: Diagnosis not present

## 2022-01-24 DIAGNOSIS — M5137 Other intervertebral disc degeneration, lumbosacral region: Secondary | ICD-10-CM | POA: Diagnosis not present

## 2022-01-24 DIAGNOSIS — M9904 Segmental and somatic dysfunction of sacral region: Secondary | ICD-10-CM | POA: Diagnosis not present

## 2022-01-25 ENCOUNTER — Encounter: Payer: Self-pay | Admitting: Nurse Practitioner

## 2022-01-25 ENCOUNTER — Ambulatory Visit: Payer: Medicare Other | Admitting: Nurse Practitioner

## 2022-01-25 DIAGNOSIS — J4489 Other specified chronic obstructive pulmonary disease: Secondary | ICD-10-CM | POA: Diagnosis not present

## 2022-01-25 DIAGNOSIS — G4733 Obstructive sleep apnea (adult) (pediatric): Secondary | ICD-10-CM | POA: Diagnosis not present

## 2022-01-25 MED ORDER — STIOLTO RESPIMAT 2.5-2.5 MCG/ACT IN AERS
2.0000 | INHALATION_SPRAY | Freq: Every day | RESPIRATORY_TRACT | 0 refills | Status: DC
Start: 2022-01-25 — End: 2022-03-09

## 2022-01-25 NOTE — Progress Notes (Signed)
$'@Patient'v$  ID: Patrick Bell, male    DOB: January 01, 1941, 81 y.o.   MRN: [id number]  Chief Complaint  Patient presents with   Follow-up    Pt f/u he states he is doing well. CPAP therapy is going well since reducing pressures but he's still experiencing tiredness during the day    Referring provider: Dettinger, Fransisca Kaufmann, MD  HPI: 81 year old male, former smoker followed for COPD/asthma overlap and OSA on CPAP.  He is a patient Dr. Kavin Leech and last seen in office 11/08/2021. Seen by Dr. Halford Chessman for sleep. Past medical history significant for hypertension, severe MR status post repair, PAF on Coumadin, hypothyroidism, BPH, HLD, history of aortic dissection status postrepair.  TEST/EVENTS:  01/26/2021 split-night: Total AHI 25.3/h, supine AHI 35.3.  SPO2 low 82%. >>>  CPAP 18 cmH2O 09/07/2021 PFTs: FVC 88, FEV1 79, ratio 62, TLC 96, DLCOunc 89.  No significant BD in FEV1 but did have mid flow reversibility.  Mild to moderate obstructive disease 12/05/2021 CPAP titration, 185 lb >> optimal pressure 11 cmH2O with medium nasal cradle AirFit N30   11/08/2021: OV with Issa Luster NP for follow up and to reestablish management for his OSA on CPAP. He is currently followed by Dr. Claiborne Billings for management of this but he would like to switch back to Dr. Halford Chessman, who he saw in the past around 4-5 years ago. He has been having some issues with his CPAP machine and is still having symptoms, despite wearing it most nights. He does admit that he doesn't always wear it the whole night because he constantly gets woken up due to air blowing around his mask and into his eyes, even though his machine does not read any significant leaks. He feels like the mask doesn't fit him well and just feels as though he is not currently well controlled. He does not feel rested in the morning and has to take a nap about 2 hours after waking up in the morning. Residual AHI 7.3 with significant leaks. CPAP titration ordered to evaluate for need for  BiPAP  Regarding his breathing, he was seen by Dr. Silas Flood in May for consultation of DOE s/p mitral valve repair. He underwent pulmonary function testing, which showed mild to moderate obstruction with midflow reversibility. He does have a history of asthma but has not been on a maintenance inhaler in years. He continues to have trouble with his breathing. He gets winded with shopping and climbing. Can walk short distances without difficulty. He notices it's worse outside, especially during allergy season. Also notices an occasional wheeze. He does have a dry cough, which he attributes to his postnasal drainage. Not currently using any nasal sprays. He denies orthopnea, PND, leg swelling, hemoptysis, anorexia, weight loss. He uses his albuterol rescue at least twice a week. He takes singulair at bedtime. Started on Symbicort.   01/25/2022: Today - follow up Patient presents today for follow-up after undergoing CPAP titration and being started on Symbicort.  His sleep study showed that he was well controlled on 11 cmH2O with medium nasal cradle mask.  Orders were sent to adjust his CPAP to this and provide him with new mask; download shows that his DME changed him to 10 cmH2O. Feels like he is sleeping better and wakes feeling a little more rested.  Still does have some daytime sleepiness and is struggling with leaks at night.  They tend to wake him up so he ends up sleeping the last 2 to 3 hours without his CPAP  on.  Denies any drowsy driving. From a breathing standpoint, he had been using the Symbicort.  Did feel like it helped open up his chest but he developed hoarseness and throat irritation so he stopped using it.  He tried to decrease to 1 puff twice a day but this still did not help.  It also makes him cough.  Has been off of it for a few weeks now.  Feels at his baseline with no worsening dyspnea, cough or chest congestion.  Takes Singulair at bedtime.  Uses albuterol  occasionally.  12/25/2021-01/23/2022 CPAP 10 cmH2O 26/30 days; 77% >4hr; average usage 5 hours 57 minutes Leaks median 24, 95th 42.7 AHI 3.4  No Known Allergies  Immunization History  Administered Date(s) Administered   Fluad Quad(high Dose 65+) 02/13/2019, 01/06/2020, 02/05/2021, 01/05/2022   Influenza, High Dose Seasonal PF 01/23/2018   Influenza,inj,Quad PF,6+ Mos 01/14/2013, 03/14/2014, 03/11/2015, 01/19/2016, 01/18/2017   Moderna Sars-Covid-2 Vaccination 05/10/2019, 06/07/2019, 03/05/2020   Pneumococcal Conjugate-13 03/14/2014   Pneumococcal Polysaccharide-23 01/14/2013   Tdap 07/06/2020   Zoster Recombinat (Shingrix) 07/08/2021, 01/05/2022   Zoster, Live 06/14/2011    Past Medical History:  Diagnosis Date   Acute thoracic aortic dissection (HCC) 03/11/2020   Type A   Allergy    Asthma    Cataract    Cataract    Diverticulitis    Dyspnea    GERD (gastroesophageal reflux disease)    Heart murmur 12/2019   Hyperlipidemia    Hypertension    Inguinal hernia bilateral, non-recurrent    S/P aortic dissection repair 03/11/2020   supracoronary straight graft repair with resuspension of native aortic valve and open distal anastomosis for intraoperative acute type A aortic dissection    S/P mitral valve repair 03/11/2020   Complex valvuloplasty including artificial Gore-tex neochord placement x 4, suture plication of posterior leaflet and posteromedial commissure and 32 mm Sorin Memo 4D ring annuloplasty   Sleep apnea     Tobacco History: Social History   Tobacco Use  Smoking Status Former   Packs/day: 4.00   Years: 25.00   Total pack years: 100.00   Types: Cigarettes   Quit date: 06/23/1982   Years since quitting: 39.6  Smokeless Tobacco Never  Tobacco Comments   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     albuterol (VENTOLIN HFA) 108 (90 Base) MCG/ACT inhaler INHALE 2 PUFFS EVERY 6 HOURS AS NEEDED FOR WHEEZING OR SHORTNESS OF BREATH 8.5 g 2   amLODipine (NORVASC) 5 MG tablet Take 1 tablet (5 mg total) by mouth 2 (two) times daily. 180 tablet 3   guaiFENesin (MUCINEX) 600 MG 12 hr tablet Take 600 mg by mouth every 4 (four) hours as needed for cough or to loosen phlegm.     levothyroxine (SYNTHROID) 50 MCG tablet Take 1 tablet (50 mcg total) by mouth daily. 90 tablet 3   metoprolol succinate (TOPROL-XL) 50 MG 24 hr tablet Take 1 tablet by mouth at night 90 tablet 3   montelukast (SINGULAIR) 10 MG tablet TAKE 1 TABLET DAILY 90 tablet 3   Olopatadine HCl 0.2 % SOLN Place 1 drop into both eyes daily as needed (allergies).     pantoprazole (PROTONIX) 40 MG tablet TAKE 1  TABLET DAILY 90 tablet 3   simvastatin (ZOCOR) 10 MG tablet TAKE ONE TABLET AT BEDTIME 90 tablet 3   warfarin (COUMADIN) 5 MG tablet TAKE ONE TABLET ONCE DAILY AT 4PM 90 tablet 3   Elastic Bandages & Supports (ABDOMINAL BINDER/ELASTIC LARGE) MISC 1 each by Does not apply route daily. 1 each 0   ipratropium (ATROVENT) 0.03 % nasal spray SMARTSIG:2 Puff(s) Both Nares 2-3 Times Daily (Patient not taking: Reported on 01/25/2022)     No facility-administered medications prior to visit.     Review of Systems:   Constitutional: No weight loss or gain, night sweats, fevers, chills,or lassitude. +excessive daytime fatigue (improved) HEENT: No headaches, difficulty swallowing, tooth/dental problems, or sore throat. No sneezing, itching, ear ache. +nasal congestion, post nasal drip (improved) CV:  No chest pain, orthopnea, PND, swelling in lower extremities, anasarca, dizziness, palpitations, syncope Resp: +shortness of breath with exertion (unchanged); wheezing; dry cough. No excess mucus or change in color of mucus.  No hemoptysis.  No chest  wall deformity GI:  No heartburn, indigestion, abdominal pain, nausea, vomiting, diarrhea, change in bowel habits, loss of appetite, bloody stools.  GU: No dysuria, change in color of urine, urgency or frequency.  No flank pain, no hematuria  Skin: No rash, lesions, ulcerations MSK:  No joint pain or swelling.  No decreased range of motion.  No back pain. Neuro: No dizziness or lightheadedness.  Psych: No depression or anxiety. Mood stable.     Physical Exam:  BP 98/62   Pulse 77   Ht '5\' 10"'$  (1.778 m)   Wt 183 lb 12.8 oz (83.4 kg)   SpO2 97%   BMI 26.37 kg/m   GEN: Pleasant, interactive, well-appearing; in no acute distress. HEENT:  Normocephalic and atraumatic.  PERRLA. Sclera white. Nasal turbinates pink, moist and patent bilaterally. No rhinorrhea present. Oropharynx pink and moist, without exudate or edema. No lesions, ulcerations, or postnasal drip.  NECK:  Supple w/ fair ROM. No JVD present. Normal carotid impulses w/o bruits. Thyroid symmetrical with no goiter or nodules palpated. No lymphadenopathy.   CV: RRR, no m/r/g, no peripheral edema. Pulses intact, +2 bilaterally. No cyanosis, pallor or clubbing. PULMONARY:  Unlabored, regular breathing. Clear bilaterally A&P w/o wheezes/rales/rhonchi. No accessory muscle use. No dullness to percussion. GI: BS present and normoactive. Soft, non-tender to palpation. No organomegaly or masses detected. No CVA tenderness. MSK: No erythema, warmth or tenderness. Cap refil <2 sec all extrem. No deformities or joint swelling noted.  Neuro: A/Ox3. No focal deficits noted.   Skin: Warm, no lesions or rashe Psych: Normal affect and behavior. Judgement and thought content appropriate.     Lab Results:  CBC    Component Value Date/Time   WBC 4.0 01/05/2022 1056   WBC 8.3 12/02/2020 1851   RBC 4.89 01/05/2022 1056   RBC 5.34 12/02/2020 1851   HGB 13.7 01/05/2022 1056   HCT 41.2 01/05/2022 1056   PLT 214 01/05/2022 1056   MCV 84  01/05/2022 1056   MCH 28.0 01/05/2022 1056   MCH 27.7 12/02/2020 1851   MCHC 33.3 01/05/2022 1056   MCHC 31.7 12/02/2020 1851   RDW 13.2 01/05/2022 1056   LYMPHSABS 0.8 01/05/2022 1056   MONOABS 0.6 12/02/2020 1851   EOSABS 0.0 01/05/2022 1056   BASOSABS 0.1 01/05/2022 1056    BMET    Component Value Date/Time   NA 143 01/05/2022 1056   K 4.3 01/05/2022 1056   CL 104 01/05/2022 1056   CO2  23 01/05/2022 1056   GLUCOSE 85 01/05/2022 1056   GLUCOSE 105 (H) 12/02/2020 1851   BUN 21 01/05/2022 1056   CREATININE 1.05 01/05/2022 1056   CREATININE 0.91 06/18/2012 1031   CALCIUM 9.6 01/05/2022 1056   GFRNONAA >60 12/02/2020 1851   GFRNONAA 84 06/18/2012 1031   GFRAA 45 (L) 04/27/2020 1705   GFRAA >89 06/18/2012 1031    BNP    Component Value Date/Time   BNP 112.7 (H) 09/10/2020 1154     Imaging:  No results found.       Latest Ref Rng & Units 09/07/2021   11:54 AM  PFT Results  FVC-Pre L 3.50   FVC-Predicted Pre % 88   FVC-Post L 3.80   FVC-Predicted Post % 96   Pre FEV1/FVC % % 63   Post FEV1/FCV % % 62   FEV1-Pre L 2.21   FEV1-Predicted Pre % 79   FEV1-Post L 2.36   DLCO uncorrected ml/min/mmHg 21.70   DLCO UNC% % 89   DLCO corrected ml/min/mmHg 21.70   DLCO COR %Predicted % 89   DLVA Predicted % 84   TLC L 6.83   TLC % Predicted % 96   RV % Predicted % 116     No results found for: "NITRICOXIDE"      Assessment & Plan:   OSA (obstructive sleep apnea) Underwent CPAP titration on 9/3 >> optimal pressure was 11 cmH2O with medium nasal cradle AirFit N30.  Orders were placed.  For some reason he ended up on 10 cm of water.  He has had better control of his apneic events on this; residual AHI is 3.4, which is similar to his titration study.  Good compliance.  Still having trouble with mask leaks.  We will try him with a nasal pillow mask.  He will use a chinstrap if he continues to have leaks.  Cautioned to be aware of and avoid driving if  sleepy.  Patient Instructions  Stop Symbicort. Start Stiolto 2 puffs daily - provided with samples today. Let me know if this works well for you and we can send in a prescription  Continue Albuterol inhaler 2 puffs every 6 hours as needed for shortness of breath or wheezing. Notify if symptoms persist despite rescue inhaler/neb use.  Continue singulair 1 tab At bedtime    Continue to use CPAP every night, minimum of 4-6 hours a night.  Change to nasal pillow mask; can add chin strap if still having leaks with this   Follow up in 6 weeks with Dr. Silas Flood or Katie Kamri Gotsch,NP. If symptoms do not improve or worsen, please contact office for sooner follow up or seek emergency care.     Asthma-COPD overlap syndrome Mild to moderate obstructive disease.  He was previously started on Symbicort but had difficulty with throat irritation and voice hoarseness from ICS even with decreased dosing.  Still having symptoms of dyspnea and bronchospasm.  Recommended that we try him on a LABA/LAMA therapy.  He was agreeable to this plan.  Teach back performed on Stiolto.  Asthma action plan in place.  Continue as needed albuterol.    I spent 35 minutes of dedicated to the care of this patient on the date of this encounter to include pre-visit review of records, face-to-face time with the patient discussing conditions above, post visit ordering of testing, clinical documentation with the electronic health record, making appropriate referrals as documented, and communicating necessary findings to members of the patients care team.  Clayton Bibles, NP 01/25/2022  Pt aware and understands NP's role.

## 2022-01-25 NOTE — Assessment & Plan Note (Signed)
Underwent CPAP titration on 9/3 >> optimal pressure was 11 cmH2O with medium nasal cradle AirFit N30.  Orders were placed.  For some reason he ended up on 10 cm of water.  He has had better control of his apneic events on this; residual AHI is 3.4, which is similar to his titration study.  Good compliance.  Still having trouble with mask leaks.  We will try him with a nasal pillow mask.  He will use a chinstrap if he continues to have leaks.  Cautioned to be aware of and avoid driving if sleepy.  Patient Instructions  Stop Symbicort. Start Stiolto 2 puffs daily - provided with samples today. Let me know if this works well for you and we can send in a prescription  Continue Albuterol inhaler 2 puffs every 6 hours as needed for shortness of breath or wheezing. Notify if symptoms persist despite rescue inhaler/neb use.  Continue singulair 1 tab At bedtime    Continue to use CPAP every night, minimum of 4-6 hours a night.  Change to nasal pillow mask; can add chin strap if still having leaks with this   Follow up in 6 weeks with Dr. Silas Flood or Katie Alanta Scobey,NP. If symptoms do not improve or worsen, please contact office for sooner follow up or seek emergency care.

## 2022-01-25 NOTE — Progress Notes (Signed)
Reviewed and agree with assessment/plan.   Chesley Mires, MD [facility]monary[facility]Critical Care 01/25/2022, 12:43 PM Pager:  [phone]

## 2022-01-25 NOTE — Patient Instructions (Addendum)
Stop Symbicort. Start Stiolto 2 puffs daily - provided with samples today. Let me know if this works well for you and we can send in a prescription  Continue Albuterol inhaler 2 puffs every 6 hours as needed for shortness of breath or wheezing. Notify if symptoms persist despite rescue inhaler/neb use.  Continue singulair 1 tab At bedtime    Continue to use CPAP every night, minimum of 4-6 hours a night.  Change to nasal pillow mask; can add chin strap if still having leaks with this   Follow up in 6 weeks with Dr. Silas Flood or Katie Deondrick Searls,NP. If symptoms do not improve or worsen, please contact office for sooner follow up or seek emergency care.

## 2022-01-25 NOTE — Assessment & Plan Note (Signed)
Mild to moderate obstructive disease.  He was previously started on Symbicort but had difficulty with throat irritation and voice hoarseness from ICS even with decreased dosing.  Still having symptoms of dyspnea and bronchospasm.  Recommended that we try him on a LABA/LAMA therapy.  He was agreeable to this plan.  Teach back performed on Stiolto.  Asthma action plan in place.  Continue as needed albuterol.

## 2022-02-07 DIAGNOSIS — L720 Epidermal cyst: Secondary | ICD-10-CM | POA: Diagnosis not present

## 2022-02-07 DIAGNOSIS — L814 Other melanin hyperpigmentation: Secondary | ICD-10-CM | POA: Diagnosis not present

## 2022-02-07 DIAGNOSIS — L570 Actinic keratosis: Secondary | ICD-10-CM | POA: Diagnosis not present

## 2022-02-07 DIAGNOSIS — L821 Other seborrheic keratosis: Secondary | ICD-10-CM | POA: Diagnosis not present

## 2022-02-15 ENCOUNTER — Ambulatory Visit: Payer: Medicare Other | Attending: Internal Medicine | Admitting: *Deleted

## 2022-02-15 DIAGNOSIS — Z9889 Other specified postprocedural states: Secondary | ICD-10-CM

## 2022-02-15 DIAGNOSIS — I71019 Dissection of thoracic aorta, unspecified: Secondary | ICD-10-CM | POA: Diagnosis not present

## 2022-02-15 LAB — POCT INR: INR: 4.1 — AB (ref 2.0–3.0)

## 2022-02-15 NOTE — Patient Instructions (Addendum)
Description   Do not take any warfarin today and take 1/2 tablet tomorrow then continue taking 1 tablet daily except 1/2 tablet each Sunday.  Repeat INR in 3 weeks with MD Appt.  Anticoagulation Clinic [phone]

## 2022-02-17 DIAGNOSIS — M9904 Segmental and somatic dysfunction of sacral region: Secondary | ICD-10-CM | POA: Diagnosis not present

## 2022-02-17 DIAGNOSIS — M9905 Segmental and somatic dysfunction of pelvic region: Secondary | ICD-10-CM | POA: Diagnosis not present

## 2022-02-17 DIAGNOSIS — M9903 Segmental and somatic dysfunction of lumbar region: Secondary | ICD-10-CM | POA: Diagnosis not present

## 2022-02-17 DIAGNOSIS — M5137 Other intervertebral disc degeneration, lumbosacral region: Secondary | ICD-10-CM | POA: Diagnosis not present

## 2022-02-20 DIAGNOSIS — R0683 Snoring: Secondary | ICD-10-CM | POA: Diagnosis not present

## 2022-02-20 DIAGNOSIS — G4733 Obstructive sleep apnea (adult) (pediatric): Secondary | ICD-10-CM | POA: Diagnosis not present

## 2022-03-01 DIAGNOSIS — M5137 Other intervertebral disc degeneration, lumbosacral region: Secondary | ICD-10-CM | POA: Diagnosis not present

## 2022-03-01 DIAGNOSIS — M9903 Segmental and somatic dysfunction of lumbar region: Secondary | ICD-10-CM | POA: Diagnosis not present

## 2022-03-01 DIAGNOSIS — M9905 Segmental and somatic dysfunction of pelvic region: Secondary | ICD-10-CM | POA: Diagnosis not present

## 2022-03-01 DIAGNOSIS — M9904 Segmental and somatic dysfunction of sacral region: Secondary | ICD-10-CM | POA: Diagnosis not present

## 2022-03-03 DIAGNOSIS — M9904 Segmental and somatic dysfunction of sacral region: Secondary | ICD-10-CM | POA: Diagnosis not present

## 2022-03-03 DIAGNOSIS — M9905 Segmental and somatic dysfunction of pelvic region: Secondary | ICD-10-CM | POA: Diagnosis not present

## 2022-03-03 DIAGNOSIS — M5137 Other intervertebral disc degeneration, lumbosacral region: Secondary | ICD-10-CM | POA: Diagnosis not present

## 2022-03-03 DIAGNOSIS — M9903 Segmental and somatic dysfunction of lumbar region: Secondary | ICD-10-CM | POA: Diagnosis not present

## 2022-03-08 ENCOUNTER — Encounter: Payer: Self-pay | Admitting: Pulmonary Disease

## 2022-03-08 ENCOUNTER — Ambulatory Visit: Payer: Medicare Other | Admitting: Pulmonary Disease

## 2022-03-08 ENCOUNTER — Telehealth: Payer: Self-pay | Admitting: *Deleted

## 2022-03-08 VITALS — BP 126/70 | HR 71 | Ht 70.0 in | Wt 183.0 lb

## 2022-03-08 DIAGNOSIS — G4733 Obstructive sleep apnea (adult) (pediatric): Secondary | ICD-10-CM

## 2022-03-08 NOTE — Patient Outreach (Signed)
  Care Coordination   Initial Visit Note   03/08/2022 Name: Patrick Bell MRN: [id number] DOB: 1940-08-26  Patrick Bell is a 81 y.o. year old male who sees Dettinger, Fransisca Kaufmann, MD for primary care. I spoke with  Patrick Bell by phone today.  What matters to the patients health and wellness today?  No needs!  SDOH assessments and interventions completed:  Yes  SDOH Interventions Today    Flowsheet Row Most Recent Value  SDOH Interventions   Food Insecurity Interventions Intervention Not Indicated  Housing Interventions Intervention Not Indicated  Transportation Interventions Intervention Not Indicated  Utilities Interventions Intervention Not Indicated        Care Coordination Interventions:  No, not indicated   Follow up plan: No further intervention required.   Encounter Outcome:  Pt. Visit Completed   Patrick Bell Neither, MSN, [facility] Gerontological Nurse Practitioner [facility] Care Management [phone]

## 2022-03-09 ENCOUNTER — Ambulatory Visit: Payer: Medicare Other | Attending: Cardiovascular Disease | Admitting: Cardiovascular Disease

## 2022-03-09 ENCOUNTER — Ambulatory Visit (INDEPENDENT_AMBULATORY_CARE_PROVIDER_SITE_OTHER): Payer: Medicare Other | Admitting: *Deleted

## 2022-03-09 ENCOUNTER — Encounter: Payer: Self-pay | Admitting: Cardiovascular Disease

## 2022-03-09 DIAGNOSIS — I71019 Dissection of thoracic aorta, unspecified: Secondary | ICD-10-CM

## 2022-03-09 DIAGNOSIS — Z7901 Long term (current) use of anticoagulants: Secondary | ICD-10-CM

## 2022-03-09 DIAGNOSIS — Z9889 Other specified postprocedural states: Secondary | ICD-10-CM

## 2022-03-09 DIAGNOSIS — E785 Hyperlipidemia, unspecified: Secondary | ICD-10-CM

## 2022-03-09 DIAGNOSIS — I10 Essential (primary) hypertension: Secondary | ICD-10-CM | POA: Diagnosis not present

## 2022-03-09 DIAGNOSIS — G4733 Obstructive sleep apnea (adult) (pediatric): Secondary | ICD-10-CM

## 2022-03-09 DIAGNOSIS — I480 Paroxysmal atrial fibrillation: Secondary | ICD-10-CM

## 2022-03-09 LAB — POCT INR: INR: 3.4 — AB (ref 2.0–3.0)

## 2022-03-09 MED ORDER — AMLODIPINE BESYLATE 10 MG PO TABS: 10.0000 mg | ORAL_TABLET | Freq: Every day | ORAL | 3 refills | Status: DC

## 2022-03-09 MED ORDER — ROSUVASTATIN CALCIUM 20 MG PO TABS: 20.0000 mg | ORAL_TABLET | Freq: Every day | ORAL | 3 refills | Status: DC

## 2022-03-09 NOTE — Progress Notes (Signed)
Cardiology Office Note    Date:  03/20/2022   ID:  MANCIL PFENNING, DOB 1940/09/02, MRN [id number]  PCP:  Dettinger, Fransisca Kaufmann, MD  Cardiologist:  Shelva Majestic, MD   6 month F/U evaluation   History of Present Illness:  Patrick Bell is a 81 y.o. male who has a history of hypertension, hyperlipidemia, asthma as well as a history of obstructive sleep apnea initially diagnosed in 2016 .  At that time his AHI was 29.5, supine AHI, however was 56.9 and O2 desaturation was 82%.  He initially used CPAP but essentially stopped using therapy for over 3 years.  He has a history of hypertension and remotely was on amlodipine but also has been off treatment for at least a year.  When I saw him for initial evaluation he had recently started to notice blood pressure elevation as well as mild shortness of breath.  He was recently evaluated by Dr. Warrick Parisian and referred for an echo Doppler study on November 25, 2019 which revealed normal LV function with an EF of 60 to 65% with indeterminant diastolic parameters.  RV systolic function was normal.  There was mild LA dilation.  He was felt to have severe mitral regurgitation with moderate holosystolic prolapse of the middle scallop of the posterior leaflet of the mitral valve.  There was moderate mitral annular calcification in the severe MR had eccentric anteriorly directed jet.  There was no evidence for mitral stenosis.   He was referred for cardiology evaluation.    When I initially saw him he denied any awareness of rhythm abnormality.  He was sleeping poorly and was unable to sleep on his back.  An Epworth Sleepiness Scale score calculated in the office was significantly increased at 18 consistent with excessive daytime sleepiness.  At my initial evaluation, I recommended that he undergo a transesophageal echocardiogram to assess his severe mitral regurgitation and possible potential flail leaflet.  I also recommended he undergo a sleep study.   The patient  subsequently underwent TEE which confirmed the presence of mitral valve prolapse with flail segment involving a portion of the posterior leaflet and severe mitral regurgitation.   He was subsequently seen by Almyra Deforest, PA and was referred to Dr. Darylene Price for surgical consultation.  He saw Dr. Roxy Manns on January 09, 2020 who felt he was a good candidate for mitral valve repair.  He subsequently has undergone CT imaging studies and was referred for right and left heart cardiac catheterization prior to planned surgery in early December.   He underwent his diagnostic polysomnogram on January 30, 2020 but did not meet split-night criteria and so his initial study was only diagnostic polysomnogram.  This reconfirmed moderate overall sleep apnea with an AHI of 23.1/h; however, sleep apnea was very severe in the supine position with an AHI of 52.2/h.   He subsequently underwent cardiac catheterization by me on February 21, 2020 prior to his planned valve surgery with Dr. Roxy Manns.  He had normal right heart pressures with a PA mean at 20 mm.  Coronary arteries were normal.  LVEDP was 16 mm.  He underwent elective mitral valve repair with complex valvuloplasty by Dr. Roxy Manns on March 11, 2020.  The procedure was complicated by an acute type A aortic dissection requiring graft repair of his aorta and resuspension of his native aortic valve.  His hospital course was further complicated by pericardial effusion requiring pericardial window, atrial fibrillation treated with amiodarone and Coumadin, anemia requiring transfusion,  AKA, hypotension as well as a new diagnosis of hypothyroidism.  After his pericardial window he converted back to sinus rhythm.  He was discharged to inpatient rehab on March 31, 2020 and was ultimately discharged from the rehab unit on April 08, 2019.  He was seen in follow-up by CT surgery on April 13, 2020 and at that time had complaints of unintentional weight loss with decreased appetite  fatigability and weakness.  He was eating smaller meals.  He was having difficulty with significant fatigability and daytime sleepiness.  There was concern that his hypothyroidism may be playing a role and he was encouraged to follow-up with his PCP.  He was evaluated by [name], PA in a telemedicine visit on April 21, 2020.  At that time, his blood pressure was controlled.  Creatinine was stable at 1.52 as was his anemia with a hemoglobin of 8.8.  I saw him for his initial post operative evaluation with me on May 11, 2020.  At that time he denied any chest pain or awareness of palpitations.  I reviewed his echo Doppler study from May 07, 2020 which showed an EF of 60 to 65%.  There was normal pulmonary artery systolic pressure.  There was moderate left atrial dilatation and mild dilation of his right atrium.  There was a 32 mm prosthetic annuloplasty ring in the mitral position with trivial MR.  The mean mitral gradient was 4.  Aortic sclerosis without stenosis.  He was significantly fatigued.  He was experiencing frequent nocturia up to 4-5 times per night and often was taking 2 naps per day.  He denies any chest pain or arrhythmia.  He is not sleeping well.  He is to start cardiac rehab.  He is always tired.  He has nocturia at least 4-5 times per night and was  taking 2 naps per day.  During that evaluation, reviewed his sleep study was done on January 30, 2020 due to his cardiac surgery he never underwent follow-up titration.  He had moderate overall sleep apnea with an AHI of 23.1 and O2 nadir at 83%.  At that time, since he had a CPAP ResMed air sense 10 AutoSet unit with an original set up date in February 2016 I recommended AutoPap at 7 to 20 cm.  He  was seen by Dr. Roxy Manns on May 25, 2020 and was felt to be doing well 3 months status post mitral valve repair complicated by his acute type a aortic dissection requiring surgical repair.  He subsequently underwent CT angiography on June 12, 2020 of his chronic dissection involving the remaining transverse and descending thoracic aorta as well as abdominal aorta.  CT imaging revealed persistent aortic dissection flap through the arch, descending thoracic, abdominal aorta, bilateral iliac arteries, and left common femoral artery.  The dissection flap involves bilateral subclavian arteries, celiac, SMA, and bilateral renal arteries.  I saw him for reevaluation on July 14, 2020.  Since his prior evaluation he had  initiated CPAP therapy.  He has felt improved since initiating treatment.  A download was obtained from February 10 through June 12, 2020 which shows usage at only 50% of days with usage at 3 hours and 22 minutes.  His CPAP auto range was set initially at 7 to 20 cm and his 95% average was 14.8 with a maximum average of 16.4.  Presently, he is sleeping better.  He continues to be fatigued.  He had undergone repeat laboratory and renal function has improved with  a creatinine of 1.64 on April 27, 2020 to July 06, 2020 creatinine at 1.19.  He was unaware of any arrhythmias.  During his evaluation, I reviewed his recent CT with him in detail as noted above.  His blood pressure was elevated and I added amlodipine 5 mg to his medical regimen for more optimal blood pressure control and with his chronic dissection recommended titration of metoprolol succinate 25 mg up to 50 mg daily.  He was on warfarin and he recently had self regulated his dose and close INR follow-up was recommended.  He was referred to the emergency room for apparent bilateral blood pressure differential on September 24, 2020.  A follow-up CT of abdomen and pelvis was performed which again confirmed grossly stable thoracic aortic dissection that includes the transverse arch, descending thoracic aorta and extends through the abdominal aorta into the bilateral common and external iliac arteries and left common femoral artery.  Dissection flap was also seen involving the left  subclavian artery superior mesenteric and both renal arteries.  He was evaluated in a telemedicine phone evaluation with Dr. Roxy Manns on September 24, 2020 so reviewed CT findings.  He is aware that Dr. Roxy Manns is leaving the [place] community this month.  It was advised that he have 97-monthfollow-up CT imaging.  I last saw him in July 2022.  Since his prior evaluation he had essentially stopped using CPAP therapy and also self discontinued his levothyroxine.  O over the prior week he also  self discontinued his metoprolol succinate.  He has been on doxy[facility] and is completing a doxycycline course.  He was concerned about his need to continue warfarin.  He also continues to experience fatigue but has not been using CPAP but believes he is sleeping okay.  During that evaluation, I again discussed potential adverse cardiovascular consequences associated with not using his CPAP and in particular the increased risk for recurrent atrial fibrillation.  With his extensive aortic dissection I stressed with him the importance of optimally treating his blood pressure and recommended he resume metoprolol succinate 50 mg daily with target blood pressure ideally less than 120/80.  I also resume levothyroxine 50 mcg daily.  Since I last saw him, he was evaluated by Dr. BCyndia Benton May 19, 2021 his most recent echo had shown an EF of 60 to 65% with mild MR and AR.  The CTA of his chest/abdomen/pelvis in March 05, 2021 showed stable ascending aortic repair and the residual dissection flap was visible just beyond the repair extending through the aortic arch and down into the abdominal aorta into the bilateral iliac arteries and left common femoral artery.  The dissection flap extended into the SMA and bilateral renal arteries.  Optimal blood pressure was again stressed.  He had undergone a repeat sleep study on January 26, 2021 which again showed moderate obstructive sleep apnea with AHI 25.3 but  sleep apnea was severe with supine sleep with an AHI of 35.3 and during REM sleep with an AHI of 42.7.  CPAP was initiated at 6 cm and titrated to 18 cm of water.  He received a new ResMed AirSense 11 AutoSet unit on July 21, 2021.  A download from April 25 through Aug 25, 2021 showed that he was just meeting compliance standards at 70% use and usage greater than 4 hours.  However daily use still remains suboptimal at only 4 hours and 47 minutes.  At a pressure range of 16 to 20  cm, AHI is 4.8-hour.  I last saw him on September 07, 2021 at which time he denied chest pain.  His blood pressure had improved and he was  back on amlodipine 5 mg, metoprolol succinate 50 mg daily.  He is on simvastatin 10 mg at bedtime and is anticoagulated on warfarin.  He is on levothyroxine for hypothyroidism.  He was being followed by Dr. Cyndia Bent in follow-up of his extensive thoracic abdominal and pelvic aortic dissection.  Presently, he is feeling well and denies any exertional chest pain symptoms.  He did experience 2 episodes of short-lived mild chest sensation while sitting in a chair, not exertional.  At times he admits to some mild dizziness.  His blood pressure has remained relatively stable.  He has continued to be on amlodipine which she is taking 5 mg twice a day, levothyroxine 50 mcg daily, metoprolol succinate 50 mg.  He is on warfarin for anticoagulation.  He is on simvastatin 10 mg for hyperlipidemia and pantoprazole for GERD.  He underwent a follow-up sleep study interpreted by Dr. Halford Chessman and a recent trial of 11 cm water pressure of CPAP therapy was recommended.  He presents for cardiology follow-up evaluation.   Past Medical History:  Diagnosis Date   Acute thoracic aortic dissection (HCC) 03/11/2020   Type A   Allergy    Asthma    Cataract    Cataract    Diverticulitis    Dyspnea    GERD (gastroesophageal reflux disease)    Heart murmur 12/2019   Hyperlipidemia    Hypertension    Inguinal hernia  bilateral, non-recurrent    S/P aortic dissection repair 03/11/2020   supracoronary straight graft repair with resuspension of native aortic valve and open distal anastomosis for intraoperative acute type A aortic dissection    S/P mitral valve repair 03/11/2020   Complex valvuloplasty including artificial Gore-tex neochord placement x 4, suture plication of posterior leaflet and posteromedial commissure and 32 mm Sorin Memo 4D ring annuloplasty   Sleep apnea     Past Surgical History:  Procedure Laterality Date   bilateral inguinal hernia     CARDIAC CATHETERIZATION Bilateral 02/21/2020   CHEST TUBE INSERTION N/A 03/22/2020   Procedure: CHEST TUBE INSERTION OF 28 BLAKE DRAIN.;  Surgeon: Grace Isaac, MD;  Location: [place] [facility];  Service: Thoracic;  Laterality: N/A;   COLONOSCOPY  06/09/2004   DGL:OVFIEPP colon diverticulosis, otherwise normal colonoscopy   EYE SURGERY  08/2014   HERNIA REPAIR     MITRAL VALVE REPAIR N/A 03/11/2020   Procedure: MITRAL VALVE REPAIR (MVR) USING MEMO 4D SIZE 32 RING;  Surgeon: Rexene Alberts, MD;  Location: [place];  Service: Open Heart Surgery;  Laterality: N/A;   PALATE / UVULA BIOPSY / EXCISION     PERICARDIAL WINDOW N/A 03/22/2020   Procedure: SUBXYPHOID POST-OP PERICARDIAL FLUID DRAINAGE WITH CHEST TUBE INSERTION.;  Surgeon: Grace Isaac, MD;  Location: [place];  Service: Thoracic;  Laterality: N/A;   REPAIR OF ACUTE ASCENDING THORACIC AORTIC DISSECTION N/A 03/11/2020   Procedure: REPAIR OF ACUTE ASCENDING THORACIC AORTIC DISSECTION USING A HEMASHIELD PLATINUM 26MM STRAIGHT GRAFT AND A HEASHIELD PLATINUM 28X10MM SINGLE ARM GRAFT;  Surgeon: Rexene Alberts, MD;  Location: [place];  Service: Open Heart Surgery;  Laterality: N/A;   RIGHT/LEFT HEART CATH AND CORONARY ANGIOGRAPHY N/A 02/21/2020   Procedure: RIGHT/LEFT HEART CATH AND CORONARY ANGIOGRAPHY;  Surgeon: Troy Sine, MD;  Location: [place] [facility];  Service: Cardiovascular;  Laterality:  N/A;    TEE WITHOUT CARDIOVERSION N/A 12/19/2019   Procedure: TRANSESOPHAGEAL ECHOCARDIOGRAM (TEE);  Surgeon: Buford Dresser, MD;  Location[facility] ENDOSCOPY;  Service: Cardiovascular;  Laterality: N/A;   TEE WITHOUT CARDIOVERSION N/A 03/11/2020   Procedure: TRANSESOPHAGEAL ECHOCARDIOGRAM (TEE);  Surgeon: Rexene Alberts, MD;  Location: [place];  Service: Open Heart Surgery;  Laterality: N/A;   TEE WITHOUT CARDIOVERSION N/A 03/22/2020   Procedure: TRANSESOPHAGEAL ECHOCARDIOGRAM (TEE);  Surgeon: Grace Isaac, MD;  Location: [facility] OR;  Service: Thoracic;  Laterality: N/A;   TONSILLECTOMY     VASECTOMY      Current Medications: Outpatient Medications Prior to Visit  Medication Sig Dispense Refill   acetaminophen (TYLENOL) 325 MG tablet Take 1-2 tablets (325-650 mg total) by mouth every 4 (four) hours as needed for mild pain.     albuterol (VENTOLIN HFA) 108 (90 Base) MCG/ACT inhaler INHALE 2 PUFFS EVERY 6 HOURS AS NEEDED FOR WHEEZING OR SHORTNESS OF BREATH 8.5 g 2   guaiFENesin (MUCINEX) 600 MG 12 hr tablet Take 600 mg by mouth every 4 (four) hours as needed for cough or to loosen phlegm.     ipratropium (ATROVENT) 0.03 % nasal spray      levothyroxine (SYNTHROID) 50 MCG tablet Take 1 tablet (50 mcg total) by mouth daily. 90 tablet 3   metoprolol succinate (TOPROL-XL) 50 MG 24 hr tablet Take 1 tablet by mouth at night 90 tablet 3   montelukast (SINGULAIR) 10 MG tablet TAKE 1 TABLET DAILY 90 tablet 3   Olopatadine HCl 0.2 % SOLN Place 1 drop into both eyes daily as needed (allergies).     pantoprazole (PROTONIX) 40 MG tablet TAKE 1 TABLET DAILY 90 tablet 3   warfarin (COUMADIN) 5 MG tablet TAKE ONE TABLET ONCE DAILY AT 4PM 90 tablet 3   amLODipine (NORVASC) 5 MG tablet Take 1 tablet (5 mg total) by mouth 2 (two) times daily. 180 tablet 3   simvastatin (ZOCOR) 10 MG tablet TAKE ONE TABLET AT BEDTIME 90 tablet 3   Tiotropium Bromide-Olodaterol (STIOLTO RESPIMAT) 2.5-2.5 MCG/ACT AERS Inhale 2 puffs  into the lungs daily. 2 each 0   No facility-administered medications prior to visit.     Allergies:   Patient has no known allergies.   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  Tobacco Use   Smoking status: Former    Packs/day: 4.00    Years: 25.00    Total pack years: 100.00    Types: Cigarettes    Quit date: 06/23/1982    Years since quitting: 39.7   Smokeless tobacco: Never   Tobacco comments:    pt was a truck Land Use: Never used  Substance and Sexual Activity   Alcohol use: Yes    Alcohol/week: 5.0 standard drinks of alcohol    Types: 5 Shots of liquor per week    Comment: brandy a few times a month, beer once a month   Drug use: No   Sexual activity: Yes    Birth control/protection: Surgical  Other Topics Concern   Not on file  Social History Narrative   Not on file   Social Determinants of Health  Financial Resource Strain: Low Risk  (11/19/2021)   Overall Financial Resource Strain (CARDIA)    Difficulty of Paying Living Expenses: Not hard at all  Food Insecurity: No Food Insecurity (03/08/2022)   Hunger Vital Sign    Worried About Running Out of Food in the Last Year: Never true    Ran Out of Food in the Last Year: Never true  Transportation Needs: No Transportation Needs (03/08/2022)   PRAPARE - Hydrologist (Medical): No    Lack of Transportation (Non-Medical): No  Physical Activity: Sufficiently Active (11/19/2021)   Exercise Vital Sign    Days of Exercise per Week: 5 days    Minutes of Exercise per Session: 40 min  Stress: No Stress Concern Present (11/19/2021)   [place]    Feeling of  Stress : Not at all  Social Connections: [place] (11/19/2021)   Social Connection and Isolation Panel [NHANES]    Frequency of Communication with Friends and Family: More than three times a week    Frequency of Social Gatherings with Friends and Family: More than three times a week    Attends Religious Services: More than 4 times per year    Active Member of [employer] or Organizations: Yes    Attends Music therapist: More than 4 times per year    Marital Status: Married     Family History:  The patient's family history includes Arthritis in his brother, father, and sister; Dementia (age of onset: 81) in his mother; Heart disease in his father and mother; Hip fracture (age of onset: 8) in his mother; Osteoporosis in his mother.   ROS General: Negative; No fevers, chills, or night sweats;  HEENT: Negative; No changes in vision or hearing, sinus congestion, difficulty swallowing Pulmonary: Negative; No cough, wheezing, shortness of breath, hemoptysis Cardiovascular: see HPI GI: Negative; No nausea, vomiting, diarrhea, or abdominal pain GU: Negative; No dysuria, hematuria, or difficulty voiding Musculoskeletal: Negative; no myalgias, joint pain, or weakness Hematologic/Oncology: Negative; no easy bruising, bleeding Endocrine: Negative; no heat/cold intolerance; no diabetes Neuro: Negative; no changes in balance, headaches Skin: Negative; No rashes or skin lesions Psychiatric: Negative; No behavioral problems, depression Sleep: Positive for OSA with history of very severe sleep apnea with supine position with prior complaints of snoring, nocturia, daytime sleepiness and marked fatigability; nobruxism, restless legs, hypnogognic hallucinations, no cataplexy Other comprehensive 14 point system review is negative.   PHYSICAL EXAM:   VS:  BP 132/62   Pulse 62   Ht _0  (1.778 m)   Wt 183 lb 6.4 oz (83.2 kg)   SpO2 96%   BMI 26.32 kg/m     Repeat blood pressure by  me was 132/70  Wt Readings from Last 3 Encounters:  03/09/22 183 lb 6.4 oz (83.2 kg)  03/08/22 183 lb (83 kg)  01/25/22 183 lb 12.8 oz (83.4 kg)     General: Alert, oriented, no distress.  Skin: normal turgor, no rashes, warm and dry HEENT: Normocephalic, atraumatic. Pupils equal round and reactive to light; sclera anicteric; extraocular muscles intact;  Nose without nasal septal hypertrophy Mouth/Parynx benign; Mallinpatti scale 3 Neck: No JVD, no carotid bruits; normal carotid upstroke Lungs: clear to ausculatation and percussion; no wheezing or rales Chest wall: without tenderness to palpitation Heart: PMI not displaced, RRR, s1 s2 normal, 1/6 systolic murmur, no diastolic murmur, no rubs, gallops, thrills, or heaves Abdomen: soft, nontender; no hepatosplenomehaly, BS+; abdominal  aorta nontender and not dilated by palpation. Back: no CVA tenderness Pulses 2+ Musculoskeletal: full range of motion, normal strength, no joint deformities Extremities: no clubbing cyanosis or edema, Homan's sign negative  Neurologic: grossly nonfocal; Cranial nerves grossly wnl Psychologic: Normal mood and affect     Studies/Labs Reviewed:   March 09, 2022 ECG (independently read by me):   September 07, 2021 ECG (independently read by me): Sinus rhythm at 71, 1st degree AV block, inferior Q waves  October 06, 2020 ECG (independently read by me): Sinus rhythm at 91 bpm; first-degree AV block with a period of 1 to 30 ms.  Nonspecific ST-T wave abnormality.  Mildly prolonged QTc interval at 460 ms.   ECG (independently read by me):  NSR at 71; no significant STT changes; QTc 478 msec  Recent Labs:    Latest Ref Rng & Units 01/05/2022   10:56 AM 07/08/2021   11:04 AM 05/06/2021    8:17 AM  BMP  Glucose 70 - 99 mg/dL 85  84  87   BUN 8 - 27 mg/dL _0 Creatinine 0.76 - 1.27 mg/dL 1.05  1.12  1.12   BUN/Creat Ratio 10 - _1 Sodium 134 - 144 mmol/L 143  144  141   Potassium 3.5 - 5.2  mmol/L 4.3  4.3  4.2   Chloride 96 - 106 mmol/L 104  105  105   CO2 20 - 29 mmol/L _2 Calcium 8.6 - 10.2 mg/dL 9.6  9.0  9.2         Latest Ref Rng & Units 01/05/2022   10:56 AM 07/08/2021   11:04 AM 05/06/2021    8:17 AM  Hepatic Function  Total Protein 6.0 - 8.5 g/dL 6.2  5.8  6.1   Albumin 3.7 - 4.7 g/dL 4.2  4.3  4.2   AST 0 - 40 IU/L 34  19  17   ALT 0 - 44 IU/L _3 Alk Phosphatase 44 - 121 IU/L 61  63  69   Total Bilirubin 0.0 - 1.2 mg/dL 0.5  0.6  0.6        Latest Ref Rng & Units 01/05/2022   10:56 AM 07/08/2021   11:04 AM 05/06/2021    8:17 AM  CBC  WBC 3.4 - 10.8 x10E3/uL 4.0  5.0  4.7   Hemoglobin 13.0 - 17.7 g/dL 13.7  14.0  14.6   Hematocrit 37.5 - 51.0 % 41.2  43.6  45.3   Platelets 150 - 450 x10E3/uL 214  176  212    Lab Results  Component Value Date   MCV 84 01/05/2022   MCV 84 07/08/2021   MCV 84 05/06/2021   Lab Results  Component Value Date   TSH 1.160 01/05/2022   Lab Results  Component Value Date   HGBA1C 5.8 (H) 05/06/2021     BNP    Component Value Date/Time   BNP 112.7 (H) 09/10/2020 1154    ProBNP No results found for: "PROBNP"   Lipid Panel     Component Value Date/Time   CHOL 184 01/05/2022 1056   CHOL 168 06/18/2012 1031   TRIG 68 01/05/2022 1056   TRIG 101 01/14/2013 1218   TRIG 102 06/18/2012 1031   HDL 70 01/05/2022 1056   HDL 65 01/14/2013 1218   HDL 62 06/18/2012 1031   CHOLHDL 2.6 01/05/2022  1056   LDLCALC 101 (H) 01/05/2022 1056   [place] 99 01/14/2013 1218   LDLCALC 86 06/18/2012 1031   LABVLDL 13 01/05/2022 1056    INR today, subtherapeutic at 1.3.   RADIOLOGY: CTA CHEST/ABD/PEL DISSECTION W&/OR WO & GATING  Result Date: 03/18/2022 CLINICAL DATA:  81 year old with aortic dissection and status post repair of ascending thoracic aortic dissection. EXAM: CT ANGIOGRAPHY CHEST, ABDOMEN AND PELVIS TECHNIQUE: Non-contrast CT of the chest was initially obtained. Multidetector CT imaging through the  chest, abdomen and pelvis was performed using the standard protocol during bolus administration of intravenous contrast. Multiplanar reconstructed images and MIPs were obtained and reviewed to evaluate the vascular anatomy. RADIATION DOSE REDUCTION: This exam was performed according to the departmental dose-optimization program which includes automated exposure control, adjustment of the mA and/or kV according to patient size and/or use of iterative reconstruction technique. CONTRAST:  133m OMNIPAQUE IOHEXOL 350 MG/ML SOLN COMPARISON:  03/23/2021 FINDINGS: CTA CHEST FINDINGS Cardiovascular: Repair of the ascending thoracic aortic dissection with a graft. The graft is widely patent. Again noted is a dissection involving the aortic arch and extending into the descending thoracic aorta. Aortic arch on sequence 6, image 50 measures approximately 4.5 cm and previously measured 4.4 cm. Great vessels remain patent. Again noted is a dissection extending into the left subclavian artery. Left vertebral artery is patent. Left subclavian artery dissection does not extend into the left axillary artery. Proximal common carotid arteries are patent bilaterally. Evidence for small right vertebral artery. Proximal descending thoracic aorta measures 4.3 cm and stable. Mid descending thoracic aorta at the level of the main pulmonary artery measures 3.4 cm and stable. Distal descending thoracic aorta measures 2.6 cm and stable. Aortic dissection extends into the abdominal aorta. Stable appearance of the heart with previous mitral valve repair. Mediastinum/Nodes: No lymph node enlargement in the mediastinum or hila. Thyroid tissue is unremarkable. No axillary lymph node enlargement. Esophagus is unremarkable. Lungs/Pleura: Trachea and mainstem bronchi are patent. Stable elongated nodular density in the right middle lobe on sequence 8, image 116 measures up to 7 mm. Stable tiny nodular density in the right upper lobe on sequence 8, image  25. No airspace disease or consolidation in the lungs. Stable branching densities in the periphery of the left upper lobe on sequence 8, image 64. No large pleural effusions. Stable subtle nodular density in the left upper lobe on sequence 8, image 28. Musculoskeletal: No acute bone abnormality. Review of the MIP images confirms the above findings. CTA ABDOMEN AND PELVIS FINDINGS VASCULAR Aorta: Aortic dissection involving the entire abdominal aorta that extends into the iliac arteries bilaterally. Configuration of the abdominal aorta and dissection has not significantly changed. Supra celiac abdominal aorta measures 2.9 cm and stable. True lumen is along the left side of the proximal abdominal aorta. Celiac: Celiac trunk originates from the true lumen. Celiac trunk is patent without aneurysm or dissection. No significant stenosis. SMA: Dissection involving the origin the SMA but the flow appears to be predominantly from the true lumen. No significant stenosis or aneurysm involving the SMA. Renals: Dissection involves the proximal right renal artery. Dissection most likely involves the proximal left renal artery. This configuration is unchanged. Bilateral renal arteries are patent without significant stenosis or aneurysm. IMA: Originates from the false lumen and patent without aneurysm or significant stenosis. Inflow: Dissection extends into the common iliac arteries bilaterally. Dissection extends into the right external iliac artery and terminates in the mid/distal aspect and stable from the previous examination.  Left iliac dissection extends into the distal left external iliac artery and terminates in the proximal left common femoral artery region. Configuration and extent of the left iliac artery dissection has not significantly changed. Bilateral internal iliac arteries are patent and originating from the true lumens. Proximal femoral arteries are patent bilaterally. Veins: No obvious venous abnormality within  the limitations of this arterial phase study. Review of the MIP images confirms the above findings. NON-VASCULAR Hepatobiliary: Subtle hypodensity in the posterior right hepatic lobe on sequence 11, image 72 is nonspecific and likely an incidental finding. No biliary dilatation. Portal venous system is patent. Normal appearance of the gallbladder. Pancreas: Unremarkable. No pancreatic ductal dilatation or surrounding inflammatory changes. Spleen: Normal in size without focal abnormality. Adrenals/Urinary Tract: Normal adrenal glands. Stable appearance of both kidneys without hydronephrosis. Probable left renal sinus cyst which is similar to the previous examination. Evidence for small cortical renal cysts. These cysts do not require dedicated follow-up. No suspicious renal lesions. Normal appearance of the urinary bladder. Stomach/Bowel: Normal appearance of the stomach and small bowel. Colonic diverticulosis particularly in the sigmoid colon. Chronic densities along the origin of the left inguinal canal related to previous hernia repair. No evidence for acute colonic or bowel inflammation. Normal appendix without inflammatory changes. There is small bowel near the origin of the right inguinal canal. Lymphatic: No lymph node enlargement in the abdomen or pelvis. Reproductive: Prostate is upper limits of normal and stable the previous examination. Other: Negative for free fluid. Negative for free air. Evidence for bilateral inguinal hernia repairs. Musculoskeletal: Multilevel degenerative disc and facet disease in lumbar spine. No acute bone abnormality. Review of the MIP images confirms the above findings. IMPRESSION: 1. Stable appearance of the residual dissection involving the aortic arch, descending thoracic aorta, abdominal aorta and bilateral iliac arteries. No significant change in the size of the thoracic or abdominal aorta. Stable appearance of the surgically repaired ascending thoracic aorta. 2. No acute  abnormality involving the chest, abdomen or pelvis. 3. Colonic diverticulosis without acute inflammation. Electronically Signed   By: Markus Daft M.D.   On: 03/18/2022 10:52     Additional studies/ records that were reviewed today include:   R/L Heart Cath: 02/21/2020  Normal right heart pressures with PA mean pressure at 20 mmHg.   Normal coronary arteries.   LVEDP: 14mHg   RECOMMENDATION: Mr. RTeyton Pattillois an 81year old gentleman who was recently found to have a cardiac murmur.  He has subsequently been found to have severe mitral regurgitation in the setting of normal LV function with an EF of 60 to 65%.  He is felt to have severe prolapse involving the portion of the posterior leaflet of the mitral valve with at least one ruptured primary chordae tendineae with a flail segment causing severe mitral regurgitation.  Presently, right heart pressures are essentially normal with borderline increased PA systolic pressure at 31 mm.  Coronary anatomy is normal.  He has undergone CT angiography of his vasculature ordered by Dr. ORoxy Manns  He will be following up with Dr. ORoxy Mannsand is tentatively scheduled for mitral valve repair surgery on March 11, 2020.   ECHO 05/07/2020 IMPRESSIONS   1. Left ventricular ejection fraction, by estimation, is 60 to 65%. The  left ventricle has normal function. The left ventricle has no regional  wall motion abnormalities. Left ventricular diastolic function could not  be evaluated.   2. Right ventricular systolic function is normal. The right ventricular  size is normal. There is  normal pulmonary artery systolic pressure.   3. Left atrial size was moderately dilated.   4. Right atrial size was mildly dilated.   5. The mitral valve has been repaired/replaced. Trivial mitral valve  regurgitation. The mean mitral valve gradient is 4.0 mmHg. There is a 32  mm prosthetic annuloplasty ring present in the mitral position. Procedure  Date: 03/11/2020.   6. The aortic  valve is tricuspid. There is mild calcification of the  aortic valve. There is mild thickening of the aortic valve. Aortic valve  regurgitation is trivial.   7. The inferior vena cava is normal in size with greater than 50%  respiratory variability, suggesting right atrial pressure of 3 mmHg.    09/10/2020 FINDINGS: CTA CHEST FINDINGS Cardiovascular: Status post surgical repair of ascending thoracic aorta. Status post mitral valve repair. Stable appearance of residual dissection is seen that begins at the origin of the innominate artery and extends through the transverse aortic arch and descending thoracic aorta into the abdominal aorta. Stable dissection flap is seen extending into the left subclavian artery. Normal cardiac size. No pericardial effusion.   Mediastinum/Nodes: No enlarged mediastinal, hilar, or axillary lymph nodes. Thyroid gland, trachea, and esophagus demonstrate no significant findings.   Lungs/Pleura: No pneumothorax or pleural effusion is noted. 6 mm nodule is noted inferiorly in right middle lobe best seen on image number 114 of series 7. No consolidative process is noted.   Musculoskeletal: No chest wall abnormality. No acute or significant osseous findings.   Review of the MIP images confirms the above findings.   CTA ABDOMEN AND PELVIS FINDINGS   VASCULAR   Aorta: Thoracic aortic dissection noted on prior exam extends throughout the abdominal aorta and into the common and external iliac arteries bilaterally no definite aneurysm is noted.   Celiac: Patent without evidence of aneurysm, dissection, vasculitis or significant stenosis.   SMA: Stable dissection is seen involving the proximal portion of the superior mesenteric artery which is unchanged compared to prior exam.   Renals: Stable dissection is seen extending through the left renal artery. Stable dissection is seen involving the proximal portion of the right renal artery.   IMA: Patent,  but arises from the false lumen.   Inflow: As noted above, dissection is seen to extend through the bilateral common and external iliac arteries and into left common femoral artery. This is stable compared to prior exam.   Veins: No obvious venous abnormality within the limitations of this arterial phase study.   Review of the MIP images confirms the above findings.   NON-VASCULAR   Hepatobiliary: No focal liver abnormality is seen. No gallstones, gallbladder wall thickening, or biliary dilatation.   Pancreas: Unremarkable. No pancreatic ductal dilatation or surrounding inflammatory changes.   Spleen: Normal in size without focal abnormality.   Adrenals/Urinary Tract: Adrenal glands are unremarkable. Kidneys are normal, without renal calculi, focal lesion, or hydronephrosis. Bladder is unremarkable.   Stomach/Bowel: Stomach is within normal limits. Appendix appears normal. No evidence of bowel wall thickening, distention, or inflammatory changes. Diverticulosis of descending and sigmoid colon is noted without inflammation.   Lymphatic: No adenopathy is noted.   Reproductive: Prostate is unremarkable.   Other: No abdominal wall hernia or abnormality. No abdominopelvic ascites.   Musculoskeletal: No acute or significant osseous findings.   Review of the MIP images confirms the above findings.   IMPRESSION: Status post surgical repair of ascending thoracic aorta as well as mitral valve repair.   Grossly stable appearance thoracic aortic  dissection that includes the transverse aortic arch and descending thoracic aorta. It extends through the abdominal aorta and into bilateral common and external iliac arteries and left common femoral artery.   Dissection flap is seen involving the left subclavian artery, superior mesenteric artery and both renal arteries.   6 mm nodule is noted in right middle lobe. Non-contrast chest CT at 6-12 months is recommended. If the nodule  is stable at time of repeat CT, then future CT at 18-24 months (from today's scan) is considered optional for low-risk patients, but is recommended for high-risk patients. This recommendation follows the consensus statement: Guidelines for Management of Incidental Pulmonary Nodules Detected on CT Images: From the Fleischner Society 2017; Radiology 2017; 284:228-243.   01/26/2021 CLINICAL INFORMATION Sleep Study Type: Split Night CPAP   Indication for sleep study: Fatigue, Snoring   Epworth Sleepiness Score: 18   SLEEP STUDY TECHNIQUE As per the AASM Manual for the Scoring of Sleep and Associated Events v2.3 (April 2016) with a hypopnea requiring 4% desaturations.   The channels recorded and monitored were frontal, central and occipital EEG, electrooculogram (EOG), submentalis EMG (chin), nasal and oral airflow, thoracic and abdominal wall motion, anterior tibialis EMG, snore microphone, electrocardiogram, and pulse oximetry. Continuous positive airway pressure (CPAP) was initiated when the patient met split night criteria and was titrated according to treat sleep-disordered breathing.   MEDICATIONS acetaminophen (TYLENOL) 325 MG tablet albuterol (PROAIR HFA) 108 (90 Base) MCG/ACT inhaler amLODipine (NORVASC) 2.5 MG tablet guaiFENesin (MUCINEX) 600 MG 12 hr tablet levothyroxine (SYNTHROID) 50 MCG tablet metoprolol succinate (TOPROL-XL) 50 MG 24 hr tablet (Expired) montelukast (SINGULAIR) 10 MG tablet Olopatadine HCl 0.2 % SOLN pantoprazole (PROTONIX) 40 MG tablet simvastatin (ZOCOR) 10 MG tablet warfarin (COUMADIN) 5 MG tablet  Medications self-administered by patient taken the night of the study : N/A   RESPIRATORY PARAMETERS Diagnostic Total AHI (/hr):            25.3     RDI (/hr):         29.8     OA Index (/hr):            0.4       CA Index (/hr):      0.0 REM AHI (/hr):            52.7     NREM AHI (/hr):          20.4     Supine AHI (/hr):         35.3     Non-supine AHI  (/hr):        0 Min O2 Sat (%):          82.0     Mean O2 (%):  91.9     Time below 88% (min):           8.4          Titration Optimal Pressure (cm):           18        AHI at Optimal Pressure (/hr):            0          Min O2 at Optimal Pressure (%):       95.0 Supine % at Optimal (%):       0          Sleep % at Optimal (%):         62  SLEEP ARCHITECTURE The recording time for the entire night was 413.4 minutes.   During a baseline period of 167.0 minutes, the patient slept for 135.0 minutes in REM and nonREM, yielding a sleep efficiency of 80.8%%. Sleep onset after lights out was 15.2 minutes with a REM latency of 84.5 minutes. The patient spent 7.8%% of the night in stage N1 sleep, 77.0%% in stage N2 sleep, 0.0%% in stage N3 and 15.2% in REM.   During the titration period of 240.9 minutes, the patient slept for 185.0 minutes in REM and nonREM, yielding a sleep efficiency of 76.8%%. Sleep onset after CPAP initiation was 38.1 minutes with a REM latency of 15.5 minutes. The patient spent 3.5%% of the night in stage N1 sleep, 48.6%% in stage N2 sleep, 0.0%% in stage N3 and 47.8% in REM.   CARDIAC DATA The 2 lead EKG demonstrated sinus rhythm. The mean heart rate was 100.0 beats per minute. Other EKG findings include: None.   LEG MOVEMENT DATA The total Periodic Limb Movements of Sleep (PLMS) were 0. The PLMS index was 0.0 .   IMPRESSIONS - Moderate obstructive sleep apnea overall during the diagnostic portion of the study (AHI 25.3/h; RDI 29.8/h); however, sleep apnea was severe with supine sleep (AHI 35.3) and during REM sleep (AHI 52.7/h).   CPAP was initiated at 6 cm and was titrated to 18 cm of water; AHI 0, O2 nadir 95. - No significant central sleep apnea occurred during the diagnostic portion of the study (CAI  0.0/hour). - Moderate oxygen desaturation was noted during the diagnostic study to a nadir of 82%. - Snoring was audible during the diagnostic portion of the  study.  - No cardiac abnormalities were noted during this study. - Clinically significant periodic limb movements did not occur during sleep.   DIAGNOSIS - Obstructive Sleep Apnea (G47.33)   RECOMMENDATIONS - Recommend an initial trial of CPAP Auto with EPR of 3 at 17 - 20 cm H2O with heated humidification. A Large size Fisher&Paykel Full Face Mask Simplus mask was used for the titration.  - Effort should be made to optimize nasal and oropharyngeal patency.  - Avoid alcohol, sedatives and other CNS depressants that may worsen sleep apnea and disrupt normal sleep architecture. - Sleep hygiene should be reviewed to assess factors that may improve sleep quality. - Weight management and regular exercise should be initiated or continued. - Recommend a download and sleep clinic evaluation after 4 weeks of therapy.    ECHO: 04/15/2021  1. Left ventricular ejection fraction, by estimation, is 60 to 65%. Left  ventricular ejection fraction by 3D volume is 63 %. The left ventricle has  normal function. The left ventricle has no regional wall motion  abnormalities. There is mild asymmetric  left ventricular hypertrophy. Left ventricular diastolic parameters are  consistent with Grade I diastolic dysfunction (impaired relaxation).   2. Right ventricular systolic function is moderately reduced. The right  ventricular size is normal. There is normal pulmonary artery systolic  pressure.   3. Left atrial size was moderately dilated.   4. Right atrial size was mildly dilated.   5. The mitral valve has been repaired/replaced. Mild mitral valve  regurgitation.   6. The aortic valve is grossly normal. Aortic valve regurgitation is  mild.   ASSESSMENT:    1. Primary hypertension   2. Chronic aortic dissection ([place]); Aortic arch, thoracic, abdominal, pelvic   3. S/P mitral valve repair   4. OSA (obstructive sleep apnea)   5.  PAF (paroxysmal atrial fibrillation) ([place])   6. Chronic anticoagulation    7. Hyperlipidemia LDL goal <70     PLAN:  [name] is an 81 year old gentleman who has a history of hypertension, and was found to have severe mitral valve prolapse with a flail segment resulting in severe mitral regurgitation.  He had a remote history of sleep apnea which was very severe during supine sleep and had stopped using CPAP therapy over 4 years ago.  He was found to have normal coronary arteries and was admitted for elective mitral valve repair with complex valvuloplasty. His operation was complicated by acute type A aortic dissection necessitating graft repair of his aorta and resuspension of his native aortic valve.  He also had further complications with pericardial effusion requiring pericardial window, atrial fibrillation, anemia, AKA, and also was diagnosed with hypothyroidism.  An echo of May 07, 2020 showed an EF of 60 to 65%.  The left atrium was moderately dilated and the right atrium was mildly dilated.  There was trivial MR.  Subsequently, he has remained relatively stable regarding his complex valve surgery and complications and is now followed by Dr. Cyndia Bent with Dr. Ricard Dillon departure.  When I saw him for my initial post hospital evaluation with me he was having significant fatigability, was experiencing nocturia at least 4-5 times per night and was requiring at least 2 naps per day.  At that time I reviewed his sleep study from October 2021 revealed moderate overall sleep apnea but this was very severe during REM sleep with an AHI at 52.2/h.  Because of his cardiac surgery he never had CPAP titration.  I instituted AutoPap therapy his initial visit with me in February.  At follow-up in April 2022, I reviewed his most recent download increased his pressure settings since his 95th percentile pressure is 14.8 and maximum average pressure is 60.4.  I reduced his ramp time to 15 minutes with ramp start increasing to 6 cm of water and changed his auto mode to 11 up to 18 cm of  water.  Rwnal function is improved with creatinine now 1.19 compared to 1.64.  His CT of his aorta shows extensive chronic dissection through the arch, descending thoracic, abdominal aorta, bilateral iliac arteries and left common femoral artery.  There also was a dissection flap involving the bilateral subclavian arteries, celiac, SMA and bilateral renal arteries.  During his April 2022 evaluation his blood pressure was elevated and amlodipine 5 mg was added to his medical regimen for optimal blood pressure control and I further titrated metoprolol succinate to 50 mg.  At a subsequent evaluation he had completely discontinued CPAP therapy and was also self discontinuing several of his cardiac medications.  When last seen by me in July 2022 off beta-blocker his heart rate had significantly increased to the 90-100 range and metoprolol succinate was reinstituted and he was advised to further titrate amlodipine for more optimal blood pressure control.  He agreed to undergo reevaluation of his sleep apnea and I thoroughly reviewed this with him today.  On his sleep study on January 26, 2021 he had moderate overall sleep apnea 25.3 but sleep apnea was severe with supine sleep with an AHI of 35.3 and more severe during REM sleep with an AHI of 52.7.  O2 decreased to 82%.  He reinitiated CPAP therapy with initial pressure range at 16 to 20 cm of water.  Although he is technically meeting compliance standards he is still not using CPAP with adequate  duration per night averaging only 4 hours and 47 minutes.  On previous evaluation I discussed with him the importance of using CPAP for the entire nights duration particularly since the preponderance of REM sleep occurs in the second half of the night and with his very severe sleep apnea it is most important that he use therapy for the nights duration and I discussed sleep apnea during REM sleep is also associated with increased risk for resistant hypertension.  Apparently, he  also sees Dr. Halford Chessman and underwent a reevaluation on December 05, 2021 an initial trial of CPAP therapy at 11 cm was recommended.  Presently, Mr. Orner blood pressure was relatively stable.  I have recommended that since amlodipine has a whole long half-life he can change his regimen from 5 twice a day to 10 mg in the morning.  He is on warfarin for anticoagulation and INR today is 3.4.  LDL cholesterol when checked in October 2023 was elevated at 101.  I recommended he discontinue simvastatin and in its place initiate rosuvastatin 20 mg for more aggressive lipid management.  I also have recommended a comprehensive reevaluation of his previous thoracic abdominal and pelvic aortic dissection following his surgery and we will schedule him for CT imaging.  He will be following up with Dr. Cyndia Bent.  I will contact him regarding the results of the above studies.  His primary physician is Dr. Warrick Parisian who he will see this spring.  I will see him in 6 months for follow-up Cardiologic evaluation or sooner as needed.   Medication Adjustments/Labs and Tests Ordered: Current medicines are reviewed at length with the patient today.  Concerns regarding medicines are outlined above.  Medication changes, Labs and Tests ordered today are listed in the Patient Instructions below. Patient Instructions  Medication Instructions:  Your physician has recommended you make the following change in your medication:  INCREASE: Amlodipine 72m daily  STOP: Simvastatin START: Rosuvastatin 26mdaily  *If you need a refill on your cardiac medications before your next appointment, please call your pharmacy*   Lab Work: NONE If you have labs (blood work) drawn today and your tests are completely normal, you will receive your results only by: MySheldonif you have MyChart) OR A paper copy in the mail If you have any lab test that is abnormal or we need to change your treatment, we will call you to review the  results.   Testing/Procedures: Non-Cardiac CT scanning, (CAT scanning), is a noninvasive, special x-ray that produces cross-sectional images of the body using x-rays and a computer. CT scans help physicians diagnose and treat medical conditions. For some CT exams, a contrast material is used to enhance visibility in the area of the body being studied. CT scans provide greater clarity and reveal more details than regular x-ray exams.    Follow-Up: [facility] are our priority.  As part of our continuing mission to provide you with exceptional heart care, we have created designated Provider Care Teams.  These Care Teams include your primary Cardiologist (physician) and Advanced Practice Providers (APPs -  Physician Assistants and Nurse Practitioners) who all work together to provide you with the care you need, when you need it.  We recommend signing up for the patient portal called "MyChart".  Sign up information is provided on this After Visit Summary.  MyChart is used to connect with patients for Virtual Visits (Telemedicine).  Patients are able to view lab/test results, encounter notes,  upcoming appointments, etc.  Non-urgent messages can be sent to your provider as well.   To learn more about what you can do with MyChart, go to [email or web address].    Your next appointment:   6 month(s)  The format for your next appointment:   In Person  Provider:   Shelva Majestic, MD    Please can we get this patient scheduled to see Gilford Raid in January.    Signed, Shelva Majestic, MD  03/20/2022 10:54 AM    [facility] [address], [place], [place], [place]  [address] Phone: [phone]

## 2022-03-09 NOTE — Patient Instructions (Signed)
Description   Do not take any warfarin today then start taking 1 tablet daily except 1/2 tablet each Sundays and Wednesdays. Repeat INR in 4 weeks per request.  Anticoagulation Clinic [phone]

## 2022-03-09 NOTE — Patient Instructions (Signed)
Medication Instructions:  Your physician has recommended you make the following change in your medication:  INCREASE: Amlodipine '10mg'$  daily  STOP: Simvastatin START: Rosuvastatin '20mg'$  daily  *If you need a refill on your cardiac medications before your next appointment, please call your pharmacy*   Lab Work: NONE If you have labs (blood work) drawn today and your tests are completely normal, you will receive your results only by: [place] (if you have MyChart) OR A paper copy in the mail If you have any lab test that is abnormal or we need to change your treatment, we will call you to review the results.   Testing/Procedures: Non-Cardiac CT scanning, (CAT scanning), is a noninvasive, special x-ray that produces cross-sectional images of the body using x-rays and a computer. CT scans help physicians diagnose and treat medical conditions. For some CT exams, a contrast material is used to enhance visibility in the area of the body being studied. CT scans provide greater clarity and reveal more details than regular x-ray exams.    Follow-Up: At [facility], you and your health needs are our priority.  As part of our continuing mission to provide you with exceptional heart care, we have created designated Provider Care Teams.  These Care Teams include your primary Cardiologist (physician) and Advanced Practice Providers (APPs -  Physician Assistants and Nurse Practitioners) who all work together to provide you with the care you need, when you need it.  We recommend signing up for the patient portal called "MyChart".  Sign up information is provided on this After Visit Summary.  MyChart is used to connect with patients for Virtual Visits (Telemedicine).  Patients are able to view lab/test results, encounter notes, upcoming appointments, etc.  Non-urgent messages can be sent to your provider as well.   To learn more about what you can do with MyChart, go to [email or web address].     Your next appointment:   6 month(s)  The format for your next appointment:   In Person  Provider:   Shelva Majestic, MD    Please can we get this patient scheduled to see Gilford Raid in January.

## 2022-03-12 DIAGNOSIS — G4733 Obstructive sleep apnea (adult) (pediatric): Secondary | ICD-10-CM | POA: Diagnosis not present

## 2022-03-17 ENCOUNTER — Ambulatory Visit (HOSPITAL_COMMUNITY)
Admission: RE | Admit: 2022-03-17 | Discharge: 2022-03-17 | Disposition: A | Discharge status: Home / Self Care | Payer: Medicare Other | Source: Ambulatory Visit | Attending: Cardiovascular Disease | Admitting: Cardiovascular Disease

## 2022-03-17 DIAGNOSIS — K5730 Diverticulosis of large intestine without perforation or abscess without bleeding: Secondary | ICD-10-CM | POA: Diagnosis not present

## 2022-03-17 DIAGNOSIS — I7102 Dissection of abdominal aorta: Secondary | ICD-10-CM | POA: Diagnosis not present

## 2022-03-17 DIAGNOSIS — I7772 Dissection of iliac artery: Secondary | ICD-10-CM | POA: Diagnosis not present

## 2022-03-17 DIAGNOSIS — J984 Other disorders of lung: Secondary | ICD-10-CM | POA: Diagnosis not present

## 2022-03-17 DIAGNOSIS — I71019 Dissection of thoracic aorta, unspecified: Secondary | ICD-10-CM | POA: Diagnosis not present

## 2022-03-17 MED ORDER — IOHEXOL 350 MG/ML SOLN
125.0000 mL | Freq: Once | INTRAVENOUS | Status: AC | PRN
  Administered 2022-03-17: 120 mL via INTRAVENOUS

## 2022-03-20 ENCOUNTER — Encounter: Payer: Self-pay | Admitting: Cardiovascular Disease

## 2022-03-22 DIAGNOSIS — R0683 Snoring: Secondary | ICD-10-CM | POA: Diagnosis not present

## 2022-03-22 DIAGNOSIS — G4733 Obstructive sleep apnea (adult) (pediatric): Secondary | ICD-10-CM | POA: Diagnosis not present

## 2022-04-06 ENCOUNTER — Ambulatory Visit: Payer: Medicare Other | Attending: Cardiovascular Disease | Admitting: *Deleted

## 2022-04-06 DIAGNOSIS — I71019 Dissection of thoracic aorta, unspecified: Secondary | ICD-10-CM | POA: Diagnosis not present

## 2022-04-06 DIAGNOSIS — Z5181 Encounter for therapeutic drug level monitoring: Secondary | ICD-10-CM

## 2022-04-06 DIAGNOSIS — Z9889 Other specified postprocedural states: Secondary | ICD-10-CM

## 2022-04-06 LAB — POCT INR: INR: 3 (ref 2.0–3.0)

## 2022-04-06 NOTE — Patient Instructions (Signed)
Description   Start taking warfarin 1 tablet daily except 1/2 tablet each Sundays, Wednesdays, and Fridays. Repeat INR in 4 weeks per request. Anticoagulation Clinic [phone]

## 2022-04-12 ENCOUNTER — Encounter: Payer: Self-pay | Admitting: Pulmonary Disease

## 2022-04-12 NOTE — Progress Notes (Signed)
$'@Patient'i$  ID: Patrick Bell, male    DOB: 10-Jun-1940, 82 y.o.   MRN: [id number]  Chief Complaint  Patient presents with   Follow-up    Pt is here for follo wup for asthma-copd. Pt was given sample of stiolto last visit with [facility] but pt states he did not see much relief with it. Pt wants to know if he needs to use it everyday it get more relief. ACT is 20. Pt is also wearing cpap. Download is printed and on your desk. Pt states no issues with cpap.     Referring provider: Dettinger, Fransisca Kaufmann, MD  HPI:   82 y.o. man whom we are seeing in follow up for evaluation of dyspnea on exertion.  Most recent pulmonary note x 2 reviewed.  Returns for routine follow-up.  Seen by Roxan Diesel x 2 in the interim.  Was started on Symbicort given some concern for asthma which I share.  Unfortunate had some side effects.  Then placed on Stiolto.  Today does not think Stiolto helped very much.  Dyspnea largely unchanged.  Reviewed his PFTs in detail, these are essentially normal.  Overall his symptom burden seems relatively light.  Reviewed findings of sleep apnea on recent sleep test.  He and I review CPAP compliance report which reveals excellent adherence with minimal AHI.  HPI initial visit: Chief complaint is really his lightheadedness, dizziness.  Sometimes room spinning or vertigo.  Most times just lightheaded, not that he is going to pass out.  But does feel lightheaded.  Rarely feels sensation of presyncope.  Occurs at rest and with exertion but most often with exertion.  He endorses worsening dyspnea on exertion over the last month.  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  Overall he is much less active than prior.  After his prolonged hospitalization he completed due to mitral valve repair and aortic dissection repair he got back to walking.  Over an hour it sounds like.  On the trails.  Now hardly walking at all.  No environmental factors he can identify that make things better or worse.  No  times a day with things are better or worse.  No position make things better or worse.  No real alleviating or exacerbating factors.  Started a month ago.  Out of the blue.  No clear trigger.  Endorses a history of asthma well-controlled.  When asked he does have albuterol inhaler.  It helps mildly with symptoms described above.  Not on maintenance inhaler.  PMH: Hypertension, mitral valve repair, sleep apnea, asthma Surgical history: mitral valve repair, aortic dissection repair Family history: Reviewed, no pertinent family history Social history: Former smoker, 100+ pack year.  Quit many years ago.  Lives in [place] / Pulmonary Flowsheets:   ACT:  Asthma Control Test ACT Total Score  03/08/2022  9:58 AM 20    MMRC:     No data to display           Epworth:      No data to display           Tests:   FENO:  No results found for: "NITRICOXIDE"  PFT:    Latest Ref Rng & Units 09/07/2021   11:54 AM  PFT Results  FVC-Pre L 3.50   FVC-Predicted Pre % 88   FVC-Post L 3.80   FVC-Predicted Post % 96   Pre FEV1/FVC % % 63   Post FEV1/FCV % % 62  FEV1-Pre L 2.21   FEV1-Predicted Pre % 79   FEV1-Post L 2.36   DLCO uncorrected ml/min/mmHg 21.70   DLCO UNC% % 89   DLCO corrected ml/min/mmHg 21.70   DLCO COR %Predicted % 89   DLVA Predicted % 84   TLC L 6.83   TLC % Predicted % 96   RV % Predicted % 116     WALK:      No data to display           Imaging: Personally reviewed and as per EMR discussion this note CTA CHEST/ABD/PEL DISSECTION W&/OR WO & GATING  Result Date: 03/18/2022 CLINICAL DATA:  82 year old with aortic dissection and status post repair of ascending thoracic aortic dissection. EXAM: CT ANGIOGRAPHY CHEST, ABDOMEN AND PELVIS TECHNIQUE: Non-contrast CT of the chest was initially obtained. Multidetector CT imaging through the chest, abdomen and pelvis was performed using the standard protocol during bolus administration of  intravenous contrast. Multiplanar reconstructed images and MIPs were obtained and reviewed to evaluate the vascular anatomy. RADIATION DOSE REDUCTION: This exam was performed according to the departmental dose-optimization program which includes automated exposure control, adjustment of the mA and/or kV according to patient size and/or use of iterative reconstruction technique. CONTRAST:  135m OMNIPAQUE IOHEXOL 350 MG/ML SOLN COMPARISON:  03/23/2021 FINDINGS: CTA CHEST FINDINGS Cardiovascular: Repair of the ascending thoracic aortic dissection with a graft. The graft is widely patent. Again noted is a dissection involving the aortic arch and extending into the descending thoracic aorta. Aortic arch on sequence 6, image 50 measures approximately 4.5 cm and previously measured 4.4 cm. Great vessels remain patent. Again noted is a dissection extending into the left subclavian artery. Left vertebral artery is patent. Left subclavian artery dissection does not extend into the left axillary artery. Proximal common carotid arteries are patent bilaterally. Evidence for small right vertebral artery. Proximal descending thoracic aorta measures 4.3 cm and stable. Mid descending thoracic aorta at the level of the main pulmonary artery measures 3.4 cm and stable. Distal descending thoracic aorta measures 2.6 cm and stable. Aortic dissection extends into the abdominal aorta. Stable appearance of the heart with previous mitral valve repair. Mediastinum/Nodes: No lymph node enlargement in the mediastinum or hila. Thyroid tissue is unremarkable. No axillary lymph node enlargement. Esophagus is unremarkable. Lungs/Pleura: Trachea and mainstem bronchi are patent. Stable elongated nodular density in the right middle lobe on sequence 8, image 116 measures up to 7 mm. Stable tiny nodular density in the right upper lobe on sequence 8, image 25. No airspace disease or consolidation in the lungs. Stable branching densities in the periphery  of the left upper lobe on sequence 8, image 64. No large pleural effusions. Stable subtle nodular density in the left upper lobe on sequence 8, image 28. Musculoskeletal: No acute bone abnormality. Review of the MIP images confirms the above findings. CTA ABDOMEN AND PELVIS FINDINGS VASCULAR Aorta: Aortic dissection involving the entire abdominal aorta that extends into the iliac arteries bilaterally. Configuration of the abdominal aorta and dissection has not significantly changed. Supra celiac abdominal aorta measures 2.9 cm and stable. True lumen is along the left side of the proximal abdominal aorta. Celiac: Celiac trunk originates from the true lumen. Celiac trunk is patent without aneurysm or dissection. No significant stenosis. SMA: Dissection involving the origin the SMA but the flow appears to be predominantly from the true lumen. No significant stenosis or aneurysm involving the SMA. Renals: Dissection involves the proximal right renal artery. Dissection most likely involves  the proximal left renal artery. This configuration is unchanged. Bilateral renal arteries are patent without significant stenosis or aneurysm. IMA: Originates from the false lumen and patent without aneurysm or significant stenosis. Inflow: Dissection extends into the common iliac arteries bilaterally. Dissection extends into the right external iliac artery and terminates in the mid/distal aspect and stable from the previous examination. Left iliac dissection extends into the distal left external iliac artery and terminates in the proximal left common femoral artery region. Configuration and extent of the left iliac artery dissection has not significantly changed. Bilateral internal iliac arteries are patent and originating from the true lumens. Proximal femoral arteries are patent bilaterally. Veins: No obvious venous abnormality within the limitations of this arterial phase study. Review of the MIP images confirms the above findings.  NON-VASCULAR Hepatobiliary: Subtle hypodensity in the posterior right hepatic lobe on sequence 11, image 72 is nonspecific and likely an incidental finding. No biliary dilatation. Portal venous system is patent. Normal appearance of the gallbladder. Pancreas: Unremarkable. No pancreatic ductal dilatation or surrounding inflammatory changes. Spleen: Normal in size without focal abnormality. Adrenals/Urinary Tract: Normal adrenal glands. Stable appearance of both kidneys without hydronephrosis. Probable left renal sinus cyst which is similar to the previous examination. Evidence for small cortical renal cysts. These cysts do not require dedicated follow-up. No suspicious renal lesions. Normal appearance of the urinary bladder. Stomach/Bowel: Normal appearance of the stomach and small bowel. Colonic diverticulosis particularly in the sigmoid colon. Chronic densities along the origin of the left inguinal canal related to previous hernia repair. No evidence for acute colonic or bowel inflammation. Normal appendix without inflammatory changes. There is small bowel near the origin of the right inguinal canal. Lymphatic: No lymph node enlargement in the abdomen or pelvis. Reproductive: Prostate is upper limits of normal and stable the previous examination. Other: Negative for free fluid. Negative for free air. Evidence for bilateral inguinal hernia repairs. Musculoskeletal: Multilevel degenerative disc and facet disease in lumbar spine. No acute bone abnormality. Review of the MIP images confirms the above findings. IMPRESSION: 1. Stable appearance of the residual dissection involving the aortic arch, descending thoracic aorta, abdominal aorta and bilateral iliac arteries. No significant change in the size of the thoracic or abdominal aorta. Stable appearance of the surgically repaired ascending thoracic aorta. 2. No acute abnormality involving the chest, abdomen or pelvis. 3. Colonic diverticulosis without acute  inflammation. Electronically Signed   By: Markus Daft M.D.   On: 03/18/2022 10:52    Lab Results: Personally reviewed CBC    Component Value Date/Time   WBC 4.0 01/05/2022 1056   WBC 8.3 12/02/2020 1851   RBC 4.89 01/05/2022 1056   RBC 5.34 12/02/2020 1851   HGB 13.7 01/05/2022 1056   HCT 41.2 01/05/2022 1056   PLT 214 01/05/2022 1056   MCV 84 01/05/2022 1056   MCH 28.0 01/05/2022 1056   MCH 27.7 12/02/2020 1851   MCHC 33.3 01/05/2022 1056   MCHC 31.7 12/02/2020 1851   RDW 13.2 01/05/2022 1056   LYMPHSABS 0.8 01/05/2022 1056   MONOABS 0.6 12/02/2020 1851   EOSABS 0.0 01/05/2022 1056   BASOSABS 0.1 01/05/2022 1056    BMET    Component Value Date/Time   NA 143 01/05/2022 1056   K 4.3 01/05/2022 1056   CL 104 01/05/2022 1056   CO2 23 01/05/2022 1056   GLUCOSE 85 01/05/2022 1056   GLUCOSE 105 (H) 12/02/2020 1851   BUN 21 01/05/2022 1056   CREATININE 1.05 01/05/2022 1056  CREATININE 0.91 06/18/2012 1031   CALCIUM 9.6 01/05/2022 1056   GFRNONAA >60 12/02/2020 1851   GFRNONAA 84 06/18/2012 1031   GFRAA 45 (L) 04/27/2020 1705   GFRAA >89 06/18/2012 1031    BNP    Component Value Date/Time   BNP 112.7 (H) 09/10/2020 1154    ProBNP No results found for: "PROBNP"  Specialty Problems       Pulmonary Problems   Asthma-COPD overlap syndrome    Qualifier: Diagnosis of  By: Burnett Kanaris        OSA (obstructive sleep apnea)    Qualifier: Diagnosis of  By: Burnett Kanaris        Allergic rhinitis    No Known Allergies  Immunization History  Administered Date(s) Administered   Fluad Quad(high Dose 65+) 02/13/2019, 01/06/2020, 02/05/2021, 01/05/2022   Influenza, High Dose Seasonal PF 01/23/2018   Influenza,inj,Quad PF,6+ Mos 01/14/2013, 03/14/2014, 03/11/2015, 01/19/2016, 01/18/2017   Moderna Sars-Covid-2 Vaccination 05/10/2019, 06/07/2019, 03/05/2020   Pneumococcal Conjugate-13 03/14/2014   Pneumococcal Polysaccharide-23 01/14/2013   Tdap  07/06/2020   Zoster Recombinat (Shingrix) 07/08/2021, 01/05/2022   Zoster, Live 06/14/2011    Past Medical History:  Diagnosis Date   Acute thoracic aortic dissection (HCC) 03/11/2020   Type A   Allergy    Asthma    Cataract    Cataract    Diverticulitis    Dyspnea    GERD (gastroesophageal reflux disease)    Heart murmur 12/2019   Hyperlipidemia    Hypertension    Inguinal hernia bilateral, non-recurrent    S/P aortic dissection repair 03/11/2020   supracoronary straight graft repair with resuspension of native aortic valve and open distal anastomosis for intraoperative acute type A aortic dissection    S/P mitral valve repair 03/11/2020   Complex valvuloplasty including artificial Gore-tex neochord placement x 4, suture plication of posterior leaflet and posteromedial commissure and 32 mm Sorin Memo 4D ring annuloplasty   Sleep apnea     Tobacco History: Social History   Tobacco Use  Smoking Status Former   Packs/day: 4.00   Years: 25.00   Total pack years: 100.00   Types: Cigarettes   Quit date: 06/23/1982   Years since quitting: 39.8  Smokeless Tobacco Never  Tobacco Comments   pt was a truck driver   Counseling given: Not Answered Tobacco comments: pt was a truck driver   Continue to not smoke  Outpatient Encounter Medications as of 03/08/2022  Medication Sig   acetaminophen (TYLENOL) 325 MG tablet Take 1-2 tablets (325-650 mg total) by mouth every 4 (four) hours as needed for mild pain.   albuterol (VENTOLIN HFA) 108 (90 Base) MCG/ACT inhaler INHALE 2 PUFFS EVERY 6 HOURS AS NEEDED FOR WHEEZING OR SHORTNESS OF BREATH   guaiFENesin (MUCINEX) 600 MG 12 hr tablet Take 600 mg by mouth every 4 (four) hours as needed for cough or to loosen phlegm.   ipratropium (ATROVENT) 0.03 % nasal spray    levothyroxine (SYNTHROID) 50 MCG tablet Take 1 tablet (50 mcg total) by mouth daily.   metoprolol succinate (TOPROL-XL) 50 MG 24 hr tablet Take 1 tablet by mouth at night    montelukast (SINGULAIR) 10 MG tablet TAKE 1 TABLET DAILY   Olopatadine HCl 0.2 % SOLN Place 1 drop into both eyes daily as needed (allergies).   pantoprazole (PROTONIX) 40 MG tablet TAKE 1 TABLET DAILY   warfarin (COUMADIN) 5 MG tablet TAKE ONE TABLET ONCE DAILY AT 4PM   [DISCONTINUED] amLODipine (NORVASC) 5 MG tablet  Take 1 tablet (5 mg total) by mouth 2 (two) times daily.   [DISCONTINUED] simvastatin (ZOCOR) 10 MG tablet TAKE ONE TABLET AT BEDTIME   [DISCONTINUED] Tiotropium Bromide-Olodaterol (STIOLTO RESPIMAT) 2.5-2.5 MCG/ACT AERS Inhale 2 puffs into the lungs daily.   [DISCONTINUED] Elastic Bandages & Supports (ABDOMINAL BINDER/ELASTIC LARGE) MISC 1 each by Does not apply route daily.   No facility-administered encounter medications on file as of 03/08/2022.     Review of Systems  Review of Systems  N/a Physical Exam  BP 126/70 (BP Location: Left Arm, Patient Position: Sitting, Cuff Size: Normal)   Pulse 71   Ht '5\' 10"'$  (1.778 m)   Wt 183 lb (83 kg)   SpO2 97%   BMI 26.26 kg/m   Wt Readings from Last 5 Encounters:  03/09/22 183 lb 6.4 oz (83.2 kg)  03/08/22 183 lb (83 kg)  01/25/22 183 lb 12.8 oz (83.4 kg)  01/05/22 183 lb (83 kg)  12/21/21 183 lb 9.6 oz (83.3 kg)    BMI Readings from Last 5 Encounters:  03/09/22 26.32 kg/m  03/08/22 26.26 kg/m  01/25/22 26.37 kg/m  01/05/22 26.26 kg/m  12/21/21 26.34 kg/m     Physical Exam General: Well-appearing, no acute distress Eyes: EOMI, icterus Neck: Supple, JVP Pulmonary: Clear, no work of breathing, good excursion Cardiovascular: Regular rhythm, warm Abdomen: Nondistended, bowel sounds present MSK: No synovitis, joint effusion Neuro: Normal gait, no weakness Psych: Normal mood, full affect   Assessment & Plan:   Dyspnea on exertion: Really on the problem of the last month or so he thinks.  Much less active overall compared to postoperatively when he was walking up to an hour a day.  Suspect component of  deconditioning which she does as well.  Given worsening for the last month, do worry a bit about possible asthma which she endorses a history of.  Not on maintenance inhaler currently.  In addition, he has cardiac reasons for dyspnea on exertion and his left atrial dilation, mitral valve regurgitation, diastolic dysfunction, LVH.  Chest x-ray clear.  PFTs normal.  Inhalers are not really help.  No need to continue.  OSA: He endorses adherence to CPAP.  Reviewed compliance report.  Excellent compliance with minimal AHI.  He is benefiting from CPAP and should continue.   Follow-up in 1 year or sooner if needed with Dr. Doylene Canard, MD 04/12/2022

## 2022-04-22 DIAGNOSIS — G4733 Obstructive sleep apnea (adult) (pediatric): Secondary | ICD-10-CM | POA: Diagnosis not present

## 2022-04-22 DIAGNOSIS — R0683 Snoring: Secondary | ICD-10-CM | POA: Diagnosis not present

## 2022-05-02 ENCOUNTER — Ambulatory Visit: Payer: Medicare Other | Attending: Cardiology

## 2022-05-02 DIAGNOSIS — Z9889 Other specified postprocedural states: Secondary | ICD-10-CM

## 2022-05-02 DIAGNOSIS — Z5181 Encounter for therapeutic drug level monitoring: Secondary | ICD-10-CM | POA: Diagnosis not present

## 2022-05-02 DIAGNOSIS — M79672 Pain in left foot: Secondary | ICD-10-CM | POA: Diagnosis not present

## 2022-05-02 DIAGNOSIS — I71019 Dissection of thoracic aorta, unspecified: Secondary | ICD-10-CM | POA: Diagnosis not present

## 2022-05-02 DIAGNOSIS — L84 Corns and callosities: Secondary | ICD-10-CM | POA: Diagnosis not present

## 2022-05-02 LAB — POCT INR: INR: 2.7 (ref 2.0–3.0)

## 2022-05-02 NOTE — Patient Instructions (Addendum)
Continue taking warfarin 1 tablet daily except 1/2 tablet each Sundays, Wednesdays, and Fridays. Repeat INR in 3 weeks per request. Anticoagulation Clinic [phone]

## 2022-05-04 ENCOUNTER — Ambulatory Visit: Payer: Medicare Other

## 2022-05-09 ENCOUNTER — Other Ambulatory Visit (HOSPITAL_BASED_OUTPATIENT_CLINIC_OR_DEPARTMENT_OTHER): Payer: Self-pay | Admitting: Family Medicine

## 2022-05-11 ENCOUNTER — Ambulatory Visit: Payer: Medicare Other | Admitting: Surgery

## 2022-05-11 ENCOUNTER — Encounter: Payer: Self-pay | Admitting: Surgery

## 2022-05-11 VITALS — BP 161/75 | HR 71 | Resp 18 | Ht 70.0 in | Wt 189.0 lb

## 2022-05-11 DIAGNOSIS — I7101 Dissection of ascending aorta: Secondary | ICD-10-CM | POA: Diagnosis not present

## 2022-05-11 NOTE — Progress Notes (Signed)
HPI:  The patient is an 82 year old gentleman who underwent minimally invasive mitral valve repair on 03/11/2020 by Dr. Roxy Manns which was complicated by an acute aortic dissection after initiation of cardiopulmonary bypass requiring conversion to sternotomy and replacement of the ascending aorta with resuspension of the native aortic valve followed by mitral valve repair.  He developed a significant pericardial effusion postoperatively and had to undergo pericardial window.  Since I last saw him on 05/19/2021 he has been feeling well overall without chest or back pain.  He denies any shortness of breath.  He is here today with his wife.  Current Outpatient Medications  Medication Sig Dispense Refill   acetaminophen (TYLENOL) 325 MG tablet Take 1-2 tablets (325-650 mg total) by mouth every 4 (four) hours as needed for mild pain.     albuterol (VENTOLIN HFA) 108 (90 Base) MCG/ACT inhaler INHALE 2 PUFFS EVERY 6 HOURS AS NEEDED FOR WHEEZING OR SHORTNESS OF BREATH 8.5 g 2   amLODipine (NORVASC) 10 MG tablet Take 1 tablet (10 mg total) by mouth daily. 90 tablet 3   guaiFENesin (MUCINEX) 600 MG 12 hr tablet Take 600 mg by mouth every 4 (four) hours as needed for cough or to loosen phlegm.     ipratropium (ATROVENT) 0.03 % nasal spray      levothyroxine (SYNTHROID) 50 MCG tablet Take 1 tablet (50 mcg total) by mouth daily. 90 tablet 3   metoprolol succinate (TOPROL-XL) 50 MG 24 hr tablet Take 1 tablet by mouth at night 90 tablet 3   montelukast (SINGULAIR) 10 MG tablet TAKE 1 TABLET DAILY 90 tablet 0   Olopatadine HCl 0.2 % SOLN Place 1 drop into both eyes daily as needed (allergies).     pantoprazole (PROTONIX) 40 MG tablet TAKE 1 TABLET DAILY 90 tablet 3   rosuvastatin (CRESTOR) 20 MG tablet Take 1 tablet (20 mg total) by mouth daily. 90 tablet 3   warfarin (COUMADIN) 5 MG tablet TAKE ONE TABLET ONCE DAILY AT 4PM 90 tablet 3   No current facility-administered medications for this visit.      Physical Exam: BP (!) 161/75 (BP Location: Right Arm, Patient Position: Sitting)   Pulse 71   Resp 18   Ht '5\' 10"'$  (1.778 m)   Wt 189 lb (85.7 kg)   SpO2 96% Comment: RA  BMI 27.12 kg/m  He looks well. Cardiac exam shows a regular rate and rhythm with normal heart sounds.  There is no murmur. Lungs are clear. There is no peripheral edema.  Diagnostic Tests:  Narrative & Impression  CLINICAL DATA:  82 year old with aortic dissection and status post repair of ascending thoracic aortic dissection.   EXAM: CT ANGIOGRAPHY CHEST, ABDOMEN AND PELVIS   TECHNIQUE: Non-contrast CT of the chest was initially obtained.   Multidetector CT imaging through the chest, abdomen and pelvis was performed using the standard protocol during bolus administration of intravenous contrast. Multiplanar reconstructed images and MIPs were obtained and reviewed to evaluate the vascular anatomy.   RADIATION DOSE REDUCTION: This exam was performed according to the departmental dose-optimization program which includes automated exposure control, adjustment of the mA and/or kV according to patient size and/or use of iterative reconstruction technique.   CONTRAST:  146m OMNIPAQUE IOHEXOL 350 MG/ML SOLN   COMPARISON:  03/23/2021   FINDINGS: CTA CHEST FINDINGS   Cardiovascular: Repair of the ascending thoracic aortic dissection with a graft. The graft is widely patent. Again noted is a dissection involving the aortic arch and  extending into the descending thoracic aorta. Aortic arch on sequence 6, image 50 measures approximately 4.5 cm and previously measured 4.4 cm. Great vessels remain patent. Again noted is a dissection extending into the left subclavian artery. Left vertebral artery is patent. Left subclavian artery dissection does not extend into the left axillary artery. Proximal common carotid arteries are patent bilaterally. Evidence for small right vertebral artery. Proximal  descending thoracic aorta measures 4.3 cm and stable. Mid descending thoracic aorta at the level of the main pulmonary artery measures 3.4 cm and stable. Distal descending thoracic aorta measures 2.6 cm and stable. Aortic dissection extends into the abdominal aorta. Stable appearance of the heart with previous mitral valve repair.   Mediastinum/Nodes: No lymph node enlargement in the mediastinum or hila. Thyroid tissue is unremarkable. No axillary lymph node enlargement. Esophagus is unremarkable.   Lungs/Pleura: Trachea and mainstem bronchi are patent. Stable elongated nodular density in the right middle lobe on sequence 8, image 116 measures up to 7 mm. Stable tiny nodular density in the right upper lobe on sequence 8, image 25. No airspace disease or consolidation in the lungs. Stable branching densities in the periphery of the left upper lobe on sequence 8, image 64. No large pleural effusions. Stable subtle nodular density in the left upper lobe on sequence 8, image 28.   Musculoskeletal: No acute bone abnormality.   Review of the MIP images confirms the above findings.   CTA ABDOMEN AND PELVIS FINDINGS   VASCULAR   Aorta: Aortic dissection involving the entire abdominal aorta that extends into the iliac arteries bilaterally. Configuration of the abdominal aorta and dissection has not significantly changed. Supra celiac abdominal aorta measures 2.9 cm and stable. True lumen is along the left side of the proximal abdominal aorta.   Celiac: Celiac trunk originates from the true lumen. Celiac trunk is patent without aneurysm or dissection. No significant stenosis.   SMA: Dissection involving the origin the SMA but the flow appears to be predominantly from the true lumen. No significant stenosis or aneurysm involving the SMA.   Renals: Dissection involves the proximal right renal artery. Dissection most likely involves the proximal left renal artery. This configuration is  unchanged. Bilateral renal arteries are patent without significant stenosis or aneurysm.   IMA: Originates from the false lumen and patent without aneurysm or significant stenosis.   Inflow: Dissection extends into the common iliac arteries bilaterally. Dissection extends into the right external iliac artery and terminates in the mid/distal aspect and stable from the previous examination. Left iliac dissection extends into the distal left external iliac artery and terminates in the proximal left common femoral artery region. Configuration and extent of the left iliac artery dissection has not significantly changed. Bilateral internal iliac arteries are patent and originating from the true lumens. Proximal femoral arteries are patent bilaterally.   Veins: No obvious venous abnormality within the limitations of this arterial phase study.   Review of the MIP images confirms the above findings.   NON-VASCULAR   Hepatobiliary: Subtle hypodensity in the posterior right hepatic lobe on sequence 11, image 72 is nonspecific and likely an incidental finding. No biliary dilatation. Portal venous system is patent. Normal appearance of the gallbladder.   Pancreas: Unremarkable. No pancreatic ductal dilatation or surrounding inflammatory changes.   Spleen: Normal in size without focal abnormality.   Adrenals/Urinary Tract: Normal adrenal glands. Stable appearance of both kidneys without hydronephrosis. Probable left renal sinus cyst which is similar to the previous examination. Evidence for  small cortical renal cysts. These cysts do not require dedicated follow-up. No suspicious renal lesions. Normal appearance of the urinary bladder.   Stomach/Bowel: Normal appearance of the stomach and small bowel. Colonic diverticulosis particularly in the sigmoid colon. Chronic densities along the origin of the left inguinal canal related to previous hernia repair. No evidence for acute colonic or  bowel inflammation. Normal appendix without inflammatory changes. There is small bowel near the origin of the right inguinal canal.   Lymphatic: No lymph node enlargement in the abdomen or pelvis.   Reproductive: Prostate is upper limits of normal and stable the previous examination.   Other: Negative for free fluid. Negative for free air. Evidence for bilateral inguinal hernia repairs.   Musculoskeletal: Multilevel degenerative disc and facet disease in lumbar spine. No acute bone abnormality.   Review of the MIP images confirms the above findings.   IMPRESSION: 1. Stable appearance of the residual dissection involving the aortic arch, descending thoracic aorta, abdominal aorta and bilateral iliac arteries. No significant change in the size of the thoracic or abdominal aorta. Stable appearance of the surgically repaired ascending thoracic aorta. 2. No acute abnormality involving the chest, abdomen or pelvis. 3. Colonic diverticulosis without acute inflammation.     Electronically Signed   By: Markus Daft M.D.   On: 03/18/2022 10:52      Impression:  Overall I think he is doing well a little over 2 years following his repair of an intraoperative aortic dissection and mitral valve repair by Dr. Roxy Manns.  His echocardiogram in January 2023 showed mild residual aortic insufficiency with a pressure half-time of 595 ms.  CTA of the chest, abdomen, and pelvis now shows an unremarkable aortic root.  There is mild dilatation of the aortic arch and proximal descending thoracic aorta with a maximum diameter of 4.5 cm in the aortic arch and 4.3 cm in the proximal descending thoracic aorta.  There is a residual chronic aortic dissection beginning just beyond the distal anastomosis at the aortic arch extending down through the abdominal aorta.  There is flow in both the true and false lumens.  This does not appear any different than from his prior CTA of the chest, abdomen, and pelvis done in  December 2022.  I reviewed the CTA images with him and his wife and answered their questions.  I told them I did not think there is any reason for intervention.  I stressed the importance of continued good blood pressure control in preventing further enlargement and recurrent aortic dissection.  Plan:  I will see him back in 1 year with a CTA of the chest, abdomen, and pelvis for aortic surveillance.  If it is unchanged at that time we will discuss when and if to do further follow-up.  I spent 20 minutes performing this established patient evaluation and > 50% of this time was spent face to face counseling and coordinating the care of this patient's chronic aortic dissection and aneurysm.   Gaye Pollack, MD [facility] [phone]

## 2022-05-12 DIAGNOSIS — M5451 Vertebrogenic low back pain: Secondary | ICD-10-CM | POA: Diagnosis not present

## 2022-05-23 ENCOUNTER — Other Ambulatory Visit: Payer: Self-pay

## 2022-05-23 ENCOUNTER — Encounter: Payer: Self-pay | Admitting: Physical Therapy

## 2022-05-23 ENCOUNTER — Ambulatory Visit: Payer: Medicare Other | Attending: Orthopedic Surgery | Admitting: Physical Therapy

## 2022-05-23 DIAGNOSIS — M62830 Muscle spasm of back: Secondary | ICD-10-CM

## 2022-05-23 DIAGNOSIS — M5459 Other low back pain: Secondary | ICD-10-CM | POA: Diagnosis not present

## 2022-05-23 DIAGNOSIS — R293 Abnormal posture: Secondary | ICD-10-CM | POA: Insufficient documentation
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
## 2022-05-25 ENCOUNTER — Ambulatory Visit: Payer: Medicare Other

## 2022-05-25 DIAGNOSIS — M2042 Other hammer toe(s) (acquired), left foot: Secondary | ICD-10-CM | POA: Diagnosis not present

## 2022-05-25 DIAGNOSIS — L84 Corns and callosities: Secondary | ICD-10-CM | POA: Diagnosis not present

## 2022-05-26 ENCOUNTER — Ambulatory Visit: Payer: Medicare Other | Admitting: *Deleted

## 2022-05-26 ENCOUNTER — Encounter: Payer: Self-pay | Admitting: *Deleted

## 2022-05-26 DIAGNOSIS — M62830 Muscle spasm of back: Secondary | ICD-10-CM | POA: Diagnosis not present

## 2022-05-26 DIAGNOSIS — M5459 Other low back pain: Secondary | ICD-10-CM | POA: Diagnosis not present

## 2022-05-26 DIAGNOSIS — R293 Abnormal posture: Secondary | ICD-10-CM | POA: Diagnosis not present

## 2022-05-26 NOTE — Therapy (Signed)
OUTPATIENT PHYSICAL THERAPY THORACOLUMBAR EVALUATION   Patient Name: Patrick Bell MRN: [id number] DOB:1940-10-07, 82 y.o., male Today's Date: 05/26/2022  END OF SESSION:  PT End of Session - 05/26/22 1813     Visit Number 2    Number of Visits 12    Date for PT Re-Evaluation 06/20/22    Authorization Type FOTO AT LEAST EVERY 5TH VISIT.  PROGRESS NOTE AT 10TH VISIT.  KX MODIFIER AFTER 15 VISITS.    PT Start Time 1600    PT Stop Time 1648    PT Time Calculation (min) 48 min              Past Medical History:  Diagnosis Date   Acute thoracic aortic dissection (HCC) 03/11/2020   Type A   Allergy    Asthma    Cataract    Cataract    Diverticulitis    Dyspnea    GERD (gastroesophageal reflux disease)    Heart murmur 12/2019   Hyperlipidemia    Hypertension    Inguinal hernia bilateral, non-recurrent    S/P aortic dissection repair 03/11/2020   supracoronary straight graft repair with resuspension of native aortic valve and open distal anastomosis for intraoperative acute type A aortic dissection    S/P mitral valve repair 03/11/2020   Complex valvuloplasty including artificial Gore-tex neochord placement x 4, suture plication of posterior leaflet and posteromedial commissure and 32 mm Sorin Memo 4D ring annuloplasty   Sleep apnea    Past Surgical History:  Procedure Laterality Date   bilateral inguinal hernia     CARDIAC CATHETERIZATION Bilateral 02/21/2020   CHEST TUBE INSERTION N/A 03/22/2020   Procedure: CHEST TUBE INSERTION OF 28 BLAKE DRAIN.;  Surgeon: Grace Isaac, MD;  Location: [place] [facility];  Service: Thoracic;  Laterality: N/A;   COLONOSCOPY  06/09/2004   [id number] colon diverticulosis, otherwise normal colonoscopy   EYE SURGERY  08/2014   HERNIA REPAIR     MITRAL VALVE REPAIR N/A 03/11/2020   Procedure: MITRAL VALVE REPAIR (MVR) USING MEMO 4D SIZE 32 RING;  Surgeon: Rexene Alberts, MD;  Location: [place];  Service: Open Heart Surgery;  Laterality: N/A;    PALATE / UVULA BIOPSY / EXCISION     PERICARDIAL WINDOW N/A 03/22/2020   Procedure: SUBXYPHOID POST-OP PERICARDIAL FLUID DRAINAGE WITH CHEST TUBE INSERTION.;  Surgeon: Grace Isaac, MD;  Location: [place];  Service: Thoracic;  Laterality: N/A;   REPAIR OF ACUTE ASCENDING THORACIC AORTIC DISSECTION N/A 03/11/2020   Procedure: REPAIR OF ACUTE ASCENDING THORACIC AORTIC DISSECTION USING A HEMASHIELD PLATINUM 26MM STRAIGHT GRAFT AND A HEASHIELD PLATINUM 28X10MM SINGLE ARM GRAFT;  Surgeon: Rexene Alberts, MD;  Location: [place];  Service: Open Heart Surgery;  Laterality: N/A;   RIGHT/LEFT HEART CATH AND CORONARY ANGIOGRAPHY N/A 02/21/2020   Procedure: RIGHT/LEFT HEART CATH AND CORONARY ANGIOGRAPHY;  Surgeon: Troy Sine, MD;  Location: [place] [facility];  Service: Cardiovascular;  Laterality: N/A;   TEE WITHOUT CARDIOVERSION N/A 12/19/2019   Procedure: TRANSESOPHAGEAL ECHOCARDIOGRAM (TEE);  Surgeon: Buford Dresser, MD;  Location: [facility] END[facility];  Service: Cardiovascular;  Laterality: N/A;   TEE WITHOUT CARDIOVERSION N/A 03/11/2020   Procedure: TRANSESOPHAGEAL ECHOCARDIOGRAM (TEE);  Surgeon: Rexene Alberts, MD;  Location: [place];  Service: Open Heart Surgery;  Laterality: N/A;   TEE WITHOUT CARDIOVERSION N/A 03/22/2020   Procedure: TRANSESOPHAGEAL ECHOCARDIOGRAM (TEE);  Surgeon: Grace Isaac, MD;  Location: [place];  Service: Thoracic;  Laterality: N/A;   TONSILLECTOMY  VASECTOMY     Patient Active Problem List   Diagnosis Date Noted   Allergic rhinitis 11/08/2021   Presbycusis of both ears 10/21/2021   Long term (current) use of anticoagulants 04/27/2020   Atrial fibrillation ([place]) 04/27/2020   Anemia 04/21/2020   Peripheral edema    Hypothyroidism    S/P mitral valve repair 03/11/2020   S/P aortic dissection repair 03/11/2020   BPH (benign prostatic hyperplasia) 07/29/2014   Low serum vitamin D 03/14/2014   Hyperlipidemia    GERD (gastroesophageal reflux disease)  08/03/2012   Essential hypertension, benign 08/19/2008   DIVERTICULAR DISEASE 08/18/2008   BACK PAIN, CHRONIC 08/18/2008   OSA (obstructive sleep apnea) 08/18/2008   PREMATURE VENTRICULAR CONTRACTIONS 07/23/2008   Asthma-COPD overlap syndrome 07/23/2008     REFERRING PROVIDER: Melina Schools MD  REFERRING DIAG: Vertebrogenic low back pain  Rationale for Evaluation and Treatment: Rehabilitation  THERAPY DIAG:  Other low back pain  Muscle spasm of back  Abnormal posture  ONSET DATE: Sine he was in his early 20's.  SUBJECTIVE:                                                                                                                                                                                           SUBJECTIVE STATEMENT: Did okay after last Rx   PERTINENT HISTORY:  HTN, Mitral valve repair.  PAIN:  Are you having pain? 2/10.  Lower lumbar.  "Spasm."  PRECAUTIONS: None  WEIGHT BEARING RESTRICTIONS: No  FALLS:  Has patient fallen in last 6 months? No  LIVING ENVIRONMENT: Lives with: lives with their spouse Lives in: House/apartment Has following equipment at home: None  OCCUPATION: Retired Administrator.  PLOF: Independent with basic ADLs  PATIENT GOALS: Regain strength/stamina while walking.    OBJECTIVE:   PATIENT SURVEYS:  FOTO 22.  MUSCLE LENGTH: Hamstrings: Tightness in hamstrings bilaterally.  POSTURE: rounded shoulders, forward head, decreased lumbar lordosis, and posterior pelvic tilt.  Scoliosis.   PALPATION: Diffuse tenderness over bilateral lower lumbar musculature and a very tender trigger point in his right low back musculature.    LUMBAR ROM:  Active lumbar flexion limited by 2/3's of full range and extension to ~5 degrees.   LOWER EXTREMITY MMT:    Normal bilateral LE strength.  LUMBAR SPECIAL TESTS:  Equal leg lengths.  Bilateral SLR testing remarkable for hamstring stretching. GAIT: Flexed trunk posture.  TODAY'S  TREATMENT:    05-26-22        Discussed walking program and bike  AB bracing Standing Modified bird dog. ARM raise x 6 hold 5 sec each, leg raise x 6 each hold  5 secs Seated SOC for spine mobility Seated Drawin for TVA activation with cues to breathe Handouts given for HEP                                                                                                                               PATIENT EDUCATION:  Education details: We  discussed correct posture which includes while sleeping (he sleeps on his side) with pillows between knees for correct hip alignment. Person educated: Patient Education method: Explanation Education comprehension: verbalized understanding  HOME EXERCISE PROGRAM: HOME EXERCISE PROGRAM Created by Mali Applegate Feb 19th, 2024 View at [email or web address] using code: [place]  Page 1 of 1 1 Exercise Standing lumbar extension Place elbows on wall. Allow hips to fall towards wall. Repeat 10 Times Hold 2 Seconds Complete 1 Set Perform 3 Times a Day  ASSESSMENT:  CLINICAL IMPRESSION: Pt arrived today doing fairly well with pain and wanted to discuss HEP and what he needs to do at home for cardio and back exs. We discussed a walking program as well as standing and sitting core exs and did well. HEP handout given for HEP. Pt did well with RX today. OBJECTIVE IMPAIRMENTS: decreased activity tolerance, decreased mobility, decreased ROM, increased muscle spasms, postural dysfunction, and pain.   ACTIVITY LIMITATIONS: carrying, lifting, bending, and transfers  PARTICIPATION LIMITATIONS: meal prep, cleaning, laundry, and yard work  PERSONAL FACTORS: Time since onset of injury/illness/exacerbation are also affecting patient's functional outcome.   REHAB POTENTIAL: Good  CLINICAL DECISION MAKING: Stable/uncomplicated  EVALUATION COMPLEXITY: Low   GOALS:  LONG TERM GOALS: Target date: 06/20/22.  Ind with a HEP.  Goal status: INITIAL  2.   Sit to stand with pain not > 3/10 and no radiation of pain into bilateral groin regions.  Goal status: INITIAL  3.  Perform ADL's with pain not > 3/10.  Goal status: INITIAL   PLAN:  PT FREQUENCY: 3x/week  PT DURATION: 4 weeks  PLANNED INTERVENTIONS: Therapeutic exercises, Therapeutic activity, Patient/Family education, Self Care, Dry Needling, Electrical stimulation, Cryotherapy, Moist heat, Traction, Ultrasound, and Manual therapy.  PLAN FOR NEXT SESSION: Modalities and STW/M.  Core exercise progression.  Spinal protection/ body mechanics techniques.   Charna Neeb,CHRIS, PTA 05/26/2022, 6:21 PM

## 2022-06-02 ENCOUNTER — Ambulatory Visit: Payer: Medicare Other | Admitting: *Deleted

## 2022-06-02 DIAGNOSIS — R293 Abnormal posture: Secondary | ICD-10-CM | POA: Diagnosis not present

## 2022-06-02 DIAGNOSIS — M62830 Muscle spasm of back: Secondary | ICD-10-CM | POA: Diagnosis not present

## 2022-06-02 DIAGNOSIS — M5459 Other low back pain: Secondary | ICD-10-CM | POA: Diagnosis not present

## 2022-06-02 NOTE — Therapy (Signed)
OUTPATIENT PHYSICAL THERAPY THORACOLUMBAR EVALUATION   Patient Name: Patrick Bell MRN: [id number] DOB:[birth year], 82 y.o., male Today's Date: 06/02/2022  END OF SESSION:  PT End of Session - 06/02/22 1353     Visit Number 3    Number of Visits 12    Date for PT Re-Evaluation 06/20/22    Authorization Type FOTO AT LEAST EVERY 5TH VISIT.  PROGRESS NOTE AT 10TH VISIT.  KX MODIFIER AFTER 15 VISITS.    PT Start Time [id number]    PT Stop Time 1435    PT Time Calculation (min) 50 min              Past Medical History:  Diagnosis Date   Acute thoracic aortic dissection ([place]) 03/11/2020   Type A   Allergy    Asthma    Cataract    Cataract    Diverticulitis    Dyspnea    GERD (gastroesophageal reflux disease)    Heart murmur 12/2019   Hyperlipidemia    Hypertension    Inguinal hernia bilateral, non-recurrent    S/P aortic dissection repair 03/11/2020   supracoronary straight graft repair with resuspension of native aortic valve and open distal anastomosis for intraoperative acute type A aortic dissection    S/P mitral valve repair 03/11/2020   Complex valvuloplasty including artificial Gore-tex neochord placement x 4, suture plication of posterior leaflet and posteromedial commissure and 32 mm Sorin Memo 4D ring annuloplasty   Sleep apnea    Past Surgical History:  Procedure Laterality Date   bilateral inguinal hernia     CARDIAC CATHETERIZATION Bilateral 02/21/2020   CHEST TUBE INSERTION N/A 03/22/2020   Procedure: CHEST TUBE INSERTION OF 28 BLAKE DRAIN.;  Surgeon: Grace Isaac, MD;  Location: [place] [facility];  Service: Thoracic;  Laterality: N/A;   COLONOSCOPY  06/09/2004   [id number] colon diverticulosis, otherwise normal colonoscopy   EYE SURGERY  08/2014   HERNIA REPAIR     MITRAL VALVE REPAIR N/A 03/11/2020   Procedure: MITRAL VALVE REPAIR (MVR) USING MEMO 4D SIZE 32 RING;  Surgeon: Rexene Alberts, MD;  Location: [place];  Service: Open Heart Surgery;  Laterality: N/A;    PALATE / UVULA BIOPSY / EXCISION     PERICARDIAL WINDOW N/A 03/22/2020   Procedure: SUBXYPHOID POST-OP PERICARDIAL FLUID DRAINAGE WITH CHEST TUBE INSERTION.;  Surgeon: Grace Isaac, MD;  Location: [place];  Service: Thoracic;  Laterality: N/A;   REPAIR OF ACUTE ASCENDING THORACIC AORTIC DISSECTION N/A 03/11/2020   Procedure: REPAIR OF ACUTE ASCENDING THORACIC AORTIC DISSECTION USING A HEMASHIELD PLATINUM 26MM STRAIGHT GRAFT AND A HEASHIELD PLATINUM 28X10MM SINGLE ARM GRAFT;  Surgeon: Rexene Alberts, MD;  Location: [place];  Service: Open Heart Surgery;  Laterality: N/A;   RIGHT/LEFT HEART CATH AND CORONARY ANGIOGRAPHY N/A 02/21/2020   Procedure: RIGHT/LEFT HEART CATH AND CORONARY ANGIOGRAPHY;  Surgeon: Troy Sine, MD;  Location: [place] [facility];  Service: Cardiovascular;  Laterality: N/A;   TEE WITHOUT CARDIOVERSION N/A 12/19/2019   Procedure: TRANSESOPHAGEAL ECHOCARDIOGRAM (TEE);  Surgeon: Buford Dresser, MD;  Location: [facility] ENDOSCOPY;  Service: Cardiovascular;  Laterality: N/A;   TEE WITHOUT CARDIOVERSION N/A 03/11/2020   Procedure: TRANSESOPHAGEAL ECHOCARDIOGRAM (TEE);  Surgeon: Rexene Alberts, MD;  Location: [place];  Service: Open Heart Surgery;  Laterality: N/A;   TEE WITHOUT CARDIOVERSION N/A 03/22/2020   Procedure: TRANSESOPHAGEAL ECHOCARDIOGRAM (TEE);  Surgeon: Grace Isaac, MD;  Location: [place];  Service: Thoracic;  Laterality: N/A;   TONSILLECTOMY  VASECTOMY     Patient Active Problem List   Diagnosis Date Noted   Allergic rhinitis 11/08/2021   Presbycusis of both ears 10/21/2021   Long term (current) use of anticoagulants 04/27/2020   Atrial fibrillation ([place]) 04/27/2020   Anemia 04/21/2020   Peripheral edema    Hypothyroidism    S/P mitral valve repair 03/11/2020   S/P aortic dissection repair 03/11/2020   BPH (benign prostatic hyperplasia) 07/29/2014   Low serum vitamin D 03/14/2014   Hyperlipidemia    GERD (gastroesophageal reflux disease)  08/03/2012   Essential hypertension, benign 08/19/2008   DIVERTICULAR DISEASE 08/18/2008   BACK PAIN, CHRONIC 08/18/2008   OSA (obstructive sleep apnea) 08/18/2008   PREMATURE VENTRICULAR CONTRACTIONS 07/23/2008   Asthma-COPD overlap syndrome 07/23/2008     REFERRING PROVIDER: Melina Schools MD  REFERRING DIAG: Vertebrogenic low back pain  Rationale for Evaluation and Treatment: Rehabilitation  THERAPY DIAG:  Other low back pain  Muscle spasm of back  Abnormal posture  ONSET DATE: Since he was in his early 20's.  SUBJECTIVE:                                                                                                                                                                                           SUBJECTIVE STATEMENT: Did okay after last Rx. Both LE's  Quads/ hip flexors spasms   PERTINENT HISTORY:  HTN, Mitral valve repair.  PAIN:  Are you having pain? 2/10.  Lower lumbar.  "Spasm."  PRECAUTIONS: None  WEIGHT BEARING RESTRICTIONS: No  FALLS:  Has patient fallen in last 6 months? No  LIVING ENVIRONMENT: Lives with: lives with their spouse Lives in: House/apartment Has following equipment at home: None  OCCUPATION: Retired Administrator.  PLOF: Independent with basic ADLs  PATIENT GOALS: Regain strength/stamina while walking.    OBJECTIVE:   PATIENT SURVEYS:  FOTO 72.  MUSCLE LENGTH: Hamstrings: Tightness in hamstrings bilaterally.  POSTURE: rounded shoulders, forward head, decreased lumbar lordosis, and posterior pelvic tilt.  Scoliosis.   PALPATION: Diffuse tenderness over bilateral lower lumbar musculature and a very tender trigger point in his right low back musculature.    LUMBAR ROM:  Active lumbar flexion limited by 2/3's of full range and extension to ~5 degrees.   LOWER EXTREMITY MMT:    Normal bilateral LE strength.  LUMBAR SPECIAL TESTS:  Equal leg lengths.  Bilateral SLR testing remarkable for hamstring  stretching. GAIT: Flexed trunk posture.  TODAY'S TREATMENT:    06-02-22         AB bracing with transitions Discussed and reviewed Standing Modified bird dog. ARM raise x 6 hold 5 sec  each, leg raise x 6 each hold 5 secs Seated SOC for spine mobility Bil. Hip flexor stretching in standing with glute activation at wall x 6 each side hold 5-10 secs Discussed performing these several x daily especially after walking and sitting prolonged periods.  Modalities: IFC to LB with HMP x 15 mins. In hooklying                                                                                                                              PATIENT EDUCATION:  Education details: We  discussed correct posture which includes while sleeping (he sleeps on his side) with pillows between knees for correct hip alignment. Person educated: Patient Education method: Explanation Education comprehension: verbalized understanding  HOME EXERCISE PROGRAM: HOME EXERCISE PROGRAM Created by Mali Applegate Feb 19th, 2024 View at [email or web address] using code: [place]  Page 1 of 1 1 Exercise Standing lumbar extension Place elbows on wall. Allow hips to fall towards wall. Repeat 10 Times Hold 2 Seconds Complete 1 Set Perform 3 Times a Day  ASSESSMENT:  CLINICAL IMPRESSION: Pt arrived today doing fair , but reports having quad spasms and hip flexor tightening after sitting for a while. Rx focused on review of HEP as well as adding standing hip flexor stretching with glute activation which seemed to help with tightness and spasms.IFC was applied today and tolerated very well with normal response after removal of modality. Discussed  also trying [place] combo to LB next visit. No lying down exs given due to Pt reporting it is very hard for him to get out of the floor.    OBJECTIVE IMPAIRMENTS: decreased activity tolerance, decreased mobility, decreased ROM, increased muscle spasms, postural dysfunction, and pain.    ACTIVITY LIMITATIONS: carrying, lifting, bending, and transfers  PARTICIPATION LIMITATIONS: meal prep, cleaning, laundry, and yard work  PERSONAL FACTORS: Time since onset of injury/illness/exacerbation are also affecting patient's functional outcome.   REHAB POTENTIAL: Good  CLINICAL DECISION MAKING: Stable/uncomplicated  EVALUATION COMPLEXITY: Low   GOALS:  LONG TERM GOALS: Target date: 06/20/22.  Ind with a HEP.  Goal status: INITIAL  2.  Sit to stand with pain not > 3/10 and no radiation of pain into bilateral groin regions.  Goal status: INITIAL  3.  Perform ADL's with pain not > 3/10.  Goal status: INITIAL   PLAN:  PT FREQUENCY: 3x/week  PT DURATION: 4 weeks  PLANNED INTERVENTIONS: Therapeutic exercises, Therapeutic activity, Patient/Family education, Self Care, Dry Needling, Electrical stimulation, Cryotherapy, Moist heat, Traction, Ultrasound, and Manual therapy.  PLAN FOR NEXT SESSION: Modalities and STW/M.  Core exercise progression.  Spinal protection/ body mechanics techniques.   Keeara Frees,CHRIS, PTA 06/02/2022, 5:28 PM

## 2022-06-09 ENCOUNTER — Other Ambulatory Visit: Payer: Self-pay | Admitting: Family Medicine

## 2022-06-09 ENCOUNTER — Ambulatory Visit: Payer: Medicare Other | Attending: Orthopedic Surgery | Admitting: Physical Therapy

## 2022-06-09 DIAGNOSIS — M62830 Muscle spasm of back: Secondary | ICD-10-CM | POA: Diagnosis not present

## 2022-06-09 DIAGNOSIS — M5459 Other low back pain: Secondary | ICD-10-CM | POA: Diagnosis not present

## 2022-06-09 DIAGNOSIS — R293 Abnormal posture: Secondary | ICD-10-CM | POA: Diagnosis not present

## 2022-06-09 NOTE — Therapy (Signed)
OUTPATIENT PHYSICAL THERAPY THORACOLUMBAR EVALUATION   Patient Name: Patrick Bell MRN: [id number] DOB:[birth year], 82 y.o., male Today's Date: 06/09/2022  END OF SESSION:  PT End of Session - 06/09/22 1625     Visit Number 4    Number of Visits 12    Date for PT Re-Evaluation 06/20/22    Authorization Type FOTO AT LEAST EVERY 5TH VISIT.  PROGRESS NOTE AT 10TH VISIT.  KX MODIFIER AFTER 15 VISITS.    PT Start Time 0226    Activity Tolerance Patient tolerated treatment well    Behavior During Therapy [facility] for tasks assessed/performed              Past Medical History:  Diagnosis Date   Acute thoracic aortic dissection ([place]) 03/11/2020   Type A   Allergy    Asthma    Cataract    Cataract    Diverticulitis    Dyspnea    GERD (gastroesophageal reflux disease)    Heart murmur 12/2019   Hyperlipidemia    Hypertension    Inguinal hernia bilateral, non-recurrent    S/P aortic dissection repair 03/11/2020   supracoronary straight graft repair with resuspension of native aortic valve and open distal anastomosis for intraoperative acute type A aortic dissection    S/P mitral valve repair 03/11/2020   Complex valvuloplasty including artificial Gore-tex neochord placement x 4, suture plication of posterior leaflet and posteromedial commissure and 32 mm Sorin Memo 4D ring annuloplasty   Sleep apnea    Past Surgical History:  Procedure Laterality Date   bilateral inguinal hernia     CARDIAC CATHETERIZATION Bilateral 02/21/2020   CHEST TUBE INSERTION N/A 03/22/2020   Procedure: CHEST TUBE INSERTION OF 28 BLAKE DRAIN.;  Surgeon: Grace Isaac, MD;  Location: [place] [facility];  Service: Thoracic;  Laterality: N/A;   COLONOSCOPY  06/09/2004   [id number] colon diverticulosis, otherwise normal colonoscopy   EYE SURGERY  08/2014   HERNIA REPAIR     MITRAL VALVE REPAIR N/A 03/11/2020   Procedure: MITRAL VALVE REPAIR (MVR) USING MEMO 4D SIZE 32 RING;  Surgeon: Rexene Alberts, MD;  Location:  [place];  Service: Open Heart Surgery;  Laterality: N/A;   PALATE / UVULA BIOPSY / EXCISION     PERICARDIAL WINDOW N/A 03/22/2020   Procedure: SUBXYPHOID POST-OP PERICARDIAL FLUID DRAINAGE WITH CHEST TUBE INSERTION.;  Surgeon: Grace Isaac, MD;  Location: [place];  Service: Thoracic;  Laterality: N/A;   REPAIR OF ACUTE ASCENDING THORACIC AORTIC DISSECTION N/A 03/11/2020   Procedure: REPAIR OF ACUTE ASCENDING THORACIC AORTIC DISSECTION USING A HEMASHIELD PLATINUM 26MM STRAIGHT GRAFT AND A HEASHIELD PLATINUM 28X10MM SINGLE ARM GRAFT;  Surgeon: Rexene Alberts, MD;  Location: [place];  Service: Open Heart Surgery;  Laterality: N/A;   RIGHT/LEFT HEART CATH AND CORONARY ANGIOGRAPHY N/A 02/21/2020   Procedure: RIGHT/LEFT HEART CATH AND CORONARY ANGIOGRAPHY;  Surgeon: Troy Sine, MD;  Location: [place] [facility];  Service: Cardiovascular;  Laterality: N/A;   TEE WITHOUT CARDIOVERSION N/A 12/19/2019   Procedure: TRANSESOPHAGEAL ECHOCARDIOGRAM (TEE);  Surgeon: Buford Dresser, MD;  Location: [facility];  Service: Cardiovascular;  Laterality: N/A;   TEE WITHOUT CARDIOVERSION N/A 03/11/2020   Procedure: TRANSESOPHAGEAL ECHOCARDIOGRAM (TEE);  Surgeon: Rexene Alberts, MD;  Location: [place];  Service: Open Heart Surgery;  Laterality: N/A;   TEE WITHOUT CARDIOVERSION N/A 03/22/2020   Procedure: TRANSESOPHAGEAL ECHOCARDIOGRAM (TEE);  Surgeon: Grace Isaac, MD;  Location: [place];  Service: Thoracic;  Laterality: N/A;  TONSILLECTOMY     VASECTOMY     Patient Active Problem List   Diagnosis Date Noted   Allergic rhinitis 11/08/2021   Presbycusis of both ears 10/21/2021   Long term (current) use of anticoagulants 04/27/2020   Atrial fibrillation ([place]) 04/27/2020   Anemia 04/21/2020   Peripheral edema    Hypothyroidism    S/P mitral valve repair 03/11/2020   S/P aortic dissection repair 03/11/2020   BPH (benign prostatic hyperplasia) 07/29/2014   Low serum vitamin D 03/14/2014    Hyperlipidemia    GERD (gastroesophageal reflux disease) 08/03/2012   Essential hypertension, benign 08/19/2008   DIVERTICULAR DISEASE 08/18/2008   BACK PAIN, CHRONIC 08/18/2008   OSA (obstructive sleep apnea) 08/18/2008   PREMATURE VENTRICULAR CONTRACTIONS 07/23/2008   Asthma-COPD overlap syndrome 07/23/2008     REFERRING PROVIDER: Melina Schools MD  REFERRING DIAG: Vertebrogenic low back pain  Rationale for Evaluation and Treatment: Rehabilitation  THERAPY DIAG:  Other low back pain  Muscle spasm of back  Abnormal posture  ONSET DATE: Since he was in his early 20's.  SUBJECTIVE:                                                                                                                                                                                           SUBJECTIVE STATEMENT: Did okay after last Rx. Both LE's  Quads/ hip flexors spasms   PERTINENT HISTORY:  HTN, Mitral valve repair.  PAIN:  Are you having pain? 2-3/10.  Lower lumbar.  "Spasm."  PRECAUTIONS: None  WEIGHT BEARING RESTRICTIONS: No  FALLS:  Has patient fallen in last 6 months? No  LIVING ENVIRONMENT: Lives with: lives with their spouse Lives in: House/apartment Has following equipment at home: None  OCCUPATION: Retired Administrator.  PLOF: Independent with basic ADLs  PATIENT GOALS: Regain strength/stamina while walking.    OBJECTIVE:   PATIENT SURVEYS:  FOTO 74.  MUSCLE LENGTH: Hamstrings: Tightness in hamstrings bilaterally.  POSTURE: rounded shoulders, forward head, decreased lumbar lordosis, and posterior pelvic tilt.  Scoliosis.   PALPATION: Diffuse tenderness over bilateral lower lumbar musculature and a very tender trigger point in his right low back musculature.    LUMBAR ROM:  Active lumbar flexion limited by 2/3's of full range and extension to ~5 degrees.   LOWER EXTREMITY MMT:    Normal bilateral LE strength.  LUMBAR SPECIAL TESTS:  Equal leg lengths.   Bilateral SLR testing remarkable for hamstring stretching. GAIT: Flexed trunk posture.  TODAY'S TREATMENT:    06/09/22        Patient in right sdly position with folded pillow between knees for comfort:  Combo  e'stim/US at 1.50 W/CM2 x 12 minutes f/b STW/M x 12 minutes to patient's right lumbar musculature and ischemic release technique to right QL f/b HMP and IFC at 80-150 Hz on 40% scan x 20 minutes in supine with wedge under knees for comfort.  Patient tolerated treatment without complaint with normal modality response following removal of modality.                                                                                                                        PATIENT EDUCATION:  Education details: We  discussed correct posture which includes while sleeping (he sleeps on his side) with pillows between knees for correct hip alignment. Person educated: Patient Education method: Explanation Education comprehension: verbalized understanding  HOME EXERCISE PROGRAM: HOME EXERCISE PROGRAM Created by Mali Theta Leaf Feb 19th, 2024 View at [email or web address] using code: [place]  Page 1 of 1 1 Exercise Standing lumbar extension Place elbows on wall. Allow hips to fall towards wall. Repeat 10 Times Hold 2 Seconds Complete 1 Set Perform 3 Times a Day  ASSESSMENT:  CLINICAL IMPRESSION: Patient with increased tone and tenderness over his right lumbar musculature.  Good response to STW/M today.      OBJECTIVE IMPAIRMENTS: decreased activity tolerance, decreased mobility, decreased ROM, increased muscle spasms, postural dysfunction, and pain.   ACTIVITY LIMITATIONS: carrying, lifting, bending, and transfers  PARTICIPATION LIMITATIONS: meal prep, cleaning, laundry, and yard work  PERSONAL FACTORS: Time since onset of injury/illness/exacerbation are also affecting patient's functional outcome.   REHAB POTENTIAL: Good  CLINICAL DECISION MAKING:  Stable/uncomplicated  EVALUATION COMPLEXITY: Low   GOALS:  LONG TERM GOALS: Target date: 06/20/22.  Ind with a HEP.  Goal status: INITIAL  2.  Sit to stand with pain not > 3/10 and no radiation of pain into bilateral groin regions.  Goal status: INITIAL  3.  Perform ADL's with pain not > 3/10.  Goal status: INITIAL   PLAN:  PT FREQUENCY: 3x/week  PT DURATION: 4 weeks  PLANNED INTERVENTIONS: Therapeutic exercises, Therapeutic activity, Patient/Family education, Self Care, Dry Needling, Electrical stimulation, Cryotherapy, Moist heat, Traction, Ultrasound, and Manual therapy.  PLAN FOR NEXT SESSION: Modalities and STW/M.  Core exercise progression.  Spinal protection/ body mechanics techniques.   Alani Sabbagh, [place], PT 06/09/2022, 4:28 PM

## 2022-06-10 ENCOUNTER — Other Ambulatory Visit: Payer: Self-pay | Admitting: Cardiovascular Disease

## 2022-06-13 ENCOUNTER — Ambulatory Visit: Payer: Medicare Other | Attending: Cardiology | Admitting: *Deleted

## 2022-06-13 ENCOUNTER — Telehealth: Payer: Self-pay

## 2022-06-13 DIAGNOSIS — I71019 Dissection of thoracic aorta, unspecified: Secondary | ICD-10-CM

## 2022-06-13 DIAGNOSIS — Z5181 Encounter for therapeutic drug level monitoring: Secondary | ICD-10-CM

## 2022-06-13 DIAGNOSIS — M2042 Other hammer toe(s) (acquired), left foot: Secondary | ICD-10-CM | POA: Diagnosis not present

## 2022-06-13 DIAGNOSIS — Z9889 Other specified postprocedural states: Secondary | ICD-10-CM

## 2022-06-13 DIAGNOSIS — L84 Corns and callosities: Secondary | ICD-10-CM | POA: Diagnosis not present

## 2022-06-13 LAB — POCT INR: POC INR: 2.7

## 2022-06-13 NOTE — Patient Instructions (Signed)
Description   Continue taking warfarin 1 tablet daily except 1/2 tablet each Sundays, Wednesdays, and Fridays. Repeat INR in 4 weeks. Anticoagulation Clinic [phone]

## 2022-06-13 NOTE — Telephone Encounter (Signed)
Need anesthesia type.    Attempted to reach the office but the message said the line was down and they hoping the lines will be up soon.

## 2022-06-14 ENCOUNTER — Telehealth: Payer: Self-pay | Admitting: *Deleted

## 2022-06-14 NOTE — Telephone Encounter (Signed)
Patient with diagnosis of afib on warfarin for anticoagulation.    Procedure: left foot 3rd toenail excision, hammertoe correction Date of procedure: TBD  CHA2DS2-VASc Score = 3  This indicates a 3.2% annual risk of stroke. The patient's score is based upon: CHF History: 0 HTN History: 1 Diabetes History: 0 Stroke History: 0 Vascular Disease History: 0 Age Score: 2 Gender Score: 0  CrCl 31m/min Platelet count 214K  Per office protocol, patient can hold warfarin for 5 days prior to procedure. Patient will not need bridging with Lovenox (enoxaparin) around procedure.  **This guidance is not considered finalized until pre-operative APP has relayed final recommendations.**

## 2022-06-14 NOTE — Telephone Encounter (Signed)
   Pre-operative Risk Assessment    Patient Name: Patrick Bell  DOB: 04/03/1941 MRN: [id number]      Request for Surgical Clearance    Procedure:   LEFT FOOT THIRD TOE NAIL EXCISION; HAMMERTOE CORRECTION  Date of Surgery:  Clearance TBD                                 Surgeon:  DR. Jenny Reichmann HEWITT Surgeon's Group or Practice Name:  [facility] Phone number:  [phone] ATTN: [place] Fax number:  [phone]   Type of Clearance Requested:   - Medical  - Pharmacy:  Hold Warfarin (Coumadin)     Type of Anesthesia:   CHOICE   Additional requests/questions:    Jiles Prows   06/14/2022, 10:37 AM

## 2022-06-14 NOTE — Telephone Encounter (Signed)
Pharmacy please advise on holding Coumadin prior to nail excision and hammertoe correction of third toe of left foot scheduled for TBD. Thank you.

## 2022-06-14 NOTE — Telephone Encounter (Signed)
Pt has been scheduled for tele pre op appt 06/20/22 @ 2:40. Med rec and consent are done.

## 2022-06-14 NOTE — Telephone Encounter (Signed)
Pt has been scheduled for tele pre op appt 06/20/22 @ 2:40. Med rec and consent are done.      Patient Consent for Virtual Visit        Patrick Bell has provided verbal consent on 06/14/2022 for a virtual visit (video or telephone).   CONSENT FOR VIRTUAL VISIT FOR:  Patrick Bell  By participating in this virtual visit I agree to the following:  I hereby voluntarily request, consent and authorize [facility] and its employed or contracted physicians, physician assistants, nurse practitioners or other licensed health care professionals (the Practitioner), to provide me with telemedicine health care services (the "Services") as deemed necessary by the treating Practitioner. I acknowledge and consent to receive the Services by the Practitioner via telemedicine. I understand that the telemedicine visit will involve communicating with the Practitioner through live audiovisual communication technology and the disclosure of certain medical information by electronic transmission. I acknowledge that I have been given the opportunity to request an in-person assessment or other available alternative prior to the telemedicine visit and am voluntarily participating in the telemedicine visit.  I understand that I have the right to withhold or withdraw my consent to the use of telemedicine in the course of my care at any time, without affecting my right to future care or treatment, and that the Practitioner or I may terminate the telemedicine visit at any time. I understand that I have the right to inspect all information obtained and/or recorded in the course of the telemedicine visit and may receive copies of available information for a reasonable fee.  I understand that some of the potential risks of receiving the Services via telemedicine include:  Delay or interruption in medical evaluation due to technological equipment failure or disruption; Information transmitted may not be sufficient (e.g.  poor resolution of images) to allow for appropriate medical decision making by the Practitioner; and/or  In rare instances, security protocols could fail, causing a breach of personal health information.  Furthermore, I acknowledge that it is my responsibility to provide information about my medical history, conditions and care that is complete and accurate to the best of my ability. I acknowledge that Practitioner's advice, recommendations, and/or decision may be based on factors not within their control, such as incomplete or inaccurate data provided by me or distortions of diagnostic images or specimens that may result from electronic transmissions. I understand that the practice of medicine is not an exact science and that Practitioner makes no warranties or guarantees regarding treatment outcomes. I acknowledge that a copy of this consent can be made available to me via my patient portal ([facility]), or I can request a printed copy by calling the office of [facility].    I understand that my insurance will be billed for this visit.   I have read or had this consent read to me. I understand the contents of this consent, which adequately explains the benefits and risks of the Services being provided via telemedicine.  I have been provided ample opportunity to ask questions regarding this consent and the Services and have had my questions answered to my satisfaction. I give my informed consent for the services to be provided through the use of telemedicine in my medical care

## 2022-06-14 NOTE — Telephone Encounter (Signed)
   Name: Patrick Bell  DOB: [birth year]  MRN: [id number]  Primary Cardiologist: Shelva Majestic, MD   Preoperative team, please contact this patient and set up a phone call appointment for further preoperative risk assessment. Please obtain consent and complete medication review. Thank you for your help.  I confirm that guidance regarding antiplatelet and oral anticoagulation therapy has been completed and, if necessary, noted below.    Per office protocol, patient can hold warfarin for 5 days prior to procedure. Patient will not need bridging with Lovenox (enoxaparin) around procedure.    Mable Fill, Marissa Nestle, NP 06/14/2022, 1:43 PM [place]

## 2022-06-16 ENCOUNTER — Ambulatory Visit: Payer: Medicare Other | Admitting: *Deleted

## 2022-06-16 DIAGNOSIS — M62830 Muscle spasm of back: Secondary | ICD-10-CM

## 2022-06-16 DIAGNOSIS — M5459 Other low back pain: Secondary | ICD-10-CM | POA: Diagnosis not present

## 2022-06-16 DIAGNOSIS — R293 Abnormal posture: Secondary | ICD-10-CM | POA: Diagnosis not present

## 2022-06-16 NOTE — Therapy (Signed)
OUTPATIENT PHYSICAL THERAPY THORACOLUMBAR EVALUATION   Patient Name: Patrick Bell MRN: [id number] DOB:[birth year], 82 y.o., male Today's Date: 06/16/2022  END OF SESSION:  PT End of Session - 06/16/22 1433     Visit Number 5    Number of Visits 12    Date for PT Re-Evaluation 06/20/22    Authorization Type FOTO AT LEAST EVERY 5TH VISIT.  PROGRESS NOTE AT 10TH VISIT.  KX MODIFIER AFTER 15 VISITS.    PT Start Time 1430    PT Stop Time 1520    PT Time Calculation (min) 50 min              Past Medical History:  Diagnosis Date   Acute thoracic aortic dissection ([facility]) 03/11/2020   Type A   Allergy    Asthma    Cataract    Cataract    Diverticulitis    Dyspnea    GERD (gastroesophageal reflux disease)    Heart murmur 12/2019   Hyperlipidemia    Hypertension    Inguinal hernia bilateral, non-recurrent    S/P aortic dissection repair 03/11/2020   supracoronary straight graft repair with resuspension of native aortic valve and open distal anastomosis for intraoperative acute type A aortic dissection    S/P mitral valve repair 03/11/2020   Complex valvuloplasty including artificial Gore-tex neochord placement x 4, suture plication of posterior leaflet and posteromedial commissure and 32 mm Sorin Memo 4D ring annuloplasty   Sleep apnea    Past Surgical History:  Procedure Laterality Date   bilateral inguinal hernia     CARDIAC CATHETERIZATION Bilateral 02/21/2020   CHEST TUBE INSERTION N/A 03/22/2020   Procedure: CHEST TUBE INSERTION OF 28 BLAKE DRAIN.;  Surgeon: Grace Isaac, MD;  Location: [place] [facility];  Service: Thoracic;  Laterality: N/A;   COLONOSCOPY  06/09/2004   [id number] colon diverticulosis, otherwise normal colonoscopy   EYE SURGERY  08/2014   HERNIA REPAIR     MITRAL VALVE REPAIR N/A 03/11/2020   Procedure: MITRAL VALVE REPAIR (MVR) USING MEMO 4D SIZE 32 RING;  Surgeon: Rexene Alberts, MD;  Location: [place];  Service: Open Heart Surgery;  Laterality: N/A;    PALATE / UVULA BIOPSY / EXCISION     PERICARDIAL WINDOW N/A 03/22/2020   Procedure: SUBXYPHOID POST-OP PERICARDIAL FLUID DRAINAGE WITH CHEST TUBE INSERTION.;  Surgeon: Grace Isaac, MD;  Location: [place];  Service: Thoracic;  Laterality: N/A;   REPAIR OF ACUTE ASCENDING THORACIC AORTIC DISSECTION N/A 03/11/2020   Procedure: REPAIR OF ACUTE ASCENDING THORACIC AORTIC DISSECTION USING A HEMASHIELD PLATINUM 26MM STRAIGHT GRAFT AND A HEASHIELD PLATINUM 28X10MM SINGLE ARM GRAFT;  Surgeon: Rexene Alberts, MD;  Location: [place];  Service: Open Heart Surgery;  Laterality: N/A;   RIGHT/LEFT HEART CATH AND CORONARY ANGIOGRAPHY N/A 02/21/2020   Procedure: RIGHT/LEFT HEART CATH AND CORONARY ANGIOGRAPHY;  Surgeon: Troy Sine, MD;  Location: [place] [facility];  Service: Cardiovascular;  Laterality: N/A;   TEE WITHOUT CARDIOVERSION N/A 12/19/2019   Procedure: TRANSESOPHAGEAL ECHOCARDIOGRAM (TEE);  Surgeon: Buford Dresser, MD;  Location: [facility]OSCOPY;  Service: Cardiovascular;  Laterality: N/A;   TEE WITHOUT CARDIOVERSION N/A 03/11/2020   Procedure: TRANSESOPHAGEAL ECHOCARDIOGRAM (TEE);  Surgeon: Rexene Alberts, MD;  Location: [place];  Service: Open Heart Surgery;  Laterality: N/A;   TEE WITHOUT CARDIOVERSION N/A 03/22/2020   Procedure: TRANSESOPHAGEAL ECHOCARDIOGRAM (TEE);  Surgeon: Grace Isaac, MD;  Location: [place];  Service: Thoracic;  Laterality: N/A;   TONSILLECTOMY  VASECTOMY     Patient Active Problem List   Diagnosis Date Noted   Allergic rhinitis 11/08/2021   Presbycusis of both ears 10/21/2021   Long term (current) use of anticoagulants 04/27/2020   Atrial fibrillation ([place]) 04/27/2020   Anemia 04/21/2020   Peripheral edema    Hypothyroidism    S/P mitral valve repair 03/11/2020   S/P aortic dissection repair 03/11/2020   BPH (benign prostatic hyperplasia) 07/29/2014   Low serum vitamin D 03/14/2014   Hyperlipidemia    GERD (gastroesophageal reflux disease)  08/03/2012   Essential hypertension, benign 08/19/2008   DIVERTICULAR DISEASE 08/18/2008   BACK PAIN, CHRONIC 08/18/2008   OSA (obstructive sleep apnea) 08/18/2008   PREMATURE VENTRICULAR CONTRACTIONS 07/23/2008   Asthma-COPD overlap syndrome 07/23/2008     REFERRING PROVIDER: Melina Schools MD  REFERRING DIAG: Vertebrogenic low back pain  Rationale for Evaluation and Treatment: Rehabilitation  THERAPY DIAG:  Other low back pain  Abnormal posture  Muscle spasm of back  ONSET DATE: Since he was in his early 20's.  SUBJECTIVE:                                                                                                                                                                                           SUBJECTIVE STATEMENT: Did good  after last Rx, but pain  today on LT side   PERTINENT HISTORY:  HTN, Mitral valve repair.  PAIN:  Are you having pain? 2-3/10.  Lower lumbar.  "Spasm."  PRECAUTIONS: None  WEIGHT BEARING RESTRICTIONS: No  FALLS:  Has patient fallen in last 6 months? No  LIVING ENVIRONMENT: Lives with: lives with their spouse Lives in: House/apartment Has following equipment at home: None  OCCUPATION: Retired Administrator.  PLOF: Independent with basic ADLs  PATIENT GOALS: Regain strength/stamina while walking.    OBJECTIVE:   PATIENT SURVEYS:  FOTO 31.  MUSCLE LENGTH: Hamstrings: Tightness in hamstrings bilaterally.  POSTURE: rounded shoulders, forward head, decreased lumbar lordosis, and posterior pelvic tilt.  Scoliosis.   PALPATION: Diffuse tenderness over bilateral lower lumbar musculature and a very tender trigger point in his right low back musculature.    LUMBAR ROM:  Active lumbar flexion limited by 2/3's of full range and extension to ~5 degrees.   LOWER EXTREMITY MMT:    Normal bilateral LE strength.  LUMBAR SPECIAL TESTS:  Equal leg lengths.  Bilateral SLR testing remarkable for hamstring  stretching. GAIT: Flexed trunk posture.  TODAY'S TREATMENT:    06/16/22        Patient in prone  position with  pillow  under his stomach for comfort:  Combo e'stim/US at  1.50 W/CM2 x 20 minutes (10 mins x 2 areas) LT Upeer glute and Lumbar paras  STW/M x 12 minutes to patient's LT lumbar musculature and glut with ischemic release technique prone  HMP and IFC at 80-150 Hz on 40% scan x 20 minutes in supine with wedge under knees for comfort.  Patient tolerated treatment without complaint with normal modality response following removal of modality.                                                                                                                        PATIENT EDUCATION:  Education details: We  discussed correct posture which includes while sleeping (he sleeps on his side) with pillows between knees for correct hip alignment. Person educated: Patient Education method: Explanation Education comprehension: verbalized understanding  HOME EXERCISE PROGRAM: HOME EXERCISE PROGRAM Created by Mali Applegate Feb 19th, 2024 View at [email or web address] using code: [place]  Page 1 of 1 1 Exercise Standing lumbar extension Place elbows on wall. Allow hips to fall towards wall. Repeat 10 Times Hold 2 Seconds Complete 1 Set Perform 3 Times a Day  ASSESSMENT:  CLINICAL IMPRESSION:FOTO completed Patient  arrived today doing better with decreased pain on his RT side , but ports increased pain on the LT side LB . Rx focused on LT side LB paras as well as upper glut with Pt prone. Notable  increased tone and tenderness over his LT lumbar musculature, but with good release end of session. Kermit Balo response to STW/M today.      OBJECTIVE IMPAIRMENTS: decreased activity tolerance, decreased mobility, decreased ROM, increased muscle spasms, postural dysfunction, and pain.   ACTIVITY LIMITATIONS: carrying, lifting, bending, and transfers  PARTICIPATION LIMITATIONS: meal prep,  cleaning, laundry, and yard work  PERSONAL FACTORS: Time since onset of injury/illness/exacerbation are also affecting patient's functional outcome.   REHAB POTENTIAL: Good  CLINICAL DECISION MAKING: Stable/uncomplicated  EVALUATION COMPLEXITY: Low   GOALS:  LONG TERM GOALS: Target date: 06/20/22.  Ind with a HEP.  Goal status: INITIAL  2.  Sit to stand with pain not > 3/10 and no radiation of pain into bilateral groin regions.  Goal status: INITIAL  3.  Perform ADL's with pain not > 3/10.  Goal status: INITIAL   PLAN:  PT FREQUENCY: 3x/week  PT DURATION: 4 weeks  PLANNED INTERVENTIONS: Therapeutic exercises, Therapeutic activity, Patient/Family education, Self Care, Dry Needling, Electrical stimulation, Cryotherapy, Moist heat, Traction, Ultrasound, and Manual therapy.  PLAN FOR NEXT SESSION: Modalities and STW/M.  Core exercise progression.  Spinal protection/ body mechanics techniques.   Koriana Stepien,CHRIS, PTA 06/16/2022, 3:41 PM

## 2022-06-20 ENCOUNTER — Ambulatory Visit: Payer: Medicare Other

## 2022-06-23 ENCOUNTER — Encounter: Payer: Medicare Other | Admitting: *Deleted

## 2022-06-24 ENCOUNTER — Telehealth (INDEPENDENT_AMBULATORY_CARE_PROVIDER_SITE_OTHER): Payer: Medicare Other | Admitting: Nurse Practitioner

## 2022-06-24 DIAGNOSIS — G8929 Other chronic pain: Secondary | ICD-10-CM | POA: Diagnosis not present

## 2022-06-24 DIAGNOSIS — M545 Low back pain, unspecified: Secondary | ICD-10-CM | POA: Diagnosis not present

## 2022-06-24 MED ORDER — METHOCARBAMOL 500 MG PO TABS: 500.0000 mg | ORAL_TABLET | Freq: Four times a day (QID) | ORAL | 0 refills | Status: DC

## 2022-06-24 NOTE — Patient Instructions (Signed)
[name], thank you for joining Chevis Pretty, FNP for today's virtual visit.  While this provider is not your primary care provider (PCP), if your PCP is located in our provider database this encounter information will be shared with them immediately following your visit.   [facility] account gives you access to today's visit and all your visits, tests, and labs performed at [facility] " click here if you don't have a [facility] account or go to [email or web address]  Consent: (Patient) [name] provided verbal consent for this virtual visit at the beginning of the encounter.  Current Medications:  Current Outpatient Medications:    methocarbamol (ROBAXIN) 500 MG tablet, Take 1 tablet (500 mg total) by mouth 4 (four) times daily., Disp: 30 tablet, Rfl: 0   acetaminophen (TYLENOL) 325 MG tablet, Take 1-2 tablets (325-650 mg total) by mouth every 4 (four) hours as needed for mild pain., Disp: , Rfl:    albuterol (VENTOLIN HFA) 108 (90 Base) MCG/ACT inhaler, INHALE 2 PUFFS EVERY 6 HOURS AS NEEDED FOR WHEEZING OR SHORTNESS OF BREATH, Disp: 8.5 g, Rfl: 2   amLODipine (NORVASC) 10 MG tablet, Take 1 tablet (10 mg total) by mouth daily., Disp: 90 tablet, Rfl: 3   guaiFENesin (MUCINEX) 600 MG 12 hr tablet, Take 600 mg by mouth every 4 (four) hours as needed for cough or to loosen phlegm., Disp: , Rfl:    ipratropium (ATROVENT) 0.03 % nasal spray, , Disp: , Rfl:    levothyroxine (SYNTHROID) 50 MCG tablet, Take 1 tablet (50 mcg total) by mouth daily., Disp: 90 tablet, Rfl: 3   metoprolol succinate (TOPROL-XL) 50 MG 24 hr tablet, TAKE ONE TABLET AT BEDTIME, Disp: 90 tablet, Rfl: 1   montelukast (SINGULAIR) 10 MG tablet, TAKE 1 TABLET DAILY, Disp: 90 tablet, Rfl: 0   Olopatadine HCl 0.2 % SOLN, Place 1 drop into both eyes daily as needed (allergies)., Disp: , Rfl:    pantoprazole (PROTONIX) 40 MG tablet, TAKE 1 TABLET DAILY, Disp: 90 tablet, Rfl:  3   rosuvastatin (CRESTOR) 20 MG tablet, Take 1 tablet (20 mg total) by mouth daily., Disp: 90 tablet, Rfl: 3   warfarin (COUMADIN) 5 MG tablet, TAKE ONE TABLET ONCE DAILY AT 4PM, Disp: 90 tablet, Rfl: 3   Medications ordered in this encounter:  Meds ordered this encounter  Medications   methocarbamol (ROBAXIN) 500 MG tablet    Sig: Take 1 tablet (500 mg total) by mouth 4 (four) times daily.    Dispense:  30 tablet    Refill:  0    Order Specific Question:   Supervising Provider    Answer:   Caryl Pina A [id number]     *If you need refills on other medications prior to your next appointment, please contact your pharmacy*  Follow-Up: Call back or seek an in-person evaluation if the symptoms worsen or if the condition fails to improve as anticipated.  [place] [phone]  Other Instructions    If you have been instructed to have an in-person evaluation today at a local Urgent Care facility, please use the link below. It will take you to a list of all of our available [facility], including address, phone number and hours of operation. Please do not delay care.  [facility]  If you or a family member do not have a primary care provider, use the link below to schedule a visit and establish care. When you  choose a [facility] primary care physician or advanced practice provider, you gain a long-term partner in health. Find a Primary Care Provider  Learn more about [facility]'s in-office and virtual care options: [facility]

## 2022-06-24 NOTE — Progress Notes (Signed)
Virtual Visit Consent   Patrick Bell, you are scheduled for a virtual visit with Patrick Bell, Lucien, a [facility] provider, today.     Just as with appointments in the office, your consent must be obtained to participate.  Your consent will be active for this visit and any virtual visit you may have with one of our providers in the next 365 days.     If you have a MyChart account, a copy of this consent can be sent to you electronically.  All virtual visits are billed to your insurance company just like a traditional visit in the office.    As this is a virtual visit, video technology does not allow for your provider to perform a traditional examination.  This may limit your provider's ability to fully assess your condition.  If your provider identifies any concerns that need to be evaluated in person or the need to arrange testing (such as labs, EKG, etc.), we will make arrangements to do so.     Although advances in technology are sophisticated, we cannot ensure that it will always work on either your end or our end.  If the connection with a video visit is poor, the visit may have to be switched to a telephone visit.  With either a video or telephone visit, we are not always able to ensure that we have a secure connection.     I need to obtain your verbal consent now.   Are you willing to proceed with your visit today? YES   ZAYEN MCWILLIAMS has provided verbal consent on 06/24/2022 for a virtual visit (video or telephone).   Patrick Hassell Done, FNP   Date: 06/24/2022 1:27 PM   Virtual Visit via Video Note   I, Patrick Bell, connected with Patrick Bell ([id number], June 29, 1940) on 06/24/22 at  4:45 PM EDT by a video-enabled telemedicine application and verified that I am speaking with the correct person using two identifiers.  Location: Patient: Virtual Visit Location Patient: Home Provider: Virtual Visit Location Provider: Mobile   I discussed the limitations  of evaluation and management by telemedicine and the availability of in person appointments. The patient expressed understanding and agreed to proceed.    History of Present Illness: Patrick Bell is a 82 y.o. who identifies as a male who was assigned male at birth, and is being seen today for back pain.  HPI: Has been having back pain for several months. He has been going to rehab. Having bad muscle spasms  Back Pain This is a chronic problem. The current episode started more than 1 month ago. The problem occurs constantly. The problem has been waxing and waning since onset. The pain is present in the lumbar spine. The quality of the pain is described as stabbing and shooting. The pain is at a severity of 8/10. The pain is moderate. The pain is Worse during the day. The symptoms are aggravated by bending, sitting and lying down. Stiffness is present All day. He has tried heat and ice for the symptoms. The treatment provided mild relief.    Review of Systems  Musculoskeletal:  Positive for back pain.    Problems:  Patient Active Problem List   Diagnosis Date Noted   Allergic rhinitis 11/08/2021   Presbycusis of both ears 10/21/2021   Long term (current) use of anticoagulants 04/27/2020   Atrial fibrillation ([place]) 04/27/2020   Anemia 04/21/2020   Peripheral edema    Hypothyroidism    S/P  mitral valve repair 03/11/2020   S/P aortic dissection repair 03/11/2020   BPH (benign prostatic hyperplasia) 07/29/2014   Low serum vitamin D 03/14/2014   Hyperlipidemia    GERD (gastroesophageal reflux disease) 08/03/2012   Essential hypertension, benign 08/19/2008   DIVERTICULAR DISEASE 08/18/2008   BACK PAIN, CHRONIC 08/18/2008   OSA (obstructive sleep apnea) 08/18/2008   PREMATURE VENTRICULAR CONTRACTIONS 07/23/2008   Asthma-COPD overlap syndrome 07/23/2008    Allergies: No Known Allergies Medications:  Current Outpatient Medications:    acetaminophen (TYLENOL) 325 MG tablet, Take 1-2  tablets (325-650 mg total) by mouth every 4 (four) hours as needed for mild pain., Disp: , Rfl:    albuterol (VENTOLIN HFA) 108 (90 Base) MCG/ACT inhaler, INHALE 2 PUFFS EVERY 6 HOURS AS NEEDED FOR WHEEZING OR SHORTNESS OF BREATH, Disp: 8.5 g, Rfl: 2   amLODipine (NORVASC) 10 MG tablet, Take 1 tablet (10 mg total) by mouth daily., Disp: 90 tablet, Rfl: 3   guaiFENesin (MUCINEX) 600 MG 12 hr tablet, Take 600 mg by mouth every 4 (four) hours as needed for cough or to loosen phlegm., Disp: , Rfl:    ipratropium (ATROVENT) 0.03 % nasal spray, , Disp: , Rfl:    levothyroxine (SYNTHROID) 50 MCG tablet, Take 1 tablet (50 mcg total) by mouth daily., Disp: 90 tablet, Rfl: 3   metoprolol succinate (TOPROL-XL) 50 MG 24 hr tablet, TAKE ONE TABLET AT BEDTIME, Disp: 90 tablet, Rfl: 1   montelukast (SINGULAIR) 10 MG tablet, TAKE 1 TABLET DAILY, Disp: 90 tablet, Rfl: 0   Olopatadine HCl 0.2 % SOLN, Place 1 drop into both eyes daily as needed (allergies)., Disp: , Rfl:    pantoprazole (PROTONIX) 40 MG tablet, TAKE 1 TABLET DAILY, Disp: 90 tablet, Rfl: 3   rosuvastatin (CRESTOR) 20 MG tablet, Take 1 tablet (20 mg total) by mouth daily., Disp: 90 tablet, Rfl: 3   warfarin (COUMADIN) 5 MG tablet, TAKE ONE TABLET ONCE DAILY AT 4PM, Disp: 90 tablet, Rfl: 3  Observations/Objective: Patient is well-developed, well-nourished in no acute distress.  Resting comfortably  at home.  Head is normocephalic, atraumatic.  No labored breathing.  Speech is clear and coherent with logical content.  Patient is alert and oriented at baseline.    Assessment and Plan:  Patrick Bell in today with chief complaint of Back Pain   1. Chronic midline low back pain without sciatica Rest Sedation precautions with robaxin- only take 1/2 tablet at first to  see how you do Moist heat Keep follow up appt with Dr. Warrick Parisian in 2 weeks    Follow Up Instructions: I discussed the assessment and treatment plan with the patient. The  patient was provided an opportunity to ask questions and all were answered. The patient agreed with the plan and demonstrated an understanding of the instructions.  A copy of instructions were sent to the patient via MyChart.  The patient was advised to call back or seek an in-person evaluation if the symptoms worsen or if the condition fails to improve as anticipated.  Time:  I spent 7 minutes with the patient via telehealth technology discussing the above problems/concerns.    Patrick Hassell Done, FNP

## 2022-06-29 DIAGNOSIS — M9903 Segmental and somatic dysfunction of lumbar region: Secondary | ICD-10-CM | POA: Diagnosis not present

## 2022-06-29 DIAGNOSIS — M9905 Segmental and somatic dysfunction of pelvic region: Secondary | ICD-10-CM | POA: Diagnosis not present

## 2022-06-29 DIAGNOSIS — M5137 Other intervertebral disc degeneration, lumbosacral region: Secondary | ICD-10-CM | POA: Diagnosis not present

## 2022-06-29 DIAGNOSIS — M9904 Segmental and somatic dysfunction of sacral region: Secondary | ICD-10-CM | POA: Diagnosis not present

## 2022-06-30 ENCOUNTER — Ambulatory Visit: Payer: Medicare Other | Admitting: *Deleted

## 2022-06-30 DIAGNOSIS — R293 Abnormal posture: Secondary | ICD-10-CM | POA: Diagnosis not present

## 2022-06-30 DIAGNOSIS — M9904 Segmental and somatic dysfunction of sacral region: Secondary | ICD-10-CM | POA: Diagnosis not present

## 2022-06-30 DIAGNOSIS — M5137 Other intervertebral disc degeneration, lumbosacral region: Secondary | ICD-10-CM | POA: Diagnosis not present

## 2022-06-30 DIAGNOSIS — M5459 Other low back pain: Secondary | ICD-10-CM | POA: Diagnosis not present

## 2022-06-30 DIAGNOSIS — M9903 Segmental and somatic dysfunction of lumbar region: Secondary | ICD-10-CM | POA: Diagnosis not present

## 2022-06-30 DIAGNOSIS — M62830 Muscle spasm of back: Secondary | ICD-10-CM

## 2022-06-30 DIAGNOSIS — M9905 Segmental and somatic dysfunction of pelvic region: Secondary | ICD-10-CM | POA: Diagnosis not present

## 2022-06-30 NOTE — Therapy (Signed)
OUTPATIENT PHYSICAL THERAPY THORACOLUMBAR Treatment   Patient Name: Patrick Bell MRN: [id number] DOB:1940-04-11, 82 y.o., male Today's Date: 06/30/2022  END OF SESSION:  PT End of Session - 06/30/22 1433     Visit Number 6    Number of Visits 12    Date for PT Re-Evaluation 06/20/22    Authorization Type FOTO AT LEAST EVERY 5TH VISIT.  PROGRESS NOTE AT 10TH VISIT.  KX MODIFIER AFTER 15 VISITS.    PT Start Time [id number]    PT Stop Time 1605    PT Time Calculation (min) 50 min              Past Medical History:  Diagnosis Date   Acute thoracic aortic dissection ([place]) 03/11/2020   Type A   Allergy    Asthma    Cataract    Cataract    Diverticulitis    Dyspnea    GERD (gastroesophageal reflux disease)    Heart murmur 12/2019   Hyperlipidemia    Hypertension    Inguinal hernia bilateral, non-recurrent    S/P aortic dissection repair 03/11/2020   supracoronary straight graft repair with resuspension of native aortic valve and open distal anastomosis for intraoperative acute type A aortic dissection    S/P mitral valve repair 03/11/2020   Complex valvuloplasty including artificial Gore-tex neochord placement x 4, suture plication of posterior leaflet and posteromedial commissure and 32 mm Sorin Memo 4D ring annuloplasty   Sleep apnea    Past Surgical History:  Procedure Laterality Date   bilateral inguinal hernia     CARDIAC CATHETERIZATION Bilateral 02/21/2020   CHEST TUBE INSERTION N/A 03/22/2020   Procedure: CHEST TUBE INSERTION OF 28 BLAKE DRAIN.;  Surgeon: Grace Isaac, MD;  Location: [facility];  Service: Thoracic;  Laterality: N/A;   COLONOSCOPY  06/09/2004   [id number] colon diverticulosis, otherwise normal colonoscopy   EYE SURGERY  08/2014   HERNIA REPAIR     MITRAL VALVE REPAIR N/A 03/11/2020   Procedure: MITRAL VALVE REPAIR (MVR) USING MEMO 4D SIZE 32 RING;  Surgeon: Rexene Alberts, MD;  Location: [facility];  Service: Open Heart Surgery;  Laterality: N/A;    PALATE / UVULA BIOPSY / EXCISION     PERICARDIAL WINDOW N/A 03/22/2020   Procedure: SUBXYPHOID POST-OP PERICARDIAL FLUID DRAINAGE WITH CHEST TUBE INSERTION.;  Surgeon: Grace Isaac, MD;  Location: [facility];  Service: Thoracic;  Laterality: N/A;   REPAIR OF ACUTE ASCENDING THORACIC AORTIC DISSECTION N/A 03/11/2020   Procedure: REPAIR OF ACUTE ASCENDING THORACIC AORTIC DISSECTION USING A HEMASHIELD PLATINUM 26MM STRAIGHT GRAFT AND A HEASHIELD PLATINUM 28X10MM SINGLE ARM GRAFT;  Surgeon: Rexene Alberts, MD;  Location: [facility];  Service: Open Heart Surgery;  Laterality: N/A;   RIGHT/LEFT HEART CATH AND CORONARY ANGIOGRAPHY N/A 02/21/2020   Procedure: RIGHT/LEFT HEART CATH AND CORONARY ANGIOGRAPHY;  Surgeon: Troy Sine, MD;  Location: [facility];  Service: Cardiovascular;  Laterality: N/A;   TEE WITHOUT CARDIOVERSION N/A 12/19/2019   Procedure: TRANSESOPHAGEAL ECHOCARDIOGRAM (TEE);  Surgeon: Buford Dresser, MD;  Location: [facility] ENDOSCOPY;  Service: Cardiovascular;  Laterality: N/A;   TEE WITHOUT CARDIOVERSION N/A 03/11/2020   Procedure: TRANSESOPHAGEAL ECHOCARDIOGRAM (TEE);  Surgeon: Rexene Alberts, MD;  Location: [facility];  Service: Open Heart Surgery;  Laterality: N/A;   TEE WITHOUT CARDIOVERSION N/A 03/22/2020   Procedure: TRANSESOPHAGEAL ECHOCARDIOGRAM (TEE);  Surgeon: Grace Isaac, MD;  Location: [facility];  Service: Thoracic;  Laterality: N/A;   TONSILLECTOMY  VASECTOMY     Patient Active Problem List   Diagnosis Date Noted   Allergic rhinitis 11/08/2021   Presbycusis of both ears 10/21/2021   Long term (current) use of anticoagulants 04/27/2020   Atrial fibrillation ([place]) 04/27/2020   Anemia 04/21/2020   Peripheral edema    Hypothyroidism    S/P mitral valve repair 03/11/2020   S/P aortic dissection repair 03/11/2020   BPH (benign prostatic hyperplasia) 07/29/2014   Low serum vitamin D 03/14/2014   Hyperlipidemia    GERD (gastroesophageal reflux disease)  08/03/2012   Essential hypertension, benign 08/19/2008   DIVERTICULAR DISEASE 08/18/2008   BACK PAIN, CHRONIC 08/18/2008   OSA (obstructive sleep apnea) 08/18/2008   PREMATURE VENTRICULAR CONTRACTIONS 07/23/2008   Asthma-COPD overlap syndrome 07/23/2008     REFERRING PROVIDER: Melina Schools MD  REFERRING DIAG: Vertebrogenic low back pain  Rationale for Evaluation and Treatment: Rehabilitation  THERAPY DIAG:  Other low back pain  Abnormal posture  Muscle spasm of back  ONSET DATE: Since he was in his early 20's.  SUBJECTIVE:                                                                                                                                                                                           SUBJECTIVE STATEMENT: Did good  after last Rx, but have had a lot of pain the last few days after riding to [place] several times. Went to the [employer] couple times. Today 7/10. Last night 10/10. Want to be put on hold x 2 weeks   PERTINENT HISTORY:  HTN, Mitral valve repair.  PAIN:  Are you having pain? 2-3/10.  Lower lumbar.  "Spasm."  PRECAUTIONS: None  WEIGHT BEARING RESTRICTIONS: No  FALLS:  Has patient fallen in last 6 months? No  LIVING ENVIRONMENT: Lives with: lives with their spouse Lives in: House/apartment Has following equipment at home: None  OCCUPATION: Retired Administrator.  PLOF: Independent with basic ADLs  PATIENT GOALS: Regain strength/stamina while walking.    OBJECTIVE:   PATIENT SURVEYS:  FOTO 43.  MUSCLE LENGTH: Hamstrings: Tightness in hamstrings bilaterally.  POSTURE: rounded shoulders, forward head, decreased lumbar lordosis, and posterior pelvic tilt.  Scoliosis.   PALPATION: Diffuse tenderness over bilateral lower lumbar musculature and a very tender trigger point in his right low back musculature.    LUMBAR ROM:  Active lumbar flexion limited by 2/3's of full range and extension to ~5 degrees.   LOWER  EXTREMITY MMT:    Normal bilateral LE strength.  LUMBAR SPECIAL TESTS:  Equal leg lengths.  Bilateral SLR testing remarkable for hamstring stretching. GAIT: Flexed trunk posture.  TODAY'S  TREATMENT:    06/30/22        Patient in LT side lying position with  pillow  under his stomach for comfort:  Combo e'stim/US at 1.50 W/CM2 x 12 minutes  RT Uper glut/ piriformis  STW/M x 12 minutes to patient's RT piriformis musculature and glut with ischemic release technique prone  HMP and IFC at 80-150 Hz on 40% scan x 20 minutes in supine with wedge under knees for comfort.  Patient tolerated treatment without complaint with normal modality response following removal of modality.                                                                                                                        PATIENT EDUCATION:  Education details: We  discussed correct posture which includes while sleeping (he sleeps on his side) with pillows between knees for correct hip alignment. Person educated: Patient Education method: Explanation Education comprehension: verbalized understanding  HOME EXERCISE PROGRAM: HOME EXERCISE PROGRAM Created by Mali Applegate Feb 19th, 2024 View at [email or web address] using code: [place]  Page 1 of 1 1 Exercise Standing lumbar extension Place elbows on wall. Allow hips to fall towards wall. Repeat 10 Times Hold 2 Seconds Complete 1 Set Perform 3 Times a Day  ASSESSMENT:  CLINICAL IMPRESSION: Patient  arrived today not doing well and reports having a flare up after 3 trips to [place]. He reports having to go to the chiropractor 2 times and is doing some better. Rx focused on decreasing RT sided pain glut/piriformis with [place] combo, STW, as well as estim. LTG#1 met, but others are ongoing  Due to pain and Pt requested to be put on hold x 2 weeks  OBJECTIVE IMPAIRMENTS: decreased activity tolerance, decreased mobility, decreased ROM, increased muscle spasms,  postural dysfunction, and pain.   ACTIVITY LIMITATIONS: carrying, lifting, bending, and transfers  PARTICIPATION LIMITATIONS: meal prep, cleaning, laundry, and yard work  PERSONAL FACTORS: Time since onset of injury/illness/exacerbation are also affecting patient's functional outcome.   REHAB POTENTIAL: Good  CLINICAL DECISION MAKING: Stable/uncomplicated  EVALUATION COMPLEXITY: Low   GOALS:  LONG TERM GOALS: Target date: 06/20/22.  Ind with a HEP.  Goal status: MET  2.  Sit to stand with pain not > 3/10 and no radiation of pain into bilateral groin regions.  Goal status: On going  3.  Perform ADL's with pain not > 3/10.  Goal status: On going   PLAN:  PT FREQUENCY: 3x/week  PT DURATION: 4 weeks  PLANNED INTERVENTIONS: Therapeutic exercises, Therapeutic activity, Patient/Family education, Self Care, Dry Needling, Electrical stimulation, Cryotherapy, Moist heat, Traction, Ultrasound, and Manual therapy.  PLAN FOR NEXT SESSION: On hold x 2 weeks   Jame Seelig,CHRIS, PTA 06/30/2022, 5:38 PM

## 2022-07-04 DIAGNOSIS — M9903 Segmental and somatic dysfunction of lumbar region: Secondary | ICD-10-CM | POA: Diagnosis not present

## 2022-07-04 DIAGNOSIS — M9905 Segmental and somatic dysfunction of pelvic region: Secondary | ICD-10-CM | POA: Diagnosis not present

## 2022-07-04 DIAGNOSIS — M5137 Other intervertebral disc degeneration, lumbosacral region: Secondary | ICD-10-CM | POA: Diagnosis not present

## 2022-07-04 DIAGNOSIS — M9904 Segmental and somatic dysfunction of sacral region: Secondary | ICD-10-CM | POA: Diagnosis not present

## 2022-07-07 ENCOUNTER — Ambulatory Visit (INDEPENDENT_AMBULATORY_CARE_PROVIDER_SITE_OTHER): Payer: Medicare Other

## 2022-07-07 ENCOUNTER — Ambulatory Visit (INDEPENDENT_AMBULATORY_CARE_PROVIDER_SITE_OTHER): Payer: Medicare Other | Admitting: Family Medicine

## 2022-07-07 ENCOUNTER — Encounter: Payer: Self-pay | Admitting: Family Medicine

## 2022-07-07 ENCOUNTER — Other Ambulatory Visit: Payer: Self-pay | Admitting: Family Medicine

## 2022-07-07 VITALS — BP 132/69 | HR 76 | Ht 70.0 in | Wt 186.0 lb

## 2022-07-07 DIAGNOSIS — E039 Hypothyroidism, unspecified: Secondary | ICD-10-CM | POA: Diagnosis not present

## 2022-07-07 DIAGNOSIS — M25552 Pain in left hip: Secondary | ICD-10-CM

## 2022-07-07 DIAGNOSIS — M160 Bilateral primary osteoarthritis of hip: Secondary | ICD-10-CM | POA: Diagnosis not present

## 2022-07-07 DIAGNOSIS — M25551 Pain in right hip: Secondary | ICD-10-CM

## 2022-07-07 DIAGNOSIS — I10 Essential (primary) hypertension: Secondary | ICD-10-CM

## 2022-07-07 DIAGNOSIS — M9903 Segmental and somatic dysfunction of lumbar region: Secondary | ICD-10-CM | POA: Diagnosis not present

## 2022-07-07 DIAGNOSIS — M5137 Other intervertebral disc degeneration, lumbosacral region: Secondary | ICD-10-CM | POA: Diagnosis not present

## 2022-07-07 DIAGNOSIS — E7800 Pure hypercholesterolemia, unspecified: Secondary | ICD-10-CM

## 2022-07-07 DIAGNOSIS — K219 Gastro-esophageal reflux disease without esophagitis: Secondary | ICD-10-CM

## 2022-07-07 DIAGNOSIS — M9904 Segmental and somatic dysfunction of sacral region: Secondary | ICD-10-CM | POA: Diagnosis not present

## 2022-07-07 DIAGNOSIS — M9905 Segmental and somatic dysfunction of pelvic region: Secondary | ICD-10-CM | POA: Diagnosis not present

## 2022-07-07 DIAGNOSIS — G4733 Obstructive sleep apnea (adult) (pediatric): Secondary | ICD-10-CM | POA: Diagnosis not present

## 2022-07-07 MED ORDER — METHYLPREDNISOLONE ACETATE 80 MG/ML IJ SUSP
80.0000 mg | Freq: Once | INTRAMUSCULAR | Status: AC
Start: 2022-07-07 — End: 2022-07-07
  Administered 2022-07-07: 80 mg via INTRA_ARTICULAR

## 2022-07-07 MED ORDER — TRAMADOL HCL 50 MG PO TABS
50.0000 mg | ORAL_TABLET | Freq: Two times a day (BID) | ORAL | 0 refills | Status: DC | PRN
Start: 2022-07-07 — End: 2022-07-26

## 2022-07-07 MED ORDER — LEVOTHYROXINE SODIUM 50 MCG PO TABS: 50.0000 ug | ORAL_TABLET | Freq: Every day | ORAL | 3 refills | Status: DC

## 2022-07-07 MED ORDER — PANTOPRAZOLE SODIUM 40 MG PO TBEC: DELAYED_RELEASE_TABLET | ORAL | 3 refills | Status: DC

## 2022-07-07 MED ORDER — MONTELUKAST SODIUM 10 MG PO TABS: 10.0000 mg | ORAL_TABLET | Freq: Every day | ORAL | 3 refills | Status: DC

## 2022-07-07 NOTE — Progress Notes (Signed)
BP 132/69   Pulse 76   Ht 5\' 10"  (1.778 m)   Wt 186 lb (84.4 kg)   SpO2 95%   BMI 26.69 kg/m    Subjective:   Patient ID: Patrick Bell, male    DOB: 1940-12-29, 82 y.o.   MRN: [id number]  HPI: Patrick Bell is a 82 y.o. male presenting on 07/07/2022 for Medical Management of Chronic Issues, Hyperlipidemia, Hypertension, Hypothyroidism, and Pelvic Pain (Cramping. Has pain in buttocks down left leg)   HPI Hypertension Patient is currently on amlodipine and metoprolol, and their blood pressure today is 132/69. Patient denies any lightheadedness or dizziness. Patient denies headaches, blurred vision, chest pains, shortness of breath, or weakness. Denies any side effects from medication and is content with current medication.   Hyperlipidemia Patient is coming in for recheck of his hyperlipidemia. The patient is currently taking rosuvastatin. They deny any issues with myalgias or history of liver damage from it. They deny any focal numbness or weakness or chest pain.   Hypothyroidism recheck Patient is coming in for thyroid recheck today as well. They deny any issues with hair changes or heat or cold problems or diarrhea or constipation. They deny any chest pain or palpitations. They are currently on levothyroxine 50 micrograms   GERD Patient is currently on metoprolol.  She denies any major symptoms or abdominal pain or belching or burping. She denies any blood in her stool or lightheadedness or dizziness.   Patient has been struggling with lower back pain for some time but he does have some right-sided sciatica going on right now on the back of his right buttocks but he also complains now of pain in both groins on both sides and that it has been crampy and achy type pain.  He has been taking a lot of Tylenol and then has used some old prescriptions for hydrocodone and tramadol recently.  He says this pain is new and has been worse and been bothering him a lot and affecting his  walking.  Relevant past medical, surgical, family and social history reviewed and updated as indicated. Interim medical history since our last visit reviewed. Allergies and medications reviewed and updated.  Review of Systems  Constitutional:  Negative for chills and fever.  Eyes:  Negative for visual disturbance.  Respiratory:  Negative for shortness of breath and wheezing.   Cardiovascular:  Negative for chest pain and leg swelling.  Musculoskeletal:  Positive for arthralgias, back pain and myalgias. Negative for gait problem.  Skin:  Negative for rash.  All other systems reviewed and are negative.   Per HPI unless specifically indicated above   Allergies as of 07/07/2022   No Known Allergies      Medication List        Accurate as of July 07, 2022 10:18 AM. If you have any questions, ask your nurse or doctor.          acetaminophen 325 MG tablet Commonly known as: TYLENOL Take 1-2 tablets (325-650 mg total) by mouth every 4 (four) hours as needed for mild pain. What changed: additional instructions   albuterol 108 (90 Base) MCG/ACT inhaler Commonly known as: VENTOLIN HFA INHALE 2 PUFFS EVERY 6 HOURS AS NEEDED FOR WHEEZING OR SHORTNESS OF BREATH   amLODipine 10 MG tablet Commonly known as: NORVASC Take 1 tablet (10 mg total) by mouth daily.   guaiFENesin 600 MG 12 hr tablet Commonly known as: MUCINEX Take 600 mg by mouth every 4 (four) hours  as needed for cough or to loosen phlegm.   ipratropium 0.03 % nasal spray Commonly known as: ATROVENT   levothyroxine 50 MCG tablet Commonly known as: SYNTHROID Take 1 tablet (50 mcg total) by mouth daily.   methocarbamol 500 MG tablet Commonly known as: ROBAXIN Take 1 tablet (500 mg total) by mouth 4 (four) times daily.   metoprolol succinate 50 MG 24 hr tablet Commonly known as: TOPROL-XL TAKE ONE TABLET AT BEDTIME   montelukast 10 MG tablet Commonly known as: SINGULAIR Take 1 tablet (10 mg total) by mouth  daily.   Olopatadine HCl 0.2 % Soln Place 1 drop into both eyes daily as needed (allergies).   pantoprazole 40 MG tablet Commonly known as: PROTONIX TAKE 1 TABLET DAILY   rosuvastatin 20 MG tablet Commonly known as: CRESTOR Take 1 tablet (20 mg total) by mouth daily.   traMADol 50 MG tablet Commonly known as: ULTRAM Take 1 tablet (50 mg total) by mouth every 12 (twelve) hours as needed. Started by: Worthy Rancher, MD   warfarin 5 MG tablet Commonly known as: COUMADIN Take as directed by the anticoagulation clinic. If you are unsure how to take this medication, talk to your nurse or doctor. Original instructions: TAKE ONE TABLET ONCE DAILY AT 4PM         Objective:   BP 132/69   Pulse 76   Ht 5\' 10"  (1.778 m)   Wt 186 lb (84.4 kg)   SpO2 95%   BMI 26.69 kg/m   Wt Readings from Last 3 Encounters:  07/07/22 186 lb (84.4 kg)  05/11/22 189 lb (85.7 kg)  03/09/22 183 lb 6.4 oz (83.2 kg)    Physical Exam Vitals and nursing note reviewed.  Constitutional:      General: He is not in acute distress.    Appearance: He is well-developed. He is not diaphoretic.  Eyes:     General: No scleral icterus.    Conjunctiva/sclera: Conjunctivae normal.  Neck:     Thyroid: No thyromegaly.  Cardiovascular:     Rate and Rhythm: Normal rate and regular rhythm.     Heart sounds: Normal heart sounds. No murmur heard. Pulmonary:     Effort: Pulmonary effort is normal. No respiratory distress.     Breath sounds: Normal breath sounds. No wheezing.  Musculoskeletal:        General: Normal range of motion.     Cervical back: Neck supple.     Lumbar back: Tenderness present. No deformity or bony tenderness. Negative right straight leg raise test and negative left straight leg raise test.     Right hip: Tenderness present. No deformity, bony tenderness or crepitus. Normal range of motion.     Left hip: Tenderness present. No deformity, bony tenderness or crepitus. Normal range of  motion.  Lymphadenopathy:     Cervical: No cervical adenopathy.  Skin:    General: Skin is warm and dry.     Findings: No rash.  Neurological:     Mental Status: He is alert and oriented to person, place, and time.     Coordination: Coordination normal.  Psychiatric:        Behavior: Behavior normal.     Bilateral hip x-ray: Degenerative osteoarthritis moderate in bilateral hips, await final read from radiology.  Assessment & Plan:   Problem List Items Addressed This Visit       Cardiovascular and Mediastinum   Essential hypertension, benign - Primary   Relevant Orders   CMP14+EGFR  Digestive   GERD (gastroesophageal reflux disease)   Relevant Medications   pantoprazole (PROTONIX) 40 MG tablet   Other Relevant Orders   CBC with Differential/Platelet     Endocrine   Hypothyroidism   Relevant Medications   levothyroxine (SYNTHROID) 50 MCG tablet   Other Relevant Orders   TSH     Other   Hyperlipidemia   Relevant Orders   Lipid panel   Other Visit Diagnoses     Bilateral hip pain       Relevant Medications   methylPREDNISolone acetate (DEPO-MEDROL) injection 80 mg (Start on 07/07/2022 10:30 AM)   traMADol (ULTRAM) 50 MG tablet       Continue with current medicine,  Will give Depo 80 intramuscular and see if we can calm down his arthritis in his hips and help with his back a little bit, recommended that he continue doing Tylenol for the most part and then he can use the tramadol but he has a little bit left for now. Follow up plan: Return in about 6 months (around 01/06/2023), or if symptoms worsen or fail to improve, for Thyroid and hypertension and hyperlipidemia.  Counseling provided for all of the vaccine components Orders Placed This Encounter  Procedures   CBC with Differential/Platelet   CMP14+EGFR   Lipid panel   TSH    Caryl Pina, MD [facility] 07/07/2022, 10:18 AM

## 2022-07-08 LAB — CMP14+EGFR
ALT: 18 [IU]/L (ref 0–44)
AST: 28 [IU]/L (ref 0–40)
Albumin/Globulin Ratio: 2.1 (ref 1.2–2.2)
Albumin: 4.4 g/dL (ref 3.7–4.7)
Alkaline Phosphatase: 66 [IU]/L (ref 44–121)
BUN/Creatinine Ratio: 13 (ref 10–24)
BUN: 14 mg/dL (ref 8–27)
Bilirubin Total: 0.7 mg/dL (ref 0.0–1.2)
CO2: 22 mmol/L (ref 20–29)
Calcium: 9.3 mg/dL (ref 8.6–10.2)
Chloride: 105 mmol/L (ref 96–106)
Creatinine, Ser: 1.09 mg/dL (ref 0.76–1.27)
Globulin, Total: 2.1 g/dL (ref 1.5–4.5)
Glucose: 96 mg/dL (ref 70–99)
Potassium: 4 mmol/L (ref 3.5–5.2)
Sodium: 141 mmol/L (ref 134–144)
Total Protein: 6.5 g/dL (ref 6.0–8.5)
eGFR: 68 mL/min/{1.73_m2}

## 2022-07-08 LAB — CBC WITH DIFFERENTIAL/PLATELET
Basophils Absolute: 0 10*3/uL (ref 0.0–0.2)
Basos: 1 %
EOS (ABSOLUTE): 0.1 10*3/uL (ref 0.0–0.4)
Eos: 2 %
Hematocrit: 46.5 % (ref 37.5–51.0)
Hemoglobin: 15.1 g/dL (ref 13.0–17.7)
Immature Grans (Abs): 0 10*3/uL (ref 0.0–0.1)
Immature Granulocytes: 0 %
Lymphocytes Absolute: 0.8 10*3/uL (ref 0.7–3.1)
Lymphs: 16 %
MCH: 29 pg (ref 26.6–33.0)
MCHC: 32.5 g/dL (ref 31.5–35.7)
MCV: 89 fL (ref 79–97)
Monocytes Absolute: 0.5 10*3/uL (ref 0.1–0.9)
Monocytes: 10 %
Neutrophils Absolute: 3.5 10*3/uL (ref 1.4–7.0)
Neutrophils: 71 %
Platelets: 193 10*3/uL (ref 150–450)
RBC: 5.2 x10E6/uL (ref 4.14–5.80)
RDW: 13.3 % (ref 11.6–15.4)
WBC: 5 10*3/uL (ref 3.4–10.8)

## 2022-07-08 LAB — LIPID PANEL
Chol/HDL Ratio: 2.3 ratio (ref 0.0–5.0)
Cholesterol, Total: 157 mg/dL (ref 100–199)
HDL: 68 mg/dL
LDL Chol Calc (NIH): 70 mg/dL (ref 0–99)
Triglycerides: 107 mg/dL (ref 0–149)
VLDL Cholesterol Cal: 19 mg/dL (ref 5–40)

## 2022-07-08 LAB — TSH: TSH: 1.11 u[IU]/mL (ref 0.450–4.500)

## 2022-07-11 ENCOUNTER — Ambulatory Visit: Payer: Medicare Other | Attending: Internal Medicine | Admitting: *Deleted

## 2022-07-11 DIAGNOSIS — M9903 Segmental and somatic dysfunction of lumbar region: Secondary | ICD-10-CM | POA: Diagnosis not present

## 2022-07-11 DIAGNOSIS — I71019 Dissection of thoracic aorta, unspecified: Secondary | ICD-10-CM

## 2022-07-11 DIAGNOSIS — M9904 Segmental and somatic dysfunction of sacral region: Secondary | ICD-10-CM | POA: Diagnosis not present

## 2022-07-11 DIAGNOSIS — M5137 Other intervertebral disc degeneration, lumbosacral region: Secondary | ICD-10-CM | POA: Diagnosis not present

## 2022-07-11 DIAGNOSIS — M9905 Segmental and somatic dysfunction of pelvic region: Secondary | ICD-10-CM | POA: Diagnosis not present

## 2022-07-11 DIAGNOSIS — Z9889 Other specified postprocedural states: Secondary | ICD-10-CM | POA: Diagnosis not present

## 2022-07-11 LAB — POCT INR: POC INR: 4.4

## 2022-07-11 NOTE — Patient Instructions (Addendum)
Description   Hold warfarin until procedure on 07/18/2022. That evening resume normal dose of warfarin 1 tablet daily except 1/2 tablet each Sundays, Wednesdays, and Fridays. Repeat INR in 1 week post procedure.  Anticoagulation Clinic [phone]

## 2022-07-14 DIAGNOSIS — M5137 Other intervertebral disc degeneration, lumbosacral region: Secondary | ICD-10-CM | POA: Diagnosis not present

## 2022-07-14 DIAGNOSIS — M9905 Segmental and somatic dysfunction of pelvic region: Secondary | ICD-10-CM | POA: Diagnosis not present

## 2022-07-14 DIAGNOSIS — M9903 Segmental and somatic dysfunction of lumbar region: Secondary | ICD-10-CM | POA: Diagnosis not present

## 2022-07-14 DIAGNOSIS — M9904 Segmental and somatic dysfunction of sacral region: Secondary | ICD-10-CM | POA: Diagnosis not present

## 2022-07-18 DIAGNOSIS — L84 Corns and callosities: Secondary | ICD-10-CM | POA: Diagnosis not present

## 2022-07-20 ENCOUNTER — Ambulatory Visit (INDEPENDENT_AMBULATORY_CARE_PROVIDER_SITE_OTHER): Payer: Medicare Other | Admitting: Family Medicine

## 2022-07-20 ENCOUNTER — Encounter: Payer: Self-pay | Admitting: Family Medicine

## 2022-07-20 VITALS — BP 124/67 | HR 70 | Ht 70.0 in | Wt 178.0 lb

## 2022-07-20 DIAGNOSIS — M160 Bilateral primary osteoarthritis of hip: Secondary | ICD-10-CM

## 2022-07-20 DIAGNOSIS — M5136 Other intervertebral disc degeneration, lumbar region: Secondary | ICD-10-CM | POA: Diagnosis not present

## 2022-07-20 NOTE — Progress Notes (Signed)
BP 124/67   Pulse 70   Ht  (1.778 m)   Wt 178 lb (80.7 kg)   SpO2 98%   BMI 25.54 kg/m    Subjective:   Patient ID: Posey Pronto, male    DOB: [birth year], 82 y.o.   MRN: [id number]  HPI: GILBERTO STANFORTH is a 82 y.o. male presenting on 07/20/2022 for Discuss Xray results (Recent injection and tramadol. Pt has improved. Taking only Tylenol now.) and Dizziness (Feels off balance. ? If BP too low)   HPI Patient is coming in to discuss tramadol and recent injections and Tylenol. He says that the Tylenol is helping.  The tramadol made him too sleepy and the injection helped as well and he wonders how often he can get injections like that.  He says it hurts in bilateral hips through his groin and his lower back.  He says the hips hurt the most but he is able to manage with Tylenol at this point.  Relevant past medical, surgical, family and social history reviewed and updated as indicated. Interim medical history since our last visit reviewed. Allergies and medications reviewed and updated.  Review of Systems  Constitutional:  Negative for chills and fever.  Eyes:  Negative for visual disturbance.  Respiratory:  Negative for shortness of breath and wheezing.   Cardiovascular:  Negative for chest pain and leg swelling.  Musculoskeletal:  Positive for arthralgias and gait problem. Negative for back pain.  Skin:  Negative for rash.  Neurological:  Negative for dizziness and light-headedness.  All other systems reviewed and are negative.   Per HPI unless specifically indicated above   Allergies as of 07/20/2022   No Known Allergies      Medication List        Accurate as of July 20, 2022  9:26 AM. If you have any questions, ask your nurse or doctor.          acetaminophen 325 MG tablet Commonly known as: TYLENOL Take 1-2 tablets (325-650 mg total) by mouth every 4 (four) hours as needed for mild pain. What changed: additional instructions   albuterol 108 (90  Base) MCG/ACT inhaler Commonly known as: VENTOLIN HFA INHALE 2 PUFFS EVERY 6 HOURS AS NEEDED FOR WHEEZING OR SHORTNESS OF BREATH   amLODipine 10 MG tablet Commonly known as: NORVASC Take 1 tablet (10 mg total) by mouth daily.   guaiFENesin 600 MG 12 hr tablet Commonly known as: MUCINEX Take 600 mg by mouth every 4 (four) hours as needed for cough or to loosen phlegm.   ipratropium 0.03 % nasal spray Commonly known as: ATROVENT   levothyroxine 50 MCG tablet Commonly known as: SYNTHROID Take 1 tablet (50 mcg total) by mouth daily.   methocarbamol 500 MG tablet Commonly known as: ROBAXIN Take 1 tablet (500 mg total) by mouth 4 (four) times daily.   metoprolol succinate 50 MG 24 hr tablet Commonly known as: TOPROL-XL TAKE ONE TABLET AT BEDTIME   montelukast 10 MG tablet Commonly known as: SINGULAIR Take 1 tablet (10 mg total) by mouth daily.   Olopatadine HCl 0.2 % Soln Place 1 drop into both eyes daily as needed (allergies).   pantoprazole 40 MG tablet Commonly known as: PROTONIX TAKE 1 TABLET DAILY   rosuvastatin 20 MG tablet Commonly known as: CRESTOR Take 1 tablet (20 mg total) by mouth daily.   traMADol 50 MG tablet Commonly known as: ULTRAM Take 1 tablet (50 mg total) by mouth every 12 (twelve)  hours as needed.   warfarin 5 MG tablet Commonly known as: COUMADIN Take as directed by the anticoagulation clinic. If you are unsure how to take this medication, talk to your nurse or doctor. Original instructions: TAKE ONE TABLET ONCE DAILY AT 4PM         Objective:   BP 124/67   Pulse 70   Ht  (1.778 m)   Wt 178 lb (80.7 kg)   SpO2 98%   BMI 25.54 kg/m   Wt Readings from Last 3 Encounters:  07/20/22 178 lb (80.7 kg)  07/07/22 186 lb (84.4 kg)  05/11/22 189 lb (85.7 kg)    Physical Exam Vitals and nursing note reviewed.  Constitutional:      General: He is not in acute distress.    Appearance: He is well-developed. He is not diaphoretic.   Eyes:     General: No scleral icterus.    Conjunctiva/sclera: Conjunctivae normal.  Skin:    General: Skin is warm and dry.     Findings: No rash.  Neurological:     Mental Status: He is alert and oriented to person, place, and time.     Coordination: Coordination normal.  Psychiatric:        Behavior: Behavior normal.       Assessment & Plan:   Problem List Items Addressed This Visit   None Visit Diagnoses     Primary osteoarthritis of both hips    -  Primary   Degenerative disc disease, lumbar           Patient positive, discussed x-ray results.  He does have degenerative disc disease Instructed patient to remain active but to try and mix in some low impact exercises. Follow up plan: Return if symptoms worsen or fail to improve, for Due for normal follow-up in October.  Counseling provided for all of the vaccine components No orders of the defined types were placed in this encounter.   Arville Care, MD [facility] 07/20/2022, 9:26 AM

## 2022-07-21 DIAGNOSIS — M9903 Segmental and somatic dysfunction of lumbar region: Secondary | ICD-10-CM | POA: Diagnosis not present

## 2022-07-21 DIAGNOSIS — M9905 Segmental and somatic dysfunction of pelvic region: Secondary | ICD-10-CM | POA: Diagnosis not present

## 2022-07-21 DIAGNOSIS — M9904 Segmental and somatic dysfunction of sacral region: Secondary | ICD-10-CM | POA: Diagnosis not present

## 2022-07-21 DIAGNOSIS — M5137 Other intervertebral disc degeneration, lumbosacral region: Secondary | ICD-10-CM | POA: Diagnosis not present

## 2022-07-25 ENCOUNTER — Ambulatory Visit: Payer: Medicare Other

## 2022-07-25 ENCOUNTER — Telehealth: Payer: Self-pay | Admitting: Cardiovascular Disease

## 2022-07-25 NOTE — Progress Notes (Unsigned)
Cardiology Clinic Note   Patient Name: Patrick Bell Date of Encounter: 07/26/2022  Primary Care Provider:  Dettinger, Elige Radon, MD Primary Cardiologist:  Nicki Guadalajara, MD  Patient Profile    Patrick Bell 82 year old male presents to the clinic today for follow-up evaluation of his essential hypertension and atrial fibrillation.  Past Medical History    Past Medical History:  Diagnosis Date   Acute thoracic aortic dissection 03/11/2020   Type A   Allergy    Asthma    Cataract    Cataract    Diverticulitis    Dyspnea    GERD (gastroesophageal reflux disease)    Heart murmur 12/2019   Hyperlipidemia    Hypertension    Inguinal hernia bilateral, non-recurrent    S/P aortic dissection repair 03/11/2020   supracoronary straight graft repair with resuspension of native aortic valve and open distal anastomosis for intraoperative acute type A aortic dissection    S/P mitral valve repair 03/11/2020   Complex valvuloplasty including artificial Gore-tex neochord placement x 4, suture plication of posterior leaflet and posteromedial commissure and 32 mm Sorin Memo 4D ring annuloplasty   Sleep apnea    Past Surgical History:  Procedure Laterality Date   bilateral inguinal hernia     CARDIAC CATHETERIZATION Bilateral 02/21/2020   CHEST TUBE INSERTION N/A 03/22/2020   Procedure: CHEST TUBE INSERTION OF 28 BLAKE DRAIN.;  Surgeon: Delight Ovens, MD;  Location: [facility] OR;  Service: Thoracic;  Laterality: N/A;   COLONOSCOPY  06/09/2004   [id number] colon diverticulosis, otherwise normal colonoscopy   EYE SURGERY  08/2014   HERNIA REPAIR     MITRAL VALVE REPAIR N/A 03/11/2020   Procedure: MITRAL VALVE REPAIR (MVR) USING MEMO 4D SIZE 32 RING;  Surgeon: Purcell Nails, MD;  Location: [facility] OR;  Service: Open Heart Surgery;  Laterality: N/A;   PALATE / UVULA BIOPSY / EXCISION     PERICARDIAL WINDOW N/A 03/22/2020   Procedure: SUBXYPHOID POST-OP PERICARDIAL FLUID DRAINAGE WITH CHEST  TUBE INSERTION.;  Surgeon: Delight Ovens, MD;  Location: [facility] OR;  Service: Thoracic;  Laterality: N/A;   REPAIR OF ACUTE ASCENDING THORACIC AORTIC DISSECTION N/A 03/11/2020   Procedure: REPAIR OF ACUTE ASCENDING THORACIC AORTIC DISSECTION USING A HEMASHIELD PLATINUM STRAIGHT GRAFT AND A HEASHIELD PLATINUM 28X10MM SINGLE ARM GRAFT;  Surgeon: Purcell Nails, MD;  Location: [facility] OR;  Service: Open Heart Surgery;  Laterality: N/A;   RIGHT/LEFT HEART CATH AND CORONARY ANGIOGRAPHY N/A 02/21/2020   Procedure: RIGHT/LEFT HEART CATH AND CORONARY ANGIOGRAPHY;  Surgeon: Lennette Bihari, MD;  Location: [facility] INVASIVE CV LAB;  Service: Cardiovascular;  Laterality: N/A;   TEE WITHOUT CARDIOVERSION N/A 12/19/2019   Procedure: TRANSESOPHAGEAL ECHOCARDIOGRAM (TEE);  Surgeon: Jodelle Red, MD;  Location: [facility] ENDOSCOPY;  Service: Cardiovascular;  Laterality: N/A;   TEE WITHOUT CARDIOVERSION N/A 03/11/2020   Procedure: TRANSESOPHAGEAL ECHOCARDIOGRAM (TEE);  Surgeon: Purcell Nails, MD;  Location: [facility] OR;  Service: Open Heart Surgery;  Laterality: N/A;   TEE WITHOUT CARDIOVERSION N/A 03/22/2020   Procedure: TRANSESOPHAGEAL ECHOCARDIOGRAM (TEE);  Surgeon: Delight Ovens, MD;  Location: [facility] OR;  Service: Thoracic;  Laterality: N/A;   TONSILLECTOMY     VASECTOMY      Allergies  No Known Allergies  History of Present Illness    Patrick Bell has a PMH of HTN, HLD, asthma, OSA, mitral valve regurgitation status post mitral valve repair by Dr. Cornelius Moras 03/11/2020.  His procedure was complicated by type  a aortic dissection requiring graft repair of his aorta and resuspension of his aortic valve.  He followed up with Corine Shelter, PA-C via telemedicine 1/22.  At that time his blood pressure was well-controlled.  His creatinine was stable as well as his anemia with a hemoglobin of 8.8.  He was seen in follow-up last by Dr. Tresa Endo on 03/09/2022.  He denied exertional chest pain.  He did note 2 episodes of  short-lived mild chest sensation while sitting in a chair.  Episodes were nonexertional.  He admitted to mild dizziness.  His blood pressure remained stable.  He underwent follow-up sleep study by Dr. York Spaniel and CPAP pressure of 11 cm was recommended.  He contacted the nurse triage line on 07/25/2022.  He reported that over the previous month he had noted increased shortness of breath with moving around and he denied lower extremity swelling as well as weight gain.  He reported losing around 10 pounds over the course of a month.  His blood pressure was 127/70 with a heart rate of 76.  He presents to the clinic today for follow-up evaluation and states over the last several weeks he has noticed mild left chest discomfort, dizziness, a.m. nosebleeds, occasional dizziness and 10 pound weight loss.  He has have poor appetite.  He notices that his symptoms started after attending a funeral.  He is also not been as active due to back pain.  We reviewed his echocardiogram 04/15/2021 and aortic dissection.  I will order CBC, BMP, echocardiogram for further evaluation and plan follow-up in 2 to 3 months.  Today he denies chest pain, shortness of breath, lower extremity edema, palpitations, melena, hematuria, hemoptysis, diaphoresis, weakness, presyncope, syncope, orthopnea, and PND.    Home Medications    Prior to Admission medications   Medication Sig Start Date End Date Taking? Authorizing Provider  acetaminophen (TYLENOL) 325 MG tablet Take 1-2 tablets (325-650 mg total) by mouth every 4 (four) hours as needed for mild pain. Patient taking differently: Take 325-650 mg by mouth every 4 (four) hours as needed for mild pain. Taking 1000mg  bid 04/06/20   Angiulli, Mcarthur Rossetti, PA-C  albuterol (VENTOLIN HFA) 108 (90 Base) MCG/ACT inhaler INHALE 2 PUFFS EVERY 6 HOURS AS NEEDED FOR WHEEZING OR SHORTNESS OF BREATH 08/25/21   Dettinger, Elige Radon, MD  amLODipine (NORVASC) 10 MG tablet Take 1 tablet (10 mg total) by mouth  daily. 03/09/22   Lennette Bihari, MD  guaiFENesin (MUCINEX) 600 MG 12 hr tablet Take 600 mg by mouth every 4 (four) hours as needed for cough or to loosen phlegm.    [provider]  ipratropium (ATROVENT) 0.03 % nasal spray  10/21/21   [provider]  levothyroxine (SYNTHROID) 50 MCG tablet Take 1 tablet (50 mcg total) by mouth daily. 07/07/22   Dettinger, Elige Radon, MD  methocarbamol (ROBAXIN) 500 MG tablet Take 1 tablet (500 mg total) by mouth 4 (four) times daily. 06/24/22   Bennie Pierini, FNP  metoprolol succinate (TOPROL-XL) 50 MG 24 hr tablet TAKE ONE TABLET AT BEDTIME 06/10/22   Duke, Roe Rutherford, PA  montelukast (SINGULAIR) 10 MG tablet Take 1 tablet (10 mg total) by mouth daily. 07/07/22   Dettinger, Elige Radon, MD  Olopatadine HCl 0.2 % SOLN Place 1 drop into both eyes daily as needed (allergies).    [provider]  pantoprazole (PROTONIX) 40 MG tablet TAKE 1 TABLET DAILY 07/07/22   Dettinger, Elige Radon, MD  rosuvastatin (CRESTOR) 20 MG tablet Take  1 tablet (20 mg total) by mouth daily. 03/09/22   Lennette Bihari, MD  traMADol (ULTRAM) 50 MG tablet Take 1 tablet (50 mg total) by mouth every 12 (twelve) hours as needed. 07/07/22   Dettinger, Elige Radon, MD  warfarin (COUMADIN) 5 MG tablet TAKE ONE TABLET ONCE DAILY AT 4PM 07/08/21   Dettinger, Elige Radon, MD    Family History    Family History  Problem Relation Age of Onset   Heart disease Mother    Hip fracture Mother 52   Osteoporosis Mother    Dementia Mother 69   Arthritis Father    Heart disease Father    Arthritis Sister    Arthritis Brother    Colon cancer Neg Hx    He indicated that his mother is deceased. He indicated that his father is deceased. He indicated that his sister is alive. He indicated that his brother is alive. He indicated that the status of his neg hx is unknown.  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  Tobacco Use   Smoking status: Former    Packs/day: 4.00    Years: 25.00    Additional pack years: 0.00    Total pack years: 100.00    Types: Cigarettes    Quit date: 06/23/1982    Years since quitting: 40.1   Smokeless tobacco: Never   Tobacco comments:    pt was a truck Chief Financial Officer Use: Never used  Substance and Sexual Activity   Alcohol use: Yes    Alcohol/week: 5.0 standard drinks of alcohol    Types: 5 Shots of liquor per week    Comment: brandy a few times a month, beer once a month   Drug use: No   Sexual activity: Yes    Birth control/protection: Surgical  Other Topics Concern   Not on file  Social History Narrative   Not on file   Social Determinants of Health   Financial Resource Strain: Low Risk  (11/19/2021)   Overall Financial Resource Strain (CARDIA)    Difficulty of Paying Living Expenses: Not hard at all  Food Insecurity: No Food Insecurity (03/08/2022)   Hunger Vital Sign    Worried About Running Out of Food in the Last Year: Never true    Ran Out of Food in the Last Year: Never true  Transportation Needs: No Transportation Needs (03/08/2022)   PRAPARE - Administrator, Civil Service (Medical): No    Lack of Transportation (Non-Medical): No  Physical Activity: Sufficiently Active (11/19/2021)   Exercise Vital Sign    Days of Exercise per Week: 5 days    Minutes of Exercise per Session: 40 min  Stress: No Stress Concern Present (11/19/2021)   [employer] of Occupational Health - Occupational Stress Questionnaire    Feeling of Stress : Not at all  Social Connections: Socially Integrated (11/19/2021)   Social Connection and Isolation Panel [NHANES]    Frequency of Communication with Friends and Family: More than three times a week     Frequency of Social Gatherings with Friends and Family: More than three times a week    Attends Religious  Services: More than 4 times per year    Active Member of Clubs or Organizations: Yes    Attends Banker Meetings: More than 4 times per year    Marital Status: Married  Catering manager Violence: Not At Risk (03/08/2022)   Humiliation, Afraid, Rape, and Kick questionnaire    Fear of Current or Ex-Partner: No    Emotionally Abused: No    Physically Abused: No    Sexually Abused: No     Review of Systems    General:  No chills, fever, night sweats or weight changes.  Cardiovascular:  No chest pain, dyspnea on exertion, edema, orthopnea, palpitations, paroxysmal nocturnal dyspnea. Dermatological: No rash, lesions/masses Respiratory: No cough, dyspnea Urologic: No hematuria, dysuria Abdominal:   No nausea, vomiting, diarrhea, bright red blood per rectum, melena, or hematemesis Neurologic:  No visual changes, wkns, changes in mental status. All other systems reviewed and are otherwise negative except as noted above.  Physical Exam    VS:  BP 118/60 (BP Location: Right Arm, Patient Position: Sitting, Cuff Size: Small)   Pulse 77   Ht 5\' 10"  (1.778 m)   Wt 178 lb (80.7 kg)   BMI 25.54 kg/m  , BMI Body mass index is 25.54 kg/m. GEN: Well nourished, well developed, in no acute distress. HEENT: normal. Neck: Supple, no JVD, carotid bruits, or masses. Cardiac: RRR, no murmurs, rubs, or gallops. No clubbing, cyanosis, edema.  Radials/DP/PT 2+ and equal bilaterally.  Respiratory:  Respirations regular and unlabored, clear to auscultation bilaterally. GI: Soft, nontender, nondistended, BS + x 4. MS: no deformity or atrophy. Skin: warm and dry, no rash. Neuro:  Strength and sensation are intact. Psych: Normal affect.  Accessory Clinical Findings    Recent Labs: 07/07/2022: ALT 18; BUN 14; Creatinine, Ser 1.09; Hemoglobin 15.1; Platelets 193; Potassium 4.0; Sodium 141;  TSH 1.110   Recent Lipid Panel    Component Value Date/Time   CHOL 157 07/07/2022 1028   CHOL 168 06/18/2012 1031   TRIG 107 07/07/2022 1028   TRIG 101 01/14/2013 1218   TRIG 102 06/18/2012 1031   HDL 68 07/07/2022 1028   HDL 65 01/14/2013 1218   HDL 62 06/18/2012 1031   CHOLHDL 2.3 07/07/2022 1028   LDLCALC 70 07/07/2022 1028   LDLCALC 99 01/14/2013 1218   LDLCALC 86 06/18/2012 1031         ECG personally reviewed by me today-normal sinus rhythm with occasional premature ventricular complexes 77 bpm- No acute changes  CT chest/abdomen/pelvis 03/17/2022  CTA CHEST FINDINGS   Cardiovascular: Repair of the ascending thoracic aortic dissection with a graft. The graft is widely patent. Again noted is a dissection involving the aortic arch and extending into the descending thoracic aorta. Aortic arch on sequence 6, image 50 measures approximately 4.5 cm and previously measured 4.4 cm. Great vessels remain patent. Again noted is a dissection extending into the left subclavian artery. Left vertebral artery is patent. Left subclavian artery dissection does not extend into the left axillary artery. Proximal common carotid arteries are patent bilaterally. Evidence for small right vertebral artery. Proximal descending thoracic aorta measures 4.3 cm and stable. Mid descending thoracic aorta at the level of the main pulmonary artery measures 3.4 cm and stable. Distal descending thoracic aorta measures 2.6 cm and stable. Aortic dissection extends into the abdominal aorta. Stable appearance of the heart with previous mitral valve repair.   Mediastinum/Nodes: No lymph node enlargement in the mediastinum or hila. Thyroid tissue is unremarkable.  No axillary lymph node enlargement. Esophagus is unremarkable.   Lungs/Pleura: Trachea and mainstem bronchi are patent. Stable elongated nodular density in the right middle lobe on sequence 8, image 116 measures up to 7 mm. Stable tiny nodular  density in the right upper lobe on sequence 8, image 25. No airspace disease or consolidation in the lungs. Stable branching densities in the periphery of the left upper lobe on sequence 8, image 64. No large pleural effusions. Stable subtle nodular density in the left upper lobe on sequence 8, image 28.   Musculoskeletal: No acute bone abnormality.   Review of the MIP images confirms the above findings.   CTA ABDOMEN AND PELVIS FINDINGS   VASCULAR   Aorta: Aortic dissection involving the entire abdominal aorta that extends into the iliac arteries bilaterally. Configuration of the abdominal aorta and dissection has not significantly changed. Supra celiac abdominal aorta measures 2.9 cm and stable. True lumen is along the left side of the proximal abdominal aorta.   Celiac: Celiac trunk originates from the true lumen. Celiac trunk is patent without aneurysm or dissection. No significant stenosis.   SMA: Dissection involving the origin the SMA but the flow appears to be predominantly from the true lumen. No significant stenosis or aneurysm involving the SMA.   Renals: Dissection involves the proximal right renal artery. Dissection most likely involves the proximal left renal artery. This configuration is unchanged. Bilateral renal arteries are patent without significant stenosis or aneurysm.   IMA: Originates from the false lumen and patent without aneurysm or significant stenosis.   Inflow: Dissection extends into the common iliac arteries bilaterally. Dissection extends into the right external iliac artery and terminates in the mid/distal aspect and stable from the previous examination. Left iliac dissection extends into the distal left external iliac artery and terminates in the proximal left common femoral artery region. Configuration and extent of the left iliac artery dissection has not significantly changed. Bilateral internal iliac arteries are patent and originating  from the true lumens. Proximal femoral arteries are patent bilaterally.   Veins: No obvious venous abnormality within the limitations of this arterial phase study.   Review of the MIP images confirms the above findings.   NON-VASCULAR   Hepatobiliary: Subtle hypodensity in the posterior right hepatic lobe on sequence 11, image 72 is nonspecific and likely an incidental finding. No biliary dilatation. Portal venous system is patent. Normal appearance of the gallbladder.   Pancreas: Unremarkable. No pancreatic ductal dilatation or surrounding inflammatory changes.   Spleen: Normal in size without focal abnormality.   Adrenals/Urinary Tract: Normal adrenal glands. Stable appearance of both kidneys without hydronephrosis. Probable left renal sinus cyst which is similar to the previous examination. Evidence for small cortical renal cysts. These cysts do not require dedicated follow-up. No suspicious renal lesions. Normal appearance of the urinary bladder.   Stomach/Bowel: Normal appearance of the stomach and small bowel. Colonic diverticulosis particularly in the sigmoid colon. Chronic densities along the origin of the left inguinal canal related to previous hernia repair. No evidence for acute colonic or bowel inflammation. Normal appendix without inflammatory changes. There is small bowel near the origin of the right inguinal canal.   Lymphatic: No lymph node enlargement in the abdomen or pelvis.   Reproductive: Prostate is upper limits of normal and stable the previous examination.   Other: Negative for free fluid. Negative for free air. Evidence for bilateral inguinal hernia repairs.   Musculoskeletal: Multilevel degenerative disc and facet disease in lumbar spine. No  acute bone abnormality.   Review of the MIP images confirms the above findings.   IMPRESSION: 1. Stable appearance of the residual dissection involving the aortic arch, descending thoracic aorta,  abdominal aorta and bilateral iliac arteries. No significant change in the size of the thoracic or abdominal aorta. Stable appearance of the surgically repaired ascending thoracic aorta. 2. No acute abnormality involving the chest, abdomen or pelvis. 3. Colonic diverticulosis without acute inflammation.     Electronically Signed   By: Richarda Overlie M.D.   On: 03/18/2022 10:52  Echocardiogram 04/15/2021 1. Left ventricular ejection fraction, by estimation, is 60 to 65%. Left  ventricular ejection fraction by 3D volume is 63 %. The left ventricle has  normal function. The left ventricle has no regional wall motion  abnormalities. There is mild asymmetric  left ventricular hypertrophy. Left ventricular diastolic parameters are  consistent with Grade I diastolic dysfunction (impaired relaxation).   2. Right ventricular systolic function is moderately reduced. The right  ventricular size is normal. There is normal pulmonary artery systolic  pressure.   3. Left atrial size was moderately dilated.   4. Right atrial size was mildly dilated.   5. The mitral valve has been repaired/replaced. Mild mitral valve  regurgitation.   6. The aortic valve is grossly normal. Aortic valve regurgitation is  mild.   Assessment & Plan   1.  Shortness of breath-EKG today shows sinus rhythm with occasional premature ventricular complexes 77 bpm. Notes increased shortness of breath over the last several weeks with rest and with activity. Order CBC, BMP  Paroxysmal atrial fibrillation-reports compliance with Coumadin.  Denies bleeding issues. Denies recent falls trauma or injury. Continue Coumadin, metoprolol Avoid triggers  Essential hypertension-BP today 118/60. Continue amlodipine, metoprolol Low-sodium diet Increase physical activity as tolerated   Aortic dissection-status post graft repair.  Denies chest and back pain. CTA chest/abdomen/pelvis showed widely patent graft/stable dissection with no  significant changes Maintain good blood pressure control Repeat chest abdomen pelvis CT 2025 Follows with Dr. Melrose Nakayama  Mitral valve prolapse-status post mitral valve repair.  Echocardiogram 04/15/2021 showed mild mitral valve regurgitation. Repeat echocardiogram  Disposition: Follow-up with Dr. Tresa Endo or me in 2-3 months.   Thomasene Ripple. Yarielis Funaro NP-C     07/26/2022, 3:48 PM [facility] [address] Office [phone] Fax [phone]    I spent 14 minutes examining this patient, reviewing medications, and using patient centered shared decision making involving her cardiac care.  Prior to her visit I spent greater than 20 minutes reviewing her past medical history,  medications, and prior cardiac tests.

## 2022-07-25 NOTE — Telephone Encounter (Signed)
noted 

## 2022-07-25 NOTE — Telephone Encounter (Signed)
Pt c/o Shortness Of Breath: STAT if SOB developed within the last 24 hours or pt is noticeably SOB on the phone  1. Are you currently SOB (can you hear that pt is SOB on the phone)? Yes but then wife took over the call  2. How long have you been experiencing SOB? Been going on a month  3. Are you SOB when sitting or when up moving around? Both   4. Are you currently experiencing any other symptoms? Very fatigue, no energy to do anything

## 2022-07-25 NOTE — Telephone Encounter (Signed)
Contacted patient, spoke with wife. She states for the past month, she has noticed his SOB has worsened. He can be up moving around and become very SOB, they only notice this when he is up moving around. They also state he has had no swelling or weight gain, has lost about 10 lbs in about a month. His BP yesterday was 127/70 and HR was 76. They do have a pulse ox and will use this to track his o2 level. Patient wife advised of NP appointment tomorrow at 2:45 PM. Patient wife verbalized understanding. Patient does have back issues with arthritis- and they have been giving him cortisone shots, they are unsure if this is contributing or not. Advised I would route to MD/NP who will be seeing patient.    CV[facility] team: is there anyway we can move his coumadin appointment to tomorrow? They live is [place] and hate to drive here twice. Advised I would check with your guys.  Thanks!

## 2022-07-26 ENCOUNTER — Ambulatory Visit: Payer: Medicare Other | Admitting: *Deleted

## 2022-07-26 ENCOUNTER — Ambulatory Visit: Payer: Medicare Other | Attending: General Practice | Admitting: General Practice

## 2022-07-26 ENCOUNTER — Encounter: Payer: Self-pay | Admitting: General Practice

## 2022-07-26 VITALS — BP 118/60 | HR 77 | Ht 70.0 in | Wt 178.0 lb

## 2022-07-26 DIAGNOSIS — R0602 Shortness of breath: Secondary | ICD-10-CM | POA: Diagnosis not present

## 2022-07-26 DIAGNOSIS — Z9889 Other specified postprocedural states: Secondary | ICD-10-CM

## 2022-07-26 DIAGNOSIS — I10 Essential (primary) hypertension: Secondary | ICD-10-CM | POA: Diagnosis not present

## 2022-07-26 DIAGNOSIS — I351 Nonrheumatic aortic (valve) insufficiency: Secondary | ICD-10-CM

## 2022-07-26 DIAGNOSIS — I480 Paroxysmal atrial fibrillation: Secondary | ICD-10-CM | POA: Diagnosis not present

## 2022-07-26 DIAGNOSIS — I71019 Dissection of thoracic aorta, unspecified: Secondary | ICD-10-CM

## 2022-07-26 LAB — POCT INR: INR: 3.2 — AB (ref 2.0–3.0)

## 2022-07-26 NOTE — Patient Instructions (Signed)
Description   Today take 1/2 tablet of warfarin then START taking 1/2 tablet of warfarin except 1 tablet of Monday, Wednesday, and Friday. Repeat INR in 1 week. Anticoagulation Clinic [phone]

## 2022-07-26 NOTE — Patient Instructions (Signed)
Medication Instructions:  The current medical regimen is effective;  continue present plan and medications as directed. Please refer to the Current Medication list given to you today.  *If you need a refill on your cardiac medications before your next appointment, please call your pharmacy*  Lab Work: CBC AND BMET TODAY If you have labs (blood work) drawn today and your tests are completely normal, you will receive your results only by:  MyChart Message (if you have MyChart) OR  A paper copy in the mail If you have any lab test that is abnormal or we need to change your treatment, we will call you to review the results.  Testing/Procedures: Echocardiogram - Your physician has requested that you have an echocardiogram. Echocardiography is a painless test that uses sound waves to create images of your heart. It provides your doctor with information about the size and shape of your heart and how well your heart's chambers and valves are working. This procedure takes approximately one hour. There are no restrictions for this procedure.    Follow-Up: At [facility], you and your health needs are our priority.  As part of our continuing mission to provide you with exceptional heart care, we have created designated Provider Care Teams.  These Care Teams include your primary Cardiologist (physician) and Advanced Practice Providers (APPs -  Physician Assistants and Nurse Practitioners) who all work together to provide you with the care you need, when you need it.  Your next appointment:   2-3 month(s)  Provider:   Nicki Guadalajara, MD  or Edd Fabian, FNP        Other Instructions

## 2022-07-27 DIAGNOSIS — M9904 Segmental and somatic dysfunction of sacral region: Secondary | ICD-10-CM | POA: Diagnosis not present

## 2022-07-27 DIAGNOSIS — M5137 Other intervertebral disc degeneration, lumbosacral region: Secondary | ICD-10-CM | POA: Diagnosis not present

## 2022-07-27 DIAGNOSIS — M9903 Segmental and somatic dysfunction of lumbar region: Secondary | ICD-10-CM | POA: Diagnosis not present

## 2022-07-27 DIAGNOSIS — M9905 Segmental and somatic dysfunction of pelvic region: Secondary | ICD-10-CM | POA: Diagnosis not present

## 2022-07-27 LAB — CBC
Hematocrit: 44.9 % (ref 37.5–51.0)
Hemoglobin: 14.6 g/dL (ref 13.0–17.7)
MCH: 28.7 pg (ref 26.6–33.0)
MCHC: 32.5 g/dL (ref 31.5–35.7)
MCV: 88 fL (ref 79–97)
Platelets: 280 10*3/uL (ref 150–450)
RBC: 5.08 x10E6/uL (ref 4.14–5.80)
RDW: 13 % (ref 11.6–15.4)
WBC: 6.2 10*3/uL (ref 3.4–10.8)

## 2022-07-27 LAB — BASIC METABOLIC PANEL
BUN/Creatinine Ratio: 20 (ref 10–24)
BUN: 21 mg/dL (ref 8–27)
CO2: 23 mmol/L (ref 20–29)
Calcium: 9.3 mg/dL (ref 8.6–10.2)
Chloride: 105 mmol/L (ref 96–106)
Creatinine, Ser: 1.07 mg/dL (ref 0.76–1.27)
Glucose: 88 mg/dL (ref 70–99)
Potassium: 4.8 mmol/L (ref 3.5–5.2)
Sodium: 143 mmol/L (ref 134–144)
eGFR: 69 mL/min/{1.73_m2} (ref >59)

## 2022-08-04 ENCOUNTER — Ambulatory Visit: Payer: Medicare Other | Attending: Cardiovascular Disease | Admitting: *Deleted

## 2022-08-04 DIAGNOSIS — Z9889 Other specified postprocedural states: Secondary | ICD-10-CM

## 2022-08-04 DIAGNOSIS — Z5181 Encounter for therapeutic drug level monitoring: Secondary | ICD-10-CM

## 2022-08-04 DIAGNOSIS — M5416 Radiculopathy, lumbar region: Secondary | ICD-10-CM | POA: Diagnosis not present

## 2022-08-04 DIAGNOSIS — I71019 Dissection of thoracic aorta, unspecified: Secondary | ICD-10-CM | POA: Diagnosis not present

## 2022-08-04 DIAGNOSIS — M25551 Pain in right hip: Secondary | ICD-10-CM | POA: Diagnosis not present

## 2022-08-04 LAB — POCT INR: POC INR: 4.3

## 2022-08-04 NOTE — Patient Instructions (Signed)
Description   Hold warfarin today and tomorrow. Then START taking warfarin 1/2 a tablet daily except for 1 tablet on Mondays and Fridays. Recheck INR in 2 weeks.  and [phone]

## 2022-08-09 DIAGNOSIS — M5416 Radiculopathy, lumbar region: Secondary | ICD-10-CM | POA: Diagnosis not present

## 2022-08-09 DIAGNOSIS — M25551 Pain in right hip: Secondary | ICD-10-CM | POA: Diagnosis not present

## 2022-08-12 DIAGNOSIS — M5416 Radiculopathy, lumbar region: Secondary | ICD-10-CM | POA: Diagnosis not present

## 2022-08-12 DIAGNOSIS — M25551 Pain in right hip: Secondary | ICD-10-CM | POA: Diagnosis not present

## 2022-08-15 DIAGNOSIS — M25551 Pain in right hip: Secondary | ICD-10-CM | POA: Diagnosis not present

## 2022-08-15 DIAGNOSIS — M5416 Radiculopathy, lumbar region: Secondary | ICD-10-CM | POA: Diagnosis not present

## 2022-08-16 ENCOUNTER — Telehealth: Payer: Self-pay | Admitting: *Deleted

## 2022-08-16 ENCOUNTER — Ambulatory Visit: Payer: Medicare Other

## 2022-08-16 NOTE — Telephone Encounter (Signed)
Pt overdue for INR check. Called pt and LMOM to call back.

## 2022-08-17 ENCOUNTER — Ambulatory Visit (INDEPENDENT_AMBULATORY_CARE_PROVIDER_SITE_OTHER): Payer: Medicare Other | Admitting: Family Medicine

## 2022-08-17 ENCOUNTER — Encounter: Payer: Self-pay | Admitting: Family Medicine

## 2022-08-17 VITALS — BP 116/61 | HR 101 | Temp 97.8°F | Ht 70.0 in | Wt 173.0 lb

## 2022-08-17 DIAGNOSIS — M9903 Segmental and somatic dysfunction of lumbar region: Secondary | ICD-10-CM | POA: Diagnosis not present

## 2022-08-17 DIAGNOSIS — M5137 Other intervertebral disc degeneration, lumbosacral region: Secondary | ICD-10-CM | POA: Diagnosis not present

## 2022-08-17 DIAGNOSIS — M9904 Segmental and somatic dysfunction of sacral region: Secondary | ICD-10-CM | POA: Diagnosis not present

## 2022-08-17 DIAGNOSIS — M5431 Sciatica, right side: Secondary | ICD-10-CM | POA: Diagnosis not present

## 2022-08-17 DIAGNOSIS — M9905 Segmental and somatic dysfunction of pelvic region: Secondary | ICD-10-CM | POA: Diagnosis not present

## 2022-08-17 MED ORDER — BETAMETHASONE SOD PHOS & ACET 6 (3-3) MG/ML IJ SUSP
6.0000 mg | Freq: Once | INTRAMUSCULAR | Status: AC
Start: 2022-08-17 — End: 2022-08-17
  Administered 2022-08-17: 6 mg via INTRAMUSCULAR

## 2022-08-17 MED ORDER — TRAMADOL HCL 50 MG PO TABS: 50.0000 mg | ORAL_TABLET | Freq: Four times a day (QID) | ORAL | 1 refills | Status: DC | PRN
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
## 2022-08-22 ENCOUNTER — Emergency Department (HOSPITAL_COMMUNITY): Payer: Medicare Other

## 2022-08-22 ENCOUNTER — Other Ambulatory Visit: Payer: Self-pay

## 2022-08-22 ENCOUNTER — Ambulatory Visit (HOSPITAL_COMMUNITY): Payer: Medicare Other

## 2022-08-22 ENCOUNTER — Encounter (HOSPITAL_COMMUNITY): Payer: Self-pay | Admitting: Emergency Medicine

## 2022-08-22 ENCOUNTER — Telehealth (HOSPITAL_COMMUNITY): Payer: Self-pay | Admitting: General Practice

## 2022-08-22 ENCOUNTER — Emergency Department (HOSPITAL_COMMUNITY)
Admission: EM | Admit: 2022-08-22 | Discharge: 2022-08-22 | Disposition: A | Discharge status: Home / Self Care | Payer: Medicare Other | Attending: Emergency Medicine | Admitting: Emergency Medicine

## 2022-08-22 DIAGNOSIS — R1030 Lower abdominal pain, unspecified: Secondary | ICD-10-CM | POA: Insufficient documentation

## 2022-08-22 DIAGNOSIS — J45909 Unspecified asthma, uncomplicated: Secondary | ICD-10-CM | POA: Insufficient documentation

## 2022-08-22 DIAGNOSIS — M25551 Pain in right hip: Secondary | ICD-10-CM | POA: Insufficient documentation

## 2022-08-22 DIAGNOSIS — M1611 Unilateral primary osteoarthritis, right hip: Secondary | ICD-10-CM | POA: Diagnosis not present

## 2022-08-22 DIAGNOSIS — E039 Hypothyroidism, unspecified: Secondary | ICD-10-CM | POA: Insufficient documentation

## 2022-08-22 DIAGNOSIS — I10 Essential (primary) hypertension: Secondary | ICD-10-CM | POA: Insufficient documentation

## 2022-08-22 DIAGNOSIS — Z743 Need for continuous supervision: Secondary | ICD-10-CM | POA: Diagnosis not present

## 2022-08-22 DIAGNOSIS — M25559 Pain in unspecified hip: Secondary | ICD-10-CM | POA: Diagnosis not present

## 2022-08-22 DIAGNOSIS — Z7901 Long term (current) use of anticoagulants: Secondary | ICD-10-CM | POA: Insufficient documentation

## 2022-08-22 DIAGNOSIS — Z79899 Other long term (current) drug therapy: Secondary | ICD-10-CM | POA: Diagnosis not present

## 2022-08-22 DIAGNOSIS — I499 Cardiac arrhythmia, unspecified: Secondary | ICD-10-CM | POA: Diagnosis not present

## 2022-08-22 DIAGNOSIS — S3289XA Fracture of other parts of pelvis, initial encounter for closed fracture: Secondary | ICD-10-CM | POA: Diagnosis not present

## 2022-08-22 DIAGNOSIS — R6889 Other general symptoms and signs: Secondary | ICD-10-CM | POA: Diagnosis not present

## 2022-08-22 LAB — CBC WITH DIFFERENTIAL/PLATELET
Abs Immature Granulocytes: 0.06 10*3/uL (ref 0.00–0.07)
Basophils Absolute: 0.1 10*3/uL (ref 0.0–0.1)
Basophils Relative: 1 %
Eosinophils Absolute: 0 10*3/uL (ref 0.0–0.5)
Eosinophils Relative: 0 %
HCT: 48.4 % (ref 39.0–52.0)
Hemoglobin: 16.2 g/dL (ref 13.0–17.0)
Immature Granulocytes: 1 %
Lymphocytes Relative: 10 %
Lymphs Abs: 0.9 10*3/uL (ref 0.7–4.0)
MCH: 29.7 pg (ref 26.0–34.0)
MCHC: 33.5 g/dL (ref 30.0–36.0)
MCV: 88.8 fL (ref 80.0–100.0)
Monocytes Absolute: 1 10*3/uL (ref 0.1–1.0)
Monocytes Relative: 11 %
Neutro Abs: 6.7 10*3/uL (ref 1.7–7.7)
Neutrophils Relative %: 77 %
Platelets: 190 10*3/uL (ref 150–400)
RBC: 5.45 MIL/uL (ref 4.22–5.81)
RDW: 14.3 % (ref 11.5–15.5)
WBC: 8.7 10*3/uL (ref 4.0–10.5)
nRBC: 0 % (ref 0.0–0.2)

## 2022-08-22 LAB — BASIC METABOLIC PANEL
Anion gap: 14 (ref 5–15)
BUN: 23 mg/dL (ref 8–23)
CO2: 21 mmol/L — ABNORMAL LOW (ref 22–32)
Calcium: 9.2 mg/dL (ref 8.9–10.3)
Chloride: 101 mmol/L (ref 98–111)
Creatinine, Ser: 1.13 mg/dL (ref 0.61–1.24)
GFR, Estimated: >60 mL/min (ref >60)
Glucose, Bld: 104 mg/dL — ABNORMAL HIGH (ref 70–99)
Potassium: 3.9 mmol/L (ref 3.5–5.1)
Sodium: 136 mmol/L (ref 135–145)

## 2022-08-22 MED ORDER — ONDANSETRON HCL 4 MG/2ML IJ SOLN
4.0000 mg | Freq: Once | INTRAMUSCULAR | Status: AC
  Administered 2022-08-22: 4 mg via INTRAVENOUS
  Filled 2022-08-22: qty 2

## 2022-08-22 MED ORDER — PREDNISONE 20 MG PO TABS
40.0000 mg | ORAL_TABLET | Freq: Once | ORAL | Status: AC
  Administered 2022-08-22: 40 mg via ORAL
  Filled 2022-08-22: qty 2

## 2022-08-22 MED ORDER — HYDROMORPHONE HCL 1 MG/ML IJ SOLN
1.0000 mg | Freq: Once | INTRAMUSCULAR | Status: AC
  Administered 2022-08-22: 1 mg via INTRAVENOUS
  Filled 2022-08-22: qty 1

## 2022-08-22 MED ORDER — PREDNISONE 10 MG PO TABS: 20.0000 mg | ORAL_TABLET | Freq: Two times a day (BID) | ORAL | 0 refills | Status: DC

## 2022-08-22 MED ORDER — SODIUM CHLORIDE 0.9 % IV BOLUS
1000.0000 mL | Freq: Once | INTRAVENOUS | Status: AC
  Administered 2022-08-22: 1000 mL via INTRAVENOUS

## 2022-08-22 MED ORDER — OXYCODONE-ACETAMINOPHEN 5-325 MG PO TABS: 1.0000 | ORAL_TABLET | Freq: Four times a day (QID) | ORAL | 0 refills | Status: DC | PRN

## 2022-08-22 NOTE — ED Provider Notes (Addendum)
[facility] EMERGENCY DEPARTMENT AT [facility] Provider Note   CSN: [id number] Arrival date & time: 08/22/22  0144     History  Chief Complaint  Patient presents with   Hip Pain    Patrick Bell is a 82 y.o. male.  Patient is an 82 year old male with past medical history of hypertension, asthma, hypothyroidism, atrial fibrillation.  Patient presenting today with complaints of right hip pain.  Pain has been present for the past week, but is significantly worsening.  He describes severe pain to the right anterior hip/groin that is worse when he extends his leg.  Pain is somewhat relieved with keeping his hip in a flexed position.  He has been unable to ambulate due to the discomfort.  He has been seen by his primary doctor and was prescribed tramadol, however this is not helping at all.  He has an appointment scheduled with orthopedics the end of June.  No fevers or chills.  He denies any specific injury or trauma.  The history is provided by the patient.       Home Medications Prior to Admission medications   Medication Sig Start Date End Date Taking? Authorizing Provider  acetaminophen (TYLENOL) 325 MG tablet Take 1-2 tablets (325-650 mg total) by mouth every 4 (four) hours as needed for mild pain. Patient taking differently: Take 2,000 mg by mouth daily. Taking 1000mg  bid 04/06/20   Angiulli, Mcarthur Rossetti, PA-C  albuterol (VENTOLIN HFA) 108 (90 Base) MCG/ACT inhaler INHALE 2 PUFFS EVERY 6 HOURS AS NEEDED FOR WHEEZING OR SHORTNESS OF BREATH 08/25/21   Dettinger, Elige Radon, MD  amLODipine (NORVASC) 10 MG tablet Take 1 tablet (10 mg total) by mouth daily. 03/09/22   Lennette Bihari, MD  guaiFENesin (MUCINEX) 600 MG 12 hr tablet Take 600 mg by mouth every 4 (four) hours as needed for cough or to loosen phlegm.    [provider]  levothyroxine (SYNTHROID) 50 MCG tablet Take 1 tablet (50 mcg total) by mouth daily. 07/07/22   Dettinger, Elige Radon, MD  metoprolol succinate  (TOPROL-XL) 50 MG 24 hr tablet TAKE ONE TABLET AT BEDTIME 06/10/22   [facility], Roe Rutherford, PA  montelukast (SINGULAIR) 10 MG tablet Take 1 tablet (10 mg total) by mouth daily. 07/07/22   Dettinger, Elige Radon, MD  Olopatadine HCl 0.2 % SOLN Place 1 drop into both eyes daily as needed (allergies).    [provider]  pantoprazole (PROTONIX) 40 MG tablet TAKE 1 TABLET DAILY 07/07/22   Dettinger, Elige Radon, MD  rosuvastatin (CRESTOR) 20 MG tablet Take 1 tablet (20 mg total) by mouth daily. 03/09/22   Lennette Bihari, MD  traMADol (ULTRAM) 50 MG tablet Take 1 tablet (50 mg total) by mouth 4 (four) times daily as needed for severe pain. 08/17/22 09/16/22  Mechele Claude, MD  warfarin (COUMADIN) 5 MG tablet TAKE ONE TABLET ONCE DAILY AT 4PM 07/08/21   Dettinger, Elige Radon, MD      Allergies    Patient has no known allergies.    Review of Systems   Review of Systems  All other systems reviewed and are negative.   Physical Exam Updated Vital Signs BP 138/81 (BP Location: Right Arm)   Pulse 88   Temp 97.7 F (36.5 C) (Oral)   Resp 20   Ht 5\' 10"  (1.778 m)   Wt 78 kg   SpO2 100%   BMI 24.67 kg/m  Physical Exam Vitals and nursing note reviewed.  Constitutional:  General: He is not in acute distress.    Appearance: Normal appearance. He is not ill-appearing.  HENT:     Head: Normocephalic and atraumatic.  Pulmonary:     Effort: Pulmonary effort is normal.  Musculoskeletal:     Comments: There is tenderness to palpation in the right groin/inguinal region.  There is no palpable hernia or other abnormality.  Pain exacerbated with extension of the hip and relieved with flexion.  DP pulses easily palpable and motor and sensation are intact throughout the entire foot and leg.  Skin:    General: Skin is warm and dry.  Neurological:     Mental Status: He is alert.     ED Results / Procedures / Treatments   Labs (all labs ordered are listed, but only abnormal results are displayed) Labs  Reviewed  BASIC METABOLIC PANEL  CBC WITH DIFFERENTIAL/PLATELET    EKG None  Radiology No results found.  Procedures Procedures    Medications Ordered in ED Medications  sodium chloride 0.9 % bolus 1,000 mL (has no administration in time range)  ondansetron (ZOFRAN) injection 4 mg (has no administration in time range)  HYDROmorphone (DILAUDID) injection 1 mg (has no administration in time range)    ED Course/ Medical Decision Making/ A&P  Patient is an 82 year old male presenting with complaints of pain in his right groin and hip worsening over the past week.  He has been taking tramadol at home with little relief.  Patient arrives here with stable vital signs.  He is afebrile, but appears uncomfortable.  He has pain with extension of the hip joint and relief with flexion.  CBC and basic metabolic panel obtained showing no significant abnormality.  Patient had x-rays of the hip by his primary doctor, so I elected to obtain a CT scan of the hip and CT scan of the pelvis.  He does have some osteopenia and degenerative change, but no evidence of fracture.  There were no other significant abnormalities that would explain his discomfort.  Patient was given IV Dilaudid and seems to be feeling somewhat better.  He was also given a dose of oral prednisone.  At this point, I feel as though patient can be discharged.  I will prescribe a course of prednisone, Percocet, and have him follow-up with orthopedics in the near future.  The cause of his discomfort felt to be degenerative joint disease with inflammation.  I have considered, but doubt a septic joint.  Patient has no leukocytosis and no fever along with no warmth over the joint.  Also, he has no history of gout and doubt this possibility, but prednisone could potentially help if this was the cause.  Final Clinical Impression(s) / ED Diagnoses Final diagnoses:  None    Rx / DC Orders ED Discharge Orders     None          Geoffery Lyons, MD 08/22/22 1610    Geoffery Lyons, MD 08/22/22 0502

## 2022-08-22 NOTE — Telephone Encounter (Signed)
Patient called and cancelled echocardiogram for 08/22/22. He was in the ER this am and doesn't wish to reschedule at this time. Order will be removed from the echo WQ. If he calls back we will reinstate the order. Thank you.

## 2022-08-22 NOTE — Discharge Instructions (Signed)
Begin taking prednisone as prescribed.  Begin taking Percocet as prescribed as needed for pain.  Follow-up with orthopedics in the near future, and return to the ER if symptoms significantly worsen or change.

## 2022-08-22 NOTE — ED Triage Notes (Signed)
Pt brought in by EMS with c/o Rt hip pain from his arthritis. States he has an appointment at the end of June with [facility] but states he cannot wait until then for pain relief. States he was given Ultram by his PCP but that it is not helping.

## 2022-08-26 ENCOUNTER — Ambulatory Visit (INDEPENDENT_AMBULATORY_CARE_PROVIDER_SITE_OTHER): Payer: Medicare Other | Admitting: Family Medicine

## 2022-08-26 ENCOUNTER — Encounter: Payer: Self-pay | Admitting: Family Medicine

## 2022-08-26 VITALS — BP 118/61 | HR 78 | Ht 70.0 in | Wt 176.0 lb

## 2022-08-26 DIAGNOSIS — M5136 Other intervertebral disc degeneration, lumbar region: Secondary | ICD-10-CM | POA: Diagnosis not present

## 2022-08-26 DIAGNOSIS — M5431 Sciatica, right side: Secondary | ICD-10-CM

## 2022-08-26 MED ORDER — OXYCODONE-ACETAMINOPHEN 5-325 MG PO TABS: 1.0000 | ORAL_TABLET | Freq: Four times a day (QID) | ORAL | 0 refills | Status: DC | PRN

## 2022-08-26 NOTE — Progress Notes (Signed)
BP 118/61   Pulse 78   Ht 5\' 10"  (1.778 m)   Wt 176 lb (79.8 kg)   SpO2 96%   BMI 25.25 kg/m    Subjective:   Patient ID: Patrick Bell, male    DOB: 08/08/1940, 82 y.o.   MRN: [id number]  HPI: KINDRICK KRAAI is a 82 y.o. male presenting on 08/26/2022 for Hip Pain (right)   HPI Right hip and lower back pain Patient comes in complaining of right hip and lower back pain it has been worsening and increasing.  He was taking tramadol and says it was not working anymore.  He says is been continued to be severe especially on the lateral aspect of his right hip.  He did go to emergency department had a CT scan of his lumbar spine and his right hip and found degeneration and bone spurs in the SI joint and degeneration in the lower spine.  He does have an orthopedic appointment to see them in 2 weeks.  He says it has been more severe and he has not been doing as well.  The Percocet is helping some and he is on some steroids as well that are helping some.  Relevant past medical, surgical, family and social history reviewed and updated as indicated. Interim medical history since our last visit reviewed. Allergies and medications reviewed and updated.  Review of Systems  Constitutional:  Negative for chills and fever.  Eyes:  Negative for visual disturbance.  Respiratory:  Negative for shortness of breath and wheezing.   Cardiovascular:  Negative for chest pain and leg swelling.  Musculoskeletal:  Positive for arthralgias, back pain, gait problem and myalgias.  Skin:  Negative for rash.  All other systems reviewed and are negative.   Per HPI unless specifically indicated above   Allergies as of 08/26/2022   No Known Allergies      Medication List        Accurate as of Aug 26, 2022  9:06 AM. If you have any questions, ask your nurse or doctor.          STOP taking these medications    traMADol 50 MG tablet Commonly known as: ULTRAM Stopped by: Elige Radon Omran Keelin, MD        TAKE these medications    acetaminophen 325 MG tablet Commonly known as: TYLENOL Take 1-2 tablets (325-650 mg total) by mouth every 4 (four) hours as needed for mild pain. What changed:  how much to take when to take this additional instructions   albuterol 108 (90 Base) MCG/ACT inhaler Commonly known as: VENTOLIN HFA INHALE 2 PUFFS EVERY 6 HOURS AS NEEDED FOR WHEEZING OR SHORTNESS OF BREATH   amLODipine 10 MG tablet Commonly known as: NORVASC Take 1 tablet (10 mg total) by mouth daily.   guaiFENesin 600 MG 12 hr tablet Commonly known as: MUCINEX Take 600 mg by mouth every 4 (four) hours as needed for cough or to loosen phlegm.   levothyroxine 50 MCG tablet Commonly known as: SYNTHROID Take 1 tablet (50 mcg total) by mouth daily.   metoprolol succinate 50 MG 24 hr tablet Commonly known as: TOPROL-XL TAKE ONE TABLET AT BEDTIME   montelukast 10 MG tablet Commonly known as: SINGULAIR Take 1 tablet (10 mg total) by mouth daily.   Olopatadine HCl 0.2 % Soln Place 1 drop into both eyes daily as needed (allergies).   oxyCODONE-acetaminophen 5-325 MG tablet Commonly known as: Percocet Take 1-2 tablets by mouth every 6 (  six) hours as needed.   pantoprazole 40 MG tablet Commonly known as: PROTONIX TAKE 1 TABLET DAILY   predniSONE 10 MG tablet Commonly known as: DELTASONE Take 2 tablets (20 mg total) by mouth 2 (two) times daily.   rosuvastatin 20 MG tablet Commonly known as: CRESTOR Take 1 tablet (20 mg total) by mouth daily.   warfarin 5 MG tablet Commonly known as: COUMADIN Take as directed by the anticoagulation clinic. If you are unsure how to take this medication, talk to your nurse or doctor. Original instructions: TAKE ONE TABLET ONCE DAILY AT 4PM         Objective:   BP 118/61   Pulse 78   Ht 5\' 10"  (1.778 m)   Wt 176 lb (79.8 kg)   SpO2 96%   BMI 25.25 kg/m   Wt Readings from Last 3 Encounters:  08/26/22 176 lb (79.8 kg)  08/22/22 171 lb  15.3 oz (78 kg)  08/17/22 173 lb (78.5 kg)    Physical Exam Vitals and nursing note reviewed.  Constitutional:      General: He is not in acute distress.    Appearance: He is well-developed. He is not diaphoretic.  Eyes:     General: No scleral icterus.    Conjunctiva/sclera: Conjunctivae normal.  Neck:     Thyroid: No thyromegaly.  Musculoskeletal:        General: No swelling. Normal range of motion.     Cervical back: Neck supple.     Lumbar back: Tenderness (Pain with laying flat) present. No bony tenderness.     Right hip: Normal. No deformity or tenderness. Normal range of motion.     Left hip: Normal. No deformity or tenderness. Normal range of motion.  Lymphadenopathy:     Cervical: No cervical adenopathy.  Skin:    General: Skin is warm and dry.     Findings: No rash.  Neurological:     Mental Status: He is alert and oriented to person, place, and time.     Coordination: Coordination normal.  Psychiatric:        Behavior: Behavior normal.       Assessment & Plan:   Problem List Items Addressed This Visit   None Visit Diagnoses     Degenerative disc disease, lumbar    -  Primary   Relevant Medications   oxyCODONE-acetaminophen (PERCOCET) 5-325 MG tablet   Sciatica of right side           Will give more Percocet, patient is seeing orthopedics soon and hopefully they can help with answers but it seems more seated lumbar or SI joint related rather than hip itself. Follow up plan: No follow-ups on file.  Counseling provided for all of the vaccine components No orders of the defined types were placed in this encounter.   Arville Care, MD [facility] 08/26/2022, 9:06 AM

## 2022-09-01 ENCOUNTER — Telehealth: Payer: Self-pay

## 2022-09-01 ENCOUNTER — Telehealth: Payer: Self-pay | Admitting: Family Medicine

## 2022-09-01 NOTE — Telephone Encounter (Signed)
Transition Care Management Unsuccessful Follow-up Telephone Call  Date of discharge and from where:  08/22/2022 [facility]  Attempts:  1st Attempt  Reason for unsuccessful TCM follow-up call:  Left voice message  Tyia Binford [place]  [facility] Population Health Community Resource Care Guide   [email or web address]  ?? [phone]   Website: [email or web address]  [email or web address]      

## 2022-09-01 NOTE — Telephone Encounter (Signed)
Pt has plenty of his pain medication to get him through to his ortho appt, he was talking about the steroid medication, explained they like to have this as a short course medication, pt verbalized understanding.

## 2022-09-01 NOTE — Telephone Encounter (Signed)
  Prescription Request  09/01/2022  Is this a "Controlled Substance" medicine?   Have you seen your PCP in the last 2 weeks? Had appt 08/26/22   If YES, route message to pool  -  If NO, patient needs to be scheduled for appointment.  What is the name of the medication or equipment? oxyCODONE-acetaminophen (PERCOCET) 5-325 MG tablet   Have you contacted your pharmacy to request a refill? yes   Which pharmacy would you like this sent to? oxyCODONE-acetaminophen (PERCOCET) 5-325 MG tablet   Patient wants to know if this can be refilled or if he needs to wait until he is seen by ortho doctor. York Spaniel he has been out for 3-4 days and feels like he is getting worse.    Patient notified that their request is being sent to the clinical staff for review and that they should receive a response within 2 business days.

## 2022-09-02 ENCOUNTER — Telehealth: Payer: Self-pay

## 2022-09-02 NOTE — Telephone Encounter (Signed)
Transition Care Management Unsuccessful Follow-up Telephone Call  Date of discharge and from where:  08/22/2022 [facility]  Attempts:  2nd Attempt  Reason for unsuccessful TCM follow-up call:  Left voice message  Raechell Singleton [facility]  [facility] Population Health Community Resource Care Guide   [email or web address]  ?? [phone]   Website: [email or web address]

## 2022-09-05 ENCOUNTER — Ambulatory Visit (INDEPENDENT_AMBULATORY_CARE_PROVIDER_SITE_OTHER): Payer: Medicare Other | Admitting: Nurse Practitioner

## 2022-09-05 ENCOUNTER — Encounter: Payer: Self-pay | Admitting: Nurse Practitioner

## 2022-09-05 ENCOUNTER — Ambulatory Visit (HOSPITAL_COMMUNITY)
Admission: RE | Admit: 2022-09-05 | Discharge: 2022-09-05 | Disposition: A | Discharge status: Home / Self Care | Payer: Medicare Other | Source: Ambulatory Visit | Attending: Nurse Practitioner | Admitting: Nurse Practitioner

## 2022-09-05 VITALS — BP 113/62 | HR 94 | Temp 98.0°F | Ht 70.0 in | Wt 177.8 lb

## 2022-09-05 DIAGNOSIS — R609 Edema, unspecified: Secondary | ICD-10-CM | POA: Insufficient documentation

## 2022-09-05 DIAGNOSIS — M79662 Pain in left lower leg: Secondary | ICD-10-CM | POA: Diagnosis not present

## 2022-09-05 DIAGNOSIS — M79661 Pain in right lower leg: Secondary | ICD-10-CM | POA: Diagnosis not present

## 2022-09-05 DIAGNOSIS — I82463 Acute embolism and thrombosis of calf muscular vein, bilateral: Secondary | ICD-10-CM

## 2022-09-05 NOTE — Progress Notes (Signed)
Acute Office Visit  Subjective:     Patient ID: Patrick Bell, male    DOB: 03-30-1941, 82 y.o.   MRN: [id number]  Chief Complaint  Patient presents with   Foot Swelling    Bilateral foot swelling. Started about 1 week ago and has gotten worse. Worse on right side   Dizziness    Started when swelling started.   Groin Pain    Been going on for a month. More painful on right side.     HPI Patrick Bell 82 year old male present with his wife for an acute visit for concern for bilateral pitting edema, and groin pain. He has history of PVC, mitral valve repair and aortic dissection currently on Warfarin tat is being managed by his cardiologist. He reports that his symptoms stared a week ago " my legs are swelling and I am feeling weaker and weaker most the right legs. I am able to pull myself off the toilet without using the bar". He reports pain in his calf and his wife concerns for DVT.  We discuss ordering [place] STAT to r/o DVT. Denies SOB, chest pain.  He has a referral to see Ortho for hip and groin pain and currently on pain relief " I am averaging 4-5 Oxy daily, it is barely touching the pain". Reports 8-10 pain, it feels like someone has is stabbing me"> explains to him that he may need to talk to his PCP about  ar referral to pain management. He has an appointment to see Ortho for hip pain and back   ROS Negative unless indicated in HPI    Objective:    BP 113/62   Pulse 94   Temp 98 F (36.7 C) (Temporal)   Ht 5\' 10"  (1.778 m)   Wt 177 lb 12.8 oz (80.6 kg)   SpO2 96%   BMI 25.51 kg/m    Physical Exam Constitutional:      Appearance: Normal appearance. He is normal weight.  HENT:     Head: Normocephalic and atraumatic.  Cardiovascular:     Rate and Rhythm: Normal rate.     Heart sounds: Normal heart sounds.  Pulmonary:     Effort: Pulmonary effort is normal.     Breath sounds: Normal breath sounds. No wheezing.  Musculoskeletal:     Right lower leg: No  tenderness. 2+ Pitting Edema present.     Left lower leg: No tenderness. 2+ Pitting Edema present.  Neurological:     General: No focal deficit present.     Mental Status: He is alert and oriented to person, place, and time. Mental status is at baseline.  Psychiatric:        Mood and Affect: Mood normal.        Behavior: Behavior normal.        Thought Content: Thought content normal.        Judgment: Judgment normal.     No results found for any visits on 09/05/22.      Assessment & Plan:  2+ pitting edema -     US Venous Img Lower Bilateral (DVT); Future  Acute deep vein thrombosis (DVT) of calf muscle vein of both lower extremities (HCC) -     US Venous Img Lower Bilateral (DVT); Future   Bilateral pitting Edema +2 will order [place] (STAT) to r/o DVT - continue Oxycodone 325 1-2 tab Q6hrs PRN for pain Follow up with Ortho as already scheduled Plan of care discuss with client and  his wife  Return in about 1 week (around 09/12/2022) for follow-up.  [address] Santa Lighter DNP

## 2022-09-09 ENCOUNTER — Telehealth: Payer: Self-pay | Admitting: Cardiovascular Disease

## 2022-09-09 ENCOUNTER — Ambulatory Visit: Payer: Medicare Other | Attending: Internal Medicine | Admitting: *Deleted

## 2022-09-09 DIAGNOSIS — I71019 Dissection of thoracic aorta, unspecified: Secondary | ICD-10-CM

## 2022-09-09 DIAGNOSIS — Z9889 Other specified postprocedural states: Secondary | ICD-10-CM | POA: Diagnosis not present

## 2022-09-09 DIAGNOSIS — M7061 Trochanteric bursitis, right hip: Secondary | ICD-10-CM | POA: Diagnosis not present

## 2022-09-09 LAB — POCT INR: INR: 1.9 — AB (ref 2.0–3.0)

## 2022-09-09 NOTE — Patient Instructions (Addendum)
Description   Today take 1.5 tablets of warfarin then continue taking warfarin 1/2 tablet daily except for 1 tablet on Mondays and Fridays. Recheck INR in 2 weeks. [phone]

## 2022-09-09 NOTE — Telephone Encounter (Signed)
Spoke to the patient, he is schedule for an echocardiogram on 6/28 however, his prior auth expires on 6/21. Pt will like to reschedule his echocardiogram for an earlier date. Will forward to the scheduling pool.

## 2022-09-09 NOTE — Telephone Encounter (Signed)
Patient states his insurance authorization for 6/28 echo will expire on 6/21. He is requesting a new order to have the auth extended or if something is able to communicate the scheduled date with his insurance.

## 2022-09-12 ENCOUNTER — Encounter: Payer: Self-pay | Admitting: Family Medicine

## 2022-09-12 ENCOUNTER — Ambulatory Visit (INDEPENDENT_AMBULATORY_CARE_PROVIDER_SITE_OTHER): Payer: Medicare Other | Admitting: Family Medicine

## 2022-09-12 ENCOUNTER — Other Ambulatory Visit: Payer: Self-pay | Admitting: Family Medicine

## 2022-09-12 VITALS — BP 134/75 | HR 76 | Ht 70.0 in | Wt 171.0 lb

## 2022-09-12 DIAGNOSIS — M5136 Other intervertebral disc degeneration, lumbar region: Secondary | ICD-10-CM | POA: Diagnosis not present

## 2022-09-12 DIAGNOSIS — R600 Localized edema: Secondary | ICD-10-CM | POA: Diagnosis not present

## 2022-09-12 NOTE — Progress Notes (Signed)
BP 134/75   Pulse 76   Ht 5\' 10"  (1.778 m)   Wt 171 lb (77.6 kg)   SpO2 97%   BMI 24.54 kg/m    Subjective:   Patient ID: Patrick Bell, male    DOB: 03-06-1941, 82 y.o.   MRN: [id number]  HPI: Patrick Bell is a 82 y.o. male presenting on 09/12/2022 for Medical Management of Chronic Issues and Back Pain   HPI Patient is coming in today for follow-up on leg swelling.  He was seen last week and had a DVT ultrasound which came back negative and he says the swelling is completely better and is actually down 5 pounds, likely from fluid.  He says he is feeling a lot better and get around more he is starting to do more exercise and activity as recommended by the orthopedic.  Relevant past medical, surgical, family and social history reviewed and updated as indicated. Interim medical history since our last visit reviewed. Allergies and medications reviewed and updated.  Review of Systems  Constitutional:  Negative for chills and fever.  Respiratory:  Negative for shortness of breath and wheezing.   Cardiovascular:  Negative for chest pain and leg swelling.  Musculoskeletal:  Positive for arthralgias, gait problem and myalgias. Negative for back pain.  Skin:  Negative for rash.  All other systems reviewed and are negative.   Per HPI unless specifically indicated above   Allergies as of 09/12/2022   No Known Allergies      Medication List        Accurate as of September 12, 2022 11:51 AM. If you have any questions, ask your nurse or doctor.          acetaminophen 325 MG tablet Commonly known as: TYLENOL Take 1-2 tablets (325-650 mg total) by mouth every 4 (four) hours as needed for mild pain. What changed:  how much to take when to take this additional instructions   albuterol 108 (90 Base) MCG/ACT inhaler Commonly known as: VENTOLIN HFA INHALE 2 PUFFS EVERY 6 HOURS AS NEEDED FOR WHEEZING OR SHORTNESS OF BREATH   amLODipine 10 MG tablet Commonly known as:  NORVASC Take 1 tablet (10 mg total) by mouth daily.   guaiFENesin 600 MG 12 hr tablet Commonly known as: MUCINEX Take 600 mg by mouth every 4 (four) hours as needed for cough or to loosen phlegm.   levothyroxine 50 MCG tablet Commonly known as: SYNTHROID Take 1 tablet (50 mcg total) by mouth daily.   metoprolol succinate 50 MG 24 hr tablet Commonly known as: TOPROL-XL TAKE ONE TABLET AT BEDTIME   montelukast 10 MG tablet Commonly known as: SINGULAIR Take 1 tablet (10 mg total) by mouth daily.   Olopatadine HCl 0.2 % Soln Place 1 drop into both eyes daily as needed (allergies).   oxyCODONE-acetaminophen 5-325 MG tablet Commonly known as: Percocet Take 1-2 tablets by mouth every 6 (six) hours as needed.   pantoprazole 40 MG tablet Commonly known as: PROTONIX TAKE 1 TABLET DAILY   predniSONE 10 MG tablet Commonly known as: DELTASONE Take 2 tablets (20 mg total) by mouth 2 (two) times daily.   rosuvastatin 20 MG tablet Commonly known as: CRESTOR Take 1 tablet (20 mg total) by mouth daily.   warfarin 5 MG tablet Commonly known as: COUMADIN Take as directed by the anticoagulation clinic. If you are unsure how to take this medication, talk to your nurse or doctor. Original instructions: TAKE ONE TABLET ONCE DAILY AT 4PM  Objective:   BP 134/75   Pulse 76   Ht 5\' 10"  (1.778 m)   Wt 171 lb (77.6 kg)   SpO2 97%   BMI 24.54 kg/m   Wt Readings from Last 3 Encounters:  09/12/22 171 lb (77.6 kg)  09/05/22 177 lb 12.8 oz (80.6 kg)  08/26/22 176 lb (79.8 kg)    Physical Exam Vitals and nursing note reviewed.  Constitutional:      General: He is not in acute distress.    Appearance: He is well-developed. He is not diaphoretic.  Eyes:     General: No scleral icterus.    Conjunctiva/sclera: Conjunctivae normal.  Neck:     Thyroid: No thyromegaly.  Musculoskeletal:     Cervical back: Neck supple.  Lymphadenopathy:     Cervical: No cervical adenopathy.   Skin:    General: Skin is warm and dry.     Findings: No rash.  Neurological:     Mental Status: He is alert and oriented to person, place, and time.     Coordination: Coordination normal.  Psychiatric:        Behavior: Behavior normal.       Assessment & Plan:   Problem List Items Addressed This Visit       Other   Peripheral edema   Other Visit Diagnoses     Degenerative disc disease, lumbar    -  Primary       His edema is clear today and he denies any pain in his calves or lower legs.  He still is having the back pain but it is improving with exercises and therapy.  He does see orthopedic for this. Follow up plan: Return if symptoms worsen or fail to improve, for 1-1.5 month pain f/u.  Counseling provided for all of the vaccine components No orders of the defined types were placed in this encounter.   Arville Care, MD [facility] 09/12/2022, 11:51 AM

## 2022-09-16 ENCOUNTER — Ambulatory Visit: Payer: Medicare Other | Admitting: Cardiovascular Disease

## 2022-09-20 DIAGNOSIS — M25551 Pain in right hip: Secondary | ICD-10-CM | POA: Diagnosis not present

## 2022-09-20 DIAGNOSIS — M5416 Radiculopathy, lumbar region: Secondary | ICD-10-CM | POA: Diagnosis not present

## 2022-09-23 ENCOUNTER — Ambulatory Visit: Payer: Medicare Other | Attending: Cardiovascular Disease | Admitting: *Deleted

## 2022-09-23 DIAGNOSIS — I71019 Dissection of thoracic aorta, unspecified: Secondary | ICD-10-CM | POA: Diagnosis not present

## 2022-09-23 DIAGNOSIS — Z9889 Other specified postprocedural states: Secondary | ICD-10-CM | POA: Diagnosis not present

## 2022-09-23 DIAGNOSIS — Z5181 Encounter for therapeutic drug level monitoring: Secondary | ICD-10-CM

## 2022-09-23 LAB — POCT INR: INR: 1.8 — AB (ref 2.0–3.0)

## 2022-09-23 NOTE — Patient Instructions (Signed)
Description   Today take 1.5 tablets of warfarin then START taking warfarin 1/2 tablet daily except for 1 tablet on Mondays, Wednesdays, and Fridays. Recheck INR in 2 weeks. [phone]

## 2022-09-30 ENCOUNTER — Encounter (HOSPITAL_COMMUNITY): Payer: Self-pay

## 2022-09-30 ENCOUNTER — Ambulatory Visit (HOSPITAL_COMMUNITY): Payer: Medicare Other | Attending: General Practice

## 2022-09-30 DIAGNOSIS — Z9889 Other specified postprocedural states: Secondary | ICD-10-CM

## 2022-09-30 DIAGNOSIS — I71019 Dissection of thoracic aorta, unspecified: Secondary | ICD-10-CM | POA: Insufficient documentation

## 2022-09-30 LAB — ECHOCARDIOGRAM COMPLETE
AR max vel: 3.46 cm2
AV Area VTI: 3.63 cm2
AV Area mean vel: 3.53 cm2
AV Mean grad: 2 mmHg
AV Peak grad: 4 mmHg
Ao pk vel: 1 m/s
Area-P 1/2: 3.28 cm2
Est EF: 55
MV VTI: 2.12 cm2
S' Lateral: 3.4 cm

## 2022-10-04 ENCOUNTER — Ambulatory Visit: Payer: Medicare Other

## 2022-10-04 ENCOUNTER — Ambulatory Visit: Payer: Medicare Other | Admitting: General Practice

## 2022-10-05 ENCOUNTER — Other Ambulatory Visit: Payer: Self-pay

## 2022-10-05 DIAGNOSIS — I71019 Dissection of thoracic aorta, unspecified: Secondary | ICD-10-CM

## 2022-10-05 DIAGNOSIS — Z9889 Other specified postprocedural states: Secondary | ICD-10-CM

## 2022-10-10 DIAGNOSIS — G4733 Obstructive sleep apnea (adult) (pediatric): Secondary | ICD-10-CM | POA: Diagnosis not present

## 2022-10-13 ENCOUNTER — Ambulatory Visit: Payer: Medicare Other | Attending: Internal Medicine

## 2022-10-13 DIAGNOSIS — I71019 Dissection of thoracic aorta, unspecified: Secondary | ICD-10-CM | POA: Diagnosis not present

## 2022-10-13 DIAGNOSIS — Z9889 Other specified postprocedural states: Secondary | ICD-10-CM

## 2022-10-13 LAB — POCT INR: INR: 2.4 (ref 2.0–3.0)

## 2022-10-13 NOTE — Patient Instructions (Signed)
Description   Continue on same dosage of Warfarin 1/2 tablet daily except for 1 tablet on Mondays, Wednesdays, and Fridays. Recheck INR in 6 weeks per pt request, seeing Edd Fabian, NP same day. [phone]

## 2022-10-21 ENCOUNTER — Ambulatory Visit: Payer: Medicare Other | Admitting: Family Medicine

## 2022-10-24 DIAGNOSIS — M5451 Vertebrogenic low back pain: Secondary | ICD-10-CM | POA: Diagnosis not present

## 2022-10-24 DIAGNOSIS — M7061 Trochanteric bursitis, right hip: Secondary | ICD-10-CM | POA: Diagnosis not present

## 2022-10-24 DIAGNOSIS — M5459 Other low back pain: Secondary | ICD-10-CM | POA: Diagnosis not present

## 2022-10-25 DIAGNOSIS — L570 Actinic keratosis: Secondary | ICD-10-CM | POA: Diagnosis not present

## 2022-10-25 DIAGNOSIS — D044 Carcinoma in situ of skin of scalp and neck: Secondary | ICD-10-CM | POA: Diagnosis not present

## 2022-11-07 DIAGNOSIS — M5459 Other low back pain: Secondary | ICD-10-CM | POA: Diagnosis not present

## 2022-11-14 DIAGNOSIS — M792 Neuralgia and neuritis, unspecified: Secondary | ICD-10-CM | POA: Diagnosis not present

## 2022-11-14 DIAGNOSIS — M48061 Spinal stenosis, lumbar region without neurogenic claudication: Secondary | ICD-10-CM | POA: Diagnosis not present

## 2022-11-14 DIAGNOSIS — M545 Low back pain, unspecified: Secondary | ICD-10-CM | POA: Diagnosis not present

## 2022-11-15 NOTE — Progress Notes (Unsigned)
Cardiology Clinic Note   Patient Name: Patrick Bell Date of Encounter: 11/24/2022  Primary Care Provider:  Dettinger, Elige Radon, MD Primary Cardiologist:  Nicki Guadalajara, MD  Patient Profile    Patrick Bell 82 year old male presents to the clinic today for follow-up evaluation of his essential hypertension and atrial fibrillation.  Past Medical History    Past Medical History:  Diagnosis Date   Acute thoracic aortic dissection (HCC) 03/11/2020   Type A   Allergy    Asthma    Cataract    Cataract    Diverticulitis    Dyspnea    GERD (gastroesophageal reflux disease)    Heart murmur 12/2019   Hyperlipidemia    Hypertension    Inguinal hernia bilateral, non-recurrent    S/P aortic dissection repair 03/11/2020   supracoronary straight graft repair with resuspension of native aortic valve and open distal anastomosis for intraoperative acute type A aortic dissection    S/P mitral valve repair 03/11/2020   Complex valvuloplasty including artificial Gore-tex neochord placement x 4, suture plication of posterior leaflet and posteromedial commissure and 32 mm Sorin Memo 4D ring annuloplasty   Sleep apnea    Past Surgical History:  Procedure Laterality Date   bilateral inguinal hernia     CARDIAC CATHETERIZATION Bilateral 02/21/2020   CHEST TUBE INSERTION N/A 03/22/2020   Procedure: CHEST TUBE INSERTION OF 28 BLAKE DRAIN.;  Surgeon: Delight Ovens, MD;  Location: [facility] OR;  Service: Thoracic;  Laterality: N/A;   COLONOSCOPY  06/09/2004   [id number] colon diverticulosis, otherwise normal colonoscopy   EYE SURGERY  08/2014   HERNIA REPAIR     MITRAL VALVE REPAIR N/A 03/11/2020   Procedure: MITRAL VALVE REPAIR (MVR) USING MEMO 4D SIZE 32 RING;  Surgeon: Purcell Nails, MD;  Location: [facility] OR;  Service: Open Heart Surgery;  Laterality: N/A;   PALATE / UVULA BIOPSY / EXCISION     PERICARDIAL WINDOW N/A 03/22/2020   Procedure: SUBXYPHOID POST-OP PERICARDIAL FLUID DRAINAGE WITH  CHEST TUBE INSERTION.;  Surgeon: Delight Ovens, MD;  Location: [facility] OR;  Service: Thoracic;  Laterality: N/A;   REPAIR OF ACUTE ASCENDING THORACIC AORTIC DISSECTION N/A 03/11/2020   Procedure: REPAIR OF ACUTE ASCENDING THORACIC AORTIC DISSECTION USING A HEMASHIELD PLATINUM STRAIGHT GRAFT AND A HEASHIELD PLATINUM 28X10MM SINGLE ARM GRAFT;  Surgeon: Purcell Nails, MD;  Location: [facility] OR;  Service: Open Heart Surgery;  Laterality: N/A;   RIGHT/LEFT HEART CATH AND CORONARY ANGIOGRAPHY N/A 02/21/2020   Procedure: RIGHT/LEFT HEART CATH AND CORONARY ANGIOGRAPHY;  Surgeon: Lennette Bihari, MD;  Location: [facility] INVASIVE CV LAB;  Service: Cardiovascular;  Laterality: N/A;   TEE WITHOUT CARDIOVERSION N/A 12/19/2019   Procedure: TRANSESOPHAGEAL ECHOCARDIOGRAM (TEE);  Surgeon: Jodelle Red, MD;  Location: [facility] ENDOSCOPY;  Service: Cardiovascular;  Laterality: N/A;   TEE WITHOUT CARDIOVERSION N/A 03/11/2020   Procedure: TRANSESOPHAGEAL ECHOCARDIOGRAM (TEE);  Surgeon: Purcell Nails, MD;  Location: [facility] OR;  Service: Open Heart Surgery;  Laterality: N/A;   TEE WITHOUT CARDIOVERSION N/A 03/22/2020   Procedure: TRANSESOPHAGEAL ECHOCARDIOGRAM (TEE);  Surgeon: Delight Ovens, MD;  Location: [facility] OR;  Service: Thoracic;  Laterality: N/A;   TONSILLECTOMY     VASECTOMY      Allergies  No Known Allergies  History of Present Illness    Patrick Bell has a PMH of HTN, HLD, asthma, OSA, mitral valve regurgitation status post mitral valve repair by Dr. Cornelius Moras 03/11/2020.  His procedure was complicated by  type a aortic dissection requiring graft repair of his aorta and resuspension of his aortic valve.     He followed up with Corine Shelter, PA-C via telemedicine 1/22.  At that time his blood pressure was well-controlled.  His creatinine was stable as well as his anemia with a hemoglobin of 8.8.  He was seen in follow-up last by Dr. Tresa Endo on 03/09/2022.  He denied exertional chest pain.  He did note 2  episodes of short-lived mild chest sensation while sitting in a chair.  Episodes were nonexertional.  He admitted to mild dizziness.  His blood pressure remained stable.  He underwent follow-up sleep study by Dr. York Spaniel and CPAP pressure of 11 cm was recommended.  He contacted the nurse triage line on 07/25/2022.  He reported that over the previous month he had noted increased shortness of breath with moving around and he denied lower extremity swelling as well as weight gain.  He reported losing around 10 pounds over the course of a month.  His blood pressure was 127/70 with a heart rate of 76.     He presented to the clinic 07/26/22 for follow-up evaluation and stated over the last several weeks he had noticed mild left chest discomfort, dizziness, a.m. nosebleeds, occasional dizziness and 10 pound weight loss.  He had poor appetite.  He noticed that his symptoms started after attending a funeral.  He was also not  as active due to back pain.  We reviewed his echocardiogram 04/15/2021 and aortic dissection.  I  ordered CBC, BMP, echocardiogram for further evaluation and plan follow-up in 2 to 3 months.  His follow-up echocardiogram 09/30/2022 showed normal LV function and intermediate diastolic parameters.  He was noted to have trivial mitral valve regurgitation and his aortic root was noted to be 39 mm.  Plan was made to repeat in 12 months.  He presents to the clinic today for follow-up evaluation and states he has been noticing dizziness.  He brings in a blood pressure log which shows blood pressures in the 120-80s systolic over 70s-60s.  He presented to his PCP and was asked about amlodipine reduction.  PCP recommended following up with cardiology for recommendations.  His hydration has been about 30 ounces daily.  We reviewed the importance of increasing hydration and physical activity.  I will decrease his amlodipine to 5 mg daily, have him increase his p.o. hydration, have him increase his physical  activity as tolerated and plan follow-up in 6 to 9 months.  Today he denies chest pain, shortness of breath, lower extremity edema, palpitations, melena, hematuria, hemoptysis, diaphoresis, weakness, presyncope, syncope, orthopnea, and PND.    Home Medications    Prior to Admission medications   Medication Sig Start Date End Date Taking? Authorizing Provider  acetaminophen (TYLENOL) 325 MG tablet Take 1-2 tablets (325-650 mg total) by mouth every 4 (four) hours as needed for mild pain. Patient taking differently: Take 325-650 mg by mouth every 4 (four) hours as needed for mild pain. Taking 1000mg  bid 04/06/20   Angiulli, Mcarthur Rossetti, PA-C  albuterol (VENTOLIN HFA) 108 (90 Base) MCG/ACT inhaler INHALE 2 PUFFS EVERY 6 HOURS AS NEEDED FOR WHEEZING OR SHORTNESS OF BREATH 08/25/21   Dettinger, Elige Radon, MD  amLODipine (NORVASC) 10 MG tablet Take 1 tablet (10 mg total) by mouth daily. 03/09/22   Lennette Bihari, MD  guaiFENesin (MUCINEX) 600 MG 12 hr tablet Take 600 mg by mouth every 4 (four) hours as needed for cough or to loosen  phlegm.    [provider]  ipratropium (ATROVENT) 0.03 % nasal spray  10/21/21   [provider]  levothyroxine (SYNTHROID) 50 MCG tablet Take 1 tablet (50 mcg total) by mouth daily. 07/07/22   Dettinger, Elige Radon, MD  methocarbamol (ROBAXIN) 500 MG tablet Take 1 tablet (500 mg total) by mouth 4 (four) times daily. 06/24/22   Bennie Pierini, FNP  metoprolol succinate (TOPROL-XL) 50 MG 24 hr tablet TAKE ONE TABLET AT BEDTIME 06/10/22   [facility], Roe Rutherford, PA  montelukast (SINGULAIR) 10 MG tablet Take 1 tablet (10 mg total) by mouth daily. 07/07/22   Dettinger, Elige Radon, MD  Olopatadine HCl 0.2 % SOLN Place 1 drop into both eyes daily as needed (allergies).    [provider]  pantoprazole (PROTONIX) 40 MG tablet TAKE 1 TABLET DAILY 07/07/22   Dettinger, Elige Radon, MD  rosuvastatin (CRESTOR) 20 MG tablet Take 1 tablet (20 mg total) by mouth daily. 03/09/22    Lennette Bihari, MD  traMADol (ULTRAM) 50 MG tablet Take 1 tablet (50 mg total) by mouth every 12 (twelve) hours as needed. 07/07/22   Dettinger, Elige Radon, MD  warfarin (COUMADIN) 5 MG tablet TAKE ONE TABLET ONCE DAILY AT 4PM 07/08/21   Dettinger, Elige Radon, MD    Family History    Family History  Problem Relation Age of Onset   Heart disease Mother    Hip fracture Mother 39   Osteoporosis Mother    Dementia Mother 25   Arthritis Father    Heart disease Father    Arthritis Sister    Arthritis Brother    Colon cancer Neg Hx    He indicated that his mother is deceased. He indicated that his father is deceased. He indicated that his sister is alive. He indicated that his brother is alive. He indicated that the status of his neg hx is unknown.  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  Tobacco Use   Smoking status: Former    Current packs/day: 0.00    Average packs/day: 4.0 packs/day for 25.0 years (100.0 ttl pk-yrs)    Types: Cigarettes    Start date: 06/22/1957    Quit date: 06/23/1982    Years since quitting: 40.4   Smokeless tobacco: Never   Tobacco comments:    pt was a truck Psychiatric nurse   Vaping status: Never Used  Substance and Sexual Activity   Alcohol use: Yes    Alcohol/week: 5.0 standard drinks of alcohol    Types: 5 Shots of liquor per week    Comment: brandy a few times a month, beer once a month   Drug use: No   Sexual activity: Yes    Birth control/protection: Surgical  Other Topics Concern   Not on file  Social History Narrative   Not on file   Social Determinants of Health   Financial Resource Strain: Low Risk  (11/19/2021)   Overall Financial Resource Strain (CARDIA)    Difficulty  of Paying Living Expenses: Not hard at all  Food Insecurity: No Food Insecurity (03/08/2022)   Hunger Vital Sign    Worried About Programme researcher, broadcasting/film/video in  the Last Year: Never true    Ran Out of Food in the Last Year: Never true  Transportation Needs: No Transportation Needs (03/08/2022)   PRAPARE - Administrator, Civil Service (Medical): No    Lack of Transportation (Non-Medical): No  Physical Activity: Sufficiently Active (11/19/2021)   Exercise Vital Sign    Days of Exercise per Week: 5 days    Minutes of Exercise per Session: 40 min  Stress: No Stress Concern Present (11/19/2021)   [employer] of Occupational Health - Occupational Stress Questionnaire    Feeling of Stress : Not at all  Social Connections: Socially Integrated (11/19/2021)   Social Connection and Isolation Panel [NHANES]    Frequency of Communication with Friends and Family: More than three times a week    Frequency of Social Gatherings with Friends and Family: More than three times a week    Attends Religious Services: More than 4 times per year    Active Member of [employer] or Organizations: Yes    Attends Engineer, structural: More than 4 times per year    Marital Status: Married  Catering manager Violence: Not At Risk (03/08/2022)   Humiliation, Afraid, Rape, and Kick questionnaire    Fear of Current or Ex-Partner: No    Emotionally Abused: No    Physically Abused: No    Sexually Abused: No     Review of Systems    General:  No chills, fever, night sweats or weight changes.  Cardiovascular:  No chest pain, dyspnea on exertion, edema, orthopnea, palpitations, paroxysmal nocturnal dyspnea. Dermatological: No rash, lesions/masses Respiratory: No cough, dyspnea Urologic: No hematuria, dysuria Abdominal:   No nausea, vomiting, diarrhea, bright red blood per rectum, melena, or hematemesis Neurologic:  No visual changes, wkns, changes in mental status. All other systems reviewed and are  otherwise negative except as noted above.  Physical Exam    VS:  BP 132/70   Pulse 70   Ht 5\' 8"  (1.727 m)   Wt 181 lb (82.1 kg)   SpO2 97%   BMI 27.52 kg/m  , BMI Body mass index is 27.52 kg/m. GEN: Well nourished, well developed, in no acute distress. HEENT: normal. Neck: Supple, no JVD, carotid bruits, or masses. Cardiac: RRR, 2/6 systolic murmur heard along left sternal border.  Murmurs, rubs, or gallops. No clubbing, cyanosis, generalized right greater than left nonpitting edema.  Radials/DP/PT 2+ and equal bilaterally.  Respiratory:  Respirations regular and unlabored, clear to auscultation bilaterally. GI: Soft, nontender, nondistended, BS + x 4. MS: no deformity or atrophy. Skin: warm and dry, no rash. Neuro:  Strength and sensation are intact. Psych: Normal affect.  Accessory Clinical Findings    Recent Labs: 08/22/2022: Hemoglobin 16.2; Platelets 190 11/23/2022: ALT 15; BUN 24; Creatinine, Ser 1.06; Potassium 3.9; Sodium 147; TSH 0.673   Recent Lipid Panel    Component Value Date/Time   CHOL 157 11/23/2022 0952   CHOL 168 06/18/2012 1031   TRIG 53 11/23/2022 0952   TRIG 101 01/14/2013 1218   TRIG 102 06/18/2012 1031   HDL 75 11/23/2022 0952   HDL 65 01/14/2013 1218   HDL 62 06/18/2012 1031   CHOLHDL 2.1 11/23/2022 0952   LDLCALC 71 11/23/2022 0952   LDLCALC 99 01/14/2013 1218   LDLCALC 86 06/18/2012 1031         ECG personally reviewed by me today-none today.  EKG 07/26/2022 normal sinus rhythm with occasional premature ventricular complexes 77 bpm- No acute changes  CT chest/abdomen/pelvis 03/17/2022  CTA CHEST FINDINGS   Cardiovascular: Repair of the ascending thoracic aortic dissection with a graft. The graft is widely patent. Again noted is a dissection involving the aortic arch and extending into the descending thoracic aorta. Aortic arch on sequence 6, image 50 measures approximately 4.5 cm and previously measured 4.4 cm. Great vessels  remain patent. Again noted is a dissection extending into the left subclavian artery. Left vertebral artery is patent. Left subclavian artery dissection does not extend into the left axillary artery. Proximal common carotid arteries are patent bilaterally. Evidence for small right vertebral artery. Proximal descending thoracic aorta measures 4.3 cm and stable. Mid descending thoracic aorta at the level of the main pulmonary artery measures 3.4 cm and stable. Distal descending thoracic aorta measures 2.6 cm and stable. Aortic dissection extends into the abdominal aorta. Stable appearance of the heart with previous mitral valve repair.   Mediastinum/Nodes: No lymph node enlargement in the mediastinum or hila. Thyroid tissue is unremarkable. No axillary lymph node enlargement. Esophagus is unremarkable.   Lungs/Pleura: Trachea and mainstem bronchi are patent. Stable elongated nodular density in the right middle lobe on sequence 8, image 116 measures up to 7 mm. Stable tiny nodular density in the right upper lobe on sequence 8, image 25. No airspace disease or consolidation in the lungs. Stable branching densities in the periphery of the left upper lobe on sequence 8, image 64. No large pleural effusions. Stable subtle nodular density in the left upper lobe on sequence 8, image 28.   Musculoskeletal: No acute bone abnormality.   Review of the MIP images confirms the above findings.   CTA ABDOMEN AND PELVIS FINDINGS   VASCULAR   Aorta: Aortic dissection involving the entire abdominal aorta that extends into the iliac arteries bilaterally. Configuration of the abdominal aorta and dissection has not significantly changed. Supra celiac abdominal aorta measures 2.9 cm and stable. True lumen is along the left side of the proximal abdominal aorta.   Celiac: Celiac trunk originates from the true lumen. Celiac trunk is patent without aneurysm or dissection. No significant stenosis.   SMA:  Dissection involving the origin the SMA but the flow appears to be predominantly from the true lumen. No significant stenosis or aneurysm involving the SMA.   Renals: Dissection involves the proximal right renal artery. Dissection most likely involves the proximal left renal artery. This configuration is unchanged. Bilateral renal arteries are patent without significant stenosis or aneurysm.   IMA: Originates from the false lumen and patent without aneurysm or significant stenosis.   Inflow: Dissection extends into the common iliac arteries bilaterally. Dissection extends into the right external iliac artery and terminates in the mid/distal aspect and stable from the previous examination. Left iliac dissection extends into the distal left external iliac artery and terminates in the proximal left common femoral artery region. Configuration and extent of the left iliac artery dissection has not significantly changed. Bilateral internal iliac arteries are patent and originating from the true lumens. Proximal femoral arteries are patent bilaterally.   Veins: No obvious venous abnormality within the limitations of this arterial phase study.   Review of the MIP images confirms the above findings.   NON-VASCULAR   Hepatobiliary: Subtle hypodensity in the posterior right hepatic lobe on sequence 11, image 72 is nonspecific and likely an incidental finding. No biliary dilatation. Portal venous system is patent. Normal appearance of the gallbladder.   Pancreas: Unremarkable. No pancreatic ductal dilatation or surrounding inflammatory changes.  Spleen: Normal in size without focal abnormality.   Adrenals/Urinary Tract: Normal adrenal glands. Stable appearance of both kidneys without hydronephrosis. Probable left renal sinus cyst which is similar to the previous examination. Evidence for small cortical renal cysts. These cysts do not require dedicated follow-up. No suspicious renal  lesions. Normal appearance of the urinary bladder.   Stomach/Bowel: Normal appearance of the stomach and small bowel. Colonic diverticulosis particularly in the sigmoid colon. Chronic densities along the origin of the left inguinal canal related to previous hernia repair. No evidence for acute colonic or bowel inflammation. Normal appendix without inflammatory changes. There is small bowel near the origin of the right inguinal canal.   Lymphatic: No lymph node enlargement in the abdomen or pelvis.   Reproductive: Prostate is upper limits of normal and stable the previous examination.   Other: Negative for free fluid. Negative for free air. Evidence for bilateral inguinal hernia repairs.   Musculoskeletal: Multilevel degenerative disc and facet disease in lumbar spine. No acute bone abnormality.   Review of the MIP images confirms the above findings.   IMPRESSION: 1. Stable appearance of the residual dissection involving the aortic arch, descending thoracic aorta, abdominal aorta and bilateral iliac arteries. No significant change in the size of the thoracic or abdominal aorta. Stable appearance of the surgically repaired ascending thoracic aorta. 2. No acute abnormality involving the chest, abdomen or pelvis. 3. Colonic diverticulosis without acute inflammation.     Electronically Signed   By: Richarda Overlie M.D.   On: 03/18/2022 10:52  Echocardiogram 04/15/2021 1. Left ventricular ejection fraction, by estimation, is 60 to 65%. Left  ventricular ejection fraction by 3D volume is 63 %. The left ventricle has  normal function. The left ventricle has no regional wall motion  abnormalities. There is mild asymmetric  left ventricular hypertrophy. Left ventricular diastolic parameters are  consistent with Grade I diastolic dysfunction (impaired relaxation).   2. Right ventricular systolic function is moderately reduced. The right  ventricular size is normal. There is normal  pulmonary artery systolic  pressure.   3. Left atrial size was moderately dilated.   4. Right atrial size was mildly dilated.   5. The mitral valve has been repaired/replaced. Mild mitral valve  regurgitation.   6. The aortic valve is grossly normal. Aortic valve regurgitation is  mild.   Echocardiogram 09/30/2022  IMPRESSIONS     1. Left ventricular ejection fraction, by estimation, is 55%. The left  ventricle has normal function. The left ventricle has no regional wall  motion abnormalities. Left ventricular diastolic parameters are  indeterminate.   2. Right ventricular systolic function is mildly reduced. The right  ventricular size is normal.   3. Left atrial size was moderately dilated.   4. The mitral valve has been repaired/replaced. Trivial mitral valve  regurgitation. No evidence of mitral stenosis. The mean mitral valve  gradient is 3.3 mmHg.   5. The aortic valve is tricuspid. There is mild calcification of the  aortic valve. Aortic valve regurgitation is mild. Aortic valve  sclerosis/calcification is present, without any evidence of aortic  stenosis.   6. Aortic dilatation noted. There is borderline dilatation of the aortic  root, measuring 39 mm.   7. The inferior vena cava is normal in size with greater than 50%  respiratory variability, suggesting right atrial pressure of 3 mmHg.   FINDINGS   Left Ventricle: Left ventricular ejection fraction, by estimation, is  55%. The left ventricle has normal function. The left ventricle  has no  regional wall motion abnormalities. The left ventricular internal cavity  size was normal in size. There is no  left ventricular hypertrophy. Abnormal (paradoxical) septal motion  consistent with post-operative status. Left ventricular diastolic  parameters are indeterminate.   Right Ventricle: The right ventricular size is normal. No increase in  right ventricular wall thickness. Right ventricular systolic function is  mildly  reduced.   Left Atrium: Left atrial size was moderately dilated.   Right Atrium: Right atrial size was normal in size.   Pericardium: There is no evidence of pericardial effusion.   Mitral Valve: The mitral valve has been repaired/replaced. Trivial mitral  valve regurgitation. There is a prosthetic annuloplasty ring present in  the mitral position. No evidence of mitral valve stenosis. MV peak  gradient, 7.3 mmHg. The mean mitral valve   gradient is 3.3 mmHg.   Tricuspid Valve: The tricuspid valve is normal in structure. Tricuspid  valve regurgitation is mild . No evidence of tricuspid stenosis.   Aortic Valve: The aortic valve is tricuspid. There is mild calcification  of the aortic valve. Aortic valve regurgitation is mild. Aortic valve  sclerosis/calcification is present, without any evidence of aortic  stenosis. Aortic valve mean gradient  measures 2.0 mmHg. Aortic valve peak gradient measures 4.0 mmHg. Aortic  valve area, by VTI measures 3.63 cm.   Pulmonic Valve: The pulmonic valve was normal in structure. Pulmonic valve  regurgitation is mild. No evidence of pulmonic stenosis.   Aorta: Aortic dilatation noted. There is borderline dilatation of the  aortic root, measuring 39 mm.   Venous: The inferior vena cava is normal in size with greater than 50%  respiratory variability, suggesting right atrial pressure of 3 mmHg.   IAS/Shunts: No atrial level shunt detected by color flow Doppler.    Assessment & Plan   1.  Dizziness-has noted over the last several weeks that his dizziness is getting worse.  He presented to his PCP.  PCP recommended decreasing amlodipine but asked for follow-up visit with cardiology.  He has been drinking about 30 ounces daily.  Denies palpitations. Increase p.o. hydration Decrease amlodipine to 5 mg daily Continue to monitor  Shortness of breath/DOE-has improved.  Echocardiogram reassuring.  Details above.  Heart rate today 70 bpm.  April and  May lab work reassuring.  Paroxysmal atrial fibrillation-heart rate today 70.  Compliant with Coumadin.  No bleeding issues.Denies recent falls trauma or injury. Continue Coumadin, metoprolol Avoid triggers caffeine, chocolate, EtOH, dehydration etc.  Essential hypertension-BP today 132/70.  Has been monitoring blood pressure at home due to dizziness.  He reports that he has not been doing well with his hydration.  Blood pressures at home have been in the 120s-80s over 50s-70s. Continue metoprolol Decrease amlodipine to 5 mg daily Low-sodium diet Increase physical activity as tolerated Increase p.o. hydration  Aortic dissection-status post graft repair.  Denies chest and back pain. CTA chest/abdomen/pelvis showed widely patent graft/stable dissection with no significant changes.  Repeat echocardiogram showed borderline aortic root measuring 39 mm. Maintain good blood pressure control Repeat chest abdomen pelvis CT 2025 Follows with Dr. Melrose Nakayama  Mitral valve prolapse-status post mitral valve repair.  Echocardiogram 04/15/2021 showed mild mitral valve regurgitation. Repeat reassuring.  Trivial mitral valve regurgitation valve replaced valve noted.  Disposition: Follow-up with Dr. Tresa Endo or me in 6-9 months.   Thomasene Ripple. Tallula Grindle NP-C     11/24/2022, 1:45 PM [facility] [address] Office [phone] Fax [phone])  [phone]    I spent 14 minutes examining this patient, reviewing medications, and using patient centered shared decision making involving her cardiac care.  Prior to her visit I spent greater than 20 minutes reviewing her past medical history,  medications, and prior cardiac tests.

## 2022-11-23 ENCOUNTER — Encounter: Payer: Self-pay | Admitting: Family Medicine

## 2022-11-23 ENCOUNTER — Ambulatory Visit (INDEPENDENT_AMBULATORY_CARE_PROVIDER_SITE_OTHER): Payer: Medicare Other | Admitting: Family Medicine

## 2022-11-23 VITALS — BP 127/64 | HR 73 | Ht 70.0 in | Wt 181.0 lb

## 2022-11-23 DIAGNOSIS — M5136 Other intervertebral disc degeneration, lumbar region: Secondary | ICD-10-CM

## 2022-11-23 DIAGNOSIS — I10 Essential (primary) hypertension: Secondary | ICD-10-CM | POA: Diagnosis not present

## 2022-11-23 DIAGNOSIS — E7800 Pure hypercholesterolemia, unspecified: Secondary | ICD-10-CM

## 2022-11-23 DIAGNOSIS — E039 Hypothyroidism, unspecified: Secondary | ICD-10-CM | POA: Diagnosis not present

## 2022-11-23 DIAGNOSIS — Z79899 Other long term (current) drug therapy: Secondary | ICD-10-CM | POA: Diagnosis not present

## 2022-11-23 NOTE — Progress Notes (Signed)
BP 127/64   Pulse 73   Ht 5\' 10"  (1.778 m)   Wt 181 lb (82.1 kg)   SpO2 96%   BMI 25.97 kg/m    Subjective:   Patient ID: Patrick Bell, male    DOB: 1940-05-30, 82 y.o.   MRN: [id number]  HPI: Patrick Bell is a 82 y.o. male presenting on 11/23/2022 for Medical Management of Chronic Issues, Hypothyroidism, Osteoarthritis, and Fatigue   HPI Degenerative disc disease lumbar recheck Pain assessment: Cause of pain-degenerative disc disease lumbar Pain location-lower back and down into legs Pain on scale of 1-10- 6 Frequency-daily but mostly uses Tylenol, only occasionally uses oxycodone.  Once or twice in the past month. What increases pain-overdoing it or overworking. What makes pain Better-Tylenol and oxycodone Effects on ADL -some weakness in legs from back issues but for the most part does well Any change in general medical condition-none  Current opioids rx-Percocet 5-3 25 1  to 2 tablets every 6 hours as needed # meds rx-120 Effectiveness of current meds-works well Adverse reactions from pain meds-none Morphine equivalent- 30-60  Pill count performed-No Last drug screen -N/A ( high risk q6m, moderate risk q82m, low risk yearly ) Urine drug screen today- Yes Was the [facility] reviewed-yes  If yes were their any concerning findings? - none  Pain contract signed on: n/a  Hypertension Patient is currently on amlodipine and metoprolol, and their blood pressure today is 127/64 but at home he has been getting multiple readings down in the 80s over 60s and 80s over 50s.  He has been feeling more fatigued and decreased energy.. Patient denies any lightheadedness or dizziness. Patient denies headaches, blurred vision, chest pains, shortness of breath, or weakness. Denies any side effects from medication and is content with current medication.   Hyperlipidemia Patient is coming in for recheck of his hyperlipidemia. The patient is currently taking Crestor. They deny any issues  with myalgias or history of liver damage from it. They deny any focal numbness or weakness or chest pain.   Hypothyroidism recheck Patient is coming in for thyroid recheck today as well. They deny any issues with hair changes or heat or cold problems or diarrhea or constipation. They deny any chest pain or palpitations. They are currently on levothyroxine 50 micrograms   Relevant past medical, surgical, family and social history reviewed and updated as indicated. Interim medical history since our last visit reviewed. Allergies and medications reviewed and updated.  Review of Systems  Constitutional:  Positive for fatigue. Negative for chills and fever.  Eyes:  Negative for visual disturbance.  Respiratory:  Negative for shortness of breath and wheezing.   Cardiovascular:  Negative for chest pain and leg swelling.  Musculoskeletal:  Positive for arthralgias and back pain. Negative for gait problem.  Skin:  Negative for rash.  Neurological:  Positive for weakness. Negative for dizziness and light-headedness.  All other systems reviewed and are negative.   Per HPI unless specifically indicated above   Allergies as of 11/23/2022   No Known Allergies      Medication List        Accurate as of November 23, 2022  9:48 AM. If you have any questions, ask your nurse or doctor.          STOP taking these medications    predniSONE 10 MG tablet Commonly known as: DELTASONE Stopped by: Elige Radon Jakob Kimberlin       TAKE these medications    acetaminophen 325 MG tablet  Commonly known as: TYLENOL Take 1-2 tablets (325-650 mg total) by mouth every 4 (four) hours as needed for mild pain. What changed:  how much to take when to take this additional instructions   albuterol 108 (90 Base) MCG/ACT inhaler Commonly known as: VENTOLIN HFA INHALE 2 PUFFS EVERY 6 HOURS AS NEEDED FOR WHEEZING OR SHORTNESS OF BREATH   amLODipine 10 MG tablet Commonly known as: NORVASC Take 1 tablet (10 mg  total) by mouth daily.   guaiFENesin 600 MG 12 hr tablet Commonly known as: MUCINEX Take 600 mg by mouth every 4 (four) hours as needed for cough or to loosen phlegm.   levothyroxine 50 MCG tablet Commonly known as: SYNTHROID Take 1 tablet (50 mcg total) by mouth daily.   metoprolol succinate 50 MG 24 hr tablet Commonly known as: TOPROL-XL TAKE ONE TABLET AT BEDTIME   montelukast 10 MG tablet Commonly known as: SINGULAIR Take 1 tablet (10 mg total) by mouth daily.   Olopatadine HCl 0.2 % Soln Place 1 drop into both eyes daily as needed (allergies).   oxyCODONE-acetaminophen 5-325 MG tablet Commonly known as: Percocet Take 1-2 tablets by mouth every 6 (six) hours as needed.   pantoprazole 40 MG tablet Commonly known as: PROTONIX TAKE 1 TABLET DAILY   rosuvastatin 20 MG tablet Commonly known as: CRESTOR Take 1 tablet (20 mg total) by mouth daily.   warfarin 5 MG tablet Commonly known as: COUMADIN Take as directed by the anticoagulation clinic. If you are unsure how to take this medication, talk to your nurse or doctor. Original instructions: TAKE ONE TABLET ONCE DAILY AT 4PM         Objective:   BP 127/64   Pulse 73   Ht 5\' 10"  (1.778 m)   Wt 181 lb (82.1 kg)   SpO2 96%   BMI 25.97 kg/m   Wt Readings from Last 3 Encounters:  11/23/22 181 lb (82.1 kg)  09/12/22 171 lb (77.6 kg)  09/05/22 177 lb 12.8 oz (80.6 kg)    Physical Exam Vitals and nursing note reviewed.  Constitutional:      General: He is not in acute distress.    Appearance: He is well-developed. He is not diaphoretic.  Eyes:     General: No scleral icterus.    Conjunctiva/sclera: Conjunctivae normal.  Neck:     Thyroid: No thyromegaly.  Cardiovascular:     Rate and Rhythm: Normal rate and regular rhythm.     Heart sounds: Normal heart sounds. No murmur heard. Pulmonary:     Effort: Pulmonary effort is normal. No respiratory distress.     Breath sounds: Normal breath sounds. No wheezing.   Musculoskeletal:        General: No swelling.     Cervical back: Neck supple.  Lymphadenopathy:     Cervical: No cervical adenopathy.  Skin:    General: Skin is warm and dry.  Neurological:     Mental Status: He is alert and oriented to person, place, and time.     Coordination: Coordination normal.  Psychiatric:        Behavior: Behavior normal.       Assessment & Plan:   Problem List Items Addressed This Visit       Cardiovascular and Mediastinum   Essential hypertension, benign - Primary   Relevant Orders   TSH   Lipid panel   CMP14+EGFR     Endocrine   Hypothyroidism   Relevant Orders   TSH  Other   Hyperlipidemia   Relevant Orders   TSH   Lipid panel   CMP14+EGFR   Other Visit Diagnoses     Controlled substance agreement signed       Relevant Orders   ToxASSURE Select 13 (MW), Urine   Degenerative disc disease, lumbar           Continue current medicine, he says he is only infrequently using the pain medicine that does not take it every day and does not need refill, he says he still has three quarters of the bottle left.  Blood pressure on the lower side and feeling fatigued, recommended that he cut the amlodipine in half and only take 5 mg but he is seeing cardiology tomorrow so he is going to discuss it with them first.  No other changes, see blood work results in the next few days. Follow up plan: Return in about 4 months (around 03/25/2023), or if symptoms worsen or fail to improve, for Degenerative disc disease and hypertension and cholesterol and thyroid.  Counseling provided for all of the vaccine components Orders Placed This Encounter  Procedures   ToxASSURE Select 13 (MW), Urine   TSH   Lipid panel   CMP14+EGFR    Arville Care, MD [facility] 11/23/2022, 9:48 AM

## 2022-11-24 ENCOUNTER — Ambulatory Visit: Payer: Medicare Other | Attending: General Practice | Admitting: General Practice

## 2022-11-24 ENCOUNTER — Ambulatory Visit: Payer: Medicare Other

## 2022-11-24 ENCOUNTER — Encounter: Payer: Self-pay | Admitting: General Practice

## 2022-11-24 VITALS — BP 132/70 | HR 70 | Ht 68.0 in | Wt 181.0 lb

## 2022-11-24 DIAGNOSIS — I10 Essential (primary) hypertension: Secondary | ICD-10-CM | POA: Diagnosis not present

## 2022-11-24 DIAGNOSIS — R0602 Shortness of breath: Secondary | ICD-10-CM | POA: Diagnosis not present

## 2022-11-24 DIAGNOSIS — I480 Paroxysmal atrial fibrillation: Secondary | ICD-10-CM | POA: Diagnosis not present

## 2022-11-24 DIAGNOSIS — I71019 Dissection of thoracic aorta, unspecified: Secondary | ICD-10-CM

## 2022-11-24 DIAGNOSIS — Z9889 Other specified postprocedural states: Secondary | ICD-10-CM

## 2022-11-24 LAB — CMP14+EGFR
ALT: 15 IU/L (ref 0–44)
AST: 29 IU/L (ref 0–40)
Albumin: 4.5 g/dL (ref 3.7–4.7)
Alkaline Phosphatase: 61 IU/L (ref 44–121)
BUN/Creatinine Ratio: 23 (ref 10–24)
BUN: 24 mg/dL (ref 8–27)
Bilirubin Total: 0.5 mg/dL (ref 0.0–1.2)
CO2: 20 mmol/L (ref 20–29)
Calcium: 9.5 mg/dL (ref 8.6–10.2)
Chloride: 109 mmol/L — ABNORMAL HIGH (ref 96–106)
Creatinine, Ser: 1.06 mg/dL (ref 0.76–1.27)
Globulin, Total: 1.9 g/dL (ref 1.5–4.5)
Glucose: 82 mg/dL (ref 70–99)
Potassium: 3.9 mmol/L (ref 3.5–5.2)
Sodium: 147 mmol/L — ABNORMAL HIGH (ref 134–144)
Total Protein: 6.4 g/dL (ref 6.0–8.5)
eGFR: 70 mL/min/{1.73_m2} (ref >59)

## 2022-11-24 LAB — LIPID PANEL
Chol/HDL Ratio: 2.1 ratio (ref 0.0–5.0)
Cholesterol, Total: 157 mg/dL (ref 100–199)
HDL: 75 mg/dL (ref >39)
LDL Chol Calc (NIH): 71 mg/dL (ref 0–99)
Triglycerides: 53 mg/dL (ref 0–149)
VLDL Cholesterol Cal: 11 mg/dL (ref 5–40)

## 2022-11-24 LAB — POCT INR: INR: 3 (ref 2.0–3.0)

## 2022-11-24 LAB — TSH: TSH: 0.673 u[IU]/mL (ref 0.450–4.500)

## 2022-11-24 MED ORDER — AMLODIPINE BESYLATE 5 MG PO TABS: 5.0000 mg | ORAL_TABLET | Freq: Every day | ORAL | 9 refills | Status: DC

## 2022-11-24 NOTE — Patient Instructions (Signed)
Medication Instructions:  DECREASE AMLODIPINE 5MG  DAILY *If you need a refill on your cardiac medications before your next appointment, please call your pharmacy*  Lab Work: NONE If you have labs (blood work) drawn today and your tests are completely normal, you will receive your results only by: MyChart Message (if you have MyChart) OR A paper copy in the mail If you have any lab test that is abnormal or we need to change your treatment, we will call you to review the results.  Other Instructions CONTINUE BLOOD PRESSURE LOG CONTINUE CURRENT DIET INCREASE PHYSICAL ACTIVITY AS TOLERATED  Follow-Up: At [facility], you and your health needs are our priority.  As part of our continuing mission to provide you with exceptional heart care, we have created designated Provider Care Teams.  These Care Teams include your primary Cardiologist (physician) and Advanced Practice Providers (APPs -  Physician Assistants and Nurse Practitioners) who all work together to provide you with the care you need, when you need it.  Your next appointment:   6-9 month(s)  Provider:   Nicki Guadalajara, MD  or Edd Fabian, FNP

## 2022-11-24 NOTE — Patient Instructions (Signed)
Description   Continue on same dosage of Warfarin 1/2 tablet daily except for 1 tablet on Mondays, Wednesdays, and Fridays. Recheck INR in 6 weeks per pt request, seeing Edd Fabian, NP same day. [phone]

## 2022-11-26 LAB — TOXASSURE SELECT 13 (MW), URINE

## 2022-12-15 ENCOUNTER — Encounter (HOSPITAL_BASED_OUTPATIENT_CLINIC_OR_DEPARTMENT_OTHER): Payer: Self-pay | Admitting: Physical Therapy

## 2022-12-15 ENCOUNTER — Ambulatory Visit (HOSPITAL_BASED_OUTPATIENT_CLINIC_OR_DEPARTMENT_OTHER): Payer: Medicare Other | Attending: Anesthesiology | Admitting: Physical Therapy

## 2022-12-15 ENCOUNTER — Other Ambulatory Visit: Payer: Self-pay

## 2022-12-15 DIAGNOSIS — R293 Abnormal posture: Secondary | ICD-10-CM | POA: Insufficient documentation

## 2022-12-15 DIAGNOSIS — M5451 Vertebrogenic low back pain: Secondary | ICD-10-CM | POA: Diagnosis not present

## 2022-12-15 DIAGNOSIS — M6281 Muscle weakness (generalized): Secondary | ICD-10-CM | POA: Insufficient documentation

## 2022-12-15 DIAGNOSIS — R2681 Unsteadiness on feet: Secondary | ICD-10-CM | POA: Insufficient documentation

## 2022-12-15 DIAGNOSIS — M5459 Other low back pain: Secondary | ICD-10-CM
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
## 2022-12-16 ENCOUNTER — Ambulatory Visit (HOSPITAL_BASED_OUTPATIENT_CLINIC_OR_DEPARTMENT_OTHER): Payer: Medicare Other

## 2022-12-16 ENCOUNTER — Encounter (HOSPITAL_BASED_OUTPATIENT_CLINIC_OR_DEPARTMENT_OTHER): Payer: Self-pay

## 2022-12-16 DIAGNOSIS — M6281 Muscle weakness (generalized): Secondary | ICD-10-CM | POA: Diagnosis not present

## 2022-12-16 DIAGNOSIS — R293 Abnormal posture: Secondary | ICD-10-CM | POA: Diagnosis not present

## 2022-12-16 DIAGNOSIS — M5451 Vertebrogenic low back pain: Secondary | ICD-10-CM | POA: Diagnosis not present

## 2022-12-16 DIAGNOSIS — R2681 Unsteadiness on feet: Secondary | ICD-10-CM | POA: Diagnosis not present

## 2022-12-16 DIAGNOSIS — M5459 Other low back pain: Secondary | ICD-10-CM
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
## 2022-12-19 ENCOUNTER — Ambulatory Visit (HOSPITAL_BASED_OUTPATIENT_CLINIC_OR_DEPARTMENT_OTHER): Payer: Medicare Other | Admitting: Physical Therapy

## 2022-12-19 ENCOUNTER — Encounter (HOSPITAL_BASED_OUTPATIENT_CLINIC_OR_DEPARTMENT_OTHER): Payer: Self-pay | Admitting: Physical Therapy

## 2022-12-19 DIAGNOSIS — R293 Abnormal posture: Secondary | ICD-10-CM

## 2022-12-19 DIAGNOSIS — R2681 Unsteadiness on feet: Secondary | ICD-10-CM

## 2022-12-19 DIAGNOSIS — M6281 Muscle weakness (generalized): Secondary | ICD-10-CM

## 2022-12-19 DIAGNOSIS — M5459 Other low back pain: Secondary | ICD-10-CM

## 2022-12-19 DIAGNOSIS — M5451 Vertebrogenic low back pain: Secondary | ICD-10-CM | POA: Diagnosis not present
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
## 2022-12-21 ENCOUNTER — Ambulatory Visit (HOSPITAL_BASED_OUTPATIENT_CLINIC_OR_DEPARTMENT_OTHER): Payer: Medicare Other | Admitting: Physical Therapy

## 2022-12-21 ENCOUNTER — Encounter (HOSPITAL_BASED_OUTPATIENT_CLINIC_OR_DEPARTMENT_OTHER): Payer: Self-pay | Admitting: Physical Therapy

## 2022-12-21 DIAGNOSIS — M6281 Muscle weakness (generalized): Secondary | ICD-10-CM

## 2022-12-21 DIAGNOSIS — R293 Abnormal posture: Secondary | ICD-10-CM | POA: Diagnosis not present

## 2022-12-21 DIAGNOSIS — R2681 Unsteadiness on feet: Secondary | ICD-10-CM

## 2022-12-21 DIAGNOSIS — M5451 Vertebrogenic low back pain: Secondary | ICD-10-CM | POA: Diagnosis not present

## 2022-12-21 DIAGNOSIS — M5459 Other low back pain: Secondary | ICD-10-CM
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
## 2022-12-27 ENCOUNTER — Ambulatory Visit (HOSPITAL_BASED_OUTPATIENT_CLINIC_OR_DEPARTMENT_OTHER): Payer: Self-pay | Admitting: Physical Therapy

## 2022-12-28 ENCOUNTER — Encounter (HOSPITAL_BASED_OUTPATIENT_CLINIC_OR_DEPARTMENT_OTHER): Payer: Self-pay | Admitting: Physical Therapy

## 2022-12-28 ENCOUNTER — Ambulatory Visit (HOSPITAL_BASED_OUTPATIENT_CLINIC_OR_DEPARTMENT_OTHER): Payer: Medicare Other | Admitting: Physical Therapy

## 2022-12-28 DIAGNOSIS — M6281 Muscle weakness (generalized): Secondary | ICD-10-CM | POA: Diagnosis not present

## 2022-12-28 DIAGNOSIS — M5451 Vertebrogenic low back pain: Secondary | ICD-10-CM | POA: Diagnosis not present

## 2022-12-28 DIAGNOSIS — R2681 Unsteadiness on feet: Secondary | ICD-10-CM | POA: Diagnosis not present

## 2022-12-28 DIAGNOSIS — M5459 Other low back pain: Secondary | ICD-10-CM

## 2022-12-28 DIAGNOSIS — R293 Abnormal posture: Secondary | ICD-10-CM | POA: Diagnosis not present
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
## 2023-01-04 ENCOUNTER — Ambulatory Visit (HOSPITAL_BASED_OUTPATIENT_CLINIC_OR_DEPARTMENT_OTHER): Payer: Medicare Other | Attending: Anesthesiology | Admitting: Physical Therapy

## 2023-01-04 ENCOUNTER — Encounter (HOSPITAL_BASED_OUTPATIENT_CLINIC_OR_DEPARTMENT_OTHER): Payer: Self-pay | Admitting: Physical Therapy

## 2023-01-04 DIAGNOSIS — M25561 Pain in right knee: Secondary | ICD-10-CM | POA: Diagnosis not present

## 2023-01-04 DIAGNOSIS — R2681 Unsteadiness on feet: Secondary | ICD-10-CM | POA: Insufficient documentation

## 2023-01-04 DIAGNOSIS — M5459 Other low back pain: Secondary | ICD-10-CM | POA: Diagnosis not present

## 2023-01-04 DIAGNOSIS — R293 Abnormal posture: Secondary | ICD-10-CM | POA: Diagnosis not present

## 2023-01-04 DIAGNOSIS — M6281 Muscle weakness (generalized): Secondary | ICD-10-CM | POA: Insufficient documentation

## 2023-01-04 DIAGNOSIS — R262 Difficulty in walking, not elsewhere classified: Secondary | ICD-10-CM | POA: Diagnosis not present

## 2023-01-04 DIAGNOSIS — G8929 Other chronic pain: Secondary | ICD-10-CM | POA: Diagnosis not present
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
## 2023-01-05 ENCOUNTER — Ambulatory Visit: Payer: Medicare Other | Attending: Internal Medicine | Admitting: *Deleted

## 2023-01-05 DIAGNOSIS — Z9889 Other specified postprocedural states: Secondary | ICD-10-CM | POA: Diagnosis not present

## 2023-01-05 DIAGNOSIS — Z5181 Encounter for therapeutic drug level monitoring: Secondary | ICD-10-CM | POA: Diagnosis not present

## 2023-01-05 DIAGNOSIS — I71019 Dissection of thoracic aorta, unspecified: Secondary | ICD-10-CM

## 2023-01-05 LAB — POCT INR: INR: 2.9 (ref 2.0–3.0)

## 2023-01-05 NOTE — Patient Instructions (Addendum)
Description   Continue taking Warfarin 1/2 tablet daily except for 1 tablet on Mondays, Wednesdays, and Fridays. Recheck INR in 6 weeks. [phone]

## 2023-01-06 ENCOUNTER — Ambulatory Visit (HOSPITAL_BASED_OUTPATIENT_CLINIC_OR_DEPARTMENT_OTHER): Payer: Medicare Other | Admitting: Physical Therapy

## 2023-01-06 ENCOUNTER — Encounter (HOSPITAL_BASED_OUTPATIENT_CLINIC_OR_DEPARTMENT_OTHER): Payer: Self-pay | Admitting: Physical Therapy

## 2023-01-06 ENCOUNTER — Ambulatory Visit: Payer: Medicare Other | Admitting: Family Medicine

## 2023-01-06 DIAGNOSIS — R293 Abnormal posture: Secondary | ICD-10-CM

## 2023-01-06 DIAGNOSIS — R2681 Unsteadiness on feet: Secondary | ICD-10-CM | POA: Diagnosis not present

## 2023-01-06 DIAGNOSIS — M6281 Muscle weakness (generalized): Secondary | ICD-10-CM

## 2023-01-06 DIAGNOSIS — M5459 Other low back pain: Secondary | ICD-10-CM

## 2023-01-06 DIAGNOSIS — G8929 Other chronic pain: Secondary | ICD-10-CM | POA: Diagnosis not present

## 2023-01-06 DIAGNOSIS — R262 Difficulty in walking, not elsewhere classified: Secondary | ICD-10-CM | POA: Diagnosis not present

## 2023-01-06 DIAGNOSIS — M25561 Pain in right knee: Secondary | ICD-10-CM | POA: Diagnosis not present
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
## 2023-01-09 ENCOUNTER — Ambulatory Visit (HOSPITAL_BASED_OUTPATIENT_CLINIC_OR_DEPARTMENT_OTHER): Payer: Medicare Other | Admitting: Physical Therapy

## 2023-01-09 ENCOUNTER — Encounter (HOSPITAL_BASED_OUTPATIENT_CLINIC_OR_DEPARTMENT_OTHER): Payer: Self-pay | Admitting: Physical Therapy

## 2023-01-09 DIAGNOSIS — G8929 Other chronic pain: Secondary | ICD-10-CM | POA: Diagnosis not present

## 2023-01-09 DIAGNOSIS — R2681 Unsteadiness on feet: Secondary | ICD-10-CM

## 2023-01-09 DIAGNOSIS — M6281 Muscle weakness (generalized): Secondary | ICD-10-CM | POA: Diagnosis not present

## 2023-01-09 DIAGNOSIS — R262 Difficulty in walking, not elsewhere classified: Secondary | ICD-10-CM | POA: Diagnosis not present

## 2023-01-09 DIAGNOSIS — R293 Abnormal posture: Secondary | ICD-10-CM | POA: Diagnosis not present

## 2023-01-09 DIAGNOSIS — M5459 Other low back pain: Secondary | ICD-10-CM | POA: Diagnosis not present

## 2023-01-09 DIAGNOSIS — M25561 Pain in right knee: Secondary | ICD-10-CM | POA: Diagnosis not present
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
## 2023-01-11 ENCOUNTER — Ambulatory Visit: Payer: Medicare Other

## 2023-01-11 ENCOUNTER — Ambulatory Visit (HOSPITAL_BASED_OUTPATIENT_CLINIC_OR_DEPARTMENT_OTHER): Payer: Medicare Other | Admitting: Physical Therapy

## 2023-01-11 VITALS — Ht 70.0 in | Wt 181.0 lb

## 2023-01-11 DIAGNOSIS — Z0000 Encounter for general adult medical examination without abnormal findings: Secondary | ICD-10-CM | POA: Diagnosis not present

## 2023-01-11 NOTE — Progress Notes (Signed)
Subjective:   Patrick Bell is a 82 y.o. male who presents for Medicare Annual/Subsequent preventive examination.  Visit Complete: Virtual I connected with  Posey Pronto on 01/11/23 by a audio enabled telemedicine application and verified that I am speaking with the correct person using two identifiers.  Patient Location: Home  Provider Location: Home Office  I discussed the limitations of evaluation and management by telemedicine. The patient expressed understanding and agreed to proceed.  Vital Signs: Because this visit was a virtual/telehealth visit, some criteria may be missing or patient reported. Any vitals not documented were not able to be obtained and vitals that have been documented are patient reported.  Patient Medicare AWV questionnaire was completed by the patient on 01/06/2023; I have confirmed that all information answered by patient is correct and no changes since this date.  Cardiac Risk Factors include: advanced age (>28men, >87 women);male gender;dyslipidemia;hypertension     Objective:    Today's Vitals   01/11/23 1319  Weight: 181 lb (82.1 kg)  Height: 5\' 10"  (1.778 m)   Body mass index is 25.97 kg/m.     01/11/2023    1:34 PM 12/15/2022    1:33 PM 05/23/2022    2:15 PM 12/05/2021    8:00 PM 11/19/2021    2:16 PM 01/26/2021    8:18 PM 12/02/2020    6:04 PM  Advanced Directives  Does Patient Have a Medical Advance Directive? No No No No No No No  Would patient like information on creating a medical advance directive? Yes (MAU/Ambulatory/Procedural Areas - Information given) No - Patient declined  Yes (MAU/Ambulatory/Procedural Areas - Information given) No - Patient declined No - Patient declined No - Patient declined    Current Medications (verified) Outpatient Encounter Medications as of 01/11/2023  Medication Sig   acetaminophen (TYLENOL) 325 MG tablet Take 1-2 tablets (325-650 mg total) by mouth every 4 (four) hours as needed for mild pain.  (Patient taking differently: Take 2,000 mg by mouth daily. Taking 1000mg  bid)   albuterol (VENTOLIN HFA) 108 (90 Base) MCG/ACT inhaler INHALE 2 PUFFS EVERY 6 HOURS AS NEEDED FOR WHEEZING OR SHORTNESS OF BREATH   amLODipine (NORVASC) 5 MG tablet Take 1 tablet (5 mg total) by mouth daily.   guaiFENesin (MUCINEX) 600 MG 12 hr tablet Take 600 mg by mouth every 4 (four) hours as needed for cough or to loosen phlegm.   levothyroxine (SYNTHROID) 50 MCG tablet Take 1 tablet (50 mcg total) by mouth daily.   methocarbamol (ROBAXIN) 500 MG tablet Take 500 mg by mouth as needed for muscle spasms.   metoprolol succinate (TOPROL-XL) 50 MG 24 hr tablet TAKE ONE TABLET AT BEDTIME   montelukast (SINGULAIR) 10 MG tablet Take 1 tablet (10 mg total) by mouth daily.   Olopatadine HCl 0.2 % SOLN Place 1 drop into both eyes daily as needed (allergies).   oxyCODONE-acetaminophen (PERCOCET) 5-325 MG tablet Take 1-2 tablets by mouth every 6 (six) hours as needed.   pantoprazole (PROTONIX) 40 MG tablet TAKE 1 TABLET DAILY   rosuvastatin (CRESTOR) 20 MG tablet Take 1 tablet (20 mg total) by mouth daily.   warfarin (COUMADIN) 5 MG tablet TAKE ONE TABLET ONCE DAILY AT 4PM   No facility-administered encounter medications on file as of 01/11/2023.    Allergies (verified) Patient has no known allergies.   History: Past Medical History:  Diagnosis Date   Acute thoracic aortic dissection (HCC) 03/11/2020   Type A   Allergy  Asthma    Cataract    Cataract    Diverticulitis    Dyspnea    GERD (gastroesophageal reflux disease)    Heart murmur 12/2019   Hyperlipidemia    Hypertension    Inguinal hernia bilateral, non-recurrent    S/P aortic dissection repair 03/11/2020   supracoronary straight graft repair with resuspension of native aortic valve and open distal anastomosis for intraoperative acute type A aortic dissection    S/P mitral valve repair 03/11/2020   Complex valvuloplasty including artificial Gore-tex  neochord placement x 4, suture plication of posterior leaflet and posteromedial commissure and 32 mm Sorin Memo 4D ring annuloplasty   Sleep apnea    Past Surgical History:  Procedure Laterality Date   bilateral inguinal hernia     CARDIAC CATHETERIZATION Bilateral 02/21/2020   CHEST TUBE INSERTION N/A 03/22/2020   Procedure: CHEST TUBE INSERTION OF 28 BLAKE DRAIN.;  Surgeon: Delight Ovens, MD;  Location: [facility] OR;  Service: Thoracic;  Laterality: N/A;   COLONOSCOPY  06/09/2004   WUJ:[id number] colon diverticulosis, otherwise normal colonoscopy   EYE SURGERY  08/2014   HERNIA REPAIR     MITRAL VALVE REPAIR N/A 03/11/2020   Procedure: MITRAL VALVE REPAIR (MVR) USING MEMO 4D SIZE 32 RING;  Surgeon: Purcell Nails, MD;  Location: [facility] OR;  Service: Open Heart Surgery;  Laterality: N/A;   PALATE / UVULA BIOPSY / EXCISION     PERICARDIAL WINDOW N/A 03/22/2020   Procedure: SUBXYPHOID POST-OP PERICARDIAL FLUID DRAINAGE WITH CHEST TUBE INSERTION.;  Surgeon: Delight Ovens, MD;  Location: [facility] OR;  Service: Thoracic;  Laterality: N/A;   REPAIR OF ACUTE ASCENDING THORACIC AORTIC DISSECTION N/A 03/11/2020   Procedure: REPAIR OF ACUTE ASCENDING THORACIC AORTIC DISSECTION USING A HEMASHIELD PLATINUM STRAIGHT GRAFT AND A HEASHIELD PLATINUM 28X10MM SINGLE ARM GRAFT;  Surgeon: Purcell Nails, MD;  Location: [facility] OR;  Service: Open Heart Surgery;  Laterality: N/A;   RIGHT/LEFT HEART CATH AND CORONARY ANGIOGRAPHY N/A 02/21/2020   Procedure: RIGHT/LEFT HEART CATH AND CORONARY ANGIOGRAPHY;  Surgeon: Lennette Bihari, MD;  Location: [facility] INVASIVE CV LAB;  Service: Cardiovascular;  Laterality: N/A;   TEE WITHOUT CARDIOVERSION N/A 12/19/2019   Procedure: TRANSESOPHAGEAL ECHOCARDIOGRAM (TEE);  Surgeon: Jodelle Red, MD;  Location[facility] ENDOSCOPY;  Service: Cardiovascular;  Laterality: N/A;   TEE WITHOUT CARDIOVERSION N/A 03/11/2020   Procedure: TRANSESOPHAGEAL ECHOCARDIOGRAM (TEE);  Surgeon: Purcell Nails, MD;  Location: [facility] OR;  Service: Open Heart Surgery;  Laterality: N/A;   TEE WITHOUT CARDIOVERSION N/A 03/22/2020   Procedure: TRANSESOPHAGEAL ECHOCARDIOGRAM (TEE);  Surgeon: Delight Ovens, MD;  Location: [facility] OR;  Service: Thoracic;  Laterality: N/A;   TONSILLECTOMY     VASECTOMY     Family History  Problem Relation Age of Onset   Heart disease Mother    Hip fracture Mother 100   Osteoporosis Mother    Dementia Mother 46   Arthritis Father    Heart disease Father    Arthritis Sister    Arthritis Brother    Colon cancer Neg Hx    Social History   Socioeconomic History   Marital status: Married    Spouse name: Lurena Joiner   Number of children: 2   Years of education: 14   Highest education level: Some college, no degree  Occupational History   Occupation: retired    Associate Professor: Landscape architect    Comment: truck driving  Tobacco Use   Smoking status: Former    Current packs/day:  0.00    Average packs/day: 4.0 packs/day for 25.0 years (100.0 ttl pk-yrs)    Types: Cigarettes    Start date: 06/22/1957    Quit date: 06/23/1982    Years since quitting: 40.5   Smokeless tobacco: Never   Tobacco comments:    pt was a truck Psychiatric nurse   Vaping status: Never Used  Substance and Sexual Activity   Alcohol use: Yes    Alcohol/week: 5.0 standard drinks of alcohol    Types: 5 Shots of liquor per week    Comment: brandy a few times a month, beer once a month   Drug use: No   Sexual activity: Yes    Birth control/protection: Surgical  Other Topics Concern   Not on file  Social History Narrative   Not on file   Social Determinants of Health   Financial Resource Strain: Low Risk  (01/11/2023)   Overall Financial Resource Strain (CARDIA)    Difficulty of Paying Living Expenses: Not hard at all  Food Insecurity: No Food Insecurity (01/11/2023)   Hunger Vital Sign    Worried About Running Out of Food in the Last Year: Never true    Ran Out of Food in the Last Year: Never  true  Transportation Needs: No Transportation Needs (01/11/2023)   PRAPARE - Administrator, Civil Service (Medical): No    Lack of Transportation (Non-Medical): No  Physical Activity: Insufficiently Active (01/11/2023)   Exercise Vital Sign    Days of Exercise per Week: 3 days    Minutes of Exercise per Session: 30 min  Stress: No Stress Concern Present (01/11/2023)   [employer] of Occupational Health - Occupational Stress Questionnaire    Feeling of Stress : Not at all  Social Connections: Moderately Integrated (01/11/2023)   Social Connection and Isolation Panel [NHANES]    Frequency of Communication with Friends and Family: More than three times a week    Frequency of Social Gatherings with Friends and Family: More than three times a week    Attends Religious Services: More than 4 times per year    Active Member of [employer] or Organizations: No    Attends Engineer, structural: Never    Marital Status: Married    Tobacco Counseling Counseling given: Not Answered 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 0  Vision? 0  Difficulty concentrating or making decisions? 0  Walking or climbing stairs? 0  Dressing or bathing? 0  Doing errands, shopping? 0  Preparing Food and eating ? N  Using the Toilet? N  In the past six months, have you accidently leaked urine? N  Do you have problems with loss of bowel control? N  Managing your Medications? N   Managing your Finances? N  Housekeeping or managing your Housekeeping? N    Patient Care Team: Dettinger, Elige Radon, MD as PCP - General (Family Medicine) Lennette Bihari, MD as  PCP - Cardiology (Cardiology) West Bali, MD (Inactive) as Attending Physician (Gastroenterology) Moody Bruins as Physician Assistant (Chiropractic Medicine) Cherlyn Roberts, MD as Consulting Physician (Dermatology) Duke, Roe Rutherford, PA as Physician Assistant (Cardiology)  Indicate any recent Medical Services you may have received from other than [facility] providers in the past year (date may be approximate).     Assessment:   This is a routine wellness examination for [place].  Hearing/Vision screen Vision Screening - Comments:: Wears rx glasses - up to date with routine eye exams with  Dr.Patty    Goals Addressed             This Visit's Progress    Exercise 150 minutes per week (moderate activity)   On track      Depression Screen    01/11/2023    1:33 PM 11/23/2022    9:22 AM 09/12/2022   11:35 AM 09/05/2022   10:23 AM 08/26/2022    8:20 AM 08/17/2022    2:31 PM 08/17/2022    2:28 PM  PHQ 2/9 Scores  PHQ - 2 Score 0 1 4 5  0 1 0  PHQ- 9 Score 0  11 18 3 4      Fall Risk    01/11/2023    1:31 PM 11/23/2022    9:22 AM 09/12/2022   11:34 AM 09/05/2022   10:25 AM 08/26/2022    8:19 AM  Fall Risk   Falls in the past year? 0 0 1 1 1   Number falls in past yr: 0  0 0 0  Injury with Fall? 0  0 0 0  Risk for fall due to : No Fall Risks  Impaired balance/gait Impaired balance/gait Impaired balance/gait  Follow up Falls prevention discussed  Falls evaluation completed Falls evaluation completed Falls evaluation completed    MEDICARE RISK AT HOME: Medicare Risk at Home Any stairs in or around the home?: Yes If so, are there any without handrails?: No Home free of loose throw rugs in walkways, pet beds, electrical cords, etc?: Yes Adequate lighting in your home to reduce risk of falls?:  Yes Life alert?: No Use of a cane, walker or w/c?: No Grab bars in the bathroom?: Yes Shower chair or bench in shower?: Yes Elevated toilet seat or a handicapped toilet?: Yes  TIMED UP AND GO:  Was the test performed?  No    Cognitive Function:    01/23/2018    3:50 PM 01/18/2017    9:08 AM 01/19/2016    1:54 PM 03/11/2015   11:38 AM  MMSE - Mini Mental State Exam  Orientation to time 5 5 5 5   Orientation to Place 5 5 5 5   Registration 3 3 3 3   Attention/ Calculation 5 3 5 5   Recall 2 3 3 2   Language- name 2 objects 2 2 2 2   Language- repeat 1 1 1 1   Language- follow 3 step command 3 3 3 3   Language- read & follow direction 1 1 1 1   Write a sentence 1 1 1 1   Copy design 1 1 1 1   Total score 29 28 30 29         01/11/2023    1:35 PM 11/19/2021    2:15 PM 10/16/2020   10:46 AM 01/29/2019    9:43 AM  6CIT Screen  What Year? 0 points 0 points 0 points 0 points  What month? 0 points 0 points 0 points 0 points  What time? 0 points 0 points  0 points 0 points  Count back from 20 0 points 0 points 0 points 0 points  Months in reverse 0 points 0 points 0 points 0 points  Repeat phrase 0 points 2 points 2 points 0 points  Total Score 0 points 2 points 2 points 0 points    Immunizations Immunization History  Administered Date(s) Administered   Fluad Quad(high Dose 65+) 02/13/2019, 01/06/2020, 02/05/2021, 01/05/2022   Influenza, High Dose Seasonal PF 01/23/2018   Influenza,inj,Quad PF,6+ Mos 01/14/2013, 03/14/2014, 03/11/2015, 01/19/2016, 01/18/2017   Moderna Sars-Covid-2 Vaccination 05/10/2019, 06/07/2019, 03/05/2020   Pneumococcal Conjugate-13 03/14/2014   Pneumococcal Polysaccharide-23 01/14/2013   Tdap 07/06/2020   Zoster Recombinant(Shingrix) 07/08/2021, 01/05/2022   Zoster, Live 06/14/2011    TDAP status: Up to date  Flu Vaccine status: Due, Education has been provided regarding the importance of this vaccine. Advised may receive this vaccine at local pharmacy or  Health Dept. Aware to provide a copy of the vaccination record if obtained from local pharmacy or Health Dept. Verbalized acceptance and understanding.  Pneumococcal vaccine status: Due, Education has been provided regarding the importance of this vaccine. Advised may receive this vaccine at local pharmacy or Health Dept. Aware to provide a copy of the vaccination record if obtained from local pharmacy or Health Dept. Verbalized acceptance and understanding.  Covid-19 vaccine status: Completed vaccines  Qualifies for Shingles Vaccine? Yes   Zostavax completed No   Shingrix Completed?: No.    Education has been provided regarding the importance of this vaccine. Patient has been advised to call insurance company to determine out of pocket expense if they have not yet received this vaccine. Advised may also receive vaccine at local pharmacy or Health Dept. Verbalized acceptance and understanding.  Screening Tests Health Maintenance  Topic Date Due   INFLUENZA VACCINE  11/03/2022   COVID-19 Vaccine (4 - 2023-24 season) 12/04/2022   Medicare Annual Wellness (AWV)  01/11/2024   DTaP/Tdap/Td (2 - Td or Tdap) 07/07/2030   Pneumonia Vaccine 66+ Years old  Completed   Zoster Vaccines- Shingrix  Completed   HPV VACCINES  Aged Out    Health Maintenance  Health Maintenance Due  Topic Date Due   INFLUENZA VACCINE  11/03/2022   COVID-19 Vaccine (4 - 2023-24 season) 12/04/2022    Colorectal cancer screening: No longer required.   Lung Cancer Screening: (Low Dose CT Chest recommended if Age 62-80 years, 20 pack-year currently smoking OR have quit w/in 15years.) does not qualify.   Lung Cancer Screening Referral: n/a  Additional Screening:  Hepatitis C Screening: does not qualify;   Vision Screening: Recommended annual ophthalmology exams for early detection of glaucoma and other disorders of the eye. Is the patient up to date with their annual eye exam?  Yes  Who is the provider or what is  the name of the office in which the patient attends annual eye exams? Dr.Haggerman  If pt is not established with a provider, would they like to be referred to a provider to establish care? No .   Dental Screening: Recommended annual dental exams for proper oral hygiene   Community Resource Referral / Chronic Care Management: CRR required this visit?  No   CCM required this visit?  No     Plan:     I have personally reviewed and noted the following in the patient's chart:   Medical and social history Use of alcohol, tobacco or illicit drugs  Current medications and supplements including opioid prescriptions. Patient is not currently  taking opioid prescriptions. Functional ability and status Nutritional status Physical activity Advanced directives List of other physicians Hospitalizations, surgeries, and ER visits in previous 12 months Vitals Screenings to include cognitive, depression, and falls Referrals and appointments  In addition, I have reviewed and discussed with patient certain preventive protocols, quality metrics, and best practice recommendations. A written personalized care plan for preventive services as well as general preventive health recommendations were provided to patient.     Lorrene Reid, LPN   [id number]/12/[id number]   After Visit Summary: (MyChart) Due to this being a telephonic visit, the after visit summary with patients personalized plan was offered to patient via MyChart   Nurse Notes: none

## 2023-01-11 NOTE — Patient Instructions (Signed)
Mr. Klemp , Thank you for taking time to come for your Medicare Wellness Visit. I appreciate your ongoing commitment to your health goals. Please review the following plan we discussed and let me know if I can assist you in the future.   Referrals/Orders/Follow-Ups/Clinician Recommendations: Aim for 30 minutes of exercise or brisk walking, 6-8 glasses of water, and 5 servings of fruits and vegetables each day.   This is a list of the screening recommended for you and due dates:  Health Maintenance  Topic Date Due   Flu Shot  11/03/2022   COVID-19 Vaccine (4 - 2023-24 season) 12/04/2022   Medicare Annual Wellness Visit  01/11/2024   DTaP/Tdap/Td vaccine (2 - Td or Tdap) 07/07/2030   Pneumonia Vaccine  Completed   Zoster (Shingles) Vaccine  Completed   HPV Vaccine  Aged Out    Advanced directives: (Provided) Advance directive discussed with you today. I have provided a copy for you to complete at home and have notarized. Once this is complete, please bring a copy in to our office so we can scan it into your chart. Information on Advanced Care Planning can be found at [facility] of [place] Advance Health Care Directives Advance Health Care Directives ([email or web address]    Next Medicare Annual Wellness Visit scheduled for next year: Yes  insert Preventive Care attachment Insert FALL PREVENTION attachment if needed

## 2023-01-12 ENCOUNTER — Ambulatory Visit (HOSPITAL_BASED_OUTPATIENT_CLINIC_OR_DEPARTMENT_OTHER): Payer: Medicare Other

## 2023-01-12 ENCOUNTER — Encounter (HOSPITAL_BASED_OUTPATIENT_CLINIC_OR_DEPARTMENT_OTHER): Payer: Self-pay

## 2023-01-12 DIAGNOSIS — R293 Abnormal posture: Secondary | ICD-10-CM | POA: Diagnosis not present

## 2023-01-12 DIAGNOSIS — R262 Difficulty in walking, not elsewhere classified: Secondary | ICD-10-CM | POA: Diagnosis not present

## 2023-01-12 DIAGNOSIS — R2681 Unsteadiness on feet: Secondary | ICD-10-CM | POA: Diagnosis not present

## 2023-01-12 DIAGNOSIS — G8929 Other chronic pain: Secondary | ICD-10-CM | POA: Diagnosis not present

## 2023-01-12 DIAGNOSIS — M25561 Pain in right knee: Secondary | ICD-10-CM | POA: Diagnosis not present

## 2023-01-12 DIAGNOSIS — M5459 Other low back pain: Secondary | ICD-10-CM | POA: Diagnosis not present

## 2023-01-12 DIAGNOSIS — M6281 Muscle weakness (generalized): Secondary | ICD-10-CM
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
## 2023-01-16 ENCOUNTER — Encounter (HOSPITAL_BASED_OUTPATIENT_CLINIC_OR_DEPARTMENT_OTHER): Payer: Self-pay | Admitting: Physical Therapy

## 2023-01-16 ENCOUNTER — Ambulatory Visit (HOSPITAL_BASED_OUTPATIENT_CLINIC_OR_DEPARTMENT_OTHER): Payer: Medicare Other | Admitting: Physical Therapy

## 2023-01-16 DIAGNOSIS — M25561 Pain in right knee: Secondary | ICD-10-CM | POA: Diagnosis not present

## 2023-01-16 DIAGNOSIS — M6281 Muscle weakness (generalized): Secondary | ICD-10-CM

## 2023-01-16 DIAGNOSIS — R2681 Unsteadiness on feet: Secondary | ICD-10-CM | POA: Diagnosis not present

## 2023-01-16 DIAGNOSIS — R262 Difficulty in walking, not elsewhere classified: Secondary | ICD-10-CM

## 2023-01-16 DIAGNOSIS — G8929 Other chronic pain: Secondary | ICD-10-CM

## 2023-01-16 DIAGNOSIS — R293 Abnormal posture: Secondary | ICD-10-CM | POA: Diagnosis not present

## 2023-01-16 DIAGNOSIS — M5459 Other low back pain: Secondary | ICD-10-CM | POA: Diagnosis not present
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
## 2023-01-17 ENCOUNTER — Ambulatory Visit (INDEPENDENT_AMBULATORY_CARE_PROVIDER_SITE_OTHER): Payer: Medicare Other

## 2023-01-17 DIAGNOSIS — Z23 Encounter for immunization: Secondary | ICD-10-CM

## 2023-01-19 ENCOUNTER — Encounter (HOSPITAL_BASED_OUTPATIENT_CLINIC_OR_DEPARTMENT_OTHER): Payer: Self-pay | Admitting: Physical Therapy

## 2023-01-19 ENCOUNTER — Ambulatory Visit (HOSPITAL_BASED_OUTPATIENT_CLINIC_OR_DEPARTMENT_OTHER): Payer: Medicare Other | Admitting: Physical Therapy

## 2023-01-19 DIAGNOSIS — M5459 Other low back pain: Secondary | ICD-10-CM

## 2023-01-19 DIAGNOSIS — M6281 Muscle weakness (generalized): Secondary | ICD-10-CM | POA: Diagnosis not present

## 2023-01-19 DIAGNOSIS — R262 Difficulty in walking, not elsewhere classified: Secondary | ICD-10-CM | POA: Diagnosis not present

## 2023-01-19 DIAGNOSIS — G8929 Other chronic pain: Secondary | ICD-10-CM | POA: Diagnosis not present

## 2023-01-19 DIAGNOSIS — R293 Abnormal posture: Secondary | ICD-10-CM | POA: Diagnosis not present

## 2023-01-19 DIAGNOSIS — R2681 Unsteadiness on feet: Secondary | ICD-10-CM | POA: Diagnosis not present

## 2023-01-19 DIAGNOSIS — M25561 Pain in right knee: Secondary | ICD-10-CM | POA: Diagnosis not present
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
## 2023-01-20 ENCOUNTER — Encounter (HOSPITAL_BASED_OUTPATIENT_CLINIC_OR_DEPARTMENT_OTHER): Payer: Self-pay | Admitting: Physical Therapy

## 2023-01-25 ENCOUNTER — Ambulatory Visit (HOSPITAL_BASED_OUTPATIENT_CLINIC_OR_DEPARTMENT_OTHER): Payer: Medicare Other | Admitting: Physical Therapy

## 2023-01-27 ENCOUNTER — Encounter (HOSPITAL_BASED_OUTPATIENT_CLINIC_OR_DEPARTMENT_OTHER): Payer: Self-pay | Admitting: Physical Therapy

## 2023-01-27 ENCOUNTER — Ambulatory Visit (HOSPITAL_BASED_OUTPATIENT_CLINIC_OR_DEPARTMENT_OTHER): Payer: Medicare Other | Admitting: Physical Therapy

## 2023-01-27 DIAGNOSIS — G8929 Other chronic pain: Secondary | ICD-10-CM

## 2023-01-27 DIAGNOSIS — R293 Abnormal posture: Secondary | ICD-10-CM | POA: Diagnosis not present

## 2023-01-27 DIAGNOSIS — M5459 Other low back pain: Secondary | ICD-10-CM | POA: Diagnosis not present

## 2023-01-27 DIAGNOSIS — M25561 Pain in right knee: Secondary | ICD-10-CM | POA: Diagnosis not present

## 2023-01-27 DIAGNOSIS — R262 Difficulty in walking, not elsewhere classified: Secondary | ICD-10-CM

## 2023-01-27 DIAGNOSIS — M6281 Muscle weakness (generalized): Secondary | ICD-10-CM

## 2023-01-27 DIAGNOSIS — R2681 Unsteadiness on feet: Secondary | ICD-10-CM | POA: Diagnosis not present
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
## 2023-01-30 ENCOUNTER — Encounter (HOSPITAL_BASED_OUTPATIENT_CLINIC_OR_DEPARTMENT_OTHER): Payer: Self-pay | Admitting: Physical Therapy

## 2023-01-30 ENCOUNTER — Ambulatory Visit (HOSPITAL_BASED_OUTPATIENT_CLINIC_OR_DEPARTMENT_OTHER): Payer: Medicare Other | Admitting: Physical Therapy

## 2023-01-30 DIAGNOSIS — R2681 Unsteadiness on feet: Secondary | ICD-10-CM | POA: Diagnosis not present

## 2023-01-30 DIAGNOSIS — M25561 Pain in right knee: Secondary | ICD-10-CM | POA: Diagnosis not present

## 2023-01-30 DIAGNOSIS — G8929 Other chronic pain: Secondary | ICD-10-CM

## 2023-01-30 DIAGNOSIS — M5459 Other low back pain: Secondary | ICD-10-CM | POA: Diagnosis not present

## 2023-01-30 DIAGNOSIS — R262 Difficulty in walking, not elsewhere classified: Secondary | ICD-10-CM | POA: Diagnosis not present

## 2023-01-30 DIAGNOSIS — R293 Abnormal posture: Secondary | ICD-10-CM | POA: Diagnosis not present

## 2023-01-30 DIAGNOSIS — M6281 Muscle weakness (generalized): Secondary | ICD-10-CM | POA: Diagnosis not present
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
## 2023-02-02 ENCOUNTER — Encounter (HOSPITAL_BASED_OUTPATIENT_CLINIC_OR_DEPARTMENT_OTHER): Payer: Self-pay | Admitting: Physical Therapy

## 2023-02-02 ENCOUNTER — Ambulatory Visit (HOSPITAL_BASED_OUTPATIENT_CLINIC_OR_DEPARTMENT_OTHER): Payer: Medicare Other | Admitting: Physical Therapy

## 2023-02-02 DIAGNOSIS — M25561 Pain in right knee: Secondary | ICD-10-CM | POA: Diagnosis not present

## 2023-02-02 DIAGNOSIS — G8929 Other chronic pain: Secondary | ICD-10-CM | POA: Diagnosis not present

## 2023-02-02 DIAGNOSIS — M6281 Muscle weakness (generalized): Secondary | ICD-10-CM | POA: Diagnosis not present

## 2023-02-02 DIAGNOSIS — R293 Abnormal posture: Secondary | ICD-10-CM | POA: Diagnosis not present

## 2023-02-02 DIAGNOSIS — R2681 Unsteadiness on feet: Secondary | ICD-10-CM

## 2023-02-02 DIAGNOSIS — M5459 Other low back pain: Secondary | ICD-10-CM | POA: Diagnosis not present

## 2023-02-02 DIAGNOSIS — R262 Difficulty in walking, not elsewhere classified: Secondary | ICD-10-CM | POA: Diagnosis not present
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
## 2023-02-07 ENCOUNTER — Ambulatory Visit (HOSPITAL_BASED_OUTPATIENT_CLINIC_OR_DEPARTMENT_OTHER): Payer: Medicare Other | Admitting: Physical Therapy

## 2023-02-08 DIAGNOSIS — Z85828 Personal history of other malignant neoplasm of skin: Secondary | ICD-10-CM | POA: Diagnosis not present

## 2023-02-08 DIAGNOSIS — L821 Other seborrheic keratosis: Secondary | ICD-10-CM | POA: Diagnosis not present

## 2023-02-08 DIAGNOSIS — D0461 Carcinoma in situ of skin of right upper limb, including shoulder: Secondary | ICD-10-CM | POA: Diagnosis not present

## 2023-02-08 DIAGNOSIS — L570 Actinic keratosis: Secondary | ICD-10-CM | POA: Diagnosis not present

## 2023-02-08 DIAGNOSIS — C4442 Squamous cell carcinoma of skin of scalp and neck: Secondary | ICD-10-CM | POA: Diagnosis not present

## 2023-02-09 ENCOUNTER — Encounter (HOSPITAL_BASED_OUTPATIENT_CLINIC_OR_DEPARTMENT_OTHER): Payer: Self-pay | Admitting: Physical Therapy

## 2023-02-09 ENCOUNTER — Ambulatory Visit (HOSPITAL_BASED_OUTPATIENT_CLINIC_OR_DEPARTMENT_OTHER): Payer: Medicare Other | Attending: Anesthesiology | Admitting: Physical Therapy

## 2023-02-09 DIAGNOSIS — R2681 Unsteadiness on feet: Secondary | ICD-10-CM | POA: Diagnosis not present

## 2023-02-09 DIAGNOSIS — M5459 Other low back pain: Secondary | ICD-10-CM | POA: Diagnosis not present

## 2023-02-09 DIAGNOSIS — M6281 Muscle weakness (generalized): Secondary | ICD-10-CM | POA: Diagnosis not present
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
## 2023-02-13 ENCOUNTER — Other Ambulatory Visit: Payer: Self-pay | Admitting: Surgery

## 2023-02-13 DIAGNOSIS — I7101 Dissection of ascending aorta: Secondary | ICD-10-CM

## 2023-02-14 ENCOUNTER — Ambulatory Visit (HOSPITAL_BASED_OUTPATIENT_CLINIC_OR_DEPARTMENT_OTHER): Payer: Medicare Other | Admitting: Physical Therapy

## 2023-02-16 ENCOUNTER — Encounter (HOSPITAL_BASED_OUTPATIENT_CLINIC_OR_DEPARTMENT_OTHER): Payer: Medicare Other | Admitting: Physical Therapy

## 2023-02-16 ENCOUNTER — Ambulatory Visit: Payer: Medicare Other | Attending: Cardiology | Admitting: *Deleted

## 2023-02-16 DIAGNOSIS — Z5181 Encounter for therapeutic drug level monitoring: Secondary | ICD-10-CM | POA: Diagnosis not present

## 2023-02-16 DIAGNOSIS — I71019 Dissection of thoracic aorta, unspecified: Secondary | ICD-10-CM

## 2023-02-16 DIAGNOSIS — Z9889 Other specified postprocedural states: Secondary | ICD-10-CM | POA: Diagnosis not present

## 2023-02-16 LAB — POCT INR: INR: 3.8 — AB (ref 2.0–3.0)

## 2023-02-16 NOTE — Patient Instructions (Signed)
Description   Do not take any warfarin today then continue taking Warfarin 1/2 tablet daily except for 1 tablet on Mondays, Wednesdays, and Fridays. Recheck INR in 4 weeks. [phone]

## 2023-02-17 DIAGNOSIS — B351 Tinea unguium: Secondary | ICD-10-CM | POA: Diagnosis not present

## 2023-02-17 DIAGNOSIS — M2042 Other hammer toe(s) (acquired), left foot: Secondary | ICD-10-CM | POA: Diagnosis not present

## 2023-02-17 DIAGNOSIS — L84 Corns and callosities: Secondary | ICD-10-CM | POA: Diagnosis not present

## 2023-02-20 ENCOUNTER — Other Ambulatory Visit: Payer: Self-pay | Admitting: Family Medicine

## 2023-02-21 ENCOUNTER — Ambulatory Visit (HOSPITAL_BASED_OUTPATIENT_CLINIC_OR_DEPARTMENT_OTHER): Payer: Medicare Other | Admitting: Physical Therapy

## 2023-02-21 ENCOUNTER — Other Ambulatory Visit: Payer: Self-pay | Admitting: Cardiovascular Disease

## 2023-02-23 ENCOUNTER — Telehealth: Payer: Self-pay | Admitting: Cardiovascular Disease

## 2023-02-23 ENCOUNTER — Encounter (HOSPITAL_BASED_OUTPATIENT_CLINIC_OR_DEPARTMENT_OTHER): Payer: Medicare Other | Admitting: Physical Therapy

## 2023-02-23 MED ORDER — METOPROLOL SUCCINATE ER 50 MG PO TB24: ORAL_TABLET | ORAL | 3 refills | Status: DC

## 2023-02-23 NOTE — Telephone Encounter (Signed)
Pt c/o medication issue:  1. Name of Medication:   metoprolol succinate (TOPROL-XL) 50 MG 24 hr tablet   2. How are you currently taking this medication (dosage and times per day)?   As prescribed  3. Are you having a reaction (difficulty breathing--STAT)?   No  4. What is your medication issue?   Patient wants to confirm if he should still be taking this medication.  Patient stated he is now out of this medication and needs a refill send to [facility] [place], [place] - [address]

## 2023-02-23 NOTE — Telephone Encounter (Signed)
Refills sent to [facility] for Metoprolol # 90 with 3 refills

## 2023-03-10 ENCOUNTER — Other Ambulatory Visit: Payer: Self-pay | Admitting: Cardiovascular Disease

## 2023-03-10 ENCOUNTER — Other Ambulatory Visit: Payer: Self-pay | Admitting: Family Medicine

## 2023-03-16 ENCOUNTER — Ambulatory Visit: Payer: Medicare Other | Attending: Cardiovascular Disease

## 2023-03-16 DIAGNOSIS — I71019 Dissection of thoracic aorta, unspecified: Secondary | ICD-10-CM

## 2023-03-16 DIAGNOSIS — Z9889 Other specified postprocedural states: Secondary | ICD-10-CM

## 2023-03-16 LAB — POCT INR: INR: 2.6 (ref 2.0–3.0)

## 2023-03-16 NOTE — Patient Instructions (Signed)
Description   Continue taking Warfarin 1/2 tablet daily except for 1 tablet on Mondays, Wednesdays, and Fridays.  Recheck INR in 5 weeks.  [facility] [phone]

## 2023-03-24 ENCOUNTER — Encounter: Payer: Self-pay | Admitting: Family Medicine

## 2023-03-24 ENCOUNTER — Ambulatory Visit (INDEPENDENT_AMBULATORY_CARE_PROVIDER_SITE_OTHER): Payer: Medicare Other | Admitting: Family Medicine

## 2023-03-24 VITALS — BP 135/82 | HR 68 | Temp 97.9°F | Ht 70.0 in | Wt 181.8 lb

## 2023-03-24 DIAGNOSIS — E039 Hypothyroidism, unspecified: Secondary | ICD-10-CM

## 2023-03-24 DIAGNOSIS — E7800 Pure hypercholesterolemia, unspecified: Secondary | ICD-10-CM

## 2023-03-24 DIAGNOSIS — I10 Essential (primary) hypertension: Secondary | ICD-10-CM | POA: Diagnosis not present

## 2023-03-24 LAB — LIPID PANEL

## 2023-03-24 MED ORDER — WARFARIN SODIUM 5 MG PO TABS: ORAL_TABLET | ORAL | 3 refills | Status: AC

## 2023-03-24 NOTE — Progress Notes (Signed)
BP 135/82 (BP Location: Left Arm)   Pulse 68   Temp 97.9 F (36.6 C) (Temporal)   Ht 5\' 10"  (1.778 m)   Wt 181 lb 12.8 oz (82.5 kg)   SpO2 98%   BMI 26.09 kg/m    Subjective:   Patient ID: Patrick Bell, male    DOB: 01-13-1941, 82 y.o.   MRN: [id number]  HPI: Patrick Bell is a 82 y.o. male presenting on 03/24/2023 for Gastroesophageal Reflux and Medical Management of Chronic Issues   HPI Hypertension Patient is currently on amlodipine and metoprolol, and their blood pressure today is 135/82. Patient denies any lightheadedness or dizziness. Patient denies headaches, blurred vision, chest pains, shortness of breath, or weakness. Denies any side effects from medication and is content with current medication.   Hyperlipidemia Patient is coming in for recheck of his hyperlipidemia. The patient is currently taking Crestor. They deny any issues with myalgias or history of liver damage from it. They deny any focal numbness or weakness or chest pain.   Hypothyroidism recheck Patient is coming in for thyroid recheck today as well. They deny any issues with hair changes or heat or cold problems or diarrhea or constipation. They deny any chest pain or palpitations. They are currently on levothyroxine 50 micrograms   Back pain, he still been seeing an orthopedic for his back pain.  He is taking a muscle relaxer and that seems to be helping him right now, uses it about once a week which is the methocarbamol, will continue with that and if he needs refills in the future we can take that over.  Relevant past medical, surgical, family and social history reviewed and updated as indicated. Interim medical history since our last visit reviewed. Allergies and medications reviewed and updated.  Review of Systems  Constitutional:  Negative for chills and fever.  Eyes:  Negative for visual disturbance.  Respiratory:  Negative for shortness of breath and wheezing.   Cardiovascular:  Negative for  chest pain and leg swelling.  Musculoskeletal:  Positive for back pain. Negative for gait problem.  Skin:  Negative for rash.  All other systems reviewed and are negative.   Per HPI unless specifically indicated above   Allergies as of 03/24/2023   No Known Allergies      Medication List        Accurate as of March 24, 2023 11:10 AM. If you have any questions, ask your nurse or doctor.          acetaminophen 325 MG tablet Commonly known as: TYLENOL Take 1-2 tablets (325-650 mg total) by mouth every 4 (four) hours as needed for mild pain. What changed:  how much to take when to take this additional instructions   albuterol 108 (90 Base) MCG/ACT inhaler Commonly known as: VENTOLIN HFA INHALE 2 PUFFS EVERY 6 HOURS AS NEEDED FOR WHEEZING OR SHORTNESS OF BREATH   amLODipine 5 MG tablet Commonly known as: NORVASC Take 1 tablet (5 mg total) by mouth daily.   guaiFENesin 600 MG 12 hr tablet Commonly known as: MUCINEX Take 600 mg by mouth every 4 (four) hours as needed for cough or to loosen phlegm.   levothyroxine 50 MCG tablet Commonly known as: SYNTHROID Take 1 tablet (50 mcg total) by mouth daily.   methocarbamol 500 MG tablet Commonly known as: ROBAXIN Take 500 mg by mouth as needed for muscle spasms.   metoprolol succinate 50 MG 24 hr tablet Commonly known as: TOPROL-XL TAKE ONE  TABLET AT BEDTIME   montelukast 10 MG tablet Commonly known as: SINGULAIR Take 1 tablet (10 mg total) by mouth daily.   Olopatadine HCl 0.2 % Soln Place 1 drop into both eyes daily as needed (allergies).   oxyCODONE-acetaminophen 5-325 MG tablet Commonly known as: Percocet Take 1-2 tablets by mouth every 6 (six) hours as needed.   pantoprazole 40 MG tablet Commonly known as: PROTONIX TAKE 1 TABLET DAILY   rosuvastatin 20 MG tablet Commonly known as: CRESTOR TAKE ONE TABLET ONCE DAILY   warfarin 5 MG tablet Commonly known as: COUMADIN Take as directed by the  anticoagulation clinic. If you are unsure how to take this medication, talk to your nurse or doctor. Original instructions: TAKE ONE TABLET ONCE DAILY AT 4PM         Objective:   BP 135/82 (BP Location: Left Arm)   Pulse 68   Temp 97.9 F (36.6 C) (Temporal)   Ht 5\' 10"  (1.778 m)   Wt 181 lb 12.8 oz (82.5 kg)   SpO2 98%   BMI 26.09 kg/m   Wt Readings from Last 3 Encounters:  03/24/23 181 lb 12.8 oz (82.5 kg)  01/11/23 181 lb (82.1 kg)  11/24/22 181 lb (82.1 kg)    Physical Exam Vitals and nursing note reviewed.  Constitutional:      General: He is not in acute distress.    Appearance: He is well-developed. He is not diaphoretic.  Eyes:     General: No scleral icterus.    Conjunctiva/sclera: Conjunctivae normal.  Neck:     Thyroid: No thyromegaly.  Cardiovascular:     Rate and Rhythm: Normal rate and regular rhythm.     Heart sounds: Normal heart sounds. No murmur heard. Pulmonary:     Effort: Pulmonary effort is normal. No respiratory distress.     Breath sounds: Normal breath sounds. No wheezing.  Musculoskeletal:        General: No swelling. Normal range of motion.     Cervical back: Neck supple.  Lymphadenopathy:     Cervical: No cervical adenopathy.  Skin:    General: Skin is warm and dry.     Findings: No rash.  Neurological:     Mental Status: He is alert and oriented to person, place, and time.     Coordination: Coordination normal.  Psychiatric:        Behavior: Behavior normal.       Assessment & Plan:   Problem List Items Addressed This Visit       Cardiovascular and Mediastinum   Essential hypertension, benign - Primary   Relevant Medications   warfarin (COUMADIN) 5 MG tablet   Other Relevant Orders   CBC with Differential/Platelet   CMP14+EGFR     Endocrine   Hypothyroidism   Relevant Orders   TSH     Other   Hyperlipidemia   Relevant Medications   warfarin (COUMADIN) 5 MG tablet   Other Relevant Orders   Lipid panel  Back  seems to be stable.  Everything else seems to be doing well, will check blood work.  If he needs methocarbamol in the future I agreed that we can refill it.  He says he has plenty right now.  Follow up plan: Return in about 4 months (around 07/23/2023), or if symptoms worsen or fail to improve, for Thyroid and cholesterol hypertension recheck.  Counseling provided for all of the vaccine components Orders Placed This Encounter  Procedures   CBC with Differential/Platelet  CMP14+EGFR   Lipid panel   TSH    Arville Care, MD [facility] 03/24/2023, 11:10 AM

## 2023-03-25 LAB — CBC WITH DIFFERENTIAL/PLATELET
Basophils Absolute: 0 10*3/uL (ref 0.0–0.2)
Basos: 1 %
EOS (ABSOLUTE): 0 10*3/uL (ref 0.0–0.4)
Eos: 1 %
Hematocrit: 47.7 % (ref 37.5–51.0)
Hemoglobin: 15.5 g/dL (ref 13.0–17.7)
Immature Grans (Abs): 0 10*3/uL (ref 0.0–0.1)
Immature Granulocytes: 0 %
Lymphocytes Absolute: 0.8 10*3/uL (ref 0.7–3.1)
Lymphs: 17 %
MCH: 29.4 pg (ref 26.6–33.0)
MCHC: 32.5 g/dL (ref 31.5–35.7)
MCV: 90 fL (ref 79–97)
Monocytes Absolute: 0.5 10*3/uL (ref 0.1–0.9)
Monocytes: 9 %
Neutrophils Absolute: 3.5 10*3/uL (ref 1.4–7.0)
Neutrophils: 72 %
Platelets: 184 10*3/uL (ref 150–450)
RBC: 5.28 x10E6/uL (ref 4.14–5.80)
RDW: 13.2 % (ref 11.6–15.4)
WBC: 4.9 10*3/uL (ref 3.4–10.8)

## 2023-03-25 LAB — CMP14+EGFR
ALT: 13 IU/L (ref 0–44)
AST: 26 IU/L (ref 0–40)
Albumin: 4.2 g/dL (ref 3.7–4.7)
Alkaline Phosphatase: 66 IU/L (ref 44–121)
BUN/Creatinine Ratio: 15 (ref 10–24)
BUN: 16 mg/dL (ref 8–27)
Bilirubin Total: 0.6 mg/dL (ref 0.0–1.2)
CO2: 24 mmol/L (ref 20–29)
Calcium: 9.1 mg/dL (ref 8.6–10.2)
Chloride: 106 mmol/L (ref 96–106)
Creatinine, Ser: 1.07 mg/dL (ref 0.76–1.27)
Globulin, Total: 1.9 g/dL (ref 1.5–4.5)
Glucose: 86 mg/dL (ref 70–99)
Potassium: 4.6 mmol/L (ref 3.5–5.2)
Sodium: 143 mmol/L (ref 134–144)
Total Protein: 6.1 g/dL (ref 6.0–8.5)
eGFR: 69 mL/min/{1.73_m2} (ref >59)

## 2023-03-25 LAB — LIPID PANEL
Cholesterol, Total: 150 mg/dL (ref 100–199)
HDL: 69 mg/dL (ref >39)
LDL CALC COMMENT:: 2.2 ratio (ref 0.0–5.0)
LDL Chol Calc (NIH): 66 mg/dL (ref 0–99)
Triglycerides: 75 mg/dL (ref 0–149)
VLDL Cholesterol Cal: 15 mg/dL (ref 5–40)

## 2023-03-25 LAB — TSH: TSH: 1.36 u[IU]/mL (ref 0.450–4.500)

## 2023-03-27 ENCOUNTER — Ambulatory Visit
Admission: RE | Admit: 2023-03-27 | Discharge: 2023-03-27 | Disposition: A | Discharge status: Home / Self Care | Payer: Medicare Other | Source: Ambulatory Visit | Attending: Surgery | Admitting: Surgery

## 2023-03-27 DIAGNOSIS — I71012 Dissection of descending thoracic aorta: Secondary | ICD-10-CM | POA: Diagnosis not present

## 2023-03-27 DIAGNOSIS — Z9889 Other specified postprocedural states: Secondary | ICD-10-CM | POA: Diagnosis not present

## 2023-03-27 DIAGNOSIS — I7101 Dissection of ascending aorta: Secondary | ICD-10-CM

## 2023-03-27 MED ORDER — IOPAMIDOL (ISOVUE-370) INJECTION 76%
200.0000 mL | Freq: Once | INTRAVENOUS | Status: AC | PRN
  Administered 2023-03-27: 75 mL via INTRAVENOUS

## 2023-04-03 ENCOUNTER — Ambulatory Visit: Payer: Medicare Other | Admitting: Surgery

## 2023-04-03 ENCOUNTER — Encounter: Payer: Self-pay | Admitting: Surgery

## 2023-04-03 ENCOUNTER — Other Ambulatory Visit: Payer: Medicare Other

## 2023-04-03 VITALS — BP 121/74 | HR 75 | Resp 20 | Ht 70.0 in | Wt 182.0 lb

## 2023-04-03 DIAGNOSIS — I7101 Dissection of ascending aorta: Secondary | ICD-10-CM | POA: Diagnosis not present

## 2023-04-03 NOTE — Progress Notes (Unsigned)
    HPI: ***  Current Outpatient Medications  Medication Sig Dispense Refill   acetaminophen (TYLENOL) 325 MG tablet Take 1-2 tablets (325-650 mg total) by mouth every 4 (four) hours as needed for mild pain. (Patient taking differently: Take 2,000 mg by mouth daily. Taking 1000mg  bid)     albuterol (VENTOLIN HFA) 108 (90 Base) MCG/ACT inhaler INHALE 2 PUFFS EVERY 6 HOURS AS NEEDED FOR WHEEZING OR SHORTNESS OF BREATH 8.5 g 2   amLODipine (NORVASC) 5 MG tablet Take 1 tablet (5 mg total) by mouth daily. 30 tablet 9   guaiFENesin (MUCINEX) 600 MG 12 hr tablet Take 600 mg by mouth every 4 (four) hours as needed for cough or to loosen phlegm.     levothyroxine (SYNTHROID) 50 MCG tablet Take 1 tablet (50 mcg total) by mouth daily. 90 tablet 3   methocarbamol (ROBAXIN) 500 MG tablet Take 500 mg by mouth as needed for muscle spasms.     metoprolol succinate (TOPROL-XL) 50 MG 24 hr tablet TAKE ONE TABLET AT BEDTIME 90 tablet 3   montelukast (SINGULAIR) 10 MG tablet Take 1 tablet (10 mg total) by mouth daily. 90 tablet 3   Olopatadine HCl 0.2 % SOLN Place 1 drop into both eyes daily as needed (allergies).     oxyCODONE-acetaminophen (PERCOCET) 5-325 MG tablet Take 1-2 tablets by mouth every 6 (six) hours as needed. 120 tablet 0   pantoprazole (PROTONIX) 40 MG tablet TAKE 1 TABLET DAILY 90 tablet 3   rosuvastatin (CRESTOR) 20 MG tablet TAKE ONE TABLET ONCE DAILY 90 tablet 2   warfarin (COUMADIN) 5 MG tablet TAKE ONE TABLET ONCE DAILY AT 4PM 90 tablet 3   No current facility-administered medications for this visit.     Physical Exam: ***  Diagnostic Tests: ***  Impression: ***  Plan: ***   Alleen Borne, MD [facility] [phone]

## 2023-04-20 ENCOUNTER — Ambulatory Visit: Payer: Medicare Other | Attending: Cardiovascular Disease | Admitting: *Deleted

## 2023-04-20 DIAGNOSIS — Z9889 Other specified postprocedural states: Secondary | ICD-10-CM

## 2023-04-20 DIAGNOSIS — Z5181 Encounter for therapeutic drug level monitoring: Secondary | ICD-10-CM | POA: Diagnosis not present

## 2023-04-20 DIAGNOSIS — I71019 Dissection of thoracic aorta, unspecified: Secondary | ICD-10-CM | POA: Diagnosis not present

## 2023-04-20 LAB — POCT INR: INR: 2.5 (ref 2.0–3.0)

## 2023-04-20 NOTE — Patient Instructions (Addendum)
Description   Continue taking Warfarin 1/2 tablet daily except for 1 tablet on Mondays, Wednesdays, and Fridays.  Recheck INR in 6 weeks.  [facility] [phone]

## 2023-05-08 DIAGNOSIS — H526 Other disorders of refraction: Secondary | ICD-10-CM | POA: Diagnosis not present

## 2023-05-08 DIAGNOSIS — D3132 Benign neoplasm of left choroid: Secondary | ICD-10-CM | POA: Diagnosis not present

## 2023-05-08 DIAGNOSIS — H26491 Other secondary cataract, right eye: Secondary | ICD-10-CM | POA: Diagnosis not present

## 2023-05-08 DIAGNOSIS — H43813 Vitreous degeneration, bilateral: Secondary | ICD-10-CM | POA: Diagnosis not present

## 2023-05-08 DIAGNOSIS — H35363 Drusen (degenerative) of macula, bilateral: Secondary | ICD-10-CM | POA: Diagnosis not present

## 2023-05-08 DIAGNOSIS — Z961 Presence of intraocular lens: Secondary | ICD-10-CM | POA: Diagnosis not present

## 2023-05-08 DIAGNOSIS — H02403 Unspecified ptosis of bilateral eyelids: Secondary | ICD-10-CM | POA: Diagnosis not present

## 2023-05-08 DIAGNOSIS — H04123 Dry eye syndrome of bilateral lacrimal glands: Secondary | ICD-10-CM | POA: Diagnosis not present

## 2023-05-31 NOTE — Progress Notes (Unsigned)
 Cardiology Clinic Note   Patient Name: Patrick Bell Date of Encounter: 06/02/2023  Primary Care Provider:  Dettinger, Elige Radon, MD Primary Cardiologist:  Little Ishikawa, MD  Patient Profile    Patrick Bell 83 year old male presents to the clinic today for follow-up evaluation of his essential hypertension and atrial fibrillation.  Past Medical History    Past Medical History:  Diagnosis Date   Acute thoracic aortic dissection (HCC) 03/11/2020   Type A   Allergy    Asthma    Cataract    Cataract    Diverticulitis    Dyspnea    GERD (gastroesophageal reflux disease)    Heart murmur 12/2019   Hyperlipidemia    Hypertension    Inguinal hernia bilateral, non-recurrent    S/P aortic dissection repair 03/11/2020   supracoronary straight graft repair with resuspension of native aortic valve and open distal anastomosis for intraoperative acute type A aortic dissection    S/P mitral valve repair 03/11/2020   Complex valvuloplasty including artificial Gore-tex neochord placement x 4, suture plication of posterior leaflet and posteromedial commissure and 32 mm Sorin Memo 4D ring annuloplasty   Sleep apnea    Past Surgical History:  Procedure Laterality Date   bilateral inguinal hernia     CARDIAC CATHETERIZATION Bilateral 02/21/2020   CHEST TUBE INSERTION N/A 03/22/2020   Procedure: CHEST TUBE INSERTION OF 28 BLAKE DRAIN.;  Surgeon: Delight Ovens, MD;  Location: [facility] OR;  Service: Thoracic;  Laterality: N/A;   COLONOSCOPY  06/09/2004   [id number] colon diverticulosis, otherwise normal colonoscopy   EYE SURGERY  08/2014   HERNIA REPAIR     MITRAL VALVE REPAIR N/A 03/11/2020   Procedure: MITRAL VALVE REPAIR (MVR) USING MEMO 4D SIZE 32 RING;  Surgeon: Purcell Nails, MD;  Location: [facility] OR;  Service: Open Heart Surgery;  Laterality: N/A;   PALATE / UVULA BIOPSY / EXCISION     PERICARDIAL WINDOW N/A 03/22/2020   Procedure: SUBXYPHOID POST-OP PERICARDIAL FLUID  DRAINAGE WITH CHEST TUBE INSERTION.;  Surgeon: Delight Ovens, MD;  Location: [facility] OR;  Service: Thoracic;  Laterality: N/A;   REPAIR OF ACUTE ASCENDING THORACIC AORTIC DISSECTION N/A 03/11/2020   Procedure: REPAIR OF ACUTE ASCENDING THORACIC AORTIC DISSECTION USING A HEMASHIELD PLATINUM STRAIGHT GRAFT AND A HEASHIELD PLATINUM 28X10MM SINGLE ARM GRAFT;  Surgeon: Purcell Nails, MD;  Location: [facility] OR;  Service: Open Heart Surgery;  Laterality: N/A;   RIGHT/LEFT HEART CATH AND CORONARY ANGIOGRAPHY N/A 02/21/2020   Procedure: RIGHT/LEFT HEART CATH AND CORONARY ANGIOGRAPHY;  Surgeon: Lennette Bihari, MD;  Location: [facility] INVASIVE CV LAB;  Service: Cardiovascular;  Laterality: N/A;   TEE WITHOUT CARDIOVERSION N/A 12/19/2019   Procedure: TRANSESOPHAGEAL ECHOCARDIOGRAM (TEE);  Surgeon: Jodelle Red, MD;  Location: [facility] ENDOSCOPY;  Service: Cardiovascular;  Laterality: N/A;   TEE WITHOUT CARDIOVERSION N/A 03/11/2020   Procedure: TRANSESOPHAGEAL ECHOCARDIOGRAM (TEE);  Surgeon: Purcell Nails, MD;  Location: [facility] OR;  Service: Open Heart Surgery;  Laterality: N/A;   TEE WITHOUT CARDIOVERSION N/A 03/22/2020   Procedure: TRANSESOPHAGEAL ECHOCARDIOGRAM (TEE);  Surgeon: Delight Ovens, MD;  Location: [facility] OR;  Service: Thoracic;  Laterality: N/A;   TONSILLECTOMY     VASECTOMY      Allergies  No Known Allergies  History of Present Illness    Patrick Bell has a PMH of HTN, HLD, asthma, OSA, mitral valve regurgitation status post mitral valve repair by Dr. Cornelius Moras 03/11/2020.  His procedure was complicated  by type a aortic dissection requiring graft repair of his aorta and resuspension of his aortic valve.     He followed up with Corine Shelter, PA-C via telemedicine 1/22.  At that time his blood pressure was well-controlled.  His creatinine was stable as well as his anemia with a hemoglobin of 8.8.  He was seen in follow-up last by Dr. Tresa Endo on 03/09/2022.  He denied exertional chest pain.  He did  note 2 episodes of short-lived mild chest sensation while sitting in a chair.  Episodes were nonexertional.  He admitted to mild dizziness.  His blood pressure remained stable.  He underwent follow-up sleep study by Dr. York Spaniel and CPAP pressure of 11 cm was recommended.  He contacted the nurse triage line on 07/25/2022.  He reported that over the previous month he had noted increased shortness of breath with moving around and he denied lower extremity swelling as well as weight gain.  He reported losing around 10 pounds over the course of a month.  His blood pressure was 127/70 with a heart rate of 76.     He presented to the clinic 07/26/22 for follow-up evaluation and stated over the last several weeks he had noticed mild left chest discomfort, dizziness, a.m. nosebleeds, occasional dizziness and 10 pound weight loss.  He had poor appetite.  He noticed that his symptoms started after attending a funeral.  He was also not  as active due to back pain.  We reviewed his echocardiogram 04/15/2021 and aortic dissection.  I  ordered CBC, BMP, echocardiogram for further evaluation and plan follow-up in 2 to 3 months.  His follow-up echocardiogram 09/30/2022 showed normal LV function and intermediate diastolic parameters.  He was noted to have trivial mitral valve regurgitation and his aortic root was noted to be 39 mm.  Plan was made to repeat in 12 months.  He presented to the clinic 8/24 for follow-up evaluation and stated he had been noticing dizziness.  He brought in a blood pressure log which showed blood pressures in the 120-80s systolic over 70s-60s.  He presented to his PCP and was asked about amlodipine reduction.  PCP recommended following up with cardiology for recommendations.  His hydration has been about 30 ounces daily.  We reviewed the importance of increasing hydration and physical activity.  I decreased his amlodipine to 5 mg daily, asked him to increase his p.o. hydration,  increase his physical  activity as tolerated and planned follow-up in 6 to 9 months.  He was seen in follow-up by Dr. Laneta Simmers 04/03/2023.  He presented with his wife.  He was limited in his physical activity due to chronic back pain.  He denied chest pain and shortness of breath.  He presents to the clinic today for follow-up evaluation and states he has continued to do well.  He does note some dizziness occasionally.  Episode lasted for around 30 minutes.  When asked about his hydration he reports that he does not do well with this.  He reports drinking about 1 bottle of water per day.  His blood pressure today is well-controlled.  He remains stable from a cardiac standpoint.  I recommended that he drink around 50 ounces of water per day and wear lower extremity support stockings.  I will continue his current medication regimen and plan follow-up in around 6 months.  Today he denies chest pain, shortness of breath, lower extremity edema, palpitations, melena, hematuria, hemoptysis, diaphoresis, weakness, presyncope, syncope, orthopnea, and PND.  Home Medications    Prior to Admission medications   Medication Sig Start Date End Date Taking? Authorizing Provider  acetaminophen (TYLENOL) 325 MG tablet Take 1-2 tablets (325-650 mg total) by mouth every 4 (four) hours as needed for mild pain. Patient taking differently: Take 325-650 mg by mouth every 4 (four) hours as needed for mild pain. Taking 1000mg  bid 04/06/20   Angiulli, Mcarthur Rossetti, PA-C  albuterol (VENTOLIN HFA) 108 (90 Base) MCG/ACT inhaler INHALE 2 PUFFS EVERY 6 HOURS AS NEEDED FOR WHEEZING OR SHORTNESS OF BREATH 08/25/21   Dettinger, Elige Radon, MD  amLODipine (NORVASC) 10 MG tablet Take 1 tablet (10 mg total) by mouth daily. 03/09/22   Lennette Bihari, MD  guaiFENesin (MUCINEX) 600 MG 12 hr tablet Take 600 mg by mouth every 4 (four) hours as needed for cough or to loosen phlegm.    [provider]  ipratropium (ATROVENT) 0.03 % nasal spray  10/21/21   [provider]  levothyroxine (SYNTHROID) 50 MCG tablet Take 1 tablet (50 mcg total) by mouth daily. 07/07/22   Dettinger, Elige Radon, MD  methocarbamol (ROBAXIN) 500 MG tablet Take 1 tablet (500 mg total) by mouth 4 (four) times daily. 06/24/22   Bennie Pierini, FNP  metoprolol succinate (TOPROL-XL) 50 MG 24 hr tablet TAKE ONE TABLET AT BEDTIME 06/10/22   Duke, Roe Rutherford, PA  montelukast (SINGULAIR) 10 MG tablet Take 1 tablet (10 mg total) by mouth daily. 07/07/22   Dettinger, Elige Radon, MD  Olopatadine HCl 0.2 % SOLN Place 1 drop into both eyes daily as needed (allergies).    [provider]  pantoprazole (PROTONIX) 40 MG tablet TAKE 1 TABLET DAILY 07/07/22   Dettinger, Elige Radon, MD  rosuvastatin (CRESTOR) 20 MG tablet Take 1 tablet (20 mg total) by mouth daily. 03/09/22   Lennette Bihari, MD  traMADol (ULTRAM) 50 MG tablet Take 1 tablet (50 mg total) by mouth every 12 (twelve) hours as needed. 07/07/22   Dettinger, Elige Radon, MD  warfarin (COUMADIN) 5 MG tablet TAKE ONE TABLET ONCE DAILY AT 4PM 07/08/21   Dettinger, Elige Radon, MD    Family History    Family History  Problem Relation Age of Onset   Heart disease Mother    Hip fracture Mother 40   Osteoporosis Mother    Dementia Mother 36   Arthritis Father    Heart disease Father    Arthritis Sister    Arthritis Brother    Colon cancer Neg Hx    He indicated that his mother is deceased. He indicated that his father is deceased. He indicated that his sister is alive. He indicated that his brother is alive. He indicated that the status of his neg hx is unknown.  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  Tobacco Use   Smoking status: Former    Current packs/day:  0.00    Average packs/day: 4.0 packs/day for 25.0 years (100.0 ttl pk-yrs)    Types: Cigarettes    Start date: 06/22/1957    Quit date: 06/23/1982    Years since quitting: 40.9   Smokeless tobacco: Never   Tobacco comments:    pt  was a truck Psychiatric nurse   Vaping status: Never Used  Substance and Sexual Activity   Alcohol use: Yes    Alcohol/week: 5.0 standard drinks of alcohol    Types: 5 Shots of liquor per week    Comment: brandy a few times a month, beer once a month   Drug use: No   Sexual activity: Yes    Birth control/protection: Surgical  Other Topics Concern   Not on file  Social History Narrative   Not on file   Social Drivers of Health   Financial Resource Strain: Low Risk  (01/11/2023)   Overall Financial Resource Strain (CARDIA)    Difficulty of Paying Living Expenses: Not hard at all  Food Insecurity: No Food Insecurity (01/11/2023)   Hunger Vital Sign    Worried About Running Out of Food in the Last Year: Never true    Ran Out of Food in the Last Year: Never true  Transportation Needs: No Transportation Needs (01/11/2023)   PRAPARE - Administrator, Civil Service (Medical): No    Lack of Transportation (Non-Medical): No  Physical Activity: Insufficiently Active (01/11/2023)   Exercise Vital Sign    Days of Exercise per Week: 3 days    Minutes of Exercise per Session: 30 min  Stress: No Stress Concern Present (01/11/2023)   [employer] of Occupational Health - Occupational Stress Questionnaire    Feeling of Stress : Not at all  Social Connections: Moderately Integrated (01/11/2023)   Social Connection and Isolation Panel [NHANES]    Frequency of Communication with Friends and Family: More than three times a week    Frequency of Social Gatherings with Friends and Family: More than three times a week    Attends Religious Services: More than 4 times per year    Active Member of [employer] or Organizations: No    Attends Banker  Meetings: Never    Marital Status: Married  Catering manager Violence: Not At Risk (01/11/2023)   Humiliation, Afraid, Rape, and Kick questionnaire    Fear of Current or Ex-Partner: No    Emotionally Abused: No    Physically Abused: No    Sexually Abused: No     Review of Systems    General:  No chills, fever, night sweats or weight changes.  Cardiovascular:  No chest pain, dyspnea on exertion, edema, orthopnea, palpitations, paroxysmal nocturnal dyspnea. Dermatological: No rash, lesions/masses Respiratory: No cough, dyspnea Urologic: No hematuria, dysuria Abdominal:   No nausea, vomiting, diarrhea, bright red blood per rectum, melena, or hematemesis Neurologic:  No visual changes, wkns, changes in mental status. All other systems reviewed and are otherwise negative except as noted above.  Physical Exam    VS:  BP 126/62 (BP Location: Right Arm, Patient Position: Sitting, Cuff Size: Normal)   Pulse 65   Ht 5\' 9"  (1.753 m)   Wt 182 lb (82.6 kg)   SpO2 96%   BMI 26.88 kg/m  , BMI Body mass index is 26.88 kg/m. GEN: Well nourished, well developed, in no acute distress. HEENT: normal. Neck: Supple, no JVD, carotid bruits, or masses. Cardiac: RRR, 2/6 systolic murmur heard along left sternal border.  Murmurs, rubs, or gallops. No clubbing, cyanosis, slight right greater than left nonpitting edema.  Radials/DP/PT 2+ and equal bilaterally.  Respiratory:  Respirations regular and unlabored, clear to auscultation bilaterally. GI: Soft, nontender, nondistended, BS + x 4. MS: no deformity or atrophy. Skin: warm and dry, no rash. Neuro:  Strength and sensation are intact. Psych: Normal affect.  Accessory Clinical Findings    Recent Labs: 03/24/2023: ALT 13; BUN 16; Creatinine, Ser 1.07; Hemoglobin 15.5; Platelets 184; Potassium 4.6; Sodium 143; TSH 1.360   Recent Lipid Panel    Component Value Date/Time   CHOL 150 03/24/2023 1123   CHOL 168 06/18/2012 1031   TRIG 75 03/24/2023  1123   TRIG 101 01/14/2013 1218   TRIG 102 06/18/2012 1031   HDL 69 03/24/2023 1123   HDL 65 01/14/2013 1218   HDL 62 06/18/2012 1031   CHOLHDL 2.2 03/24/2023 1123   LDLCALC 66 03/24/2023 1123   LDLCALC 99 01/14/2013 1218   LDLCALC 86 06/18/2012 1031         ECG personally reviewed by me today-none today.  EKG 07/26/2022 normal sinus rhythm with occasional premature ventricular complexes 77 bpm- No acute changes  CT chest/abdomen/pelvis 03/17/2022  CTA CHEST FINDINGS   Cardiovascular: Repair of the ascending thoracic aortic dissection with a graft. The graft is widely patent. Again noted is a dissection involving the aortic arch and extending into the descending thoracic aorta. Aortic arch on sequence 6, image 50 measures approximately 4.5 cm and previously measured 4.4 cm. Great vessels remain patent. Again noted is a dissection extending into the left subclavian artery. Left vertebral artery is patent. Left subclavian artery dissection does not extend into the left axillary artery. Proximal common carotid arteries are patent bilaterally. Evidence for small right vertebral artery. Proximal descending thoracic aorta measures 4.3 cm and stable. Mid descending thoracic aorta at the level of the main pulmonary artery measures 3.4 cm and stable. Distal descending thoracic aorta measures 2.6 cm and stable. Aortic dissection extends into the abdominal aorta. Stable appearance of the heart with previous mitral valve repair.   Mediastinum/Nodes: No lymph node enlargement in the mediastinum or hila. Thyroid tissue is unremarkable. No axillary lymph node enlargement. Esophagus is unremarkable.   Lungs/Pleura: Trachea and mainstem bronchi are patent. Stable elongated nodular density in the right middle lobe on sequence 8, image 116 measures up to 7 mm. Stable tiny nodular density in the right upper lobe on sequence 8, image 25. No airspace disease or consolidation in the lungs.  Stable branching densities in the periphery of the left upper lobe on sequence 8, image 64. No large pleural effusions. Stable subtle nodular density in the left upper lobe on sequence 8, image 28.   Musculoskeletal: No acute bone abnormality.   Review of the MIP images confirms the above findings.   CTA ABDOMEN AND PELVIS FINDINGS   VASCULAR   Aorta: Aortic dissection involving the entire abdominal aorta that extends into the iliac arteries bilaterally. Configuration of the abdominal aorta and dissection has not significantly changed. Supra celiac abdominal aorta measures 2.9 cm and stable. True lumen is along the left side of the proximal abdominal aorta.   Celiac: Celiac trunk originates from the true lumen. Celiac trunk is patent without aneurysm or dissection. No significant stenosis.   SMA: Dissection involving the origin the SMA but the flow appears to be predominantly from the true lumen. No significant stenosis or aneurysm involving the SMA.   Renals: Dissection involves the proximal right renal artery. Dissection most likely involves the proximal left renal artery. This configuration is unchanged. Bilateral renal arteries are patent without significant stenosis or aneurysm.   IMA: Originates from the false lumen and patent without aneurysm or significant stenosis.   Inflow: Dissection extends into the common iliac arteries bilaterally. Dissection  extends into the right external iliac artery and terminates in the mid/distal aspect and stable from the previous examination. Left iliac dissection extends into the distal left external iliac artery and terminates in the proximal left common femoral artery region. Configuration and extent of the left iliac artery dissection has not significantly changed. Bilateral internal iliac arteries are patent and originating from the true lumens. Proximal femoral arteries are patent bilaterally.   Veins: No obvious venous  abnormality within the limitations of this arterial phase study.   Review of the MIP images confirms the above findings.   NON-VASCULAR   Hepatobiliary: Subtle hypodensity in the posterior right hepatic lobe on sequence 11, image 72 is nonspecific and likely an incidental finding. No biliary dilatation. Portal venous system is patent. Normal appearance of the gallbladder.   Pancreas: Unremarkable. No pancreatic ductal dilatation or surrounding inflammatory changes.   Spleen: Normal in size without focal abnormality.   Adrenals/Urinary Tract: Normal adrenal glands. Stable appearance of both kidneys without hydronephrosis. Probable left renal sinus cyst which is similar to the previous examination. Evidence for small cortical renal cysts. These cysts do not require dedicated follow-up. No suspicious renal lesions. Normal appearance of the urinary bladder.   Stomach/Bowel: Normal appearance of the stomach and small bowel. Colonic diverticulosis particularly in the sigmoid colon. Chronic densities along the origin of the left inguinal canal related to previous hernia repair. No evidence for acute colonic or bowel inflammation. Normal appendix without inflammatory changes. There is small bowel near the origin of the right inguinal canal.   Lymphatic: No lymph node enlargement in the abdomen or pelvis.   Reproductive: Prostate is upper limits of normal and stable the previous examination.   Other: Negative for free fluid. Negative for free air. Evidence for bilateral inguinal hernia repairs.   Musculoskeletal: Multilevel degenerative disc and facet disease in lumbar spine. No acute bone abnormality.   Review of the MIP images confirms the above findings.   IMPRESSION: 1. Stable appearance of the residual dissection involving the aortic arch, descending thoracic aorta, abdominal aorta and bilateral iliac arteries. No significant change in the size of the thoracic or abdominal  aorta. Stable appearance of the surgically repaired ascending thoracic aorta. 2. No acute abnormality involving the chest, abdomen or pelvis. 3. Colonic diverticulosis without acute inflammation.     Electronically Signed   By: Richarda Overlie M.D.   On: 03/18/2022 10:52  Echocardiogram 04/15/2021 1. Left ventricular ejection fraction, by estimation, is 60 to 65%. Left  ventricular ejection fraction by 3D volume is 63 %. The left ventricle has  normal function. The left ventricle has no regional wall motion  abnormalities. There is mild asymmetric  left ventricular hypertrophy. Left ventricular diastolic parameters are  consistent with Grade I diastolic dysfunction (impaired relaxation).   2. Right ventricular systolic function is moderately reduced. The right  ventricular size is normal. There is normal pulmonary artery systolic  pressure.   3. Left atrial size was moderately dilated.   4. Right atrial size was mildly dilated.   5. The mitral valve has been repaired/replaced. Mild mitral valve  regurgitation.   6. The aortic valve is grossly normal. Aortic valve regurgitation is  mild.   Echocardiogram 09/30/2022  IMPRESSIONS     1. Left ventricular ejection fraction, by estimation, is 55%. The left  ventricle has normal function. The left ventricle has no regional wall  motion abnormalities. Left ventricular diastolic parameters are  indeterminate.   2. Right ventricular systolic function  is mildly reduced. The right  ventricular size is normal.   3. Left atrial size was moderately dilated.   4. The mitral valve has been repaired/replaced. Trivial mitral valve  regurgitation. No evidence of mitral stenosis. The mean mitral valve  gradient is 3.3 mmHg.   5. The aortic valve is tricuspid. There is mild calcification of the  aortic valve. Aortic valve regurgitation is mild. Aortic valve  sclerosis/calcification is present, without any evidence of aortic  stenosis.   6. Aortic  dilatation noted. There is borderline dilatation of the aortic  root, measuring 39 mm.   7. The inferior vena cava is normal in size with greater than 50%  respiratory variability, suggesting right atrial pressure of 3 mmHg.   FINDINGS   Left Ventricle: Left ventricular ejection fraction, by estimation, is  55%. The left ventricle has normal function. The left ventricle has no  regional wall motion abnormalities. The left ventricular internal cavity  size was normal in size. There is no  left ventricular hypertrophy. Abnormal (paradoxical) septal motion  consistent with post-operative status. Left ventricular diastolic  parameters are indeterminate.   Right Ventricle: The right ventricular size is normal. No increase in  right ventricular wall thickness. Right ventricular systolic function is  mildly reduced.   Left Atrium: Left atrial size was moderately dilated.   Right Atrium: Right atrial size was normal in size.   Pericardium: There is no evidence of pericardial effusion.   Mitral Valve: The mitral valve has been repaired/replaced. Trivial mitral  valve regurgitation. There is a prosthetic annuloplasty ring present in  the mitral position. No evidence of mitral valve stenosis. MV peak  gradient, 7.3 mmHg. The mean mitral valve   gradient is 3.3 mmHg.   Tricuspid Valve: The tricuspid valve is normal in structure. Tricuspid  valve regurgitation is mild . No evidence of tricuspid stenosis.   Aortic Valve: The aortic valve is tricuspid. There is mild calcification  of the aortic valve. Aortic valve regurgitation is mild. Aortic valve  sclerosis/calcification is present, without any evidence of aortic  stenosis. Aortic valve mean gradient  measures 2.0 mmHg. Aortic valve peak gradient measures 4.0 mmHg. Aortic  valve area, by VTI measures 3.63 cm.   Pulmonic Valve: The pulmonic valve was normal in structure. Pulmonic valve  regurgitation is mild. No evidence of pulmonic  stenosis.   Aorta: Aortic dilatation noted. There is borderline dilatation of the  aortic root, measuring 39 mm.   Venous: The inferior vena cava is normal in size with greater than 50%  respiratory variability, suggesting right atrial pressure of 3 mmHg.   IAS/Shunts: No atrial level shunt detected by color flow Doppler.    Assessment & Plan   1. Paroxysmal atrial fibrillation-heart rate today 65 bpm.  Continues to be compliant with Coumadin.  Denies bleeding issues.   Continue Coumadin, metoprolol Avoid triggers caffeine, chocolate, EtOH, dehydration etc.-reviewed   Dizziness-resolved.  Denies episodes of lightheadedness, presyncope or syncope.  Previously reduced amlodipine and asked him to increase his p.o. hydration.   Maintain hydration Change positions slowly May use lower extremity support stockings  Shortness of breath/DOE-breathing at baseline.  Echocardiogram 09/30/2022 showed an LVEF of 55%, intermediate diastolic parameters, moderately dilated left atria replaced/repaired mitral valve with trivial mitral valve regurgitation and no evidence of mitral valve stenosis, mild aortic valve regurgitation, and aortic dilation measuring 39 mm.   Increase physical activity as tolerated Heart healthy low-sodium diet  Essential hypertension-BP today 126/62.   Maintain blood  pressure log Continue metoprolol Continue amlodipine to 5 mg daily Low-sodium diet  Aortic dissection-status post graft repair.  Denies episodes of back and chest discomfort. CTA chest/abdomen/pelvis showed widely patent graft/stable dissection with no significant changes.  Repeat echocardiogram showed borderline aortic root measuring 39 mm. Maintain good blood pressure control Repeat chest abdomen pelvis CT 2025 Follows with Dr. Bill Salinas  Mitral valve prolapse-status post mitral valve repair.  Echocardiogram 09/30/2022 showed replaced/repaired mitral valve with trivial mitral valve regurgitation.  Peak  gradient 7.3 mmHg.  Mean gradient 3.3 mmHg. Plan for repeat echocardiogram 6/25.  Disposition: Follow-up with Dr. Bjorn Pippin or me in 6 months.   Thomasene Ripple. Zaidee Rion NP-C     06/02/2023, 12:08 PM [facility] [address] Office [phone] Fax [phone]    I spent 14 minutes examining this patient, reviewing medications, and using patient centered shared decision making involving her cardiac care.  Prior to her visit I spent greater than 20 minutes reviewing her past medical history,  medications, and prior cardiac tests.

## 2023-06-02 ENCOUNTER — Encounter: Payer: Self-pay | Admitting: General Practice

## 2023-06-02 ENCOUNTER — Ambulatory Visit: Payer: Medicare Other

## 2023-06-02 ENCOUNTER — Ambulatory Visit: Payer: Medicare Other | Attending: General Practice | Admitting: General Practice

## 2023-06-02 VITALS — BP 126/62 | HR 65 | Ht 69.0 in | Wt 182.0 lb

## 2023-06-02 DIAGNOSIS — R0602 Shortness of breath: Secondary | ICD-10-CM | POA: Diagnosis not present

## 2023-06-02 DIAGNOSIS — I71019 Dissection of thoracic aorta, unspecified: Secondary | ICD-10-CM | POA: Diagnosis not present

## 2023-06-02 DIAGNOSIS — I480 Paroxysmal atrial fibrillation: Secondary | ICD-10-CM

## 2023-06-02 DIAGNOSIS — I482 Chronic atrial fibrillation, unspecified: Secondary | ICD-10-CM | POA: Diagnosis not present

## 2023-06-02 DIAGNOSIS — Z9889 Other specified postprocedural states: Secondary | ICD-10-CM

## 2023-06-02 DIAGNOSIS — I10 Essential (primary) hypertension: Secondary | ICD-10-CM | POA: Diagnosis not present

## 2023-06-02 DIAGNOSIS — Z5181 Encounter for therapeutic drug level monitoring: Secondary | ICD-10-CM

## 2023-06-02 DIAGNOSIS — R42 Dizziness and giddiness: Secondary | ICD-10-CM

## 2023-06-02 LAB — POCT INR: INR: 2.8 (ref 2.0–3.0)

## 2023-06-02 NOTE — Patient Instructions (Signed)
  Medication Instructions:  The current medical regimen is effective;  continue present plan and medications as directed. Please refer to the Current Medication list given to you today.  *If you need a refill on your cardiac medications before your next appointment, please call your pharmacy*  Lab Work: NONE  Testing/Procedures: NONE  Follow-Up: At [facility], you and your health needs are our priority.  As part of our continuing mission to provide you with exceptional heart care, we have created designated Provider Care Teams.  These Care Teams include your primary Cardiologist (physician) and Advanced Practice Providers (APPs -  Physician Assistants and Nurse Practitioners) who all work together to provide you with the care you need, when you need it.  Your next appointment:   6 month(s)  Provider:   Little Ishikawa, MD     Other Instructions PLEASE PURCHASE AND WEAR COMPRESSION STOCKINGS-SEE ATTACHED INCREASE HYDRATION 50 OUNCES     s

## 2023-06-02 NOTE — Patient Instructions (Signed)
 Continue taking Warfarin 1/2 tablet daily except for 1 tablet on Mondays, Wednesdays, and Fridays.  Recheck INR in 6 weeks.  Coumadin Clinic [phone]

## 2023-06-08 DIAGNOSIS — L430 Hypertrophic lichen planus: Secondary | ICD-10-CM | POA: Diagnosis not present

## 2023-06-08 DIAGNOSIS — D485 Neoplasm of uncertain behavior of skin: Secondary | ICD-10-CM | POA: Diagnosis not present

## 2023-06-08 DIAGNOSIS — D044 Carcinoma in situ of skin of scalp and neck: Secondary | ICD-10-CM | POA: Diagnosis not present

## 2023-06-08 DIAGNOSIS — L905 Scar conditions and fibrosis of skin: Secondary | ICD-10-CM | POA: Diagnosis not present

## 2023-06-08 DIAGNOSIS — Z85828 Personal history of other malignant neoplasm of skin: Secondary | ICD-10-CM | POA: Diagnosis not present

## 2023-06-08 DIAGNOSIS — L570 Actinic keratosis: Secondary | ICD-10-CM | POA: Diagnosis not present

## 2023-06-12 DIAGNOSIS — H04123 Dry eye syndrome of bilateral lacrimal glands: Secondary | ICD-10-CM | POA: Diagnosis not present

## 2023-07-14 ENCOUNTER — Ambulatory Visit: Payer: Medicare Other | Attending: Cardiovascular Disease

## 2023-07-14 DIAGNOSIS — Z9889 Other specified postprocedural states: Secondary | ICD-10-CM | POA: Diagnosis not present

## 2023-07-14 DIAGNOSIS — Z5181 Encounter for therapeutic drug level monitoring: Secondary | ICD-10-CM

## 2023-07-14 DIAGNOSIS — I71019 Dissection of thoracic aorta, unspecified: Secondary | ICD-10-CM | POA: Diagnosis not present

## 2023-07-14 LAB — POCT INR: INR: 2.2 (ref 2.0–3.0)

## 2023-07-14 NOTE — Patient Instructions (Signed)
 Continue taking Warfarin 1/2 tablet daily except for 1 tablet on Mondays, Wednesdays, and Fridays.  Recheck INR in 6 weeks.  Coumadin Clinic [phone]

## 2023-07-18 ENCOUNTER — Other Ambulatory Visit: Payer: Self-pay | Admitting: Family Medicine

## 2023-07-24 ENCOUNTER — Ambulatory Visit (INDEPENDENT_AMBULATORY_CARE_PROVIDER_SITE_OTHER): Payer: Medicare Other | Admitting: Family Medicine

## 2023-07-24 ENCOUNTER — Encounter: Payer: Self-pay | Admitting: Family Medicine

## 2023-07-24 VITALS — BP 133/73 | HR 71 | Ht 69.0 in | Wt 182.0 lb

## 2023-07-24 DIAGNOSIS — K219 Gastro-esophageal reflux disease without esophagitis: Secondary | ICD-10-CM | POA: Diagnosis not present

## 2023-07-24 DIAGNOSIS — E7800 Pure hypercholesterolemia, unspecified: Secondary | ICD-10-CM | POA: Diagnosis not present

## 2023-07-24 DIAGNOSIS — E039 Hypothyroidism, unspecified: Secondary | ICD-10-CM | POA: Diagnosis not present

## 2023-07-24 DIAGNOSIS — I10 Essential (primary) hypertension: Secondary | ICD-10-CM

## 2023-07-24 LAB — LIPID PANEL

## 2023-07-24 MED ORDER — MONTELUKAST SODIUM 10 MG PO TABS: 10.0000 mg | ORAL_TABLET | Freq: Every day | ORAL | 3 refills | Status: AC

## 2023-07-24 MED ORDER — METHOCARBAMOL 500 MG PO TABS: 500.0000 mg | ORAL_TABLET | Freq: Every day | ORAL | 1 refills | Status: AC | PRN

## 2023-07-24 MED ORDER — PANTOPRAZOLE SODIUM 40 MG PO TBEC: DELAYED_RELEASE_TABLET | ORAL | 3 refills | Status: DC

## 2023-07-24 NOTE — Progress Notes (Signed)
 BP 133/73   Pulse 71   Ht 5\' 9"  (1.753 m)   Wt 182 lb (82.6 kg)   SpO2 97%   BMI 26.88 kg/m    Subjective:   Patient ID: Patrick Bell, male    DOB: 02-05-1941, 83 y.o.   MRN: [id number]  HPI: Patrick Bell is a 83 y.o. male presenting on 07/24/2023 for Medical Management of Chronic Issues, Hyperlipidemia, Hypertension, and Dizziness   HPI Hypertension Patient is currently on amlodipine  and metoprolol , and their blood pressure today is 133/73. Patient still has the occasional intermittent lightheadedness and dizziness that happens, sometimes it will happen on certain days more than others.  He thinks his blood pressure does run a little lower in the 120s on those times.  He says it does not run lower than that though.. Patient denies headaches, blurred vision, chest pains, shortness of breath, or weakness. Denies any side effects from medication and is content with current medication.   Hyperlipidemia Patient is coming in for recheck of his hyperlipidemia. The patient is currently taking Crestor . They deny any issues with myalgias or history of liver damage from it. They deny any focal numbness or weakness or chest pain.   Hypothyroidism recheck Patient is coming in for thyroid  recheck today as well. They deny any issues with hair changes or heat or cold problems or diarrhea or constipation. They deny any chest pain or palpitations. They are currently on levothyroxine  50 micrograms   GERD Patient is currently on pantoprazole .  She denies any major symptoms or abdominal pain or belching or burping. She denies any blood in her stool or lightheadedness.   Relevant past medical, surgical, family and social history reviewed and updated as indicated. Interim medical history since our last visit reviewed. Allergies and medications reviewed and updated.  Review of Systems  Constitutional:  Negative for chills and fever.  HENT:  Negative for congestion.   Eyes:  Negative for discharge.   Respiratory:  Negative for cough, shortness of breath and wheezing.   Cardiovascular:  Negative for chest pain and leg swelling.  Musculoskeletal:  Negative for back pain and gait problem.  Skin:  Negative for rash.  Neurological:  Positive for dizziness and light-headedness.  All other systems reviewed and are negative.   Per HPI unless specifically indicated above   Allergies as of 07/24/2023   No Known Allergies      Medication List        Accurate as of July 24, 2023 10:47 AM. If you have any questions, ask your nurse or doctor.          acetaminophen  325 MG tablet Commonly known as: TYLENOL  Take 1-2 tablets (325-650 mg total) by mouth every 4 (four) hours as needed for mild pain. What changed:  how much to take when to take this additional instructions   albuterol  108 (90 Base) MCG/ACT inhaler Commonly known as: VENTOLIN  HFA INHALE 2 PUFFS EVERY 6 HOURS AS NEEDED FOR WHEEZING OR SHORTNESS OF BREATH   amLODipine  5 MG tablet Commonly known as: NORVASC  Take 1 tablet (5 mg total) by mouth daily.   guaiFENesin  600 MG 12 hr tablet Commonly known as: MUCINEX  Take 600 mg by mouth every 4 (four) hours as needed for cough or to loosen phlegm.   levothyroxine  50 MCG tablet Commonly known as: SYNTHROID  TAKE ONE TABLET BY MOUTH DAILY   methocarbamol  500 MG tablet Commonly known as: ROBAXIN  Take 1 tablet (500 mg total) by mouth daily as  needed for muscle spasms. What changed: when to take this Changed by: Lucio Sabin Veretta Sabourin   metoprolol  succinate 50 MG 24 hr tablet Commonly known as: TOPROL -XL TAKE ONE TABLET AT BEDTIME   montelukast  10 MG tablet Commonly known as: SINGULAIR  Take 1 tablet (10 mg total) by mouth daily.   Olopatadine  HCl 0.2 % Soln Place 1 drop into both eyes daily as needed (allergies).   oxyCODONE -acetaminophen  5-325 MG tablet Commonly known as: Percocet Take 1-2 tablets by mouth every 6 (six) hours as needed.   pantoprazole  40 MG  tablet Commonly known as: PROTONIX  TAKE 1 TABLET DAILY   rosuvastatin  20 MG tablet Commonly known as: CRESTOR  TAKE ONE TABLET ONCE DAILY   warfarin 5 MG tablet Commonly known as: COUMADIN  Take as directed by the anticoagulation clinic. If you are unsure how to take this medication, talk to your nurse or doctor. Original instructions: TAKE ONE TABLET ONCE DAILY AT 4PM         Objective:   BP 133/73   Pulse 71   Ht 5\' 9"  (1.753 m)   Wt 182 lb (82.6 kg)   SpO2 97%   BMI 26.88 kg/m   Wt Readings from Last 3 Encounters:  07/24/23 182 lb (82.6 kg)  06/02/23 182 lb (82.6 kg)  04/03/23 182 lb (82.6 kg)    Physical Exam Vitals and nursing note reviewed.  Constitutional:      General: He is not in acute distress.    Appearance: He is well-developed. He is not diaphoretic.  Eyes:     General: No scleral icterus.    Conjunctiva/sclera: Conjunctivae normal.  Neck:     Thyroid : No thyromegaly.  Cardiovascular:     Rate and Rhythm: Normal rate and regular rhythm.     Heart sounds: Normal heart sounds. No murmur heard. Pulmonary:     Effort: Pulmonary effort is normal. No respiratory distress.     Breath sounds: Normal breath sounds. No wheezing.  Musculoskeletal:        General: No swelling. Normal range of motion.     Cervical back: Neck supple.  Lymphadenopathy:     Cervical: No cervical adenopathy.  Skin:    General: Skin is warm and dry.     Findings: No rash.  Neurological:     Mental Status: He is alert and oriented to person, place, and time.     Coordination: Coordination normal.  Psychiatric:        Behavior: Behavior normal.       Assessment & Plan:   Problem List Items Addressed This Visit       Cardiovascular and Mediastinum   Essential hypertension, benign - Primary   Relevant Orders   CBC with Differential/Platelet   CMP14+EGFR     Digestive   GERD (gastroesophageal reflux disease)   Relevant Medications   pantoprazole  (PROTONIX ) 40 MG  tablet   Other Relevant Orders   CBC with Differential/Platelet     Endocrine   Hypothyroidism   Relevant Orders   TSH     Other   Hyperlipidemia   Relevant Orders   CMP14+EGFR   Lipid panel    Patient is going to keep a dizziness journal and keeping track on his blood pressure and his heart rate and what medicines he took that day and what other symptoms he has along with that and bring it back with him. Follow up plan: Return in about 4 months (around 11/23/2023), or if symptoms worsen or fail to improve,  for Hypertension and thyroid  and cholesterol.  Counseling provided for all of the vaccine components Orders Placed This Encounter  Procedures   CBC with Differential/Platelet   CMP14+EGFR   Lipid panel   TSH    Jolyne Needs, MD [facility] 07/24/2023, 10:47 AM

## 2023-07-25 LAB — LIPID PANEL
Cholesterol, Total: 144 mg/dL (ref 100–199)
HDL: 69 mg/dL (ref >39)
LDL CALC COMMENT:: 2.1 ratio (ref 0.0–5.0)
LDL Chol Calc (NIH): 61 mg/dL (ref 0–99)
Triglycerides: 69 mg/dL (ref 0–149)
VLDL Cholesterol Cal: 14 mg/dL (ref 5–40)

## 2023-07-25 LAB — CBC WITH DIFFERENTIAL/PLATELET
Basophils Absolute: 0 10*3/uL (ref 0.0–0.2)
Basos: 1 %
EOS (ABSOLUTE): 0 10*3/uL (ref 0.0–0.4)
Eos: 0 %
Hematocrit: 44.1 % (ref 37.5–51.0)
Hemoglobin: 14.2 g/dL (ref 13.0–17.7)
Immature Grans (Abs): 0 10*3/uL (ref 0.0–0.1)
Immature Granulocytes: 1 %
Lymphocytes Absolute: 0.8 10*3/uL (ref 0.7–3.1)
Lymphs: 17 %
MCH: 29 pg (ref 26.6–33.0)
MCHC: 32.2 g/dL (ref 31.5–35.7)
MCV: 90 fL (ref 79–97)
Monocytes Absolute: 0.3 10*3/uL (ref 0.1–0.9)
Monocytes: 8 %
Neutrophils Absolute: 3.2 10*3/uL (ref 1.4–7.0)
Neutrophils: 73 %
Platelets: 190 10*3/uL (ref 150–450)
RBC: 4.9 x10E6/uL (ref 4.14–5.80)
RDW: 12.9 % (ref 11.6–15.4)
WBC: 4.3 10*3/uL (ref 3.4–10.8)

## 2023-07-25 LAB — CMP14+EGFR
ALT: 11 IU/L (ref 0–44)
AST: 23 IU/L (ref 0–40)
Albumin: 4.3 g/dL (ref 3.7–4.7)
Alkaline Phosphatase: 65 IU/L (ref 44–121)
BUN/Creatinine Ratio: 14 (ref 10–24)
BUN: 15 mg/dL (ref 8–27)
Bilirubin Total: 0.6 mg/dL (ref 0.0–1.2)
CO2: 22 mmol/L (ref 20–29)
Calcium: 9.2 mg/dL (ref 8.6–10.2)
Chloride: 103 mmol/L (ref 96–106)
Creatinine, Ser: 1.08 mg/dL (ref 0.76–1.27)
Globulin, Total: 2.1 g/dL (ref 1.5–4.5)
Glucose: 83 mg/dL (ref 70–99)
Potassium: 4.3 mmol/L (ref 3.5–5.2)
Sodium: 139 mmol/L (ref 134–144)
Total Protein: 6.4 g/dL (ref 6.0–8.5)
eGFR: 68 mL/min/{1.73_m2} (ref >59)

## 2023-07-25 LAB — TSH: TSH: 1.06 u[IU]/mL (ref 0.450–4.500)

## 2023-07-27 ENCOUNTER — Encounter: Payer: Self-pay | Admitting: Family Medicine

## 2023-08-25 ENCOUNTER — Ambulatory Visit: Attending: Cardiovascular Disease | Admitting: *Deleted

## 2023-08-25 DIAGNOSIS — Z9889 Other specified postprocedural states: Secondary | ICD-10-CM | POA: Diagnosis not present

## 2023-08-25 DIAGNOSIS — I71019 Dissection of thoracic aorta, unspecified: Secondary | ICD-10-CM | POA: Diagnosis not present

## 2023-08-25 LAB — POCT INR
INR: 3.4 — AB (ref 2.0–3.0)
INR: 3.4 — AB (ref 2.0–3.0)

## 2023-08-25 NOTE — Patient Instructions (Signed)
 Description   Do not take any warfarin today then continue taking Warfarin 1/2 tablet daily except for 1 tablet on Mondays, Wednesdays, and Fridays.  Recheck INR in 6 weeks.  Coumadin  Clinic [phone]

## 2023-09-11 DIAGNOSIS — Z85828 Personal history of other malignant neoplasm of skin: Secondary | ICD-10-CM | POA: Diagnosis not present

## 2023-09-11 DIAGNOSIS — L570 Actinic keratosis: Secondary | ICD-10-CM | POA: Diagnosis not present

## 2023-09-11 DIAGNOSIS — M9903 Segmental and somatic dysfunction of lumbar region: Secondary | ICD-10-CM | POA: Diagnosis not present

## 2023-09-11 DIAGNOSIS — M9904 Segmental and somatic dysfunction of sacral region: Secondary | ICD-10-CM | POA: Diagnosis not present

## 2023-09-11 DIAGNOSIS — L820 Inflamed seborrheic keratosis: Secondary | ICD-10-CM | POA: Diagnosis not present

## 2023-09-11 DIAGNOSIS — L438 Other lichen planus: Secondary | ICD-10-CM | POA: Diagnosis not present

## 2023-09-11 DIAGNOSIS — M9905 Segmental and somatic dysfunction of pelvic region: Secondary | ICD-10-CM | POA: Diagnosis not present

## 2023-09-11 DIAGNOSIS — M62830 Muscle spasm of back: Secondary | ICD-10-CM | POA: Diagnosis not present

## 2023-09-12 ENCOUNTER — Other Ambulatory Visit (HOSPITAL_COMMUNITY): Payer: Self-pay | Admitting: General Practice

## 2023-09-12 DIAGNOSIS — Z9889 Other specified postprocedural states: Secondary | ICD-10-CM

## 2023-09-12 DIAGNOSIS — I71019 Dissection of thoracic aorta, unspecified: Secondary | ICD-10-CM

## 2023-09-13 DIAGNOSIS — M9903 Segmental and somatic dysfunction of lumbar region: Secondary | ICD-10-CM | POA: Diagnosis not present

## 2023-09-13 DIAGNOSIS — M9904 Segmental and somatic dysfunction of sacral region: Secondary | ICD-10-CM | POA: Diagnosis not present

## 2023-09-13 DIAGNOSIS — M62830 Muscle spasm of back: Secondary | ICD-10-CM | POA: Diagnosis not present

## 2023-09-13 DIAGNOSIS — M9905 Segmental and somatic dysfunction of pelvic region: Secondary | ICD-10-CM | POA: Diagnosis not present

## 2023-09-18 DIAGNOSIS — M62830 Muscle spasm of back: Secondary | ICD-10-CM | POA: Diagnosis not present

## 2023-09-18 DIAGNOSIS — M9903 Segmental and somatic dysfunction of lumbar region: Secondary | ICD-10-CM | POA: Diagnosis not present

## 2023-09-18 DIAGNOSIS — M9904 Segmental and somatic dysfunction of sacral region: Secondary | ICD-10-CM | POA: Diagnosis not present

## 2023-09-18 DIAGNOSIS — M9905 Segmental and somatic dysfunction of pelvic region: Secondary | ICD-10-CM | POA: Diagnosis not present

## 2023-09-29 ENCOUNTER — Encounter

## 2023-10-09 ENCOUNTER — Ambulatory Visit (HOSPITAL_COMMUNITY)
Admission: RE | Admit: 2023-10-09 | Discharge: 2023-10-09 | Disposition: A | Discharge status: Home / Self Care | Source: Ambulatory Visit | Attending: Cardiology | Admitting: Cardiology

## 2023-10-09 DIAGNOSIS — Z9889 Other specified postprocedural states: Secondary | ICD-10-CM | POA: Insufficient documentation

## 2023-10-09 DIAGNOSIS — I71019 Dissection of thoracic aorta, unspecified: Secondary | ICD-10-CM | POA: Insufficient documentation

## 2023-10-09 LAB — ECHOCARDIOGRAM COMPLETE
Area-P 1/2: 2.5 cm2
MV M vel: 5.1 m/s
MV Peak grad: 103.8 mmHg
MV VTI: 1.14 cm2
Radius: 0.3 cm
S' Lateral: 3.6 cm

## 2023-10-09 NOTE — Progress Notes (Unsigned)
 Cardiology Clinic Note   Patient Name: Patrick Bell Date of Encounter: 10/10/2023  Primary Care Provider:  Dettinger, Fonda LABOR, MD Primary Cardiologist:  Lonni LITTIE Nanas, MD  Patient Profile    Patrick Bell 83 year old male presents to the clinic today for follow-up evaluation of his essential hypertension and atrial fibrillation.  Past Medical History    Past Medical History:  Diagnosis Date   Acute thoracic aortic dissection (HCC) 03/11/2020   Type A   Allergy    Asthma    Cataract    Cataract    Diverticulitis    Dyspnea    GERD (gastroesophageal reflux disease)    Heart murmur 12/2019   Hyperlipidemia    Hypertension    Inguinal hernia bilateral, non-recurrent    S/P aortic dissection repair 03/11/2020   supracoronary straight graft repair with resuspension of native aortic valve and open distal anastomosis for intraoperative acute type A aortic dissection    S/P mitral valve repair 03/11/2020   Complex valvuloplasty including artificial Gore-tex neochord placement x 4, suture plication of posterior leaflet and posteromedial commissure and 32 mm Sorin Memo 4D ring annuloplasty   Sleep apnea    Past Surgical History:  Procedure Laterality Date   bilateral inguinal hernia     CARDIAC CATHETERIZATION Bilateral 02/21/2020   CHEST TUBE INSERTION N/A 03/22/2020   Procedure: CHEST TUBE INSERTION OF 28 BLAKE DRAIN.;  Surgeon: Army Dallas NOVAK, MD;  Location: [facility] OR;  Service: Thoracic;  Laterality: N/A;   COLONOSCOPY  06/09/2004   WLM:Dphfnpi colon diverticulosis, otherwise normal colonoscopy   EYE SURGERY  08/2014   HERNIA REPAIR     MITRAL VALVE REPAIR N/A 03/11/2020   Procedure: MITRAL VALVE REPAIR (MVR) USING MEMO 4D SIZE 32 RING;  Surgeon: Dusty Sudie DEL, MD;  Location: [facility] OR;  Service: Open Heart Surgery;  Laterality: N/A;   PALATE / UVULA BIOPSY / EXCISION     PERICARDIAL WINDOW N/A 03/22/2020   Procedure: SUBXYPHOID POST-OP PERICARDIAL FLUID  DRAINAGE WITH CHEST TUBE INSERTION.;  Surgeon: Army Dallas NOVAK, MD;  Location: [facility] OR;  Service: Thoracic;  Laterality: N/A;   REPAIR OF ACUTE ASCENDING THORACIC AORTIC DISSECTION N/A 03/11/2020   Procedure: REPAIR OF ACUTE ASCENDING THORACIC AORTIC DISSECTION USING A HEMASHIELD PLATINUM STRAIGHT GRAFT AND A HEASHIELD PLATINUM 28X10MM SINGLE ARM GRAFT;  Surgeon: Dusty Sudie DEL, MD;  Location: [facility] OR;  Service: Open Heart Surgery;  Laterality: N/A;   RIGHT/LEFT HEART CATH AND CORONARY ANGIOGRAPHY N/A 02/21/2020   Procedure: RIGHT/LEFT HEART CATH AND CORONARY ANGIOGRAPHY;  Surgeon: Burnard Debby LABOR, MD;  Location: [facility] INVASIVE [facility] LAB;  Service: Cardiovascular;  Laterality: N/A;   TEE WITHOUT CARDIOVERSION N/A 12/19/2019   Procedure: TRANSESOPHAGEAL ECHOCARDIOGRAM (TEE);  Surgeon: Lonni Slain, MD;  Location: [facility] ENDOSCOPY;  Service: Cardiovascular;  Laterality: N/A;   TEE WITHOUT CARDIOVERSION N/A 03/11/2020   Procedure: TRANSESOPHAGEAL ECHOCARDIOGRAM (TEE);  Surgeon: Dusty Sudie DEL, MD;  Location: [facility] OR;  Service: Open Heart Surgery;  Laterality: N/A;   TEE WITHOUT CARDIOVERSION N/A 03/22/2020   Procedure: TRANSESOPHAGEAL ECHOCARDIOGRAM (TEE);  Surgeon: Army Dallas NOVAK, MD;  Location: [facility] OR;  Service: Thoracic;  Laterality: N/A;   TONSILLECTOMY     VASECTOMY      Allergies  No Known Allergies  History of Present Illness    Patrick Bell has a PMH of HTN, HLD, asthma, OSA, mitral valve regurgitation status post mitral valve repair by Dr. Dusty 03/11/2020.  His procedure was complicated  by type a aortic dissection requiring graft repair of his aorta and resuspension of his aortic valve.     He followed up with Herlene Canterbury, PA-C via telemedicine 1/22.  At that time his blood pressure was well-controlled.  His creatinine was stable as well as his anemia with a hemoglobin of 8.8.  He was seen in follow-up last by Dr. Burnard on 03/09/2022.  He denied exertional chest pain.  He did  note 2 episodes of short-lived mild chest sensation while sitting in a chair.  Episodes were nonexertional.  He admitted to mild dizziness.  His blood pressure remained stable.  He underwent follow-up sleep study by Dr. Glenwood and CPAP pressure of 11 cm was recommended.  He contacted the nurse triage line on 07/25/2022.  He reported that over the previous month he had noted increased shortness of breath with moving around and he denied lower extremity swelling as well as weight gain.  He reported losing around 10 pounds over the course of a month.  His blood pressure was 127/70 with a heart rate of 76.     He presented to the clinic 07/26/22 for follow-up evaluation and stated over the last several weeks he had noticed mild left chest discomfort, dizziness, a.m. nosebleeds, occasional dizziness and 10 pound weight loss.  He had poor appetite.  He noticed that his symptoms started after attending a funeral.  He was also not  as active due to back pain.  We reviewed his echocardiogram 04/15/2021 and aortic dissection.  I  ordered CBC, BMP, echocardiogram for further evaluation and plan follow-up in 2 to 3 months.  His follow-up echocardiogram 09/30/2022 showed normal LV function and intermediate diastolic parameters.  He was noted to have trivial mitral valve regurgitation and his aortic root was noted to be 39 mm.  Plan was made to repeat in 12 months.  He presented to the clinic 8/24 for follow-up evaluation and stated he had been noticing dizziness.  He brought in a blood pressure log which showed blood pressures in the 120-80s systolic over 70s-60s.  He presented to his PCP and was asked about amlodipine  reduction.  PCP recommended following up with cardiology for recommendations.  His hydration has been about 30 ounces daily.  We reviewed the importance of increasing hydration and physical activity.  I decreased his amlodipine  to 5 mg daily, asked him to increase his p.o. hydration,  increase his physical  activity as tolerated and planned follow-up in 6 to 9 months.  He was seen in follow-up by Dr. Lucas 04/03/2023.  He presented with his wife.  He was limited in his physical activity due to chronic back pain.  He denied chest pain and shortness of breath.  He presented to the clinic 06/02/23 for follow-up evaluation and stated he continued to do well.  He did note some dizziness occasionally.  Episode lasted for around 30 minutes.  When asked about his hydration he reported that he did not do well with it.  He was drinking about 1 bottle of water per day.  His blood pressure was well-controlled.  He remained stable from a cardiac standpoint.  I recommended that he drink around 50 ounces of water per day and wear lower extremity support stockings.  I will continued his  medication regimen and plan follow-up in 6 months.  He presents to the clinic today for follow-up evaluation and states he continues to notice some dizziness through the day.  He has not yet increased his p.o.  hydration.  He states physically active walking daily.  He presents with his wife.  We reviewed his echocardiogram and previous to chest CTs.  They expressed understanding.  I offered to decrease his amlodipine  and efforts to help with dizziness.  However, he wishes to increase hydration at this time.  He reports that he would like to start using his CPAP again.  He is planning to get the machine out and start using again.  I will reach out to our sleep medicine nurse for assistance with this.  I will plan follow-up in 4 to 6 months and advised him to follow-up with Dr. Lucas for reevaluation of his aorta.      Today he denies chest pain, shortness of breath, lower extremity edema, palpitations, melena, hematuria, hemoptysis, diaphoresis, weakness, presyncope, syncope, orthopnea, and PND.    Home Medications    Prior to Admission medications   Medication Sig Start Date End Date Taking? Authorizing Provider  acetaminophen   (TYLENOL ) 325 MG tablet Take 1-2 tablets (325-650 mg total) by mouth every 4 (four) hours as needed for mild pain. Patient taking differently: Take 325-650 mg by mouth every 4 (four) hours as needed for mild pain. Taking 1000mg  bid 04/06/20   Angiulli, Toribio PARAS, PA-C  albuterol  (VENTOLIN  HFA) 108 (90 Base) MCG/ACT inhaler INHALE 2 PUFFS EVERY 6 HOURS AS NEEDED FOR WHEEZING OR SHORTNESS OF BREATH 08/25/21   Dettinger, Fonda LABOR, MD  amLODipine  (NORVASC ) 10 MG tablet Take 1 tablet (10 mg total) by mouth daily. 03/09/22   Burnard Debby LABOR, MD  guaiFENesin  (MUCINEX ) 600 MG 12 hr tablet Take 600 mg by mouth every 4 (four) hours as needed for cough or to loosen phlegm.    [provider]  ipratropium (ATROVENT) 0.03 % nasal spray  10/21/21   [provider]  levothyroxine  (SYNTHROID ) 50 MCG tablet Take 1 tablet (50 mcg total) by mouth daily. 07/07/22   Dettinger, Fonda LABOR, MD  methocarbamol  (ROBAXIN ) 500 MG tablet Take 1 tablet (500 mg total) by mouth 4 (four) times daily. 06/24/22   Gladis Mustard, FNP  metoprolol  succinate (TOPROL -XL) 50 MG 24 hr tablet TAKE ONE TABLET AT BEDTIME 06/10/22   Duke, Jon Garre, PA  montelukast  (SINGULAIR ) 10 MG tablet Take 1 tablet (10 mg total) by mouth daily. 07/07/22   Dettinger, Fonda LABOR, MD  Olopatadine  HCl 0.2 % SOLN Place 1 drop into both eyes daily as needed (allergies).    [provider]  pantoprazole  (PROTONIX ) 40 MG tablet TAKE 1 TABLET DAILY 07/07/22   Dettinger, Fonda LABOR, MD  rosuvastatin  (CRESTOR ) 20 MG tablet Take 1 tablet (20 mg total) by mouth daily. 03/09/22   Burnard Debby LABOR, MD  traMADol  (ULTRAM ) 50 MG tablet Take 1 tablet (50 mg total) by mouth every 12 (twelve) hours as needed. 07/07/22   Dettinger, Fonda LABOR, MD  warfarin (COUMADIN ) 5 MG tablet TAKE ONE TABLET ONCE DAILY AT 4PM 07/08/21   Dettinger, Fonda LABOR, MD    Family History    Family History  Problem Relation Age of Onset   Heart disease Mother    Hip fracture Mother 28    Osteoporosis Mother    Dementia Mother 3   Arthritis Father    Heart disease Father    Arthritis Sister    Arthritis Brother    Colon cancer Neg Hx    He indicated that his mother is deceased. He indicated that his father is deceased. He indicated that his sister is alive. He  indicated that his brother is alive. He indicated that the status of his neg hx is unknown.  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  Tobacco Use   Smoking status: Former    Current packs/day: 0.00    Average packs/day: 4.0 packs/day for 25.0 years (100.0 ttl pk-yrs)    Types: Cigarettes    Start date: 06/22/1957    Quit date: 06/23/1982    Years since quitting: 41.3   Smokeless tobacco: Never   Tobacco comments:    pt was a truck Psychiatric nurse   Vaping status: Never Used  Substance and Sexual Activity   Alcohol use: Yes    Alcohol/week: 5.0 standard drinks of alcohol    Types: 5 Shots of liquor per week    Comment: brandy a few times a month, beer once a month   Drug use: No   Sexual activity: Yes    Birth control/protection: Surgical  Other Topics Concern   Not on file  Social History Narrative   Not on file   Social Drivers of Health   Financial Resource Strain: Low Risk  (01/11/2023)   Overall Financial Resource Strain (CARDIA)    Difficulty of Paying Living Expenses: Not hard at all  Food Insecurity: No Food Insecurity (01/11/2023)   Hunger Vital Sign    Worried About Running Out of Food in the Last Year: Never true    Ran Out of Food in the Last Year: Never true  Transportation Needs: No Transportation Needs (01/11/2023)   PRAPARE - Administrator, Civil Service (Medical): No    Lack of Transportation  (Non-Medical): No  Physical Activity: Insufficiently Active (01/11/2023)   Exercise Vital Sign    Days of Exercise per Week: 3 days    Minutes of Exercise per Session: 30 min  Stress: No Stress Concern Present (01/11/2023)   [employer] of Occupational Health - Occupational Stress Questionnaire    Feeling of Stress : Not at all  Social Connections: Moderately Integrated (01/11/2023)   Social Connection and Isolation Panel    Frequency of Communication with Friends and Family: More than three times a week    Frequency of Social Gatherings with Friends and Family: More than three times a week    Attends Religious Services: More than 4 times per year    Active Member of [employer] or Organizations: No    Attends Banker Meetings: Never    Marital Status: Married  Catering manager Violence: Not At Risk (01/11/2023)   Humiliation, Afraid, Rape, and Kick questionnaire    Fear of Current or Ex-Partner: No    Emotionally Abused: No    Physically Abused: No    Sexually Abused: No     Review of Systems    General:  No chills, fever, night sweats or weight changes.  Cardiovascular:  No chest pain, dyspnea on exertion, edema, orthopnea, palpitations, paroxysmal nocturnal dyspnea. Dermatological: No rash, lesions/masses Respiratory: No cough, dyspnea Urologic: No hematuria, dysuria Abdominal:   No nausea, vomiting, diarrhea, bright red blood per rectum, melena, or hematemesis Neurologic:  No visual changes, wkns, changes in mental status. All other systems reviewed and are  otherwise negative except as noted above.  Physical Exam    VS:  BP 120/62 (BP Location: Left Arm, Patient Position: Sitting)   Pulse 71   Ht 5' 8 (1.727 m)   Wt 178 lb (80.7 kg)   SpO2 96%   BMI 27.06 kg/m  , BMI Body mass index is 27.06 kg/m. GEN: Well nourished, well developed, in no acute distress. HEENT: normal. Neck: Supple, no JVD, carotid bruits, or masses. Cardiac: RRR, 2/6 systolic murmur  heard along left sternal border.  Murmurs, rubs, or gallops. No clubbing, cyanosis, slight right greater than left nonpitting edema.  Radials/DP/PT 2+ and equal bilaterally.  Respiratory:  Respirations regular and unlabored, clear to auscultation bilaterally. GI: Soft, nontender, nondistended, BS + x 4. MS: no deformity or atrophy. Skin: warm and dry, no rash. Neuro:  Strength and sensation are intact. Psych: Normal affect.  Accessory Clinical Findings    Recent Labs: 07/24/2023: ALT 11; BUN 15; Creatinine, Ser 1.08; Hemoglobin 14.2; Platelets 190; Potassium 4.3; Sodium 139; TSH 1.060   Recent Lipid Panel    Component Value Date/Time   CHOL 144 07/24/2023 1053   CHOL 168 06/18/2012 1031   TRIG 69 07/24/2023 1053   TRIG 101 01/14/2013 1218   TRIG 102 06/18/2012 1031   HDL 69 07/24/2023 1053   HDL 65 01/14/2013 1218   HDL 62 06/18/2012 1031   CHOLHDL 2.1 07/24/2023 1053   LDLCALC 61 07/24/2023 1053   LDLCALC 99 01/14/2013 1218   LDLCALC 86 06/18/2012 1031         ECG personally reviewed by me today-none today.  EKG 07/26/2022 normal sinus rhythm with occasional premature ventricular complexes 77 bpm- No acute changes  CT chest/abdomen/pelvis 03/17/2022  CTA CHEST FINDINGS   Cardiovascular: Repair of the ascending thoracic aortic dissection with a graft. The graft is widely patent. Again noted is a dissection involving the aortic arch and extending into the descending thoracic aorta. Aortic arch on sequence 6, image 50 measures approximately 4.5 cm and previously measured 4.4 cm. Great vessels remain patent. Again noted is a dissection extending into the left subclavian artery. Left vertebral artery is patent. Left subclavian artery dissection does not extend into the left axillary artery. Proximal common carotid arteries are patent bilaterally. Evidence for small right vertebral artery. Proximal descending thoracic aorta measures 4.3 cm and stable. Mid descending  thoracic aorta at the level of the main pulmonary artery measures 3.4 cm and stable. Distal descending thoracic aorta measures 2.6 cm and stable. Aortic dissection extends into the abdominal aorta. Stable appearance of the heart with previous mitral valve repair.   Mediastinum/Nodes: No lymph node enlargement in the mediastinum or hila. Thyroid  tissue is unremarkable. No axillary lymph node enlargement. Esophagus is unremarkable.   Lungs/Pleura: Trachea and mainstem bronchi are patent. Stable elongated nodular density in the right middle lobe on sequence 8, image 116 measures up to 7 mm. Stable tiny nodular density in the right upper lobe on sequence 8, image 25. No airspace disease or consolidation in the lungs. Stable branching densities in the periphery of the left upper lobe on sequence 8, image 64. No large pleural effusions. Stable subtle nodular density in the left upper lobe on sequence 8, image 28.   Musculoskeletal: No acute bone abnormality.   Review of the MIP images confirms the above findings.   CTA ABDOMEN AND PELVIS FINDINGS   VASCULAR   Aorta: Aortic dissection involving the entire abdominal aorta that extends into the iliac arteries bilaterally.  Configuration of the abdominal aorta and dissection has not significantly changed. Supra celiac abdominal aorta measures 2.9 cm and stable. True lumen is along the left side of the proximal abdominal aorta.   Celiac: Celiac trunk originates from the true lumen. Celiac trunk is patent without aneurysm or dissection. No significant stenosis.   SMA: Dissection involving the origin the SMA but the flow appears to be predominantly from the true lumen. No significant stenosis or aneurysm involving the SMA.   Renals: Dissection involves the proximal right renal artery. Dissection most likely involves the proximal left renal artery. This configuration is unchanged. Bilateral renal arteries are patent without significant  stenosis or aneurysm.   IMA: Originates from the false lumen and patent without aneurysm or significant stenosis.   Inflow: Dissection extends into the common iliac arteries bilaterally. Dissection extends into the right external iliac artery and terminates in the mid/distal aspect and stable from the previous examination. Left iliac dissection extends into the distal left external iliac artery and terminates in the proximal left common femoral artery region. Configuration and extent of the left iliac artery dissection has not significantly changed. Bilateral internal iliac arteries are patent and originating from the true lumens. Proximal femoral arteries are patent bilaterally.   Veins: No obvious venous abnormality within the limitations of this arterial phase study.   Review of the MIP images confirms the above findings.   NON-VASCULAR   Hepatobiliary: Subtle hypodensity in the posterior right hepatic lobe on sequence 11, image 72 is nonspecific and likely an incidental finding. No biliary dilatation. Portal venous system is patent. Normal appearance of the gallbladder.   Pancreas: Unremarkable. No pancreatic ductal dilatation or surrounding inflammatory changes.   Spleen: Normal in size without focal abnormality.   Adrenals/Urinary Tract: Normal adrenal glands. Stable appearance of both kidneys without hydronephrosis. Probable left renal sinus cyst which is similar to the previous examination. Evidence for small cortical renal cysts. These cysts do not require dedicated follow-up. No suspicious renal lesions. Normal appearance of the urinary bladder.   Stomach/Bowel: Normal appearance of the stomach and small bowel. Colonic diverticulosis particularly in the sigmoid colon. Chronic densities along the origin of the left inguinal canal related to previous hernia repair. No evidence for acute colonic or bowel inflammation. Normal appendix without inflammatory changes.  There is small bowel near the origin of the right inguinal canal.   Lymphatic: No lymph node enlargement in the abdomen or pelvis.   Reproductive: Prostate is upper limits of normal and stable the previous examination.   Other: Negative for free fluid. Negative for free air. Evidence for bilateral inguinal hernia repairs.   Musculoskeletal: Multilevel degenerative disc and facet disease in lumbar spine. No acute bone abnormality.   Review of the MIP images confirms the above findings.   IMPRESSION: 1. Stable appearance of the residual dissection involving the aortic arch, descending thoracic aorta, abdominal aorta and bilateral iliac arteries. No significant change in the size of the thoracic or abdominal aorta. Stable appearance of the surgically repaired ascending thoracic aorta. 2. No acute abnormality involving the chest, abdomen or pelvis. 3. Colonic diverticulosis without acute inflammation.     Electronically Signed   By: Juliene Balder M.D.   On: 03/18/2022 10:52  Echocardiogram 04/15/2021 1. Left ventricular ejection fraction, by estimation, is 60 to 65%. Left  ventricular ejection fraction by 3D volume is 63 %. The left ventricle has  normal function. The left ventricle has no regional wall motion  abnormalities. There is mild  asymmetric  left ventricular hypertrophy. Left ventricular diastolic parameters are  consistent with Grade I diastolic dysfunction (impaired relaxation).   2. Right ventricular systolic function is moderately reduced. The right  ventricular size is normal. There is normal pulmonary artery systolic  pressure.   3. Left atrial size was moderately dilated.   4. Right atrial size was mildly dilated.   5. The mitral valve has been repaired/replaced. Mild mitral valve  regurgitation.   6. The aortic valve is grossly normal. Aortic valve regurgitation is  mild.   Echocardiogram 09/30/2022  IMPRESSIONS     1. Left ventricular ejection fraction,  by estimation, is 55%. The left  ventricle has normal function. The left ventricle has no regional wall  motion abnormalities. Left ventricular diastolic parameters are  indeterminate.   2. Right ventricular systolic function is mildly reduced. The right  ventricular size is normal.   3. Left atrial size was moderately dilated.   4. The mitral valve has been repaired/replaced. Trivial mitral valve  regurgitation. No evidence of mitral stenosis. The mean mitral valve  gradient is 3.3 mmHg.   5. The aortic valve is tricuspid. There is mild calcification of the  aortic valve. Aortic valve regurgitation is mild. Aortic valve  sclerosis/calcification is present, without any evidence of aortic  stenosis.   6. Aortic dilatation noted. There is borderline dilatation of the aortic  root, measuring 39 mm.   7. The inferior vena cava is normal in size with greater than 50%  respiratory variability, suggesting right atrial pressure of 3 mmHg.   FINDINGS   Left Ventricle: Left ventricular ejection fraction, by estimation, is  55%. The left ventricle has normal function. The left ventricle has no  regional wall motion abnormalities. The left ventricular internal cavity  size was normal in size. There is no  left ventricular hypertrophy. Abnormal (paradoxical) septal motion  consistent with post-operative status. Left ventricular diastolic  parameters are indeterminate.   Right Ventricle: The right ventricular size is normal. No increase in  right ventricular wall thickness. Right ventricular systolic function is  mildly reduced.   Left Atrium: Left atrial size was moderately dilated.   Right Atrium: Right atrial size was normal in size.   Pericardium: There is no evidence of pericardial effusion.   Mitral Valve: The mitral valve has been repaired/replaced. Trivial mitral  valve regurgitation. There is a prosthetic annuloplasty ring present in  the mitral position. No evidence of mitral  valve stenosis. MV peak  gradient, 7.3 mmHg. The mean mitral valve   gradient is 3.3 mmHg.   Tricuspid Valve: The tricuspid valve is normal in structure. Tricuspid  valve regurgitation is mild . No evidence of tricuspid stenosis.   Aortic Valve: The aortic valve is tricuspid. There is mild calcification  of the aortic valve. Aortic valve regurgitation is mild. Aortic valve  sclerosis/calcification is present, without any evidence of aortic  stenosis. Aortic valve mean gradient  measures 2.0 mmHg. Aortic valve peak gradient measures 4.0 mmHg. Aortic  valve area, by VTI measures 3.63 cm.   Pulmonic Valve: The pulmonic valve was normal in structure. Pulmonic valve  regurgitation is mild. No evidence of pulmonic stenosis.   Aorta: Aortic dilatation noted. There is borderline dilatation of the  aortic root, measuring 39 mm.   Venous: The inferior vena cava is normal in size with greater than 50%  respiratory variability, suggesting right atrial pressure of 3 mmHg.   IAS/Shunts: No atrial level shunt detected by color flow Doppler.  Assessment & Plan   1.  Dizziness-denies further episodes.  Continues to report poor hydration.  Previously had amlodipine  reduced . Increase hydration-goal 50 ounces daily Change positions slowly Continue lower extremity support stockings  Mitral valve prolapse-status post mitral valve repair.  Echocardiogram 09/30/2022 showed replaced/repaired mitral valve with trivial mitral valve regurgitation.  Peak gradient 7.3 mmHg.  Mean gradient 3.3 mmHg. Repeat echocardiogram has not yet resulted.  (10/09/2023)  Paroxysmal atrial fibrillation-heart rate today 71 bpm.  Denies bleeding issues.  Reports compliance with Coumadin . Continue Coumadin , metoprolol  Avoid triggers caffeine, chocolate, EtOH, dehydration etc.-reviewed  Shortness of breath/DOE-stable.  Echocardiogram 09/30/2022 showed an LVEF of 55%, intermediate diastolic parameters, moderately dilated left  atria replaced/repaired mitral valve with trivial mitral valve regurgitation and no evidence of mitral valve stenosis, mild aortic valve regurgitation, and aortic dilation measuring 39 mm.   Increase physical activity as tolerated Heart healthy low-sodium diet   Essential hypertension-BP today 133/73.   Maintain blood pressure log Continue metoprolol  Continue amlodipine  to 5 mg daily Low-sodium diet  Aortic dissection-status post graft repair.  Denies episodes of back and chest discomfort. CTA chest/abdomen/pelvis showed widely patent graft/stable dissection with no significant changes.  Repeat echocardiogram showed borderline aortic root measuring 39 mm. Maintain good blood pressure control Recommend repeat chest abdomen pelvis CT 2025 Follows with Dr. Dorise Barley  OSA-currently not using CPAP.  Agrees to start using again.  Notes that he has supplies at this time. Will have sleep medicine nurse contact him for evaluation and recommendations.   Disposition: Follow-up with Dr. Kate or me in 6-9 months.   Josefa HERO. Shandelle Borrelli NP-C     10/10/2023, 11:14 AM [facility] [address] Office [phone] Fax [phone]    I spent 14 minutes examining this patient, reviewing medications, and using patient centered shared decision making involving her cardiac care.  Prior to her visit I spent greater than 20 minutes reviewing her past medical history,  medications, and prior cardiac tests.

## 2023-10-10 ENCOUNTER — Ambulatory Visit (INDEPENDENT_AMBULATORY_CARE_PROVIDER_SITE_OTHER): Admitting: *Deleted

## 2023-10-10 ENCOUNTER — Ambulatory Visit: Attending: Cardiology | Admitting: General Practice

## 2023-10-10 ENCOUNTER — Encounter: Payer: Self-pay | Admitting: General Practice

## 2023-10-10 ENCOUNTER — Ambulatory Visit: Payer: Self-pay | Admitting: General Practice

## 2023-10-10 VITALS — BP 120/62 | HR 71 | Ht 68.0 in | Wt 178.0 lb

## 2023-10-10 DIAGNOSIS — G4733 Obstructive sleep apnea (adult) (pediatric): Secondary | ICD-10-CM

## 2023-10-10 DIAGNOSIS — Z9889 Other specified postprocedural states: Secondary | ICD-10-CM | POA: Diagnosis not present

## 2023-10-10 DIAGNOSIS — R42 Dizziness and giddiness: Secondary | ICD-10-CM | POA: Diagnosis not present

## 2023-10-10 DIAGNOSIS — I480 Paroxysmal atrial fibrillation: Secondary | ICD-10-CM | POA: Diagnosis not present

## 2023-10-10 DIAGNOSIS — I10 Essential (primary) hypertension: Secondary | ICD-10-CM

## 2023-10-10 DIAGNOSIS — I71019 Dissection of thoracic aorta, unspecified: Secondary | ICD-10-CM

## 2023-10-10 LAB — POCT INR: INR: 3 (ref 2.0–3.0)

## 2023-10-10 NOTE — Patient Instructions (Signed)
 Description   Continue taking Warfarin 1/2 tablet daily except for 1 tablet on Mondays, Wednesdays, and Fridays.  Recheck INR in 6 weeks.  Coumadin Clinic [phone]

## 2023-10-10 NOTE — Progress Notes (Signed)
Please see anticoagulation encounter.

## 2023-10-10 NOTE — Patient Instructions (Signed)
 Medication Instructions:  Your physician recommends that you continue on your current medications as directed. Please refer to the Current Medication list given to you today.  *If you need a refill on your cardiac medications before your next appointment, please call your pharmacy*  Lab Work: NONE If you have labs (blood work) drawn today and your tests are completely normal, you will receive your results only by: MyChart Message (if you have MyChart) OR A paper copy in the mail If you have any lab test that is abnormal or we need to change your treatment, we will call you to review the results.  Testing/Procedures: NONE  Follow-Up: At [facility], you and your health needs are our priority.  As part of our continuing mission to provide you with exceptional heart care, our providers are all part of one team.  This team includes your primary Cardiologist (physician) and Advanced Practice Providers or APPs (Physician Assistants and Nurse Practitioners) who all work together to provide you with the care you need, when you need it.  Your next appointment:   4-6 months  Provider:   Emelia, NP or Kate, MD  We recommend signing up for the patient portal called MyChart.  Sign up information is provided on this After Visit Summary.  MyChart is used to connect with patients for Virtual Visits (Telemedicine).  Patients are able to view lab/test results, encounter notes, upcoming appointments, etc.  Non-urgent messages can be sent to your provider as well.   To learn more about what you can do with MyChart, go to [email or web address].   Other Instructions Increase oral hydration to 50 ounces each day

## 2023-10-16 ENCOUNTER — Other Ambulatory Visit: Payer: Self-pay | Admitting: Family Medicine

## 2023-10-17 ENCOUNTER — Other Ambulatory Visit: Payer: Self-pay | Admitting: General Practice

## 2023-11-21 ENCOUNTER — Ambulatory Visit: Attending: Cardiology

## 2023-11-21 DIAGNOSIS — Z9889 Other specified postprocedural states: Secondary | ICD-10-CM

## 2023-11-21 DIAGNOSIS — Z5181 Encounter for therapeutic drug level monitoring: Secondary | ICD-10-CM

## 2023-11-21 DIAGNOSIS — I71019 Dissection of thoracic aorta, unspecified: Secondary | ICD-10-CM

## 2023-11-21 LAB — POCT INR: INR: 2.5 (ref 2.0–3.0)

## 2023-11-21 NOTE — Progress Notes (Signed)
 INR 2.5 Please see anticoagulation encounter   Continue taking Warfarin 1/2 tablet daily except for 1 tablet on Mondays, Wednesdays, and Fridays.  Recheck INR in 6 weeks. Coumadin  Clinic [phone]

## 2023-11-21 NOTE — Patient Instructions (Signed)
 Continue taking Warfarin 1/2 tablet daily except for 1 tablet on Mondays, Wednesdays, and Fridays.  Recheck INR in 6 weeks.  Coumadin Clinic [phone]

## 2023-11-23 ENCOUNTER — Ambulatory Visit: Admitting: Family Medicine

## 2023-12-07 ENCOUNTER — Other Ambulatory Visit: Payer: Self-pay | Admitting: Family Medicine

## 2023-12-07 ENCOUNTER — Ambulatory Visit: Admitting: Family Medicine

## 2023-12-07 ENCOUNTER — Encounter: Payer: Self-pay | Admitting: Family Medicine

## 2023-12-07 VITALS — BP 128/74 | HR 65 | Ht 68.0 in | Wt 176.0 lb

## 2023-12-07 DIAGNOSIS — R251 Tremor, unspecified: Secondary | ICD-10-CM

## 2023-12-07 DIAGNOSIS — R09A2 Foreign body sensation, throat: Secondary | ICD-10-CM | POA: Diagnosis not present

## 2023-12-07 DIAGNOSIS — R2689 Other abnormalities of gait and mobility: Secondary | ICD-10-CM | POA: Diagnosis not present

## 2023-12-07 DIAGNOSIS — Z23 Encounter for immunization: Secondary | ICD-10-CM

## 2023-12-07 DIAGNOSIS — R42 Dizziness and giddiness: Secondary | ICD-10-CM | POA: Diagnosis not present

## 2023-12-07 DIAGNOSIS — R0989 Other specified symptoms and signs involving the circulatory and respiratory systems: Secondary | ICD-10-CM

## 2023-12-07 DIAGNOSIS — K219 Gastro-esophageal reflux disease without esophagitis: Secondary | ICD-10-CM

## 2023-12-07 MED ORDER — ROSUVASTATIN CALCIUM 20 MG PO TABS: 20.0000 mg | ORAL_TABLET | Freq: Every day | ORAL | 3 refills | Status: AC

## 2023-12-07 NOTE — Progress Notes (Signed)
 BP 128/74   Pulse 65   Ht 5' 8 (1.727 m)   Wt 176 lb (79.8 kg)   SpO2 97%   BMI 26.76 kg/m    Subjective:   Patient ID: Patrick Bell, male    DOB: 10/12/1940, 83 y.o.   MRN: [id number]  HPI: Patrick Bell is a 83 y.o. male presenting on 12/07/2023 for Dizziness   Discussed the use of AI scribe software for clinical note transcription with the patient, who gave verbal consent to proceed.  History of Present Illness   Patrick Bell is an 83 year old male who presents with dizziness and balance issues.  He experiences dizziness and balance issues that have persisted despite previous adjustments to his medication regimen. The dizziness is described as a feeling of tiredness and difficulty walking, with significant balance problems. No sensation of the room spinning or feeling like he is going to pass out. He reports balance problems, but the observation that he shuffles and sometimes staggers, especially when getting up from a seated position, was provided by a family member.  He consumes two cups of coffee and a Coke daily, with minimal water intake. Attempts to increase water intake to 64 ounces have been unsuccessful, managing only about 20 ounces.  He experiences excessive drooling, particularly in the evenings, and clear phlegm production when eating and upon waking. No heartburn, belching, or sinus issues. His nose runs when he eats, but he does not feel the need to blow his nose otherwise.  He mentions a tremor in his arm that occurs intermittently, especially when he is not concentrating. He can control it with focus, but it returns when he loses concentration. He also reports difficulty with writing, describing a disconnect between his brain and fingers, and notes that his finger sometimes locks up.  He has a family history of Alzheimer's disease, as his mother had the condition, and the patient recalls that she also experienced tremors in her later years.  He is  currently taking methocarbamol  occasionally, about one or two times a week, and rosuvastatin  20 mg for cholesterol. He also takes metoprolol  and Protonix  (pantoprazole ).          Relevant past medical, surgical, family and social history reviewed and updated as indicated. Interim medical history since our last visit reviewed. Allergies and medications reviewed and updated.  Review of Systems  Constitutional:  Positive for fatigue. Negative for chills and fever.  Eyes:  Negative for visual disturbance.  Respiratory:  Negative for shortness of breath and wheezing.   Cardiovascular:  Negative for chest pain and leg swelling.  Musculoskeletal:  Positive for gait problem. Negative for back pain.  Skin:  Negative for rash.  Neurological:  Positive for dizziness and tremors.  All other systems reviewed and are negative.   Per HPI unless specifically indicated above   Allergies as of 12/07/2023   No Known Allergies      Medication List        Accurate as of December 07, 2023  9:50 AM. If you have any questions, ask your nurse or doctor.          acetaminophen  325 MG tablet Commonly known as: TYLENOL  Take 1-2 tablets (325-650 mg total) by mouth every 4 (four) hours as needed for mild pain. What changed:  how much to take when to take this additional instructions   albuterol  108 (90 Base) MCG/ACT inhaler Commonly known as: VENTOLIN  HFA INHALE 2 PUFFS EVERY 6 HOURS  AS NEEDED FOR WHEEZING OR SHORTNESS OF BREATH   amLODipine  5 MG tablet Commonly known as: NORVASC  TAKE ONE TABLET BY MOUTH DAILY   guaiFENesin  600 MG 12 hr tablet Commonly known as: MUCINEX  Take 600 mg by mouth every 4 (four) hours as needed for cough or to loosen phlegm.   levothyroxine  50 MCG tablet Commonly known as: SYNTHROID  TAKE ONE TABLET BY MOUTH DAILY   methocarbamol  500 MG tablet Commonly known as: ROBAXIN  Take 1 tablet (500 mg total) by mouth daily as needed for muscle spasms.   metoprolol   succinate 50 MG 24 hr tablet Commonly known as: TOPROL -XL TAKE ONE TABLET AT BEDTIME   montelukast  10 MG tablet Commonly known as: SINGULAIR  Take 1 tablet (10 mg total) by mouth daily.   Olopatadine  HCl 0.2 % Soln Place 1 drop into both eyes daily as needed (allergies).   oxyCODONE -acetaminophen  5-325 MG tablet Commonly known as: Percocet Take 1-2 tablets by mouth every 6 (six) hours as needed.   pantoprazole  40 MG tablet Commonly known as: PROTONIX  TAKE 1 TABLET DAILY   rosuvastatin  20 MG tablet Commonly known as: CRESTOR  TAKE ONE TABLET ONCE DAILY   warfarin 5 MG tablet Commonly known as: COUMADIN  Take as directed by the anticoagulation clinic. If you are unsure how to take this medication, talk to your nurse or doctor. Original instructions: TAKE ONE TABLET ONCE DAILY AT 4PM         Objective:   BP 128/74   Pulse 65   Ht 5' 8 (1.727 m)   Wt 176 lb (79.8 kg)   SpO2 97%   BMI 26.76 kg/m   Wt Readings from Last 3 Encounters:  12/07/23 176 lb (79.8 kg)  10/10/23 178 lb (80.7 kg)  07/24/23 182 lb (82.6 kg)    Physical Exam Vitals and nursing note reviewed.  Constitutional:      Appearance: Normal appearance.  Cardiovascular:     Rate and Rhythm: Normal rate and regular rhythm.     Heart sounds: No murmur heard. Pulmonary:     Effort: Pulmonary effort is normal. No respiratory distress.     Breath sounds: No wheezing or rales.  Chest:     Chest wall: No tenderness.  Neurological:     General: No focal deficit present.     Mental Status: He is alert and oriented to person, place, and time.     Cranial Nerves: No cranial nerve deficit.     Gait: Gait abnormal.    Physical Exam   CARDIOVASCULAR: Faint bruit in bilateral carotid arteries.         Assessment & Plan:   Problem List Items Addressed This Visit   None Visit Diagnoses       Dizziness    -  Primary   Relevant Orders   Ambulatory referral to Neurology     Balance disorder        Relevant Orders   Ambulatory referral to Neurology     Tremor       Relevant Orders   Ambulatory referral to Neurology     Bilateral carotid bruits       Relevant Orders   US  Carotid Duplex Bilateral     Globus sensation              Dizziness and balance disturbance Persistent dizziness and balance disturbance with no improvement after cardiology adjustments. Possible vestibular abnormalities considered. Hydration status may be a contributing factor. - Refer to neurology for evaluation of vestibular  abnormalities. - Encourage increased fluid intake, aiming for 64 ounces of water daily, gradually increasing from current intake. - Suggest using flavored water or low-sugar Gatorade to improve palatability.  Tremor and right hand dysfunction Intermittent tremor in the right arm, exacerbated by lack of concentration. Difficulty with writing due to lack of coordination. Differential includes benign tremor versus other neurological causes. - Refer to neurology for evaluation of tremor and right hand dysfunction.  Excessive drooling and phlegm production Excessive drooling and phlegm production, particularly after eating and in the morning. Symptoms may be related to acid reflux or allergies. - Increase Protonix  (pantoprazole ) to twice daily for one week to assess for improvement in symptoms.  Bilateral carotid artery bruit Faint bruit noted in both carotid arteries. Last carotid ultrasound was in 2021. - Order carotid ultrasound to assess current status of carotid arteries.  Hyperlipidemia Hyperlipidemia managed with rosuvastatin . No current refills available. - Send prescription for rosuvastatin  20 mg.  Dehydration risk due to low fluid intake and diuretic use Risk of dehydration due to low fluid intake and consumption of diuretics such as caffeine. Current fluid intake is insufficient. - Encourage increased fluid intake, aiming for 64 ounces of water daily, gradually increasing from  current intake. - Suggest using flavored water or low-sugar Gatorade to improve palatability.          Follow up plan: Return if symptoms worsen or fail to improve.  Counseling provided for all of the vaccine components Orders Placed This Encounter  Procedures   US  Carotid Duplex Bilateral   Ambulatory referral to Neurology    Fonda Levins, MD [facility] 12/07/2023, 9:50 AM

## 2023-12-07 NOTE — Addendum Note (Signed)
 Addended by: MARYANNE CHEW on: 12/07/2023 10:28 AM   Modules accepted: Orders

## 2024-01-04 ENCOUNTER — Ambulatory Visit: Attending: Internal Medicine

## 2024-01-04 DIAGNOSIS — Z9889 Other specified postprocedural states: Secondary | ICD-10-CM

## 2024-01-04 DIAGNOSIS — I71019 Dissection of thoracic aorta, unspecified: Secondary | ICD-10-CM | POA: Diagnosis not present

## 2024-01-04 DIAGNOSIS — Z5181 Encounter for therapeutic drug level monitoring: Secondary | ICD-10-CM

## 2024-01-04 LAB — POCT INR: INR: 3.1 — AB (ref 2.0–3.0)

## 2024-01-04 NOTE — Progress Notes (Signed)
 INR 3.1 Please see anticoagulation encounter Continue taking Warfarin 1/2 tablet daily except for 1 tablet on Mondays, Wednesdays, and Fridays. Eat greens tonight. Recheck INR in 6 weeks. Coumadin  Clinic [phone]

## 2024-01-04 NOTE — Patient Instructions (Signed)
 Continue taking Warfarin 1/2 tablet daily except for 1 tablet on Mondays, Wednesdays, and Fridays. Eat greens tonight. Recheck INR in 6 weeks. Coumadin  Clinic [phone]

## 2024-01-19 ENCOUNTER — Ambulatory Visit

## 2024-02-08 ENCOUNTER — Ambulatory Visit (HOSPITAL_BASED_OUTPATIENT_CLINIC_OR_DEPARTMENT_OTHER)
Admission: RE | Admit: 2024-02-08 | Discharge: 2024-02-08 | Disposition: A | Discharge status: Home / Self Care | Source: Ambulatory Visit | Attending: Family Medicine | Admitting: Family Medicine

## 2024-02-08 DIAGNOSIS — R0989 Other specified symptoms and signs involving the circulatory and respiratory systems: Secondary | ICD-10-CM | POA: Diagnosis present

## 2024-02-09 ENCOUNTER — Ambulatory Visit: Payer: Self-pay | Admitting: Family Medicine

## 2024-02-11 NOTE — Progress Notes (Unsigned)
 Cardiology Office Note:    Date:  02/11/2024   ID:  Patrick Bell, DOB 1940/11/24, MRN [id number]  PCP:  Dettinger, Fonda LABOR, MD  Cardiologist:  Lonni LITTIE Nanas, MD  Electrophysiologist:  None   Referring MD: Dettinger, Fonda LABOR, MD   No chief complaint on file. ***  History of Present Illness:    Patrick Bell is a 83 y.o. male with a hx of  mitral regurgitation status post mitral valve repair in 2021, procedure complicated by type A aortic dissection status post repair, asthma, OSA, hypertension, atrial fibrillation, hyperlipidemia who presents for follow-up.  Previously followed with Dr. Burnard.  LHC prior to mitral valve repair 02/2020 showed normal coronary arteries.  Echocardiogram 10/09/2023 showed EF 55 to 60%, mildly reduced RV function, status post mitral valve repair with mild mitral regurgitation.  Past Medical History:  Diagnosis Date   Acute thoracic aortic dissection (HCC) 03/11/2020   Type A   Allergy    Asthma    Cataract    Cataract    Diverticulitis    Dyspnea    GERD (gastroesophageal reflux disease)    Heart murmur 12/2019   Hyperlipidemia    Hypertension    Inguinal hernia bilateral, non-recurrent    S/P aortic dissection repair 03/11/2020   supracoronary straight graft repair with resuspension of native aortic valve and open distal anastomosis for intraoperative acute type A aortic dissection    S/P mitral valve repair 03/11/2020   Complex valvuloplasty including artificial Gore-tex neochord placement x 4, suture plication of posterior leaflet and posteromedial commissure and 32 mm Sorin Memo 4D ring annuloplasty   Sleep apnea     Past Surgical History:  Procedure Laterality Date   bilateral inguinal hernia     CARDIAC CATHETERIZATION Bilateral 02/21/2020   CHEST TUBE INSERTION N/A 03/22/2020   Procedure: CHEST TUBE INSERTION OF 28 BLAKE DRAIN.;  Surgeon: Army Dallas NOVAK, MD;  Location: [facility] OR;  Service: Thoracic;  Laterality: N/A;    COLONOSCOPY  06/09/2004   WLM:Dphfnpi colon diverticulosis, otherwise normal colonoscopy   EYE SURGERY  08/2014   HERNIA REPAIR     MITRAL VALVE REPAIR N/A 03/11/2020   Procedure: MITRAL VALVE REPAIR (MVR) USING MEMO 4D SIZE 32 RING;  Surgeon: Dusty Sudie DEL, MD;  Location: [facility] OR;  Service: Open Heart Surgery;  Laterality: N/A;   PALATE / UVULA BIOPSY / EXCISION     PERICARDIAL WINDOW N/A 03/22/2020   Procedure: SUBXYPHOID POST-OP PERICARDIAL FLUID DRAINAGE WITH CHEST TUBE INSERTION.;  Surgeon: Army Dallas NOVAK, MD;  Location: [facility] OR;  Service: Thoracic;  Laterality: N/A;   REPAIR OF ACUTE ASCENDING THORACIC AORTIC DISSECTION N/A 03/11/2020   Procedure: REPAIR OF ACUTE ASCENDING THORACIC AORTIC DISSECTION USING A HEMASHIELD PLATINUM STRAIGHT GRAFT AND A HEASHIELD PLATINUM 28X10MM SINGLE ARM GRAFT;  Surgeon: Dusty Sudie DEL, MD;  Location: [facility] OR;  Service: Open Heart Surgery;  Laterality: N/A;   RIGHT/LEFT HEART CATH AND CORONARY ANGIOGRAPHY N/A 02/21/2020   Procedure: RIGHT/LEFT HEART CATH AND CORONARY ANGIOGRAPHY;  Surgeon: Burnard Debby LABOR, MD;  Location: [facility] INVASIVE CV LAB;  Service: Cardiovascular;  Laterality: N/A;   TEE WITHOUT CARDIOVERSION N/A 12/19/2019   Procedure: TRANSESOPHAGEAL ECHOCARDIOGRAM (TEE);  Surgeon: Lonni Slain, MD;  Location: [facility] ENDOSCOPY;  Service: Cardiovascular;  Laterality: N/A;   TEE WITHOUT CARDIOVERSION N/A 03/11/2020   Procedure: TRANSESOPHAGEAL ECHOCARDIOGRAM (TEE);  Surgeon: Dusty Sudie DEL, MD;  Location: [facility] OR;  Service: Open Heart Surgery;  Laterality: N/A;   TEE  WITHOUT CARDIOVERSION N/A 03/22/2020   Procedure: TRANSESOPHAGEAL ECHOCARDIOGRAM (TEE);  Surgeon: Army Dallas NOVAK, MD;  Location: [facility] OR;  Service: Thoracic;  Laterality: N/A;   TONSILLECTOMY     VASECTOMY      Current Medications: No outpatient medications have been marked as taking for the 02/12/24 encounter (Appointment) with Kate Lonni CROME, MD.     Allergies:    Patient has no known allergies.   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  Tobacco Use   Smoking status: Former    Current packs/day: 0.00    Average packs/day: 4.0 packs/day for 25.0 years (100.0 ttl pk-yrs)    Types: Cigarettes    Start date: 06/22/1957    Quit date: 06/23/1982    Years since quitting: 41.6   Smokeless tobacco: Never   Tobacco comments:    pt was a truck Psychiatric Nurse   Vaping status: Never Used  Substance and Sexual Activity   Alcohol use: Yes    Alcohol/week: 5.0 standard drinks of alcohol    Types: 5 Shots of liquor per week    Comment: brandy a few times a month, beer once a month   Drug use: No   Sexual activity: Yes    Birth control/protection: Surgical  Other Topics Concern   Not on file  Social History Narrative   Not on file   Social Drivers of Health   Financial Resource Strain: Low Risk  (01/11/2023)   Overall Financial Resource Strain (CARDIA)    Difficulty of Paying Living Expenses: Not hard at all  Food Insecurity: No Food Insecurity (01/11/2023)   Hunger Vital Sign    Worried About Running Out of Food in the Last Year: Never true    Ran Out of Food in the Last Year: Never true  Transportation Needs: No Transportation Needs (01/11/2023)   PRAPARE - Administrator, Civil Service (Medical): No    Lack of Transportation (Non-Medical): No  Physical Activity: Insufficiently Active (01/11/2023)   Exercise Vital Sign    Days of Exercise per Week: 3 days    Minutes of Exercise per Session: 30 min  Stress: No Stress Concern Present (01/11/2023)   [employer] of Occupational Health - Occupational Stress Questionnaire    Feeling of Stress : Not at all  Social Connections:  Moderately Integrated (01/11/2023)   Social Connection and Isolation Panel    Frequency of Communication with Friends and Family: More than three times a week    Frequency of Social Gatherings with Friends and Family: More than three times a week    Attends Religious Services: More than 4 times per year    Active Member of [employer] or Organizations: No    Attends Engineer, Structural: Never    Marital Status: Married     Family History: The patient's ***family history includes Arthritis in his brother, father, and sister; Dementia (age of onset: 34) in his mother; Heart disease in his father and mother; Hip fracture (age of onset: 36) in his mother; Osteoporosis in his mother. There is no history of Colon cancer.  ROS:   Please see the history of present illness.    *** All other  systems reviewed and are negative.  EKGs/Labs/Other Studies Reviewed:    The following studies were reviewed today: ***  EKG:  EKG is *** ordered today.  The ekg ordered today demonstrates ***  Recent Labs: 07/24/2023: ALT 11; BUN 15; Creatinine, Ser 1.08; Hemoglobin 14.2; Platelets 190; Potassium 4.3; Sodium 139; TSH 1.060  Recent Lipid Panel    Component Value Date/Time   CHOL 144 07/24/2023 1053   CHOL 168 06/18/2012 1031   TRIG 69 07/24/2023 1053   TRIG 101 01/14/2013 1218   TRIG 102 06/18/2012 1031   HDL 69 07/24/2023 1053   HDL 65 01/14/2013 1218   HDL 62 06/18/2012 1031   CHOLHDL 2.1 07/24/2023 1053   LDLCALC 61 07/24/2023 1053   LDLCALC 99 01/14/2013 1218   LDLCALC 86 06/18/2012 1031    Physical Exam:    VS:  There were no vitals taken for this visit.    Wt Readings from Last 3 Encounters:  12/07/23 176 lb (79.8 kg)  10/10/23 178 lb (80.7 kg)  07/24/23 182 lb (82.6 kg)     GEN: *** Well nourished, well developed in no acute distress HEENT: Normal NECK: No JVD; No carotid bruits LYMPHATICS: No lymphadenopathy CARDIAC: ***RRR, no murmurs, rubs, gallops RESPIRATORY:  Clear  to auscultation without rales, wheezing or rhonchi  ABDOMEN: Soft, non-tender, non-distended MUSCULOSKELETAL:  No edema; No deformity  SKIN: Warm and dry NEUROLOGIC:  Alert and oriented x 3 PSYCHIATRIC:  Normal affect   ASSESSMENT:    No diagnosis found. PLAN:    Paroxysmal atrial fibrillation: CHA2DS2-VASc 3 (hypertension, age x 2) - On Coumadin *** - Continue Toprol -XL 50 mg daily  Mitral regurgitation status post mitral valve repair in 2021: Echocardiogram 10/09/2023 showed EF 55 to 60%, mildly reduced RV function, status post mitral valve repair with mild mitral regurgitation.  Type A aortic dissection: Mitral valve repair was complicated by aortic dissection status post repair.  Has residual chronic distal aortic dissection, follows with Dr. Lucas  Hypertension: On amlodipine  5 mg daily, Toprol -XL 50 mg daily  Hyperlipidemia: On rosuvastatin  20 mg daily  OSA: On CPAP  RTC in***  Medication Adjustments/Labs and Tests Ordered: Current medicines are reviewed at length with the patient today.  Concerns regarding medicines are outlined above.  No orders of the defined types were placed in this encounter.  No orders of the defined types were placed in this encounter.   There are no Patient Instructions on file for this visit.   Signed, Lonni LITTIE Nanas, MD  02/11/2024 3:00 PM    [facility]

## 2024-02-12 ENCOUNTER — Ambulatory Visit

## 2024-02-12 ENCOUNTER — Ambulatory Visit: Admitting: *Deleted

## 2024-02-12 ENCOUNTER — Ambulatory Visit: Attending: Cardiology | Admitting: Cardiology

## 2024-02-12 ENCOUNTER — Encounter: Payer: Self-pay | Admitting: Cardiology

## 2024-02-12 VITALS — BP 129/75 | HR 66 | Ht 68.0 in | Wt 178.4 lb

## 2024-02-12 DIAGNOSIS — R42 Dizziness and giddiness: Secondary | ICD-10-CM

## 2024-02-12 DIAGNOSIS — I71019 Dissection of thoracic aorta, unspecified: Secondary | ICD-10-CM

## 2024-02-12 DIAGNOSIS — I10 Essential (primary) hypertension: Secondary | ICD-10-CM | POA: Diagnosis not present

## 2024-02-12 DIAGNOSIS — I480 Paroxysmal atrial fibrillation: Secondary | ICD-10-CM

## 2024-02-12 DIAGNOSIS — E785 Hyperlipidemia, unspecified: Secondary | ICD-10-CM

## 2024-02-12 DIAGNOSIS — Z9889 Other specified postprocedural states: Secondary | ICD-10-CM

## 2024-02-12 LAB — POCT INR: INR: 2.6 (ref 2.0–3.0)

## 2024-02-12 MED ORDER — METOPROLOL SUCCINATE ER 25 MG PO TB24: 25.0000 mg | ORAL_TABLET | Freq: Every day | ORAL | 3 refills | Status: AC

## 2024-02-12 NOTE — Progress Notes (Unsigned)
 Enrolled patient for a 14 day Zio XT  monitor to be mailed to patients home

## 2024-02-12 NOTE — Patient Instructions (Addendum)
 Medication Instructions:  DECREASE METOPROLOL  SUCCINATE TO 25 MG DAILY.  Lab Work: NONE TO BE DONE TODAY.  Testing/Procedures: GEOFFRY HEWS- Long Term Monitor Instructions  Your physician has requested you wear a ZIO patch monitor for 14 days.  This is a single patch monitor. [employer] supplies one patch monitor per enrollment. Additional stickers are not available. Please do not apply patch if you will be having a Nuclear Stress Test,  Echocardiogram, Cardiac CT, MRI, or Chest Xray during the period you would be wearing the  monitor. The patch cannot be worn during these tests. You cannot remove and re-apply the  ZIO XT patch monitor.  Your ZIO patch monitor will be mailed 3 day USPS to your address on file. It may take 3-5 days  to receive your monitor after you have been enrolled.  Once you have received your monitor, please review the enclosed instructions. Your monitor  has already been registered assigning a specific monitor serial # to you.  Billing and Patient Assistance Program Information  We have supplied [employer] with any of your insurance information on file for billing purposes. [employer] offers a sliding scale Patient Assistance Program for patients that do not have  insurance, or whose insurance does not completely cover the cost of the ZIO monitor.  You must apply for the Patient Assistance Program to qualify for this discounted rate.  To apply, please call [employer] at [phone], select option 4, select option 2, ask to apply for  Patient Assistance Program. Meredeth will ask your household income, and how many people  are in your household. They will quote your out-of-pocket cost based on that information.  [employer] will also be able to set up a 29-month, interest-free payment plan if needed.  Applying the monitor   Shave hair from upper left chest.  Hold abrader disc by orange tab. Rub abrader in 40 strokes over the upper left chest as  indicated in your monitor  instructions.  Clean area with 4 enclosed alcohol pads. Let dry.  Apply patch as indicated in monitor instructions. Patch will be placed under collarbone on left  side of chest with arrow pointing upward.  Rub patch adhesive wings for 2 minutes. Remove white label marked 1. Remove the white  label marked 2. Rub patch adhesive wings for 2 additional minutes.  While looking in a mirror, press and release button in center of patch. A small green light will  flash 3-4 times. This will be your only indicator that the monitor has been turned on.  Do not shower for the first 24 hours. You may shower after the first 24 hours.  Press the button if you feel a symptom. You will hear a small click. Record Date, Time and  Symptom in the Patient Logbook.  When you are ready to remove the patch, follow instructions on the last 2 pages of Patient  Logbook. Stick patch monitor onto the last page of Patient Logbook.  Place Patient Logbook in the blue and white box. Use locking tab on box and tape box closed  securely. The blue and white box has prepaid postage on it. Please place it in the mailbox as  soon as possible. Your physician should have your test results approximately 7 days after the  monitor has been mailed back to [facility].  Call [facility] Customer Care at [phone] if you have questions regarding  your ZIO XT patch monitor. Call them immediately if you see an orange light blinking on your  monitor.  If your monitor falls off in less than 4 days, contact our Monitor department at [phone].  If your monitor becomes loose or falls off after 4 days call [employer] at [phone] for  suggestions on securing your monitor   Follow-Up: At [facility], you and your health needs are our priority.  As part of our continuing mission to provide you with exceptional heart care, our providers are all part of one team.  This team includes your primary Cardiologist (physician)  and Advanced Practice Providers or APPs (Physician Assistants and Nurse Practitioners) who all work together to provide you with the care you need, when you need it.  Your next appointment:   4 MONTHS  Provider:   Lonni LITTIE Nanas, MD      Other Instructions: PLEASE TAKE YOUR BLOOD PRESSURE 2 TIMES DAILY AND RECORD BOTH BLOOD PRESSURE AND HEART RATE FOR 1 WEEK. AFTER 1 WEEK SEND THE BLOOD PRESSURE READING VIA MYCHART.

## 2024-02-12 NOTE — Patient Instructions (Signed)
 Description   INR-2.6; Continue taking Warfarin 1/2 tablet daily except for 1 tablet on Mondays, Wednesdays, and Fridays. Recheck INR in 6 weeks. Coumadin  Clinic [phone]

## 2024-02-12 NOTE — Progress Notes (Signed)
 Description   INR-2.6; Continue taking Warfarin 1/2 tablet daily except for 1 tablet on Mondays, Wednesdays, and Fridays. Recheck INR in 6 weeks. Coumadin  Clinic [phone]

## 2024-02-14 ENCOUNTER — Other Ambulatory Visit: Payer: Self-pay | Admitting: Surgery

## 2024-02-14 DIAGNOSIS — I7101 Dissection of ascending aorta: Secondary | ICD-10-CM

## 2024-03-14 ENCOUNTER — Telehealth: Payer: Self-pay | Admitting: Cardiology

## 2024-03-14 NOTE — Telephone Encounter (Signed)
 Spoke with pt regarding his blood pressures. Pt stated Dr. Kate had asked him to send us  his blood pressure readings. Pt stated his last 5 blood pressures are as follows:  12/11: 149/83  12/9: 153/83  12/9: 155/90  12/8: 143/81  12/8: 153/89  Pt stated he is concerned that his blood pressure is too high. The pt reports he has dizziness, but denies any blurred vision, headache, nausea or vomiting. The pt stated he has refrained from driving due to his dizziness. Pt was told to continue to avoid driving and that Dr. Kate would be notified of his readings and symptom of dizziness. Pt was given ED precautions. Pt verbalized understanding. All questions if any were answered.

## 2024-03-14 NOTE — Telephone Encounter (Signed)
 Pt c/o BP issue: STAT if pt c/o blurred vision, one-sided weakness or slurred speech.  STAT if BP is GREATER than 180/120 TODAY.  STAT if BP is LESS than 90/60 and SYMPTOMATIC TODAY  1. What is your BP concern? Pt concerned his bp is high   2. Have you taken any BP medication today? No- he takes it at night   3. What are your last 5 BP readings?   12/11: 149/83  12/9: 153/83  12/9: 155/90  12/8: 143/81  12/8: 153/89   4. Are you having any other symptoms (ex. Dizziness, headache, blurred vision, passed out)? No

## 2024-03-14 NOTE — Telephone Encounter (Signed)
 BP elevated, recommend increasing amlodipine  dose to 10 mg daily.  Check BP daily for next 2 weeks and let us  know results

## 2024-03-15 MED ORDER — AMLODIPINE BESYLATE 10 MG PO TABS: 10.0000 mg | ORAL_TABLET | Freq: Every day | ORAL | 3 refills | Status: AC

## 2024-03-15 NOTE — Telephone Encounter (Signed)
 Called and made patient aware of Dr. Kate recommendations to increase Amlodipine  10 mg daily and check blood pressure daily for 2 weeks and send those readings. Understanding verbalized.

## 2024-03-19 ENCOUNTER — Other Ambulatory Visit: Payer: Self-pay | Admitting: Surgery

## 2024-03-19 ENCOUNTER — Telehealth: Payer: Self-pay | Admitting: Cardiology

## 2024-03-19 DIAGNOSIS — I7101 Dissection of ascending aorta: Secondary | ICD-10-CM

## 2024-03-19 NOTE — Telephone Encounter (Signed)
 Pt also requesting to have an appt sooner than his 4 month F/u to discuss BP medication.

## 2024-03-19 NOTE — Telephone Encounter (Signed)
 Spoke to patient he stated he had trouble sending B/P readings via mychart.B/P readings since he increased Amlodipine  to 10 mg daily-151/87,147/82,138/81,131/72,137/78,136/84,135/84,125/90,145/68.Pulse 71 to 73.Stated he is feeling well.No swelling in feet and lower legs.He requested a sooner appointment with Dr.Schumann.Appointment scheduled with Dr.Schumann 1/23 at 11:20 am.I will send message to Dr.Schumann to review B/P readings.

## 2024-03-19 NOTE — Telephone Encounter (Signed)
 Pt calling to give BP readings, requested to give directly to nurse. Please advise.

## 2024-03-20 ENCOUNTER — Ambulatory Visit (HOSPITAL_COMMUNITY)

## 2024-03-20 NOTE — Telephone Encounter (Signed)
 Spoke to patient Dr.Schumann's advice given.Scheduler will call back with Pharm D hypertension clinic appointment.

## 2024-03-20 NOTE — Telephone Encounter (Signed)
 BP intermittently elevated, recommend scheduling in pharmacy hypertension clinic, would check BP twice daily and bring log and home BP monitor to calibrate to appointment

## 2024-03-21 ENCOUNTER — Ambulatory Visit: Payer: Self-pay

## 2024-03-21 VITALS — BP 129/75 | HR 66 | Ht 68.0 in | Wt 178.0 lb

## 2024-03-21 DIAGNOSIS — Z0000 Encounter for general adult medical examination without abnormal findings: Secondary | ICD-10-CM

## 2024-03-21 NOTE — Progress Notes (Signed)
 Chief Complaint  Patient presents with   Medicare Wellness     Subjective:   Patrick Bell is a 83 y.o. male who presents for a Medicare Annual Wellness Visit.  Visit info / Clinical Intake: Medicare Wellness Visit Type:: Subsequent Annual Wellness Visit Persons participating in visit and providing information:: patient Medicare Wellness Visit Mode:: Telephone If telephone:: video declined Since this visit was completed virtually, some vitals may be partially provided or unavailable. Missing vitals are due to the limitations of the virtual format.: Unable to obtain vitals - no equipment (vitals from prev OV w/Cardo 02/12/24) If Telephone or Video please confirm:: I connected with patient using audio/video enable telemedicine. I verified patient identity with two identifiers, discussed telehealth limitations, and patient agreed to proceed. Patient Location:: home Provider Location:: home office Interpreter Needed?: No Pre-visit prep was completed: yes AWV questionnaire completed by patient prior to visit?: no Living arrangements:: lives with spouse/significant other Patient's Overall Health Status Rating: very good Typical amount of pain: none Does pain affect daily life?: no Are you currently prescribed opioids?: no  Dietary Habits and Nutritional Risks How many meals a day?: 2 Eats fruit and vegetables daily?: yes Most meals are obtained by: preparing own meals In the last 2 weeks, have you had any of the following?: none Diabetic:: no  Functional Status Activities of Daily Living (to include ambulation/medication): Independent Ambulation: Independent with device- listed below Home Assistive Devices/Equipment: Johna Finder (specify Type) Medication Administration: Independent Home Management (perform basic housework or laundry): Independent Manage your own finances?: yes Primary transportation is: driving Concerns about vision?: no *vision screening is required for  WTM* (upcoming apt next week Dr. Elnor in [place], [place]) Concerns about hearing?: no  Fall Screening Falls in the past year?: 1 Number of falls in past year: 0 Was there an injury with Fall?: 0 Fall Risk Category Calculator: 1 Patient Fall Risk Level: Low Fall Risk  Fall Risk Patient at Risk for Falls Due to: Impaired balance/gait; Impaired mobility Fall risk Follow up: Falls evaluation completed; Education provided  Home and Transportation Safety: All rugs have non-skid backing?: yes All stairs or steps have railings?: yes Grab bars in the bathtub or shower?: yes Have non-skid surface in bathtub or shower?: yes Good home lighting?: yes Regular seat belt use?: yes Hospital stays in the last year:: no  Cognitive Assessment Difficulty concentrating, remembering, or making decisions? : yes (notice some changes in Concentration) Will 6CIT or Mini Cog be Completed: no  Advance Directives (For Healthcare) Does Patient Have a Medical Advance Directive?: No Would patient like information on creating a medical advance directive?: No - Patient declined  Reviewed/Updated  Reviewed/Updated: Reviewed All (Medical, Surgical, Family, Medications, Allergies, Care Teams, Patient Goals); Medical History; Surgical History; Family History; Medications; Allergies; Care Teams; Patient Goals    Allergies (verified) Patient has no known allergies.   Current Medications (verified) Outpatient Encounter Medications as of 03/21/2024  Medication Sig   acetaminophen  (TYLENOL ) 325 MG tablet Take 1-2 tablets (325-650 mg total) by mouth every 4 (four) hours as needed for mild pain. (Patient taking differently: Take 2,000 mg by mouth daily. Taking 1500 to 2000 mg a day)   albuterol  (VENTOLIN  HFA) 108 (90 Base) MCG/ACT inhaler INHALE 2 PUFFS EVERY 6 HOURS AS NEEDED FOR WHEEZING OR SHORTNESS OF BREATH   amLODipine  (NORVASC ) 10 MG tablet Take 1 tablet (10 mg total) by mouth daily.   guaiFENesin   (MUCINEX ) 600 MG 12 hr tablet Take 600 mg by mouth  every 4 (four) hours as needed for cough or to loosen phlegm.   levothyroxine  (SYNTHROID ) 50 MCG tablet TAKE ONE TABLET BY MOUTH DAILY   methocarbamol  (ROBAXIN ) 500 MG tablet Take 1 tablet (500 mg total) by mouth daily as needed for muscle spasms.   metoprolol  succinate (TOPROL -XL) 25 MG 24 hr tablet Take 1 tablet (25 mg total) by mouth daily. TAKE ONE TABLET AT BEDTIME   montelukast  (SINGULAIR ) 10 MG tablet Take 1 tablet (10 mg total) by mouth daily.   Olopatadine  HCl 0.2 % SOLN Place 1 drop into both eyes daily as needed (allergies).   pantoprazole  (PROTONIX ) 40 MG tablet Take 1 tablet (40 mg total) by mouth 2 (two) times daily before a meal for 14 days.   rosuvastatin  (CRESTOR ) 20 MG tablet Take 1 tablet (20 mg total) by mouth daily.   warfarin (COUMADIN ) 5 MG tablet TAKE ONE TABLET ONCE DAILY AT 4PM   No facility-administered encounter medications on file as of 03/21/2024.    History: Past Medical History:  Diagnosis Date   Acute thoracic aortic dissection (HCC) 03/11/2020   Type A   Allergy    Asthma    Cataract    Cataract    Diverticulitis    Dyspnea    GERD (gastroesophageal reflux disease)    Heart murmur 12/2019   Hyperlipidemia    Hypertension    Inguinal hernia bilateral, non-recurrent    S/P aortic dissection repair 03/11/2020   supracoronary straight graft repair with resuspension of native aortic valve and open distal anastomosis for intraoperative acute type A aortic dissection    S/P mitral valve repair 03/11/2020   Complex valvuloplasty including artificial Gore-tex neochord placement x 4, suture plication of posterior leaflet and posteromedial commissure and 32 mm Sorin Memo 4D ring annuloplasty   Sleep apnea    Past Surgical History:  Procedure Laterality Date   bilateral inguinal hernia     CARDIAC CATHETERIZATION Bilateral 02/21/2020   CHEST TUBE INSERTION N/A 03/22/2020   Procedure: CHEST TUBE INSERTION OF  28 BLAKE DRAIN.;  Surgeon: Army Dallas NOVAK, MD;  Location: [facility] OR;  Service: Thoracic;  Laterality: N/A;   COLONOSCOPY  06/09/2004   WLM:Dphfnpi colon diverticulosis, otherwise normal colonoscopy   EYE SURGERY  08/2014   HERNIA REPAIR     MITRAL VALVE REPAIR N/A 03/11/2020   Procedure: MITRAL VALVE REPAIR (MVR) USING MEMO 4D SIZE 32 RING;  Surgeon: Dusty Sudie DEL, MD;  Location: [facility] OR;  Service: Open Heart Surgery;  Laterality: N/A;   PALATE / UVULA BIOPSY / EXCISION     PERICARDIAL WINDOW N/A 03/22/2020   Procedure: SUBXYPHOID POST-OP PERICARDIAL FLUID DRAINAGE WITH CHEST TUBE INSERTION.;  Surgeon: Army Dallas NOVAK, MD;  Location: [facility] OR;  Service: Thoracic;  Laterality: N/A;   REPAIR OF ACUTE ASCENDING THORACIC AORTIC DISSECTION N/A 03/11/2020   Procedure: REPAIR OF ACUTE ASCENDING THORACIC AORTIC DISSECTION USING A HEMASHIELD PLATINUM STRAIGHT GRAFT AND A HEASHIELD PLATINUM 28X10MM SINGLE ARM GRAFT;  Surgeon: Dusty Sudie DEL, MD;  Location: [facility] OR;  Service: Open Heart Surgery;  Laterality: N/A;   RIGHT/LEFT HEART CATH AND CORONARY ANGIOGRAPHY N/A 02/21/2020   Procedure: RIGHT/LEFT HEART CATH AND CORONARY ANGIOGRAPHY;  Surgeon: Burnard Debby LABOR, MD;  Location: [facility] INVASIVE CV LAB;  Service: Cardiovascular;  Laterality: N/A;   TEE WITHOUT CARDIOVERSION N/A 12/19/2019   Procedure: TRANSESOPHAGEAL ECHOCARDIOGRAM (TEE);  Surgeon: Lonni Slain, MD;  Location: [facility] ENDOSCOPY;  Service: Cardiovascular;  Laterality: N/A;   TEE WITHOUT CARDIOVERSION N/A  03/11/2020   Procedure: TRANSESOPHAGEAL ECHOCARDIOGRAM (TEE);  Surgeon: Dusty Sudie DEL, MD;  Location: [facility] OR;  Service: Open Heart Surgery;  Laterality: N/A;   TEE WITHOUT CARDIOVERSION N/A 03/22/2020   Procedure: TRANSESOPHAGEAL ECHOCARDIOGRAM (TEE);  Surgeon: Army Dallas NOVAK, MD;  Location: [facility] OR;  Service: Thoracic;  Laterality: N/A;   TONSILLECTOMY     VASECTOMY     Family History  Problem Relation Age of Onset   Heart disease  Mother    Hip fracture Mother 50   Osteoporosis Mother    Dementia Mother 43   Arthritis Father    Heart disease Father    Arthritis Sister    Arthritis Brother    Colon cancer Neg Hx    Social History   Occupational History   Occupation: retired    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  Tobacco Use   Smoking status: Former    Current packs/day: 0.00    Average packs/day: 4.0 packs/day for 25.0 years (100.0 ttl pk-yrs)    Types: Cigarettes    Start date: 06/22/1957    Quit date: 06/23/1982    Years since quitting: 41.7   Smokeless tobacco: Never   Tobacco comments:    pt was a truck Psychiatric Nurse   Vaping status: Never Used  Substance and Sexual Activity   Alcohol use: Yes    Alcohol/week: 5.0 standard drinks of alcohol    Types: 5 Shots of liquor per week    Comment: brandy a few times a month, beer once a month   Drug use: No   Sexual activity: Yes    Birth control/protection: Surgical   Tobacco Counseling Counseling given: Yes Tobacco comments: pt was a truck Adult Nurse Insecurity: No Food Insecurity (03/21/2024)  Housing: Unknown (03/21/2024)  Transportation Needs: No Transportation Needs (03/21/2024)  Utilities: Not At Risk (03/21/2024)  Alcohol Screen: Low Risk (01/11/2023)  Depression (PHQ2-9): Low Risk (03/21/2024)  Financial Resource Strain: Low Risk (01/11/2023)  Physical Activity: Insufficiently Active (03/21/2024)  Social Connections: Moderately Integrated (03/21/2024)  Stress: No Stress Concern Present (03/21/2024)  Tobacco Use: Medium Risk (03/21/2024)  Health Literacy: Adequate Health Literacy (03/21/2024)   See flowsheets for full screening details  Depression Screen PHQ 2 & 9 Depression Scale- Over the past 2 weeks, how often have you been bothered by any of the following problems? Little interest or pleasure in doing things: 0 Feeling down, depressed, or hopeless (PHQ Adolescent also  includes...irritable): 0 PHQ-2 Total Score: 0 Trouble falling or staying asleep, or sleeping too much: 0 Feeling tired or having little energy: 1 Poor appetite or overeating (PHQ Adolescent also includes...weight loss): 0 Feeling bad about yourself - or that you are a failure or have let yourself or your family down: 0 Trouble concentrating on things, such as reading the newspaper or watching television (PHQ Adolescent also includes...like school work): 1 Moving or speaking so slowly that other people could have noticed. Or the opposite - being so fidgety or restless that you have been moving around a lot more than usual: 1 Thoughts that you would be better off dead, or of hurting yourself in some way: 0 PHQ-9 Total Score: 6 If you checked off any problems, how difficult have these problems made it for you to do your work, take care of things at home, or get along with other people?: Not difficult at all  Depression Treatment Depression Interventions/Treatment : Medication; Currently on Treatment  Goals Addressed             This Visit's Progress    Exercise 150 minutes per week (moderate activity)   On track            Objective:    Today's Vitals   03/21/24 0943  BP: 129/75  Pulse: 66  Weight: 178 lb (80.7 kg)   Body mass index is 27.06 kg/m.  Hearing/Vision screen No results found. Immunizations and Health Maintenance Health Maintenance  Topic Date Due   COVID-19 Vaccine (4 - 2025-26 season) 12/04/2023   Medicare Annual Wellness (AWV)  03/21/2025   DTaP/Tdap/Td (2 - Td or Tdap) 07/07/2030   Pneumococcal Vaccine: 50+ Years  Completed   Influenza Vaccine  Completed   Zoster Vaccines- Shingrix   Completed   Meningococcal B Vaccine  Aged Out        Assessment/Plan:  This is a routine wellness examination for [place].  Patient Care Team: Dettinger, Fonda LABOR, MD as PCP - General (Family Medicine) Kate Lonni CROME, MD as PCP - Cardiology  (Cardiology) Harvey Margo CROME, MD (Inactive) as Attending Physician (Gastroenterology) Jalene Francis DEL as Physician Assistant (Chiropractic Medicine) Ivin Kocher, MD as Consulting Physician (Dermatology) Duke, Jon Garre, [place] as Physician Assistant (Cardiology)  I have personally reviewed and noted the following in the patients chart:   Medical and social history Use of alcohol, tobacco or illicit drugs  Current medications and supplements including opioid prescriptions. Functional ability and status Nutritional status Physical activity Advanced directives List of other physicians Hospitalizations, surgeries, and ER visits in previous 12 months Vitals Screenings to include cognitive, depression, and falls Referrals and appointments  No orders of the defined types were placed in this encounter.  In addition, I have reviewed and discussed with patient certain preventive protocols, quality metrics, and best practice recommendations. A written personalized care plan for preventive services as well as general preventive health recommendations were provided to patient.   Ozie Ned, CMA   03/21/2024   Return in 1 year (on 03/21/2025).  After Visit Summary: (MyChart) Due to this being a telephonic visit, the after visit summary with patients personalized plan was offered to patient via MyChart   Nurse Notes: pt declined Covid

## 2024-03-21 NOTE — Patient Instructions (Signed)
 Patrick Bell,  Thank you for taking the time for your Medicare Wellness Visit. I appreciate your continued commitment to your health goals. Please review the care plan we discussed, and feel free to reach out if I can assist you further.  Please note that Annual Wellness Visits do not include a physical exam. Some assessments may be limited, especially if the visit was conducted virtually. If needed, we may recommend an in-person follow-up with your provider.  Ongoing Care Seeing your primary care provider every 3 to 6 months helps us  monitor your health and provide consistent, personalized care.   Referrals If a referral was made during today's visit and you haven't received any updates within two weeks, please contact the referred provider directly to check on the status.  Recommended Screenings:  Health Maintenance  Topic Date Due   COVID-19 Vaccine (4 - 2025-26 season) 12/04/2023   Medicare Annual Wellness Visit  01/11/2024   DTaP/Tdap/Td vaccine (2 - Td or Tdap) 07/07/2030   Pneumococcal Vaccine for age over 80  Completed   Flu Shot  Completed   Zoster (Shingles) Vaccine  Completed   Meningitis B Vaccine  Aged Out       03/21/2024    9:46 AM  Advanced Directives  Does Patient Have a Medical Advance Directive? No  Would patient like information on creating a medical advance directive? No - Patient declined    Vision: Annual vision screenings are recommended for early detection of glaucoma, cataracts, and diabetic retinopathy. These exams can also reveal signs of chronic conditions such as diabetes and high blood pressure.  Dental: Annual dental screenings help detect early signs of oral cancer, gum disease, and other conditions linked to overall health, including heart disease and diabetes.  Please see the attached documents for additional preventive care recommendations.

## 2024-03-26 ENCOUNTER — Ambulatory Visit: Attending: Cardiology

## 2024-03-26 ENCOUNTER — Ambulatory Visit: Payer: Self-pay | Admitting: Cardiology

## 2024-03-26 DIAGNOSIS — Z9889 Other specified postprocedural states: Secondary | ICD-10-CM

## 2024-03-26 DIAGNOSIS — I480 Paroxysmal atrial fibrillation: Secondary | ICD-10-CM | POA: Diagnosis not present

## 2024-03-26 DIAGNOSIS — I71019 Dissection of thoracic aorta, unspecified: Secondary | ICD-10-CM

## 2024-03-26 LAB — POCT INR: INR: 3.2 — AB (ref 2.0–3.0)

## 2024-03-26 NOTE — Patient Instructions (Signed)
 Description   INR-3.2; Skip today's dosage of Warfarin, then continue taking Warfarin 1/2 tablet daily except for 1 tablet on Mondays, Wednesdays, and Fridays. Recheck INR in 4 weeks. Coumadin  Clinic [phone]

## 2024-03-26 NOTE — Progress Notes (Signed)
 Description   INR-3.2; Skip today's dosage of Warfarin, then continue taking Warfarin 1/2 tablet daily except for 1 tablet on Mondays, Wednesdays, and Fridays. Recheck INR in 4 weeks. Coumadin  Clinic [phone]

## 2024-04-01 ENCOUNTER — Ambulatory Visit: Admitting: Neurology

## 2024-04-01 ENCOUNTER — Telehealth: Payer: Self-pay | Admitting: Neurology

## 2024-04-01 ENCOUNTER — Encounter: Payer: Self-pay | Admitting: Neurology

## 2024-04-01 VITALS — BP 148/83 | HR 76 | Ht 69.0 in | Wt 180.5 lb

## 2024-04-01 DIAGNOSIS — R269 Unspecified abnormalities of gait and mobility: Secondary | ICD-10-CM | POA: Insufficient documentation

## 2024-04-01 DIAGNOSIS — R413 Other amnesia: Secondary | ICD-10-CM | POA: Insufficient documentation

## 2024-04-01 NOTE — Telephone Encounter (Signed)
 no auth required sent to GI [phone]

## 2024-04-01 NOTE — Progress Notes (Signed)
 "  Chief Complaint  Patient presents with   RM14/DIZZINESS    Pt is here with his Wife. Pt's wife states that pt's dizziness,balance issues,tremor,and memory issues started 4 years ago after pt's Heart Surgery and [place] Fever.       ASSESSMENT AND PLAN  ALEN MATHESON is a 83 y.o. 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   MEDICAL HISTORY:  PJ ZEHNER is a 83 year old male accompanied by his wife, seen in request by primary care doctor   Dettinger, Fonda for evaluation of  worsening memory loss, gait abnormality, initial evaluation April 01, 2024    History is obtained from the patient and review of electronic medical records. I personally reviewed pertinent available imaging films in PACS.   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 He did make good recovery after extensive rehabilitation  Unfortunately he developed [facility] spotted fever in summer 2022,  Then COVID in September 2022  Since then, he was noted to have memory loss, sometimes more confused than the others, tends to repeat questions, used to be very active, happy relaxed, now he is not motivated, spending most of the time sitting down, taking frequent daytime nap, spending long hours in bed at nighttime,  He also have some gait abnormality, chronic low back pain  His mother suffered dementia in her 90s,  His MoCA examination today is 21/30  PHYSICAL EXAM:   Vitals:   04/01/24 0936 04/01/24 0940 04/01/24 0945 04/01/24 0946  BP:  (!) 144/73 134/66 (!) 148/83  Pulse:  71 75 76  SpO2:  99% 99% 99%  Weight: 180 lb 8 oz (81.9 kg)     Height: 5' 9 (1.753 m)        Body mass index is 26.66 kg/m.  PHYSICAL EXAMNIATION:  Gen: NAD, conversant, well nourised, well groomed                     Cardiovascular: Regular rate rhythm, no peripheral edema, warm, nontender. Eyes: Conjunctivae clear without exudates or hemorrhage Neck: Supple, no carotid bruits. Pulmonary: Clear to auscultation bilaterally   NEUROLOGICAL EXAM:  MENTAL STATUS: Speech/cognition: Awake, alert, oriented to history taking and casual conversation  04/01/2024   10:00 AM  Montreal Cognitive Assessment   Visuospatial/ Executive (0/5) 4  Naming (0/3) 3  Attention: Read list of digits (0/2) 2  Attention: Read list of letters (0/1) 1  Attention: Serial 7 subtraction starting at 100 (0/3) 1  Language: Repeat phrase (0/2) 2  Language : Fluency (0/1) 0  Abstraction  (0/2) 2  Delayed Recall (0/5) 0  Orientation (0/6) 6  Total 21    CRANIAL NERVES: CN II: Visual fields are full to confrontation. Pupils are round equal and briskly reactive to light. CN III, IV, VI: extraocular movement are normal. No ptosis. CN V: Facial sensation is intact to light touch CN VII: Face is symmetric with normal eye closure  CN VIII: Hearing is normal to causal conversation. CN IX, X: Phonation is normal. CN XI: Head turning and shoulder shrug are intact  MOTOR: Normal strength, no significant rigidity, bradykinesia, mild left hand action tremor  REFLEXES: Reflexes are 2+ and symmetric at the biceps, triceps, knees, and ankles. Plantar responses are flexor.  SENSORY: Intact to light touch, pinprick and vibratory sensation are intact in fingers and toes.  COORDINATION: There is no trunk or limb dysmetria noted.  GAIT/STANCE: Push-up to get up from seated position, mildly antalgic  REVIEW OF SYSTEMS:  Full 14 system review of systems performed and notable only for as above All other review of systems were negative.   ALLERGIES: Allergies[1]  HOME MEDICATIONS: Current Outpatient Medications  Medication Sig Dispense Refill   acetaminophen  (TYLENOL ) 325 MG tablet Take 1-2 tablets (325-650 mg total) by mouth every 4 (four) hours as needed for mild pain. (Patient taking differently: Take 2,000 mg by mouth daily. Taking 1500 to 2000 mg a day)     albuterol  (VENTOLIN  HFA) 108 (90 Base) MCG/ACT inhaler INHALE 2 PUFFS EVERY 6 HOURS AS NEEDED FOR WHEEZING OR SHORTNESS OF BREATH 8.5 g 2   amLODipine  (NORVASC ) 10 MG tablet Take 1 tablet (10 mg total) by mouth daily. 90 tablet 3   guaiFENesin  (MUCINEX ) 600 MG 12 hr tablet Take 600 mg by mouth every 4 (four) hours as needed for cough or to loosen phlegm.     levothyroxine  (SYNTHROID ) 50 MCG tablet TAKE ONE TABLET BY MOUTH DAILY 90 tablet 2   methocarbamol  (ROBAXIN ) 500 MG tablet Take 1 tablet (500 mg total) by mouth daily  as needed for muscle spasms. 60 tablet 1   metoprolol  succinate (TOPROL -XL) 25 MG 24 hr tablet Take 1 tablet (25 mg total) by mouth daily. TAKE ONE TABLET AT BEDTIME 90 tablet 3   montelukast  (SINGULAIR ) 10 MG tablet Take 1 tablet (10 mg total) by mouth daily. 90 tablet 3   Olopatadine  HCl 0.2 % SOLN Place 1 drop into both eyes daily as needed (allergies).     pantoprazole  (PROTONIX ) 40 MG tablet Take 1 tablet (40 mg total) by mouth 2 (two) times daily before a meal for 14 days. 28 tablet 0   rosuvastatin  (CRESTOR ) 20 MG tablet Take 1 tablet (20 mg total) by mouth daily. 90 tablet 3   warfarin (COUMADIN ) 5 MG tablet TAKE ONE TABLET ONCE DAILY AT 4PM 90 tablet 3   No current facility-administered medications for this visit.    PAST MEDICAL HISTORY: Past Medical History:  Diagnosis Date   Acute thoracic aortic dissection (HCC) 03/11/2020   Type A   Allergy    Asthma    Cataract    Cataract    Diverticulitis    Dyspnea  GERD (gastroesophageal reflux disease)    Heart murmur 12/2019   Hyperlipidemia    Hypertension    Inguinal hernia bilateral, non-recurrent    S/P aortic dissection repair 03/11/2020   supracoronary straight graft repair with resuspension of native aortic valve and open distal anastomosis for intraoperative acute type A aortic dissection    S/P mitral valve repair 03/11/2020   Complex valvuloplasty including artificial Gore-tex neochord placement x 4, suture plication of posterior leaflet and posteromedial commissure and 32 mm Sorin Memo 4D ring annuloplasty   Sleep apnea     PAST SURGICAL HISTORY: Past Surgical History:  Procedure Laterality Date   bilateral inguinal hernia     CARDIAC CATHETERIZATION Bilateral 02/21/2020   CHEST TUBE INSERTION N/A 03/22/2020   Procedure: CHEST TUBE INSERTION OF 28 BLAKE DRAIN.;  Surgeon: Army Dallas NOVAK, MD;  Location: [facility] OR;  Service: Thoracic;  Laterality: N/A;   COLONOSCOPY  06/09/2004   WLM:Dphfnpi colon diverticulosis,  otherwise normal colonoscopy   EYE SURGERY  08/2014   HERNIA REPAIR     MITRAL VALVE REPAIR N/A 03/11/2020   Procedure: MITRAL VALVE REPAIR (MVR) USING MEMO 4D SIZE 32 RING;  Surgeon: Dusty Sudie DEL, MD;  Location: [facility] OR;  Service: Open Heart Surgery;  Laterality: N/A;   PALATE / UVULA BIOPSY / EXCISION     PERICARDIAL WINDOW N/A 03/22/2020   Procedure: SUBXYPHOID POST-OP PERICARDIAL FLUID DRAINAGE WITH CHEST TUBE INSERTION.;  Surgeon: Army Dallas NOVAK, MD;  Location: [facility] OR;  Service: Thoracic;  Laterality: N/A;   REPAIR OF ACUTE ASCENDING THORACIC AORTIC DISSECTION N/A 03/11/2020   Procedure: REPAIR OF ACUTE ASCENDING THORACIC AORTIC DISSECTION USING A HEMASHIELD PLATINUM STRAIGHT GRAFT AND A HEASHIELD PLATINUM 28X10MM SINGLE ARM GRAFT;  Surgeon: Dusty Sudie DEL, MD;  Location: [facility] OR;  Service: Open Heart Surgery;  Laterality: N/A;   RIGHT/LEFT HEART CATH AND CORONARY ANGIOGRAPHY N/A 02/21/2020   Procedure: RIGHT/LEFT HEART CATH AND CORONARY ANGIOGRAPHY;  Surgeon: Burnard Debby LABOR, MD;  Location: [facility] INVASIVE CV LAB;  Service: Cardiovascular;  Laterality: N/A;   TEE WITHOUT CARDIOVERSION N/A 12/19/2019   Procedure: TRANSESOPHAGEAL ECHOCARDIOGRAM (TEE);  Surgeon: Lonni Slain, MD;  Location[facility];  Service: Cardiovascular;  Laterality: N/A;   TEE WITHOUT CARDIOVERSION N/A 03/11/2020   Procedure: TRANSESOPHAGEAL ECHOCARDIOGRAM (TEE);  Surgeon: Dusty Sudie DEL, MD;  Location: [facility] OR;  Service: Open Heart Surgery;  Laterality: N/A;   TEE WITHOUT CARDIOVERSION N/A 03/22/2020   Procedure: TRANSESOPHAGEAL ECHOCARDIOGRAM (TEE);  Surgeon: Army Dallas NOVAK, MD;  Location: [facility] OR;  Service: Thoracic;  Laterality: N/A;   TONSILLECTOMY     VASECTOMY      FAMILY HISTORY: Family History  Problem Relation Age of Onset   Heart disease Mother    Hip fracture Mother 85   Osteoporosis Mother    Dementia Mother 56   Arthritis Father    Heart disease Father    Arthritis Sister     Arthritis Brother    Colon cancer Neg Hx     SOCIAL HISTORY: 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  Tobacco Use   Smoking status: Former    Current packs/day: 0.00    Average packs/day: 4.0 packs/day for 25.0  years (100.0 ttl pk-yrs)    Types: Cigarettes    Start date: 06/22/1957    Quit date: 06/23/1982    Years since quitting: 41.8   Smokeless tobacco: Never   Tobacco comments:    pt was a truck Psychiatric Nurse   Vaping status: Never Used  Substance and Sexual Activity   Alcohol use: Yes    Alcohol/week: 5.0 standard drinks of alcohol    Types: 5 Shots of liquor per week    Comment: brandy a few times a month, beer once a month   Drug use: No   Sexual activity: Yes    Birth control/protection: Surgical  Other Topics Concern   Not on file  Social History Narrative   Not on file   Social Drivers of Health   Tobacco Use: Medium Risk (04/01/2024)   Patient History    Smoking Tobacco Use: Former    Smokeless Tobacco Use: Never    Passive Exposure: Not on file  Financial Resource Strain: Low Risk (01/11/2023)   Overall Financial Resource Strain (CARDIA)    Difficulty of Paying Living Expenses: Not hard at all  Food Insecurity: No Food Insecurity (03/21/2024)   Epic    Worried About Radiation Protection Practitioner of Food in the Last Year: Never true    Ran Out of Food in the Last Year: Never true  Transportation Needs: No Transportation Needs (03/21/2024)   Epic    Lack of Transportation (Medical): No    Lack of Transportation (Non-Medical): No  Physical Activity: Insufficiently Active (03/21/2024)   Exercise Vital Sign    Days of Exercise per Week: 3 days    Minutes of Exercise per Session: 30 min  Stress: No Stress Concern  Present (03/21/2024)   [employer] of Occupational Health - Occupational Stress Questionnaire    Feeling of Stress: Not at all  Social Connections: Moderately Integrated (03/21/2024)   Social Connection and Isolation Panel    Frequency of Communication with Friends and Family: More than three times a week    Frequency of Social Gatherings with Friends and Family: More than three times a week    Attends Religious Services: More than 4 times per year    Active Member of Clubs or Organizations: No    Attends Banker Meetings: Never    Marital Status: Married  Catering Manager Violence: Not At Risk (03/21/2024)   Epic    Fear of Current or Ex-Partner: No    Emotionally Abused: No    Physically Abused: No    Sexually Abused: No  Depression (PHQ2-9): Low Risk (03/21/2024)   Depression (PHQ2-9)    PHQ-2 Score: 0  Alcohol Screen: Low Risk (01/11/2023)   Alcohol Screen    Last Alcohol Screening Score (AUDIT): 1  Housing: Unknown (03/21/2024)   Epic    Unable to Pay for Housing in the Last Year: No    Number of Times Moved in the Last Year: Not on file    Homeless in the Last Year: No  Utilities: Not At Risk (03/21/2024)   Epic    Threatened with loss of utilities: No  Health Literacy: Adequate Health Literacy (03/21/2024)   B1300 Health Literacy    Frequency of need for help with medical instructions: Never      Modena Callander, M.D. Ph.D.  [facility] [address], [address] [place], [place] [address] Ph: [phone] Fax: [phone]  CC:  Dettinger, Fonda LABOR, MD [address] [place],  [place]  [address]  Dettinger, Fonda LABOR, MD       [1] No Known Allergies  "

## 2024-04-02 ENCOUNTER — Ambulatory Visit: Attending: Cardiology

## 2024-04-02 VITALS — BP 119/69 | HR 91

## 2024-04-02 DIAGNOSIS — I10 Essential (primary) hypertension: Secondary | ICD-10-CM

## 2024-04-02 NOTE — Assessment & Plan Note (Addendum)
 Assessment: BP controlled in office: 118/71 and 119/69 mmHg, below goal (<130/80). Home BP averages: 137/78 and 145/76 mmHg, above goal. Tolerates current regimen without side effects. Patient is taking both medications in the evening before bed. Will consider separating doses and see if that improves dizziness Reports occasional fatigue and dizziness; denies SOB, palpitations, chest pain, headache, or swelling. Home BP monitor accurate, but technique needs improvement. Reinforced importance of proper BP technique, regular exercise, and low-salt diet.  Plan:  Start taking amlodipine  10 mg in the morning and metoprolol  25 mg in the evening. Record BP ~ 2 hours after BP meds Patient to record BP and heart rate and bring log to next visit. Follow-up with PharmD in 5 weeks.

## 2024-04-02 NOTE — Progress Notes (Signed)
 Patient ID: Patrick Bell                 DOB: Aug 14, 1940                      MRN: [id number]      HPI: Patrick Bell is a 83 y.o. male referred by Dr. Kate to HTN clinic. PMH is significant for HTN, mitral regurgitation status post mitral valve repair in 2021, atrial fibrillation, OSA, and HLD.  Patient was last seen by Dr. Kate in November 2025 on amlodipine  5 mg and Toprol -XL 50 mg daily. Due to lightheadedness, Toprol -XL was reduced to 25 mg, and patient was instructed to monitor BP twice daily. Initial readings ranged 143-155/81-90. In December, patient reported concern about increased BP. He reported dizziness, denied blurry vision, headache, nausea and vomiting. As a result, provider increased amlodipine  to 10 mg daily and instructed patient to report 2 week BP log which showed: 151/87,147/82,138/81,131/72,137/78,136/84,135/84,125/90,145/68.Pulse 71 to 73. Patient advised to continue twice-daily BP checks and bring log to PharmD visit.  Patient presents in good spirits, accompanied by his wife. Patient states that he takes both amlodipine  10 mg daily and metoprolol  succinate 25 mg at night before bed. He reports occasional fatigue and dizziness but denies shortness of breath or chest pain. Advised patient to check BP when experiencing dizziness. Brought home BP monitor; accuracy confirmed.  Home reading: 118/71, HR 72 Office reading: 119/69, HR 91  Reviewed BP technique: patient had been consuming caffeine prior to checks and was not positioning the cuff correctly. Reinforced proper technique: rest before measurement, avoid caffeine, keep feet flat, and ensure correct cuff placement. He also takes both BP medications at night before bed.    Current HTN meds: amlodipine  10 mg daily at night, metoprolol  succinate 25 mg daily at night BP goal: < 130/80 Labs (03/2024): K 4.3, Scr 1.0, Crcl 58 ml/min   Family History:  Relation Problem Comments  Mother (Deceased at age 41)  Dementia (Age: 37)   Heart disease   Hip fracture (Age: 54)   Osteoporosis     Father (Deceased at age 80) Arthritis   Heart disease     Sister (Alive) Arthritis     Brother Metallurgist) Arthritis     Neg Hx Colon cancer     Social History:  Alcohol: Had one shot of tequila two days ago; typically drinks about once per week. Smoking: none   Diet:  No salt shaker on the table; salt is used in cooking. Drinks mostly water, but not enough  Exercise:  No structured exercise, considering joining fitness center at [facility] BP readings:  Date SBP/DBP Morning  SBP/DBP Afternoon HR  12/20 139/84 146/83 75, 72  12/21 122/67 148/80 74, 74  12/22 143/74 155/81 73, 70  12/23 122/69 141/79 74, 81  12/24 136/76 144/67 74, 76  12/25 130/84    ---- 88  12/26 147/80 143/75 73, 65  12/27 127/81 148/74 75, 75  12/28 146/85 144/70 74, 55  12/30 156/78 134/77 70, 77  Average 137/78 145/76 73      Wt Readings from Last 3 Encounters:  04/01/24 180 lb 8 oz (81.9 kg)  03/21/24 178 lb (80.7 kg)  02/12/24 178 lb 6.4 oz (80.9 kg)   BP Readings from Last 3 Encounters:  04/02/24 119/69  04/01/24 (!) 148/83  03/21/24 129/75   Pulse Readings from Last 3 Encounters:  04/02/24 91  04/01/24 76  03/21/24 66  Renal function: Estimated Creatinine Clearance: 55 mL/min (by C-G formula based on SCr of 1 mg/dL).  Past Medical History:  Diagnosis Date   Acute thoracic aortic dissection (HCC) 03/11/2020   Type A   Allergy    Asthma    Cataract    Cataract    Diverticulitis    Dyspnea    GERD (gastroesophageal reflux disease)    Heart murmur 12/2019   Hyperlipidemia    Hypertension    Inguinal hernia bilateral, non-recurrent    S/P aortic dissection repair 03/11/2020   supracoronary straight graft repair with resuspension of native aortic valve and open distal anastomosis for intraoperative acute type A aortic dissection    S/P mitral valve repair 03/11/2020   Complex  valvuloplasty including artificial Gore-tex neochord placement x 4, suture plication of posterior leaflet and posteromedial commissure and 32 mm Sorin Memo 4D ring annuloplasty   Sleep apnea     Medications Ordered Prior to Encounter[1]  Allergies[2]  Blood pressure 119/69, pulse 91.   Assessment/Plan:  1. Hypertension -  Essential hypertension, benign Assessment: BP controlled in office: 118/71 and 119/69 mmHg, below goal (<130/80). Home BP averages: 137/78 and 145/76 mmHg, above goal. Tolerates current regimen without side effects. Patient is taking both medications in the evening before bed. Will consider separating doses and see if that improves dizziness Reports occasional fatigue and dizziness; denies SOB, palpitations, chest pain, headache, or swelling. Home BP monitor accurate, but technique needs improvement. Reinforced importance of proper BP technique, regular exercise, and low-salt diet.  Plan:  Start taking amlodipine  10 mg in the morning and metoprolol  25 mg in the evening. Record BP ~ 2 hours after BP meds Patient to record BP and heart rate and bring log to next visit. Follow-up with PharmD in 5 weeks.   Thank you  Shepard Keltz E. Xaine Sansom, Pharm.D [place] Elspeth BIRCH. [facility] [address], [place], [place] [address] Phone: [phone]; Fax: [phone]      [1]  Current Outpatient Medications on File Prior to Visit  Medication Sig Dispense Refill   oxyCODONE -acetaminophen  (PERCOCET/ROXICET) 5-325 MG tablet Take 1-2 tablets by mouth every 6 (six) hours as needed for severe pain (pain score 7-10).     pantoprazole  (PROTONIX ) 40 MG tablet Take 1 tablet (40 mg total) by mouth 2 (two) times daily before a meal for 14 days. (Patient taking differently: Take 40 mg by mouth daily.) 28 tablet 0   warfarin (COUMADIN ) 5 MG tablet TAKE ONE TABLET ONCE DAILY AT 4PM (Patient taking differently: 5 mg. TAKE ONE TABLET WEDNESDAY AND  FRIDAYS AND HALF TABLET ALL OTHER DAYS) 90 tablet 3   acetaminophen  (TYLENOL ) 325 MG tablet Take 1-2 tablets (325-650 mg total) by mouth every 4 (four) hours as needed for mild pain. (Patient taking differently: Take 2,000 mg by mouth daily. Taking 1500 to 2000 mg a day)     albuterol  (VENTOLIN  HFA) 108 (90 Base) MCG/ACT inhaler INHALE 2 PUFFS EVERY 6 HOURS AS NEEDED FOR WHEEZING OR SHORTNESS OF BREATH 8.5 g 2   amLODipine  (NORVASC ) 10 MG tablet Take 1 tablet (10 mg total) by mouth daily. 90 tablet 3   guaiFENesin  (MUCINEX ) 600 MG 12 hr tablet Take 600 mg by mouth every 4 (four) hours as needed for cough or to loosen phlegm.     levothyroxine  (SYNTHROID ) 50 MCG tablet TAKE ONE TABLET BY MOUTH DAILY 90 tablet 2   methocarbamol  (ROBAXIN ) 500 MG tablet Take 1  tablet (500 mg total) by mouth daily as needed for muscle spasms. 60 tablet 1   metoprolol  succinate (TOPROL -XL) 25 MG 24 hr tablet Take 1 tablet (25 mg total) by mouth daily. TAKE ONE TABLET AT BEDTIME 90 tablet 3   montelukast  (SINGULAIR ) 10 MG tablet Take 1 tablet (10 mg total) by mouth daily. 90 tablet 3   Olopatadine  HCl 0.2 % SOLN Place 1 drop into both eyes daily as needed (allergies).     rosuvastatin  (CRESTOR ) 20 MG tablet Take 1 tablet (20 mg total) by mouth daily. 90 tablet 3   No current facility-administered medications on file prior to visit.  [2] No Known Allergies

## 2024-04-02 NOTE — Patient Instructions (Addendum)
 Changes made by your pharmacist Braniyah Besse E. Quantarius Genrich, PharmD, CPP at today's visit:    Instructions/Changes  (what do you need to do) Your Notes  (what you did and when you did it)  Begin taking amlodipine  10 mg in the morning and metoprolol  succinate 25 mg at night   2. Check blood pressure twice daily; 2 hours after amlodipine  dose and afternoon blood pressure check as normal   3. PharmD follow up appointment on 05/03/2023 at 11:30 AM     Bring all of your meds, your BP cuff and your record of home blood pressures to your next appointment.   Patrick Bell [name] Patrick Bell, Pharm.D, CPP Clyde Elspeth BIRCH. [facility] [address], [place], [place] [address] Phone: [phone]; Fax: [phone]     HOW TO TAKE YOUR BLOOD PRESSURE AT HOME  Rest 5 minutes before taking your blood pressure.  Dont smoke or drink caffeinated beverages for at least 30 minutes before. Take your blood pressure before (not after) you eat. Sit comfortably with your back supported and both feet on the floor (dont cross your legs). Elevate your arm to heart level on a table or a desk. Use the proper sized cuff. It should fit smoothly and snugly around your bare upper arm. There should be enough room to slip a fingertip under the cuff. The bottom edge of the cuff should be 1 inch above the crease of the elbow. Ideally, take 3 measurements at one sitting and record the average.  Important lifestyle changes to control high blood pressure  Intervention  Effect on the BP  Lose extra pounds and watch your waistline Weight loss is one of the most effective lifestyle changes for controlling blood pressure. If you're overweight or obese, losing even a small amount of weight can help reduce blood pressure. Blood pressure might go down by about 1 millimeter of mercury (mm Hg) with each kilogram (about 2.2 pounds) of weight lost.  Exercise regularly As a general goal, aim for at least 30 minutes of  moderate physical activity every day. Regular physical activity can lower high blood pressure by about 5 to 8 mm Hg.  Eat a healthy diet Eating a diet rich in whole grains, fruits, vegetables, and low-fat dairy products and low in saturated fat and cholesterol. A healthy diet can lower high blood pressure by up to 11 mm Hg.  Reduce salt (sodium) in your diet Even a small reduction of sodium in the diet can improve heart health and reduce high blood pressure by about 5 to 6 mm Hg.  Limit alcohol One drink equals 12 ounces of beer, 5 ounces of wine, or 1.5 ounces of 80-proof liquor.  Limiting alcohol to less than one drink a day for women or two drinks a day for men can help lower blood pressure by about 4 mm Hg.   If you have any questions or concerns please use My Chart to send questions or call the office at [phone]

## 2024-04-03 ENCOUNTER — Ambulatory Visit: Admitting: Surgery

## 2024-04-04 LAB — ATN PROFILE
A -- Beta-amyloid 42/40 Ratio: 0.105
Beta-amyloid 40: 196.37 pg/mL
Beta-amyloid 42: 20.62 pg/mL
N -- NfL, Plasma: 3.71 pg/mL (ref 0.00–9.13)
T -- p-tau181: 2 pg/mL — ABNORMAL HIGH (ref 0.00–0.97)

## 2024-04-04 LAB — CBC WITH DIFFERENTIAL/PLATELET
Basophils Absolute: 0 x10E3/uL (ref 0.0–0.2)
Basos: 1 %
EOS (ABSOLUTE): 0 x10E3/uL (ref 0.0–0.4)
Eos: 0 %
Hematocrit: 47.4 % (ref 37.5–51.0)
Hemoglobin: 15.4 g/dL (ref 13.0–17.7)
Immature Grans (Abs): 0 x10E3/uL (ref 0.0–0.1)
Immature Granulocytes: 0 %
Lymphocytes Absolute: 0.7 x10E3/uL (ref 0.7–3.1)
Lymphs: 15 %
MCH: 29.3 pg (ref 26.6–33.0)
MCHC: 32.5 g/dL (ref 31.5–35.7)
MCV: 90 fL (ref 79–97)
Monocytes Absolute: 0.4 x10E3/uL (ref 0.1–0.9)
Monocytes: 8 %
Neutrophils Absolute: 3.8 x10E3/uL (ref 1.4–7.0)
Neutrophils: 76 %
Platelets: 214 x10E3/uL (ref 150–450)
RBC: 5.26 x10E6/uL (ref 4.14–5.80)
RDW: 12.8 % (ref 11.6–15.4)
WBC: 5 x10E3/uL (ref 3.4–10.8)

## 2024-04-04 LAB — TSH: TSH: 1.17 u[IU]/mL (ref 0.450–4.500)

## 2024-04-04 LAB — COMPREHENSIVE METABOLIC PANEL WITH GFR
ALT: 10 IU/L (ref 0–44)
AST: 22 IU/L (ref 0–40)
Albumin: 4.5 g/dL (ref 3.7–4.7)
Alkaline Phosphatase: 63 IU/L (ref 48–129)
BUN/Creatinine Ratio: 15 (ref 10–24)
BUN: 15 mg/dL (ref 8–27)
Bilirubin Total: 0.7 mg/dL (ref 0.0–1.2)
CO2: 22 mmol/L (ref 20–29)
Calcium: 9.5 mg/dL (ref 8.6–10.2)
Chloride: 104 mmol/L (ref 96–106)
Creatinine, Ser: 1 mg/dL (ref 0.76–1.27)
Globulin, Total: 2 g/dL (ref 1.5–4.5)
Glucose: 84 mg/dL (ref 70–99)
Potassium: 4.3 mmol/L (ref 3.5–5.2)
Sodium: 139 mmol/L (ref 134–144)
Total Protein: 6.5 g/dL (ref 6.0–8.5)
eGFR: 74 mL/min/1.73

## 2024-04-04 LAB — SYPHILIS: RPR W/REFLEX TO RPR TITER AND TREPONEMAL ANTIBODIES, TRADITIONAL SCREENING AND DIAGNOSIS ALGORITHM: RPR Ser Ql: NONREACTIVE

## 2024-04-04 LAB — VITAMIN B12: Vitamin B-12: 358 pg/mL (ref 232–1245)

## 2024-04-04 LAB — LYME DISEASE SEROLOGY W/REFLEX: Lyme Total Antibody EIA: NEGATIVE

## 2024-04-08 ENCOUNTER — Ambulatory Visit: Payer: Self-pay | Admitting: Neurology

## 2024-04-11 ENCOUNTER — Ambulatory Visit (HOSPITAL_COMMUNITY)
Admission: RE | Admit: 2024-04-11 | Discharge: 2024-04-11 | Disposition: A | Discharge status: Home / Self Care | Source: Ambulatory Visit | Attending: Cardiovascular Disease | Admitting: Cardiovascular Disease

## 2024-04-11 DIAGNOSIS — I7101 Dissection of ascending aorta: Secondary | ICD-10-CM | POA: Insufficient documentation

## 2024-04-11 MED ORDER — IOHEXOL 350 MG/ML SOLN
75.0000 mL | Freq: Once | INTRAVENOUS | Status: AC | PRN
  Administered 2024-04-11: 75 mL via INTRAVENOUS

## 2024-04-14 NOTE — Progress Notes (Unsigned)
 " Cardiology Office Note:    Date:  04/16/2024   ID:  Patrick Bell, DOB May 26, 1940, MRN [id number]  PCP:  Dettinger, Fonda LABOR, MD  Cardiologist:  Lonni LITTIE Nanas, MD  Electrophysiologist:  None   Referring MD: Dettinger, Fonda LABOR, MD   Chief Complaint  Patient presents with   Atrial Fibrillation    History of Present Illness:    Patrick Bell is a 84 y.o. male with a hx of  mitral regurgitation status post mitral valve repair in 2021, procedure complicated by type A aortic dissection status post repair, asthma, OSA, hypertension, atrial fibrillation, hyperlipidemia who presents for follow-up.  Previously followed with Dr. Burnard.  LHC prior to mitral valve repair 02/2020 showed normal coronary arteries.  Echocardiogram 10/09/2023 showed EF 55 to 60%, mildly reduced RV function, status post mitral valve repair with mild mitral regurgitation.  Zio patch x 13 days 02/2024 showed 8 episodes of SVT with longest lasting 10 beats, occasional PVCs (2% of beats).  Since last clinic visit, he reports he is doing okay.  Reports BP at home has been 120s to 140.  Denies any chest pain, dyspnea, lower extremity edema, or palpitations.  States that he does note some lightheadedness but denies any syncope.  He was seen by neurology, planning brain MRI.   Past Medical History:  Diagnosis Date   Acute thoracic aortic dissection (HCC) 03/11/2020   Type A   Allergy    Asthma    Cataract    Cataract    Diverticulitis    Dyspnea    GERD (gastroesophageal reflux disease)    Heart murmur 12/2019   Hyperlipidemia    Hypertension    Inguinal hernia bilateral, non-recurrent    S/P aortic dissection repair 03/11/2020   supracoronary straight graft repair with resuspension of native aortic valve and open distal anastomosis for intraoperative acute type A aortic dissection    S/P mitral valve repair 03/11/2020   Complex valvuloplasty including artificial Gore-tex neochord placement x 4, suture  plication of posterior leaflet and posteromedial commissure and 32 mm Sorin Memo 4D ring annuloplasty   Sleep apnea     Past Surgical History:  Procedure Laterality Date   bilateral inguinal hernia     CARDIAC CATHETERIZATION Bilateral 02/21/2020   CHEST TUBE INSERTION N/A 03/22/2020   Procedure: CHEST TUBE INSERTION OF 28 BLAKE DRAIN.;  Surgeon: Army Dallas NOVAK, MD;  Location: [facility] OR;  Service: Thoracic;  Laterality: N/A;   COLONOSCOPY  06/09/2004   WLM:Dphfnpi colon diverticulosis, otherwise normal colonoscopy   EYE SURGERY  08/2014   HERNIA REPAIR     MITRAL VALVE REPAIR N/A 03/11/2020   Procedure: MITRAL VALVE REPAIR (MVR) USING MEMO 4D SIZE 32 RING;  Surgeon: Dusty Sudie DEL, MD;  Location: [facility] OR;  Service: Open Heart Surgery;  Laterality: N/A;   PALATE / UVULA BIOPSY / EXCISION     PERICARDIAL WINDOW N/A 03/22/2020   Procedure: SUBXYPHOID POST-OP PERICARDIAL FLUID DRAINAGE WITH CHEST TUBE INSERTION.;  Surgeon: Army Dallas NOVAK, MD;  Location: [facility] OR;  Service: Thoracic;  Laterality: N/A;   REPAIR OF ACUTE ASCENDING THORACIC AORTIC DISSECTION N/A 03/11/2020   Procedure: REPAIR OF ACUTE ASCENDING THORACIC AORTIC DISSECTION USING A HEMASHIELD PLATINUM STRAIGHT GRAFT AND A HEASHIELD PLATINUM 28X10MM SINGLE ARM GRAFT;  Surgeon: Dusty Sudie DEL, MD;  Location: [facility] OR;  Service: Open Heart Surgery;  Laterality: N/A;   RIGHT/LEFT HEART CATH AND CORONARY ANGIOGRAPHY N/A 02/21/2020   Procedure: RIGHT/LEFT HEART CATH  AND CORONARY ANGIOGRAPHY;  Surgeon: Burnard Debby LABOR, MD;  Location: [facility] INVASIVE CV LAB;  Service: Cardiovascular;  Laterality: N/A;   TEE WITHOUT CARDIOVERSION N/A 12/19/2019   Procedure: TRANSESOPHAGEAL ECHOCARDIOGRAM (TEE);  Surgeon: Lonni Slain, MD;  Location[facility] ENDOSCOPY;  Service: Cardiovascular;  Laterality: N/A;   TEE WITHOUT CARDIOVERSION N/A 03/11/2020   Procedure: TRANSESOPHAGEAL ECHOCARDIOGRAM (TEE);  Surgeon: Dusty Sudie DEL, MD;  Location: [facility] OR;   Service: Open Heart Surgery;  Laterality: N/A;   TEE WITHOUT CARDIOVERSION N/A 03/22/2020   Procedure: TRANSESOPHAGEAL ECHOCARDIOGRAM (TEE);  Surgeon: Army Dallas NOVAK, MD;  Location: [facility] OR;  Service: Thoracic;  Laterality: N/A;   TONSILLECTOMY     VASECTOMY      Current Medications: Current Meds  Medication Sig   acetaminophen  (TYLENOL ) 325 MG tablet Take 1-2 tablets (325-650 mg total) by mouth every 4 (four) hours as needed for mild pain. (Patient taking differently: Take 2,000 mg by mouth daily. Taking 1500 to 2000 mg a day)   albuterol  (VENTOLIN  HFA) 108 (90 Base) MCG/ACT inhaler INHALE 2 PUFFS EVERY 6 HOURS AS NEEDED FOR WHEEZING OR SHORTNESS OF BREATH   amLODipine  (NORVASC ) 10 MG tablet Take 1 tablet (10 mg total) by mouth daily.   guaiFENesin  (MUCINEX ) 600 MG 12 hr tablet Take 600 mg by mouth every 4 (four) hours as needed for cough or to loosen phlegm.   levothyroxine  (SYNTHROID ) 50 MCG tablet TAKE ONE TABLET BY MOUTH DAILY   methocarbamol  (ROBAXIN ) 500 MG tablet Take 1 tablet (500 mg total) by mouth daily as needed for muscle spasms.   metoprolol  succinate (TOPROL -XL) 25 MG 24 hr tablet Take 1 tablet (25 mg total) by mouth daily. TAKE ONE TABLET AT BEDTIME   montelukast  (SINGULAIR ) 10 MG tablet Take 1 tablet (10 mg total) by mouth daily.   Olopatadine  HCl 0.2 % SOLN Place 1 drop into both eyes daily as needed (allergies).   pantoprazole  (PROTONIX ) 40 MG tablet Take 1 tablet (40 mg total) by mouth 2 (two) times daily before a meal for 14 days. (Patient taking differently: Take 40 mg by mouth daily.)   rosuvastatin  (CRESTOR ) 20 MG tablet Take 1 tablet (20 mg total) by mouth daily.   warfarin (COUMADIN ) 5 MG tablet TAKE ONE TABLET ONCE DAILY AT 4PM (Patient taking differently: 5 mg. TAKE ONE TABLET WEDNESDAY AND FRIDAYS AND HALF TABLET ALL OTHER DAYS)     Allergies:   Patient has no known allergies.   Social History   Socioeconomic History   Marital status: Married    Spouse  name: Asberry   Number of children: 2   Years of education: 14   Highest education level: Some college, no degree  Occupational History   Occupation: retired    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  Tobacco Use   Smoking status: Former    Current packs/day: 0.00    Average packs/day: 4.0 packs/day for 25.0 years (100.0 ttl pk-yrs)    Types: Cigarettes    Start date: 06/22/1957    Quit date: 06/23/1982    Years since quitting: 41.8   Smokeless tobacco: Never   Tobacco comments:    pt was a truck Psychiatric Nurse   Vaping status: Never Used  Substance and Sexual Activity   Alcohol use: Yes    Alcohol/week: 5.0 standard drinks of alcohol    Types: 5 Shots of liquor per week    Comment: brandy a few times a month, beer once a month  Drug use: No   Sexual activity: Yes    Birth control/protection: Surgical  Other Topics Concern   Not on file  Social History Narrative   Not on file   Social Drivers of Health   Tobacco Use: Medium Risk (04/02/2024)   Patient History    Smoking Tobacco Use: Former    Smokeless Tobacco Use: Never    Passive Exposure: Not on file  Financial Resource Strain: Low Risk (01/11/2023)   Overall Financial Resource Strain (CARDIA)    Difficulty of Paying Living Expenses: Not hard at all  Food Insecurity: No Food Insecurity (03/21/2024)   Epic    Worried About Radiation Protection Practitioner of Food in the Last Year: Never true    Ran Out of Food in the Last Year: Never true  Transportation Needs: No Transportation Needs (03/21/2024)   Epic    Lack of Transportation (Medical): No    Lack of Transportation (Non-Medical): No  Physical Activity: Insufficiently Active (03/21/2024)   Exercise Vital Sign    Days of Exercise per Week: 3 days    Minutes of Exercise per Session: 30 min  Stress: No Stress Concern Present (03/21/2024)   [employer] of Occupational Health - Occupational Stress Questionnaire    Feeling of Stress: Not at all  Social  Connections: Moderately Integrated (03/21/2024)   Social Connection and Isolation Panel    Frequency of Communication with Friends and Family: More than three times a week    Frequency of Social Gatherings with Friends and Family: More than three times a week    Attends Religious Services: More than 4 times per year    Active Member of Clubs or Organizations: No    Attends Banker Meetings: Never    Marital Status: Married  Depression (PHQ2-9): Low Risk (03/21/2024)   Depression (PHQ2-9)    PHQ-2 Score: 0  Alcohol Screen: Low Risk (01/11/2023)   Alcohol Screen    Last Alcohol Screening Score (AUDIT): 1  Housing: Unknown (03/21/2024)   Epic    Unable to Pay for Housing in the Last Year: No    Number of Times Moved in the Last Year: Not on file    Homeless in the Last Year: No  Utilities: Not At Risk (03/21/2024)   Epic    Threatened with loss of utilities: No  Health Literacy: Adequate Health Literacy (03/21/2024)   B1300 Health Literacy    Frequency of need for help with medical instructions: Never     Family History: The patient's family history includes Arthritis in his brother, father, and sister; Dementia (age of onset: 48) in his mother; Heart disease in his father and mother; Hip fracture (age of onset: 64) in his mother; Osteoporosis in his mother. There is no history of Colon cancer.  ROS:   Please see the history of present illness.     All other systems reviewed and are negative.  EKGs/Labs/Other Studies Reviewed:    The following studies were reviewed today:   EKG:   02/12/2024: Sinus rhythm, first-degree AV block, Q waves in leads III, aVF, nonspecific T wave flattening  Recent Labs: 04/01/2024: ALT 10; BUN 15; Creatinine, Ser 1.00; Hemoglobin 15.4; Platelets 214; Potassium 4.3; Sodium 139; TSH 1.170  Recent Lipid Panel    Component Value Date/Time   CHOL 144 07/24/2023 1053   CHOL 168 06/18/2012 1031   TRIG 69 07/24/2023 1053   TRIG 101  01/14/2013 1218   TRIG 102 06/18/2012 1031   HDL 69 07/24/2023 1053  HDL 65 01/14/2013 1218   HDL 62 06/18/2012 1031   CHOLHDL 2.1 07/24/2023 1053   LDLCALC 61 07/24/2023 1053   LDLCALC 99 01/14/2013 1218   LDLCALC 86 06/18/2012 1031    Physical Exam:    VS:  BP 120/60 (BP Location: Right Arm, Patient Position: Sitting, Cuff Size: Normal)   Pulse 71   Ht 5' 10 (1.778 m)   Wt 181 lb (82.1 kg)   SpO2 94%   BMI 25.97 kg/m     Wt Readings from Last 3 Encounters:  04/16/24 181 lb (82.1 kg)  04/01/24 180 lb 8 oz (81.9 kg)  03/21/24 178 lb (80.7 kg)     GEN:  Well nourished, well developed in no acute distress HEENT: Normal NECK: No JVD; No carotid bruits LYMPHATICS: No lymphadenopathy CARDIAC: RRR, no murmurs, rubs, gallops RESPIRATORY:  Clear to auscultation without rales, wheezing or rhonchi  ABDOMEN: Soft, non-tender, non-distended MUSCULOSKELETAL:  No edema; No deformity  SKIN: Warm and dry NEUROLOGIC:  Alert and oriented x 3 PSYCHIATRIC:  Normal affect   ASSESSMENT:    1. Paroxysmal atrial fibrillation (HCC)   2. S/P mitral valve repair   3. Chronic thoracic aortic dissection (HCC)   4. Lightheadedness   5. OSA (obstructive sleep apnea)   6. Essential hypertension   7. Hyperlipidemia, unspecified hyperlipidemia type     PLAN:    Paroxysmal atrial fibrillation: Occurred postoperatively after mitral valve repair.  CHA2DS2-VASc 3 (hypertension, age x 2).  Zio patch x 2 weeks 02/2024 did not show any Afib - On Coumadin , follows in Coumadin  clinic.  Discussed switching to DOAC but he wishes to remain on Coumadin  - Continue Toprol -XL 25 mg daily  Mitral regurgitation status post mitral valve repair in 2021: Echocardiogram 10/09/2023 showed EF 55 to 60%, mildly reduced RV function, status post mitral valve repair with mild mitral regurgitation.  Type A aortic dissection: Mitral valve repair was complicated by aortic dissection status post repair.  Has residual  chronic distal aortic dissection, follows with Dr. Lucas  Lightheadedness: Reports chronic lightheadedness.  BP appears controlled.  Carotid duplex 02/2024 showed less than 50% stenosis bilaterally.  Echocardiogram 10/09/2023 showed EF 55 to 60%, mildly reduced RV function, status post mitral valve repair with mild mitral regurgitation.  No significant arrhythmias on Zio patch x 13 days 02/2024. - Seen by neurology, planning brain MRI  Hypertension: On amlodipine  10 mg daily and metoprolol  25 mg nightly.  Appears controlled on  Hyperlipidemia: On rosuvastatin  20 mg daily.  LDL 61 on 07/24/2023  OSA: Reports has been off CPAP for 2 years.  Will refer to sleep medicine   RTC in 6 months  Medication Adjustments/Labs and Tests Ordered: Current medicines are reviewed at length with the patient today.  Concerns regarding medicines are outlined above.  Orders Placed This Encounter  Procedures   Ambulatory referral to Pulmonology   No orders of the defined types were placed in this encounter.   Patient Instructions  Medication Instructions:  Your physician recommends that you continue on your current medications as directed. Please refer to the Current Medication list given to you today.   *If you need a refill on your cardiac medications before your next appointment, please call your pharmacy*  Lab Work: NONE  Testing/Procedures: NONE  Follow-Up: At [facility], you and your health needs are our priority.  As part of our continuing mission to provide you with exceptional heart care, our providers are all part of one team.  This team includes your primary Cardiologist (physician) and Advanced Practice Providers or APPs (Physician Assistants and Nurse Practitioners) who all work together to provide you with the care you need, when you need it.  Your next appointment:   6 month(s)  Provider:   WITH DR KATE OR APP   We recommend signing up for the patient portal called  MyChart.  Sign up information is provided on this After Visit Summary.  MyChart is used to connect with patients for Virtual Visits (Telemedicine).  Patients are able to view lab/test results, encounter notes, upcoming appointments, etc.  Non-urgent messages can be sent to your provider as well.   To learn more about what you can do with MyChart, go to [email or web address].   Other Instructions You have been referred to PULMONARY  IF YOU DO NOT HEAR FROM THEM YOU CAN CALL THEM DIRECTLY AT THE NUMBER HIGHLIGHTED             Signed, Lonni LITTIE Kate, MD  04/16/2024 12:35 PM    [facility] "

## 2024-04-16 ENCOUNTER — Encounter: Payer: Self-pay | Admitting: *Deleted

## 2024-04-16 ENCOUNTER — Ambulatory Visit: Attending: Cardiology | Admitting: Cardiology

## 2024-04-16 ENCOUNTER — Ambulatory Visit: Admitting: *Deleted

## 2024-04-16 VITALS — BP 120/60 | HR 71 | Ht 70.0 in | Wt 181.0 lb

## 2024-04-16 DIAGNOSIS — I71019 Dissection of thoracic aorta, unspecified: Secondary | ICD-10-CM | POA: Diagnosis not present

## 2024-04-16 DIAGNOSIS — Z9889 Other specified postprocedural states: Secondary | ICD-10-CM | POA: Diagnosis not present

## 2024-04-16 DIAGNOSIS — R42 Dizziness and giddiness: Secondary | ICD-10-CM

## 2024-04-16 DIAGNOSIS — I10 Essential (primary) hypertension: Secondary | ICD-10-CM

## 2024-04-16 DIAGNOSIS — I480 Paroxysmal atrial fibrillation: Secondary | ICD-10-CM

## 2024-04-16 DIAGNOSIS — E785 Hyperlipidemia, unspecified: Secondary | ICD-10-CM | POA: Diagnosis not present

## 2024-04-16 DIAGNOSIS — G4733 Obstructive sleep apnea (adult) (pediatric): Secondary | ICD-10-CM

## 2024-04-16 LAB — POCT INR: INR: 2.8 (ref 2.0–3.0)

## 2024-04-16 NOTE — Patient Instructions (Signed)
 Medication Instructions:  Your physician recommends that you continue on your current medications as directed. Please refer to the Current Medication list given to you today.   *If you need a refill on your cardiac medications before your next appointment, please call your pharmacy*  Lab Work: NONE  Testing/Procedures: NONE  Follow-Up: At [facility], you and your health needs are our priority.  As part of our continuing mission to provide you with exceptional heart care, our providers are all part of one team.  This team includes your primary Cardiologist (physician) and Advanced Practice Providers or APPs (Physician Assistants and Nurse Practitioners) who all work together to provide you with the care you need, when you need it.  Your next appointment:   6 month(s)  Provider:   WITH DR KATE OR APP   We recommend signing up for the patient portal called MyChart.  Sign up information is provided on this After Visit Summary.  MyChart is used to connect with patients for Virtual Visits (Telemedicine).  Patients are able to view lab/test results, encounter notes, upcoming appointments, etc.  Non-urgent messages can be sent to your provider as well.   To learn more about what you can do with MyChart, go to [email or web address].   Other Instructions You have been referred to PULMONARY  IF YOU DO NOT HEAR FROM THEM YOU CAN CALL THEM DIRECTLY AT THE NUMBER HIGHLIGHTED

## 2024-04-16 NOTE — Patient Instructions (Signed)
 Description   INR-2.8; Continue taking Warfarin 1/2 tablet daily except for 1 tablet on Mondays, Wednesdays, and Fridays. Recheck INR in 5 weeks. Coumadin  Clinic [phone]

## 2024-04-16 NOTE — Progress Notes (Signed)
 INR goal 2-3  Description   INR-2.8; Continue taking Warfarin 1/2 tablet daily except for 1 tablet on Mondays, Wednesdays, and Fridays. Recheck INR in 5 weeks. Coumadin  Clinic [phone]

## 2024-04-18 ENCOUNTER — Ambulatory Visit
Admission: RE | Admit: 2024-04-18 | Discharge: 2024-04-18 | Disposition: A | Source: Ambulatory Visit | Attending: Neurology | Admitting: Neurology

## 2024-04-18 DIAGNOSIS — R413 Other amnesia: Secondary | ICD-10-CM | POA: Diagnosis not present

## 2024-04-19 NOTE — Therapy (Signed)
 " OUTPATIENT PHYSICAL THERAPY NEURO EVALUATION   Patient Name: Patrick Bell MRN: [id number] DOB:18-Nov-1940, 84 y.o., male Today's Date: 04/22/2024   PCP:    Dettinger, Fonda LABOR, MD       REFERRING PROVIDER: Onita Duos, MD  END OF SESSION:  PT End of Session - 04/22/24 1724     Visit Number 1    Number of Visits 17    Date for Recertification  06/17/24    Authorization Type [employer]    Authorization Time Period auth submitted    PT Start Time 1450    PT Stop Time 1528    PT Time Calculation (min) 38 min    Equipment Utilized During Treatment Gait belt    Activity Tolerance Patient tolerated treatment well    Behavior During Therapy WFL for tasks assessed/performed          Past Medical History:  Diagnosis Date   Acute thoracic aortic dissection (HCC) 03/11/2020   Type A   Allergy    Asthma    Cataract    Cataract    Diverticulitis    Dyspnea    GERD (gastroesophageal reflux disease)    Heart murmur 12/2019   Hyperlipidemia    Hypertension    Inguinal hernia bilateral, non-recurrent    S/P aortic dissection repair 03/11/2020   supracoronary straight graft repair with resuspension of native aortic valve and open distal anastomosis for intraoperative acute type A aortic dissection    S/P mitral valve repair 03/11/2020   Complex valvuloplasty including artificial Gore-tex neochord placement x 4, suture plication of posterior leaflet and posteromedial commissure and 32 mm Sorin Memo 4D ring annuloplasty   Sleep apnea    Past Surgical History:  Procedure Laterality Date   bilateral inguinal hernia     CARDIAC CATHETERIZATION Bilateral 02/21/2020   CHEST TUBE INSERTION N/A 03/22/2020   Procedure: CHEST TUBE INSERTION OF 28 BLAKE DRAIN.;  Surgeon: Army Dallas NOVAK, MD;  Location: [facility] OR;  Service: Thoracic;  Laterality: N/A;   COLONOSCOPY  06/09/2004   WLM:Dphfnpi colon diverticulosis, otherwise normal colonoscopy   EYE SURGERY  08/2014   HERNIA REPAIR      MITRAL VALVE REPAIR N/A 03/11/2020   Procedure: MITRAL VALVE REPAIR (MVR) USING MEMO 4D SIZE 32 RING;  Surgeon: Dusty Sudie DEL, MD;  Location: [facility] OR;  Service: Open Heart Surgery;  Laterality: N/A;   PALATE / UVULA BIOPSY / EXCISION     PERICARDIAL WINDOW N/A 03/22/2020   Procedure: SUBXYPHOID POST-OP PERICARDIAL FLUID DRAINAGE WITH CHEST TUBE INSERTION.;  Surgeon: Army Dallas NOVAK, MD;  Location: [facility] OR;  Service: Thoracic;  Laterality: N/A;   REPAIR OF ACUTE ASCENDING THORACIC AORTIC DISSECTION N/A 03/11/2020   Procedure: REPAIR OF ACUTE ASCENDING THORACIC AORTIC DISSECTION USING A HEMASHIELD PLATINUM STRAIGHT GRAFT AND A HEASHIELD PLATINUM 28X10MM SINGLE ARM GRAFT;  Surgeon: Dusty Sudie DEL, MD;  Location: [facility] OR;  Service: Open Heart Surgery;  Laterality: N/A;   RIGHT/LEFT HEART CATH AND CORONARY ANGIOGRAPHY N/A 02/21/2020   Procedure: RIGHT/LEFT HEART CATH AND CORONARY ANGIOGRAPHY;  Surgeon: Burnard Debby LABOR, MD;  Location: [facility] IN[facility]ASIVE [facility];  Service: Cardiovascular;  Laterality: N/A;   TEE WITHOUT CARDIOVERSION N/A 12/19/2019   Procedure: TRANSESOPHAGEAL ECHOCARDIOGRAM (TEE);  Surgeon: Lonni Slain, MD;  Location: [facility] ENDOSCOPY;  Service: Cardiovascular;  Laterality: N/A;   TEE WITHOUT CARDIOVERSION N/A 03/11/2020   Procedure: TRANSESOPHAGEAL ECHOCARDIOGRAM (TEE);  Surgeon: Dusty Sudie DEL, MD;  Location: [facility] OR;  Service: Open Heart  Surgery;  Laterality: N/A;   TEE WITHOUT CARDIOVERSION N/A 03/22/2020   Procedure: TRANSESOPHAGEAL ECHOCARDIOGRAM (TEE);  Surgeon: Army Dallas NOVAK, MD;  Location: [facility] OR;  Service: Thoracic;  Laterality: N/A;   TONSILLECTOMY     VASECTOMY     Patient Active Problem List   Diagnosis Date Noted   Memory loss 04/01/2024   Gait abnormality 04/01/2024   Allergic rhinitis 11/08/2021   Presbycusis of both ears 10/21/2021   Long term (current) use of anticoagulants 04/27/2020   Atrial fibrillation (HCC) 04/27/2020   Anemia 04/21/2020   Peripheral  edema    Hypothyroidism    S/P mitral valve repair 03/11/2020   S/P aortic dissection repair 03/11/2020   BPH (benign prostatic hyperplasia) 07/29/2014   Low serum vitamin D  03/14/2014   Hyperlipidemia    GERD (gastroesophageal reflux disease) 08/03/2012   Essential hypertension, benign 08/19/2008   Diverticulosis of colon 08/18/2008   Backache 08/18/2008   OSA (obstructive sleep apnea) 08/18/2008   PREMATURE VENTRICULAR CONTRACTIONS 07/23/2008   Asthma-COPD overlap syndrome 07/23/2008    ONSET DATE: years   REFERRING DIAG: R41.3 (ICD-10-CM) - Memory loss R26.9 (ICD-10-CM) - Gait abnormality  THERAPY DIAG:  Other abnormalities of gait and mobility  Unsteadiness on feet  Abnormal posture  Muscle weakness (generalized)  Rationale for Evaluation and Treatment: Rehabilitation  SUBJECTIVE:                                                                                                                                                                                             SUBJECTIVE STATEMENT: Patient with questions on what plan/rationale for therapy is without knowing the results of his brain MRI. Patient reports that he does not know why his neurologist sent him here. Reports that he has some shaking in his hands, changes in speech, changes in stability with walking (cites painful ingrown toenail), and I'm just weak. Falls have occurred forward and to the R and has difficulty getting up from the floor. Using the cane outside. Patient also reports longstanding dizziness.    Pt accompanied by: self  PERTINENT HISTORY: Asthma, HLD, HTN. S/p aortic dissection repair   PAIN:  Are you having pain? Reports chronic back pain since his 20's  PRECAUTIONS: Fall  RED FLAGS: None   WEIGHT BEARING RESTRICTIONS: No  FALLS: Has patient fallen in last 6 months? Yes. Number of falls 3  LIVING ENVIRONMENT: Lives with: lives with their spouse Lives in: House/apartment Stairs: 3  steps to enter with railing B; 13 steps to basement, 13 steps outside from basement  Has following equipment at home: Single point cane, Walker - 2  wheeled, and Ramped entry  PLOF: Independent and Leisure: none currently  PATIENT GOALS: patient states I'm not sure why I'm here  OBJECTIVE:  Note: Objective measures were completed at Evaluation unless otherwise noted.  DIAGNOSTIC FINDINGS: Brain MRI 04/18/24 but results not yet posted   COGNITION: Overall cognitive status: Within functional limits for tasks assessed   SENSATION: Pt denies N/T in UEs/LEs   COORDINATION: Alternating pronation/supination: slightly slowed B Alternating toe tap: WNL Finger to nose: WNL but tremor on L   MUSCLE TONE: WNL B LEs  POSTURE: rounded shoulders, forward head, and flexed trunk     LOWER EXTREMITY ROM:     Active  Right Eval Left Eval  Hip flexion    Hip extension    Hip abduction    Hip adduction    Hip internal rotation    Hip external rotation    Knee flexion    Knee extension Lacking full extension d/t HS tightness Lacking full extension d/t HS tightness  Ankle dorsiflexion 6 4  Ankle plantarflexion    Ankle inversion    Ankle eversion     (Blank rows = not tested)  LOWER EXTREMITY MMT:    MMT (in sitting) Right Eval Left Eval  Hip flexion 4 4+  Hip extension    Hip abduction 4- 4-  Hip adduction    Hip internal rotation    Hip external rotation    Knee flexion 4+ 4+  Knee extension 4 4  Ankle dorsiflexion 5 5  Ankle plantarflexion    Ankle inversion    Ankle eversion    (Blank rows = not tested)  GAIT: Findings: Assistive device utilized:None, Level of assistance: SBA, and Comments: No arm swing, anterior wt shift onto toes with limited foot clearance and step length   FUNCTIONAL TESTS:  5 times sit to stand: 19.23 sec without armrests and difficulty to complete on last rep 10 meter walk test: 14.22 sec without AD (2.3 ft/sec)    [facility] PT Assessment -  04/22/24 0001       Standardized Balance Assessment   Standardized Balance Assessment Dynamic Gait Index      Dynamic Gait Index   Level Surface Mild Impairment    Change in Gait Speed Moderate Impairment   c/o dizziness during slow gait   Gait with Horizontal Head Turns Moderate Impairment    Gait with Vertical Head Turns Moderate Impairment    Gait and Pivot Turn Normal    Step Over Obstacle Mild Impairment    Step Around Obstacles Moderate Impairment    Steps Normal    Total Score 14                                                                                                                                      TREATMENT DATE: 04/22/24    PATIENT EDUCATION: Education details: prognosis, POC, edu on exam findings and relevance  to pt's functional impairments ; answered pt's questions on stationary bike, answered questions on rationale for therapy Person educated: Patient Education method: Explanation Education comprehension: verbalized understanding  HOME EXERCISE PROGRAM: Not yet initiated   GOALS: Goals reviewed with patient? Yes  SHORT TERM GOALS: Target date: 05/20/2024  Patient to be independent with initial HEP. Baseline: HEP initiated Goal status: INITIAL    LONG TERM GOALS: Target date: 06/17/2024  Patient to be independent with advanced HEP. Baseline: Not yet initiated  Goal status: INITIAL  Patient to demonstrate B LE strength >/=4+/5.  Baseline: See above Goal status: INITIAL  Patient to score at least 20/24 on DGI in order to decrease risk of falls.  Baseline: 14 Goal status: INITIAL  Patient to demonstrate gait speed of at least 2.62 ft/sec in order to improve access to community.  Baseline: 2.3 f/tsec Goal status: INITIAL  Patient to demonstrate 5xSTS test in <15 sec in order to decrease risk of falls.  Baseline: 5 times sit to stand: 19.23 sec without armrests and difficulty to complete on last rep Goal status:  INITIAL   ASSESSMENT:  CLINICAL IMPRESSION:  Patient is an 84 y/o M presenting to OPPT with c/o changes in gait and balance and having falls. Patient today presenting with Slowed UE coordination testing, rounded posture, HS/gastroc tightness, hip weakness, gait deviations, and impaired balance. Would benefit from skilled PT services 1-2 x/week for 8 weeks to address aforementioned impairments in order to optimize level of function.    OBJECTIVE IMPAIRMENTS: Abnormal gait, decreased balance, decreased mobility, decreased ROM, decreased strength, dizziness, impaired flexibility, and postural dysfunction.   ACTIVITY LIMITATIONS: carrying, lifting, bending, standing, squatting, sleeping, stairs, transfers, bathing, toileting, dressing, reach over head, locomotion level, and caring for others  PARTICIPATION LIMITATIONS: meal prep, cleaning, laundry, shopping, community activity, and church  PERSONAL FACTORS: Age, Past/current experiences, Time since onset of injury/illness/exacerbation, and 3+ comorbidities: Asthma, HLD, HTN. S/p aortic dissection repair  are also affecting patient's functional outcome.   REHAB POTENTIAL: Good  CLINICAL DECISION MAKING: Evolving/moderate complexity  EVALUATION COMPLEXITY: Moderate  PLAN:  PT FREQUENCY: 1-2x/week  PT DURATION: 8 weeks  PLANNED INTERVENTIONS: 97164- PT Re-evaluation, 97110-Therapeutic exercises, 97530- Therapeutic activity, 97112- Neuromuscular re-education, 97535- Self Care, 02859- Manual therapy, [phone]- Gait training, [phone]- Canalith repositioning, [phone] (1-2 muscles), 20561 (3+ muscles)- Dry Needling, Patient/Family education, Balance training, Stair training, Taping, Joint mobilization, Spinal mobilization, Cryotherapy, and Moist heat  PLAN FOR NEXT SESSION: initiate HEP for functional strengthening, arm swing for gait; inquire more about dizziness, LE stretching, posture    Louana Terrilyn Christians, [place], DPT 04/22/24 5:34 PM  [facility] at [facility] [address] [place], [place] [address] Phone # [phone] Fax # [phone]        "

## 2024-04-22 ENCOUNTER — Other Ambulatory Visit: Payer: Self-pay

## 2024-04-22 ENCOUNTER — Ambulatory Visit: Attending: Neurology | Admitting: Physical Therapy

## 2024-04-22 ENCOUNTER — Encounter: Payer: Self-pay | Admitting: Physical Therapy

## 2024-04-22 DIAGNOSIS — R42 Dizziness and giddiness: Secondary | ICD-10-CM | POA: Diagnosis present

## 2024-04-22 DIAGNOSIS — M6281 Muscle weakness (generalized): Secondary | ICD-10-CM | POA: Diagnosis present

## 2024-04-22 DIAGNOSIS — R269 Unspecified abnormalities of gait and mobility: Secondary | ICD-10-CM | POA: Insufficient documentation

## 2024-04-22 DIAGNOSIS — R2681 Unsteadiness on feet: Secondary | ICD-10-CM | POA: Insufficient documentation

## 2024-04-22 DIAGNOSIS — R2689 Other abnormalities of gait and mobility: Secondary | ICD-10-CM | POA: Diagnosis present

## 2024-04-22 DIAGNOSIS — R413 Other amnesia: Secondary | ICD-10-CM | POA: Diagnosis not present

## 2024-04-22 DIAGNOSIS — R293 Abnormal posture: Secondary | ICD-10-CM | POA: Insufficient documentation

## 2024-04-23 ENCOUNTER — Other Ambulatory Visit: Payer: Self-pay | Admitting: Family Medicine

## 2024-04-23 NOTE — Telephone Encounter (Signed)
 Dettinger NTBS before Last OV & TSH 07/24/23 RF sent to pharmacy

## 2024-04-24 ENCOUNTER — Ambulatory Visit: Attending: Surgery | Admitting: Surgery

## 2024-04-24 ENCOUNTER — Encounter: Payer: Self-pay | Admitting: Surgery

## 2024-04-24 VITALS — BP 118/62 | HR 69 | Resp 18 | Ht 70.0 in | Wt 179.0 lb

## 2024-04-24 DIAGNOSIS — I7101 Dissection of ascending aorta: Secondary | ICD-10-CM

## 2024-04-24 NOTE — Progress Notes (Unsigned)
" ° °  [address] [place] [address]             [phone]     HPI: ***  Current Outpatient Medications  Medication Sig Dispense Refill   acetaminophen  (TYLENOL ) 325 MG tablet Take 1-2 tablets (325-650 mg total) by mouth every 4 (four) hours as needed for mild pain. (Patient taking differently: Take 2,000 mg by mouth daily. Taking 1500 to 2000 mg a day)     albuterol  (VENTOLIN  HFA) 108 (90 Base) MCG/ACT inhaler INHALE 2 PUFFS EVERY 6 HOURS AS NEEDED FOR WHEEZING OR SHORTNESS OF BREATH 8.5 g 2   amLODipine  (NORVASC ) 10 MG tablet Take 1 tablet (10 mg total) by mouth daily. 90 tablet 3   guaiFENesin  (MUCINEX ) 600 MG 12 hr tablet Take 600 mg by mouth every 4 (four) hours as needed for cough or to loosen phlegm.     levothyroxine  (SYNTHROID ) 50 MCG tablet TAKE ONE TABLET BY MOUTH DAILY 90 tablet 0   methocarbamol  (ROBAXIN ) 500 MG tablet Take 1 tablet (500 mg total) by mouth daily as needed for muscle spasms. 60 tablet 1   metoprolol  succinate (TOPROL -XL) 25 MG 24 hr tablet Take 1 tablet (25 mg total) by mouth daily. TAKE ONE TABLET AT BEDTIME 90 tablet 3   montelukast  (SINGULAIR ) 10 MG tablet Take 1 tablet (10 mg total) by mouth daily. 90 tablet 3   Olopatadine  HCl 0.2 % SOLN Place 1 drop into both eyes daily as needed (allergies).     rosuvastatin  (CRESTOR ) 20 MG tablet Take 1 tablet (20 mg total) by mouth daily. 90 tablet 3   warfarin (COUMADIN ) 5 MG tablet TAKE ONE TABLET ONCE DAILY AT 4PM (Patient taking differently: 5 mg. TAKE ONE TABLET WEDNESDAY AND FRIDAYS AND HALF TABLET ALL OTHER DAYS) 90 tablet 3   oxyCODONE -acetaminophen  (PERCOCET/ROXICET) 5-325 MG tablet Take 1-2 tablets by mouth every 6 (six) hours as needed for severe pain (pain score 7-10). (Patient not taking: Reported on 04/24/2024)     pantoprazole  (PROTONIX ) 40 MG tablet Take 1 tablet (40 mg total) by mouth 2 (two) times daily before a meal for 14 days. (Patient not taking: Reported on 04/24/2024) 28  tablet 0   No current facility-administered medications for this visit.     Physical Exam: ***  Diagnostic Tests: ***  Impression: ***  Plan: ***   Dorise MARLA Fellers, MD [facility] [phone]       "

## 2024-04-26 ENCOUNTER — Ambulatory Visit: Admitting: Cardiology

## 2024-05-02 ENCOUNTER — Ambulatory Visit

## 2024-05-03 NOTE — Therapy (Incomplete)
 " OUTPATIENT PHYSICAL THERAPY NEURO TREATMENT   Patient Name: Patrick Bell MRN: [id number] DOB:Aug 12, 1940, 84 y.o., male Today's Date: 05/03/2024   PCP:    Dettinger, Fonda LABOR, MD       REFERRING PROVIDER: Onita Duos, MD  END OF SESSION:    Past Medical History:  Diagnosis Date   Acute thoracic aortic dissection (HCC) 03/11/2020   Type A   Allergy    Asthma    Cataract    Cataract    Diverticulitis    Dyspnea    GERD (gastroesophageal reflux disease)    Heart murmur 12/2019   Hyperlipidemia    Hypertension    Inguinal hernia bilateral, non-recurrent    S/P aortic dissection repair 03/11/2020   supracoronary straight graft repair with resuspension of native aortic valve and open distal anastomosis for intraoperative acute type A aortic dissection    S/P mitral valve repair 03/11/2020   Complex valvuloplasty including artificial Gore-tex neochord placement x 4, suture plication of posterior leaflet and posteromedial commissure and 32 mm Sorin Memo 4D ring annuloplasty   Sleep apnea    Past Surgical History:  Procedure Laterality Date   bilateral inguinal hernia     CARDIAC CATHETERIZATION Bilateral 02/21/2020   CHEST TUBE INSERTION N/A 03/22/2020   Procedure: CHEST TUBE INSERTION OF 28 BLAKE DRAIN.;  Surgeon: Army Dallas NOVAK, MD;  Location: [facility] OR;  Service: Thoracic;  Laterality: N/A;   COLONOSCOPY  06/09/2004   WLM:Dphfnpi colon diverticulosis, otherwise normal colonoscopy   EYE SURGERY  08/2014   HERNIA REPAIR     MITRAL VALVE REPAIR N/A 03/11/2020   Procedure: MITRAL VALVE REPAIR (MVR) USING MEMO 4D SIZE 32 RING;  Surgeon: Dusty Sudie DEL, MD;  Location: [facility] OR;  Service: Open Heart Surgery;  Laterality: N/A;   PALATE / UVULA BIOPSY / EXCISION     PERICARDIAL WINDOW N/A 03/22/2020   Procedure: SUBXYPHOID POST-OP PERICARDIAL FLUID DRAINAGE WITH CHEST TUBE INSERTION.;  Surgeon: Army Dallas NOVAK, MD;  Location: [facility] OR;  Service: Thoracic;  Laterality: N/A;    REPAIR OF ACUTE ASCENDING THORACIC AORTIC DISSECTION N/A 03/11/2020   Procedure: REPAIR OF ACUTE ASCENDING THORACIC AORTIC DISSECTION USING A HEMASHIELD PLATINUM STRAIGHT GRAFT AND A HEASHIELD PLATINUM 28X10MM SINGLE ARM GRAFT;  Surgeon: Dusty Sudie DEL, MD;  Location: [facility] OR;  Service: Open Heart Surgery;  Laterality: N/A;   RIGHT/LEFT HEART CATH AND CORONARY ANGIOGRAPHY N/A 02/21/2020   Procedure: RIGHT/LEFT HEART CATH AND CORONARY ANGIOGRAPHY;  Surgeon: Burnard Debby LABOR, MD;  Location: [facility] INVASIVE [facility];  Service: Cardiovascular;  Laterality: N/A;   TEE WITHOUT CARDIOVERSION N/A 12/19/2019   Procedure: TRANSESOPHAGEAL ECHOCARDIOGRAM (TEE);  Surgeon: Lonni Slain, MD;  Location: [facility] ENDOSCOPY;  Service: Cardiovascular;  Laterality: N/A;   TEE WITHOUT CARDIOVERSION N/A 03/11/2020   Procedure: TRANSESOPHAGEAL ECHOCARDIOGRAM (TEE);  Surgeon: Dusty Sudie DEL, MD;  Location: [facility] OR;  Service: Open Heart Surgery;  Laterality: N/A;   TEE WITHOUT CARDIOVERSION N/A 03/22/2020   Procedure: TRANSESOPHAGEAL ECHOCARDIOGRAM (TEE);  Surgeon: Army Dallas NOVAK, MD;  Location: [facility] OR;  Service: Thoracic;  Laterality: N/A;   TONSILLECTOMY     VASECTOMY     Patient Active Problem List   Diagnosis Date Noted   Memory loss 04/01/2024   Gait abnormality 04/01/2024   Allergic rhinitis 11/08/2021   Presbycusis of both ears 10/21/2021   Long term (current) use of anticoagulants 04/27/2020   Atrial fibrillation (HCC) 04/27/2020   Anemia 04/21/2020   Peripheral edema  Hypothyroidism    S/P mitral valve repair 03/11/2020   S/P aortic dissection repair 03/11/2020   BPH (benign prostatic hyperplasia) 07/29/2014   Low serum vitamin D  03/14/2014   Hyperlipidemia    GERD (gastroesophageal reflux disease) 08/03/2012   Essential hypertension, benign 08/19/2008   Diverticulosis of colon 08/18/2008   Backache 08/18/2008   OSA (obstructive sleep apnea) 08/18/2008   PREMATURE VENTRICULAR CONTRACTIONS  07/23/2008   Asthma-COPD overlap syndrome 07/23/2008    ONSET DATE: years   REFERRING DIAG: R41.3 (ICD-10-CM) - Memory loss R26.9 (ICD-10-CM) - Gait abnormality  THERAPY DIAG:  No diagnosis found.  Rationale for Evaluation and Treatment: Rehabilitation  SUBJECTIVE:                                                                                                                                                                                             SUBJECTIVE STATEMENT: Patient with questions on what plan/rationale for therapy is without knowing the results of his brain MRI. Patient reports that he does not know why his neurologist sent him here. Reports that he has some shaking in his hands, changes in speech, changes in stability with walking (cites painful ingrown toenail), and I'm just weak. Falls have occurred forward and to the R and has difficulty getting up from the floor. Using the cane outside. Patient also reports longstanding dizziness.    Pt accompanied by: self  PERTINENT HISTORY: Asthma, HLD, HTN. S/p aortic dissection repair   PAIN:  Are you having pain? Reports chronic back pain since his 20's  PRECAUTIONS: Fall  RED FLAGS: None   WEIGHT BEARING RESTRICTIONS: No  FALLS: Has patient fallen in last 6 months? Yes. Number of falls 3  LIVING ENVIRONMENT: Lives with: lives with their spouse Lives in: House/apartment Stairs: 3 steps to enter with railing B; 13 steps to basement, 13 steps outside from basement  Has following equipment at home: Single point cane, Walker - 2 wheeled, and Ramped entry  PLOF: Independent and Leisure: none currently  PATIENT GOALS: patient states I'm not sure why I'm here  OBJECTIVE:      TODAY'S TREATMENT: 05/06/24 Activity Comments                          Note: Objective measures were completed at Evaluation unless otherwise noted.  DIAGNOSTIC FINDINGS: Brain MRI 04/18/24 but results not yet posted    COGNITION: Overall cognitive status: Within functional limits for tasks assessed   SENSATION: Pt denies N/T in UEs/LEs   COORDINATION: Alternating pronation/supination: slightly slowed B Alternating toe tap: WNL Finger to nose: WNL  but tremor on L   MUSCLE TONE: WNL B LEs  POSTURE: rounded shoulders, forward head, and flexed trunk     LOWER EXTREMITY ROM:     Active  Right Eval Left Eval  Hip flexion    Hip extension    Hip abduction    Hip adduction    Hip internal rotation    Hip external rotation    Knee flexion    Knee extension Lacking full extension d/t HS tightness Lacking full extension d/t HS tightness  Ankle dorsiflexion 6 4  Ankle plantarflexion    Ankle inversion    Ankle eversion     (Blank rows = not tested)  LOWER EXTREMITY MMT:    MMT (in sitting) Right Eval Left Eval  Hip flexion 4 4+  Hip extension    Hip abduction 4- 4-  Hip adduction    Hip internal rotation    Hip external rotation    Knee flexion 4+ 4+  Knee extension 4 4  Ankle dorsiflexion 5 5  Ankle plantarflexion    Ankle inversion    Ankle eversion    (Blank rows = not tested)  GAIT: Findings: Assistive device utilized:None, Level of assistance: SBA, and Comments: No arm swing, anterior wt shift onto toes with limited foot clearance and step length   FUNCTIONAL TESTS:  5 times sit to stand: 19.23 sec without armrests and difficulty to complete on last rep 10 meter walk test: 14.22 sec without AD (2.3 ft/sec)    [facility] PT Assessment - 04/22/24 0001       Standardized Balance Assessment   Standardized Balance Assessment Dynamic Gait Index      Dynamic Gait Index   Level Surface Mild Impairment    Change in Gait Speed Moderate Impairment   c/o dizziness during slow gait   Gait with Horizontal Head Turns Moderate Impairment    Gait with Vertical Head Turns Moderate Impairment    Gait and Pivot Turn Normal    Step Over Obstacle Mild Impairment    Step Around Obstacles  Moderate Impairment    Steps Normal    Total Score 14                                                                                                                                      TREATMENT DATE: 04/22/24    PATIENT EDUCATION: Education details: prognosis, POC, edu on exam findings and relevance to pt's functional impairments ; answered pt's questions on stationary bike, answered questions on rationale for therapy Person educated: Patient Education method: Explanation Education comprehension: verbalized understanding  HOME EXERCISE PROGRAM: Not yet initiated   GOALS: Goals reviewed with patient? Yes  SHORT TERM GOALS: Target date: 05/20/2024  Patient to be independent with initial HEP. Baseline: HEP initiated Goal status: IN PROGRESS    LONG TERM GOALS: Target date: 06/17/2024  Patient to be independent with advanced HEP. Baseline:  Not yet initiated  Goal status: IN PROGRESS  Patient to demonstrate B LE strength >/=4+/5.  Baseline: See above Goal status: IN PROGRESS  Patient to score at least 20/24 on DGI in order to decrease risk of falls.  Baseline: 14 Goal status: IN PROGRESS  Patient to demonstrate gait speed of at least 2.62 ft/sec in order to improve access to community.  Baseline: 2.3 f/tsec Goal status: IN PROGRESS  Patient to demonstrate 5xSTS test in <15 sec in order to decrease risk of falls.  Baseline: 5 times sit to stand: 19.23 sec without armrests and difficulty to complete on last rep Goal status: IN PROGRESS   ASSESSMENT:  CLINICAL IMPRESSION:  Patient is an 84 y/o M presenting to OPPT with c/o changes in gait and balance and having falls. Patient today presenting with Slowed UE coordination testing, rounded posture, HS/gastroc tightness, hip weakness, gait deviations, and impaired balance. Would benefit from skilled PT services 1-2 x/week for 8 weeks to address aforementioned impairments in order to optimize level of function.     OBJECTIVE IMPAIRMENTS: Abnormal gait, decreased balance, decreased mobility, decreased ROM, decreased strength, dizziness, impaired flexibility, and postural dysfunction.   ACTIVITY LIMITATIONS: carrying, lifting, bending, standing, squatting, sleeping, stairs, transfers, bathing, toileting, dressing, reach over head, locomotion level, and caring for others  PARTICIPATION LIMITATIONS: meal prep, cleaning, laundry, shopping, community activity, and church  PERSONAL FACTORS: Age, Past/current experiences, Time since onset of injury/illness/exacerbation, and 3+ comorbidities: Asthma, HLD, HTN. S/p aortic dissection repair  are also affecting patient's functional outcome.   REHAB POTENTIAL: Good  CLINICAL DECISION MAKING: Evolving/moderate complexity  EVALUATION COMPLEXITY: Moderate  PLAN:  PT FREQUENCY: 1-2x/week  PT DURATION: 8 weeks  PLANNED INTERVENTIONS: 97164- PT Re-evaluation, 97110-Therapeutic exercises, 97530- Therapeutic activity, 97112- Neuromuscular re-education, 97535- Self Care, [id number]- Manual therapy, [phone]- Gait training, [phone]- Canalith repositioning, [phone] (1-2 muscles), [id number] (3+ muscles)- Dry Needling, Patient/Family education, Balance training, Stair training, Taping, Joint mobilization, Spinal mobilization, Cryotherapy, and Moist heat  PLAN FOR NEXT SESSION: initiate HEP for functional strengthening, arm swing for gait; inquire more about dizziness, LE stretching, posture    Louana Terrilyn Christians, PT, DPT 05/03/24 1:17 PM  [facility] at [facility] [address], [address] [place], [place] [address] Phone # [phone] Fax # [phone]        "

## 2024-05-06 ENCOUNTER — Ambulatory Visit: Admitting: Physical Therapy

## 2024-05-09 ENCOUNTER — Ambulatory Visit

## 2024-05-09 DIAGNOSIS — R293 Abnormal posture: Secondary | ICD-10-CM

## 2024-05-09 DIAGNOSIS — R2681 Unsteadiness on feet: Secondary | ICD-10-CM

## 2024-05-09 DIAGNOSIS — R2689 Other abnormalities of gait and mobility: Secondary | ICD-10-CM

## 2024-05-09 DIAGNOSIS — M6281 Muscle weakness (generalized): Secondary | ICD-10-CM

## 2024-05-09 NOTE — Therapy (Signed)
 " OUTPATIENT PHYSICAL THERAPY NEURO TREATMENT   Patient Name: Patrick Bell MRN: [id number] DOB:26-Oct-1940, 84 y.o., male Today's Date: 05/09/2024   PCP:    Dettinger, Fonda LABOR, MD       REFERRING PROVIDER: Onita Duos, MD  END OF SESSION:  PT End of Session - 05/09/24 1311     Visit Number 2    Number of Visits 17    Date for Recertification  06/17/24    Authorization Type [employer]    Authorization Time Period auth submitted    PT Start Time 1315    PT Stop Time 1400    PT Time Calculation (min) 45 min    Equipment Utilized During Treatment Gait belt    Activity Tolerance Patient tolerated treatment well    Behavior During Therapy WFL for tasks assessed/performed          Past Medical History:  Diagnosis Date   Acute thoracic aortic dissection (HCC) 03/11/2020   Type A   Allergy    Asthma    Cataract    Cataract    Diverticulitis    Dyspnea    GERD (gastroesophageal reflux disease)    Heart murmur 12/2019   Hyperlipidemia    Hypertension    Inguinal hernia bilateral, non-recurrent    S/P aortic dissection repair 03/11/2020   supracoronary straight graft repair with resuspension of native aortic valve and open distal anastomosis for intraoperative acute type A aortic dissection    S/P mitral valve repair 03/11/2020   Complex valvuloplasty including artificial Gore-tex neochord placement x 4, suture plication of posterior leaflet and posteromedial commissure and 32 mm Sorin Memo 4D ring annuloplasty   Sleep apnea    Past Surgical History:  Procedure Laterality Date   bilateral inguinal hernia     CARDIAC CATHETERIZATION Bilateral 02/21/2020   CHEST TUBE INSERTION N/A 03/22/2020   Procedure: CHEST TUBE INSERTION OF 28 BLAKE DRAIN.;  Surgeon: Army Dallas NOVAK, MD;  Location: [facility] OR;  Service: Thoracic;  Laterality: N/A;   COLONOSCOPY  06/09/2004   WLM:Dphfnpi colon diverticulosis, otherwise normal colonoscopy   EYE SURGERY  08/2014   HERNIA REPAIR      MITRAL VALVE REPAIR N/A 03/11/2020   Procedure: MITRAL VALVE REPAIR (MVR) USING MEMO 4D SIZE 32 RING;  Surgeon: Dusty Sudie DEL, MD;  Location: [facility] OR;  Service: Open Heart Surgery;  Laterality: N/A;   PALATE / UVULA BIOPSY / EXCISION     PERICARDIAL WINDOW N/A 03/22/2020   Procedure: SUBXYPHOID POST-OP PERICARDIAL FLUID DRAINAGE WITH CHEST TUBE INSERTION.;  Surgeon: Army Dallas NOVAK, MD;  Location: [facility] OR;  Service: Thoracic;  Laterality: N/A;   REPAIR OF ACUTE ASCENDING THORACIC AORTIC DISSECTION N/A 03/11/2020   Procedure: REPAIR OF ACUTE ASCENDING THORACIC AORTIC DISSECTION USING A HEMASHIELD PLATINUM STRAIGHT GRAFT AND A HEASHIELD PLATINUM 28X10MM SINGLE ARM GRAFT;  Surgeon: Dusty Sudie DEL, MD;  Location: [facility] OR;  Service: Open Heart Surgery;  Laterality: N/A;   RIGHT/LEFT HEART CATH AND CORONARY ANGIOGRAPHY N/A 02/21/2020   Procedure: RIGHT/LEFT HEART CATH AND CORONARY ANGIOGRAPHY;  Surgeon: Burnard Debby LABOR, MD;  Location: [facility] INVASIVE [facility] LAB;  Service: Cardiovascular;  Laterality: N/A;   TEE WITHOUT CARDIOVERSION N/A 12/19/2019   Procedure: TRANSESOPHAGEAL ECHOCARDIOGRAM (TEE);  Surgeon: Lonni Slain, MD;  Location: [facility] ENDOSCOPY;  Service: Cardiovascular;  Laterality: N/A;   TEE WITHOUT CARDIOVERSION N/A 03/11/2020   Procedure: TRANSESOPHAGEAL ECHOCARDIOGRAM (TEE);  Surgeon: Dusty Sudie DEL, MD;  Location: [facility] OR;  Service: Open Heart  Surgery;  Laterality: N/A;   TEE WITHOUT CARDIOVERSION N/A 03/22/2020   Procedure: TRANSESOPHAGEAL ECHOCARDIOGRAM (TEE);  Surgeon: Army Dallas NOVAK, MD;  Location: [facility] OR;  Service: Thoracic;  Laterality: N/A;   TONSILLECTOMY     VASECTOMY     Patient Active Problem List   Diagnosis Date Noted   Memory loss 04/01/2024   Gait abnormality 04/01/2024   Allergic rhinitis 11/08/2021   Presbycusis of both ears 10/21/2021   Long term (current) use of anticoagulants 04/27/2020   Atrial fibrillation (HCC) 04/27/2020   Anemia 04/21/2020   Peripheral  edema    Hypothyroidism    S/P mitral valve repair 03/11/2020   S/P aortic dissection repair 03/11/2020   BPH (benign prostatic hyperplasia) 07/29/2014   Low serum vitamin D  03/14/2014   Hyperlipidemia    GERD (gastroesophageal reflux disease) 08/03/2012   Essential hypertension, benign 08/19/2008   Diverticulosis of colon 08/18/2008   Backache 08/18/2008   OSA (obstructive sleep apnea) 08/18/2008   PREMATURE VENTRICULAR CONTRACTIONS 07/23/2008   Asthma-COPD overlap syndrome 07/23/2008    ONSET DATE: years   REFERRING DIAG: R41.3 (ICD-10-CM) - Memory loss R26.9 (ICD-10-CM) - Gait abnormality  THERAPY DIAG:  Other abnormalities of gait and mobility  Unsteadiness on feet  Abnormal posture  Muscle weakness (generalized)  Rationale for Evaluation and Treatment: Rehabilitation  SUBJECTIVE:                                                                                                                                                                                             SUBJECTIVE STATEMENT: Pt returns with questions as to why he has been referred to PT.  Reports typically dizziness is provoked by position change from supine to sitting/standing noting more lightheadedness and speculates it is BP related as it does not occur consistently. Have a degree of back pain all the time, typically when arising from sitting there is bilateral groin pain   Pt accompanied by: self  PERTINENT HISTORY: Asthma, HLD, HTN. S/p aortic dissection repair   PAIN:  Are you having pain? Reports chronic back pain since his 20's  PRECAUTIONS: Fall  RED FLAGS: None   WEIGHT BEARING RESTRICTIONS: No  FALLS: Has patient fallen in last 6 months? Yes. Number of falls 3  LIVING ENVIRONMENT: Lives with: lives with their spouse Lives in: House/apartment Stairs: 3 steps to enter with railing B; 13 steps to basement, 13 steps outside from basement  Has following equipment at home: Single point  cane, Walker - 2 wheeled, and Ramped entry  PLOF: Independent and Leisure: none currently  PATIENT GOALS: patient states I'm not sure why I'm  here  OBJECTIVE:   TODAY'S TREATMENT: 05/09/24 Activity Comments  Vitals sitting: 136/67 mmHg, 76 bpm   Discussion of rationale of PT in regards to LBP, balance, flexibility   Discussion of resources for exercise, e.g. Silversneakers, Youtube, Zoom class, etc. Pt has [employer] device at home and demo various youtube tutorials that would be appropriate for upper body strength Endorses increased LBP with prolonged standing/walking and notes improved symptoms when walking in store wit grocery cart, demo of anatomy and reason of symptoms of stenosis, DDD, etc and walking with AD to promote some trunk flexion to increase time/distance  HEP development See below         PATIENT EDUCATION: Education details:see above Person educated: Patient Education method: Explanation Education comprehension: verbalized understanding  HOME EXERCISE PROGRAM: Access Code: [email or web address] URL: [email or web address] Date: 05/09/2024 Prepared by: Kelly Masiah Woody  Exercises - Reverse Lunge  - 2-3 x weekly - 1-3 sets - 5-10 reps - Standing 'L' Stretch at Counter  - 1 x daily - 7 x weekly - 1-3 sets - 5-10 reps - Feet Together Balance at [employer] Eyes Closed  - 1 x daily - 7 x weekly - 3 sets - 30 sec hold - Semi-Tandem Balance at [employer] Eyes Open  - 1 x daily - 7 x weekly - 3 sets - 15-30 sec hold Note: Objective measures were completed at Evaluation unless otherwise noted.  DIAGNOSTIC FINDINGS: Brain MRI 04/18/24 but results not yet posted   COGNITION: Overall cognitive status: Within functional limits for tasks assessed   SENSATION: Pt denies N/T in UEs/LEs   COORDINATION: Alternating pronation/supination: slightly slowed B Alternating toe tap: WNL Finger to nose: WNL but tremor on L   MUSCLE TONE: WNL B LEs  POSTURE: rounded shoulders,  forward head, and flexed trunk     LOWER EXTREMITY ROM:     Active  Right Eval Left Eval  Hip flexion    Hip extension    Hip abduction    Hip adduction    Hip internal rotation    Hip external rotation    Knee flexion    Knee extension Lacking full extension d/t HS tightness Lacking full extension d/t HS tightness  Ankle dorsiflexion 6 4  Ankle plantarflexion    Ankle inversion    Ankle eversion     (Blank rows = not tested)  LOWER EXTREMITY MMT:    MMT (in sitting) Right Eval Left Eval  Hip flexion 4 4+  Hip extension    Hip abduction 4- 4-  Hip adduction    Hip internal rotation    Hip external rotation    Knee flexion 4+ 4+  Knee extension 4 4  Ankle dorsiflexion 5 5  Ankle plantarflexion    Ankle inversion    Ankle eversion    (Blank rows = not tested)  GAIT: Findings: Assistive device utilized:None, Level of assistance: SBA, and Comments: No arm swing, anterior wt shift onto toes with limited foot clearance and step length   FUNCTIONAL TESTS:  5 times sit to stand: 19.23 sec without armrests and difficulty to complete on last rep 10 meter walk test: 14.22 sec without AD (2.3 ft/sec)    [facility] PT Assessment - 04/22/24 0001       Standardized Balance Assessment   Standardized Balance Assessment Dynamic Gait Index      Dynamic Gait Index   Level Surface Mild Impairment    Change in Gait Speed Moderate Impairment   c/o dizziness during  slow gait   Gait with Horizontal Head Turns Moderate Impairment    Gait with Vertical Head Turns Moderate Impairment    Gait and Pivot Turn Normal    Step Over Obstacle Mild Impairment    Step Around Obstacles Moderate Impairment    Steps Normal    Total Score 14                                                                                                                                      TREATMENT DATE: 04/22/24      GOALS: Goals reviewed with patient? Yes  SHORT TERM GOALS: Target date:  05/20/2024  Patient to be independent with initial HEP. Baseline: HEP initiated Goal status: INITIAL    LONG TERM GOALS: Target date: 06/17/2024  Patient to be independent with advanced HEP. Baseline: Not yet initiated  Goal status: INITIAL  Patient to demonstrate B LE strength >/=4+/5.  Baseline: See above Goal status: INITIAL  Patient to score at least 20/24 on DGI in order to decrease risk of falls.  Baseline: 14 Goal status: INITIAL  Patient to demonstrate gait speed of at least 2.62 ft/sec in order to improve access to community.  Baseline: 2.3 f/tsec Goal status: INITIAL  Patient to demonstrate 5xSTS test in <15 sec in order to decrease risk of falls.  Baseline: 5 times sit to stand: 19.23 sec without armrests and difficulty to complete on last rep Goal status: INITIAL   ASSESSMENT:  CLINICAL IMPRESSION: Pt with questions regarding rationale of PT intervention and reinforced findings from initial evaluation as well as dicsussed benefits of targeted activity to improve function and address deficits and limitations. Instructed in various resources that could be of use to increase activity levels as he endorses a largely sedentary lifestyle. Initiated development of multimodal HEP to include element of LE strength/mobility practice (reverse lunge for floor to stand), standing flexibility/stretching to decrease LBP and c/o stiffness, and multisensory static balance to improve postural control/proprioceptive awareness.  Good tolerance and performance to activities requiring frequent cues for body mechanics with reverse lunge and reassurance for balance activities as his perceived postural sway is incongruent with actual performance with improved performance in each following coaching.  Continued sessions would be beneficial to progress POC details    OBJECTIVE IMPAIRMENTS: Abnormal gait, decreased balance, decreased mobility, decreased ROM, decreased strength, dizziness, impaired  flexibility, and postural dysfunction.   ACTIVITY LIMITATIONS: carrying, lifting, bending, standing, squatting, sleeping, stairs, transfers, bathing, toileting, dressing, reach over head, locomotion level, and caring for others  PARTICIPATION LIMITATIONS: meal prep, cleaning, laundry, shopping, community activity, and church  PERSONAL FACTORS: Age, Past/current experiences, Time since onset of injury/illness/exacerbation, and 3+ comorbidities: Asthma, HLD, HTN. S/p aortic dissection repair  are also affecting patient's functional outcome.   REHAB POTENTIAL: Good  CLINICAL DECISION MAKING: Evolving/moderate complexity  EVALUATION COMPLEXITY: Moderate  PLAN:  PT FREQUENCY: 1-2x/week  PT DURATION: 8 weeks  PLANNED INTERVENTIONS: 97164- PT Re-evaluation, 97110-Therapeutic exercises, 97530- Therapeutic activity, [id number]- Neuromuscular re-education, 97535- Self Care, [id number]- Manual therapy, [phone]- Gait training, [phone]- Canalith repositioning, [phone] (1-2 muscles), [phone] (3+ muscles)- Dry Needling, Patient/Family education, Balance training, Stair training, Taping, Joint mobilization, Spinal mobilization, Cryotherapy, and Moist heat  PLAN FOR NEXT SESSION: initiate HEP for functional strengthening, arm swing for gait; inquire more about dizziness, LE stretching, posture    3:19 PM, 05/09/24 M. Kelly Waris Rodger, PT, DPT Physical Therapist- [place] Office Number: [phone]        "

## 2024-05-10 NOTE — Therapy (Incomplete)
 " OUTPATIENT PHYSICAL THERAPY NEURO TREATMENT   Patient Name: Patrick Bell MRN: [id number] DOB:[birth year], 84 y.o., male Today's Date: 05/10/2024   PCP:    Dettinger, Fonda LABOR, MD       REFERRING PROVIDER: Onita Duos, MD  END OF SESSION:    Past Medical History:  Diagnosis Date   Acute thoracic aortic dissection (HCC) 03/11/2020   Type A   Allergy    Asthma    Cataract    Cataract    Diverticulitis    Dyspnea    GERD (gastroesophageal reflux disease)    Heart murmur 12/2019   Hyperlipidemia    Hypertension    Inguinal hernia bilateral, non-recurrent    S/P aortic dissection repair 03/11/2020   supracoronary straight graft repair with resuspension of native aortic valve and open distal anastomosis for intraoperative acute type A aortic dissection    S/P mitral valve repair 03/11/2020   Complex valvuloplasty including artificial Gore-tex neochord placement x 4, suture plication of posterior leaflet and posteromedial commissure and 32 mm Sorin Memo 4D ring annuloplasty   Sleep apnea    Past Surgical History:  Procedure Laterality Date   bilateral inguinal hernia     CARDIAC CATHETERIZATION Bilateral 02/21/2020   CHEST TUBE INSERTION N/A 03/22/2020   Procedure: CHEST TUBE INSERTION OF 28 BLAKE DRAIN.;  Surgeon: Army Dallas NOVAK, MD;  Location: [facility] OR;  Service: Thoracic;  Laterality: N/A;   COLONOSCOPY  06/09/2004   WLM:Dphfnpi colon diverticulosis, otherwise normal colonoscopy   EYE SURGERY  08/2014   HERNIA REPAIR     MITRAL VALVE REPAIR N/A 03/11/2020   Procedure: MITRAL VALVE REPAIR (MVR) USING MEMO 4D SIZE 32 RING;  Surgeon: Dusty Sudie DEL, MD;  Location: [facility] OR;  Service: Open Heart Surgery;  Laterality: N/A;   PALATE / UVULA BIOPSY / EXCISION     PERICARDIAL WINDOW N/A 03/22/2020   Procedure: SUBXYPHOID POST-OP PERICARDIAL FLUID DRAINAGE WITH CHEST TUBE INSERTION.;  Surgeon: Army Dallas NOVAK, MD;  Location: [facility] OR;  Service: Thoracic;  Laterality: N/A;    REPAIR OF ACUTE ASCENDING THORACIC AORTIC DISSECTION N/A 03/11/2020   Procedure: REPAIR OF ACUTE ASCENDING THORACIC AORTIC DISSECTION USING A HEMASHIELD PLATINUM STRAIGHT GRAFT AND A HEASHIELD PLATINUM 28X10MM SINGLE ARM GRAFT;  Surgeon: Dusty Sudie DEL, MD;  Location: [facility] OR;  Service: Open Heart Surgery;  Laterality: N/A;   RIGHT/LEFT HEART CATH AND CORONARY ANGIOGRAPHY N/A 02/21/2020   Procedure: RIGHT/LEFT HEART CATH AND CORONARY ANGIOGRAPHY;  Surgeon: Burnard Debby LABOR, MD;  Location: [facility] INVASIVE [facility] LAB;  Service: Cardiovascular;  Laterality: N/A;   TEE WITHOUT CARDIOVERSION N/A 12/19/2019   Procedure: TRANSESOPHAGEAL ECHOCARDIOGRAM (TEE);  Surgeon: Lonni Slain, MD;  Location: [facility] ENDOSCOPY;  Service: Cardiovascular;  Laterality: N/A;   TEE WITHOUT CARDIOVERSION N/A 03/11/2020   Procedure: TRANSESOPHAGEAL ECHOCARDIOGRAM (TEE);  Surgeon: Dusty Sudie DEL, MD;  Location: [facility] OR;  Service: Open Heart Surgery;  Laterality: N/A;   TEE WITHOUT CARDIOVERSION N/A 03/22/2020   Procedure: TRANSESOPHAGEAL ECHOCARDIOGRAM (TEE);  Surgeon: Army Dallas NOVAK, MD;  Location: [facility] OR;  Service: Thoracic;  Laterality: N/A;   TONSILLECTOMY     VASECTOMY     Patient Active Problem List   Diagnosis Date Noted   Memory loss 04/01/2024   Gait abnormality 04/01/2024   Allergic rhinitis 11/08/2021   Presbycusis of both ears 10/21/2021   Long term (current) use of anticoagulants 04/27/2020   Atrial fibrillation (HCC) 04/27/2020   Anemia 04/21/2020   Peripheral edema  Hypothyroidism    S/P mitral valve repair 03/11/2020   S/P aortic dissection repair 03/11/2020   BPH (benign prostatic hyperplasia) 07/29/2014   Low serum vitamin D  03/14/2014   Hyperlipidemia    GERD (gastroesophageal reflux disease) 08/03/2012   Essential hypertension, benign 08/19/2008   Diverticulosis of colon 08/18/2008   Backache 08/18/2008   OSA (obstructive sleep apnea) 08/18/2008   PREMATURE VENTRICULAR CONTRACTIONS  07/23/2008   Asthma-COPD overlap syndrome 07/23/2008    ONSET DATE: years   REFERRING DIAG: R41.3 (ICD-10-CM) - Memory loss R26.9 (ICD-10-CM) - Gait abnormality  THERAPY DIAG:  No diagnosis found.  Rationale for Evaluation and Treatment: Rehabilitation  SUBJECTIVE:                                                                                                                                                                                             SUBJECTIVE STATEMENT: Pt returns with questions as to why he has been referred to PT.  Reports typically dizziness is provoked by position change from supine to sitting/standing noting more lightheadedness and speculates it is BP related as it does not occur consistently. Have a degree of back pain all the time, typically when arising from sitting there is bilateral groin pain   Pt accompanied by: self  PERTINENT HISTORY: Asthma, HLD, HTN. S/p aortic dissection repair   PAIN:  Are you having pain? Reports chronic back pain since his 20's  PRECAUTIONS: Fall  RED FLAGS: None   WEIGHT BEARING RESTRICTIONS: No  FALLS: Has patient fallen in last 6 months? Yes. Number of falls 3  LIVING ENVIRONMENT: Lives with: lives with their spouse Lives in: House/apartment Stairs: 3 steps to enter with railing B; 13 steps to basement, 13 steps outside from basement  Has following equipment at home: Single point cane, Walker - 2 wheeled, and Ramped entry  PLOF: Independent and Leisure: none currently  PATIENT GOALS: patient states I'm not sure why I'm here  OBJECTIVE:      TODAY'S TREATMENT: 05/13/24 Activity Comments                           TODAY'S TREATMENT: 05/09/24 Activity Comments  Vitals sitting: 136/67 mmHg, 76 bpm   Discussion of rationale of PT in regards to LBP, balance, flexibility   Discussion of resources for exercise, e.g. Silversneakers, Youtube, Zoom class, etc. Pt has [employer] device at home and demo  various youtube tutorials that would be appropriate for upper body strength Endorses increased LBP with prolonged standing/walking and notes improved symptoms when walking in store wit grocery cart, demo of  anatomy and reason of symptoms of stenosis, DDD, etc and walking with AD to promote some trunk flexion to increase time/distance  HEP development See below         PATIENT EDUCATION: Education details:see above Person educated: Patient Education method: Explanation Education comprehension: verbalized understanding  HOME EXERCISE PROGRAM: Access Code: [email or web address] URL: [email or web address] Date: 05/09/2024 Prepared by: Kelly Halpin  Exercises - Reverse Lunge  - 2-3 x weekly - 1-3 sets - 5-10 reps - Standing 'L' Stretch at Counter  - 1 x daily - 7 x weekly - 1-3 sets - 5-10 reps - Feet Together Balance at [employer] Eyes Closed  - 1 x daily - 7 x weekly - 3 sets - 30 sec hold - Semi-Tandem Balance at [employer] Eyes Open  - 1 x daily - 7 x weekly - 3 sets - 15-30 sec hold Note: Objective measures were completed at Evaluation unless otherwise noted.  DIAGNOSTIC FINDINGS: Brain MRI 04/18/24 but results not yet posted   COGNITION: Overall cognitive status: Within functional limits for tasks assessed   SENSATION: Pt denies N/T in UEs/LEs   COORDINATION: Alternating pronation/supination: slightly slowed B Alternating toe tap: WNL Finger to nose: WNL but tremor on L   MUSCLE TONE: WNL B LEs  POSTURE: rounded shoulders, forward head, and flexed trunk     LOWER EXTREMITY ROM:     Active  Right Eval Left Eval  Hip flexion    Hip extension    Hip abduction    Hip adduction    Hip internal rotation    Hip external rotation    Knee flexion    Knee extension Lacking full extension d/t HS tightness Lacking full extension d/t HS tightness  Ankle dorsiflexion 6 4  Ankle plantarflexion    Ankle inversion    Ankle eversion     (Blank rows = not tested)  LOWER  EXTREMITY MMT:    MMT (in sitting) Right Eval Left Eval  Hip flexion 4 4+  Hip extension    Hip abduction 4- 4-  Hip adduction    Hip internal rotation    Hip external rotation    Knee flexion 4+ 4+  Knee extension 4 4  Ankle dorsiflexion 5 5  Ankle plantarflexion    Ankle inversion    Ankle eversion    (Blank rows = not tested)  GAIT: Findings: Assistive device utilized:None, Level of assistance: SBA, and Comments: No arm swing, anterior wt shift onto toes with limited foot clearance and step length   FUNCTIONAL TESTS:  5 times sit to stand: 19.23 sec without armrests and difficulty to complete on last rep 10 meter walk test: 14.22 sec without AD (2.3 ft/sec)    [facility] PT Assessment - 04/22/24 0001       Standardized Balance Assessment   Standardized Balance Assessment Dynamic Gait Index      Dynamic Gait Index   Level Surface Mild Impairment    Change in Gait Speed Moderate Impairment   c/o dizziness during slow gait   Gait with Horizontal Head Turns Moderate Impairment    Gait with Vertical Head Turns Moderate Impairment    Gait and Pivot Turn Normal    Step Over Obstacle Mild Impairment    Step Around Obstacles Moderate Impairment    Steps Normal    Total Score 14  TREATMENT DATE: 04/22/24      GOALS: Goals reviewed with patient? Yes  SHORT TERM GOALS: Target date: 05/20/2024  Patient to be independent with initial HEP. Baseline: HEP initiated Goal status: INITIAL    LONG TERM GOALS: Target date: 06/17/2024  Patient to be independent with advanced HEP. Baseline: Not yet initiated  Goal status: INITIAL  Patient to demonstrate B LE strength >/=4+/5.  Baseline: See above Goal status: INITIAL  Patient to score at least 20/24 on DGI in order to decrease risk of falls.  Baseline: 14 Goal status: INITIAL  Patient to  demonstrate gait speed of at least 2.62 ft/sec in order to improve access to community.  Baseline: 2.3 f/tsec Goal status: INITIAL  Patient to demonstrate 5xSTS test in <15 sec in order to decrease risk of falls.  Baseline: 5 times sit to stand: 19.23 sec without armrests and difficulty to complete on last rep Goal status: INITIAL   ASSESSMENT:  CLINICAL IMPRESSION: Pt with questions regarding rationale of PT intervention and reinforced findings from initial evaluation as well as dicsussed benefits of targeted activity to improve function and address deficits and limitations. Instructed in various resources that could be of use to increase activity levels as he endorses a largely sedentary lifestyle. Initiated development of multimodal HEP to include element of LE strength/mobility practice (reverse lunge for floor to stand), standing flexibility/stretching to decrease LBP and c/o stiffness, and multisensory static balance to improve postural control/proprioceptive awareness.  Good tolerance and performance to activities requiring frequent cues for body mechanics with reverse lunge and reassurance for balance activities as his perceived postural sway is incongruent with actual performance with improved performance in each following coaching.  Continued sessions would be beneficial to progress POC details    OBJECTIVE IMPAIRMENTS: Abnormal gait, decreased balance, decreased mobility, decreased ROM, decreased strength, dizziness, impaired flexibility, and postural dysfunction.   ACTIVITY LIMITATIONS: carrying, lifting, bending, standing, squatting, sleeping, stairs, transfers, bathing, toileting, dressing, reach over head, locomotion level, and caring for others  PARTICIPATION LIMITATIONS: meal prep, cleaning, laundry, shopping, community activity, and church  PERSONAL FACTORS: Age, Past/current experiences, Time since onset of injury/illness/exacerbation, and 3+ comorbidities: Asthma, HLD, HTN.  S/p aortic dissection repair  are also affecting patient's functional outcome.   REHAB POTENTIAL: Good  CLINICAL DECISION MAKING: Evolving/moderate complexity  EVALUATION COMPLEXITY: Moderate  PLAN:  PT FREQUENCY: 1-2x/week  PT DURATION: 8 weeks  PLANNED INTERVENTIONS: 97164- PT Re-evaluation, 97110-Therapeutic exercises, 97530- Therapeutic activity, [id number]- Neuromuscular re-education, 97535- Self Care, [id number]- Manual therapy, [id number]- Gait training, [phone]- Canalith repositioning, [phone] (1-2 muscles), [phone] (3+ muscles)- Dry Needling, Patient/Family education, Balance training, Stair training, Taping, Joint mobilization, Spinal mobilization, Cryotherapy, and Moist heat  PLAN FOR NEXT SESSION: initiate HEP for functional strengthening, arm swing for gait; inquire more about dizziness, LE stretching, posture        "

## 2024-05-13 ENCOUNTER — Ambulatory Visit: Admitting: Physical Therapy

## 2024-05-16 ENCOUNTER — Ambulatory Visit

## 2024-05-20 ENCOUNTER — Ambulatory Visit: Admitting: Physical Therapy

## 2024-05-21 ENCOUNTER — Ambulatory Visit

## 2024-05-23 ENCOUNTER — Ambulatory Visit

## 2024-05-28 ENCOUNTER — Encounter: Admitting: Pulmonary Disease

## 2024-06-11 ENCOUNTER — Ambulatory Visit: Admitting: Cardiology

## 2024-10-08 ENCOUNTER — Ambulatory Visit: Admitting: Neurology
